# Patient Record
Sex: Male | Born: 1950 | Race: White | Hispanic: No | Smoking: Former smoker
Health system: Southern US, Community
[De-identification: ages and names within clinical notes are randomized; demographics above are authoritative.]

## PROBLEM LIST (undated history)

## (undated) DIAGNOSIS — I2510 Atherosclerotic heart disease of native coronary artery without angina pectoris: Secondary | ICD-10-CM

## (undated) DIAGNOSIS — I739 Peripheral vascular disease, unspecified: Secondary | ICD-10-CM

## (undated) DIAGNOSIS — J449 Chronic obstructive pulmonary disease, unspecified: Secondary | ICD-10-CM

## (undated) DIAGNOSIS — F17210 Nicotine dependence, cigarettes, uncomplicated: Secondary | ICD-10-CM

## (undated) DIAGNOSIS — E11319 Type 2 diabetes mellitus with unspecified diabetic retinopathy without macular edema: Secondary | ICD-10-CM

## (undated) DIAGNOSIS — I10 Essential (primary) hypertension: Secondary | ICD-10-CM

## (undated) DIAGNOSIS — C801 Malignant (primary) neoplasm, unspecified: Secondary | ICD-10-CM

## (undated) DIAGNOSIS — I219 Acute myocardial infarction, unspecified: Secondary | ICD-10-CM

## (undated) DIAGNOSIS — E785 Hyperlipidemia, unspecified: Secondary | ICD-10-CM

## (undated) HISTORY — PX: CARDIAC CATHETERIZATION: SHX172

## (undated) HISTORY — DX: Peripheral vascular disease, unspecified: I73.9

## (undated) HISTORY — DX: Nicotine dependence, cigarettes, uncomplicated: F17.210

## (undated) HISTORY — DX: Hyperlipidemia, unspecified: E78.5

## (undated) HISTORY — DX: Atherosclerotic heart disease of native coronary artery without angina pectoris: I25.10

## (undated) HISTORY — DX: Essential (primary) hypertension: I10

## (undated) HISTORY — PX: FRACTURE SURGERY: SHX138

---

## 1999-07-15 ENCOUNTER — Encounter: Admission: RE | Admit: 1999-07-15 | Discharge: 1999-10-13 | Payer: Self-pay | Admitting: Orthopedic Surgery

## 1999-08-05 ENCOUNTER — Encounter: Payer: Self-pay | Admitting: Orthopedic Surgery

## 1999-09-02 ENCOUNTER — Encounter: Payer: Self-pay | Admitting: Orthopedic Surgery

## 1999-09-30 ENCOUNTER — Encounter: Payer: Self-pay | Admitting: Orthopedic Surgery

## 1999-10-27 ENCOUNTER — Encounter: Admission: RE | Admit: 1999-10-27 | Discharge: 2000-01-25 | Payer: Self-pay | Admitting: Orthopedic Surgery

## 1999-11-25 ENCOUNTER — Encounter: Payer: Self-pay | Admitting: Orthopedic Surgery

## 2000-01-21 ENCOUNTER — Ambulatory Visit (HOSPITAL_COMMUNITY): Admission: RE | Admit: 2000-01-21 | Discharge: 2000-01-21 | Payer: Self-pay | Admitting: Internal Medicine

## 2000-03-01 ENCOUNTER — Encounter: Admission: RE | Admit: 2000-03-01 | Discharge: 2000-05-30 | Payer: Self-pay | Admitting: Orthopedic Surgery

## 2000-06-08 ENCOUNTER — Encounter: Admission: RE | Admit: 2000-06-08 | Discharge: 2000-09-06 | Payer: Self-pay | Admitting: Orthopedic Surgery

## 2000-09-15 ENCOUNTER — Encounter (HOSPITAL_BASED_OUTPATIENT_CLINIC_OR_DEPARTMENT_OTHER): Payer: Self-pay | Admitting: Internal Medicine

## 2000-09-15 ENCOUNTER — Encounter: Admission: RE | Admit: 2000-09-15 | Discharge: 2000-12-14 | Payer: Self-pay | Admitting: Internal Medicine

## 2001-04-25 ENCOUNTER — Emergency Department (HOSPITAL_COMMUNITY): Admission: EM | Admit: 2001-04-25 | Discharge: 2001-04-25 | Payer: Self-pay | Admitting: Emergency Medicine

## 2001-04-27 ENCOUNTER — Encounter: Admission: RE | Admit: 2001-04-27 | Discharge: 2001-07-11 | Payer: Self-pay | Admitting: Internal Medicine

## 2001-05-18 ENCOUNTER — Encounter (HOSPITAL_BASED_OUTPATIENT_CLINIC_OR_DEPARTMENT_OTHER): Payer: Self-pay | Admitting: Internal Medicine

## 2001-06-22 ENCOUNTER — Encounter (HOSPITAL_BASED_OUTPATIENT_CLINIC_OR_DEPARTMENT_OTHER): Payer: Self-pay | Admitting: Internal Medicine

## 2001-07-27 ENCOUNTER — Encounter: Admission: RE | Admit: 2001-07-27 | Discharge: 2001-08-23 | Payer: Self-pay | Admitting: Internal Medicine

## 2001-08-25 ENCOUNTER — Encounter: Admission: RE | Admit: 2001-08-25 | Discharge: 2001-11-23 | Payer: Self-pay | Admitting: Orthopedic Surgery

## 2001-09-14 ENCOUNTER — Encounter (HOSPITAL_BASED_OUTPATIENT_CLINIC_OR_DEPARTMENT_OTHER): Payer: Self-pay | Admitting: Internal Medicine

## 2001-10-26 ENCOUNTER — Encounter (HOSPITAL_BASED_OUTPATIENT_CLINIC_OR_DEPARTMENT_OTHER): Payer: Self-pay | Admitting: Internal Medicine

## 2001-11-30 ENCOUNTER — Encounter (HOSPITAL_BASED_OUTPATIENT_CLINIC_OR_DEPARTMENT_OTHER): Admission: RE | Admit: 2001-11-30 | Discharge: 2002-02-27 | Payer: Self-pay | Admitting: Internal Medicine

## 2001-11-30 ENCOUNTER — Ambulatory Visit (HOSPITAL_COMMUNITY): Admission: RE | Admit: 2001-11-30 | Discharge: 2001-11-30 | Payer: Self-pay | Admitting: Internal Medicine

## 2001-11-30 ENCOUNTER — Encounter (HOSPITAL_BASED_OUTPATIENT_CLINIC_OR_DEPARTMENT_OTHER): Payer: Self-pay | Admitting: Internal Medicine

## 2002-02-27 ENCOUNTER — Encounter (HOSPITAL_BASED_OUTPATIENT_CLINIC_OR_DEPARTMENT_OTHER): Admission: RE | Admit: 2002-02-27 | Discharge: 2002-03-03 | Payer: Self-pay | Admitting: Internal Medicine

## 2002-03-01 ENCOUNTER — Ambulatory Visit (HOSPITAL_COMMUNITY): Admission: RE | Admit: 2002-03-01 | Discharge: 2002-03-01 | Payer: Self-pay | Admitting: Internal Medicine

## 2002-03-01 ENCOUNTER — Encounter (HOSPITAL_BASED_OUTPATIENT_CLINIC_OR_DEPARTMENT_OTHER): Payer: Self-pay | Admitting: Internal Medicine

## 2002-05-24 ENCOUNTER — Encounter (HOSPITAL_BASED_OUTPATIENT_CLINIC_OR_DEPARTMENT_OTHER): Admission: RE | Admit: 2002-05-24 | Discharge: 2002-08-22 | Payer: Self-pay | Admitting: Internal Medicine

## 2002-08-28 ENCOUNTER — Encounter (HOSPITAL_BASED_OUTPATIENT_CLINIC_OR_DEPARTMENT_OTHER): Admission: RE | Admit: 2002-08-28 | Discharge: 2002-11-22 | Payer: Self-pay | Admitting: Internal Medicine

## 2003-02-05 ENCOUNTER — Encounter: Payer: Self-pay | Admitting: Orthopedic Surgery

## 2003-02-05 ENCOUNTER — Ambulatory Visit (HOSPITAL_COMMUNITY): Admission: RE | Admit: 2003-02-05 | Discharge: 2003-02-05 | Payer: Self-pay | Admitting: Orthopedic Surgery

## 2004-05-14 ENCOUNTER — Encounter (HOSPITAL_BASED_OUTPATIENT_CLINIC_OR_DEPARTMENT_OTHER): Admission: RE | Admit: 2004-05-14 | Discharge: 2004-08-12 | Payer: Self-pay | Admitting: Internal Medicine

## 2004-11-17 ENCOUNTER — Inpatient Hospital Stay (HOSPITAL_COMMUNITY): Admission: EM | Admit: 2004-11-17 | Discharge: 2004-11-19 | Payer: Self-pay | Admitting: Emergency Medicine

## 2005-04-13 ENCOUNTER — Emergency Department (HOSPITAL_COMMUNITY): Admission: EM | Admit: 2005-04-13 | Discharge: 2005-04-13 | Payer: Self-pay | Admitting: Internal Medicine

## 2007-08-01 ENCOUNTER — Ambulatory Visit: Payer: Self-pay | Admitting: Surgery

## 2009-03-21 ENCOUNTER — Ambulatory Visit: Payer: Self-pay | Admitting: Surgery

## 2009-06-18 DIAGNOSIS — Z720 Tobacco use: Secondary | ICD-10-CM | POA: Diagnosis present

## 2009-06-27 DIAGNOSIS — I6529 Occlusion and stenosis of unspecified carotid artery: Secondary | ICD-10-CM | POA: Insufficient documentation

## 2009-10-11 DIAGNOSIS — G629 Polyneuropathy, unspecified: Secondary | ICD-10-CM | POA: Insufficient documentation

## 2010-04-21 ENCOUNTER — Ambulatory Visit (HOSPITAL_COMMUNITY)
Admission: RE | Admit: 2010-04-21 | Discharge: 2010-04-21 | Payer: Self-pay | Admitting: Physical Medicine and Rehabilitation

## 2011-01-06 NOTE — Procedures (Signed)
CAROTID DUPLEX EXAM   INDICATION:  Right carotid bruit.   HISTORY:  Diabetes:  Yes.  Cardiac:  No.  Hypertension:  No.  Smoking:  Yes.  Previous Surgery:  No.  CV History:  No.  Amaurosis Fugax No, Paresthesias No, Hemiparesis No                                       RIGHT             LEFT  Brachial systolic pressure:         124               122  Brachial Doppler waveforms:         WNL               WNL  Vertebral direction of flow:        Antegrade         Antegrade  DUPLEX VELOCITIES (cm/sec)  CCA peak systolic                   94                120  ECA peak systolic                   111               110  ICA peak systolic                   72                63  ICA end diastolic                   24                23  PLAQUE MORPHOLOGY:                  Calcified         None  PLAQUE AMOUNT:                      Mild              None  PLAQUE LOCATION:                    Bif/ICA           None    IMPRESSION:  1. 20-39% right internal carotid artery stenosis.  2. Patent left internal carotid artery with no evidence of stenosis.         ___________________________________________  V. Charlena Cross, MD   AC/MEDQ  D:  03/21/2009  T:  03/21/2009  Job:  [id number]

## 2011-01-06 NOTE — Assessment & Plan Note (Signed)
OFFICE VISIT   Burgio, Foye L  DOB:  November 14, 1950                                       08/01/2007  ZOXWR#:[id number]   REASON FOR CONSULTATION:  Claudication.   HISTORY:  This is a 60 year old gentleman who I am seeing at the request  of Dr. Timothy Lasso for bilateral lower extremity claudication.  Patient states  that he experiences burning in his left calf at approximately 200 yards.  He also complains of cramping in his calves at the end of the day and  occasionally in the middle of the night.  He denies having any ulcers.  He does not have slow-healing wounds.  He denies having any rest pain.   The patient has been a type 1 diabetic.  Age of onset was 13 years.  He  has been on an insulin pump.  His blood sugars have been well  controlled.  He has had one heart attack, approximately three years ago.  He has not had any chest pain since that time.   REVIEW OF SYSTEMS:  GENERAL:  No weight gain, no weight loss.  CARDIAC:  Negative.  PULMONARY:  Negative.  GI:  Negative.  GU:  Negative.  VASCULAR:  Pain in legs with walking.  NEURO:  Positive for headaches.  ORTHO:  Positive for muscle pain.  PSYCH:  Negative.  ENT:  Negative.  HEME:  Negative.   PAST MEDICAL HISTORY:  Significant for diabetes and a history of MI.   PAST SURGICAL HISTORY:  Left arm surgery.   FAMILY HISTORY:  Negative.   SOCIAL HISTORY:  He is married.  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  He currently  smokes.  He does not drink alcohol.   MEDICATIONS:  1. Folic acid 400 mcg twice daily.  2. Baby aspirin once daily.  3. Ramipril 10 mg once daily.  4. Metoprolol XL 25 mg once daily.  5. Simvastatin 40 mg once daily.  6. NovoLog insulin pump.   ALLERGIES:  No known drug allergies.   PHYSICAL EXAMINATION:  General:  He is well-appearing in no acute  distress.  HEENT:  He is normocephalic and atraumatic.  Pupils are  equal.  Sclerae are anicteric.  Neck:  There is no JVP, no JVD, no  carotid bruits, no mass.  Neck is supple.  Cardiovascular:  Regular rate  and rhythm without murmurs, rubs or gallops.  Pulmonary:  Lungs are  clear to auscultation bilaterally.  Abdomen is soft and nontender.  No  hepatosplenomegaly.  Neuro:  Cranial nerves II-XII are grossly intact.  Psych:  He is alert and oriented x3.  Extremities:  Warm and well  perfused.  There are palpable femoral pulses bilaterally.  I cannot feel  popliteal pulses.  There is a right dorsalis pedis.  I was unable to  feel a left dorsalis pedis.  No ulceration.  No infection.   DIAGNOSTIC STUDIES:  I reviewed the outside duplex study, which reveals  an ankle brachial index of 1.08 on the right and 1.08 on the left.  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  The report states  that the right popliteal artery demonstrated normal patency with three  vessel runoff and 50% diameter-reducing stenosis in the posterior tibial  artery.  The left popliteal demonstrated normal patency with two vessel  runoff with posterior tibial  occluded.   ASSESSMENT/PLAN:  1. Bilateral leg pain in a type I diabetic.  I spoke extensively with      the patient, saying that his symptoms are not classic for      claudication, he does have some evidence of disease on his duplex;      however, this appears to be tibial disease, and based on his      symptoms and location of his disease, I would not intervene at this      time.  We discussed the classic symptoms for claudication and rest      pain, and he really does not endorse these, certainly not at a      short distance.  He states that his symptoms, which mainly are      burning, occur at approximately 200 yards.  I did elect to place      him on cilostazol to see if this has any improvement in his      symptoms.  He will try this and continue it if it helps.      Otherwise, I plan on seeing him back in three months for a repeat      duplex evaluation and further  discussions.  2. We talked about continued preventative treatment of his disease,      which would include smoking cessation.  We discussed the importance      of smoking cessation on the disease process, which is involved and      which he has.  We also discussed starting an exercise program and      what that would involve.  Again, the patient will see me back in      three months.   Jorge Ny, MD  Electronically Signed   VWB/MEDQ  D:  08/01/2007  T:  08/02/2007  Job:  [id number]   cc:   [name], MD

## 2011-01-09 NOTE — Consult Note (Signed)
[name], [name]               ACCOUNT NO.:  [id number]   MEDICAL RECORD NO.:  [id number]          PATIENT TYPE:  EMS   LOCATION:  [facility]                         FACILITY:  [facility]   PHYSICIAN:  Elmore Guise., M.D.DATE OF BIRTH:  [birth year]   DATE OF CONSULTATION:  04/13/2005  DATE OF DISCHARGE:  04/13/2005                                   CONSULTATION   REFERRING PHYSICIAN:  Dr. Romeo Rabon   Primary Physician  Dr. [name]   HISTORY OF PRESENT ILLNESS:  The patient is a 60 year old white male with  past medical history of diabetes mellitus, hypertension, nonobstructive  coronary disease by cardiac catheterization on November 18, 2004, who presented  to the emergency room with my heart racing.  The patient was doing his  normal activities today when he started having trouble with his loading  machine.  He reports that he became upset.  He then had to switch machines  because it was having difficulty loading the trees and lumber.  After he  switched, he reports that he was hot and flushed and then I felt my heart  racing.  He stated, It was as if it was going to jump out of my chest.  He had no significant chest pain before this episode and was actually doing  okay.  He went to sit down and rest; his heart continued to race; EMS was  called.  On arrival of EMS, he was found to be in SVT with a heart rate of  190 beats per minute.  He was given adenosine 60 mg IV with total resolution  of his symptoms.  He was then brought to the ER for observation and further  evaluation.  He actually has no significant complaints at this time.  He  says, I've been doing okay, I have mild dyspnea on exertion, but nothing  significant.  He has had no significant chest pain or pressure.  He has  been taking his medications as normal.  He does continue to smoke and he has  done so for some time.  He smokes between 1-2 packs per day.  He has not  taken any stimulants, does not have any fever,  no significant pain.  No  significant alcohol intake.  No over-the-counter new medications.   REVIEW OF SYSTEMS:  His review of systems is otherwise negative.   CURRENT MEDICATIONS:  1.  Insulin pump.  2.  Folic acid.  3.  Toprol-XL 12.5 mg daily.  4.  Plavix 75 mg daily.  5.  Lipitor 20 mg daily.  6.  Aspirin 81 mg daily.  7.  Altace 10 mg daily.   ALLERGIES:  None.   FAMILY HISTORY:  Positive for coronary disease.   SOCIAL HISTORY:  He is married, smokes 2 packs per day, is self-employed  with his landscaping business.   PHYSICAL EXAMINATION:  VITAL SIGNS:  He is afebrile.  Temperature is 98.4,  heart rate is 81, blood pressure is 116/63.  He is saturating 95% on room  air.  GENERAL:  He is a very pleasant middle-aged white  male, alert and oriented  x4, in no acute distress.  NECK:  Supple.  No significant lymphadenopathy.  Carotids 2+.  No JVD.  LUNGS:  Clear.  HEART:  Regular with a normal S1 and S2.  No significant murmurs, gallops or  rubs.  ABDOMEN:  Soft, nontender and non-distended.  EXTREMITIES:  Warm with trace peripheral pulses and no significant edema.   LABORATORY WORK:  His lab work shows a BUN and creatinine of 16 and 1.3.  Potassium is 4.0.  Hemoglobin is 13.9.  Troponin I is less than 0.05, MB is  1.4, myoglobin is 85.   We have 3 ECGs for evaluation.  His first ECG shows normal sinus rhythm,  normal axis, normal intervals with no significant ST or T wave changes.  His  rate is approximately 96 beats per minute.  No significant changes from his  prior ECG.  EKG done approximately 30 minutes later shows normal sinus  rhythm, normal axis, normal intervals, no significant ST or T wave changes.  His rate was 93 beats per minute.  EKG done again 30 minutes later from that  shows normal sinus rhythm, normal axis, normal intervals, no significant ST-  T wave changes, rate of 81 beats per minute, no significant changes from his  prior ECGs.   IMPRESSION:  1.   Supraventricular tachycardia.  2.  History of nonobstructive coronary arteries by cardiac catheterization,      November 18, 2004.  At that time, he had a normal left main, left anterior      descending with ostial 20% stenosis with mild mid and distal luminal      irregularities.  The first diagonal was a small vessel with no      significant disease.  The second diagonal was a moderate-sized vessel      with ostial 40% stenosis.  The circumflex is nondominant with mild      luminal irregularities.  Right coronary artery was dominant with mild-to-      moderate diffuse calcification with a proximal 40% stenosis followed by      mid 30% to 40% stenosis and distal 50% to 60% stenosis.  Posterior      descending artery and preserved left ventricle had no significant      disease and ejection fraction was 55% to 60%.  3.  Ongoing tobacco abuse.  4.  Diabetes and dyslipidemia.   PLAN:  From a cardiovascular standpoint, the patient is very stable.  His  blood work is negative and his ECG shows no significant changes.  He is  currently in normal sinus rhythm and feels back to normal.  We will  discharge him back to home with the following recommendations:  He will  increase his Toprol from 12.5 to 25 mg daily.  I did discuss vagal maneuvers  with him at length.  He will come by the office tomorrow for a Holter  monitor and I gave him metoprolol short-acting, 25 mg, to take on a p.r.n.  basis.  I did discuss with him to stay real hydrated, try to decrease his  caffeine and nicotine intake and follow up with me in the office in  approximately 2 weeks.  He is to call should he have any further problems.      Elmore Guise., M.D.  Electronically Signed     TWK/MEDQ  D:  04/13/2005  T:  04/14/2005  Job:  [id number]   cc:   [name], MD  Fax: [phone]

## 2011-01-09 NOTE — H&P (Signed)
NAMEDONEL, OSOWSKI               ACCOUNT NO.:  [id number]   MEDICAL RECORD NO.:  [id number]          PATIENT TYPE:  INP   LOCATION:  2034                         FACILITY:  [facility]   PHYSICIAN:  [name], MD       DATE OF BIRTH:  Oct 27, 1950   DATE OF ADMISSION:  11/17/2004  DATE OF DISCHARGE:                                HISTORY & PHYSICAL   CHIEF COMPLAINT:  Diabetic ketoacidosis.   HISTORY OF PRESENT ILLNESS:  A 60 year old male who has been type 1 diabetic  since the 7th grade and never had a DKA episode.  He recently was changed  from subcutaneous insulin shots to the Animus pump.  On Sunday a.m. he  changed the site and after which he noted increased blood sugars to greater  than 400 and 500 despite aggressive bolusing.  This a.m., he awoke with  nausea and vomiting, no abdominal pain, dry mouth, and did eventually  develop some chest tightness.  No other symptoms and shortness of breath.  He called the office and asked what should be done and we sent him to the  emergency room where he was found to be in diabetic ketoacidosis.  He was  given IV fluids, Zofran, and I was called to admit.  We gave him some IV  insulin and he eventually was placed on the Glucomander.   PAST MEDICAL HISTORY:  Type 1 diabetes since the age of 74, retinopathy and  neuropathy, tobacco abuse, bone graft from the right hip to the left arm,  left shoulder arthroscopy, hypoglycemic unawareness.   ALLERGIES:  No known drug allergies.   MEDICATIONS:  1.  Animus pump using Novolog.  2.  Baclofen 20 q.h.s. p.r.n.  3.  Folic acid.  4.  Aspirin.  5.  Altace 10 daily.   SOCIAL HISTORY:  He is married, self-employed, and smokes one to two packs  per day.   FAMILY HISTORY:  Coronary artery disease and myocardial infarction.   REVIEW OF SYSTEMS:  Dry mouth, no fever, no bowel or bladder issues.  Chest  pain has resolved by the time I saw him in the emergency room.  No shortness  of breath.  No  other symptoms.  All other organs and systems reviewed  negative.   PHYSICAL EXAMINATION:  VITAL SIGNS:  Temperature 97.1, blood pressure  115/62, heart rate 93, respiratory rate 16, saturating 97% on room air.  GENERAL:  Alert and oriented x3.  HEART:  Regular.  LUNGS:  Clear to auscultation bilaterally.  ABDOMEN:  Soft and nontender, and nondistended.  Bowel sounds positive.  Very thin.  HEENT:  PERRL, EOMI. Oropharynx is dry.  EXTREMITIES:  No cyanosis, clubbing, or edema.  Left great toe with a small  blister at the DIP joint.  No cellulitis noted.   LABORATORY DATA:  Magnesium 2.4, CK and troponin and myoglobin were negative  x2.  INR 1.  White count 18.8, hemoglobin 14.2, platelet count 367 with 89%  segs.  The pH 7.223, pCO2 30, pO2 90. Sodium 135, potassium 5.8, chloride  101, bicarb 14, BUN  41, creatinine 1.7, glucose 522.  LFT's are fine except  for albumin 1.9.   EKG; normal sinus rhythm, right atrial enlargement, no acute disease.   Portable chest x-ray; no acute disease.   ASSESSMENT:  Dehydration, diabetic ketoacidosis, and atypical chest pain.   PLAN:  1.  Admit.  2.  Glucomander to gain control over the blood sugars.  IV fluids      replacement for dehydration.  Reason for this might be site failure.  He      have had a kink in the line.  The site may not have been inserted      correctly.  I do not recognize any underlying source of infection, but      will check urinalysis and blood cultures.  3.  Will rule out MI.  Will continue with the CK and troponins and enzymes      so far back are negative.  EKG is negative.  May need cardiology if      things change.  4.  Will watch the potassium.  Will make sure that we close the anion gap.  5.  I have already advised the patient multiple times on quitting tobacco.  6.  On review of the chart, the patient did have a stress test, Bruce      protocol, back in May of 2001 which was negative and consider adequate       submaximal stress test performed by his primary care doctor.  Maximum      heart rate was 155.  Also had a Bruce protocol stress test back in 1998      which was also negative.      JMR/MEDQ  D:  11/17/2004  T:  11/18/2004  Job:  [id number]

## 2011-01-09 NOTE — Consult Note (Signed)
NAMEARTHURO, CANELO               ACCOUNT NO.:  [id number]   MEDICAL RECORD NO.:  [id number]          PATIENT TYPE:  INP   LOCATION:  2034                         FACILITY:  [facility]   PHYSICIAN:  Elmore Guise., M.D.DATE OF BIRTH:  1950/08/31   DATE OF CONSULTATION:  11/18/2004  DATE OF DISCHARGE:                                   CONSULTATION   REASON FOR CONSULTATION:  Chest tightness, abnormal ECG, and elevated  troponin.   PRIMARY CARE PHYSICIAN:  Dr. [name]   HISTORY OF PRESENT ILLNESS:  Patient is a very pleasant 60 year old white  male, past medical history of diabetes mellitus since age 80, hypertension,  ongoing tobacco abuse who was admitted with DKA with blood sugars in the 400-  500 range.  Patient reported he awoke with nausea, vomiting, dry mouth, and  chest tightness.  This continued throughout the day.  His blood sugars were  extremely elevated.  On arrival to the hospital patient reported almost  immediate improvement in his symptoms after his blood sugars started to  improve with IV hydration and intravenous insulin.  Patient denies any  exertional chest pain.  Does have mild exertional dyspnea.  No orthopnea.  No PND.  No significant lower extremity edema.  Does report occasional  cramping in his legs at the end of the day.  Otherwise, he has been on the  insulin pump and doing well.  He believes his elevated blood sugars were  from a kink in the line.  No recent fever.  Does have productive cough  primarily in the morning which he thinks is secondary to his smoking.   CURRENT MEDICATIONS:  1.  Altace.  2.  Insulin pump.  3.  Folic acid.   ALLERGIES:  None.   FAMILY HISTORY:  Positive for coronary disease in his mother.   SOCIAL HISTORY:  He is married.  Smokes one to two packs per day and has  done so for the last 25 years.  Has two grown children.  Is self employed in  [employer].   PHYSICAL EXAMINATION:  VITAL SIGNS:  He is  afebrile.  Temperature is 98.5,  blood pressure 110/70, heart rate 70 and regular, respirations 20,  saturating 98% on room air.  GENERAL:  He is a very pleasant, middle-aged white male, alert and oriented  x4, in no acute distress.  NECK:  Supple.  No lymphadenopathy.  2+ carotids.  No JVD.  No bruits.  LUNGS:  Clear with prolonged expiratory phase.  HEART:  Regular with no significant murmurs, rubs, or gallops.  ABDOMEN:  Soft, nontender, nondistended.  EXTREMITIES:  Warm with trace pedal pulses and 1+ femoral pulse.  NEUROLOGIC:  Intact.  No focal deficits.   LABORATORIES:  BUN and creatinine of 32 and 0.9, potassium 3.7.  Hemoglobin  11.4, white blood cell count 13.9, platelets 286.  Troponin I was 2.07  increasing to 2.26.  CPK of 227 increasing to 247, MB of 15.2 and 13.6.  Chest x-ray shows no acute cardiopulmonary disease.  ECG shows normal sinus  rhythm 71 per minute  with J-point elevation/ST elevation V2-V5 which is  different from his earlier ECG.  Patient currently chest pain-free, however.   IMPRESSION:  1.  Recent myocardial infarction with elevated troponin in 2-2.5 range.      Abnormal electrocardiogram.  2.  Multiple cardiac risk factors including diabetes, hypertension, ongoing      tobacco use.   PLAN:  From cardiovascular standpoint I do agree with current management  including aspirin and Lovenox.  I discussed basic work-up including  catheterization with early intervention if needed.  Patient consents and  elects to proceed.  Following catheterization I would recommend low dose  beta blocker, aspirin, and smoking cessation.  2B3A if __________.      TWK/MEDQ  D:  11/18/2004  T:  11/18/2004  Job:  [id number]   cc:   [name], MD  Fax: [phone]

## 2011-01-09 NOTE — H&P (Signed)
[name], [name]               ACCOUNT NO.:  [id number]   MEDICAL RECORD NO.:  [id number]          PATIENT TYPE:  INP   LOCATION:  2034                         FACILITY:  [facility]   PHYSICIAN:  [name], MD       DATE OF BIRTH:  September 10, 1950   DATE OF ADMISSION:  11/17/2004  DATE OF DISCHARGE:                                HISTORY & PHYSICAL   CHIEF COMPLAINT:  DKA.   HISTORY OF PRESENT ILLNESS:  A 60 year old male who has been diabetic type 1  since the 7th grade, who has never had DKA. He has recently been changed  from insulin shots to the pump. On Sunday a.m., he changed his site and  afterwards he noticed increased blood sugars to greater than 400 and 500  despite aggressive bolusing. This a.m. he woke with nausea, vomiting, no  abdominal pain, but positive dry mouth, and some chest tightness. No  shortness of breath and no other symptoms. He called my office, asked what  to do, and I sent him on to the emergency department. In the ER, he was  given aggressive IV fluids, Zofran, and given orders for IV insulin. I was  called for inpatient admission.   PAST MEDICAL HISTORY:  1.  Type diabetes since the age of 89.  2.  Retinopathy and neuropathy.  3.  Tobacco abuse.  4.  Bone grafts from the right hip to the arm.  5.  Left shoulder arthroscopy.  6.  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   ALLERGIES:  No known drug allergies.   MEDICATIONS:  1.  Pump using Novolog.  2.  Baclofen 20 mg p.o. q.h.s. p.r.n.  3.  Folic acid.  4.  Aspirin q.d.  5.  Altace 10 mg q.d.   SOCIAL HISTORY:  He is married. He is self-employed. Smokes one to two packs  per day if not more.   FAMILY HISTORY:  Coronary disease and myocardial infarction.   REVIEW OF SYMPTOMS:  Dry mouth. No fever. No bowel or bladder issues. See  HPI for further details. The chest pain is resolved in the emergency  department. Initially on arrival, it was felt to be a potential acute  MI.  The first two sets of enzymes were negative. EKG was negative and then  further evaluation revealed a diabetic ketoacidosis. The patient reports and  I discussed all other organ systems and these were negative.   PHYSICAL EXAMINATION:  VITAL SIGNS:  Temperature 97.1, heart rate 93, blood  pressure 115/62, respiratory rate 16. Saturating 97% on room air.  GENERAL:  Alert and oriented x 3.  CARDIOVASCULAR:  Regular.  PULMONARY:  Clear to auscultation bilaterally.  ABDOMEN:  Soft, nontender, and nondistended. Bowel sounds positive.  HEENT:  PERRL. EOMI. Oropharynx is dry.  SKIN:  Abdomen as well as rest of the body is thin.  EXTREMITIES:  No clubbing, cyanosis, or edema. Left great toe with small  blister at the IP joint. No cellulitis noted.   LABORATORY DATA:  Magnesium 2.4. Enzymes negative x 2 including myoglobin  and troponin I. INR is 1.0, white count is 18.8, hemoglobin 14.2, platelet  count 367,000 with 89% segs. A pH 7.223, pO2 30,   Dictation ended at this point.      JMR/MEDQ  D:  11/17/2004  T:  11/17/2004  Job:  [id number]

## 2011-01-09 NOTE — Discharge Summary (Signed)
[name], [name]               ACCOUNT NO.:  [id number]   MEDICAL RECORD NO.:  [id number]          PATIENT TYPE:  INP   LOCATION:  2034                         FACILITY:  [facility]   PHYSICIAN:  [name], MD       DATE OF BIRTH:  1951-04-22   DATE OF ADMISSION:  11/17/2004  DATE OF DISCHARGE:  11/19/2004                                 DISCHARGE SUMMARY   CARDIOLOGIST:  Rosine Abe, M.D.   DISCHARGE DIAGNOSES:  1.  Diabetic ketoacidosis.  2.  Diabetes mellitus type 1.  3.  Acute myocardial infarction secondary to #1 with a peak troponin of 2.  4.  Tobacco abuse.  5.  Retinopathy.  6.  Neuropathy.  7.  History of bone graft from the right hip to left arm.  8.  Left shoulder arthroscopy.  9.  Hypoglycemia unawareness.   DISCHARGE PROCEDURES:  Cardiac catheterization which revealed nonobstructive  coronary artery disease.   DISCHARGE MEDICATIONS:  1.  Novolog by [facility] pump.  2.  Folic acid.  3.  Toprol XL 25 mg one-half p.o. daily.  4.  Plavix 75 mg p.o. daily.  5.  Lipitor 20 mg p.o. daily.  6.  Aspirin 81 daily.  7.  Altace 10 mg p.o. daily.   HISTORY OF PRESENT ILLNESS:  Briefly, Mr. [name] is a 60 year old  male who has had type 1 diabetes since the seventh grade who has never had  DKA.  He was recently changed from insulin shots to his [place] pump.  On  Sunday a.m. he changed his site, after which he noticed increasing CBGs to  the 4 and 500 range despite aggressive bolusing.  This a.m. he awoke with  nausea, vomiting, no abdominal pain, severe dry mouth, and chest tightness.  No other symptoms.  In the ER he was given IV fluids, Zofran, and I was  called to admit.  He as diagnosed with DKA.   HOSPITAL COURSE:  Mr. [name] was admitted through the emergency  department in diabetic ketoacidosis and with chest pain.  He as admitted to  the telemetry bed, was placed on aggressive IV fluids, IV insulin, and  laboratory data was obtained to try to  rule out myocardial infarction.  There was no obvious sources of the underlying diabetic ketoacidosis.  He  corrected his gap in several hours after he had been put on the Glucomander  and he did very well and his hunger came back and he was given food.  He was  seen by diabetic education the following day and we came to realize that his  insertion site had a kink in it and the insulin boluses that he was giving  himself and the insulin basals were just not getting into him, therefore he  was not getting any insulin and that he is why he went into diabetic  ketoacidosis.  Re-education was given.   Unfortunately, he ruled in for a small enzyme leak related myocardial  infarction probably related to stress and diabetic ketoacidosis and because  this was felt to be a positive stress  test or a small MI cardiology was  consulted.  Dr. Reyes Ivan saw him and elected to take him to the cath lab.  Catheterization revealed nonobstructive coronary disease with 20% ostial LAD  stenosis, 40-50% second diagonal stenosis, and 40-50% stenosis of the RCA  proximally and up to 60% distally.  EF was 50-60%.  Pressures were normal.   PLAN:  Discharge the patient on aspirin, Plavix, ACE inhibitor, beta-  blocker, and statin, and aggressive smoking cessation.   The patient's dehydration was corrected.  He had laid on his back  appropriately after his cath.  His diabetes had been corrected and he was  switched over from the Glucomander to the [place] pump.  It was infusing  correctly and we elected to manage him medically.  On the afternoon of November 18, 2004, we elected to discharge him to the outpatient setting and we would  further manage and tighten up his diabetic control as an outpatient.  He was  discharged in stable condition.   AFTERCARE FOLLOWUP:  He will followup with myself in about 2-3 weeks.  He  will followup with Dr. Reyes Ivan in two weeks for an office visit.  He will  followup with Wynne Dust,  our diabetic educator.  He will call with any  questions or concerns and we will attempt to get his diabetes under very  very tight control and give him much more knowledge and ability to manage  the self pump.  The information was taught to him, but again because of how  much information there is at first I do not think he picked up on site  changes and some of the issues that can occur.      JMR/MEDQ  D:  11/21/2004  T:  11/21/2004  Job:  [id number]

## 2011-01-09 NOTE — Cardiovascular Report (Signed)
[name], [name]               ACCOUNT NO.:  [id number]   MEDICAL RECORD NO.:  [id number]          PATIENT TYPE:  INP   LOCATION:  2034                         FACILITY:  [facility]   PHYSICIAN:  Elmore Guise., M.D.DATE OF BIRTH:  1950-12-14   DATE OF PROCEDURE:  11/18/2004  DATE OF DISCHARGE:                              CARDIAC CATHETERIZATION   INDICATIONS FOR PROCEDURE:  Chest pain, abnormal troponin and ECG.   DESCRIPTION OF PROCEDURE:  Patient brought to the cardiac catheterization  lab after appropriate informed consent.  He is prepped and draped in sterile  fashion.  Approximately 15 mL of 1% lidocaine was used for local anesthesia.  A 6 French sheath was placed in the right femoral artery without difficulty.  Coronary angiography, LV angiography and limited right femoral angiography  were then performed.   RESULTS:  1.  Left main:  Mild to moderate calcification.  No significant obstructive      disease.  2.  LAD:  Mild to moderate calcification.  Ostial 20% stenosis with mild mid      and distal luminal irregularities.  3.  First diagonal:  Small vessel.  No significant obstructive disease.  4.  Second diagonal is moderate size vessel with an ostial 40 to 50%      stenosis.  5.  Left circumflex:  Nondominant with mild luminal irregularities.  6.  OM I:  Large vessel with mild luminal irregularities.  7.  RCA:  Dominant with mild to moderate diffuse calcification, proximal 40      to 50% stenosis followed by mid 30 to 40% stenosis and distal 50 to 60%      stenosis.  PDA and PLV have no significant disease.  8.  LV:  EF is 55 to 60%.  No wall motion abnormalities.  LVEDP was 17 mmHg.  9.  Right femoral:  Mild to moderate luminal irregularities.  No significant      obstructive disease.   IMPRESSION:  1.  Nonobstructive coronaries as described.  2.  Preserved left ventricular systolic function with an ejection fraction      of 55 to 60% and no wall motion  abnormalities.   PLAN:  1.  Aggressive medical management including aspirin, Plavix, Altace,      Lopressor, statin.  2.  Smoking cessation.  3.  If patient continues with chest pain, I would recommend stress test to      evaluate for reversible ischemia in the inferior wall consistent with      his nonobstructive disease as described.     TWK/MEDQ  D:  11/18/2004  T:  11/18/2004  Job:  [id number]   cc:   [name], MD  Fax: [phone]

## 2011-03-02 ENCOUNTER — Other Ambulatory Visit: Payer: Self-pay | Admitting: Cardiovascular Disease

## 2011-03-02 MED ORDER — METOPROLOL SUCCINATE ER 25 MG PO TB24: 25.0000 mg | ORAL_TABLET | Freq: Every day | ORAL | Status: DC

## 2011-03-02 NOTE — Telephone Encounter (Signed)
Patient needs refill of toprol xl 25mg .  Please call.  Made appt for patient 04/15/2011.

## 2011-03-02 NOTE — Telephone Encounter (Signed)
Left msg will call in script to get him to his next app.Alfonso Ramus RN

## 2011-04-07 ENCOUNTER — Encounter: Payer: Self-pay | Admitting: Cardiovascular Disease

## 2011-04-12 ENCOUNTER — Other Ambulatory Visit: Payer: Self-pay | Admitting: Cardiovascular Disease

## 2011-04-13 ENCOUNTER — Other Ambulatory Visit: Payer: Self-pay | Admitting: *Deleted

## 2011-04-13 MED ORDER — METOPROLOL SUCCINATE ER 25 MG PO TB24: 25.0000 mg | ORAL_TABLET | Freq: Every day | ORAL | Status: DC

## 2011-04-13 NOTE — Telephone Encounter (Signed)
Fax received from pharmacy. Refill completed. Jodette Emmons Toth RN  

## 2011-04-15 ENCOUNTER — Ambulatory Visit (INDEPENDENT_AMBULATORY_CARE_PROVIDER_SITE_OTHER): Payer: BC Managed Care – PPO | Admitting: Cardiovascular Disease

## 2011-04-15 ENCOUNTER — Encounter: Payer: Self-pay | Admitting: Cardiovascular Disease

## 2011-04-15 DIAGNOSIS — I739 Peripheral vascular disease, unspecified: Secondary | ICD-10-CM | POA: Insufficient documentation

## 2011-04-15 DIAGNOSIS — I2510 Atherosclerotic heart disease of native coronary artery without angina pectoris: Secondary | ICD-10-CM

## 2011-04-15 DIAGNOSIS — E785 Hyperlipidemia, unspecified: Secondary | ICD-10-CM | POA: Insufficient documentation

## 2011-04-15 NOTE — Assessment & Plan Note (Signed)
Had a cardiac catheterization 2006 which revealed mild to moderate coronary artery irregularities. He has not had any angina.

## 2011-04-15 NOTE — Progress Notes (Signed)
[name] Date of Birth  1951/04/17 [facility] [address]     [address] [place], [place]  [address] [phone]  Fax  [phone]  History of Present Illness:  [name] is a 60 year old gentleman with a history of coronary artery disease and peripheral vascular disease he also has a history of dyslipidemia.  He denies any chest pain or shortness breath. 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  Has a history of hypercholesterolemia. He was on simvastatin previous the. It caused severe muscle aches such that he was unable to walk. He discontinued it.  Current Outpatient Prescriptions on File Prior to Visit  Medication Sig Dispense Refill  . aspirin 81 MG tablet Take 81 mg by mouth daily.        . folic acid (FOLVITE) 800 MCG tablet Take 400 mcg by mouth daily.        . Insulin Human (INSULIN PUMP) 100 unit/ml SOLN Inject into the skin daily.        . metoprolol succinate (TOPROL XL) 25 MG 24 hr tablet Take 1 tablet (25 mg total) by mouth daily.  30 tablet  1  . ramipril (ALTACE) 10 MG tablet Take 10 mg by mouth daily.          No Known Allergies  Past Medical History  Diagnosis Date  . Coronary artery disease   . Peripheral vascular disease   . Diabetes mellitus   . Dyslipidemia   . Cigarette smoker     Past Surgical History  Procedure Date  . Cardiac catheterization     EF is 55-60% and no wall motion abnormalities    History  Smoking status  . Current Everyday Smoker -- 1.0 packs/day  Smokeless tobacco  . Not on file    History  Alcohol Use     Family History  Problem Relation Age of Onset  . Coronary artery disease Mother     Reviw of Systems:  Reviewed in the HPI.  All other systems are negative.  Physical Exam: BP 102/54  Pulse 78  Ht 5\' 8"  (1.727 m)  Wt 156 lb (70.761 kg)  BMI 23.72 kg/m2 The patient is alert and oriented x 3.  The mood and affect are normal.   Skin: warm and dry.  Color is normal.     HEENT:   the sclera are nonicteric.  The mucous membranes are moist.  The carotids are 2+ without bruits.  There is no thyromegaly.  There is no JVD.    Lungs: clear.  The chest wall is non tender.    Heart: regular rate with a normal S1 and S2.  There are no murmurs, gallops, or rubs. The PMI is not displaced.     Abdomen: good bowel sounds.  There is no guarding or rebound.  There is no hepatosplenomegaly or tenderness.  There are no masses.   Extremities:  no clubbing, cyanosis, or edema.  The legs are without rashes.  The distal pulses are intact.   Neuro:  Cranial nerves II - XII are intact.  Motor and sensory functions are intact.    The gait is normal.  ECG:  Assessment / Plan:

## 2011-04-15 NOTE — Assessment & Plan Note (Addendum)
We will check his lipid levels again in 6 months. He was on simvastatin but had to stop because of severe muscle aches. He will try to cut down on his red meat we'll try to eat a little bit more fish.  I have recommended that he eat more fish  and try fish oil capsules.

## 2011-04-21 ENCOUNTER — Telehealth: Payer: Self-pay | Admitting: Cardiology

## 2011-04-21 NOTE — Telephone Encounter (Signed)
No note is required for this encounter.

## 2011-06-29 ENCOUNTER — Other Ambulatory Visit: Payer: Self-pay | Admitting: Cardiovascular Disease

## 2011-10-13 DIAGNOSIS — E119 Type 2 diabetes mellitus without complications: Secondary | ICD-10-CM

## 2011-10-13 DIAGNOSIS — E113599 Type 2 diabetes mellitus with proliferative diabetic retinopathy without macular edema, unspecified eye: Secondary | ICD-10-CM | POA: Insufficient documentation

## 2011-12-22 ENCOUNTER — Other Ambulatory Visit: Payer: Self-pay | Admitting: Cardiovascular Disease

## 2012-02-23 ENCOUNTER — Telehealth: Payer: Self-pay | Admitting: Cardiovascular Disease

## 2012-02-23 NOTE — Telephone Encounter (Signed)
New msg Pt's father called and said he wanted a nurse to call pt back. He couldn't tell me why

## 2012-02-23 NOTE — Telephone Encounter (Signed)
Pt called to report that his wife saw Dr Eustaquio Maize and that Dr Eustaquio Maize is requesting that Dr Elease Hashimoto call him.  I told Mr Albarran that Dr Elease Hashimoto is not in the office this week.  Mr Leu is going to check with Dr Eustaquio Maize to find out if he needs to speak with the on-call cardiologist or if Dr Eustaquio Maize wants to wait for Dr Elease Hashimoto to come back in the office.  He will call back and let us know.  Our office phone number was provided if Dr Eustaquio Maize wishes to speak with the dod.

## 2012-07-20 ENCOUNTER — Other Ambulatory Visit: Payer: Self-pay | Admitting: Cardiology

## 2012-07-20 MED ORDER — METOPROLOL SUCCINATE ER 25 MG PO TB24: 25.0000 mg | ORAL_TABLET | Freq: Every day | ORAL | Status: DC

## 2013-01-23 ENCOUNTER — Ambulatory Visit (INDEPENDENT_AMBULATORY_CARE_PROVIDER_SITE_OTHER): Payer: BC Managed Care – PPO | Admitting: Emergency Medicine

## 2013-01-23 VITALS — BP 138/65 | HR 88 | Temp 98.2°F | Resp 16 | Ht 71.0 in | Wt 157.0 lb

## 2013-01-23 DIAGNOSIS — E119 Type 2 diabetes mellitus without complications: Secondary | ICD-10-CM | POA: Insufficient documentation

## 2013-01-23 DIAGNOSIS — S161XXA Strain of muscle, fascia and tendon at neck level, initial encounter: Secondary | ICD-10-CM

## 2013-01-23 DIAGNOSIS — IMO0001 Reserved for inherently not codable concepts without codable children: Secondary | ICD-10-CM

## 2013-01-23 DIAGNOSIS — S139XXA Sprain of joints and ligaments of unspecified parts of neck, initial encounter: Secondary | ICD-10-CM

## 2013-01-23 MED ORDER — NAPROXEN SODIUM 550 MG PO TABS: 550.0000 mg | ORAL_TABLET | Freq: Two times a day (BID) | ORAL | Status: DC

## 2013-01-23 MED ORDER — HYDROCODONE-ACETAMINOPHEN 5-325 MG PO TABS: 1.0000 | ORAL_TABLET | ORAL | Status: DC | PRN

## 2013-01-23 MED ORDER — METAXALONE 800 MG PO TABS: 800.0000 mg | ORAL_TABLET | Freq: Three times a day (TID) | ORAL | Status: DC

## 2013-01-23 NOTE — Patient Instructions (Addendum)

## 2013-01-23 NOTE — Progress Notes (Signed)
[facility] [address], [place] [address] [phone]  Date:  01/23/2013   Name:  [name]   DOB:  26-Mar-1951   MRN:  [id number]  PCP:  [name], MD    Chief Complaint: Back Pain   History of Present Illness:  [name] is a 61 y.o. 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  Has pain that prevents moving his neck to the left at all. Not radiating and no neuro symptoms.  No other complaints.  No improvement with over the counter medications or other home remedies. Denies other complaint or health concern today.   Patient Active Problem List   Diagnosis Date Noted  . CAD (coronary artery disease) 04/15/2011  . Hyperlipidemia 04/15/2011    Past Medical History  Diagnosis Date  . Coronary artery disease   . Peripheral vascular disease   . Diabetes mellitus   . Dyslipidemia   . Cigarette smoker     Past Surgical History  Procedure Laterality Date  . Cardiac catheterization      EF is 55-60% and no wall motion abnormalities    History  Substance Use Topics  . Smoking status: Current Every Day Smoker -- 1.00 packs/day  . Smokeless tobacco: Not on file  . Alcohol Use:     Family History  Problem Relation Age of Onset  . Coronary artery disease Mother     Allergies  Allergen Reactions  . Simvastatin     Leg aches, muscle weakness     Medication list has been reviewed and updated.  Current Outpatient Prescriptions on File Prior to Visit  Medication Sig Dispense Refill  . aspirin 81 MG tablet Take 81 mg by mouth daily.        . folic acid (FOLVITE) 800 MCG tablet Take 400 mcg by mouth daily.        . Insulin Human (INSULIN PUMP) 100 unit/ml SOLN Inject into the skin daily.        . metoprolol succinate (TOPROL-XL) 25 MG 24 hr tablet Take 1 tablet (25 mg total) by mouth daily.  30 tablet  5  .  ramipril (ALTACE) 10 MG tablet Take 10 mg by mouth daily.         No current facility-administered medications on file prior to visit.    Review of Systems:  As per HPI, otherwise negative.    Physical Examination: Filed Vitals:   01/23/13 1941  BP: 138/65  Pulse: 88  Temp: 98.2 F (36.8 C)  Resp: 16   Filed Vitals:   01/23/13 1941  Height: 5\' 11"  (1.803 m)  Weight: 157 lb (71.215 kg)   Body mass index is 21.91 kg/(m^2). Ideal Body Weight: Weight in (lb) to have BMI = 25: 178.9   GEN: WDWN, NAD, Non-toxic, Alert & Oriented x 3 HEENT: Atraumatic, Normocephalic.  Ears and Nose: No external deformity. EXTR: No clubbing/cyanosis/edema NEURO: Normal gait.  PSYCH: Normally interactive. Conversant. Not depressed or anxious appearing.  Calm demeanor.  NECK:  Tender right trapezius. Neuro grossly intact.    Assessment and Plan: Cervical strain Anaprox Skelaxin vicodin Local heat Stop smoking   Signed,  Phillips Odor, MD

## 2013-02-14 ENCOUNTER — Other Ambulatory Visit: Payer: Self-pay | Admitting: *Deleted

## 2013-02-14 MED ORDER — METOPROLOL SUCCINATE ER 25 MG PO TB24: 25.0000 mg | ORAL_TABLET | Freq: Every day | ORAL | Status: AC

## 2013-02-14 NOTE — Telephone Encounter (Signed)
NEED APPOINTMENT Fax Received. Refill Completed. [name] (R.M.A)   

## 2013-05-31 ENCOUNTER — Encounter: Payer: Self-pay | Admitting: Cardiovascular Disease

## 2013-07-03 ENCOUNTER — Ambulatory Visit (INDEPENDENT_AMBULATORY_CARE_PROVIDER_SITE_OTHER): Payer: BC Managed Care – PPO | Admitting: Emergency Medicine

## 2013-07-03 VITALS — BP 120/70 | HR 81 | Temp 98.2°F | Resp 16 | Ht 71.0 in | Wt 163.0 lb

## 2013-07-03 DIAGNOSIS — S0100XA Unspecified open wound of scalp, initial encounter: Secondary | ICD-10-CM

## 2013-07-03 DIAGNOSIS — Z23 Encounter for immunization: Secondary | ICD-10-CM

## 2013-07-03 DIAGNOSIS — R51 Headache: Secondary | ICD-10-CM

## 2013-07-03 DIAGNOSIS — S01311A Laceration without foreign body of right ear, initial encounter: Secondary | ICD-10-CM

## 2013-07-03 DIAGNOSIS — S01309A Unspecified open wound of unspecified ear, initial encounter: Secondary | ICD-10-CM

## 2013-07-03 DIAGNOSIS — S0101XA Laceration without foreign body of scalp, initial encounter: Secondary | ICD-10-CM

## 2013-07-03 NOTE — Addendum Note (Signed)
Addended by: Morrell Riddle on: 07/03/2013 08:54 PM   Modules accepted: Kipp Brood

## 2013-07-03 NOTE — Progress Notes (Signed)
Procedure:  Consent obtained.  Local anesthesia with 2% lido.  Ear laceration cleaned with soapy water and closed with 5-0 Ethilon #8 SI sutures.  Laceration behind the ear cleaned and closed with 5-0 Ethilon #2 SI.  Wound care d/w pt. SR 7 days.

## 2013-07-03 NOTE — Patient Instructions (Signed)
Head Injury, Adult °You have had a head injury that does not appear serious at this time. A concussion is a state of changed mental ability, usually from a blow to the head. You should take clear liquids for the rest of the day and then resume your regular diet. You should not take sedatives or alcoholic beverages for as long as directed by your caregiver after discharge. After injuries such as yours, most problems occur within the first 24 hours. °SYMPTOMS °These minor symptoms may be experienced after discharge: °· Memory difficulties. °· Dizziness. °· Headaches. °· Double vision. °· Hearing difficulties. °· Depression. °· Tiredness. °· Weakness. °· Difficulty with concentration. °If you experience any of these problems, you should not be alarmed. A concussion requires a few days for recovery. Many patients with head injuries frequently experience such symptoms. Usually, these problems disappear without medical care. If symptoms last for more than one day, notify your caregiver. See your caregiver sooner if symptoms are becoming worse rather than better. °HOME CARE INSTRUCTIONS  °· During the next 24 hours you must stay with someone who can watch you for the warning signs listed below. °Although it is unlikely that serious side effects will occur, you should be aware of signs and symptoms which may necessitate your return to this location. Side effects may occur up to 7  10 days following the injury. It is important for you to carefully monitor your condition and contact your caregiver or seek immediate medical attention if there is a change in your condition. °SEEK IMMEDIATE MEDICAL CARE IF:  °· There is confusion or drowsiness. °· You can not awaken the injured person. °· There is nausea (feeling sick to your stomach) or continued, forceful vomiting. °· You notice dizziness or unsteadiness which is getting worse, or inability to walk. °· You have convulsions or unconsciousness. °· You experience severe,  persistent headaches not relieved by over-the-counter or prescription medicines for pain. (Do not take aspirin as this impairs clotting abilities). Take other pain medications only as directed. °· You can not use arms or legs normally. °· There is clear or bloody discharge from the nose or ears. °MAKE SURE YOU:  °· Understand these instructions. °· Will watch your condition. °· Will get help right away if you are not doing well or get worse. °Document Released: 08/10/2005 Document Revised: 11/02/2011 Document Reviewed: 06/28/2009 °ExitCare® Patient Information ©2014 [employer]. ° °Laceration Care, Adult °A laceration is a cut or lesion that goes through all layers of the skin and into the tissue just beneath the skin. °TREATMENT  °Some lacerations may not require closure. Some lacerations may not be able to be closed due to an increased risk of infection. It is important to see your caregiver as soon as possible after an injury to minimize the risk of infection and maximize the opportunity for successful closure. °If closure is appropriate, pain medicines may be given, if needed. The wound will be cleaned to help prevent infection. Your caregiver will use stitches (sutures), staples, wound glue (adhesive), or skin adhesive strips to repair the laceration. These tools bring the skin edges together to allow for faster healing and a better cosmetic outcome. However, all wounds will heal with a scar. Once the wound has healed, scarring can be minimized by covering the wound with sunscreen during the day for 1 full year. °HOME CARE INSTRUCTIONS  °For sutures or staples: °· Keep the wound clean and dry. °· If you were given a bandage (dressing), you should   change it at least once a day. Also, change the dressing if it becomes wet or dirty, or as directed by your caregiver. °· Wash the wound with soap and water 2 times a day. Rinse the wound off with water to remove all soap. Pat the wound dry with a clean  towel. °· After cleaning, apply a thin layer of the antibiotic ointment as recommended by your caregiver. This will help prevent infection and keep the dressing from sticking. °· You may shower as usual after the first 24 hours. Do not soak the wound in water until the sutures are removed. °· Only take over-the-counter or prescription medicines for pain, discomfort, or fever as directed by your caregiver. °· Get your sutures or staples removed as directed by your caregiver. °For skin adhesive strips: °· Keep the wound clean and dry. °· Do not get the skin adhesive strips wet. You may bathe carefully, using caution to keep the wound dry. °· If the wound gets wet, pat it dry with a clean towel. °· Skin adhesive strips will fall off on their own. You may trim the strips as the wound heals. Do not remove skin adhesive strips that are still stuck to the wound. They will fall off in time. °For wound adhesive: °· You may briefly wet your wound in the shower or bath. Do not soak or scrub the wound. Do not swim. Avoid periods of heavy perspiration until the skin adhesive has fallen off on its own. After showering or bathing, gently pat the wound dry with a clean towel. °· Do not apply liquid medicine, cream medicine, or ointment medicine to your wound while the skin adhesive is in place. This may loosen the film before your wound is healed. °· If a dressing is placed over the wound, be careful not to apply tape directly over the skin adhesive. This may cause the adhesive to be pulled off before the wound is healed. °· Avoid prolonged exposure to sunlight or tanning lamps while the skin adhesive is in place. Exposure to ultraviolet light in the first year will darken the scar. °· The skin adhesive will usually remain in place for 5 to 10 days, then naturally fall off the skin. Do not pick at the adhesive film. °You may need a tetanus shot if: °· You cannot remember when you had your last tetanus shot. °· You have never had a  tetanus shot. °If you get a tetanus shot, your arm may swell, get red, and feel warm to the touch. This is common and not a problem. If you need a tetanus shot and you choose not to have one, there is a rare chance of getting tetanus. Sickness from tetanus can be serious. °SEEK MEDICAL CARE IF:  °· You have redness, swelling, or increasing pain in the wound. °· You see a red line that goes away from the wound. °· You have yellowish-white fluid (pus) coming from the wound. °· You have a fever. °· You notice a bad smell coming from the wound or dressing. °· Your wound breaks open before or after sutures have been removed. °· You notice something coming out of the wound such as wood or glass. °· Your wound is on your hand or foot and you cannot move a finger or toe. °SEEK IMMEDIATE MEDICAL CARE IF:  °· Your pain is not controlled with prescribed medicine. °· You have severe swelling around the wound causing pain and numbness or a change in color in your arm,   hand, leg, or foot. °· Your wound splits open and starts bleeding. °· You have worsening numbness, weakness, or loss of function of any joint around or beyond the wound. °· You develop painful lumps near the wound or on the skin anywhere on your body. °MAKE SURE YOU:  °· Understand these instructions. °· Will watch your condition. °· Will get help right away if you are not doing well or get worse. °Document Released: 08/10/2005 Document Revised: 11/02/2011 Document Reviewed: 02/03/2011 °ExitCare® Patient Information ©2014 [employer]. ° °

## 2013-07-03 NOTE — Progress Notes (Signed)
[facility] [address] [place] [address] [phone]  Date:  07/03/2013   Name:  [name]   DOB:  November 27, 1950   MRN:  [id number]  PCP:  [name], MD    Chief Complaint: Head Injury   History of Present Illness:  [name] is a 62 y.o. very pleasant male patient who presents with the following:  Was placing a deer stand up a tree and the stand fell and struck him in the right side of his head.  He has multiple lacerations.  No LOC.  No neuro or visual symptoms. No difficulty with hearing. No neck pain.  Not current on Tetanus.  No improvement with over the counter medications or other home remedies. Denies other complaint or health concern today.   Patient Active Problem List   Diagnosis Date Noted  . IDDM (insulin dependent diabetes mellitus) 01/23/2013  . CAD (coronary artery disease) 04/15/2011  . Hyperlipidemia 04/15/2011    Past Medical History  Diagnosis Date  . Coronary artery disease   . Peripheral vascular disease   . Diabetes mellitus   . Dyslipidemia   . Cigarette smoker     Past Surgical History  Procedure Laterality Date  . Cardiac catheterization      EF is 55-60% and no wall motion abnormalities    History  Substance Use Topics  . Smoking status: Current Every Day Smoker -- 1.00 packs/day  . Smokeless tobacco: Not on file  . Alcohol Use:     Family History  Problem Relation Age of Onset  . Coronary artery disease Mother     Allergies  Allergen Reactions  . Simvastatin     Leg aches, muscle weakness     Medication list has been reviewed and updated.  Current Outpatient Prescriptions on File Prior to Visit  Medication Sig Dispense Refill  . aspirin 81 MG tablet Take 81 mg by mouth daily.        . folic acid (FOLVITE) 800 MCG tablet Take 400 mcg by mouth daily.        . Insulin Human (INSULIN PUMP) 100 unit/ml SOLN Inject into the skin daily.        . metaxalone (SKELAXIN) 800 MG tablet Take 1  tablet (800 mg total) by mouth 3 (three) times daily.  45 tablet  1  . metoprolol succinate (TOPROL-XL) 25 MG 24 hr tablet Take 1 tablet (25 mg total) by mouth daily.  30 tablet  1  . ramipril (ALTACE) 10 MG tablet Take 10 mg by mouth daily.        . naproxen sodium (ANAPROX DS) 550 MG tablet Take 1 tablet (550 mg total) by mouth 2 (two) times daily with a meal.  40 tablet  0   No current facility-administered medications on file prior to visit.    Review of Systems:  As per HPI, otherwise negative.    Physical Examination: Filed Vitals:   07/03/13 1942  BP: 120/70  Pulse: 81  Temp: 98.2 F (36.8 C)  Resp: 16   Filed Vitals:   07/03/13 1942  Height: 5\' 11"  (1.803 m)  Weight: 163 lb (73.936 kg)   Body mass index is 22.74 kg/(m^2). Ideal Body Weight: Weight in (lb) to have BMI = 25: 178.9   GEN: WDWN, NAD, Non-toxic, Alert & Oriented x 3 HEENT: multiple laceraions, Normocephalic.  Ears and Nose: No external deformity. EXTR: No clubbing/cyanosis/edema NEURO: Normal gait.  PSYCH: Normally interactive. Conversant.  Not depressed or anxious appearing.  Calm demeanor.  SKIN:  Laceration 3 cm auricle ; laceration 2 cm mastoid; laceration 2 cm scalp. NECK:  Not tender  Assessment and Plan: Laceration ear, scalp and premastoid area TDAP   Signed,  Phillips Odor, MD

## 2013-07-14 ENCOUNTER — Ambulatory Visit (INDEPENDENT_AMBULATORY_CARE_PROVIDER_SITE_OTHER): Payer: BC Managed Care – PPO | Admitting: Physician Assistant

## 2013-07-14 VITALS — BP 112/52 | HR 72 | Temp 98.4°F | Resp 16 | Ht 70.0 in | Wt 160.0 lb

## 2013-07-14 DIAGNOSIS — S0190XD Unspecified open wound of unspecified part of head, subsequent encounter: Secondary | ICD-10-CM

## 2013-07-14 DIAGNOSIS — Z5189 Encounter for other specified aftercare: Secondary | ICD-10-CM

## 2013-07-14 NOTE — Progress Notes (Signed)
  Subjective:    Patient ID: [name], male    DOB: [birth year], 62 y.o.   MRN: [id number]  Suture / Staple Removal   62 year old male presents for suture removal.  DOI 07/03/13.  Doing well without any issues or complaints. Denies erythema, warmth, or drainage.  Wounds seem to be well healing.  Patient is otherwise doing well with no other concerns today.     Review of Systems  Constitutional: Negative for fever and chills.  Gastrointestinal: Negative for nausea and vomiting.  Skin: Positive for wound.  Neurological: Negative for dizziness and headaches.       Objective:   Physical Exam  Constitutional: He is oriented to person, place, and time. He appears well-developed and well-nourished.  HENT:  Head: Normocephalic and atraumatic.  Right Ear: External ear normal.  Left Ear: External ear normal.  Eyes: Conjunctivae are normal.  Neck: Normal range of motion.  Cardiovascular: Normal rate.   Pulmonary/Chest: Effort normal.  Neurological: He is alert and oriented to person, place, and time.  Skin:  Wound of right ear lobe well healing without erythema warmth or drainage.  There is another smaller wound behind his right ear that is well healing as well.   Psychiatric: He has a normal mood and affect. His behavior is normal. Judgment and thought content normal.     #8 sutures removed from ear and #2 sutures removed from wound behind ear. Scab in place. Patient tolerated well.      Assessment & Plan:  Wound, open, head, subsequent encounter  Wounds well healing Follow up as needed.

## 2013-08-10 DIAGNOSIS — N529 Male erectile dysfunction, unspecified: Secondary | ICD-10-CM | POA: Insufficient documentation

## 2013-08-14 ENCOUNTER — Ambulatory Visit (HOSPITAL_COMMUNITY)
Admission: RE | Admit: 2013-08-14 | Discharge: 2013-08-14 | Disposition: A | Discharge status: Home / Self Care | Payer: BC Managed Care – PPO | Source: Ambulatory Visit | Attending: Vascular Surgery | Admitting: Vascular Surgery

## 2013-08-14 ENCOUNTER — Other Ambulatory Visit (HOSPITAL_COMMUNITY): Payer: Self-pay | Admitting: Internal Medicine

## 2013-08-14 DIAGNOSIS — I70209 Unspecified atherosclerosis of native arteries of extremities, unspecified extremity: Secondary | ICD-10-CM | POA: Insufficient documentation

## 2013-08-14 DIAGNOSIS — I739 Peripheral vascular disease, unspecified: Secondary | ICD-10-CM

## 2013-10-14 ENCOUNTER — Ambulatory Visit: Payer: BC Managed Care – PPO

## 2013-11-23 ENCOUNTER — Encounter: Payer: Self-pay | Admitting: Surgery

## 2013-11-27 ENCOUNTER — Encounter: Payer: BC Managed Care – PPO | Admitting: Surgery

## 2013-12-29 ENCOUNTER — Encounter: Payer: Self-pay | Admitting: Surgery

## 2014-01-01 ENCOUNTER — Encounter: Payer: BC Managed Care – PPO | Admitting: Surgery

## 2014-01-17 ENCOUNTER — Encounter (HOSPITAL_COMMUNITY): Payer: Self-pay | Admitting: Pharmacy Technician

## 2014-01-22 MED ORDER — SODIUM CHLORIDE 0.9 % IV SOLN
INTRAVENOUS | Status: DC
  Administered 2014-01-23: 08:00:00 via INTRAVENOUS

## 2014-01-23 ENCOUNTER — Ambulatory Visit (HOSPITAL_COMMUNITY)
Admission: RE | Admit: 2014-01-23 | Discharge: 2014-01-23 | Disposition: A | Discharge status: Home / Self Care | Payer: BC Managed Care – PPO | Source: Ambulatory Visit | Attending: Cardiology | Admitting: Cardiology

## 2014-01-23 ENCOUNTER — Encounter (HOSPITAL_COMMUNITY)
Admission: RE | Disposition: A | Discharge status: Home / Self Care | Payer: Self-pay | Source: Ambulatory Visit | Attending: Cardiology

## 2014-01-23 DIAGNOSIS — I739 Peripheral vascular disease, unspecified: Secondary | ICD-10-CM | POA: Diagnosis present

## 2014-01-23 DIAGNOSIS — Z538 Procedure and treatment not carried out for other reasons: Secondary | ICD-10-CM | POA: Insufficient documentation

## 2014-01-23 DIAGNOSIS — I700 Atherosclerosis of aorta: Secondary | ICD-10-CM | POA: Diagnosis not present

## 2014-01-23 DIAGNOSIS — I70219 Atherosclerosis of native arteries of extremities with intermittent claudication, unspecified extremity: Secondary | ICD-10-CM | POA: Diagnosis not present

## 2014-01-23 DIAGNOSIS — I7092 Chronic total occlusion of artery of the extremities: Secondary | ICD-10-CM | POA: Insufficient documentation

## 2014-01-23 HISTORY — PX: LOWER EXTREMITY ANGIOGRAM: SHX5508

## 2014-01-23 LAB — BASIC METABOLIC PANEL
BUN: 18 mg/dL (ref 6–23)
CHLORIDE: 103 meq/L (ref 96–112)
CO2: 24 meq/L (ref 19–32)
Calcium: 9 mg/dL (ref 8.4–10.5)
Creatinine, Ser: 0.78 mg/dL (ref 0.50–1.35)
GFR calc Af Amer: >90 mL/min (ref >90)
GFR calc non Af Amer: >90 mL/min (ref >90)
Glucose, Bld: 85 mg/dL (ref 70–99)
POTASSIUM: 4.1 meq/L (ref 3.7–5.3)
Sodium: 138 mEq/L (ref 137–147)

## 2014-01-23 LAB — GLUCOSE, CAPILLARY
GLUCOSE-CAPILLARY: 86 mg/dL (ref 70–99)
GLUCOSE-CAPILLARY: 249 mg/dL — AB (ref 70–99)
Glucose-Capillary: 177 mg/dL — ABNORMAL HIGH (ref 70–99)
Glucose-Capillary: 261 mg/dL — ABNORMAL HIGH (ref 70–99)

## 2014-01-23 LAB — CBC
HEMATOCRIT: 38.9 % — AB (ref 39.0–52.0)
Hemoglobin: 13.7 g/dL (ref 13.0–17.0)
MCH: 32.9 pg (ref 26.0–34.0)
MCHC: 35.2 g/dL (ref 30.0–36.0)
MCV: 93.3 fL (ref 78.0–100.0)
PLATELETS: 313 10*3/uL (ref 150–400)
RBC: 4.17 MIL/uL — AB (ref 4.22–5.81)
RDW: 13.7 % (ref 11.5–15.5)
WBC: 4.8 10*3/uL (ref 4.0–10.5)

## 2014-01-23 LAB — POCT ACTIVATED CLOTTING TIME
ACTIVATED CLOTTING TIME: 227 s
ACTIVATED CLOTTING TIME: 227 s
Activated Clotting Time: 166 seconds

## 2014-01-23 LAB — PROTIME-INR
INR: 0.96 (ref 0.00–1.49)
PROTHROMBIN TIME: 12.6 s (ref 11.6–15.2)

## 2014-01-23 SURGERY — ANGIOGRAM, LOWER EXTREMITY
Anesthesia: LOCAL

## 2014-01-23 MED ORDER — MORPHINE SULFATE 2 MG/ML IJ SOLN: 4.0000 mg | INTRAMUSCULAR | Status: DC | PRN

## 2014-01-23 MED ORDER — SODIUM CHLORIDE 0.9 % IV SOLN: 1.0000 mL/kg/h | INTRAVENOUS | Status: DC

## 2014-01-23 MED ORDER — HYDROMORPHONE HCL PF 1 MG/ML IJ SOLN
INTRAMUSCULAR | Status: AC
  Filled 2014-01-23: qty 1

## 2014-01-23 MED ORDER — LIDOCAINE HCL (PF) 1 % IJ SOLN
INTRAMUSCULAR | Status: AC
  Filled 2014-01-23: qty 30

## 2014-01-23 MED ORDER — MIDAZOLAM HCL 2 MG/2ML IJ SOLN
INTRAMUSCULAR | Status: AC
  Filled 2014-01-23: qty 2

## 2014-01-23 MED ORDER — HEPARIN (PORCINE) IN NACL 2-0.9 UNIT/ML-% IJ SOLN
INTRAMUSCULAR | Status: AC
  Filled 2014-01-23: qty 1000

## 2014-01-23 MED ORDER — HEPARIN SODIUM (PORCINE) 1000 UNIT/ML IJ SOLN
INTRAMUSCULAR | Status: AC
  Filled 2014-01-23: qty 1

## 2014-01-23 NOTE — CV Procedure (Addendum)
Procedures performed:  1. Right Femoral Arterial access 2. Abdominal aortogram. Abdominal aortogram with bifemoral runoff 3. Crossover from right to the left  femoral artery placement of catheter tip in the left superficial  femoral artery.  4. left femoral arteriogram with distal runoff 5. failed PTA of the left distal SFA and popliteal artery. Indication: Claudication,  Abnormal LE arterial duplex.   Peripheral arthrogram: No evidence of abdominal aneurysm. 2 renal arteries on the right, one on left side and they're widely patent.   Aortoiliac bifurcation was widely patent. Mild amount of atherosclerotic changes and calcification evident in the abdominal aorta.  Femoral arteriogram:  Right femoral arteriogram with distal runoff revealed right SFA to be occluded in the mid and distal segment, reconstitutes just outside of the Hunter's canal. Severe calcification is evident. Below the right knee there is two-vessel runoff in the form of anterior and posterior tibial arteries. Peroneal artery is occluded. Moderate to diffuse severe disease evident in the right PT.  Left femoral arteriogram distal runoff revealed left distal SFA and left popliteal artery to be occluded, severely calcified lesion, the popliteal artery reconstitutes just above the knee joint, there is two vessel runoff below the left knee in the form of peroneal and posterior tibial artery. Anterior tibial artery is occluded.  Interventional data: Unsuccessful attempt at crossing the left distal SFA and left popliteal artery occlusion due to severe calcification. Multiple wires, including Viance catheter for crossing CTO with the help of Approach 18 weighted wire. I then changed the approach wire to Navicross catheter and 0.035 inch Glidewire and I attempted to cross the stenosis. Superstiff glide was also utilized. I was able to go through false lumen but I was not able to reentry into the true lumen due to heavy  calcification.  Recommendation: We can reattempt revascularization strategy with Crosser technology for the left distal SFA left popliteal artery stenosis. Same technique could potentially be utilized on the right side if conservative and usual techniques fail. Patient being on the angiography suite table for greater than an hour, I decided not to proceed with the intervention.  TECHNIQUE OF THE  PROCEDURE: Under sterile precautions using a 5-French right femoral arterial access, a 5 French pigtail catheter was advanced  into the desscending aorta and arteriogram was performed.   Bilateral abdominal aortogram with Femoral arterial runoff was then performed.  Left femoral arterial runoff was performed using the same catheter after crossing over from the right  femoral artery into the left femoral artery with the help of a pigtail and Versacore wire.  Technique of intervention: Using heparin for anticoagulation and a 7 French  45 cm Ancel sheath, sheath was placed into the left femoral artery. Then a Viance catheter was advanced over a Approach 18 g weighted wire and I attempted to cross the stenosis. In spite of significant attempt, I was unable to cross the stenosis due to heavy calcification. Then I pulled the Viance catheter out leaving the approach wire at the site of stenosis and exchange this to a Navicross 0.035 inch compatible 190 cm catheter. I then utilized angled Glidewire to cross the stenosis, due to inability to do this, I then utilized superstiff Glidewire also. I was able to pass through the false lumen, but I was not able to get back into the true lumen at the level. At that point I abandoned the procedure and the cath was brought pulled out of the body. The Ancel sheath was then pulled into the right common  femoral artery and over a Versacore wire and exchanged to a short 7 [place] sheath. Sheath was sutured in place. Patient tolerated the procedure. There was no immediate complication.

## 2014-01-23 NOTE — H&P (Signed)
  See the written copy of this report in the patient's paper medical record.  These results did not interface directly into the electronic medical record. 

## 2014-01-23 NOTE — Discharge Instructions (Signed)
Angiography, Care After Refer to this sheet in the next few weeks. These instructions provide you with information on caring for yourself after your procedure. Your health care provider may also give you more specific instructions. Your treatment has been planned according to current medical practices, but problems sometimes occur. Call your health care provider if you have any problems or questions after your procedure.  WHAT TO EXPECT AFTER THE PROCEDURE After your procedure, it is typical to have the following sensations:  Minor discomfort or tenderness and a small bump at the catheter insertion site. The bump should usually decrease in size and tenderness within 1 to 2 weeks.  Any bruising will usually fade within 2 to 4 weeks. HOME CARE INSTRUCTIONS   You may need to keep taking blood thinners if they were prescribed for you. Only take over-the-counter or prescription medicines for pain, fever, or discomfort as directed by your health care provider.  Do not apply powder or lotion to the site.  Do not sit in a bathtub, swimming pool, or whirlpool for 5 to 7 days.  You may shower 24 hours after the procedure. Remove the bandage (dressing) and gently wash the site with plain soap and water. Gently pat the site dry.  Inspect the site at least twice daily.  Limit your activity for the first 48 hours. Do not bend, squat, or lift anything over 20 lb (9 kg) or as directed by your health care provider.  Do not drive home if you are discharged the day of the procedure. Have someone else drive you. Follow instructions about when you can drive or return to work. SEEK MEDICAL CARE IF:  You get lightheaded when standing up.  You have drainage (other than a small amount of blood on the dressing).  You have chills.  You have a fever.  You have redness, warmth, swelling, or pain at the insertion site. SEEK IMMEDIATE MEDICAL CARE IF:   You develop chest pain or shortness of breath, feel faint,  or pass out.  You have bleeding, swelling larger than a walnut, or drainage from the catheter insertion site.  You develop pain, discoloration, coldness, or severe bruising in the leg or arm that held the catheter.  You develop bleeding from any other place, such as the bowels. You may see bright red blood in your urine or stools, or your stools may appear black and tarry.  You have heavy bleeding from the site. If this happens, hold pressure on the site. MAKE SURE YOU:  Understand these instructions.  Will watch your condition.  Will get help right away if you are not doing well or get worse. Document Released: 02/26/2005 Document Revised: 04/12/2013 Document Reviewed: 01/02/2013 [facility] Patient Information 2014 [place], [place].  Peripheral Vascular Disease  Peripheral vascular disease (PVD) is caused by cholesterol buildup in the arteries. The arteries become narrow or clogged. This makes it hard for blood to flow. It happens most in the legs, but it can occur in other areas of your body. HOME CARE   Quit smoking, if you smoke.  Exercise as told by your doctor.  Follow a low-fat, low-cholesterol diet as told by your doctor.  Control your diabetes, if you have diabetes.  Care for your feet to prevent infection.  Only take medicine as told by your doctor. GET HELP RIGHT AWAY IF:   You have pain or lose feeling (numbness) in your arms or legs.  Your arms or legs turn cold or blue.  You have  redness, warmth, and puffiness (swelling) in your arms or legs. MAKE SURE YOU:   Understand these instructions.  Will watch your condition.  Will get help right away if you are not doing well or get worse. Document Released: 11/04/2009 Document Revised: 11/02/2011 Document Reviewed: 11/04/2009 [facility] Patient Information 2014 [place], [place].

## 2014-01-23 NOTE — Interval H&P Note (Signed)
History and Physical Interval Note:  01/23/2014 8:56 AM  [name]  has presented today for surgery, with the diagnosis of Claudication  The various methods of treatment have been discussed with the patient and family. After consideration of risks, benefits and other options for treatment, the patient has consented to  Procedure(s): LOWER EXTREMITY ANGIOGRAM (N/A) and possible PCI as a surgical intervention .  The patient's history has been reviewed, patient examined, no change in status, stable for surgery.  I have reviewed the patient's chart and labs.  Questions were answered to the patient's satisfaction.   Patient consents to having students observing: Trenda Moots, and [address] Bhagat   Pamella Pert

## 2014-02-06 ENCOUNTER — Encounter (HOSPITAL_COMMUNITY): Payer: Self-pay

## 2014-02-06 ENCOUNTER — Ambulatory Visit (HOSPITAL_COMMUNITY): Admit: 2014-02-06 | Payer: Self-pay | Admitting: Cardiology

## 2014-02-06 SURGERY — ANGIOGRAM, LOWER EXTREMITY
Anesthesia: LOCAL

## 2014-02-15 ENCOUNTER — Encounter: Payer: Self-pay | Admitting: Internal Medicine

## 2014-04-18 ENCOUNTER — Encounter: Payer: BC Managed Care – PPO | Admitting: Internal Medicine

## 2014-05-28 ENCOUNTER — Encounter (HOSPITAL_COMMUNITY): Payer: Self-pay | Admitting: Pharmacy Technician

## 2014-05-29 ENCOUNTER — Encounter (HOSPITAL_COMMUNITY)
Admission: RE | Disposition: A | Discharge status: Home / Self Care | Payer: Self-pay | Source: Ambulatory Visit | Attending: Cardiology

## 2014-05-29 ENCOUNTER — Ambulatory Visit (HOSPITAL_COMMUNITY)
Admission: RE | Admit: 2014-05-29 | Discharge: 2014-05-29 | Disposition: A | Discharge status: Home / Self Care | Payer: BC Managed Care – PPO | Source: Ambulatory Visit | Attending: Cardiology | Admitting: Cardiology

## 2014-05-29 DIAGNOSIS — Z7982 Long term (current) use of aspirin: Secondary | ICD-10-CM | POA: Insufficient documentation

## 2014-05-29 DIAGNOSIS — E785 Hyperlipidemia, unspecified: Secondary | ICD-10-CM | POA: Insufficient documentation

## 2014-05-29 DIAGNOSIS — Z720 Tobacco use: Secondary | ICD-10-CM | POA: Insufficient documentation

## 2014-05-29 DIAGNOSIS — I739 Peripheral vascular disease, unspecified: Secondary | ICD-10-CM | POA: Diagnosis present

## 2014-05-29 DIAGNOSIS — I2510 Atherosclerotic heart disease of native coronary artery without angina pectoris: Secondary | ICD-10-CM | POA: Insufficient documentation

## 2014-05-29 DIAGNOSIS — I7092 Chronic total occlusion of artery of the extremities: Secondary | ICD-10-CM | POA: Diagnosis not present

## 2014-05-29 DIAGNOSIS — E118 Type 2 diabetes mellitus with unspecified complications: Secondary | ICD-10-CM | POA: Insufficient documentation

## 2014-05-29 DIAGNOSIS — Z794 Long term (current) use of insulin: Secondary | ICD-10-CM | POA: Insufficient documentation

## 2014-05-29 DIAGNOSIS — I70212 Atherosclerosis of native arteries of extremities with intermittent claudication, left leg: Secondary | ICD-10-CM | POA: Diagnosis not present

## 2014-05-29 HISTORY — PX: ABDOMINAL ANGIOGRAM: SHX5499

## 2014-05-29 LAB — CBC
HEMATOCRIT: 40.4 % (ref 39.0–52.0)
HEMOGLOBIN: 14.3 g/dL (ref 13.0–17.0)
MCH: 32.6 pg (ref 26.0–34.0)
MCHC: 35.4 g/dL (ref 30.0–36.0)
MCV: 92 fL (ref 78.0–100.0)
Platelets: 291 10*3/uL (ref 150–400)
RBC: 4.39 MIL/uL (ref 4.22–5.81)
RDW: 13.9 % (ref 11.5–15.5)
WBC: 5.2 10*3/uL (ref 4.0–10.5)

## 2014-05-29 LAB — BASIC METABOLIC PANEL
Anion gap: 10 (ref 5–15)
BUN: 17 mg/dL (ref 6–23)
CO2: 26 meq/L (ref 19–32)
Calcium: 9.1 mg/dL (ref 8.4–10.5)
Chloride: 104 mEq/L (ref 96–112)
Creatinine, Ser: 0.84 mg/dL (ref 0.50–1.35)
GFR calc Af Amer: >90 mL/min (ref >90)
GLUCOSE: 73 mg/dL (ref 70–99)
Potassium: 4 mEq/L (ref 3.7–5.3)
Sodium: 140 mEq/L (ref 137–147)

## 2014-05-29 LAB — POCT ACTIVATED CLOTTING TIME
ACTIVATED CLOTTING TIME: 236 s
ACTIVATED CLOTTING TIME: 197 s
ACTIVATED CLOTTING TIME: 157 s
Activated Clotting Time: 208 seconds
Activated Clotting Time: 219 seconds
Activated Clotting Time: 242 seconds

## 2014-05-29 LAB — PROTIME-INR
INR: 1.01 (ref 0.00–1.49)
PROTHROMBIN TIME: 13.3 s (ref 11.6–15.2)

## 2014-05-29 LAB — GLUCOSE, CAPILLARY
GLUCOSE-CAPILLARY: 57 mg/dL — AB (ref 70–99)
GLUCOSE-CAPILLARY: 136 mg/dL — AB (ref 70–99)
GLUCOSE-CAPILLARY: 244 mg/dL — AB (ref 70–99)
Glucose-Capillary: 74 mg/dL (ref 70–99)
Glucose-Capillary: 130 mg/dL — ABNORMAL HIGH (ref 70–99)
Glucose-Capillary: 251 mg/dL — ABNORMAL HIGH (ref 70–99)
Glucose-Capillary: 336 mg/dL — ABNORMAL HIGH (ref 70–99)
Glucose-Capillary: 195 mg/dL — ABNORMAL HIGH (ref 70–99)

## 2014-05-29 LAB — APTT: aPTT: 30 seconds (ref 24–37)

## 2014-05-29 SURGERY — ABDOMINAL ANGIOGRAM
Anesthesia: LOCAL

## 2014-05-29 MED ORDER — OXYCODONE-ACETAMINOPHEN 5-325 MG PO TABS: 1.0000 | ORAL_TABLET | ORAL | Status: DC | PRN

## 2014-05-29 MED ORDER — SODIUM CHLORIDE 0.9 % IV SOLN: 1.0000 mL/kg/h | INTRAVENOUS | Status: DC

## 2014-05-29 MED ORDER — HYDROMORPHONE HCL 1 MG/ML IJ SOLN
INTRAMUSCULAR | Status: AC
  Filled 2014-05-29: qty 1

## 2014-05-29 MED ORDER — DEXTROSE 50 % IV SOLN
INTRAVENOUS | Status: AC
  Filled 2014-05-29: qty 50

## 2014-05-29 MED ORDER — MIDAZOLAM HCL 2 MG/2ML IJ SOLN
INTRAMUSCULAR | Status: AC
  Filled 2014-05-29: qty 2

## 2014-05-29 MED ORDER — DEXTROSE 50 % IV SOLN
25.0000 mL | Freq: Once | INTRAVENOUS | Status: AC
  Administered 2014-05-29: 25 mL via INTRAVENOUS

## 2014-05-29 MED ORDER — SODIUM CHLORIDE 0.9 % IV SOLN: INTRAVENOUS | Status: DC

## 2014-05-29 MED ORDER — LIDOCAINE HCL (PF) 1 % IJ SOLN
INTRAMUSCULAR | Status: AC
  Filled 2014-05-29: qty 30

## 2014-05-29 MED ORDER — HEPARIN (PORCINE) IN NACL 2-0.9 UNIT/ML-% IJ SOLN
INTRAMUSCULAR | Status: AC
Start: 2014-05-29 — End: 2014-05-29
  Filled 2014-05-29: qty 1000

## 2014-05-29 MED ORDER — HEPARIN SODIUM (PORCINE) 1000 UNIT/ML IJ SOLN
INTRAMUSCULAR | Status: AC
  Filled 2014-05-29: qty 1

## 2014-05-29 NOTE — Interval H&P Note (Signed)
History and Physical Interval Note:  05/29/2014 7:51 AM  [name]  has presented today for surgery, with the diagnosis of Claudication  The various methods of treatment have been discussed with the patient and family. After consideration of risks, benefits and other options for treatment, the patient has consented to  Procedure(s): ABDOMINAL ANGIOGRAM (N/A) and possible PTA as a surgical intervention .  The patient's history has been reviewed, patient examined, no change in status, stable for surgery.  I have reviewed the patient's chart and labs.  Questions were answered to the patient's satisfaction.     Pamella PertGANJI,JAGADEESH R

## 2014-05-29 NOTE — H&P (Signed)
  Please see office visit notes for complete details of HPI.  

## 2014-05-29 NOTE — Progress Notes (Signed)
Client adjusting insulin pump

## 2014-05-29 NOTE — Progress Notes (Signed)
Site area: Right femoral artery Site Prior to Removal:  Level 0 Pressure Applied For: 17 min  Manual: Yes   Patient Status During Pull: Stable, patient comfortable.    Post Pull Site:  Level Post Pull Instructions Given: Yes Post Pull Pulses Present: DP present with doppler Dressing Applied:  Yes, tegaderm Bedrest begins @ 1:00 pm Comments:

## 2014-05-29 NOTE — CV Procedure (Signed)
Procedure performed: Right femoral arterial access, cross over from the right femoral artery into the left femoral artery and placement of the catheter tip into the left superficial femoral artery. Left femoral arteriogram with distal runoff. PTA and atherectomy of the left distal SFA proximal popliteal artery with a Crosser 14 S atherectomy device.  Equipment utilized: 7 French 45 cm angled Ancel sheath Versacore wire ViperTrack radiopaque marker tape Bard crosser CTO 14S atherectomy catheter CXI Support angled 2.6 French catheter Approach CTO 25 G 300 cm wire  Angiographic data: Left common femoral artery and left SFA diffusely calcified. The distal left SFA is occluded, chronic total occlusion and reconstitutes at the proximal motion of the popliteal artery.  Interventional data: Atherectomy of the left distal SFA and proximal popliteal artery as dictated above. However to recanalization of the CTO could not be performed due to inability to enter and break distal Cap.  Recommendation: I will discuss with the patient regarding medical therapy versus surgical options. Patient does have right SFA stenosis that appears to be amenable for percutaneous angioplasty. If patient wishes to proceed we'll schedule him for right SFA PTA.  Technique: Under sterile precautions using a 7 French right femoral arterial access the Ancel sheath was advanced with the help of a 5 [place]French crossover catheter and a Versacore wire into the left proximal superficial femoral artery. Arteriogram was performed with distal runoff. Having performed this I utilized the atherectomy device and multiple passes were made under careful radiographic and angiographic visualization. After 2 hours of attempted angioplasty, do to distal Not being able to be opened up, I then utilized a CXI Support angled 2.6 French catheter and utilized approach CTO wire to reenter the true lumen distally. With the Crosser catheter, I was in the false lumen  distally posteriorly. I was able to then take the heavy wire right at the inflow of the distal calf and then readvanced across the catheter after pulling the CXI out I readvanced Crosser atherectomy catheter and second attempt at atherectomy was performed. Having performed multiple attempts, the procedure was then abandoned. A total of 1.5 cc of contrast was utilized for diagnostic and interventional procedure. Patient tolerated procedure well. During the procedure patient received intravenous heparin and ACT was maintained at greater than 200. Manual pressure will be held for achieving hemostasis and patient will be discharged home today if remains stable. Patient tolerated the procedure without any complications. No significant blood loss.

## 2014-05-29 NOTE — Progress Notes (Signed)
HYPOGLYCEMIC EVENT  CBG: 57  TREATMENT: D50 IV 25 mL  SYMPTOMS: None  FOLLOW-UP CBG::   Time:  0722 CBG Result:130  POSSIBLE REASONS FOR EVENT: Inadequate meal intake

## 2014-05-29 NOTE — Progress Notes (Signed)
Report to [employer]

## 2014-05-29 NOTE — Discharge Instructions (Signed)
Angiogram, Care After °Refer to this sheet in the next few weeks. These instructions provide you with information on caring for yourself after your procedure. Your health care provider may also give you more specific instructions. Your treatment has been planned according to current medical practices, but problems sometimes occur. Call your health care provider if you have any problems or questions after your procedure.  °WHAT TO EXPECT AFTER THE PROCEDURE °After your procedure, it is typical to have the following sensations: °· Minor discomfort or tenderness and a small bump at the catheter insertion site. The bump should usually decrease in size and tenderness within 1 to 2 weeks. °· Any bruising will usually fade within 2 to 4 weeks. °HOME CARE INSTRUCTIONS  °· You may need to keep taking blood thinners if they were prescribed for you. Take medicines only as directed by your health care provider. °· Do not apply powder or lotion to the site. °· Do not take baths, swim, or use a hot tub until your health care provider approves. °· You may shower 24 hours after the procedure. Remove the bandage (dressing) and gently wash the site with plain soap and water. Gently pat the site dry. °· Inspect the site at least twice daily. °· Limit your activity for the first 48 hours. Do not bend, squat, or lift anything over 20 lb (9 kg) or as directed by your health care provider. °· Plan to have someone take you home after the procedure. Follow instructions about when you can drive or return to work. °SEEK MEDICAL CARE IF: °· You get light-headed when standing up. °· You have drainage (other than a small amount of blood on the dressing). °· You have chills. °· You have a fever. °· You have redness, warmth, swelling, or pain at the insertion site. °SEEK IMMEDIATE MEDICAL CARE IF:  °· You develop chest pain or shortness of breath, feel faint, or pass out. °· You have bleeding, swelling larger than a walnut, or drainage from the  catheter insertion site. °· You develop pain, discoloration, coldness, or severe bruising in the leg or arm that held the catheter. °· You have heavy bleeding from the site. If this happens, hold pressure on the site and call 911. °MAKE SURE YOU: °· Understand these instructions. °· Will watch your condition. °· Will get help right away if you are not doing well or get worse. °Document Released: 02/26/2005 Document Revised: 12/25/2013 Document Reviewed: 01/02/2013 °ExitCare® Patient Information ©2015 Exit[employer], LLC. This information is not intended to replace advice given to you by your health care provider. Make sure you discuss any questions you have with your health care provider. ° °

## 2014-05-29 NOTE — Progress Notes (Signed)
Spoke with Dr Jacinto HalimGanji and bedrest changed from 7hrs to 6hrs

## 2014-07-30 DIAGNOSIS — E109 Type 1 diabetes mellitus without complications: Secondary | ICD-10-CM

## 2014-07-30 DIAGNOSIS — I739 Peripheral vascular disease, unspecified: Secondary | ICD-10-CM

## 2014-07-30 DIAGNOSIS — E1151 Type 2 diabetes mellitus with diabetic peripheral angiopathy without gangrene: Secondary | ICD-10-CM

## 2014-07-31 ENCOUNTER — Ambulatory Visit (HOSPITAL_COMMUNITY)
Admission: RE | Admit: 2014-07-31 | Discharge: 2014-07-31 | Disposition: A | Discharge status: Home / Self Care | Payer: BC Managed Care – PPO | Source: Ambulatory Visit | Attending: Cardiology | Admitting: Cardiology

## 2014-07-31 ENCOUNTER — Encounter (HOSPITAL_COMMUNITY)
Admission: RE | Disposition: A | Discharge status: Home / Self Care | Payer: Self-pay | Source: Ambulatory Visit | Attending: Cardiology

## 2014-07-31 DIAGNOSIS — F17210 Nicotine dependence, cigarettes, uncomplicated: Secondary | ICD-10-CM | POA: Insufficient documentation

## 2014-07-31 DIAGNOSIS — Z716 Tobacco abuse counseling: Secondary | ICD-10-CM | POA: Insufficient documentation

## 2014-07-31 DIAGNOSIS — I739 Peripheral vascular disease, unspecified: Secondary | ICD-10-CM | POA: Diagnosis not present

## 2014-07-31 DIAGNOSIS — E785 Hyperlipidemia, unspecified: Secondary | ICD-10-CM | POA: Diagnosis not present

## 2014-07-31 DIAGNOSIS — E119 Type 2 diabetes mellitus without complications: Secondary | ICD-10-CM | POA: Diagnosis not present

## 2014-07-31 DIAGNOSIS — E109 Type 1 diabetes mellitus without complications: Secondary | ICD-10-CM

## 2014-07-31 DIAGNOSIS — E1151 Type 2 diabetes mellitus with diabetic peripheral angiopathy without gangrene: Secondary | ICD-10-CM

## 2014-07-31 HISTORY — PX: LOWER EXTREMITY ANGIOGRAM: SHX5508

## 2014-07-31 LAB — BASIC METABOLIC PANEL
ANION GAP: 10 (ref 5–15)
BUN: 20 mg/dL (ref 6–23)
CHLORIDE: 105 meq/L (ref 96–112)
CO2: 27 mEq/L (ref 19–32)
Calcium: 9 mg/dL (ref 8.4–10.5)
Creatinine, Ser: 0.8 mg/dL (ref 0.50–1.35)
GFR calc Af Amer: >90 mL/min (ref >90)
Glucose, Bld: 53 mg/dL — ABNORMAL LOW (ref 70–99)
Potassium: 4.6 mEq/L (ref 3.7–5.3)
SODIUM: 142 meq/L (ref 137–147)

## 2014-07-31 LAB — CBC
HCT: 38.8 % — ABNORMAL LOW (ref 39.0–52.0)
Hemoglobin: 13.2 g/dL (ref 13.0–17.0)
MCH: 31.5 pg (ref 26.0–34.0)
MCHC: 34 g/dL (ref 30.0–36.0)
MCV: 92.6 fL (ref 78.0–100.0)
Platelets: 292 10*3/uL (ref 150–400)
RBC: 4.19 MIL/uL — ABNORMAL LOW (ref 4.22–5.81)
RDW: 13.4 % (ref 11.5–15.5)
WBC: 5.7 10*3/uL (ref 4.0–10.5)

## 2014-07-31 LAB — GLUCOSE, CAPILLARY
GLUCOSE-CAPILLARY: 95 mg/dL (ref 70–99)
GLUCOSE-CAPILLARY: 205 mg/dL — AB (ref 70–99)
Glucose-Capillary: 48 mg/dL — ABNORMAL LOW (ref 70–99)
Glucose-Capillary: 91 mg/dL (ref 70–99)
Glucose-Capillary: 214 mg/dL — ABNORMAL HIGH (ref 70–99)

## 2014-07-31 LAB — PROTIME-INR
INR: 0.95 (ref 0.00–1.49)
PROTHROMBIN TIME: 12.8 s (ref 11.6–15.2)

## 2014-07-31 LAB — POCT ACTIVATED CLOTTING TIME
Activated Clotting Time: 196 seconds
Activated Clotting Time: 227 seconds
Activated Clotting Time: 257 seconds
Activated Clotting Time: 257 seconds

## 2014-07-31 SURGERY — ANGIOGRAM, LOWER EXTREMITY
Anesthesia: LOCAL

## 2014-07-31 MED ORDER — CLOPIDOGREL BISULFATE 300 MG PO TABS
ORAL_TABLET | ORAL | Status: AC
  Filled 2014-07-31: qty 2

## 2014-07-31 MED ORDER — CILOSTAZOL 100 MG PO TABS: 100.0000 mg | ORAL_TABLET | Freq: Two times a day (BID) | ORAL | Status: DC

## 2014-07-31 MED ORDER — HEPARIN (PORCINE) IN NACL 2-0.9 UNIT/ML-% IJ SOLN
INTRAMUSCULAR | Status: AC
  Filled 2014-07-31: qty 500

## 2014-07-31 MED ORDER — HYDROMORPHONE HCL 1 MG/ML IJ SOLN
INTRAMUSCULAR | Status: AC
  Filled 2014-07-31: qty 1

## 2014-07-31 MED ORDER — MIDAZOLAM HCL 2 MG/2ML IJ SOLN
INTRAMUSCULAR | Status: AC
  Filled 2014-07-31: qty 2

## 2014-07-31 MED ORDER — SODIUM CHLORIDE 0.9 % IV SOLN: 1.0000 mL/kg/h | INTRAVENOUS | Status: DC

## 2014-07-31 MED ORDER — LIDOCAINE HCL (PF) 1 % IJ SOLN
INTRAMUSCULAR | Status: AC
  Filled 2014-07-31: qty 30

## 2014-07-31 MED ORDER — DEXTROSE 50 % IV SOLN
25.0000 mL | Freq: Once | INTRAVENOUS | Status: AC
  Administered 2014-07-31: 25 mL via INTRAVENOUS

## 2014-07-31 MED ORDER — HEPARIN SODIUM (PORCINE) 1000 UNIT/ML IJ SOLN
INTRAMUSCULAR | Status: AC
  Filled 2014-07-31: qty 1

## 2014-07-31 MED ORDER — HEPARIN (PORCINE) IN NACL 2-0.9 UNIT/ML-% IJ SOLN
INTRAMUSCULAR | Status: AC
  Filled 2014-07-31: qty 1000

## 2014-07-31 MED ORDER — DEXTROSE 50 % IV SOLN
INTRAVENOUS | Status: AC
  Administered 2014-07-31: 25 mL via INTRAVENOUS
  Filled 2014-07-31: qty 50

## 2014-07-31 MED ORDER — VANCOMYCIN HCL IN DEXTROSE 1-5 GM/200ML-% IV SOLN
1000.0000 mg | Freq: Once | INTRAVENOUS | Status: AC
  Administered 2014-07-31: 1000 mg via INTRAVENOUS
  Filled 2014-07-31: qty 200

## 2014-07-31 MED ORDER — SODIUM CHLORIDE 0.9 % IV BOLUS (SEPSIS)
500.0000 mL | Freq: Once | INTRAVENOUS | Status: AC
  Administered 2014-07-31: 500 mL via INTRAVENOUS

## 2014-07-31 MED ORDER — SODIUM CHLORIDE 0.9 % IV SOLN: INTRAVENOUS | Status: DC

## 2014-07-31 NOTE — Interval H&P Note (Signed)
History and Physical Interval Note:  07/31/2014 9:57 AM  [name]  has presented today for surgery, with the diagnosis of CLAUDICATION  The various methods of treatment have been discussed with the patient and family. After consideration of risks, benefits and other options for treatment, the patient has consented to  Procedure(s): LOWER EXTREMITY ANGIOGRAM (N/A) and possible PTA as a surgical intervention .  The patient's history has been reviewed, patient examined, no change in status, stable for surgery.  I have reviewed the patient's chart and labs.  Questions were answered to the patient's satisfaction.     Pamella PertGANJI,JAGADEESH R

## 2014-07-31 NOTE — Discharge Instructions (Signed)

## 2014-07-31 NOTE — CV Procedure (Signed)
Procedure performed: Abdominal aortogram with limited bilateral femoral arteriogram. Left femoral arterial access. Crossover from the left into the right femoral artery and placement of the catheter tip into the right superficial femoral artery. 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 Stenosis reduced from 100% to 0% with brisk flow, three-vessel runoff below the right knee. Left femoral arterial access angiogram followed by closure with Perclose.  Indication: Patient is a 63 year old Caucasian male with history of hyperlipidemia, diabetes mellitus, chronic tobacco use disorder, known peripheral arterial disease. He had undergone peripheral arteriogram on 05/29/2014, attempted angioplasty of the left SFA. Due to heavy calcification was unsuccessful. He had initially undergone angiography in June 2015 and this had revealed occluded right SFA also. He is now scheduled for repeat lower activity arteriogram to reevaluate his anatomy and for possible angioplasty of the right SFA.  Angiographic data: Abdominal aortogram: The aortoiliac bifurcation bilaterally was widely patent. Both the iliac vessels were widely patent with mild calcification. The proximal bilateral common femoral arteries were widely patent.  Right femoral artery with distal runoff: Right SFA is heavily calcified and is occluded in the proximal to mid segment. It reconstitutes just outside of the Hunter's canal. Below the right knee there is three-vessel runoff.  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  Stenosis reduced from 100% to 0% with brisk flow, three-vessel runoff below the right knee. A total of 88 mL of contrast was utilized for diagnostic and interventional procedure.  Recommendation: Patient will be discharged home with outpatient follow-up. Smoking cessation has been discussed extensively with the patient. He will need surveillance lower extremity arterial duplex.  Technique: Under sterile precautions using a 5 French left femoral arterial access, a Omni Flush catheter 5 JamaicaFrench was advanced into the abdominal aorta. Abdominal aortogram with limited bifemoral arteriogram was performed. Then using the same catheter I crossed over from the left femoral artery into the right femoral artery with the help of a Versacore wire. I placed Versacore wire in the proximal part of the right SFA. I then changed the short sheath a 5 JamaicaFrench long 55 cm sheath and placed into the proximal segment of the CTO.  Using heparin for anticoagulation, I then utilized Crosser 14 S atherectomy device with a backup support of a angled side take support catheter. Multiple therapies were delivered and atherectomy was performed in the proximal. However the catheter would not completely advance through the CTO. Hence at this point I utilized [employer]  Advantage Glidewire for support and with moderate to severe difficulty, I was able to advance through the complete length of the CTO and placed the tip of the wire into the popliteal artery. I then advanced the support sidekick catheter through the CTO and placed the tip of the catheter at the distal right SFA. Angiography confirmed true lumen position. I then exchanged the 5 [place]French long 55 cm sheath to a 6 French 55 cm sheath.  This was followed by exchanging the Glidewire to a long 290 cm Versacore wire. Then I decided to use 0.014 inch balloon system, hence I changed  to a 300 cm x 0.014" Mailman guidewire. Then I performed balloon angioplasty using a 5.0 x 200 cm Angiosculpt  balloon at 12 atmospheric pressure for 3 minutes 2 at the CTO site. Excellent result was evident without any major dissection and brisk flow was evident. Hence I decided to treat the lesion with a long 6 x 150 mm Lutonix drug-coated balloon at 4 atmospheric pressure for 2 minutes. Repeat angiography revealed excellent results without any evidence of dissection and brisk three-vessel runoff below the right knee. At this point I withdrew the balloon out and the wire out and pulled the 6 JamaicaFrench Ansel sheath into the left common femoral artery and left femoral arterial limb was performed followed by closure of the left femoral arterial access with Perclose with excellent hemostasis. Patient told the procedure well. There was no immediate complication.

## 2014-07-31 NOTE — H&P (Signed)
  Please see office visit notes for complete details of HPI.  

## 2014-08-02 ENCOUNTER — Encounter (HOSPITAL_COMMUNITY): Payer: Self-pay | Admitting: Cardiology

## 2014-09-01 ENCOUNTER — Encounter (HOSPITAL_COMMUNITY): Payer: Self-pay

## 2014-09-01 ENCOUNTER — Emergency Department (HOSPITAL_COMMUNITY)
Admission: EM | Admit: 2014-09-01 | Discharge: 2014-09-02 | Disposition: A | Discharge status: Home / Self Care | Payer: Self-pay | Attending: Emergency Medicine | Admitting: Emergency Medicine

## 2014-09-01 ENCOUNTER — Emergency Department (HOSPITAL_COMMUNITY): Payer: Self-pay

## 2014-09-01 DIAGNOSIS — S2232XA Fracture of one rib, left side, initial encounter for closed fracture: Secondary | ICD-10-CM | POA: Insufficient documentation

## 2014-09-01 DIAGNOSIS — E785 Hyperlipidemia, unspecified: Secondary | ICD-10-CM | POA: Insufficient documentation

## 2014-09-01 DIAGNOSIS — E119 Type 2 diabetes mellitus without complications: Secondary | ICD-10-CM | POA: Insufficient documentation

## 2014-09-01 DIAGNOSIS — Y9289 Other specified places as the place of occurrence of the external cause: Secondary | ICD-10-CM | POA: Insufficient documentation

## 2014-09-01 DIAGNOSIS — Y998 Other external cause status: Secondary | ICD-10-CM | POA: Insufficient documentation

## 2014-09-01 DIAGNOSIS — Z7952 Long term (current) use of systemic steroids: Secondary | ICD-10-CM | POA: Insufficient documentation

## 2014-09-01 DIAGNOSIS — Z79899 Other long term (current) drug therapy: Secondary | ICD-10-CM | POA: Insufficient documentation

## 2014-09-01 DIAGNOSIS — I2510 Atherosclerotic heart disease of native coronary artery without angina pectoris: Secondary | ICD-10-CM | POA: Insufficient documentation

## 2014-09-01 DIAGNOSIS — Z794 Long term (current) use of insulin: Secondary | ICD-10-CM | POA: Insufficient documentation

## 2014-09-01 DIAGNOSIS — K2970 Gastritis, unspecified, without bleeding: Secondary | ICD-10-CM | POA: Insufficient documentation

## 2014-09-01 DIAGNOSIS — W01198A Fall on same level from slipping, tripping and stumbling with subsequent striking against other object, initial encounter: Secondary | ICD-10-CM | POA: Insufficient documentation

## 2014-09-01 DIAGNOSIS — R079 Chest pain, unspecified: Secondary | ICD-10-CM

## 2014-09-01 DIAGNOSIS — Z720 Tobacco use: Secondary | ICD-10-CM | POA: Insufficient documentation

## 2014-09-01 DIAGNOSIS — Y9389 Activity, other specified: Secondary | ICD-10-CM | POA: Insufficient documentation

## 2014-09-01 LAB — BASIC METABOLIC PANEL
ANION GAP: 11 (ref 5–15)
BUN: 18 mg/dL (ref 6–23)
CHLORIDE: 99 meq/L (ref 96–112)
CO2: 24 mmol/L (ref 19–32)
Calcium: 9.2 mg/dL (ref 8.4–10.5)
Creatinine, Ser: 0.89 mg/dL (ref 0.50–1.35)
GFR calc non Af Amer: 90 mL/min — ABNORMAL LOW (ref >90)
Glucose, Bld: 42 mg/dL — CL (ref 70–99)
POTASSIUM: 3.8 mmol/L (ref 3.5–5.1)
Sodium: 134 mmol/L — ABNORMAL LOW (ref 135–145)

## 2014-09-01 LAB — HEPATIC FUNCTION PANEL
ALBUMIN: 3.9 g/dL (ref 3.5–5.2)
ALT: 16 U/L (ref 0–53)
AST: 21 U/L (ref 0–37)
Alkaline Phosphatase: 79 U/L (ref 39–117)
Total Bilirubin: 0.5 mg/dL (ref 0.3–1.2)
Total Protein: 6.5 g/dL (ref 6.0–8.3)

## 2014-09-01 LAB — CBC
HCT: 37.6 % — ABNORMAL LOW (ref 39.0–52.0)
HEMOGLOBIN: 13.5 g/dL (ref 13.0–17.0)
MCH: 33.6 pg (ref 26.0–34.0)
MCHC: 35.9 g/dL (ref 30.0–36.0)
MCV: 93.5 fL (ref 78.0–100.0)
PLATELETS: 321 10*3/uL (ref 150–400)
RBC: 4.02 MIL/uL — ABNORMAL LOW (ref 4.22–5.81)
RDW: 13.4 % (ref 11.5–15.5)
WBC: 10.2 10*3/uL (ref 4.0–10.5)

## 2014-09-01 LAB — I-STAT TROPONIN, ED: TROPONIN I, POC: 0 ng/mL (ref 0.00–0.08)

## 2014-09-01 LAB — CBG MONITORING, ED: Glucose-Capillary: 222 mg/dL — ABNORMAL HIGH (ref 70–99)

## 2014-09-01 MED ORDER — OMEPRAZOLE 20 MG PO CPDR
20.0000 mg | DELAYED_RELEASE_CAPSULE | Freq: Two times a day (BID) | ORAL | Status: DC
Start: 2014-09-01 — End: 2015-12-12

## 2014-09-01 MED ORDER — HYDROCODONE-ACETAMINOPHEN 5-325 MG PO TABS: 1.0000 | ORAL_TABLET | Freq: Four times a day (QID) | ORAL | Status: DC | PRN

## 2014-09-01 MED ORDER — GI COCKTAIL ~~LOC~~
30.0000 mL | Freq: Once | ORAL | Status: AC
  Administered 2014-09-01: 30 mL via ORAL
  Filled 2014-09-01: qty 30

## 2014-09-01 MED ORDER — DEXTROSE 50 % IV SOLN
50.0000 mL | Freq: Once | INTRAVENOUS | Status: AC
  Administered 2014-09-01: 50 mL via INTRAVENOUS
  Filled 2014-09-01: qty 50

## 2014-09-01 MED ORDER — HYDROCODONE-ACETAMINOPHEN 5-325 MG PO TABS
1.0000 | ORAL_TABLET | Freq: Once | ORAL | Status: AC
  Administered 2014-09-01: 1 via ORAL
  Filled 2014-09-01: qty 1

## 2014-09-01 MED ORDER — SODIUM CHLORIDE 0.9 % IV SOLN
Freq: Once | INTRAVENOUS | Status: AC
  Administered 2014-09-01: 23:00:00 via INTRAVENOUS
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
## 2014-09-01 NOTE — ED Provider Notes (Signed)
CSN: [id number]     Arrival date & time 09/01/14  2123 History   First MD Initiated Contact with Patient 09/01/14 2154     Chief Complaint  Patient presents with  . Chest Pain     (Consider location/radiation/quality/duration/timing/severity/associated sxs/prior Treatment) HPI Comments: Patient with vague upper abdominal pain and nausea that started before dinner about 6 PM and had persisted.  Took ASA with no relief  As an aside patient mentioned that he fell several days ago landing on the hitch of his trailer landing on left side bruising his ribs and he has been taking Advil 2 tabs at night.  Patient does have CAD, IDDM, GERD, Hyperlipidemia   Patient is a 64 y.o. male presenting with chest pain. The history is provided by the patient.  Chest Pain Pain location:  Epigastric Pain quality: aching   Pain radiates to:  Does not radiate Pain radiates to the back: no   Pain severity:  Moderate Onset quality:  Sudden Timing:  Constant Progression:  Unchanged Chronicity:  New Context: at rest   Context: not eating, no movement and not raising an arm   Relieved by:  Nothing Worsened by:  Nothing tried Ineffective treatments:  None tried Associated symptoms: abdominal pain   Associated symptoms: no cough, no diaphoresis, no nausea and no shortness of breath     Past Medical History  Diagnosis Date  . Coronary artery disease   . Peripheral vascular disease   . Diabetes mellitus   . Dyslipidemia   . Cigarette smoker    Past Surgical History  Procedure Laterality Date  . Cardiac catheterization      EF is 55-60% and no wall motion abnormalities  . Lower extremity angiogram N/A 01/23/2014    Procedure: LOWER EXTREMITY ANGIOGRAM;  Surgeon: Pamella PertJagadeesh R Ganji, MD;  [facility];  Service: Cardiovascular;  Laterality: N/A;  . Abdominal angiogram N/A 05/29/2014    Procedure: ABDOMINAL ANGIOGRAM;  Surgeon: Pamella PertJagadeesh R Ganji, MD;  [facility];  Service: Cardiovascular;   Laterality: N/A;  . Lower extremity angiogram N/A 07/31/2014    Procedure: LOWER EXTREMITY ANGIOGRAM;  Surgeon: Pamella PertJagadeesh R Ganji, MD;  [facility];  Service: Cardiovascular;  Laterality: N/A;   Family History  Problem Relation Age of Onset  . Coronary artery disease Mother    History  Substance Use Topics  . Smoking status: Current Every Day Smoker -- 1.00 packs/day  . Smokeless tobacco: Not on file  . Alcohol Use: No    Review of Systems  Constitutional: Negative for diaphoresis.  Respiratory: Negative for cough and shortness of breath.   Cardiovascular: Positive for chest pain.  Gastrointestinal: Positive for abdominal pain. Negative for nausea.  Genitourinary: Negative for dysuria.      Allergies  Simvastatin  Home Medications   Prior to Admission medications   Medication Sig Start Date End Date Taking? Authorizing Provider  aspirin EC 325 MG tablet Take 325 mg by mouth daily.    Historical Provider, MD  cilostazol (PLETAL) 100 MG tablet Take 1 tablet (100 mg total) by mouth 2 (two) times daily. 07/31/14   Pamella PertJagadeesh R Ganji, MD  folic acid (FOLVITE) 800 MCG tablet Take 1,600 mcg by mouth daily.     Historical Provider, MD  insulin aspart (NOVOLOG) 100 UNIT/ML injection Inject into the skin. Patient uses in Insulin Pump.    Historical Provider, MD  Insulin Human (INSULIN PUMP) 100 unit/ml SOLN Inject into the skin daily. Novolog  Historical Provider, MD  metoprolol succinate (TOPROL-XL) 25 MG 24 hr tablet Take 1 tablet (25 mg total) by mouth daily. 02/14/13   Vesta Mixer, MD  pravastatin (PRAVACHOL) 40 MG tablet Take 40 mg by mouth daily.  12/11/13   Historical Provider, MD  ramipril (ALTACE) 10 MG tablet Take 10 mg by mouth daily.      Historical Provider, MD   Pulse 81  Resp 17  Ht  (1.803 m)  Wt 165 lb (74.844 kg)  BMI 23.02 kg/m2  SpO2 100% Physical Exam  Constitutional: He appears well-developed and well-nourished.  HENT:  Head:  Normocephalic.  Eyes: Pupils are equal, round, and reactive to light.  Neck: Normal range of motion.  Cardiovascular: Normal rate and regular rhythm.   Pulmonary/Chest: Effort normal. He has no wheezes.  Abdominal: Soft. He exhibits no distension. There is no tenderness.  Musculoskeletal: Normal range of motion.  Neurological: He is alert.  Skin: Skin is warm.  Vitals reviewed.   ED Course  Procedures (including critical care time) Labs Review Labs Reviewed  CBC  BASIC METABOLIC PANEL  HEPATIC FUNCTION PANEL  I-STAT TROPOININ, ED    Imaging Review No results found.   EKG Interpretation None      MDM  Reexamines  No pain over the spleen area after eating and GI cocktail decreased discomfort  Final diagnoses:  Chest pain         [name], NP 09/01/14 2358  Juliet Rude. Rubin Payor, MD 09/02/14 [phone]

## 2014-09-01 NOTE — ED Notes (Signed)
[facility], c/o central CP, denies n/v, no SOB, given one nitro with minimal relief and 324 ASA PTA.

## 2014-11-23 DIAGNOSIS — R0781 Pleurodynia: Secondary | ICD-10-CM | POA: Insufficient documentation

## 2014-12-27 DIAGNOSIS — E10319 Type 1 diabetes mellitus with unspecified diabetic retinopathy without macular edema: Secondary | ICD-10-CM | POA: Insufficient documentation

## 2015-10-09 ENCOUNTER — Other Ambulatory Visit: Payer: Self-pay | Admitting: *Deleted

## 2015-10-09 DIAGNOSIS — M79605 Pain in left leg: Secondary | ICD-10-CM

## 2015-10-10 ENCOUNTER — Other Ambulatory Visit: Payer: Self-pay

## 2015-10-10 ENCOUNTER — Encounter: Payer: Self-pay | Admitting: Vascular Surgery

## 2015-10-10 ENCOUNTER — Ambulatory Visit (INDEPENDENT_AMBULATORY_CARE_PROVIDER_SITE_OTHER): Payer: BLUE CROSS/BLUE SHIELD | Admitting: Vascular Surgery

## 2015-10-10 ENCOUNTER — Ambulatory Visit (HOSPITAL_COMMUNITY)
Admission: RE | Admit: 2015-10-10 | Discharge: 2015-10-10 | Disposition: A | Discharge status: Home / Self Care | Payer: BLUE CROSS/BLUE SHIELD | Source: Ambulatory Visit | Attending: Vascular Surgery | Admitting: Vascular Surgery

## 2015-10-10 VITALS — BP 135/73 | HR 78 | Temp 97.5°F | Resp 16 | Ht 71.0 in | Wt 165.0 lb

## 2015-10-10 DIAGNOSIS — F17200 Nicotine dependence, unspecified, uncomplicated: Secondary | ICD-10-CM | POA: Insufficient documentation

## 2015-10-10 DIAGNOSIS — I739 Peripheral vascular disease, unspecified: Secondary | ICD-10-CM | POA: Diagnosis not present

## 2015-10-10 DIAGNOSIS — E785 Hyperlipidemia, unspecified: Secondary | ICD-10-CM | POA: Diagnosis not present

## 2015-10-10 DIAGNOSIS — R0989 Other specified symptoms and signs involving the circulatory and respiratory systems: Secondary | ICD-10-CM | POA: Diagnosis present

## 2015-10-10 DIAGNOSIS — M79605 Pain in left leg: Secondary | ICD-10-CM | POA: Diagnosis not present

## 2015-10-10 DIAGNOSIS — E119 Type 2 diabetes mellitus without complications: Secondary | ICD-10-CM | POA: Diagnosis not present

## 2015-10-10 DIAGNOSIS — R938 Abnormal findings on diagnostic imaging of other specified body structures: Secondary | ICD-10-CM | POA: Insufficient documentation

## 2015-10-10 NOTE — Progress Notes (Signed)
Filed Vitals:   10/10/15 1112 10/10/15 1118  BP: 146/67 135/73  Pulse: 79 78  Temp: 97.5 F (36.4 C)   TempSrc: Oral   Resp: 16   Height:  (1.803 m)   Weight: 165 lb (74.844 kg)   SpO2: 98%

## 2015-10-10 NOTE — Progress Notes (Signed)
[facility]  Patient name: [name] MRN: [id number] DOB: 15-May-1951 Sex: male  REASON FOR CONSULT: Nonhealing wound left foot and the face of PAD Referring physician: Aldean Baker MD  HPI: [name] is a 65 y.o. male, who resents with a nonhealing wound of his left foot. This started approximately a week ago. He developed an ulcer on the lateral aspect of his left fifth toe. He then developed redness extending into the left forefoot. He was seen by Dr. Timothy Lasso approximately 5 days ago and started on antibiotics. This was for a 10 day course. He saw Dr. Lajoyce Corners 2 days ago and was referred to our office. The patient previously had seen Dr. Myra Gianotti for mild claudication in 2012. At that point it was recommended that he try to quit smoking and he was started on Pletal.  He is on Pletal aspirin and a statin. He currently smokes 1 pack of cigarettes per day. Greater than 3 minutes today spent regarding smoking cessation counseling. He was seen by Dr Nadara Eaton in June 2015 and noted to have a left superficial femoral artery occlusion. Dr Nadara Eaton was unable to pass a wire across this. The patient subsequently had a right superficial femoral artery atherectomy and angioplasty by Dr Nadara Eaton in December 2015. This was noted to be occluded 6 months later. The patient does not really describe claudication symptoms currently.  Chronic medical problems include coronary artery disease, diabetes, hyperlipidemia of which are currently stable.  Past Medical History  Diagnosis Date  . Coronary artery disease   . Peripheral vascular disease   . Diabetes mellitus   . Dyslipidemia   . Cigarette smoker     Family History  Problem Relation Age of Onset  . Coronary artery disease Mother     SOCIAL HISTORY: Social History   Social History  . Marital Status: Widowed    Spouse Name: N/A  . Number of Children: N/A  . Years of Education: N/A   Occupational History  . Not on file.     Social History Main Topics  . Smoking status: Current Every Day Smoker -- 1.00 packs/day  . Smokeless tobacco: Not on file  . Alcohol Use: No  . Drug Use: Not on file  . Sexual Activity: Not on file   Other Topics Concern  . Not on file   Social History Narrative    Allergies  Allergen Reactions  . Simvastatin Other (See Comments)    Leg aches, muscle weakness     Current Outpatient Prescriptions  Medication Sig Dispense Refill  . aspirin EC 325 MG tablet Take 325 mg by mouth daily.    . cilostazol (PLETAL) 100 MG tablet Take 1 tablet (100 mg total) by mouth 2 (two) times daily. (Patient taking differently: Take 100 mg by mouth at bedtime. ) 60 tablet 6  . folic acid (FOLVITE) 800 MCG tablet Take 1,600 mcg by mouth daily.     Marland Kitchen HYDROcodone-acetaminophen (NORCO/VICODIN) 5-325 MG per tablet Take 1 tablet by mouth every 6 (six) hours as needed for moderate pain. 30 tablet 0  . insulin aspart (NOVOLOG) 100 UNIT/ML injection Inject into the skin. Patient uses in Insulin Pump.    . Insulin Human (INSULIN PUMP) 100 unit/ml SOLN Inject into the skin daily. Novolog    . metoprolol succinate (TOPROL-XL) 25 MG 24 hr tablet Take 1 tablet (25 mg total) by mouth daily. 30 tablet 1  . omeprazole (PRILOSEC) 20 MG capsule Take 1  capsule (20 mg total) by mouth 2 (two) times daily before a meal. BID for 7 days than daily 40 capsule 0  . pravastatin (PRAVACHOL) 40 MG tablet Take 40 mg by mouth daily.     . ramipril (ALTACE) 10 MG tablet Take 10 mg by mouth daily.       No current facility-administered medications for this visit.    REVIEW OF SYSTEMS:  [X]  denotes positive finding, [ ]  denotes negative finding Cardiac  Comments:  Chest pain or chest pressure:    Shortness of breath upon exertion:    Short of breath when lying flat:    Irregular heart rhythm:        Vascular    Pain in calf, thigh, or hip brought on by ambulation:    Pain in feet at night that wakes you up from your  sleep:     Blood clot in your veins:    Leg swelling:         Pulmonary    Oxygen at home:    Productive cough:     Wheezing:         Neurologic    Sudden weakness in arms or legs:     Sudden numbness in arms or legs:     Sudden onset of difficulty speaking or slurred speech:    Temporary loss of vision in one eye:     Problems with dizziness:         Gastrointestinal    Blood in stool:     Vomited blood:         Genitourinary    Burning when urinating:     Blood in urine:        Psychiatric    Major depression:         Hematologic    Bleeding problems:    Problems with blood clotting too easily:        Skin    Rashes or ulcers:        Constitutional    Fever or chills:      PHYSICAL EXAM:  Filed Vitals:   10/10/15 1112 10/10/15 1118  BP: 146/67 135/73  Pulse: 79 78  Temp: 97.5 F (36.4 C)   TempSrc: Oral   Resp: 16   Height: 5\' 11"  (1.803 m)   Weight: 165 lb (74.844 kg)   SpO2: 98%     GENERAL: The patient is a well-nourished male, in no acute distress. The vital signs are documented above. CARDIAC: There is a regular rate and rhythm.  VASCULAR: 2+ femoral pulses bilaterally absent popliteal and pedal pulses bilaterally PULMONARY: There is good air exchange bilaterally without wheezing or rales. ABDOMEN: Soft and non-tender with normal pitched bowel sounds.  MUSCULOSKELETAL: There are no major deformities or cyanosis. Left fifth toe 2 cm ulceration laterally, erythema diffusely extending to the mid forefoot left side, no fluctuance NEUROLOGIC: No focal weakness or paresthesias are detected. SKIN: There are rashes noted. Ulcer and erythema as described above PSYCHIATRIC: The patient has a normal affect.  DATA:  I reviewed the patient's duplex ultrasound dated 02/14/2015 from [place] [facility]. This showed bilateral superficial femoral artery occlusions. The patient had bilateral ABIs and toe pressures in our office today. Toe pressure on the left  was 36 ABI was 1 but most likely artificially elevated due to calcification. Vessels were noncompressible on the right side. Toe pressure on the right was 78.  MEDICAL ISSUES: Limb threatening ischemia with infection left foot. I discussed  the patient today that at this point he most likely needs a bypass since they were unable to successfully angioplasty this lesion in the past. He did mention the possibility of a retrograde angioplasty in [place] which had been described by Dr. Nadara Eaton. However, I counseled the patient that this would have less durability than a bypass procedure. I also discussed with the patient that he needs to quit smoking or durability of any intervention is going to be limited. I offered the patient arteriogram tomorrow to reevaluate his lower external circulation for purposes of planning a bypass procedure. Dr. Myra Gianotti has a slot available for him tomorrow. The patient was going to go home to see if he can make arrangements to make the procedure tomorrow. If not we will try to do his arteriogram early next week. He also needs cardiac risk stratification prior to this. We will make an appointment with Dr. Nadara Eaton regarding this.   Fabienne Bruns [facility] [employer] [facility] [employer]: [phone]

## 2015-10-11 ENCOUNTER — Encounter: Payer: Self-pay | Admitting: Cardiology

## 2015-10-11 ENCOUNTER — Encounter (HOSPITAL_COMMUNITY)
Admission: RE | Disposition: A | Discharge status: Home / Self Care | Payer: Self-pay | Source: Ambulatory Visit | Attending: Surgery

## 2015-10-11 ENCOUNTER — Other Ambulatory Visit: Payer: Self-pay | Admitting: *Deleted

## 2015-10-11 ENCOUNTER — Ambulatory Visit (HOSPITAL_COMMUNITY)
Admission: RE | Admit: 2015-10-11 | Discharge: 2015-10-11 | Disposition: A | Discharge status: Home / Self Care | Payer: BLUE CROSS/BLUE SHIELD | Source: Ambulatory Visit | Attending: Surgery | Admitting: Surgery

## 2015-10-11 DIAGNOSIS — F17210 Nicotine dependence, cigarettes, uncomplicated: Secondary | ICD-10-CM | POA: Diagnosis not present

## 2015-10-11 DIAGNOSIS — Z794 Long term (current) use of insulin: Secondary | ICD-10-CM | POA: Diagnosis not present

## 2015-10-11 DIAGNOSIS — L97529 Non-pressure chronic ulcer of other part of left foot with unspecified severity: Secondary | ICD-10-CM | POA: Diagnosis not present

## 2015-10-11 DIAGNOSIS — I739 Peripheral vascular disease, unspecified: Secondary | ICD-10-CM | POA: Diagnosis present

## 2015-10-11 DIAGNOSIS — I2510 Atherosclerotic heart disease of native coronary artery without angina pectoris: Secondary | ICD-10-CM | POA: Diagnosis not present

## 2015-10-11 DIAGNOSIS — I70245 Atherosclerosis of native arteries of left leg with ulceration of other part of foot: Secondary | ICD-10-CM | POA: Diagnosis not present

## 2015-10-11 DIAGNOSIS — Z7982 Long term (current) use of aspirin: Secondary | ICD-10-CM | POA: Insufficient documentation

## 2015-10-11 DIAGNOSIS — E785 Hyperlipidemia, unspecified: Secondary | ICD-10-CM | POA: Insufficient documentation

## 2015-10-11 DIAGNOSIS — E1151 Type 2 diabetes mellitus with diabetic peripheral angiopathy without gangrene: Secondary | ICD-10-CM | POA: Insufficient documentation

## 2015-10-11 DIAGNOSIS — I7025 Atherosclerosis of native arteries of other extremities with ulceration: Secondary | ICD-10-CM

## 2015-10-11 HISTORY — PX: PERIPHERAL VASCULAR CATHETERIZATION: SHX172C

## 2015-10-11 HISTORY — PX: LOWER EXTREMITY ANGIOGRAM: SHX5508

## 2015-10-11 LAB — GLUCOSE, CAPILLARY
GLUCOSE-CAPILLARY: 115 mg/dL — AB (ref 65–99)
GLUCOSE-CAPILLARY: 84 mg/dL (ref 65–99)

## 2015-10-11 LAB — POCT ACTIVATED CLOTTING TIME
ACTIVATED CLOTTING TIME: 204 s
Activated Clotting Time: 214 seconds
Activated Clotting Time: 188 seconds

## 2015-10-11 LAB — POCT I-STAT, CHEM 8
BUN: 22 mg/dL — AB (ref 6–20)
CALCIUM ION: 1.16 mmol/L (ref 1.13–1.30)
CHLORIDE: 102 mmol/L (ref 101–111)
Creatinine, Ser: 0.9 mg/dL (ref 0.61–1.24)
GLUCOSE: 194 mg/dL — AB (ref 65–99)
HCT: 39 % (ref 39.0–52.0)
Hemoglobin: 13.3 g/dL (ref 13.0–17.0)
POTASSIUM: 4.9 mmol/L (ref 3.5–5.1)
Sodium: 137 mmol/L (ref 135–145)
TCO2: 26 mmol/L (ref 0–100)

## 2015-10-11 SURGERY — PERIPHERAL VASCULAR BALLOON ANGIOPLASTY

## 2015-10-11 MED ORDER — SODIUM CHLORIDE 0.9 % IV SOLN: 1.0000 mL/kg/h | INTRAVENOUS | Status: DC

## 2015-10-11 MED ORDER — MIDAZOLAM HCL 2 MG/2ML IJ SOLN
INTRAMUSCULAR | Status: AC
  Filled 2015-10-11: qty 2

## 2015-10-11 MED ORDER — HEPARIN (PORCINE) IN NACL 2-0.9 UNIT/ML-% IJ SOLN
INTRAMUSCULAR | Status: AC
  Filled 2015-10-11: qty 1000

## 2015-10-11 MED ORDER — MIDAZOLAM HCL 2 MG/2ML IJ SOLN
INTRAMUSCULAR | Status: DC | PRN
  Administered 2015-10-11: 2 mg via INTRAVENOUS
  Administered 2015-10-11 (×2): 1 mg via INTRAVENOUS

## 2015-10-11 MED ORDER — FENTANYL CITRATE (PF) 100 MCG/2ML IJ SOLN
INTRAMUSCULAR | Status: DC | PRN
  Administered 2015-10-11: 50 ug via INTRAVENOUS
  Administered 2015-10-11 (×2): 25 ug via INTRAVENOUS

## 2015-10-11 MED ORDER — HEPARIN (PORCINE) IN NACL 2-0.9 UNIT/ML-% IJ SOLN
INTRAMUSCULAR | Status: DC | PRN
  Administered 2015-10-11: 13:00:00

## 2015-10-11 MED ORDER — LIDOCAINE HCL (PF) 1 % IJ SOLN
INTRAMUSCULAR | Status: AC
  Filled 2015-10-11: qty 30

## 2015-10-11 MED ORDER — SODIUM CHLORIDE 0.9 % IV SOLN
INTRAVENOUS | Status: DC
  Administered 2015-10-11: 11:00:00 via INTRAVENOUS

## 2015-10-11 MED ORDER — FENTANYL CITRATE (PF) 100 MCG/2ML IJ SOLN
INTRAMUSCULAR | Status: AC
  Filled 2015-10-11: qty 2

## 2015-10-11 MED ORDER — HEPARIN SODIUM (PORCINE) 1000 UNIT/ML IJ SOLN
INTRAMUSCULAR | Status: DC | PRN
  Administered 2015-10-11: 8000 [IU] via INTRAVENOUS

## 2015-10-11 MED ORDER — LIDOCAINE HCL (PF) 1 % IJ SOLN
INTRAMUSCULAR | Status: DC | PRN
  Administered 2015-10-11: 12 mL

## 2015-10-11 SURGICAL SUPPLY — 21 items
CATH OMNI FLUSH 5F 65CM (CATHETERS) ×2 IMPLANT
CATH QUICKCROSS .035X135CM (MICROCATHETER) ×2 IMPLANT
COVER PRB 48X5XTLSCP FOLD TPE (BAG) IMPLANT
COVER PROBE 5X48 (BAG) ×4
DEVICE CONTINUOUS FLUSH (MISCELLANEOUS) ×2 IMPLANT
DEVICE TORQUE H2O (MISCELLANEOUS) ×2 IMPLANT
DRAPE ZERO GRAVITY STERILE (DRAPES) ×2 IMPLANT
GUIDEWIRE ANGLED .035X150CM (WIRE) ×2 IMPLANT
GUIDEWIRE ANGLED .035X260CM (WIRE) ×4 IMPLANT
KIT MICROINTRODUCER STIFF 5F (SHEATH) ×2 IMPLANT
KIT PV (KITS) ×4 IMPLANT
SHEATH PINNACLE 5F 10CM (SHEATH) ×2 IMPLANT
SHEATH PINNACLE 7F 10CM (SHEATH) ×2 IMPLANT
SHEATH PINNACLE ST 7F 45CM (SHEATH) ×4 IMPLANT
SHIELD RADPAD SCOOP 12X17 (MISCELLANEOUS) ×2 IMPLANT
SYR MEDRAD MARK V 150ML (SYRINGE) ×4 IMPLANT
TAPE RADIOPAQUE TURBO (MISCELLANEOUS) ×2 IMPLANT
TRANSDUCER W/STOPCOCK (MISCELLANEOUS) ×4 IMPLANT
TRAY PV CATH (CUSTOM PROCEDURE TRAY) ×4 IMPLANT
WIRE BENTSON .035X145CM (WIRE) ×2 IMPLANT
WIRE TREASURE-12 .018X300CM (WIRE) ×2 IMPLANT

## 2015-10-11 NOTE — Op Note (Addendum)
Patient name: [name] MRN: [id number] DOB: 1951/05/27 Sex: male  10/11/2015 Pre-operative Diagnosis: Left leg ulcer Post-operative diagnosis:  Same Surgeon:  Durene Cal Procedure Performed:  1.  Ultrasound-guided access right femoral artery  2.  Abdominal aortogram  3.  Left leg runoff  4.  Third order catheterization  5.  Failed angioplasty, left superficial femoral and popliteal artery  6.  Conscious sedation 12:52- 14:07   Indications:  The patient has a history of peripheral vascular disease.  He has a new onset ulcer to the left foot.  He comes in today for angiographic evaluation.  Procedure:  The patient was identified in the holding area and taken to room 8.  The patient was then placed supine on the table and prepped and draped in the usual sterile fashion.  A time out was called.  Conscious sedation was performed with the use of IV fentanyl and Versed under continuous physician monitoring with the assistance of the nurse.  We were present 100% of the time.  Monitoring oxygen saturation, blood pressure and heart rate.  Ultrasound was used to evaluate the right common femoral artery.  It was patent .  A digital ultrasound image was acquired.  A micropuncture needle was used to access the right common femoral artery under ultrasound guidance.  An 018 wire was advanced without resistance and a micropuncture sheath was placed.  The 018 wire was removed and a benson wire was placed.  The micropuncture sheath was exchanged for a 5 french sheath.  An omniflush catheter was advanced over the wire to the level of L-1.  An abdominal angiogram was obtained.  Next, using the omniflush catheter and a benson wire, the aortic bifurcation was crossed and the catheter was placed into theleft external iliac artery and left runoff was obtained.    Findings:   Aortogram:  No significant renal artery stenosis.  The infrarenal abdominal aorta is widely patent without significant stenosis.   Bilateral common and external iliac arteries are widely patent  Right Lower Extremity:  Not evaluated  Left Lower Extremity:  The left common femoral and profunda femoral artery are patent.  The superficial femoral artery is diseased and calcified proximally.  It occludes in the midportion with reconstitution of the above-knee popliteal artery at the level of the patella.  The below knee popliteal artery is patent throughout it's course.  There is two-vessel runoff via the anterior tibial and peroneal artery.  The posterior tibial is occluded  Intervention:  After the above images were acquired the decision was made to proceed with intervention.  Over a 035 wire, a 7 French sheath was advanced into the left superficial femoral artery.  The patient was fully heparinized.  Some intimal recanalization was attempted with multiple catheters and wires including a 035 Glidewire with a quick cross as well as a treasure 12 wire.  The plaque was too calcified and dense to get across and therefore I elected to terminate the procedure with plans for surgical intervention.  The sheath was withdrawn to the right external iliac artery and the patient will be taken to the holding area for sheath pull.  Impression:  #1  no significant aortoiliac occlusive disease.  #2  occlusion of the left superficial femoral and popliteal artery with two-vessel runoff via the anterior tibial and peroneal artery.  #3  plan for left femoral to below knee popliteal artery bypass    V. Durene Cal, M.D. [facility] [place] Office:  [phone] Pager:  [phone]

## 2015-10-11 NOTE — Interval H&P Note (Signed)
History and Physical Interval Note:  10/11/2015 12:34 PM  [name]  has presented today for surgery, with the diagnosis of pvd  The various methods of treatment have been discussed with the patient and family. After consideration of risks, benefits and other options for treatment, the patient has consented to  Procedure(s): Abdominal Aortogram (N/A) as a surgical intervention .  The patient's history has been reviewed, patient examined, no change in status, stable for surgery.  I have reviewed the patient's chart and labs.  Questions were answered to the patient's satisfaction.     Durene Cal

## 2015-10-11 NOTE — Progress Notes (Signed)
Site area: RFA Site Prior to Removal:  Level 0 Pressure Applied For:20 min Manual: yes   Patient Status During Pull:  stable Post Pull Site:  Level 0 Post Pull Instructions Given:  yes Post Pull Pulses Present: palpable Dressing Applied:  clear Bedrest begins @ 1530 till 1930 Comments:

## 2015-10-11 NOTE — Progress Notes (Signed)
Up and walked and tolerated well; right groin stable no bleeding or hematoma 

## 2015-10-11 NOTE — H&P (View-Only) (Signed)
 [facility] [place]  Patient name: [name] MRN: [id number] DOB: 10/02/1950 Sex: male  REASON FOR CONSULT: Nonhealing wound left foot and the face of PAD Referring physician: Marcus Duda MD  HPI: [name] is a 64 y.o. male, who resents with a nonhealing wound of his left foot. This started approximately a week ago. He developed an ulcer on the lateral aspect of his left fifth toe. He then developed redness extending into the left forefoot. He was seen by Dr. Russo approximately 5 days ago and started on antibiotics. This was for a 10 day course. He saw Dr. Duda 2 days ago and was referred to our office. The patient previously had seen Dr. Brabham for mild claudication in 2012. At that point it was recommended that he try to quit smoking and he was started on Pletal.  He is on Pletal aspirin and a statin. He currently smokes 1 pack of cigarettes per day. Greater than 3 minutes today spent regarding smoking cessation counseling. He was seen by Dr Gangi in June 2015 and noted to have a left superficial femoral artery occlusion. Dr Gangi was unable to pass a wire across this. The patient subsequently had a right superficial femoral artery atherectomy and angioplasty by Dr Gangi in December 2015. This was noted to be occluded 6 months later. The patient does not really describe claudication symptoms currently.  Chronic medical problems include coronary artery disease, diabetes, hyperlipidemia of which are currently stable.  Past Medical History  Diagnosis Date  . Coronary artery disease   . Peripheral vascular disease   . Diabetes mellitus   . Dyslipidemia   . Cigarette smoker     Family History  Problem Relation Age of Onset  . Coronary artery disease Mother     SOCIAL HISTORY: Social History   Social History  . Marital Status: Widowed    Spouse Name: N/A  . Number of Children: N/A  . Years of Education: N/A   Occupational History  . Not on file.     Social History Main Topics  . Smoking status: Current Every Day Smoker -- 1.00 packs/day  . Smokeless tobacco: Not on file  . Alcohol Use: No  . Drug Use: Not on file  . Sexual Activity: Not on file   Other Topics Concern  . Not on file   Social History Narrative    Allergies  Allergen Reactions  . Simvastatin Other (See Comments)    Leg aches, muscle weakness     Current Outpatient Prescriptions  Medication Sig Dispense Refill  . aspirin EC 325 MG tablet Take 325 mg by mouth daily.    . cilostazol (PLETAL) 100 MG tablet Take 1 tablet (100 mg total) by mouth 2 (two) times daily. (Patient taking differently: Take 100 mg by mouth at bedtime. ) 60 tablet 6  . folic acid (FOLVITE) 800 MCG tablet Take 1,600 mcg by mouth daily.     . HYDROcodone-acetaminophen (NORCO/VICODIN) 5-325 MG per tablet Take 1 tablet by mouth every 6 (six) hours as needed for moderate pain. 30 tablet 0  . insulin aspart (NOVOLOG) 100 UNIT/ML injection Inject into the skin. Patient uses in Insulin Pump.    . Insulin Human (INSULIN PUMP) 100 unit/ml SOLN Inject into the skin daily. Novolog    . metoprolol succinate (TOPROL-XL) 25 MG 24 hr tablet Take 1 tablet (25 mg total) by mouth daily. 30 tablet 1  . omeprazole (PRILOSEC) 20 MG capsule Take 1   capsule (20 mg total) by mouth 2 (two) times daily before a meal. BID for 7 days than daily 40 capsule 0  . pravastatin (PRAVACHOL) 40 MG tablet Take 40 mg by mouth daily.     . ramipril (ALTACE) 10 MG tablet Take 10 mg by mouth daily.       No current facility-administered medications for this visit.    REVIEW OF SYSTEMS:  [X] denotes positive finding, [ ] denotes negative finding Cardiac  Comments:  Chest pain or chest pressure:    Shortness of breath upon exertion:    Short of breath when lying flat:    Irregular heart rhythm:        Vascular    Pain in calf, thigh, or hip brought on by ambulation:    Pain in feet at night that wakes you up from your  sleep:     Blood clot in your veins:    Leg swelling:         Pulmonary    Oxygen at home:    Productive cough:     Wheezing:         Neurologic    Sudden weakness in arms or legs:     Sudden numbness in arms or legs:     Sudden onset of difficulty speaking or slurred speech:    Temporary loss of vision in one eye:     Problems with dizziness:         Gastrointestinal    Blood in stool:     Vomited blood:         Genitourinary    Burning when urinating:     Blood in urine:        Psychiatric    Major depression:         Hematologic    Bleeding problems:    Problems with blood clotting too easily:        Skin    Rashes or ulcers:        Constitutional    Fever or chills:      PHYSICAL EXAM:  Filed Vitals:   10/10/15 1112 10/10/15 1118  BP: 146/67 135/73  Pulse: 79 78  Temp: 97.5 F (36.4 C)   TempSrc: Oral   Resp: 16   Height: 5' 11" (1.803 m)   Weight: 165 lb (74.844 kg)   SpO2: 98%     GENERAL: The patient is a well-nourished male, in no acute distress. The vital signs are documented above. CARDIAC: There is a regular rate and rhythm.  VASCULAR: 2+ femoral pulses bilaterally absent popliteal and pedal pulses bilaterally PULMONARY: There is good air exchange bilaterally without wheezing or rales. ABDOMEN: Soft and non-tender with normal pitched bowel sounds.  MUSCULOSKELETAL: There are no major deformities or cyanosis. Left fifth toe 2 cm ulceration laterally, erythema diffusely extending to the mid forefoot left side, no fluctuance NEUROLOGIC: No focal weakness or paresthesias are detected. SKIN: There are rashes noted. Ulcer and erythema as described above PSYCHIATRIC: The patient has a normal affect.  DATA:  I reviewed the patient's duplex ultrasound dated 02/14/2015 from [facility]. This showed bilateral superficial femoral artery occlusions. The patient had bilateral ABIs and toe pressures in our office today. Toe pressure on the left  was 36 ABI was 1 but most likely artificially elevated due to calcification. Vessels were noncompressible on the right side. Toe pressure on the right was 78.  MEDICAL ISSUES: Limb threatening ischemia with infection left foot. I discussed   the patient today that at this point he most likely needs a bypass since they were unable to successfully angioplasty this lesion in the past. He did mention the possibility of a retrograde angioplasty in [place] which had been described by Dr. Gangi. However, I counseled the patient that this would have less durability than a bypass procedure. I also discussed with the patient that he needs to quit smoking or durability of any intervention is going to be limited. I offered the patient arteriogram tomorrow to reevaluate his lower external circulation for purposes of planning a bypass procedure. Dr. Brabham has a slot available for him tomorrow. The patient was going to go home to see if he can make arrangements to make the procedure tomorrow. If not we will try to do his arteriogram early next week. He also needs cardiac risk stratification prior to this. We will make an appointment with Dr. Gangi regarding this.   Fields, Charles [facility] [place] Beeper: [phone]      

## 2015-10-11 NOTE — Discharge Instructions (Signed)

## 2015-10-14 ENCOUNTER — Telehealth: Payer: Self-pay | Admitting: Surgery

## 2015-10-14 ENCOUNTER — Encounter (HOSPITAL_COMMUNITY): Payer: Self-pay | Admitting: Surgery

## 2015-10-14 NOTE — Telephone Encounter (Addendum)
sched vein mapping for 10/17/15, spoke to pt to inform him of appt.  Faxed cardio clearance for to Dr. Verl Dicker office.  ----- Message from Glori Bickers, RN sent at 10/14/2015 10:59 AM EST ----- Regarding: FW: Schedule for upcoming surgery Please see below.   Thanks, Judeth Cornfield ----- Message -----    From: Sharee Pimple, RN    Sent: 10/11/2015   3:33 PM      To: Glori Bickers, RN, [facility] Admin Pool Subject: Schedule for upcoming surgery                    ----- Message -----    From: Nada Libman, MD    Sent: 10/11/2015   2:30 PM      To: [facility] Charge Pool  10/11/2015: Surgeon:  Durene Cal Procedure Performed:  1.  Ultrasound-guided access right femoral artery  2.  Abdominal aortogram  3.  Left leg runoff  4.  Third order catheterization  5.  Failed angioplasty, left superficial femoral and popliteal artery  6.  Conscious sedation 12:52- 14:07  The patient will need cardiac clearance from Dr. Jacinto Halim He will also need lower extremity vein mapping.  Once he has cardiac clearance, he'll need to be scheduled for a left femoral to below knee popliteal artery bypass graft.  He will need to be off of his Pletal for at least 5 days.  He has an ulcer so this needs to be expedited.

## 2015-10-15 ENCOUNTER — Encounter: Payer: Self-pay | Admitting: *Deleted

## 2015-10-15 ENCOUNTER — Other Ambulatory Visit: Payer: Self-pay | Admitting: *Deleted

## 2015-10-17 ENCOUNTER — Ambulatory Visit (HOSPITAL_COMMUNITY)
Admission: RE | Admit: 2015-10-17 | Discharge: 2015-10-17 | Disposition: A | Discharge status: Home / Self Care | Payer: BLUE CROSS/BLUE SHIELD | Source: Ambulatory Visit | Attending: Vascular Surgery | Admitting: Vascular Surgery

## 2015-10-17 DIAGNOSIS — E785 Hyperlipidemia, unspecified: Secondary | ICD-10-CM | POA: Insufficient documentation

## 2015-10-17 DIAGNOSIS — I8393 Asymptomatic varicose veins of bilateral lower extremities: Secondary | ICD-10-CM | POA: Diagnosis not present

## 2015-10-17 DIAGNOSIS — I7025 Atherosclerosis of native arteries of other extremities with ulceration: Secondary | ICD-10-CM | POA: Diagnosis not present

## 2015-10-17 DIAGNOSIS — Z01818 Encounter for other preprocedural examination: Secondary | ICD-10-CM | POA: Diagnosis not present

## 2015-10-17 DIAGNOSIS — F17210 Nicotine dependence, cigarettes, uncomplicated: Secondary | ICD-10-CM | POA: Insufficient documentation

## 2015-10-17 DIAGNOSIS — E119 Type 2 diabetes mellitus without complications: Secondary | ICD-10-CM | POA: Insufficient documentation

## 2015-10-22 ENCOUNTER — Encounter (HOSPITAL_COMMUNITY)
Admission: RE | Admit: 2015-10-22 | Discharge: 2015-10-22 | Disposition: A | Discharge status: Home / Self Care | Payer: BLUE CROSS/BLUE SHIELD | Source: Ambulatory Visit | Attending: Surgery | Admitting: Surgery

## 2015-10-22 ENCOUNTER — Encounter (HOSPITAL_COMMUNITY): Payer: Self-pay

## 2015-10-22 HISTORY — DX: Type 2 diabetes mellitus with unspecified diabetic retinopathy without macular edema: E11.319

## 2015-10-22 LAB — COMPREHENSIVE METABOLIC PANEL
ALBUMIN: 3.5 g/dL (ref 3.5–5.0)
ALT: 14 U/L — AB (ref 17–63)
ANION GAP: 6 (ref 5–15)
AST: 15 U/L (ref 15–41)
Alkaline Phosphatase: 77 U/L (ref 38–126)
BUN: 15 mg/dL (ref 6–20)
CHLORIDE: 104 mmol/L (ref 101–111)
CO2: 26 mmol/L (ref 22–32)
CREATININE: 0.85 mg/dL (ref 0.61–1.24)
Calcium: 9 mg/dL (ref 8.9–10.3)
GFR calc non Af Amer: >60 mL/min (ref >60)
Glucose, Bld: 305 mg/dL — ABNORMAL HIGH (ref 65–99)
Potassium: 4.9 mmol/L (ref 3.5–5.1)
SODIUM: 136 mmol/L (ref 135–145)
Total Bilirubin: 0.5 mg/dL (ref 0.3–1.2)
Total Protein: 6.6 g/dL (ref 6.5–8.1)

## 2015-10-22 LAB — URINALYSIS, ROUTINE W REFLEX MICROSCOPIC
BILIRUBIN URINE: NEGATIVE
HGB URINE DIPSTICK: NEGATIVE
Ketones, ur: NEGATIVE mg/dL
Leukocytes, UA: NEGATIVE
Nitrite: NEGATIVE
PH: 5 (ref 5.0–8.0)
Protein, ur: NEGATIVE mg/dL
SPECIFIC GRAVITY, URINE: 1.011 (ref 1.005–1.030)

## 2015-10-22 LAB — TYPE AND SCREEN
ABO/RH(D): A POS
Antibody Screen: NEGATIVE

## 2015-10-22 LAB — CBC
HCT: 38.5 % — ABNORMAL LOW (ref 39.0–52.0)
Hemoglobin: 13 g/dL (ref 13.0–17.0)
MCH: 31.4 pg (ref 26.0–34.0)
MCHC: 33.8 g/dL (ref 30.0–36.0)
MCV: 93 fL (ref 78.0–100.0)
PLATELETS: 456 10*3/uL — AB (ref 150–400)
RBC: 4.14 MIL/uL — AB (ref 4.22–5.81)
RDW: 13.5 % (ref 11.5–15.5)
WBC: 7.7 10*3/uL (ref 4.0–10.5)

## 2015-10-22 LAB — URINE MICROSCOPIC-ADD ON
Bacteria, UA: NONE SEEN
RBC / HPF: NONE SEEN RBC/hpf (ref 0–5)
WBC, UA: NONE SEEN WBC/hpf (ref 0–5)

## 2015-10-22 LAB — APTT: APTT: 30 s (ref 24–37)

## 2015-10-22 LAB — GLUCOSE, CAPILLARY: Glucose-Capillary: 303 mg/dL — ABNORMAL HIGH (ref 65–99)

## 2015-10-22 LAB — PROTIME-INR
INR: 0.98 (ref 0.00–1.49)
Prothrombin Time: 13.2 seconds (ref 11.6–15.2)

## 2015-10-22 LAB — ABO/RH: ABO/RH(D): A POS

## 2015-10-22 LAB — SURGICAL PCR SCREEN
MRSA, PCR: NEGATIVE
Staphylococcus aureus: NEGATIVE

## 2015-10-23 MED ORDER — CEFUROXIME SODIUM 1.5 G IJ SOLR
1.5000 g | INTRAMUSCULAR | Status: AC
  Administered 2015-10-24 (×2): 1.5 g via INTRAVENOUS
  Filled 2015-10-23: qty 1.5

## 2015-10-23 MED ORDER — SODIUM CHLORIDE 0.9 % IV SOLN: INTRAVENOUS | Status: DC

## 2015-10-23 NOTE — Progress Notes (Addendum)
Anesthesia Chart Review:  Pt is a 65 year old male scheduled for L fem-pop bypass graft on 10/24/2015 with Dr. Myra Gianotti.   PCP is Dr. [name]. Cardiologist is Dr. Jacinto Halim who cleared pt for surgery at low risk.   PMH includes:  CAD (NSTEMI, moderate diffuse scattered on cath 2006), PVD, type 1 DM, hyperlipidemia. Current smoker. BMI 23.5  Medications include: ASA, pletal, novolog on an insulin pump, metoprolol, prilosec, pravastatin, ramipril.   Preoperative labs reviewed.  Glucose 305. HgbA1c not obtained at PAT. Notified Stephanie in Dr. Estanislado Spire office of elevated glucose.   EKG 10/11/15: NSR.  Nuclear stress test 10/23/13 (correspondence 08/01/14 in media tab):  1. Stress EKG non-diagnostic for ischemia 2. Normal isotope uptake both at rest and stress. No evidence of ischemia or scar. Normal wall motion and endocardial thickening. EF 63%  Cardiac cath 11/18/04:  1. Nonobstructive coronaries (mild to moderate in LM, LAD, RCA; luminal irregularities in LCx). 2. Preserved left ventricular systolic function with an ejection fraction of 55 to 60% and no wall motion abnormalities.  If blood glucose acceptable DOS, I anticipate pt can proceed as scheduled.   Rica Mast, FNP-BC [facility]/Anesthesiology Phone: [phone] 10/23/2015 9:02 AM

## 2015-10-24 ENCOUNTER — Inpatient Hospital Stay (HOSPITAL_COMMUNITY): Payer: BLUE CROSS/BLUE SHIELD | Admitting: Emergency Medicine

## 2015-10-24 ENCOUNTER — Other Ambulatory Visit: Payer: Self-pay | Admitting: Orthopedic Surgery

## 2015-10-24 ENCOUNTER — Encounter (HOSPITAL_COMMUNITY)
Admission: RE | Disposition: A | Discharge status: Home Health | Payer: Self-pay | Source: Ambulatory Visit | Attending: Surgery

## 2015-10-24 ENCOUNTER — Inpatient Hospital Stay (HOSPITAL_COMMUNITY)
Admission: RE | Admit: 2015-10-24 | Discharge: 2015-10-29 | DRG: 253 | Disposition: A | Discharge status: Home Health | Payer: BLUE CROSS/BLUE SHIELD | Source: Ambulatory Visit | Attending: Surgery | Admitting: Surgery

## 2015-10-24 ENCOUNTER — Encounter (HOSPITAL_COMMUNITY): Payer: Self-pay | Admitting: *Deleted

## 2015-10-24 ENCOUNTER — Inpatient Hospital Stay (HOSPITAL_COMMUNITY): Payer: BLUE CROSS/BLUE SHIELD | Admitting: Certified Registered Nurse Anesthetist

## 2015-10-24 DIAGNOSIS — I739 Peripheral vascular disease, unspecified: Secondary | ICD-10-CM | POA: Diagnosis not present

## 2015-10-24 DIAGNOSIS — E10319 Type 1 diabetes mellitus with unspecified diabetic retinopathy without macular edema: Secondary | ICD-10-CM | POA: Diagnosis present

## 2015-10-24 DIAGNOSIS — L97529 Non-pressure chronic ulcer of other part of left foot with unspecified severity: Secondary | ICD-10-CM | POA: Diagnosis present

## 2015-10-24 DIAGNOSIS — F17210 Nicotine dependence, cigarettes, uncomplicated: Secondary | ICD-10-CM | POA: Diagnosis present

## 2015-10-24 DIAGNOSIS — E10621 Type 1 diabetes mellitus with foot ulcer: Secondary | ICD-10-CM | POA: Diagnosis present

## 2015-10-24 DIAGNOSIS — E1052 Type 1 diabetes mellitus with diabetic peripheral angiopathy with gangrene: Secondary | ICD-10-CM | POA: Diagnosis present

## 2015-10-24 DIAGNOSIS — E785 Hyperlipidemia, unspecified: Secondary | ICD-10-CM | POA: Diagnosis present

## 2015-10-24 DIAGNOSIS — I70262 Atherosclerosis of native arteries of extremities with gangrene, left leg: Secondary | ICD-10-CM

## 2015-10-24 DIAGNOSIS — I2510 Atherosclerotic heart disease of native coronary artery without angina pectoris: Secondary | ICD-10-CM | POA: Diagnosis present

## 2015-10-24 DIAGNOSIS — Z794 Long term (current) use of insulin: Secondary | ICD-10-CM | POA: Diagnosis not present

## 2015-10-24 DIAGNOSIS — I252 Old myocardial infarction: Secondary | ICD-10-CM

## 2015-10-24 DIAGNOSIS — Z9641 Presence of insulin pump (external) (internal): Secondary | ICD-10-CM | POA: Diagnosis present

## 2015-10-24 DIAGNOSIS — Z7982 Long term (current) use of aspirin: Secondary | ICD-10-CM | POA: Diagnosis not present

## 2015-10-24 DIAGNOSIS — M79672 Pain in left foot: Secondary | ICD-10-CM | POA: Diagnosis present

## 2015-10-24 DIAGNOSIS — Z79899 Other long term (current) drug therapy: Secondary | ICD-10-CM | POA: Diagnosis not present

## 2015-10-24 HISTORY — PX: FEMORAL-POPLITEAL BYPASS GRAFT: SHX937

## 2015-10-24 LAB — CREATININE, SERUM: Creatinine, Ser: 1.1 mg/dL (ref 0.61–1.24)

## 2015-10-24 LAB — GLUCOSE, CAPILLARY
GLUCOSE-CAPILLARY: 75 mg/dL (ref 65–99)
GLUCOSE-CAPILLARY: 203 mg/dL — AB (ref 65–99)
GLUCOSE-CAPILLARY: 277 mg/dL — AB (ref 65–99)
GLUCOSE-CAPILLARY: 243 mg/dL — AB (ref 65–99)
Glucose-Capillary: 86 mg/dL (ref 65–99)
Glucose-Capillary: 272 mg/dL — ABNORMAL HIGH (ref 65–99)

## 2015-10-24 LAB — CBC
HEMATOCRIT: 35 % — AB (ref 39.0–52.0)
HEMOGLOBIN: 11.8 g/dL — AB (ref 13.0–17.0)
MCH: 31.3 pg (ref 26.0–34.0)
MCHC: 33.7 g/dL (ref 30.0–36.0)
MCV: 92.8 fL (ref 78.0–100.0)
Platelets: 377 10*3/uL (ref 150–400)
RBC: 3.77 MIL/uL — ABNORMAL LOW (ref 4.22–5.81)
RDW: 13.6 % (ref 11.5–15.5)
WBC: 10.7 10*3/uL — AB (ref 4.0–10.5)

## 2015-10-24 SURGERY — BYPASS GRAFT FEMORAL-POPLITEAL ARTERY
Anesthesia: General | Site: Leg Upper | Laterality: Left

## 2015-10-24 MED ORDER — BACLOFEN 20 MG PO TABS
20.0000 mg | ORAL_TABLET | Freq: Two times a day (BID) | ORAL | Status: DC
  Administered 2015-10-24 – 2015-10-27 (×2): 20 mg via ORAL
  Filled 2015-10-24 (×8): qty 1

## 2015-10-24 MED ORDER — ROCURONIUM BROMIDE 100 MG/10ML IV SOLN
INTRAVENOUS | Status: DC | PRN
  Administered 2015-10-24: 10 mg via INTRAVENOUS
  Administered 2015-10-24 (×2): 20 mg via INTRAVENOUS
  Administered 2015-10-24: 30 mg via INTRAVENOUS

## 2015-10-24 MED ORDER — SODIUM CHLORIDE 0.9 % IV SOLN
INTRAVENOUS | Status: DC
  Administered 2015-10-24 – 2015-10-25 (×2): via INTRAVENOUS

## 2015-10-24 MED ORDER — DEXTROSE 5 % IV SOLN
1.5000 g | INTRAVENOUS | Status: DC
  Filled 2015-10-24: qty 1.5

## 2015-10-24 MED ORDER — PROTAMINE SULFATE 10 MG/ML IV SOLN
INTRAVENOUS | Status: AC
  Filled 2015-10-24: qty 5

## 2015-10-24 MED ORDER — FENTANYL CITRATE (PF) 250 MCG/5ML IJ SOLN
INTRAMUSCULAR | Status: AC
  Filled 2015-10-24: qty 5

## 2015-10-24 MED ORDER — MIDAZOLAM HCL 5 MG/5ML IJ SOLN
INTRAMUSCULAR | Status: DC | PRN
  Administered 2015-10-24: 2 mg via INTRAVENOUS

## 2015-10-24 MED ORDER — ONDANSETRON HCL 4 MG/2ML IJ SOLN: 4.0000 mg | Freq: Four times a day (QID) | INTRAMUSCULAR | Status: DC | PRN

## 2015-10-24 MED ORDER — MAGNESIUM HYDROXIDE 400 MG/5ML PO SUSP: 30.0000 mL | Freq: Every day | ORAL | Status: DC | PRN

## 2015-10-24 MED ORDER — SODIUM CHLORIDE 0.9 % IV SOLN
INTRAVENOUS | Status: DC | PRN
  Administered 2015-10-24: 11:00:00

## 2015-10-24 MED ORDER — OXYCODONE HCL 5 MG/5ML PO SOLN: 5.0000 mg | Freq: Once | ORAL | Status: DC | PRN

## 2015-10-24 MED ORDER — ENOXAPARIN SODIUM 30 MG/0.3ML ~~LOC~~ SOLN: 30.0000 mg | SUBCUTANEOUS | Status: DC

## 2015-10-24 MED ORDER — PROPOFOL 10 MG/ML IV BOLUS
INTRAVENOUS | Status: AC
  Filled 2015-10-24: qty 20

## 2015-10-24 MED ORDER — HYDROMORPHONE HCL 1 MG/ML IJ SOLN
0.2500 mg | INTRAMUSCULAR | Status: DC | PRN
  Administered 2015-10-24 (×4): 0.5 mg via INTRAVENOUS

## 2015-10-24 MED ORDER — POTASSIUM CHLORIDE CRYS ER 20 MEQ PO TBCR: 20.0000 meq | EXTENDED_RELEASE_TABLET | Freq: Every day | ORAL | Status: DC | PRN

## 2015-10-24 MED ORDER — 0.9 % SODIUM CHLORIDE (POUR BTL) OPTIME
TOPICAL | Status: DC | PRN
  Administered 2015-10-24: 2000 mL

## 2015-10-24 MED ORDER — SUCCINYLCHOLINE CHLORIDE 20 MG/ML IJ SOLN
INTRAMUSCULAR | Status: AC
  Filled 2015-10-24: qty 1

## 2015-10-24 MED ORDER — METOPROLOL SUCCINATE ER 25 MG PO TB24
25.0000 mg | ORAL_TABLET | Freq: Every day | ORAL | Status: DC
  Administered 2015-10-24 – 2015-10-29 (×6): 25 mg via ORAL
  Filled 2015-10-24 (×6): qty 1

## 2015-10-24 MED ORDER — ASPIRIN EC 325 MG PO TBEC
325.0000 mg | DELAYED_RELEASE_TABLET | Freq: Every day | ORAL | Status: DC
  Administered 2015-10-25 – 2015-10-29 (×5): 325 mg via ORAL
  Filled 2015-10-24 (×5): qty 1

## 2015-10-24 MED ORDER — MIDAZOLAM HCL 2 MG/2ML IJ SOLN
INTRAMUSCULAR | Status: AC
  Filled 2015-10-24: qty 2

## 2015-10-24 MED ORDER — SUCCINYLCHOLINE CHLORIDE 20 MG/ML IJ SOLN
INTRAMUSCULAR | Status: DC | PRN
  Administered 2015-10-24: 120 mg via INTRAVENOUS

## 2015-10-24 MED ORDER — SODIUM CHLORIDE 0.9 % IJ SOLN
INTRAMUSCULAR | Status: AC
  Filled 2015-10-24: qty 20

## 2015-10-24 MED ORDER — PROTAMINE SULFATE 10 MG/ML IV SOLN
INTRAVENOUS | Status: DC | PRN
  Administered 2015-10-24: 50 mg via INTRAVENOUS

## 2015-10-24 MED ORDER — NITROGLYCERIN 0.2 MG/HR TD PT24
0.2000 mg | MEDICATED_PATCH | Freq: Every day | TRANSDERMAL | Status: DC
  Administered 2015-10-25 – 2015-10-29 (×5): 0.2 mg via TRANSDERMAL
  Filled 2015-10-24 (×6): qty 1

## 2015-10-24 MED ORDER — LACTATED RINGERS IV SOLN
INTRAVENOUS | Status: DC
  Administered 2015-10-24 (×3): via INTRAVENOUS

## 2015-10-24 MED ORDER — DEXAMETHASONE SODIUM PHOSPHATE 10 MG/ML IJ SOLN
INTRAMUSCULAR | Status: DC | PRN
  Administered 2015-10-24: 10 mg via INTRAVENOUS

## 2015-10-24 MED ORDER — RAMIPRIL 10 MG PO CAPS
10.0000 mg | ORAL_CAPSULE | Freq: Every day | ORAL | Status: DC
  Administered 2015-10-24 – 2015-10-29 (×6): 10 mg via ORAL
  Filled 2015-10-24 (×8): qty 1

## 2015-10-24 MED ORDER — DEXAMETHASONE SODIUM PHOSPHATE 10 MG/ML IJ SOLN
INTRAMUSCULAR | Status: AC
  Filled 2015-10-24: qty 1

## 2015-10-24 MED ORDER — MORPHINE SULFATE (PF) 2 MG/ML IV SOLN
2.0000 mg | INTRAVENOUS | Status: DC | PRN
Start: 2015-10-24 — End: 2015-10-29
  Administered 2015-10-26 (×2): 4 mg via INTRAVENOUS
  Administered 2015-10-26: 2 mg via INTRAVENOUS
  Administered 2015-10-26: 4 mg via INTRAVENOUS
  Administered 2015-10-26: 2 mg via INTRAVENOUS
  Administered 2015-10-27: 4 mg via INTRAVENOUS
  Administered 2015-10-27: 2 mg via INTRAVENOUS
  Filled 2015-10-24 (×2): qty 2
  Filled 2015-10-24: qty 1
  Filled 2015-10-24: qty 2
  Filled 2015-10-24 (×2): qty 1
  Filled 2015-10-24: qty 2

## 2015-10-24 MED ORDER — DEXTROSE 5 % IV SOLN
1.5000 g | Freq: Two times a day (BID) | INTRAVENOUS | Status: AC
  Administered 2015-10-24 – 2015-10-25 (×2): 1.5 g via INTRAVENOUS
  Filled 2015-10-24 (×2): qty 1.5

## 2015-10-24 MED ORDER — ONDANSETRON HCL 4 MG/2ML IJ SOLN
INTRAMUSCULAR | Status: DC | PRN
  Administered 2015-10-24: 4 mg via INTRAVENOUS

## 2015-10-24 MED ORDER — SODIUM CHLORIDE 0.9 % IV SOLN: 500.0000 mL | Freq: Once | INTRAVENOUS | Status: DC | PRN

## 2015-10-24 MED ORDER — LIDOCAINE HCL 4 % EX SOLN
CUTANEOUS | Status: DC | PRN
  Administered 2015-10-24: 3 mL via TOPICAL

## 2015-10-24 MED ORDER — PANTOPRAZOLE SODIUM 20 MG PO TBEC
20.0000 mg | DELAYED_RELEASE_TABLET | Freq: Two times a day (BID) | ORAL | Status: DC
  Administered 2015-10-24 – 2015-10-29 (×9): 20 mg via ORAL
  Filled 2015-10-24 (×14): qty 1

## 2015-10-24 MED ORDER — PHENOL 1.4 % MT LIQD
1.0000 | OROMUCOSAL | Status: DC | PRN
  Filled 2015-10-24: qty 177

## 2015-10-24 MED ORDER — FENTANYL CITRATE (PF) 100 MCG/2ML IJ SOLN
INTRAMUSCULAR | Status: DC | PRN
  Administered 2015-10-24: 25 ug via INTRAVENOUS
  Administered 2015-10-24: 100 ug via INTRAVENOUS
  Administered 2015-10-24: 25 ug via INTRAVENOUS
  Administered 2015-10-24 (×4): 50 ug via INTRAVENOUS
  Administered 2015-10-24: 100 ug via INTRAVENOUS
  Administered 2015-10-24: 50 ug via INTRAVENOUS

## 2015-10-24 MED ORDER — PAPAVERINE HCL 30 MG/ML IJ SOLN
INTRAMUSCULAR | Status: AC
  Filled 2015-10-24: qty 2

## 2015-10-24 MED ORDER — ACETAMINOPHEN 650 MG RE SUPP: 325.0000 mg | RECTAL | Status: DC | PRN

## 2015-10-24 MED ORDER — ALUM & MAG HYDROXIDE-SIMETH 200-200-20 MG/5ML PO SUSP
15.0000 mL | ORAL | Status: DC | PRN
  Administered 2015-10-25: 30 mL via ORAL
  Filled 2015-10-24: qty 30

## 2015-10-24 MED ORDER — GLYCOPYRROLATE 0.2 MG/ML IJ SOLN
INTRAMUSCULAR | Status: AC
  Filled 2015-10-24: qty 2

## 2015-10-24 MED ORDER — PHENYLEPHRINE HCL 10 MG/ML IJ SOLN
10.0000 mg | INTRAVENOUS | Status: DC | PRN
  Administered 2015-10-24: 20 ug/min via INTRAVENOUS

## 2015-10-24 MED ORDER — INSULIN ASPART 100 UNIT/ML ~~LOC~~ SOLN
0.0000 [IU] | Freq: Three times a day (TID) | SUBCUTANEOUS | Status: DC
Start: 2015-10-25 — End: 2015-10-25
  Administered 2015-10-24: 8 [IU] via SUBCUTANEOUS

## 2015-10-24 MED ORDER — DOCUSATE SODIUM 100 MG PO CAPS
100.0000 mg | ORAL_CAPSULE | Freq: Every day | ORAL | Status: DC
  Administered 2015-10-25 – 2015-10-29 (×5): 100 mg via ORAL
  Filled 2015-10-24 (×5): qty 1

## 2015-10-24 MED ORDER — EPHEDRINE SULFATE 50 MG/ML IJ SOLN
INTRAMUSCULAR | Status: DC | PRN
  Administered 2015-10-24: 5 mg via INTRAVENOUS

## 2015-10-24 MED ORDER — INSULIN ASPART 100 UNIT/ML ~~LOC~~ SOLN
SUBCUTANEOUS | Status: AC
  Filled 2015-10-24: qty 8

## 2015-10-24 MED ORDER — ACETAMINOPHEN 325 MG PO TABS: 325.0000 mg | ORAL_TABLET | ORAL | Status: DC | PRN

## 2015-10-24 MED ORDER — LIDOCAINE HCL (CARDIAC) 20 MG/ML IV SOLN
INTRAVENOUS | Status: AC
  Filled 2015-10-24: qty 5

## 2015-10-24 MED ORDER — INSULIN REGULAR HUMAN 100 UNIT/ML IJ SOLN
6.0000 [IU] | Freq: Once | INTRAMUSCULAR | Status: DC
  Filled 2015-10-24: qty 0.06

## 2015-10-24 MED ORDER — NEOSTIGMINE METHYLSULFATE 10 MG/10ML IV SOLN
INTRAVENOUS | Status: DC | PRN
  Administered 2015-10-24: 3 mg via INTRAVENOUS

## 2015-10-24 MED ORDER — PROPOFOL 10 MG/ML IV BOLUS
INTRAVENOUS | Status: DC | PRN
  Administered 2015-10-24: 50 mg via INTRAVENOUS
  Administered 2015-10-24: 180 mg via INTRAVENOUS
  Administered 2015-10-24: 20 mg via INTRAVENOUS

## 2015-10-24 MED ORDER — DEXTROSE 50 % IV SOLN
INTRAVENOUS | Status: AC
  Filled 2015-10-24: qty 50

## 2015-10-24 MED ORDER — HEPARIN SODIUM (PORCINE) 1000 UNIT/ML IJ SOLN
INTRAMUSCULAR | Status: AC
  Filled 2015-10-24: qty 1

## 2015-10-24 MED ORDER — STERILE WATER FOR INJECTION IJ SOLN
INTRAMUSCULAR | Status: AC
  Filled 2015-10-24: qty 10

## 2015-10-24 MED ORDER — ONDANSETRON HCL 4 MG/2ML IJ SOLN
INTRAMUSCULAR | Status: AC
  Filled 2015-10-24: qty 2

## 2015-10-24 MED ORDER — METOPROLOL TARTRATE 1 MG/ML IV SOLN: 2.0000 mg | INTRAVENOUS | Status: DC | PRN

## 2015-10-24 MED ORDER — LIDOCAINE HCL (CARDIAC) 20 MG/ML IV SOLN
INTRAVENOUS | Status: DC | PRN
  Administered 2015-10-24: 50 mg via INTRAVENOUS

## 2015-10-24 MED ORDER — ROCURONIUM BROMIDE 50 MG/5ML IV SOLN
INTRAVENOUS | Status: AC
  Filled 2015-10-24: qty 1

## 2015-10-24 MED ORDER — FOLIC ACID 1 MG PO TABS
2.0000 mg | ORAL_TABLET | Freq: Every day | ORAL | Status: DC
  Administered 2015-10-25 – 2015-10-29 (×5): 2 mg via ORAL
  Filled 2015-10-24 (×5): qty 2

## 2015-10-24 MED ORDER — HYDROCODONE-ACETAMINOPHEN 5-325 MG PO TABS
1.0000 | ORAL_TABLET | Freq: Four times a day (QID) | ORAL | Status: DC | PRN
  Administered 2015-10-25 – 2015-10-26 (×2): 2 via ORAL
  Administered 2015-10-26: 1 via ORAL
  Administered 2015-10-26: 2 via ORAL
  Administered 2015-10-26 (×2): 1 via ORAL
  Administered 2015-10-27 (×3): 2 via ORAL
  Administered 2015-10-28: 1 via ORAL
  Administered 2015-10-28: 2 via ORAL
  Filled 2015-10-24 (×4): qty 2
  Filled 2015-10-24 (×3): qty 1
  Filled 2015-10-24 (×3): qty 2
  Filled 2015-10-24: qty 1
  Filled 2015-10-24 (×2): qty 2

## 2015-10-24 MED ORDER — LABETALOL HCL 5 MG/ML IV SOLN: 10.0000 mg | INTRAVENOUS | Status: DC | PRN

## 2015-10-24 MED ORDER — EPHEDRINE SULFATE 50 MG/ML IJ SOLN
INTRAMUSCULAR | Status: AC
  Filled 2015-10-24: qty 1

## 2015-10-24 MED ORDER — INSULIN ASPART 100 UNIT/ML ~~LOC~~ SOLN
SUBCUTANEOUS | Status: DC | PRN
  Administered 2015-10-24: 6 [IU] via SUBCUTANEOUS

## 2015-10-24 MED ORDER — PRAVASTATIN SODIUM 40 MG PO TABS
40.0000 mg | ORAL_TABLET | Freq: Every day | ORAL | Status: DC
  Administered 2015-10-24 – 2015-10-29 (×6): 40 mg via ORAL
  Filled 2015-10-24 (×6): qty 1

## 2015-10-24 MED ORDER — BISACODYL 10 MG RE SUPP: 10.0000 mg | Freq: Every day | RECTAL | Status: DC | PRN

## 2015-10-24 MED ORDER — HEPARIN SODIUM (PORCINE) 1000 UNIT/ML IJ SOLN
INTRAMUSCULAR | Status: DC | PRN
  Administered 2015-10-24: 1000 [IU] via INTRAVENOUS
  Administered 2015-10-24: 8000 [IU] via INTRAVENOUS

## 2015-10-24 MED ORDER — GUAIFENESIN-DM 100-10 MG/5ML PO SYRP: 15.0000 mL | ORAL_SOLUTION | ORAL | Status: DC | PRN

## 2015-10-24 MED ORDER — HYDROMORPHONE HCL 1 MG/ML IJ SOLN
INTRAMUSCULAR | Status: AC
  Filled 2015-10-24: qty 1

## 2015-10-24 MED ORDER — HYDROCODONE-ACETAMINOPHEN 5-325 MG PO TABS: 1.0000 | ORAL_TABLET | Freq: Four times a day (QID) | ORAL | Status: DC | PRN

## 2015-10-24 MED ORDER — CHLORHEXIDINE GLUCONATE CLOTH 2 % EX PADS: 6.0000 | MEDICATED_PAD | Freq: Once | CUTANEOUS | Status: DC

## 2015-10-24 MED ORDER — OXYCODONE HCL 5 MG PO TABS: 5.0000 mg | ORAL_TABLET | Freq: Once | ORAL | Status: DC | PRN

## 2015-10-24 MED ORDER — HEMOSTATIC AGENTS (NO CHARGE) OPTIME
TOPICAL | Status: DC | PRN
  Administered 2015-10-24: 1 via TOPICAL

## 2015-10-24 MED ORDER — HYDRALAZINE HCL 20 MG/ML IJ SOLN: 5.0000 mg | INTRAMUSCULAR | Status: DC | PRN

## 2015-10-24 MED ORDER — VECURONIUM BROMIDE 10 MG IV SOLR
INTRAVENOUS | Status: AC
  Filled 2015-10-24: qty 10

## 2015-10-24 MED ORDER — ESCITALOPRAM OXALATE 10 MG PO TABS
10.0000 mg | ORAL_TABLET | Freq: Every day | ORAL | Status: DC
  Administered 2015-10-24 – 2015-10-29 (×6): 10 mg via ORAL
  Filled 2015-10-24 (×6): qty 1

## 2015-10-24 MED ORDER — HYDROMORPHONE HCL 1 MG/ML IJ SOLN
INTRAMUSCULAR | Status: AC
Start: 2015-10-24 — End: 2015-10-24
  Administered 2015-10-24: 0.5 mg via INTRAVENOUS
  Filled 2015-10-24: qty 1

## 2015-10-24 MED ORDER — GLYCOPYRROLATE 0.2 MG/ML IJ SOLN
INTRAMUSCULAR | Status: DC | PRN
Start: 2015-10-24 — End: 2015-10-24
  Administered 2015-10-24: 0.4 mg via INTRAVENOUS
  Administered 2015-10-24: 0.1 mg via INTRAVENOUS

## 2015-10-24 SURGICAL SUPPLY — 64 items
BANDAGE ELASTIC 4 VELCRO ST LF (GAUZE/BANDAGES/DRESSINGS) IMPLANT
BANDAGE ESMARK 6X9 LF (GAUZE/BANDAGES/DRESSINGS) IMPLANT
BNDG CMPR 9X6 STRL LF SNTH (GAUZE/BANDAGES/DRESSINGS) ×1
BNDG ESMARK 6X9 LF (GAUZE/BANDAGES/DRESSINGS) ×3
CANISTER SUCTION 2500CC (MISCELLANEOUS) ×3 IMPLANT
CANNULA VESSEL 3MM 2 BLNT TIP (CANNULA) ×2 IMPLANT
CLIP TI MEDIUM 24 (CLIP) ×3 IMPLANT
CLIP TI WIDE RED SMALL 24 (CLIP) ×3 IMPLANT
COVER SURGICAL LIGHT HANDLE (MISCELLANEOUS) ×2 IMPLANT
CUFF TOURNIQUET SINGLE 24IN (TOURNIQUET CUFF) IMPLANT
CUFF TOURNIQUET SINGLE 34IN LL (TOURNIQUET CUFF) ×2 IMPLANT
CUFF TOURNIQUET SINGLE 44IN (TOURNIQUET CUFF) IMPLANT
DRAIN CHANNEL 15F RND FF W/TCR (WOUND CARE) IMPLANT
DRAPE PROXIMA HALF (DRAPES) IMPLANT
DRAPE X-RAY CASS 24X20 (DRAPES) IMPLANT
DRSG COVADERM 4X10 (GAUZE/BANDAGES/DRESSINGS) IMPLANT
DRSG COVADERM 4X8 (GAUZE/BANDAGES/DRESSINGS) IMPLANT
ELECT REM PT RETURN 9FT ADLT (ELECTROSURGICAL) ×3
ELECTRODE REM PT RTRN 9FT ADLT (ELECTROSURGICAL) ×1 IMPLANT
EVACUATOR SILICONE 100CC (DRAIN) IMPLANT
GLOVE BIO SURGEON STRL SZ 6.5 (GLOVE) ×3 IMPLANT
GLOVE BIO SURGEONS STRL SZ 6.5 (GLOVE) ×3
GLOVE BIOGEL PI IND STRL 6 (GLOVE) IMPLANT
GLOVE BIOGEL PI IND STRL 6.5 (GLOVE) IMPLANT
GLOVE BIOGEL PI IND STRL 7.5 (GLOVE) ×1 IMPLANT
GLOVE BIOGEL PI INDICATOR 6 (GLOVE) ×2
GLOVE BIOGEL PI INDICATOR 6.5 (GLOVE) ×4
GLOVE BIOGEL PI INDICATOR 7.5 (GLOVE) ×2
GLOVE SS BIOGEL STRL SZ 6.5 (GLOVE) IMPLANT
GLOVE SUPERSENSE BIOGEL SZ 6.5 (GLOVE) ×2
GLOVE SURG SS PI 6.0 STRL IVOR (GLOVE) ×2 IMPLANT
GLOVE SURG SS PI 7.5 STRL IVOR (GLOVE) ×3 IMPLANT
GOWN STRL REUS W/ TWL LRG LVL3 (GOWN DISPOSABLE) ×2 IMPLANT
GOWN STRL REUS W/ TWL XL LVL3 (GOWN DISPOSABLE) ×1 IMPLANT
GOWN STRL REUS W/TWL LRG LVL3 (GOWN DISPOSABLE) ×18
GOWN STRL REUS W/TWL XL LVL3 (GOWN DISPOSABLE) ×3
HEMOSTAT SNOW SURGICEL 2X4 (HEMOSTASIS) IMPLANT
KIT BASIN OR (CUSTOM PROCEDURE TRAY) ×3 IMPLANT
KIT ROOM TURNOVER OR (KITS) ×3 IMPLANT
LIQUID BAND (GAUZE/BANDAGES/DRESSINGS) ×5 IMPLANT
MARKER GRAFT CORONARY BYPASS (MISCELLANEOUS) ×2 IMPLANT
NS IRRIG 1000ML POUR BTL (IV SOLUTION) ×6 IMPLANT
PACK PERIPHERAL VASCULAR (CUSTOM PROCEDURE TRAY) ×3 IMPLANT
PAD ARMBOARD 7.5X6 YLW CONV (MISCELLANEOUS) ×6 IMPLANT
PADDING CAST COTTON 6X4 STRL (CAST SUPPLIES) IMPLANT
SET COLLECT BLD 21X3/4 12 (NEEDLE) IMPLANT
STOPCOCK 4 WAY LG BORE MALE ST (IV SETS) IMPLANT
SUT ETHILON 3 0 PS 1 (SUTURE) IMPLANT
SUT PROLENE 5 0 C 1 24 (SUTURE) ×7 IMPLANT
SUT PROLENE 6 0 BV (SUTURE) ×5 IMPLANT
SUT PROLENE 7 0 BV 1 (SUTURE) ×2 IMPLANT
SUT SILK 2 0 SH (SUTURE) ×3 IMPLANT
SUT SILK 3 0 (SUTURE) ×9
SUT SILK 3-0 18XBRD TIE 12 (SUTURE) IMPLANT
SUT VIC AB 2-0 CT1 27 (SUTURE) ×6
SUT VIC AB 2-0 CT1 TAPERPNT 27 (SUTURE) ×2 IMPLANT
SUT VIC AB 3-0 SH 27 (SUTURE) ×15
SUT VIC AB 3-0 SH 27X BRD (SUTURE) ×2 IMPLANT
SUT VICRYL 4-0 PS2 18IN ABS (SUTURE) ×6 IMPLANT
TAPE UMBILICAL COTTON 1/8X30 (MISCELLANEOUS) IMPLANT
TRAY FOLEY W/METER SILVER 16FR (SET/KITS/TRAYS/PACK) ×3 IMPLANT
TUBING EXTENTION W/L.L. (IV SETS) IMPLANT
UNDERPAD 30X30 INCONTINENT (UNDERPADS AND DIAPERS) ×3 IMPLANT
WATER STERILE IRR 1000ML POUR (IV SOLUTION) ×3 IMPLANT

## 2015-10-24 NOTE — H&P (Signed)
Patient name: [name] MRN: [id number] DOB: [birth year] Sex: male    No chief complaint on file.   HISTORY OF PRESENT ILLNESS: [name] is a 65 y.o. male, who resents with a nonhealing wound of his left foot. This started approximately a week ago. He developed an ulcer on the lateral aspect of his left fifth toe. He then developed redness extending into the left forefoot. He was seen by Dr. Timothy Lasso approximately 5 days ago and started on antibiotics. This was for a 10 day course. He saw Dr. Lajoyce Corners 2 days ago and was referred to our office. The patient previously had seen Dr. Myra Gianotti for mild claudication in 2012. At that point it was recommended that he try to quit smoking and he was started on Pletal. He is on Pletal aspirin and a statin. He currently smokes 1 pack of cigarettes per day. Greater than 3 minutes today spent regarding smoking cessation counseling. He was seen by Dr Nadara Eaton in June 2015 and noted to have a left superficial femoral artery occlusion. Dr Nadara Eaton was unable to pass a wire across this. The patient subsequently had a right superficial femoral artery atherectomy and angioplasty by Dr Nadara Eaton in December 2015. This was noted to be occluded 6 months later. The patient does not really describe claudication symptoms currently. Chronic medical problems include coronary artery disease, diabetes, hyperlipidemia of which are currently stable.  He recently underwent angiograpgy revealing an occlude left SFA.  This could not be crossed.  He is in today for bypass  Past Medical History  Diagnosis Date  . Coronary artery disease   . Peripheral vascular disease (HCC)   . Dyslipidemia   . Cigarette smoker   . Diabetes mellitus     type 1  x 50 yrs  . Diabetic retinopathy [facility])     Past Surgical History  Procedure Laterality Date  . Lower extremity angiogram N/A 01/23/2014    Procedure: LOWER EXTREMITY ANGIOGRAM;  Surgeon: Pamella Pert, MD;  Location: [facility] CATH LAB;   Service: Cardiovascular;  Laterality: N/A;  . Abdominal angiogram N/A 05/29/2014    Procedure: ABDOMINAL ANGIOGRAM;  Surgeon: Pamella Pert, MD;  Location: [facility] CATH LAB;  Service: Cardiovascular;  Laterality: N/A;  . Lower extremity angiogram N/A 07/31/2014    Procedure: LOWER EXTREMITY ANGIOGRAM;  Surgeon: Pamella Pert, MD;  Location: [facility] CATH LAB;  Service: Cardiovascular;  Laterality: N/A;  . Peripheral vascular catheterization Left 10/11/2015    Procedure: Peripheral Vascular Balloon Angioplasty;  Surgeon: Nada Libman, MD;  Location: [facility] INVASIVE CV LAB;  Service: Cardiovascular;  Laterality: Left;  sfa failed unable to cross occluded sfa  . Peripheral vascular catheterization N/A 10/11/2015    Procedure: Abdominal Aortogram;  Surgeon: Nada Libman, MD;  Location: [facility] INVASIVE CV LAB;  Service: Cardiovascular;  Laterality: N/A;  . Lower extremity angiogram Left 10/11/2015    Procedure: Lower Extremity Angiogram;  Surgeon: Nada Libman, MD;  Location: [facility] INVASIVE CV LAB;  Service: Cardiovascular;  Laterality: Left;  . Cardiac catheterization      EF is 55-60% and no wall motion abnormalities (long long time ago)  . Fracture surgery      left arm "many yrs ago"    Social History   Social History  . Marital Status: Widowed    Spouse Name: N/A  . Number of Children: N/A  . Years of Education: N/A   Occupational History  . Not on file.  Social History Main Topics  . Smoking status: Current Every Day Smoker -- 1.00 packs/day for 30 years    Types: Cigarettes  . Smokeless tobacco: Never Used  . Alcohol Use: No     Comment: on occasion wine  . Drug Use: No  . Sexual Activity: Not on file   Other Topics Concern  . Not on file   Social History Narrative    Family History  Problem Relation Age of Onset  . Coronary artery disease Mother     Allergies as of 10/15/2015 - Review Complete 10/11/2015  Allergen Reaction Noted  . Simvastatin Other (See Comments)  04/15/2011    No current facility-administered medications on file prior to encounter.   Current Outpatient Prescriptions on File Prior to Encounter  Medication Sig Dispense Refill  . aspirin EC 325 MG tablet Take 325 mg by mouth daily.    . baclofen (LIORESAL) 20 MG tablet Take 20 mg by mouth 2 (two) times daily.  0  . cilostazol (PLETAL) 100 MG tablet Take 1 tablet (100 mg total) by mouth 2 (two) times daily. (Patient taking differently: Take 100 mg by mouth at bedtime. ) 60 tablet 6  . doxycycline (VIBRAMYCIN) 100 MG capsule Take 100 mg by mouth 2 (two) times daily. 10 day regimen started 2/13  0  . escitalopram (LEXAPRO) 10 MG tablet Take 10 mg by mouth daily.  1  . folic acid (FOLVITE) 800 MCG tablet Take 1,600 mcg by mouth daily.     . insulin aspart (NOVOLOG) 100 UNIT/ML injection Inject into the skin. Patient uses in Insulin Pump.    . metoprolol succinate (TOPROL-XL) 25 MG 24 hr tablet Take 1 tablet (25 mg total) by mouth daily. 30 tablet 1  . pravastatin (PRAVACHOL) 40 MG tablet Take 40 mg by mouth daily.     . ramipril (ALTACE) 10 MG tablet Take 10 mg by mouth daily.      Marland Kitchen HYDROcodone-acetaminophen (NORCO/VICODIN) 5-325 MG per tablet Take 1 tablet by mouth every 6 (six) hours as needed for moderate pain. 30 tablet 0  . Insulin Human (INSULIN PUMP) 100 unit/ml SOLN Inject into the skin daily. Novolog    . nitroGLYCERIN (NITRODUR - DOSED IN MG/24 HR) 0.2 mg/hr patch Place 0.2 mg onto the skin daily.    Marland Kitchen omeprazole (PRILOSEC) 20 MG capsule Take 1 capsule (20 mg total) by mouth 2 (two) times daily before a meal. BID for 7 days than daily (Patient taking differently: Take 20 mg by mouth daily. ) 40 capsule 0      PHYSICAL EXAMINATION:   Vital signs are  Filed Vitals:   10/24/15 0838 10/24/15 0902  BP:  124/58  Pulse:  65  Temp:  97 F (36.1 C)  TempSrc:  Oral  Resp:  20  Height:  (1.803 m)   Weight: 165 lb (74.844 kg)   SpO2:  99%   Body mass index is 23.02  kg/(m^2). General: The patient appears their stated age. HEENT:  No gross abnormalities Pulmonary:  Non labored breathing Abdomen: Soft and non-tender Musculoskeletal: There are no major deformities. Neurologic: No focal weakness or paresthesias are detected, Skin: left toe ulcer Psychiatric: The patient has normal affect. Cardiovascular: There is a regular rate and rhythm without significant murmur appreciated.  Assessment: Atherosclerosis with ulcer Plan: Femoral popliteal bypass for limb salvage, left leg.  Risks and benefits discussed  V. Charlena Cross, M.D. [facility] [place] Office: [phone] Pager:  [phone]

## 2015-10-24 NOTE — Anesthesia Procedure Notes (Signed)
Procedure Name: Intubation Date/Time: 10/24/2015 10:46 AM Performed by: Little Ishikawa L Pre-anesthesia Checklist: Patient identified, Timeout performed, Emergency Drugs available, Suction available and Patient being monitored Patient Re-evaluated:Patient Re-evaluated prior to inductionOxygen Delivery Method: Circle system utilized Preoxygenation: Pre-oxygenation with 100% oxygen Intubation Type: IV induction Ventilation: Mask ventilation without difficulty Laryngoscope Size: Mac and 4 Grade View: Grade I Tube type: Oral Tube size: 7.5 mm Number of attempts: 1 Airway Equipment and Method: Stylet Placement Confirmation: ETT inserted through vocal cords under direct vision,  positive ETCO2 and breath sounds checked- equal and bilateral Secured at: 23 cm Tube secured with: Tape Dental Injury: Teeth and Oropharynx as per pre-operative assessment

## 2015-10-24 NOTE — Progress Notes (Signed)
Patient is on a insulin pump.  He checked his sugar this am, it was 28, he drank 1/2 glass of orange juice @ 7:00.  He rechecked his sugar, 15 min later, it was up to 32.  Insulin pump basal rate last night was .9 We immediately inserted #18 angio in left arm.  Blood sugar check was 75.  Will let anesthesia be aware of results.

## 2015-10-24 NOTE — Progress Notes (Signed)
  Day of Surgery Note    Subjective:  C/o having to go to the bathroom  Filed Vitals:   10/24/15 0902  BP: 124/58  Pulse: 65  Temp: 97 F (36.1 C)  Resp: 20    Incisions:   All are clean and dry without hematoma Extremities:  + doppler signal left AT/PT/peroneal Cardiac:  regular Lungs:  Non labored   Assessment/Plan:  This is a 65 y.o. male who is s/p Left common femoral to below knee popliteal artery bypass graft with ipsilateral translocated non-reversed saphenous vein  -pt doing well in pacu -he has + doppler signals left AT/PT/peroneal -pt uses insulin pump-he did have a BS of 23 pre-op--I have ordered a diabetic consult to help manage his insulin post operatively.  A sliding scale has been ordered. -to 3 south when bed available   Doreatha Massed, PA-C 10/24/2015 3:42 PM

## 2015-10-24 NOTE — Transfer of Care (Signed)
Immediate Anesthesia Transfer of Care Note  Patient: [name]  Procedure(s) Performed: Procedure(s): BYPASS GRAFT FEMORAL below knee POPLITEAL ARTERY with Left Saphenous Vein (Left)  Patient Location: PACU  Anesthesia Type:General  Level of Consciousness: awake, alert  and oriented  Airway & Oxygen Therapy: Patient Spontanous Breathing and Patient connected to nasal cannula oxygen  Post-op Assessment: Report given to RN, Post -op Vital signs reviewed and stable and Patient moving all extremities X 4  Post vital signs: Reviewed and stable  Last Vitals:  Filed Vitals:   10/24/15 0902  BP: 124/58  Pulse: 65  Temp: 36.1 C  Resp: 20    Complications: No apparent anesthesia complications

## 2015-10-24 NOTE — Anesthesia Preprocedure Evaluation (Signed)
Anesthesia Evaluation  Patient identified by MRN, date of birth, ID band Patient awake    Reviewed: Allergy & Precautions, NPO status , Patient's Chart, lab work & pertinent test results  Airway Mallampati: II   Neck ROM: full    Dental   Pulmonary Current Smoker,    breath sounds clear to auscultation       Cardiovascular + CAD, + Past MI and + Peripheral Vascular Disease   Rhythm:regular Rate:Normal  Cleared by Dr Jacinto Halim   Neuro/Psych    GI/Hepatic   Endo/Other  diabetes, Insulin Dependent  Renal/GU      Musculoskeletal   Abdominal   Peds  Hematology   Anesthesia Other Findings   Reproductive/Obstetrics                             Anesthesia Physical Anesthesia Plan  ASA: III  Anesthesia Plan: General   Post-op Pain Management:    Induction: Intravenous  Airway Management Planned: Oral ETT  Additional Equipment:   Intra-op Plan:   Post-operative Plan: Extubation in OR  Informed Consent: I have reviewed the patients History and Physical, chart, labs and discussed the procedure including the risks, benefits and alternatives for the proposed anesthesia with the patient or authorized representative who has indicated his/her understanding and acceptance.     Plan Discussed with: CRNA, Anesthesiologist and Surgeon  Anesthesia Plan Comments:         Anesthesia Quick Evaluation

## 2015-10-24 NOTE — Anesthesia Postprocedure Evaluation (Signed)
Anesthesia Post Note  Patient: [name]  Procedure(s) Performed: Procedure(s) (LRB): BYPASS GRAFT FEMORAL below knee POPLITEAL ARTERY with Left Saphenous Vein (Left)  Patient location during evaluation: PACU Anesthesia Type: General Level of consciousness: awake and alert Pain management: pain level controlled Vital Signs Assessment: post-procedure vital signs reviewed and stable Respiratory status: spontaneous breathing, nonlabored ventilation, respiratory function stable and patient connected to nasal cannula oxygen Cardiovascular status: blood pressure returned to baseline and stable Postop Assessment: no signs of nausea or vomiting Anesthetic complications: no    Last Vitals:  Filed Vitals:   10/24/15 0902  BP: 124/58  Pulse: 65  Temp: 36.1 C  Resp: 20    Last Pain: There were no vitals filed for this visit.               Makya Phillis L

## 2015-10-24 NOTE — Op Note (Signed)
Patient name: [name] MRN: [id number] DOB: [birth year] Sex: male  10/24/2015 Pre-operative Diagnosis: Left foot fifth toe gangrene Post-operative diagnosis:  Same Surgeon:  Durene Cal Assistants:  Doreatha Massed Procedure:   Left common femoral to below knee popliteal artery bypass graft with ipsilateral translocated non-reversed saphenous vein Anesthesia:  Gen. Blood Loss:  See anesthesia record Specimens:  None  Findings:  Vein dilated to approximately 3 mm distally.  There was calcified below knee popliteal artery distal to the anastomosis.  Excellent peroneal and anterior Doppler signal after the procedure which were graft dependent  Indications:  The patient presents with a left fifth toe ulcer.  Angiography shows an occluded superficial femoral and popliteal artery.  He is here today for revascularization.  Procedure:  The patient was identified in the holding area and taken to [facility] OR ROOM 11  The patient was then placed supine on the table. general anesthesia was administered.  The patient was prepped and draped in the usual sterile fashion.  A time out was called and antibiotics were administered.  Ultrasound was used to mark the course of the saphenous vein throughout the leg.  The vein appeared of adequate diameter.  I initially made a longitudinal incision in the groin.  Bovie cautery was used to divide the subcutaneous tissue.  The patient had multiple reactive lymph nodes.  I then isolated the common femoral artery.  The superficial femoral and profunda femoral artery also individually isolated.  The common femoral artery was soft with only a posterior plaque.  There was a good pulse.  Through the same incision I identified the saphenous vein.  It was dissected back to the saphenofemoral junction.  All branches were ligated between silk ties.  Next, the saphenous vein was continued to be harvested throughout the left leg through skip incisions.  Side branches were ligated  between silk ties.  The vein remained at an adequate diameter with the smallest components being around 3 mm.  Finally, a below-knee medial incision was made.  Through this incision I completed the vein harvest.  I also continue the dissection down low the fascia.  The gastrocnemius muscle was reflected posteriorly.  I exposed the below knee popliteal artery and vein.  The artery had a soft area proximally but had moderate calcification distally.  I felt there was an adequate area to sew in the proximal below knee popliteal artery.  Next, the vein was ligated distally with 2-0 silk.  A Cooley J clamp was placed to the saphenofemoral junction and the vein was removed.  The area was closed with 2 layers of 5-0 Prolene.  The vein was prepared on the back table.  It distended nicely.  It was marked for orientation.  Next, a long tunneler was used to tunnel between the groin and below knee incision.  The patient was then fully heparinized.  After the heparin circulated the common femoral profunda femoral and superficial femoral artery were individually occluded.  A #11 blade was used to make an arteriotomy in the common femoral artery.  This was extended longitudinally with Potts scissors.  The vein was then spatulated and placed in a non-reversed fashion.  A running anastomosis was created with 5-0 Prolene.  Once the anastomosis was completed the clamps were released.  A Mills valvulotome was used to lyse the valves.  Once this was completed there was excellent pulsatile flow through the graft.  The vein graft was then brought through the previously created tunnel making  sure to maintain proper orientation.  Next, a stockinette was placed on the upper thigh as well as a tourniquet.  An Esmarch was used to exsanguinate the leg.  The tourniquet was taken to and 50 mm of pressure.  The Esmarch was removed.  I then used a #11 blade to make an arteriotomy in the proximal below knee popliteal artery, just proximal to a  circumferential plaque.  This was extended longitudinally with Potts scissors.  The vein graft was then cut to the appropriate length with the leg extended.  It was then spatulated.  A running anastomosis was created with 6-0 Prolene.  Prior to completion, the tourniquet was let down.  The artery was flushed antegrade and retrograde fashion.  Also flushed the vein graft and completed the anastomosis.  Anastomosis was hemostatic.  Doppler signal was obtained and anterior tibial and peroneal artery which were graft dependent and multiphasic.  50 mg of protamine was then given.  The vein harvest incisions were closed with 2 layers of 3-0 Vicryl.  The groin incision was closed by reapproximating the femoral sheath with 2-0 Vicryl in the subcutaneous tissue with additional layers of 2 and 3-0 Vicryl followed by 4-0 Vicryl on the skin.  The medial below knee incision was closed with 2-0 Vicryl the fascia followed by a layer of 30 and then 4-0 Vicryl with Dermabond.  The patient tolerated the procedure well there were no immediate complications.   Disposition:  To PACU in stable condition.   Juleen China, M.D. [facility] Office: [phone] Pager:  [phone]

## 2015-10-25 ENCOUNTER — Inpatient Hospital Stay (HOSPITAL_COMMUNITY): Payer: BLUE CROSS/BLUE SHIELD | Admitting: Anesthesiology

## 2015-10-25 ENCOUNTER — Inpatient Hospital Stay (HOSPITAL_COMMUNITY)
Admission: RE | Admit: 2015-10-25 | Payer: BLUE CROSS/BLUE SHIELD | Source: Ambulatory Visit | Admitting: Orthopedic Surgery

## 2015-10-25 ENCOUNTER — Ambulatory Visit (HOSPITAL_COMMUNITY): Payer: BLUE CROSS/BLUE SHIELD

## 2015-10-25 ENCOUNTER — Encounter (HOSPITAL_COMMUNITY)
Admission: RE | Disposition: A | Discharge status: Home Health | Payer: Self-pay | Source: Ambulatory Visit | Attending: Surgery

## 2015-10-25 ENCOUNTER — Encounter (HOSPITAL_COMMUNITY): Payer: Self-pay | Admitting: Surgery

## 2015-10-25 DIAGNOSIS — I739 Peripheral vascular disease, unspecified: Secondary | ICD-10-CM

## 2015-10-25 HISTORY — PX: AMPUTATION: SHX166

## 2015-10-25 LAB — CBC
HCT: 33.2 % — ABNORMAL LOW (ref 39.0–52.0)
HEMOGLOBIN: 11.8 g/dL — AB (ref 13.0–17.0)
MCH: 33.1 pg (ref 26.0–34.0)
MCHC: 35.5 g/dL (ref 30.0–36.0)
MCV: 93 fL (ref 78.0–100.0)
Platelets: 387 10*3/uL (ref 150–400)
RBC: 3.57 MIL/uL — AB (ref 4.22–5.81)
RDW: 13.5 % (ref 11.5–15.5)
WBC: 13.3 10*3/uL — AB (ref 4.0–10.5)

## 2015-10-25 LAB — GLUCOSE, CAPILLARY
GLUCOSE-CAPILLARY: 132 mg/dL — AB (ref 65–99)
GLUCOSE-CAPILLARY: 170 mg/dL — AB (ref 65–99)
GLUCOSE-CAPILLARY: 331 mg/dL — AB (ref 65–99)
Glucose-Capillary: 173 mg/dL — ABNORMAL HIGH (ref 65–99)
Glucose-Capillary: 230 mg/dL — ABNORMAL HIGH (ref 65–99)
Glucose-Capillary: 242 mg/dL — ABNORMAL HIGH (ref 65–99)
Glucose-Capillary: 207 mg/dL — ABNORMAL HIGH (ref 65–99)
Glucose-Capillary: 359 mg/dL — ABNORMAL HIGH (ref 65–99)

## 2015-10-25 LAB — BASIC METABOLIC PANEL
Anion gap: 8 (ref 5–15)
BUN: 23 mg/dL — ABNORMAL HIGH (ref 6–20)
CALCIUM: 8.2 mg/dL — AB (ref 8.9–10.3)
CO2: 24 mmol/L (ref 22–32)
CREATININE: 0.97 mg/dL (ref 0.61–1.24)
Chloride: 103 mmol/L (ref 101–111)
GFR calc Af Amer: >60 mL/min (ref >60)
GFR calc non Af Amer: >60 mL/min (ref >60)
GLUCOSE: 253 mg/dL — AB (ref 65–99)
Potassium: 4.4 mmol/L (ref 3.5–5.1)
Sodium: 135 mmol/L (ref 135–145)

## 2015-10-25 LAB — HEMOGLOBIN A1C
HEMOGLOBIN A1C: 9.1 % — AB (ref 4.8–5.6)
Mean Plasma Glucose: 214 mg/dL

## 2015-10-25 SURGERY — AMPUTATION, FOOT, RAY
Anesthesia: Monitor Anesthesia Care | Site: Foot | Laterality: Left

## 2015-10-25 MED ORDER — ACETAMINOPHEN 325 MG PO TABS: 650.0000 mg | ORAL_TABLET | Freq: Four times a day (QID) | ORAL | Status: DC | PRN

## 2015-10-25 MED ORDER — ACETAMINOPHEN 650 MG RE SUPP: 650.0000 mg | Freq: Four times a day (QID) | RECTAL | Status: DC | PRN

## 2015-10-25 MED ORDER — SODIUM CHLORIDE 0.9 % IV SOLN
INTRAVENOUS | Status: DC
  Administered 2015-10-25: 17:00:00 via INTRAVENOUS

## 2015-10-25 MED ORDER — ENOXAPARIN SODIUM 40 MG/0.4ML ~~LOC~~ SOLN
40.0000 mg | SUBCUTANEOUS | Status: DC
  Administered 2015-10-26 – 2015-10-29 (×4): 40 mg via SUBCUTANEOUS
  Filled 2015-10-25 (×4): qty 0.4

## 2015-10-25 MED ORDER — MIDAZOLAM HCL 2 MG/2ML IJ SOLN
INTRAMUSCULAR | Status: AC
  Filled 2015-10-25: qty 2

## 2015-10-25 MED ORDER — LACTATED RINGERS IV SOLN
INTRAVENOUS | Status: DC | PRN
  Administered 2015-10-25: 16:00:00 via INTRAVENOUS

## 2015-10-25 MED ORDER — FENTANYL CITRATE (PF) 250 MCG/5ML IJ SOLN
INTRAMUSCULAR | Status: AC
  Filled 2015-10-25: qty 5

## 2015-10-25 MED ORDER — PROMETHAZINE HCL 25 MG/ML IJ SOLN: 6.2500 mg | INTRAMUSCULAR | Status: DC | PRN

## 2015-10-25 MED ORDER — PROPOFOL 10 MG/ML IV BOLUS
INTRAVENOUS | Status: AC
  Filled 2015-10-25: qty 20

## 2015-10-25 MED ORDER — ROPIVACAINE HCL 5 MG/ML IJ SOLN
INTRAMUSCULAR | Status: DC | PRN
  Administered 2015-10-25: 30 mL via PERINEURAL

## 2015-10-25 MED ORDER — PROPOFOL 10 MG/ML IV BOLUS
INTRAVENOUS | Status: DC | PRN
  Administered 2015-10-25: 10 mg via INTRAVENOUS

## 2015-10-25 MED ORDER — INSULIN PUMP
SUBCUTANEOUS | Status: DC
  Administered 2015-10-25 – 2015-10-26 (×7): via SUBCUTANEOUS
  Filled 2015-10-25: qty 1

## 2015-10-25 MED ORDER — 0.9 % SODIUM CHLORIDE (POUR BTL) OPTIME
TOPICAL | Status: DC | PRN
  Administered 2015-10-25: 1000 mL

## 2015-10-25 MED ORDER — HYDROMORPHONE HCL 1 MG/ML IJ SOLN
1.0000 mg | INTRAMUSCULAR | Status: DC | PRN
  Administered 2015-10-26 (×3): 1 mg via INTRAVENOUS
  Filled 2015-10-25 (×3): qty 1

## 2015-10-25 MED ORDER — METOCLOPRAMIDE HCL 5 MG/ML IJ SOLN: 5.0000 mg | Freq: Three times a day (TID) | INTRAMUSCULAR | Status: DC | PRN

## 2015-10-25 MED ORDER — CEFAZOLIN SODIUM 1-5 GM-% IV SOLN
1.0000 g | Freq: Four times a day (QID) | INTRAVENOUS | Status: AC
  Administered 2015-10-25 – 2015-10-26 (×3): 1 g via INTRAVENOUS
  Filled 2015-10-25 (×3): qty 50

## 2015-10-25 MED ORDER — ONDANSETRON HCL 4 MG/2ML IJ SOLN
INTRAMUSCULAR | Status: DC | PRN
  Administered 2015-10-25: 4 mg via INTRAVENOUS

## 2015-10-25 MED ORDER — ENOXAPARIN SODIUM 30 MG/0.3ML ~~LOC~~ SOLN
30.0000 mg | SUBCUTANEOUS | Status: DC
  Filled 2015-10-25: qty 0.3

## 2015-10-25 MED ORDER — METOCLOPRAMIDE HCL 5 MG PO TABS: 5.0000 mg | ORAL_TABLET | Freq: Three times a day (TID) | ORAL | Status: DC | PRN

## 2015-10-25 MED ORDER — ONDANSETRON HCL 4 MG PO TABS: 4.0000 mg | ORAL_TABLET | Freq: Four times a day (QID) | ORAL | Status: DC | PRN

## 2015-10-25 MED ORDER — ONDANSETRON HCL 4 MG/2ML IJ SOLN: 4.0000 mg | Freq: Four times a day (QID) | INTRAMUSCULAR | Status: DC | PRN

## 2015-10-25 MED ORDER — FENTANYL CITRATE (PF) 100 MCG/2ML IJ SOLN: 25.0000 ug | INTRAMUSCULAR | Status: DC | PRN

## 2015-10-25 MED ORDER — PHENOL 1.4 % MT LIQD: 1.0000 | OROMUCOSAL | Status: DC | PRN

## 2015-10-25 MED ORDER — PROPOFOL 500 MG/50ML IV EMUL
INTRAVENOUS | Status: DC | PRN
  Administered 2015-10-25: 50 ug/kg/min via INTRAVENOUS

## 2015-10-25 MED ORDER — DEXTROSE 5 % IV SOLN
500.0000 mg | Freq: Four times a day (QID) | INTRAVENOUS | Status: DC | PRN
  Filled 2015-10-25: qty 5

## 2015-10-25 MED ORDER — FENTANYL CITRATE (PF) 250 MCG/5ML IJ SOLN
INTRAMUSCULAR | Status: DC | PRN
  Administered 2015-10-25: 50 ug via INTRAVENOUS

## 2015-10-25 MED ORDER — MIDAZOLAM HCL 2 MG/2ML IJ SOLN
INTRAMUSCULAR | Status: DC | PRN
  Administered 2015-10-25 (×2): 1 mg via INTRAVENOUS

## 2015-10-25 MED ORDER — METHOCARBAMOL 500 MG PO TABS: 500.0000 mg | ORAL_TABLET | Freq: Four times a day (QID) | ORAL | Status: DC | PRN

## 2015-10-25 SURGICAL SUPPLY — 36 items
BLADE SAW SGTL MED 73X18.5 STR (BLADE) IMPLANT
BNDG COHESIVE 4X5 TAN STRL (GAUZE/BANDAGES/DRESSINGS) ×3 IMPLANT
BNDG GAUZE ELAST 4 BULKY (GAUZE/BANDAGES/DRESSINGS) ×3 IMPLANT
COVER SURGICAL LIGHT HANDLE (MISCELLANEOUS) ×6 IMPLANT
DRAPE U-SHAPE 47X51 STRL (DRAPES) ×6 IMPLANT
DRSG ADAPTIC 3X8 NADH LF (GAUZE/BANDAGES/DRESSINGS) ×3 IMPLANT
DRSG PAD ABDOMINAL 8X10 ST (GAUZE/BANDAGES/DRESSINGS) ×4 IMPLANT
DURAPREP 26ML APPLICATOR (WOUND CARE) ×3 IMPLANT
ELECT REM PT RETURN 9FT ADLT (ELECTROSURGICAL) ×3
ELECTRODE REM PT RTRN 9FT ADLT (ELECTROSURGICAL) ×1 IMPLANT
GAUZE SPONGE 4X4 12PLY STRL (GAUZE/BANDAGES/DRESSINGS) ×3 IMPLANT
GLOVE BIOGEL PI IND STRL 6.5 (GLOVE) IMPLANT
GLOVE BIOGEL PI IND STRL 9 (GLOVE) ×1 IMPLANT
GLOVE BIOGEL PI INDICATOR 6.5 (GLOVE) ×4
GLOVE BIOGEL PI INDICATOR 9 (GLOVE) ×2
GLOVE SKINSENSE NS SZ6.5 (GLOVE) ×2
GLOVE SKINSENSE STRL SZ6.5 (GLOVE) IMPLANT
GLOVE SURG ORTHO 9.0 STRL STRW (GLOVE) ×3 IMPLANT
GOWN DECONTAM LG NS DISP (GOWN DISPOSABLE) ×2 IMPLANT
GOWN STRL REUS W/ TWL LRG LVL3 (GOWN DISPOSABLE) ×1 IMPLANT
GOWN STRL REUS W/ TWL XL LVL3 (GOWN DISPOSABLE) ×2 IMPLANT
GOWN STRL REUS W/TWL LRG LVL3 (GOWN DISPOSABLE) ×3
GOWN STRL REUS W/TWL XL LVL3 (GOWN DISPOSABLE) ×3
KIT BASIN OR (CUSTOM PROCEDURE TRAY) ×3 IMPLANT
KIT ROOM TURNOVER OR (KITS) ×3 IMPLANT
NS IRRIG 1000ML POUR BTL (IV SOLUTION) ×3 IMPLANT
PACK ORTHO EXTREMITY (CUSTOM PROCEDURE TRAY) ×3 IMPLANT
PAD ARMBOARD 7.5X6 YLW CONV (MISCELLANEOUS) ×6 IMPLANT
SPONGE GAUZE 4X4 12PLY STER LF (GAUZE/BANDAGES/DRESSINGS) ×2 IMPLANT
SPONGE LAP 18X18 X RAY DECT (DISPOSABLE) ×3 IMPLANT
STOCKINETTE IMPERVIOUS LG (DRAPES) IMPLANT
SUT ETHILON 2 0 PSLX (SUTURE) ×6 IMPLANT
TOWEL OR 17X24 6PK STRL BLUE (TOWEL DISPOSABLE) ×3 IMPLANT
TOWEL OR 17X26 10 PK STRL BLUE (TOWEL DISPOSABLE) ×3 IMPLANT
UNDERPAD 30X30 INCONTINENT (UNDERPADS AND DIAPERS) ×3 IMPLANT
WATER STERILE IRR 1000ML POUR (IV SOLUTION) ×3 IMPLANT

## 2015-10-25 NOTE — Op Note (Signed)
10/24/2015 - 10/25/2015  4:10 PM  PATIENT:  [name]    PRE-OPERATIVE DIAGNOSIS:  Gangrene Left 5th Toe  POST-OPERATIVE DIAGNOSIS:  Same  PROCEDURE:  Left Foot 5th Ray Amputation  SURGEON:  Nadara MustardUDA,MARCUS V, MD  PHYSICIAN ASSISTANT:None ANESTHESIA:   General  PREOPERATIVE INDICATIONS:  [name] is a  65 y.o. male with a diagnosis of Gangrene Left 5th Toe who failed conservative measures and elected for surgical management.    The risks benefits and alternatives were discussed with the patient preoperatively including but not limited to the risks of infection, bleeding, nerve injury, cardiopulmonary complications, the need for revision surgery, among others, and the patient was willing to proceed.  OPERATIVE IMPLANTS: none  OPERATIVE FINDINGS: good petechial bleeding  OPERATIVE PROCEDURE: patient was brought to the operating room and underwent a general anesthetic. After adequate levels of anesthesia were obtained patient's left lower extremity was prepped using DuraPrep draped into a sterile field. A timeout was called. A racquet incision was made around the top of the ulcer extending laterally along the fifth metatarsal. Patient's fifth ray was resected through the midshaft of the fifth metatarsal. Electrocautery was used for hemostasis wound was irrigated with normal saline and the incision was closed using 2-0 nylon. A sterile compressive dressing was applied. Patient was extubated taken to the PACU in stable condition. Plan for nonweightbearing on the left foot.

## 2015-10-25 NOTE — Transfer of Care (Signed)
Immediate Anesthesia Transfer of Care Note  Patient: [name]  Procedure(s) Performed: Procedure(s): Left Foot 5th Ray Amputation (Left)  Patient Location: PACU  Anesthesia Type:MAC  Level of Consciousness: awake, alert , oriented, patient cooperative and responds to stimulation  Airway & Oxygen Therapy: Patient Spontanous Breathing and Patient connected to face mask oxygen  Post-op Assessment: Report given to RN, Post -op Vital signs reviewed and stable and Patient moving all extremities X 4  Post vital signs: Reviewed and stable  Last Vitals:  Filed Vitals:   10/25/15 1211 10/25/15 1300  BP:    Pulse:  64  Temp:    Resp: 18 19    Complications: No apparent anesthesia complications

## 2015-10-25 NOTE — Evaluation (Signed)
Physical Therapy Evaluation Patient Details Name: [name] MRN: [id number] DOB: 1951/02/18 Today's Date: 10/25/2015   History of Present Illness  Pt is a 65 y/o M s/p Lt femoral to below knee popliteal artery bypass.  Scheduled for amputation of his Lt foot fifth ray today due to chronic ulceration osteomyelitis. Pt's PMH includes CAD, cigarette smoker, diabetic retinopathy.  Clinical Impression  Patient is s/p above surgery resulting in functional limitations due to the deficits listed below (see PT Problem List). [name] presents w/ decreased ROM and strength in Lt LE, and impaired balance due to surgery listed above.  He currently requires min guard assist for short distance ambulation and sit<>stand transfers.  He will have 24/7 assist available from his girlfriend whom he plans to stay w/ at d/c.  Patient will benefit from skilled PT to increase their independence and safety with mobility to allow discharge to the venue listed below.      Follow Up Recommendations Home health PT;Supervision for mobility/OOB    Equipment Recommendations  3in1 (PT)    Recommendations for Other Services OT consult     Precautions / Restrictions Precautions Precautions: Fall Restrictions Weight Bearing Restrictions: No      Mobility  Bed Mobility Overal bed mobility: Needs Assistance Bed Mobility: Supine to Sit     Supine to sit: Min guard;HOB elevated     General bed mobility comments: Increased time w/ use of bed rail and cues for sequencing.  Transfers Overall transfer level: Needs assistance Equipment used: Rolling walker (2 wheeled) Transfers: Sit to/from Stand Sit to Stand: Min assist         General transfer comment: Cues for proper hand placement and assist to steady RW.  Pt slow to stand w/ dec weight shift to the Lt.  Pt requires verbal and tactile cues to back up to chair w/ RW when preparing to sit.  Ambulation/Gait Ambulation/Gait assistance: Min  guard Ambulation Distance (Feet): 60 Feet Assistive device: Rolling walker (2 wheeled) Gait Pattern/deviations: Step-to pattern;Decreased stride length;Decreased weight shift to left;Antalgic;Trunk flexed   Gait velocity interpretation: Below normal speed for age/gender General Gait Details: Poor follow through due to pain in Lt calf but pt does place Lt foot flat for WB.  He relies heavily on his UE strength and fatigues quickly.    Stairs            Wheelchair Mobility    Modified Rankin (Stroke Patients Only)       Balance Overall balance assessment: Needs assistance Sitting-balance support: Feet supported;No upper extremity supported Sitting balance-Leahy Scale: Good     Standing balance support: Bilateral upper extremity supported;During functional activity Standing balance-Leahy Scale: Poor Standing balance comment: Relies heavily on RW for support                             Pertinent Vitals/Pain Pain Assessment: Faces Faces Pain Scale: Hurts even more Pain Location: Lt LE and chest due to indigestion Pain Descriptors / Indicators: Tightness;Sore;Moaning;Discomfort Pain Intervention(s): Limited activity within patient's tolerance;Monitored during session;Repositioned;Patient requesting pain meds-RN notified    Home Living Family/patient expects to be discharged to:: Private residence Living Arrangements: Spouse/significant other (will be staying at his girlfriend's house at d/c) Available Help at Discharge: Friend(s);Available 24 hours/day Type of Home: House Home Access: Stairs to enter Entrance Stairs-Rails: Right Entrance Stairs-Number of Steps: 2 Home Layout: One level Home Equipment: Walker - 2 wheels (father has two new  RWs that pt can use)      Prior Function Level of Independence: Independent         Comments: Works as a Engineer, 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     Higher education careers adviser        Extremity/Trunk  Assessment   Upper Extremity Assessment: Defer to OT evaluation           Lower Extremity Assessment: LLE deficits/detail   LLE Deficits / Details: limited ROM and strength due to bypass     Communication   Communication: HOH  Cognition Arousal/Alertness: Awake/alert Behavior During Therapy: Flat affect Overall Cognitive Status: Within Functional Limits for tasks assessed                      General Comments      Exercises General Exercises - Lower Extremity Ankle Circles/Pumps: AROM;Both;Supine;Seated;20 reps Quad Sets: Strengthening;Both;10 reps;Seated Long Arc Quad: AROM;10 reps;Seated;Left      Assessment/Plan    PT Assessment Patient needs continued PT services  PT Diagnosis Difficulty walking;Abnormality of gait;Acute pain   PT Problem List Decreased strength;Decreased range of motion;Decreased activity tolerance;Decreased balance;Decreased mobility;Decreased knowledge of use of DME;Decreased safety awareness;Impaired sensation;Pain  PT Treatment Interventions DME instruction;Gait training;Stair training;Functional mobility training;Therapeutic activities;Therapeutic exercise;Balance training;Patient/family education;Modalities   PT Goals (Current goals can be found in the Care Plan section) Acute Rehab PT Goals 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 PT Goal Formulation: With patient/family Time For Goal Achievement: 11/06/15 Potential to Achieve Goals: Good    Frequency Min 3X/week   Barriers to discharge Inaccessible home environment steps to enter home    Co-evaluation               End of Session Equipment Utilized During Treatment: Gait belt Activity Tolerance: Patient limited by pain;Patient limited by fatigue Patient left: in chair;with call bell/phone within reach;with chair alarm set;with family/visitor present Nurse Communication: Mobility status;Other (comment) (requests pain med for headache; meds for  indigestion)         Time: 8119-1478 PT Time Calculation (min) (ACUTE ONLY): 29 min   Charges:   PT Evaluation $PT Eval Moderate Complexity: 1 Procedure PT Treatments $Gait Training: 8-22 mins   PT G Codes:       Encarnacion Chu PT, DPT  Pager: [phone] Phone: [phone] 10/25/2015, 1:28 PM

## 2015-10-25 NOTE — Progress Notes (Signed)
VASCULAR LAB PRELIMINARY  PRELIMINARY  PRELIMINARY  PRELIMINARY   VASCULAR LAB PRELIMINARY  ARTERIAL  ABI completed:    RIGHT    LEFT    PRESSURE WAVEFORM  PRESSURE WAVEFORM  BRACHIAL 109 Triphasic BRACHIAL 117 Triphasic  DP 254 noncompressible  DP 94 Monophasic  PT 242 noncompressible PT 34 Monophasic    RIGHT LEFT  ABI Unable to obtain 0.80   Unable to obtain right ABI' due to noncompensable calcified arteries. Left ABI is suggestive mild arterial disease at rest -does nor correlate with the waveforms that indicate monophasic waveform. That may indicate calcification in left leg. Previous exam on 10/09/2014.   Jenetta Logesami Lindy Pennisi, RVT 10/25/2015, 3:05 PM

## 2015-10-25 NOTE — H&P (Signed)
  Patient is status post revascularization to the left lower extremity. 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 Risk and benefits were discussed including risk of the wound not healing. Patient states he understands wishs to proceed at this time.

## 2015-10-25 NOTE — Anesthesia Preprocedure Evaluation (Addendum)
Anesthesia Evaluation  Patient identified by MRN, date of birth, ID band Patient awake    Reviewed: Allergy & Precautions, H&P , NPO status , Patient's Chart, lab work & pertinent test results  History of Anesthesia Complications Negative for: history of anesthetic complications  Airway Mallampati: II  TM Distance: >3 FB Neck ROM: full    Dental no notable dental hx.    Pulmonary neg pulmonary ROS, Current Smoker,    Pulmonary exam normal breath sounds clear to auscultation       Cardiovascular + CAD, + Past MI and + Peripheral Vascular Disease  negative cardio ROS Normal cardiovascular exam Rhythm:regular Rate:Normal  Cleared by Dr Jacinto HalimGanji   Neuro/Psych negative neurological ROS     GI/Hepatic negative GI ROS, Neg liver ROS,   Endo/Other  negative endocrine ROSdiabetes, Insulin Dependent  Renal/GU negative Renal ROS     Musculoskeletal   Abdominal   Peds  Hematology negative hematology ROS (+)   Anesthesia Other Findings   Reproductive/Obstetrics negative OB ROS                            Anesthesia Physical  Anesthesia Plan  ASA: III  Anesthesia Plan: MAC and Regional   Post-op Pain Management: MAC Combined w/ Regional for Post-op pain   Induction: Intravenous  Airway Management Planned: Simple Face Mask  Additional Equipment:   Intra-op Plan:   Post-operative Plan: Extubation in OR  Informed Consent: I have reviewed the patients History and Physical, chart, labs and discussed the procedure including the risks, benefits and alternatives for the proposed anesthesia with the patient or authorized representative who has indicated his/her understanding and acceptance.   Dental Advisory Given  Plan Discussed with: Anesthesiologist, CRNA and Surgeon  Anesthesia Plan Comments:        Anesthesia Quick Evaluation

## 2015-10-25 NOTE — Progress Notes (Addendum)
  Progress Note    10/25/2015 7:35 AM 1 Day Post-Op  Subjective:  C/o sore throat  Afebrile HR 70's-80's NSR 120's-140's systolic 97% RA  Filed Vitals:   10/25/15 0045 10/25/15 0431  BP: 126/59 123/49  Pulse: 75 81  Temp: 98.2 F (36.8 C) 97.7 F (36.5 C)  Resp: 13 15    Physical Exam: Cardiac:  regular Lungs:  Non labored Incisions:  All incisions are c/d/i Extremities:  + doppler signal left AT/peroneal   CBC    Component Value Date/Time   WBC 10.7* 10/24/2015 2300   RBC 3.77* 10/24/2015 2300   HGB 11.8* 10/24/2015 2300   HCT 35.0* 10/24/2015 2300   PLT 377 10/24/2015 2300   MCV 92.8 10/24/2015 2300   MCH 31.3 10/24/2015 2300   MCHC 33.7 10/24/2015 2300   RDW 13.6 10/24/2015 2300    BMET    Component Value Date/Time   NA 136 10/22/2015 1308   K 4.9 10/22/2015 1308   CL 104 10/22/2015 1308   CO2 26 10/22/2015 1308   GLUCOSE 305* 10/22/2015 1308   BUN 15 10/22/2015 1308   CREATININE 1.10 10/24/2015 2300   CALCIUM 9.0 10/22/2015 1308   GFRNONAA >60 10/24/2015 2300   GFRAA >60 10/24/2015 2300    INR    Component Value Date/Time   INR 0.98 10/22/2015 1308     Intake/Output Summary (Last 24 hours) at 10/25/15 0735 Last data filed at 10/25/15 0600  Gross per 24 hour  Intake 4038.33 ml  Output   1550 ml  Net 2488.33 ml     Assessment:  65 y.o. male is s/p:  Left common femoral to below knee popliteal artery bypass graft with ipsilateral translocated non-reversed saphenous vein  1 Day Post-Op  Plan: -pt with +doppler signal left AT/peroneal -DVT prophylaxis:  Lovenox was scheduled to start this afternoon, but given he is going to the OR with Dr. Lajoyce Cornersuda, will start tomorrow morning. -increase mobilization   Doreatha MassedSamantha Rhyne, PA-C [facility] [phone] 10/25/2015 7:35 AM    I agree with the above.  Patient is status post left femoral to below knee popliteal artery bypass graft with ipsilateral non-reversed saphenous  vein.  He has an excellent Doppler signal in his anterior tibial artery.  His incisions are healing appropriately.  He is scheduled for toe amputation later today by Dr. Gershon Musseluda  [facility]

## 2015-10-25 NOTE — Progress Notes (Signed)
Orthopedic Tech Progress Note Patient Details:  [name] BostonStephen L [name] Jul 01, 1951 [id number]  Ortho Devices Type of Ortho Device: Postop shoe/boot Ortho Device/Splint Interventions: Application   Saul FordyceJennifer C Livingston Denner 10/25/2015, 6:19 PM

## 2015-10-25 NOTE — Progress Notes (Signed)
preop report given to shortstay RN at this time.

## 2015-10-25 NOTE — Progress Notes (Signed)
Inpatient Diabetes Program Recommendations  AACE/ADA: New Consensus Statement on Inpatient Glycemic Control (2015)  Target Ranges:  Prepandial:   less than 140 mg/dL      Peak postprandial:   less than 180 mg/dL (1-2 hours)      Critically ill patients:  140 - 180 mg/dL   Review of Glycemic Control: Results for [name], Isabel L (MRN [id number]) as of 10/25/2015 09:09  Ref. Range 10/24/2015 19:22 10/24/2015 20:35 10/24/2015 21:38 10/25/2015 00:47 10/25/2015 08:04  Glucose-Capillary Latest Ref Range: 65-99 mg/dL 409277 (H) 811272 (H) 914243 (H) 173 (H) 207 (H)   Diabetes history: Type 1 diabetes for 50 years- See's Dr. Timothy Lassousso Outpatient Diabetes medications: Insulin pump Assessed patient's insulin pump settings: Total 24 hour basal=24.73 units/hr 12a-1.25 units/hr 5a-  1.3 unit/hr 8:30 a-0.9 units/hr 4p-0.725 units/hr 7p- 1.0 units/hr Patients states he covers CHO based on "what he thinks he needs" which is typically 4 units/meal.  He states that he does not correct blood sugars at night due to leg cramps from decrease potassium.  If procedure is scheduled for less than 2 hours, patient may continue insulin pump basal rates.  However will need close monitoring during surgery.  Patient states he has 55 units of insulin left in insulin pump.  He will call his friend to bring more supplies.  At this time patient is very hungry and complaining about being NPO.  Discussed with RN.      Thanks, Beryl MeagerJenny Kendrick Remigio, RN, BC-ADM Inpatient Diabetes Coordinator Pager [phone] (8a-5p)

## 2015-10-25 NOTE — Anesthesia Procedure Notes (Addendum)
Anesthesia Regional Block:  Popliteal block  Pre-Anesthetic Checklist: ,, timeout performed, Correct Patient, Correct Site, Correct Laterality, Correct Procedure, Correct Position, site marked, Risks and benefits discussed,  Surgical consent,  Pre-op evaluation,  At surgeon's request and post-op pain management  Laterality: Left  Prep: chloraprep       Needles:  Injection technique: Single-shot  Needle Type: Echogenic Stimulator Needle     Needle Length: 9cm 9 cm Needle Gauge: 21 and 21 G    Additional Needles:  Procedures: ultrasound guided (picture in chart) Popliteal block Narrative:  Injection made incrementally with aspirations every 5 mL.  Performed by: Personally  Anesthesiologist: JUDD, BENJAMIN  Additional Notes: Risks, benefits and alternative to block explained extensively.  Patient tolerated procedure well, without complications.   Procedure Name: MAC Date/Time: 10/25/2015 3:40 PM Performed by: Virgel GessHOLTZMAN, Georgiana Spillane LEFFEW Pre-anesthesia Checklist: Patient identified, Emergency Drugs available, Suction available, Timeout performed and Patient being monitored Patient Re-evaluated:Patient Re-evaluated prior to inductionOxygen Delivery Method: Simple face mask Placement Confirmation: positive ETCO2

## 2015-10-25 NOTE — Progress Notes (Signed)
Pt would like to use his own insulin pump to manage blood sugars tonight.  Spoke with Dr. Edilia Boickson about pt using insulin pump tonight and pt will meet with Diabetes coordinator tomorrow.  Will continue to monitor pts blood sugar throughout the night.

## 2015-10-26 LAB — GLUCOSE, CAPILLARY
GLUCOSE-CAPILLARY: 121 mg/dL — AB (ref 65–99)
GLUCOSE-CAPILLARY: 121 mg/dL — AB (ref 65–99)
Glucose-Capillary: 242 mg/dL — ABNORMAL HIGH (ref 65–99)
Glucose-Capillary: 247 mg/dL — ABNORMAL HIGH (ref 65–99)

## 2015-10-26 MED ORDER — INSULIN PUMP
Freq: Three times a day (TID) | SUBCUTANEOUS | Status: DC
  Administered 2015-10-26: 1 via SUBCUTANEOUS
  Administered 2015-10-27: 180 via SUBCUTANEOUS
  Filled 2015-10-26: qty 1

## 2015-10-26 NOTE — Progress Notes (Signed)
    Subjective  - POD #2, s/p Left fem-BK pop BPG w/ vein POD#1, s/p left 5th toe amp by Dr. Lajoyce Cornersuda  Some pain, but tolerable Ambulated prior to toe amp yesterday   Physical Exam:  Brisk doppler Left AT Dressing dry Incisions look good       Assessment/Plan:  POD #2/1  GU:  D/c foley PT / OT;  Ambulate, NWB L 5th toe Transfer to floor Diabetes team helping with management of insulin pump  [name], [name] 10/26/2015 8:18 AM --  Filed Vitals:   10/26/15 0451 10/26/15 0800  BP: 116/63 122/60  Pulse: 66   Temp: 98.2 F (36.8 C)   Resp: 13     Intake/Output Summary (Last 24 hours) at 10/26/15 0818 Last data filed at 10/26/15 0700  Gross per 24 hour  Intake 1518.33 ml  Output   1740 ml  Net -221.67 ml     Laboratory CBC    Component Value Date/Time   WBC 13.3* 10/25/2015 0626   HGB 11.8* 10/25/2015 0626   HCT 33.2* 10/25/2015 0626   PLT 387 10/25/2015 0626    BMET    Component Value Date/Time   NA 135 10/25/2015 0626   K 4.4 10/25/2015 0626   CL 103 10/25/2015 0626   CO2 24 10/25/2015 0626   GLUCOSE 253* 10/25/2015 0626   BUN 23* 10/25/2015 0626   CREATININE 0.97 10/25/2015 0626   CALCIUM 8.2* 10/25/2015 0626   GFRNONAA >60 10/25/2015 0626   GFRAA >60 10/25/2015 0626    COAG Lab Results  Component Value Date   INR 0.98 10/22/2015   INR 0.95 07/31/2014   INR 1.01 05/29/2014   No results found for: PTT  Antibiotics Anti-infectives    Start     Dose/Rate Route Frequency Ordered Stop   10/25/15 1800  ceFAZolin (ANCEF) IVPB 1 g/50 mL premix     1 g 100 mL/hr over 30 Minutes Intravenous Every 6 hours 10/25/15 1631 10/26/15 0702   10/24/15 2130  cefUROXime (ZINACEF) 1.5 g in dextrose 5 % 50 mL IVPB     1.5 g 100 mL/hr over 30 Minutes Intravenous Every 12 hours 10/24/15 1956 10/25/15 0845   10/24/15 1500  cefUROXime (ZINACEF) 1.5 g in dextrose 5 % 50 mL IVPB  Status:  Discontinued     1.5 g 100 mL/hr over 30 Minutes Intravenous To ShortStay  Surgical 10/24/15 1451 10/24/15 1541   10/24/15 0600  cefUROXime (ZINACEF) 1.5 g in dextrose 5 % 50 mL IVPB     1.5 g 100 mL/hr over 30 Minutes Intravenous To ShortStay Surgical 10/23/15 1248 10/24/15 1454       V. Charlena CrossWells [name] IV, M.D. [facility] [place] Office: [phone] Pager:  [phone]

## 2015-10-26 NOTE — Progress Notes (Signed)
Inpatient Diabetes Program Recommendations  AACE/ADA: New Consensus Statement on Inpatient Glycemic Control (2015)  Target Ranges:  Prepandial:   less than 140 mg/dL      Peak postprandial:   less than 180 mg/dL (1-2 hours)      Critically ill patients:  140 - 180 mg/dL  Results for [name], [name] (MRN [id number]) as of 10/26/2015 10:30  Ref. Range 10/25/2015 11:11 10/25/2015 16:14 10/25/2015 20:37 10/25/2015 21:37  Glucose-Capillary Latest Ref Range: 65-99 mg/dL 147132 (H) 829170 (H) 562331 (H) 359 (H)   Review of Glycemic Control  Diabetes history: DM Outpatient Diabetes medications: Insulin pump Current orders for Inpatient glycemic control: Insulin pump, CBGs ACHS&2am  Inpatient Diabetes Program Recommendations: Insulin Pump: NURSING: please check glucose with hospital glucometer ACHS&2am so that glucose is uploaded into chart and also per insulin pump contract the hospital glucose result is the value patient will use for doing a correction on his insulin pump. Also, please be sure to complete the Insulin Pump Flowsheet every shift.  NOTE: No glucose values have been obtained with the hospital glucometer since 21:37 on 10/25/15 and glucose was up to 359 mg/dl at that time. Patient was just transferred from 3S to 2W. Spoke with Domingo CockingEster, RN (RN on 2W receiving patient) about insulin pump order set and policy. Asked that patient's CBGs be checked ACHS&2am with the hospital glucometer and that the insulin pump flowsheet be completed at least once a shift. Asked that she check with patient to ensure his friend brought him his home supply of insulin for his insulin pump since he only had 55 units of insulin in his insulin pump yesterday.   Thanks, Orlando PennerMarie Temisha Murley, RN, MSN, CDE Diabetes Coordinator Inpatient Diabetes Program [phone] (Team Pager from 8am to 5pm) [phone] ([facility] office) [phone] [facility] office) [phone] [facility] office)

## 2015-10-26 NOTE — Progress Notes (Signed)
Pt TX to 2W-10, VSS, called report. Offered to notify family of TX; Pt said he would.

## 2015-10-27 LAB — GLUCOSE, CAPILLARY
GLUCOSE-CAPILLARY: 107 mg/dL — AB (ref 65–99)
GLUCOSE-CAPILLARY: 177 mg/dL — AB (ref 65–99)
GLUCOSE-CAPILLARY: 184 mg/dL — AB (ref 65–99)
Glucose-Capillary: 216 mg/dL — ABNORMAL HIGH (ref 65–99)
Glucose-Capillary: 123 mg/dL — ABNORMAL HIGH (ref 65–99)

## 2015-10-27 NOTE — Progress Notes (Signed)
Patient lying in bed, pain meds given. Call light within reach 

## 2015-10-27 NOTE — Progress Notes (Signed)
    Subjective  - POD #3/2  Wants to get OOB Pain controlled   Physical Exam:  Incisions clean and dry Dressing to foot intact       Assessment/Plan:  POD #3/2  Activity per Dr./ DUda Lovenox for DVT Insulin pump for DM  Xylah Early, Wells 10/27/2015 10:46 AM --  Filed Vitals:   10/26/15 2107 10/27/15 0526  BP: 125/58 118/43  Pulse: 74 68  Temp: 98.3 F (36.8 C) 98.6 F (37 C)  Resp: 16 16    Intake/Output Summary (Last 24 hours) at 10/27/15 1046 Last data filed at 10/27/15 0539  Gross per 24 hour  Intake    480 ml  Output    250 ml  Net    230 ml     Laboratory CBC    Component Value Date/Time   WBC 13.3* 10/25/2015 0626   HGB 11.8* 10/25/2015 0626   HCT 33.2* 10/25/2015 0626   PLT 387 10/25/2015 0626    BMET    Component Value Date/Time   NA 135 10/25/2015 0626   K 4.4 10/25/2015 0626   CL 103 10/25/2015 0626   CO2 24 10/25/2015 0626   GLUCOSE 253* 10/25/2015 0626   BUN 23* 10/25/2015 0626   CREATININE 0.97 10/25/2015 0626   CALCIUM 8.2* 10/25/2015 0626   GFRNONAA >60 10/25/2015 0626   GFRAA >60 10/25/2015 0626    COAG Lab Results  Component Value Date   INR 0.98 10/22/2015   INR 0.95 07/31/2014   INR 1.01 05/29/2014   No results found for: PTT  Antibiotics Anti-infectives    Start     Dose/Rate Route Frequency Ordered Stop   10/25/15 1800  ceFAZolin (ANCEF) IVPB 1 g/50 mL premix     1 g 100 mL/hr over 30 Minutes Intravenous Every 6 hours 10/25/15 1631 10/26/15 0702   10/24/15 2130  cefUROXime (ZINACEF) 1.5 g in dextrose 5 % 50 mL IVPB     1.5 g 100 mL/hr over 30 Minutes Intravenous Every 12 hours 10/24/15 1956 10/25/15 0845   10/24/15 1500  cefUROXime (ZINACEF) 1.5 g in dextrose 5 % 50 mL IVPB  Status:  Discontinued     1.5 g 100 mL/hr over 30 Minutes Intravenous To ShortStay Surgical 10/24/15 1451 10/24/15 1541   10/24/15 0600  cefUROXime (ZINACEF) 1.5 g in dextrose 5 % 50 mL IVPB     1.5 g 100 mL/hr over 30 Minutes  Intravenous To ShortStay Surgical 10/23/15 1248 10/24/15 1454       V. Charlena CrossWells Shyra Emile IV, M.D. [facility] [place] Office: [phone] Pager:  [phone]

## 2015-10-27 NOTE — Evaluation (Addendum)
Occupational Therapy Evaluation Patient Details Name: [name] MRN: [id number] DOB: 04/07/1951 Today's Date: 10/27/2015    History of Present Illness Pt is a 65 y.o. M s/p Lt femoral to below knee popliteal artery bypass.  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 Pt's PMH includes CAD, cigarette smoker, diabetic retinopathy, peripheral vascular disesase, DM, and dyslipidemia.   Clinical Impression   Pt s/p above. Pt independent with ADLs, PTA. Feel pt will benefit from acute OT to increase independence prior to d/c.    Follow Up Recommendations  No OT follow up;Supervision/Assistance - 24 hour    Equipment Recommendations  3 in 1 bedside comode (if pt does not have one)    Recommendations for Other Services       Precautions / Restrictions Precautions Precautions: Fall Restrictions Weight Bearing Restrictions: Yes LLE Weight Bearing: Non weight bearing Other Position/Activity Restrictions: order for post op shoe in chart      Mobility Bed Mobility Overal bed mobility: Needs Assistance Bed Mobility: Supine to Sit;Sit to Supine     Supine to sit: Supervision Sit to supine: Supervision      Transfers Overall transfer level: Needs assistance Equipment used: Rolling walker (2 wheeled) Transfers: Sit to/from Stand Sit to Stand: Min guard              Balance      Used RW for ambulation-Min guard. Balance not formally assessed.                                      ADL Overall ADL's : Needs assistance/impaired Eating/Feeding: Independent;Bed level                   Lower Body Dressing: Minimal assistance;Sit to/from stand   Toilet Transfer: Min guard;Ambulation;RW (sit to stand from bed)           Functional mobility during ADLs: Min guard;Rolling walker General ADL Comments: Educated on safety such as sitting for LB ADLs.  cues for NWB of LLE in session. Discussed 3 in 1.      Vision     Perception     Praxis      Pertinent Vitals/Pain Pain Assessment: 0-10 Pain Score:  (4-5) Pain Location: Lt foot Pain Descriptors / Indicators: Burning Pain Intervention(s): Monitored during session     Hand Dominance     Extremity/Trunk Assessment Upper Extremity Assessment Upper Extremity Assessment: Overall WFL for tasks assessed   Lower Extremity Assessment Lower Extremity Assessment: Defer to PT evaluation       Communication     Cognition Arousal/Alertness: Awake/alert Behavior During Therapy: Flat affect Overall Cognitive Status: No family/caregiver present to determine baseline-pt reports he had been up to chair alone                      General Comments       Exercises       Shoulder Instructions      Home Living Family/patient expects to be discharged to:: Private residence Living Arrangements:  (unsure if it is friend or significant other he is going to stay with upon d/c) Available Help at Discharge: Friend(s) (most of the time) Type of Home: House Home Access: Stairs to enter [employer] of Steps: 2 Entrance Stairs-Rails: Right Home Layout: One level     Bathroom Shower/Tub: Tub/shower unit;Walk-in  shower   Bathroom Toilet:  (thinks it's standard)     Home Equipment: Walker - 2 wheels          Prior Functioning/Environment Level of Independence: Independent        Comments: 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    OT Diagnosis: Acute pain   OT Problem List: Pain;Decreased knowledge of use of DME or AE;Decreased knowledge of precautions;Impaired balance (sitting and/or standing);Decreased range of motion;Decreased safety awareness   OT Treatment/Interventions: Self-care/ADL training;DME and/or AE instruction;Therapeutic activities;Patient/family education;Balance training;Cognitive remediation/compensation    OT Goals(Current goals can be found in the care plan  section) Acute Rehab OT Goals Patient Stated Goal: go home OT Goal Formulation: With patient Time For Goal Achievement: 11/03/15 Potential to Achieve Goals: Good ADL Goals Pt Will Perform Lower Body Dressing: with set-up;with supervision;sit to/from stand Pt Will Transfer to Toilet: with supervision;ambulating;with set-up (3 in 1 over toilet or regular commode) Pt Will Perform Toileting - Clothing Manipulation and hygiene: with supervision;sit to/from stand Pt Will Perform Tub/Shower Transfer: Shower transfer;with supervision;with set-up;shower seat;rolling walker  OT Frequency: Min 2X/week   Barriers to D/C:            Co-evaluation              End of Session Equipment Utilized During Treatment: Gait belt;Rolling walker Nurse Communication: Weight bearing status;Other (comment) (asked about post op shoe)  Activity Tolerance: Patient tolerated treatment well Patient left: in bed;with call bell/phone within reach;with bed alarm set   Time: [id number] (tech came in and took blood sugar and OT also stepped out of room briefly to talk to nurse) OT Time Calculation (min): 23 min Charges:  OT General Charges $OT Visit: 1 Procedure OT Evaluation $OT Eval Moderate Complexity: 1 Procedure G-CodesEarlie Raveling:     Joslyne Marshburn L OTR/L [phone] 10/27/2015, 1:24 PM

## 2015-10-28 ENCOUNTER — Encounter (HOSPITAL_COMMUNITY): Payer: Self-pay | Admitting: Orthopedic Surgery

## 2015-10-28 LAB — GLUCOSE, CAPILLARY
GLUCOSE-CAPILLARY: 96 mg/dL (ref 65–99)
Glucose-Capillary: 92 mg/dL (ref 65–99)
Glucose-Capillary: 209 mg/dL — ABNORMAL HIGH (ref 65–99)

## 2015-10-28 MED ORDER — BACLOFEN 20 MG PO TABS
20.0000 mg | ORAL_TABLET | Freq: Two times a day (BID) | ORAL | Status: DC | PRN
  Filled 2015-10-28: qty 1

## 2015-10-28 NOTE — Progress Notes (Signed)
Occupational Therapy Treatment Patient Details Name: [name] MRN: [id number] DOB: 1951/03/16 Today's Date: 10/28/2015    History of present illness Pt is a 65 y.o. M s/p Lt femoral to below knee popliteal artery bypass.  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 Pt's PMH includes CAD, cigarette smoker, diabetic retinopathy, peripheral vascular disesase, DM, and dyslipidemia.   OT comments  Pt.  completed grooming tasks in sit/stand at sink.  requires cues for maintaining NWB on LLE.  Educated on sitting for task completion when able.    Follow Up Recommendations  No OT follow up;Supervision/Assistance - 24 hour    Equipment Recommendations  3 in 1 bedside comode    Recommendations for Other Services      Precautions / Restrictions Precautions Precautions: Fall Restrictions LLE Weight Bearing: Non weight bearing Other Position/Activity Restrictions: order for post op shoe in chart       Mobility Bed Mobility Overal bed mobility: Modified Independent Bed Mobility: Supine to Sit              Transfers Overall transfer level: Needs assistance Equipment used: Rolling walker (2 wheeled) Transfers: Sit to/from [employer] Sit to Stand: Min guard Stand pivot transfers: Min guard       General transfer comment: cues for maintaining wbs    Balance                                   ADL Overall ADL's : Needs assistance/impaired     Grooming: Oral care;Wash/dry hands;Min guard;Standing Grooming Details (indicate cue type and reason): max verbal cues for maintaining wbs of LLE.           Upper Body Dressing : Set up;Sitting       Toilet Transfer: Min Administrator Details (indicate cue type and reason): simulated during in room transfers Toileting- Architect and Hygiene: Min guard;Sit to/from stand Toileting - Clothing Manipulation Details (indicate cue  type and reason): simulated during in room transfers     Functional mobility during ADLs: Min guard;Rolling walker General ADL Comments: Educated on safety such as sitting for LB ADLs, also reviewed importance of maintaining wbs. pt. with physical demonstration of agitation with cues and reminders "im not putting weight on it, it hurts, im just resting it there"      Vision                     Perception     Praxis      Cognition   Behavior During Therapy: Agitated Overall Cognitive Status: Within Functional Limits for tasks assessed                       Extremity/Trunk Assessment               Exercises     Shoulder Instructions       General Comments      Pertinent Vitals/ Pain       Pain Assessment: No/denies pain  Home Living                                          Prior Functioning/Environment              Frequency  Min 2X/week     Progress Toward Goals  OT Goals(current goals can now be found in the care plan section)  Progress towards OT goals: Progressing toward goals     Plan Discharge plan remains appropriate    Co-evaluation                 End of Session Equipment Utilized During Treatment: Gait belt;Rolling walker   Activity Tolerance Patient tolerated treatment well   Patient Left in chair;with call bell/phone within reach;with nursing/sitter in room   Nurse Communication Other (comment) (pt. requests to see the surgeon and review the procedure and  has questions )        Time: [id number] OT Time Calculation (min): 17 min  Charges: OT General Charges $OT Visit: 1 Procedure OT Treatments $Self Care/Home Management : 8-22 mins  Robet LeuMorris, Jillayne Witte Lorraine, COTA/L 10/28/2015, 8:21 AM

## 2015-10-28 NOTE — Anesthesia Postprocedure Evaluation (Signed)
Anesthesia Post Note  Patient: [name]  Procedure(s) Performed: Procedure(s) (LRB): Left Foot 5th Ray Amputation (Left)  Patient location during evaluation: PACU Anesthesia Type: MAC and Regional Level of consciousness: awake and alert Pain management: pain level controlled Vital Signs Assessment: post-procedure vital signs reviewed and stable Respiratory status: spontaneous breathing, nonlabored ventilation, respiratory function stable and patient connected to nasal cannula oxygen Cardiovascular status: stable and blood pressure returned to baseline Anesthetic complications: no                  Reino KentJudd, Connie Lasater J

## 2015-10-28 NOTE — Progress Notes (Signed)
Physical Therapy Treatment Patient Details Name: [name] MRN: [id number] DOB: 15-May-1951 Today's Date: 10/28/2015    History of Present Illness Pt is a 65 y.o. M s/p Lt femoral to below knee popliteal artery bypass.  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 Pt's PMH includes CAD, cigarette smoker, diabetic retinopathy, peripheral vascular disesase, DM, and dyslipidemia.    PT Comments    Patient progressing with tolerance to ambulation NWB on L LE.  Feel current technique sufficient, but is not maintaining strict NWB.  Patient has requested to speak with physician on reason why he has to be NWB.  Feel would be safe with girlfriend assist to enter home with one step up.  Follow Up Recommendations  Home health PT;Supervision for mobility/OOB     Equipment Recommendations  3in1 (PT)    Recommendations for Other Services       Precautions / Restrictions Precautions Precautions: Fall Required Braces or Orthoses: Other Brace/Splint Other Brace/Splint: post op shoe Restrictions LLE Weight Bearing: Non weight bearing Other Position/Activity Restrictions: order for post op shoe in chart    Mobility  Bed Mobility Overal bed mobility: Modified Independent Bed Mobility: Supine to Sit           General bed mobility comments: up in recliner  Transfers Overall transfer level: Needs assistance Equipment used: Rolling walker (2 wheeled) Transfers: Sit to/from Stand Sit to Stand: Modified independent (Device/Increase time) Stand pivot transfers: Min guard       General transfer comment: able to push up from recliner, maintains TDWB on L for balance (donned his post op shoe prior to stand--first time out of the box)  Ambulation/Gait Ambulation/Gait assistance: Supervision Ambulation Distance (Feet): 100 Feet Assistive device: Rolling walker (2 wheeled) Gait Pattern/deviations: Step-to pattern     General Gait Details:  tends to rest L foot on floor in between hops with walker but mainly resting heel on floor, encouraged NWB as much as possible   Stairs Stairs: Yes Stairs assistance: Min guard Stair Management: Backwards Number of Stairs: 1 General stair comments: demonstrated reverse technique with walker and pt simulated over flat flooring at colored tile; able to demonstrate technique, but not enough hop to have cleared a step  Wheelchair Mobility    Modified Rankin (Stroke Patients Only)       Balance     Sitting balance-Leahy Scale: Good     Standing balance support: Bilateral upper extremity supported Standing balance-Leahy Scale: Poor Standing balance comment: UE support due to NWB L                    Cognition Arousal/Alertness: Awake/alert Behavior During Therapy: WFL for tasks assessed/performed Overall Cognitive Status: Within Functional Limits for tasks assessed                      Exercises Other Exercises Other Exercises: performed toe AROM and R foot ankle pumps, encouraged to perform throughout the day    General Comments General comments (skin integrity, edema, etc.): Patient continues to relate frustration at not seeing MD.  States he wants to know why he has to hop as his goal is to walk without walker ASAP.  Encouraged patience and to direct questions to MD      Pertinent Vitals/Pain Pain Assessment: No/denies pain    Home Living  Prior Function            PT Goals (current goals can now be found in the care plan section) Progress towards PT goals: Progressing toward goals    Frequency  Min 3X/week    PT Plan Current plan remains appropriate    Co-evaluation             End of Session Equipment Utilized During Treatment: Gait belt Activity Tolerance: Patient limited by fatigue Patient left: with call bell/phone within reach;in chair     Time: [id number] PT Time Calculation (min) (ACUTE ONLY):  20 min  Charges:  $Gait Training: 8-22 mins                    G Codes:      Elray McgregorCynthia Elliett Guarisco 10/28/2015, 11:30 AM  Sheran Lawlessyndi Cella Cappello, PT [phone] 10/28/2015

## 2015-10-28 NOTE — Progress Notes (Signed)
  Progress Note    10/28/2015 7:17 AM 3 Days Post-Op  Subjective:  Wants to go home  Afebrile HR  60's NSR 120's-130's systolic 92% RA  Filed Vitals:   10/27/15 2108 10/28/15 0424  BP: 137/40 128/54  Pulse: 69 68  Temp: 98.1 F (36.7 C) 98.6 F (37 C)  Resp: 16 16    Physical Exam: Lungs:  Non labored Incisions:  Left groin and left below knee incision; left foot is wrapped Extremities:  Toes on left foot are warm.  Unable to assess doppler signals or pulses   CBC    Component Value Date/Time   WBC 13.3* 10/25/2015 0626   RBC 3.57* 10/25/2015 0626   HGB 11.8* 10/25/2015 0626   HCT 33.2* 10/25/2015 0626   PLT 387 10/25/2015 0626   MCV 93.0 10/25/2015 0626   MCH 33.1 10/25/2015 0626   MCHC 35.5 10/25/2015 0626   RDW 13.5 10/25/2015 0626    BMET    Component Value Date/Time   NA 135 10/25/2015 0626   K 4.4 10/25/2015 0626   CL 103 10/25/2015 0626   CO2 24 10/25/2015 0626   GLUCOSE 253* 10/25/2015 0626   BUN 23* 10/25/2015 0626   CREATININE 0.97 10/25/2015 0626   CALCIUM 8.2* 10/25/2015 0626   GFRNONAA >60 10/25/2015 0626   GFRAA >60 10/25/2015 0626    INR    Component Value Date/Time   INR 0.98 10/22/2015 1308     Intake/Output Summary (Last 24 hours) at 10/28/15 0717 Last data filed at 10/27/15 2110  Gross per 24 hour  Intake    480 ml  Output    650 ml  Net   -170 ml   ABI's 10/25/15:  RIGHT    LEFT    PRESSURE WAVEFORM  PRESSURE WAVEFORM  BRACHIAL 109 Triphasic BRACHIAL 117 Triphasic  DP 254 noncompressible  DP 94 Monophasic  PT 242 noncompressible PT 34 Monophasic    RIGHT LEFT  ABI Unable to obtain 0.80         Assessment:  65 y.o. male is s/p:  Left common femoral to below knee popliteal artery bypass graft with ipsilateral translocated non-reversed saphenous vein  3 Days Post-Op  Plan: -pt doing well this morning-getting around with a walker. -toes are warm on the left. Unable to assess  dopplers or pulses due to bandage.   -glucose values ok with insulin pulmp -pt would like to see Dr. Lajoyce Cornersuda today -non weightbearing  -DVT prophylaxis:  Lovenox -case management ordered for 3n1 commode & HHPT    Doreatha MassedSamantha Nadira Single, PA-C [facility] [phone] 10/28/2015 7:17 AM

## 2015-10-29 ENCOUNTER — Telehealth: Payer: Self-pay | Admitting: Surgery

## 2015-10-29 LAB — GLUCOSE, CAPILLARY
Glucose-Capillary: 210 mg/dL — ABNORMAL HIGH (ref 65–99)
Glucose-Capillary: 31 mg/dL — CL (ref 65–99)
Glucose-Capillary: 68 mg/dL (ref 65–99)
Glucose-Capillary: 218 mg/dL — ABNORMAL HIGH (ref 65–99)

## 2015-10-29 NOTE — Care Management Note (Signed)
Case Management Note  Patient Details  Name: [name] MRN: [id number] Date of Birth: September 26, 1950  Subjective/Objective:     femoral to below knee popliteal artery bypass graft, toe amputation               Action/Plan: NCM spoke to pt at bedside. Pt states he has RW at home. States his friend, [name] will assist him at home. States he does not want 3n1 at this time. [facility]. [facility] Contacted [facility] with new referral. He is going to address [address], [place] [address] [place]. States he has too many stairs at home.   Expected Discharge Date:  10/29/2015               Expected Discharge Plan:  Home w Home Health Services  In-House Referral:  NA  Discharge planning Services  CM Consult  Post Acute Care Choice:  Home Health Choice offered to:  Patient  DME Arranged:  N/A DME Agency:  NA  HH Arranged:  PT, OT HH Agency:  [facility]  Status of Service:  Completed, signed off  Medicare Important Message Given:    Date Medicare IM Given:    Medicare IM give by:    Date Additional Medicare IM Given:    Additional Medicare Important Message give by:     If discussed at Long Length of Stay Meetings, dates discussed:    Additional Comments:  Elliot CousinShavis, Cherrill Scrima Ellen, RN 10/29/2015, 12:46 PM

## 2015-10-29 NOTE — Progress Notes (Signed)
Physical Therapy Treatment Patient Details Name: [name] MRN: [id number] DOB: January 27, 1951 Today's Date: 10/29/2015    History of Present Illness Pt is a 65 y.o. M s/p Lt femoral to below knee popliteal artery bypass.  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 Pt's PMH includes CAD, cigarette smoker, diabetic retinopathy, peripheral vascular disesase, DM, and dyslipidemia.    PT Comments    Pt performed increased gait distance with increased fatigue.  Pt would benefit from HEP next visit to encourage strengthening in B UEs and B LEs.    Follow Up Recommendations  Home health PT;Supervision for mobility/OOB     Equipment Recommendations  3in1 (PT)    Recommendations for Other Services       Precautions / Restrictions Precautions Precautions: Fall Required Braces or Orthoses: Other Brace/Splint Other Brace/Splint: post op shoe Restrictions Weight Bearing Restrictions: Yes LLE Weight Bearing: Non weight bearing    Mobility  Bed Mobility Overal bed mobility: Modified Independent Bed Mobility: Supine to Sit     Supine to sit: Modified independent (Device/Increase time);HOB elevated     General bed mobility comments: used rails and good technique   Transfers Overall transfer level: Needs assistance Equipment used: Rolling walker (2 wheeled) Transfers: Sit to/from Stand Sit to Stand: Modified independent (Device/Increase time)         General transfer comment: able to push up from recliner, maintains TDWB on L for balance (donned his post op shoe prior to stand) Pt edcuated on continues use of post op shoe on LLE.  Pt also educated on importance of NWB during transfer techniques.    Ambulation/Gait Ambulation/Gait assistance: Supervision Ambulation Distance (Feet): 325 Feet (x 6 standing rest periods secondary to fatigue in B shoulders.  ) Assistive device: Rolling walker (2 wheeled) Gait Pattern/deviations:  Step-to pattern;Antalgic;Trunk flexed     General Gait Details: tends to rest L foot on floor in between hops with walker but mainly resting heel on floor, encouraged NWB as much as possible.  Pt able to maintain NWB 80% of tx time and requires min cues to correct.  Pt performed hop to pattern.     Stairs Stairs: Yes   Stair Management: No rails;Backwards Number of Stairs: 1 General stair comments: pt performed reverse technique to ascend x 1 stair with CGA.  Pt able to clear step during training.  Pt educated on sequencing and safety with RW placement.    Wheelchair Mobility    Modified Rankin (Stroke Patients Only)       Balance     Sitting balance-Leahy Scale: Good     Standing balance support: Bilateral upper extremity supported Standing balance-Leahy Scale: Fair (with RW)                      Cognition Arousal/Alertness: Awake/alert Behavior During Therapy: WFL for tasks assessed/performed Overall Cognitive Status: Within Functional Limits for tasks assessed                      Exercises      General Comments        Pertinent Vitals/Pain Pain Assessment: No/denies pain Pain Location: denies pain in LLE reports soreness in shoulders during gait training.   Pain Descriptors / Indicators: Grimacing;Guarding    Home Living                      Prior Function  PT Goals (current goals can now be found in the care plan section) Acute Rehab PT Goals Patient Stated Goal: go home Potential to Achieve Goals: Good Progress towards PT goals: Progressing toward goals    Frequency  Min 3X/week    PT Plan Current plan remains appropriate    Co-evaluation             End of Session Equipment Utilized During Treatment: Gait belt Activity Tolerance: Patient limited by fatigue Patient left: with call bell/phone within reach;in chair     Time: [id number] PT Time Calculation (min) (ACUTE ONLY): 19 min  Charges:   $Gait Training: 8-22 mins                    G Codes:      Florestine Aversimee J Emlyn Maves 10/29/2015, 10:14 AM  Joycelyn RuaAimee Keller Bounds, PTA pager [phone]

## 2015-10-29 NOTE — Progress Notes (Signed)
  Progress Note    10/29/2015 7:50 AM 4 Days Post-Op  Subjective:  Ready to go home/wants to speak to Dr. Lajoyce Cornersuda  Afebrile HR 60's NSR 130's systolic 100% RA  Filed Vitals:   10/28/15 2221 10/29/15 0550  BP: 133/59 134/53  Pulse: 69 59  Temp: 98.8 F (37.1 C) 98.1 F (36.7 C)  Resp: 18 15    Physical Exam: Lungs:  Non labored Incisions:  Left groin incision is c/d/i Extremities:  Left foot is wrapped-toes warm and motor is in tact.   CBC    Component Value Date/Time   WBC 13.3* 10/25/2015 0626   RBC 3.57* 10/25/2015 0626   HGB 11.8* 10/25/2015 0626   HCT 33.2* 10/25/2015 0626   PLT 387 10/25/2015 0626   MCV 93.0 10/25/2015 0626   MCH 33.1 10/25/2015 0626   MCHC 35.5 10/25/2015 0626   RDW 13.5 10/25/2015 0626    BMET    Component Value Date/Time   NA 135 10/25/2015 0626   K 4.4 10/25/2015 0626   CL 103 10/25/2015 0626   CO2 24 10/25/2015 0626   GLUCOSE 253* 10/25/2015 0626   BUN 23* 10/25/2015 0626   CREATININE 0.97 10/25/2015 0626   CALCIUM 8.2* 10/25/2015 0626   GFRNONAA >60 10/25/2015 0626   GFRAA >60 10/25/2015 0626    INR    Component Value Date/Time   INR 0.98 10/22/2015 1308     Intake/Output Summary (Last 24 hours) at 10/29/15 0750 Last data filed at 10/28/15 1300  Gross per 24 hour  Intake    480 ml  Output    600 ml  Net   -120 ml     Assessment:  65 y.o. male is s/p:  Left common femoral to below knee popliteal artery bypass graft with ipsilateral translocated non-reversed saphenous vein  5 Days Post-Op And  Left 5th ray amputation (Dr. Lajoyce Cornersuda) 4 Days Post-Op   Plan: -pt doing well-toes are warm and motor in tact.  -dressing in place from toe amp -pt anxious to speak with Dr. Vernelle Emeralduda-will try to reach him today. -pt may dc today -I've ordered face to face for HHPT/OT -DVT prophylaxis:  Lovenox -glucose low this am-given OJ; pt was not symptomatic.  Continue insulin pump and appreciate DM coordinator's assistance.   Doreatha MassedSamantha  Johann Santone, PA-C [facility] [phone] 10/29/2015 7:50 AM

## 2015-10-29 NOTE — Discharge Summary (Signed)
Discharge Summary     [name] 24-Dec-1950 65 y.o. male  [id number]  Admission Date: 10/24/2015  Discharge Date: 10/29/15  Physician: Nada Libman, MD  Admission Diagnosis: non-healing wound of left foot Gangrene Left 5th Toe   HPI:   This is a 65 y.o. male who resents with a nonhealing wound of his left foot. This started approximately a week ago. He developed an ulcer on the lateral aspect of his left fifth toe. He then developed redness extending into the left forefoot. He was seen by Dr. Timothy Lasso approximately 5 days ago and started on antibiotics. This was for a 10 day course. He saw Dr. Lajoyce Corners 2 days ago and was referred to our office. The patient previously had seen Dr. Myra Gianotti for mild claudication in 2012. At that point it was recommended that he try to quit smoking and he was started on Pletal. He is on Pletal aspirin and a statin. He currently smokes 1 pack of cigarettes per day. Greater than 3 minutes today spent regarding smoking cessation counseling. He was seen by Dr Nadara Eaton in June 2015 and noted to have a left superficial femoral artery occlusion. Dr Nadara Eaton was unable to pass a wire across this. The patient subsequently had a right superficial femoral artery atherectomy and angioplasty by Dr Nadara Eaton in December 2015. This was noted to be occluded 6 months later. The patient does not really describe claudication symptoms currently. Chronic medical problems include coronary artery disease, diabetes, hyperlipidemia of which are currently stable.  He recently underwent angiograpgy revealing an occlude left SFA. This could not be crossed. He is in today for bypass  Hospital Course:  The patient was admitted to the hospital and taken to the operating room on 10/24/2015 and underwent: Left common femoral to below knee popliteal artery bypass graft with ipsilateral translocated non-reversed saphenous vein    The pt tolerated the procedure well and was transported to the PACU  in good condition.   Given his type I diabetes, a diabetic coordinator was obtained.    By POD 1, he was doing well.  He did have a +doppler signal in the left AT/peroneal.  Later that afternoon, he returned to the operating room and underwent left foot 5th ray amputation.  He was fitted for a post op shoe.    By POD 2, he was transferred to the telemetry floor.  His foley was discontinued.  The diabetic coordinator made recommendations for his insulin pump and orders placed.  PT & OT recommended HHPT/OT.  He is to continue NWB on the left foot and wound care per Dr. Lajoyce Corners.    ABI's 10/25/15:  RIGHT    LEFT    PRESSURE WAVEFORM  PRESSURE WAVEFORM  BRACHIAL 109 Triphasic BRACHIAL 117 Triphasic  DP 254 noncompressible  DP 94 Monophasic  PT 242 noncompressible PT 34 Monophasic    RIGHT LEFT  ABI Unable to obtain 0.80        The remainder of the hospital course consisted of increasing mobilization and increasing intake of solids without difficulty.  CBC    Component Value Date/Time   WBC 13.3* 10/25/2015 0626   RBC 3.57* 10/25/2015 0626   HGB 11.8* 10/25/2015 0626   HCT 33.2* 10/25/2015 0626   PLT 387 10/25/2015 0626   MCV 93.0 10/25/2015 0626   MCH 33.1 10/25/2015 0626   MCHC 35.5 10/25/2015 0626   RDW 13.5 10/25/2015 0626    BMET    Component Value Date/Time   NA  135 10/25/2015 0626   K 4.4 10/25/2015 0626   CL 103 10/25/2015 0626   CO2 24 10/25/2015 0626   GLUCOSE 253* 10/25/2015 0626   BUN 23* 10/25/2015 0626   CREATININE 0.97 10/25/2015 0626   CALCIUM 8.2* 10/25/2015 0626   GFRNONAA >60 10/25/2015 0626   GFRAA >60 10/25/2015 0626     Discharge Instructions    Call MD for:  redness, tenderness, or signs of infection (pain, swelling, bleeding, redness, odor or Vasbinder/yellow discharge around incision site)    Complete by:  As directed      Call MD for:  severe or increased pain, loss or decreased feeling  in affected limb(s)     Complete by:  As directed      Call MD for:  temperature >100.5    Complete by:  As directed      Discharge instructions    Complete by:  As directed   Minimal weight bearing on left foot.  Return to see Dr. Lajoyce Corners in 2 weeks.     Discharge wound care:    Complete by:  As directed   Wash the groin wound with soap and water daily and pat dry. (No tub bath-only shower)  Then put a dry gauze or washcloth there to keep this area dry daily and as needed.  Do not use Vaseline or neosporin on your incisions.  Only use soap and water on your incisions and then protect and keep dry.     Driving Restrictions    Complete by:  As directed   No driving for 2 weeks     Lifting restrictions    Complete by:  As directed   No lifting for 4 weeks     Resume previous diet    Complete by:  As directed            Discharge Diagnosis:  non-healing wound of left foot Gangrene Left 5th Toe  Secondary Diagnosis: Patient Active Problem List   Diagnosis Date Noted  . PAD (peripheral artery disease) (HCC) 10/24/2015  . Diabetes mellitus with peripheral artery disease (HCC) 07/30/2014  . Claudication in peripheral vascular disease (HCC) 07/30/2014  . IDDM (insulin dependent diabetes mellitus) (HCC) 01/23/2013  . CAD (coronary artery disease) 04/15/2011  . Hyperlipidemia 04/15/2011   Past Medical History  Diagnosis Date  . Coronary artery disease   . Peripheral vascular disease (HCC)   . Dyslipidemia   . Cigarette smoker   . Diabetes mellitus     type 1  x 50 yrs  . Diabetic retinopathy (HCC)        Medication List    TAKE these medications        aspirin EC 325 MG tablet  Take 325 mg by mouth daily.     baclofen 20 MG tablet  Commonly known as:  LIORESAL  Take 20 mg by mouth 2 (two) times daily.     cilostazol 100 MG tablet  Commonly known as:  PLETAL  Take 1 tablet (100 mg total) by mouth 2 (two) times daily.     doxycycline 100 MG capsule  Commonly known as:  VIBRAMYCIN  Take 100  mg by mouth 2 (two) times daily. 10 day regimen started 2/13     escitalopram 10 MG tablet  Commonly known as:  LEXAPRO  Take 10 mg by mouth daily.     folic acid 800 MCG tablet  Commonly known as:  FOLVITE  Take 1,600 mcg by mouth daily.  HYDROcodone-acetaminophen 5-325 MG tablet  Commonly known as:  NORCO/VICODIN  Take 1 tablet by mouth every 6 (six) hours as needed for moderate pain.     insulin aspart 100 UNIT/ML injection  Commonly known as:  novoLOG  Inject into the skin. Patient uses in Insulin Pump.     insulin pump Soln  Inject into the skin daily. Novolog     metoprolol succinate 25 MG 24 hr tablet  Commonly known as:  TOPROL-XL  Take 1 tablet (25 mg total) by mouth daily.     nitroGLYCERIN 0.2 mg/hr patch  Commonly known as:  NITRODUR - Dosed in mg/24 hr  Place 0.2 mg onto the skin daily.     omeprazole 20 MG capsule  Commonly known as:  PRILOSEC  Take 1 capsule (20 mg total) by mouth 2 (two) times daily before a meal. BID for 7 days than daily     pravastatin 40 MG tablet  Commonly known as:  PRAVACHOL  Take 40 mg by mouth daily.     ramipril 10 MG tablet  Commonly known as:  ALTACE  Take 10 mg by mouth daily.        Prescriptions given: 1.  Vicodin  #30 No Refill  Instructions: 1.  Wash the groin wound with soap and water daily and pat dry. (No tub bath-only shower)  Then put a dry gauze or washcloth there to keep this area dry daily and as needed.  Do not use Vaseline or neosporin on your incisions.  Only use soap and water on your incisions and then protect and keep dry.  Disposition: home  Patient's condition: is Good  Follow up: 1. Dr. Myra GianottiBrabham in 2 weeks 2. Dr. Erasmo Scoreuda   Samantha Rhyne, PA-C [facility] [phone] 10/29/2015  11:59 AM  - For VQI Registry use --- Instructions: Press F2 to tab through selections.  Delete question if not applicable.   Post-op:  Wound infection: No  Graft infection: No  Transfusion:  No  If yes, n/a units given New Arrhythmia: No Ipsilateral amputation: No, [ ]  Minor, [ ]  BKA, [ ]  AKA Discharge patency: [x ] Primary, [ ]  Primary assisted, [ ]  Secondary, [ ]  Occluded Patency judged by: [x ] Dopper only, [ ]  Palpable graft pulse, []  Palpable distal pulse, [ ]  ABI inc. > 0.15, [ ]  Duplex Discharge ABI: R unable to obtain, L 0.80 D/C Ambulatory Status: Ambulatory with Assistance  Complications: MI: No, [ ]  Troponin only, [ ]  EKG or Clinical CHF: No Resp failure:No, [ ]  Pneumonia, [ ]  Ventilator Chg in renal function: No, [ ]  Inc. Cr > 0.5, [ ]  Temp. Dialysis, [ ]  Permanent dialysis Stroke: No, [ ]  Minor, [ ]  Major Return to OR: No  Reason for return to OR: [ ]  Bleeding, [ ]  Infection, [ ]  Thrombosis, [ ]  Revision  Discharge medications: Statin use:  yes ASA use:  yes Plavix use:  no Beta blocker use: yes ACEI use:   yes ARB use:  no Coumadin use: no

## 2015-10-29 NOTE — Telephone Encounter (Signed)
Spoke with pt to schedule, dpm °

## 2015-10-29 NOTE — Progress Notes (Signed)
[id number]- Pt BS 31. Pt asymptomatic. Given OJ, Graham crackers and peanut butter.   0720- Pt blood sugar rechecked. 68. Day nurse notified. Pt states will wait to eat breakfast.   Sandrea HammondJunris Madissen Wyse RN

## 2015-10-29 NOTE — Telephone Encounter (Signed)
-----   Message from Sharee PimpleMarilyn K McChesney, RN sent at 10/24/2015  5:35 PM EST ----- Regarding: schedule   ----- Message -----    From: Dara LordsSamantha J Rhyne, PA-C    Sent: 10/24/2015   3:29 PM      To: [facility] Charge Pool  S/p left fem pop bypass 10/24/15.  F/u with Dr. Myra GianottiBrabham in 2 weeks.  Thanks, Lelon MastSamantha

## 2015-10-29 NOTE — Discharge Instructions (Signed)
Wound care of left foot and shower per Dr. Lajoyce Cornersuda.  Non weight bearing left foot.

## 2015-11-06 ENCOUNTER — Telehealth: Payer: Self-pay | Admitting: *Deleted

## 2015-11-06 NOTE — Telephone Encounter (Signed)
[name], Physical [facility]rap[name] called sating that patient had refused PT saying that he was doing everything that he should be doing and would be be seen at his follow up appointment next week.

## 2015-11-11 ENCOUNTER — Encounter: Payer: BLUE CROSS/BLUE SHIELD | Admitting: Surgery

## 2015-11-11 ENCOUNTER — Encounter: Payer: Self-pay | Admitting: Surgery

## 2015-11-15 ENCOUNTER — Encounter: Payer: Self-pay | Admitting: Surgery

## 2015-11-15 ENCOUNTER — Ambulatory Visit (INDEPENDENT_AMBULATORY_CARE_PROVIDER_SITE_OTHER): Payer: BLUE CROSS/BLUE SHIELD | Admitting: Surgery

## 2015-11-15 ENCOUNTER — Encounter: Payer: BLUE CROSS/BLUE SHIELD | Admitting: Surgery

## 2015-11-15 VITALS — BP 118/53 | HR 87 | Temp 97.5°F | Resp 16 | Ht 71.0 in | Wt 165.2 lb

## 2015-11-15 DIAGNOSIS — T814XXD Infection following a procedure, subsequent encounter: Secondary | ICD-10-CM

## 2015-11-15 DIAGNOSIS — IMO0001 Reserved for inherently not codable concepts without codable children: Secondary | ICD-10-CM

## 2015-11-15 MED ORDER — CEPHALEXIN 500 MG PO CAPS: 500.0000 mg | ORAL_CAPSULE | Freq: Three times a day (TID) | ORAL | Status: DC

## 2015-11-15 NOTE — Progress Notes (Signed)
Patient name: [name] MRN: [id number] DOB: Jun 26, 1951 Sex: male     Chief Complaint  Patient presents with  . Routine Post Op    follow up    HISTORY OF PRESENT ILLNESS: The patient is back for his first postoperative visit.  He is status post left common femoral to below-knee popliteal artery bypass graft with ipsilateral translocated non-reverse saphenous vein on 10/24/2015.  This was secondary to left fifth toe gangrene.  He is status post amputation by Dr. Lajoyce Cornersuda.  He has no complaints today.  He is one a compression stocking on his left leg  Past Medical History  Diagnosis Date  . Coronary artery disease   . Peripheral vascular disease (HCC)   . Dyslipidemia   . Cigarette smoker   . Diabetes mellitus     type 1  x 50 yrs  . Diabetic retinopathy [facility](HCC)     Past Surgical History  Procedure Laterality Date  . Lower extremity angiogram N/A 01/23/2014    Procedure: LOWER EXTREMITY ANGIOGRAM;  Surgeon: Pamella PertJagadeesh R Ganji, MD;  [facility];  Service: Cardiovascular;  Laterality: N/A;  . Abdominal angiogram N/A 05/29/2014    Procedure: ABDOMINAL ANGIOGRAM;  Surgeon: Pamella PertJagadeesh R Ganji, MD;  [facility];  Service: Cardiovascular;  Laterality: N/A;  . Lower extremity angiogram N/A 07/31/2014    Procedure: LOWER EXTREMITY ANGIOGRAM;  Surgeon: Pamella PertJagadeesh R Ganji, MD;  [facility];  Service: Cardiovascular;  Laterality: N/A;  . Peripheral vascular catheterization Left 10/11/2015    Procedure: Peripheral Vascular Balloon Angioplasty;  Surgeon: Nada LibmanVance W Kamaiya Antilla, MD;  Location: [facility] INVASIVE CV LAB;  Service: Cardiovascular;  Laterality: Left;  sfa failed unable to cross occluded sfa  . Peripheral vascular catheterization N/A 10/11/2015    Procedure: Abdominal Aortogram;  Surgeon: Nada LibmanVance W Luwanda Starr, MD;  Location: [facility] INVASIVE CV LAB;  Service: Cardiovascular;  Laterality: N/A;  . Lower extremity angiogram Left 10/11/2015    Procedure: Lower Extremity Angiogram;   Surgeon: Nada LibmanVance W Abbigale Mcelhaney, MD;  [facility];  Service: Cardiovascular;  Laterality: Left;  . Cardiac catheterization      EF is 55-60% and no wall motion abnormalities (long long time ago)  . Fracture surgery      left arm "many yrs ago"  . Femoral-popliteal bypass graft Left 10/24/2015    Procedure: BYPASS GRAFT FEMORAL below knee POPLITEAL ARTERY with Left Saphenous Vein;  Surgeon: Nada LibmanVance W Arvis Zwahlen, MD;  [facility];  Service: Vascular;  Laterality: Left;  . Amputation Left 10/25/2015    Procedure: Left Foot 5th Ray Amputation;  Surgeon: Nadara MustardMarcus Duda V, MD;  [facility];  Service: Orthopedics;  Laterality: Left;    Social History   Social History  . Marital Status: Widowed    Spouse Name: N/A  . Number of Children: N/A  . Years of Education: N/A   Occupational History  . Not on file.   Social History Main Topics  . Smoking status: Current Every Day Smoker -- 1.00 packs/day for 30 years    Types: Cigarettes  . Smokeless tobacco: Never Used  . Alcohol Use: No     Comment: on occasion wine  . Drug Use: No  . Sexual Activity: Not on file   Other Topics Concern  . Not on file   Social History Narrative    Family History  Problem Relation Age of Onset  . Coronary artery disease Mother     Allergies as  of 11/15/2015 - Review Complete 11/15/2015  Allergen Reaction Noted  . Simvastatin Other (See Comments) 04/15/2011    Current Outpatient Prescriptions on File Prior to Visit  Medication Sig Dispense Refill  . aspirin EC 325 MG tablet Take 325 mg by mouth daily.    . baclofen (LIORESAL) 20 MG tablet Take 20 mg by mouth 2 (two) times daily.  0  . cilostazol (PLETAL) 100 MG tablet Take 1 tablet (100 mg total) by mouth 2 (two) times daily. (Patient taking differently: Take 100 mg by mouth at bedtime. ) 60 tablet 6  . doxycycline (VIBRAMYCIN) 100 MG capsule Take 100 mg by mouth 2 (two) times daily. 10 day regimen started 2/13  0  . escitalopram (LEXAPRO) 10  MG tablet Take 10 mg by mouth daily.  1  . folic acid (FOLVITE) 800 MCG tablet Take 1,600 mcg by mouth daily.     Marland Kitchen HYDROcodone-acetaminophen (NORCO/VICODIN) 5-325 MG tablet Take 1 tablet by mouth every 6 (six) hours as needed for moderate pain. 30 tablet 0  . insulin aspart (NOVOLOG) 100 UNIT/ML injection Inject into the skin. Patient uses in Insulin Pump.    . Insulin Human (INSULIN PUMP) 100 unit/ml SOLN Inject into the skin daily. Novolog    . metoprolol succinate (TOPROL-XL) 25 MG 24 hr tablet Take 1 tablet (25 mg total) by mouth daily. 30 tablet 1  . nitroGLYCERIN (NITRODUR - DOSED IN MG/24 HR) 0.2 mg/hr patch Place 0.2 mg onto the skin daily.    . pravastatin (PRAVACHOL) 40 MG tablet Take 40 mg by mouth daily.     . ramipril (ALTACE) 10 MG tablet Take 10 mg by mouth daily.      Marland Kitchen omeprazole (PRILOSEC) 20 MG capsule Take 1 capsule (20 mg total) by mouth 2 (two) times daily before a meal. BID for 7 days than daily (Patient taking differently: Take 20 mg by mouth daily. ) 40 capsule 0   No current facility-administered medications on file prior to visit.       PHYSICAL EXAMINATION:   Vital signs are  Filed Vitals:   11/15/15 1552  BP: 118/53  Pulse: 87  Temp: 97.5 F (36.4 C)  TempSrc: Oral  Resp: 16  Height:  (1.803 m)  Weight: 165 lb 3.2 oz (74.934 kg)  SpO2: 97%   Body mass index is 23.05 kg/(m^2). General: The patient appears their stated age. The patient's incisions have healed nicely.  There is some redness around the femoral incision and a small seroma.   Diagnostic Studies None  Assessment: Status post femoral popliteal bypass graft for lower extremity ulcer Plan: The patient has done very well.  He does have a fair amount of swelling in his left leg which is not surprising.  I have reassured him that this should get better.  I have encouraged him to continue to wear his compression stocking.  I am giving him 1 week's worth of Keflex for the redness in his  left groin.  He'll follow up with me in one month for repeat evaluation  V. Charlena Cross, M.D. [facility] [place] Office: [phone] Pager:  [phone]

## 2015-12-04 ENCOUNTER — Encounter: Payer: Self-pay | Admitting: Surgery

## 2015-12-05 ENCOUNTER — Other Ambulatory Visit (HOSPITAL_COMMUNITY): Payer: Self-pay | Admitting: Family

## 2015-12-05 ENCOUNTER — Encounter (HOSPITAL_COMMUNITY): Payer: Self-pay | Admitting: *Deleted

## 2015-12-05 NOTE — Progress Notes (Signed)
[name], Diabetic Nurse Coordinator returned page. Notified of pt having surgery tomorrow with an Insulin pump.

## 2015-12-05 NOTE — Progress Notes (Signed)
Pt denies cardiac history, chest pain or sob. Pt is diabetic, last A1C was 9.1 on 10/24/15. Pt states his fasting blood sugar can be around 150. Pt is type 1 and is on an insulin pump. Pt instructed to reduce basal rate by 20% at midnight tonight. Instructed pt to check his blood sugar every 2 hours after waking up in AM. Instructed pt if blood sugar is 70 or less, to treat with 1/2 cup (4 oz) of clear juice (apple or cranberry) and to recheck his blood sugar 15 minutes after drinking juice. If still 70 or below, instructed pt to call Short Stay and speak with a nurse. Phone # given to pt. He voiced understanding. States he has a hard adjusting his pump at times, but he states he will try to get it done tonight.  Paged Diabetic Coordinator to notify her of pt's insulin pump. Awaiting return call.

## 2015-12-06 ENCOUNTER — Inpatient Hospital Stay (HOSPITAL_COMMUNITY): Payer: BLUE CROSS/BLUE SHIELD | Admitting: Anesthesiology

## 2015-12-06 ENCOUNTER — Inpatient Hospital Stay (HOSPITAL_COMMUNITY): Payer: BLUE CROSS/BLUE SHIELD

## 2015-12-06 ENCOUNTER — Encounter (HOSPITAL_COMMUNITY): Payer: Self-pay | Admitting: *Deleted

## 2015-12-06 ENCOUNTER — Inpatient Hospital Stay (HOSPITAL_COMMUNITY)
Admission: RE | Admit: 2015-12-06 | Discharge: 2015-12-11 | DRG: 240 | Disposition: A | Discharge status: Skilled Nursing Facility | Payer: BLUE CROSS/BLUE SHIELD | Source: Ambulatory Visit | Attending: Orthopedic Surgery | Admitting: Orthopedic Surgery

## 2015-12-06 ENCOUNTER — Encounter (HOSPITAL_COMMUNITY)
Admission: RE | Disposition: A | Discharge status: Skilled Nursing Facility | Payer: Self-pay | Source: Ambulatory Visit | Attending: Orthopedic Surgery

## 2015-12-06 DIAGNOSIS — I10 Essential (primary) hypertension: Secondary | ICD-10-CM | POA: Diagnosis present

## 2015-12-06 DIAGNOSIS — E10319 Type 1 diabetes mellitus with unspecified diabetic retinopathy without macular edema: Secondary | ICD-10-CM | POA: Diagnosis present

## 2015-12-06 DIAGNOSIS — E1052 Type 1 diabetes mellitus with diabetic peripheral angiopathy with gangrene: Secondary | ICD-10-CM | POA: Diagnosis present

## 2015-12-06 DIAGNOSIS — I96 Gangrene, not elsewhere classified: Secondary | ICD-10-CM

## 2015-12-06 DIAGNOSIS — I2510 Atherosclerotic heart disease of native coronary artery without angina pectoris: Secondary | ICD-10-CM | POA: Diagnosis present

## 2015-12-06 DIAGNOSIS — E785 Hyperlipidemia, unspecified: Secondary | ICD-10-CM | POA: Diagnosis present

## 2015-12-06 DIAGNOSIS — Z9641 Presence of insulin pump (external) (internal): Secondary | ICD-10-CM | POA: Diagnosis present

## 2015-12-06 DIAGNOSIS — E1152 Type 2 diabetes mellitus with diabetic peripheral angiopathy with gangrene: Secondary | ICD-10-CM | POA: Diagnosis present

## 2015-12-06 DIAGNOSIS — K219 Gastro-esophageal reflux disease without esophagitis: Secondary | ICD-10-CM | POA: Diagnosis present

## 2015-12-06 DIAGNOSIS — Z89422 Acquired absence of other left toe(s): Secondary | ICD-10-CM

## 2015-12-06 DIAGNOSIS — F17210 Nicotine dependence, cigarettes, uncomplicated: Secondary | ICD-10-CM | POA: Diagnosis present

## 2015-12-06 DIAGNOSIS — J449 Chronic obstructive pulmonary disease, unspecified: Secondary | ICD-10-CM | POA: Diagnosis present

## 2015-12-06 DIAGNOSIS — S88112A Complete traumatic amputation at level between knee and ankle, left lower leg, initial encounter: Secondary | ICD-10-CM

## 2015-12-06 DIAGNOSIS — Z888 Allergy status to other drugs, medicaments and biological substances status: Secondary | ICD-10-CM

## 2015-12-06 DIAGNOSIS — IMO0002 Reserved for concepts with insufficient information to code with codable children: Secondary | ICD-10-CM

## 2015-12-06 HISTORY — PX: AMPUTATION: SHX166

## 2015-12-06 LAB — CBC
HCT: 36.4 % — ABNORMAL LOW (ref 39.0–52.0)
Hemoglobin: 11.9 g/dL — ABNORMAL LOW (ref 13.0–17.0)
MCH: 30.4 pg (ref 26.0–34.0)
MCHC: 32.7 g/dL (ref 30.0–36.0)
MCV: 93.1 fL (ref 78.0–100.0)
PLATELETS: 486 10*3/uL — AB (ref 150–400)
RBC: 3.91 MIL/uL — AB (ref 4.22–5.81)
RDW: 14 % (ref 11.5–15.5)
WBC: 10.5 10*3/uL (ref 4.0–10.5)

## 2015-12-06 LAB — POCT I-STAT 4, (NA,K, GLUC, HGB,HCT)
Glucose, Bld: 144 mg/dL — ABNORMAL HIGH (ref 65–99)
HEMATOCRIT: 33 % — AB (ref 39.0–52.0)
Hemoglobin: 11.2 g/dL — ABNORMAL LOW (ref 13.0–17.0)
Potassium: 4.4 mmol/L (ref 3.5–5.1)
Sodium: 138 mmol/L (ref 135–145)

## 2015-12-06 LAB — BASIC METABOLIC PANEL
Anion gap: 11 (ref 5–15)
BUN: 17 mg/dL (ref 4–21)
BUN: 17 mg/dL (ref 6–20)
CALCIUM: 9.2 mg/dL (ref 8.9–10.3)
CO2: 22 mmol/L (ref 22–32)
CREATININE: 0.87 mg/dL (ref 0.61–1.24)
CREATININE: 0.9 mg/dL (ref 0.6–1.3)
Chloride: 106 mmol/L (ref 101–111)
GFR calc non Af Amer: >60 mL/min (ref >60)
GLUCOSE: 139 mg/dL
Glucose, Bld: 139 mg/dL — ABNORMAL HIGH (ref 65–99)
Potassium: 5.5 mmol/L — ABNORMAL HIGH (ref 3.5–5.1)
SODIUM: 139 mmol/L (ref 135–145)
SODIUM: 139 mmol/L (ref 137–147)

## 2015-12-06 LAB — GLUCOSE, CAPILLARY
GLUCOSE-CAPILLARY: 115 mg/dL — AB (ref 65–99)
GLUCOSE-CAPILLARY: 118 mg/dL — AB (ref 65–99)
GLUCOSE-CAPILLARY: 99 mg/dL (ref 65–99)
Glucose-Capillary: 116 mg/dL — ABNORMAL HIGH (ref 65–99)
Glucose-Capillary: 82 mg/dL (ref 65–99)

## 2015-12-06 LAB — CBC AND DIFFERENTIAL: WBC: 10.5 10^3/mL

## 2015-12-06 SURGERY — AMPUTATION BELOW KNEE
Anesthesia: General | Laterality: Left

## 2015-12-06 MED ORDER — FENTANYL CITRATE (PF) 250 MCG/5ML IJ SOLN
INTRAMUSCULAR | Status: AC
  Filled 2015-12-06: qty 5

## 2015-12-06 MED ORDER — HYDROMORPHONE HCL 1 MG/ML IJ SOLN
INTRAMUSCULAR | Status: AC
  Filled 2015-12-06: qty 1

## 2015-12-06 MED ORDER — MIDAZOLAM HCL 2 MG/2ML IJ SOLN
INTRAMUSCULAR | Status: AC
  Filled 2015-12-06: qty 2

## 2015-12-06 MED ORDER — CEFAZOLIN SODIUM-DEXTROSE 2-4 GM/100ML-% IV SOLN
INTRAVENOUS | Status: AC
  Filled 2015-12-06: qty 100

## 2015-12-06 MED ORDER — DEXTROSE 5 % IV SOLN: 500.0000 mg | Freq: Four times a day (QID) | INTRAVENOUS | Status: DC | PRN

## 2015-12-06 MED ORDER — CEFAZOLIN SODIUM-DEXTROSE 2-4 GM/100ML-% IV SOLN
2.0000 g | INTRAVENOUS | Status: AC
  Administered 2015-12-06: 2 g via INTRAVENOUS
  Filled 2015-12-06: qty 100

## 2015-12-06 MED ORDER — METHOCARBAMOL 500 MG PO TABS
ORAL_TABLET | ORAL | Status: AC
  Filled 2015-12-06: qty 1

## 2015-12-06 MED ORDER — ONDANSETRON HCL 4 MG/2ML IJ SOLN
4.0000 mg | Freq: Four times a day (QID) | INTRAMUSCULAR | Status: DC | PRN
  Administered 2015-12-06: 4 mg via INTRAVENOUS
  Filled 2015-12-06: qty 2

## 2015-12-06 MED ORDER — ASPIRIN EC 325 MG PO TBEC
325.0000 mg | DELAYED_RELEASE_TABLET | Freq: Every day | ORAL | Status: DC
  Administered 2015-12-07 – 2015-12-11 (×5): 325 mg via ORAL
  Filled 2015-12-06 (×6): qty 1

## 2015-12-06 MED ORDER — PANTOPRAZOLE SODIUM 40 MG PO TBEC
40.0000 mg | DELAYED_RELEASE_TABLET | Freq: Every day | ORAL | Status: DC
  Administered 2015-12-07 – 2015-12-11 (×5): 40 mg via ORAL
  Filled 2015-12-06 (×6): qty 1

## 2015-12-06 MED ORDER — MIDAZOLAM HCL 2 MG/2ML IJ SOLN
INTRAMUSCULAR | Status: DC | PRN
  Administered 2015-12-06: 2 mg via INTRAVENOUS

## 2015-12-06 MED ORDER — OXYCODONE HCL 5 MG PO TABS
ORAL_TABLET | ORAL | Status: AC
  Filled 2015-12-06: qty 2

## 2015-12-06 MED ORDER — ACETAMINOPHEN 325 MG PO TABS
650.0000 mg | ORAL_TABLET | Freq: Four times a day (QID) | ORAL | Status: DC | PRN
  Administered 2015-12-08 (×2): 650 mg via ORAL
  Filled 2015-12-06 (×2): qty 2

## 2015-12-06 MED ORDER — HYDROMORPHONE HCL 1 MG/ML IJ SOLN
0.2500 mg | INTRAMUSCULAR | Status: DC | PRN
  Administered 2015-12-06 (×5): 0.5 mg via INTRAVENOUS

## 2015-12-06 MED ORDER — FOLIC ACID 1 MG PO TABS
1500.0000 ug | ORAL_TABLET | Freq: Every day | ORAL | Status: DC
  Administered 2015-12-07 – 2015-12-10 (×4): 1.5 mg via ORAL
  Administered 2015-12-11: 1 mg via ORAL
  Filled 2015-12-06 (×6): qty 2

## 2015-12-06 MED ORDER — CEFAZOLIN SODIUM 1-5 GM-% IV SOLN
1.0000 g | Freq: Four times a day (QID) | INTRAVENOUS | Status: AC
  Administered 2015-12-06 – 2015-12-07 (×3): 1 g via INTRAVENOUS
  Filled 2015-12-06 (×4): qty 50

## 2015-12-06 MED ORDER — METHOCARBAMOL 500 MG PO TABS
500.0000 mg | ORAL_TABLET | Freq: Four times a day (QID) | ORAL | Status: DC | PRN
  Administered 2015-12-06 – 2015-12-08 (×3): 500 mg via ORAL
  Filled 2015-12-06 (×2): qty 1

## 2015-12-06 MED ORDER — LIDOCAINE HCL (CARDIAC) 20 MG/ML IV SOLN
INTRAVENOUS | Status: DC | PRN
  Administered 2015-12-06: 20 mg via INTRATRACHEAL

## 2015-12-06 MED ORDER — BISACODYL 10 MG RE SUPP: 10.0000 mg | Freq: Every day | RECTAL | Status: DC | PRN

## 2015-12-06 MED ORDER — MIDAZOLAM HCL 2 MG/2ML IJ SOLN
0.5000 mg | Freq: Once | INTRAMUSCULAR | Status: AC | PRN
  Administered 2015-12-06: 1 mg via INTRAVENOUS

## 2015-12-06 MED ORDER — DOCUSATE SODIUM 100 MG PO CAPS
100.0000 mg | ORAL_CAPSULE | Freq: Two times a day (BID) | ORAL | Status: DC
  Administered 2015-12-06 – 2015-12-11 (×10): 100 mg via ORAL
  Filled 2015-12-06 (×10): qty 1

## 2015-12-06 MED ORDER — RAMIPRIL 10 MG PO CAPS
10.0000 mg | ORAL_CAPSULE | Freq: Every day | ORAL | Status: DC
  Administered 2015-12-07 – 2015-12-11 (×5): 10 mg via ORAL
  Filled 2015-12-06 (×6): qty 1

## 2015-12-06 MED ORDER — SODIUM CHLORIDE 0.9 % IV SOLN
INTRAVENOUS | Status: DC | PRN
  Administered 2015-12-06 (×2): via INTRAVENOUS

## 2015-12-06 MED ORDER — PRAVASTATIN SODIUM 40 MG PO TABS
40.0000 mg | ORAL_TABLET | Freq: Every day | ORAL | Status: DC
  Administered 2015-12-06 – 2015-12-10 (×5): 40 mg via ORAL
  Filled 2015-12-06 (×5): qty 1

## 2015-12-06 MED ORDER — FENTANYL CITRATE (PF) 250 MCG/5ML IJ SOLN
INTRAMUSCULAR | Status: DC | PRN
  Administered 2015-12-06: 100 ug via INTRAVENOUS
  Administered 2015-12-06: 50 ug via INTRAVENOUS
  Administered 2015-12-06: 150 ug via INTRAVENOUS
  Administered 2015-12-06 (×2): 100 ug via INTRAVENOUS

## 2015-12-06 MED ORDER — CILOSTAZOL 50 MG PO TABS
100.0000 mg | ORAL_TABLET | Freq: Every day | ORAL | Status: DC
  Administered 2015-12-06 – 2015-12-10 (×5): 100 mg via ORAL
  Filled 2015-12-06 (×5): qty 2

## 2015-12-06 MED ORDER — LACTATED RINGERS IV SOLN
INTRAVENOUS | Status: DC
  Administered 2015-12-06: 12:00:00 via INTRAVENOUS

## 2015-12-06 MED ORDER — ONDANSETRON HCL 4 MG/2ML IJ SOLN
INTRAMUSCULAR | Status: AC
  Filled 2015-12-06: qty 2

## 2015-12-06 MED ORDER — ONDANSETRON HCL 4 MG/2ML IJ SOLN
INTRAMUSCULAR | Status: DC | PRN
  Administered 2015-12-06: 4 mg via INTRAVENOUS

## 2015-12-06 MED ORDER — OXYCODONE HCL 5 MG PO TABS
5.0000 mg | ORAL_TABLET | ORAL | Status: DC | PRN
  Administered 2015-12-06 – 2015-12-09 (×8): 10 mg via ORAL
  Filled 2015-12-06 (×9): qty 2

## 2015-12-06 MED ORDER — METOCLOPRAMIDE HCL 5 MG/ML IJ SOLN
5.0000 mg | Freq: Three times a day (TID) | INTRAMUSCULAR | Status: DC | PRN
  Administered 2015-12-06: 5 mg via INTRAVENOUS
  Filled 2015-12-06: qty 2

## 2015-12-06 MED ORDER — METOCLOPRAMIDE HCL 5 MG PO TABS: 5.0000 mg | ORAL_TABLET | Freq: Three times a day (TID) | ORAL | Status: DC | PRN

## 2015-12-06 MED ORDER — FENTANYL CITRATE (PF) 100 MCG/2ML IJ SOLN
INTRAMUSCULAR | Status: AC
  Filled 2015-12-06: qty 2

## 2015-12-06 MED ORDER — SODIUM CHLORIDE 0.9 % IV SOLN
INTRAVENOUS | Status: DC
  Administered 2015-12-06: 16:00:00 via INTRAVENOUS

## 2015-12-06 MED ORDER — MEPERIDINE HCL 25 MG/ML IJ SOLN: 6.2500 mg | INTRAMUSCULAR | Status: DC | PRN

## 2015-12-06 MED ORDER — BACLOFEN 10 MG PO TABS: 20.0000 mg | ORAL_TABLET | Freq: Every day | ORAL | Status: DC | PRN

## 2015-12-06 MED ORDER — ACETAMINOPHEN 650 MG RE SUPP: 650.0000 mg | Freq: Four times a day (QID) | RECTAL | Status: DC | PRN

## 2015-12-06 MED ORDER — PROPOFOL 10 MG/ML IV BOLUS
INTRAVENOUS | Status: DC | PRN
  Administered 2015-12-06: 200 mg via INTRAVENOUS

## 2015-12-06 MED ORDER — CHLORHEXIDINE GLUCONATE 4 % EX LIQD: 60.0000 mL | Freq: Once | CUTANEOUS | Status: DC

## 2015-12-06 MED ORDER — HYDROMORPHONE HCL 1 MG/ML IJ SOLN
1.0000 mg | INTRAMUSCULAR | Status: DC | PRN
  Administered 2015-12-06 – 2015-12-07 (×3): 1 mg via INTRAVENOUS
  Filled 2015-12-06 (×3): qty 1

## 2015-12-06 MED ORDER — METOPROLOL SUCCINATE ER 25 MG PO TB24
25.0000 mg | ORAL_TABLET | Freq: Every day | ORAL | Status: DC
  Administered 2015-12-07 – 2015-12-11 (×5): 25 mg via ORAL
  Filled 2015-12-06 (×6): qty 1

## 2015-12-06 MED ORDER — ONDANSETRON HCL 4 MG PO TABS: 4.0000 mg | ORAL_TABLET | Freq: Four times a day (QID) | ORAL | Status: DC | PRN

## 2015-12-06 MED ORDER — INSULIN ASPART 100 UNIT/ML ~~LOC~~ SOLN
0.0000 [IU] | Freq: Three times a day (TID) | SUBCUTANEOUS | Status: DC
  Administered 2015-12-08: 2.95 [IU] via SUBCUTANEOUS
  Administered 2015-12-10 (×2): 4 [IU] via SUBCUTANEOUS
  Administered 2015-12-11 (×2): 5 [IU] via SUBCUTANEOUS

## 2015-12-06 MED ORDER — 0.9 % SODIUM CHLORIDE (POUR BTL) OPTIME
TOPICAL | Status: DC | PRN
  Administered 2015-12-06: 1000 mL

## 2015-12-06 MED ORDER — POLYETHYLENE GLYCOL 3350 17 G PO PACK
17.0000 g | PACK | Freq: Every day | ORAL | Status: DC | PRN
Start: 2015-12-06 — End: 2015-12-11

## 2015-12-06 MED ORDER — INSULIN PUMP
Freq: Every day | SUBCUTANEOUS | Status: DC
  Administered 2015-12-07 – 2015-12-09 (×3): via SUBCUTANEOUS
  Filled 2015-12-06: qty 1

## 2015-12-06 SURGICAL SUPPLY — 37 items
BLADE SAW RECIP 87.9 MT (BLADE) ×3 IMPLANT
BLADE SURG 21 STRL SS (BLADE) ×3 IMPLANT
BNDG COHESIVE 6X5 TAN STRL LF (GAUZE/BANDAGES/DRESSINGS) ×6 IMPLANT
BNDG GAUZE ELAST 4 BULKY (GAUZE/BANDAGES/DRESSINGS) ×6 IMPLANT
COVER SURGICAL LIGHT HANDLE (MISCELLANEOUS) ×6 IMPLANT
CUFF TOURNIQUET SINGLE 34IN LL (TOURNIQUET CUFF) IMPLANT
CUFF TOURNIQUET SINGLE 44IN (TOURNIQUET CUFF) IMPLANT
DRAPE EXTREMITY T 121X128X90 (DRAPE) ×3 IMPLANT
DRAPE INCISE IOBAN 66X45 STRL (DRAPES) IMPLANT
DRAPE PROXIMA HALF (DRAPES) ×3 IMPLANT
DRAPE U-SHAPE 47X51 STRL (DRAPES) ×3 IMPLANT
DRSG ADAPTIC 3X8 NADH LF (GAUZE/BANDAGES/DRESSINGS) ×3 IMPLANT
DRSG PAD ABDOMINAL 8X10 ST (GAUZE/BANDAGES/DRESSINGS) ×3 IMPLANT
DURAPREP 26ML APPLICATOR (WOUND CARE) ×3 IMPLANT
ELECT REM PT RETURN 9FT ADLT (ELECTROSURGICAL) ×3
ELECTRODE REM PT RTRN 9FT ADLT (ELECTROSURGICAL) ×1 IMPLANT
GAUZE SPONGE 4X4 12PLY STRL (GAUZE/BANDAGES/DRESSINGS) ×3 IMPLANT
GLOVE BIOGEL PI IND STRL 9 (GLOVE) ×1 IMPLANT
GLOVE BIOGEL PI INDICATOR 9 (GLOVE) ×2
GLOVE SURG ORTHO 9.0 STRL STRW (GLOVE) ×3 IMPLANT
GOWN STRL REUS W/ TWL XL LVL3 (GOWN DISPOSABLE) ×2 IMPLANT
GOWN STRL REUS W/TWL XL LVL3 (GOWN DISPOSABLE) ×6
KIT BASIN OR (CUSTOM PROCEDURE TRAY) ×3 IMPLANT
KIT ROOM TURNOVER OR (KITS) ×3 IMPLANT
MANIFOLD NEPTUNE II (INSTRUMENTS) ×3 IMPLANT
NS IRRIG 1000ML POUR BTL (IV SOLUTION) ×3 IMPLANT
PACK GENERAL/GYN (CUSTOM PROCEDURE TRAY) ×3 IMPLANT
PAD ARMBOARD 7.5X6 YLW CONV (MISCELLANEOUS) ×3 IMPLANT
PAD NEG PRESSURE SENSATRAC (MISCELLANEOUS) ×2 IMPLANT
PREVENA INCISION MGT 90 150 (MISCELLANEOUS) ×2 IMPLANT
SPONGE LAP 18X18 X RAY DECT (DISPOSABLE) IMPLANT
STAPLER VISISTAT 35W (STAPLE) IMPLANT
STOCKINETTE IMPERVIOUS LG (DRAPES) ×3 IMPLANT
SUT SILK 2 0 (SUTURE) ×3
SUT SILK 2-0 18XBRD TIE 12 (SUTURE) ×1 IMPLANT
SUT VIC AB 1 CTX 27 (SUTURE) IMPLANT
TOWEL OR 17X26 10 PK STRL BLUE (TOWEL DISPOSABLE) ×3 IMPLANT

## 2015-12-06 NOTE — Transfer of Care (Signed)
Immediate Anesthesia Transfer of Care Note  Patient: [name]  Procedure(s) Performed: Procedure(s): AMPUTATION BELOW KNEE (Left)  Patient Location: PACU  Anesthesia Type:General  Level of Consciousness: awake  Airway & Oxygen Therapy: Patient Spontanous Breathing and Patient connected to nasal cannula oxygen  Post-op Assessment: Report given to RN and Post -op Vital signs reviewed and stable  Post vital signs: Reviewed and stable  Last Vitals:  Filed Vitals:   12/06/15 1110  BP: 165/65  Pulse: 81  Temp: 36.4 C  Resp: 16    Complications: No apparent anesthesia complications

## 2015-12-06 NOTE — Anesthesia Preprocedure Evaluation (Addendum)
Anesthesia Evaluation  Patient identified by MRN, date of birth, ID band  Reviewed: Allergy & Precautions, NPO status , Patient's Chart, lab work & pertinent test results, reviewed documented beta blocker date and time   Airway Mallampati: II  TM Distance: >3 FB Neck ROM: Full    Dental  (+) Teeth Intact, Dental Advisory Given   Pulmonary COPD, Current Smoker,    breath sounds clear to auscultation + decreased breath sounds      Cardiovascular hypertension, Pt. on medications and Pt. on home beta blockers + CAD ('06 cath: mild-mod coronary irreg, EF 55-60%) and + Peripheral Vascular Disease   Rhythm:Regular Rate:Normal     Neuro/Psych Peripheral neuropathy    GI/Hepatic Neg liver ROS, GERD  Medicated and Controlled,  Endo/Other  diabetes (glu 115), Insulin Dependent  Renal/GU negative Renal ROS     Musculoskeletal   Abdominal   Peds  Hematology negative hematology ROS (+)   Anesthesia Other Findings   Reproductive/Obstetrics                        Anesthesia Physical Anesthesia Plan  ASA: III  Anesthesia Plan: General   Post-op Pain Management:    Induction: Intravenous  Airway Management Planned: LMA  Additional Equipment:   Intra-op Plan:   Post-operative Plan:   Informed Consent: I have reviewed the patients History and Physical, chart, labs and discussed the procedure including the risks, benefits and alternatives for the proposed anesthesia with the patient or authorized representative who has indicated his/her understanding and acceptance.   Dental advisory given  Plan Discussed with: CRNA and Surgeon  Anesthesia Plan Comments: (Plan routine monitors, GA- LMA OK)        Anesthesia Quick Evaluation

## 2015-12-06 NOTE — Progress Notes (Signed)
Status post left transtibial amputation. Anticipate discharge to skilled nursing facility.

## 2015-12-06 NOTE — Op Note (Signed)
   Date of Surgery: 12/06/2015  INDICATIONS: Mr. [name] is a 65 y.o.-year-old male who is status post revascularization to the left lower extremity. Patient presents at this time after failure of limb salvage for transtibial amputation.Marland Kitchen.  PREOPERATIVE DIAGNOSIS: Gangrenous changes left foot  POSTOPERATIVE DIAGNOSIS: Same.  PROCEDURE: Transtibial amputation left mid level Application of Prevena wound VAC  SURGEON: Lajoyce Cornersuda, M.D.  ANESTHESIA:  general  IV FLUIDS AND URINE: See anesthesia.  ESTIMATED BLOOD LOSS: Minimal mL.  COMPLICATIONS: None.  DESCRIPTION OF PROCEDURE: The patient was brought to the operating room and underwent a general anesthetic. After adequate levels of anesthesia were obtained patient's lower extremity was prepped using DuraPrep draped into a sterile field. A timeout was called. The foot was draped out of the sterile field with impervious stockinette. A transverse incision was made 11 cm distal to the tibial tubercle. This curved proximally and a large posterior flap was created. The tibia was transected 1 cm proximal to the skin incision. The fibula was transected just proximal to the tibial incision. The tibia was beveled anteriorly. A large posterior flap was created. The sciatic nerve was pulled cut and allowed to retract. The vascular bundles were suture ligated with 2-0 silk. The deep and superficial fascial layers were closed using #1 Vicryl. The skin was closed using staples and 2-0 nylon. The wound was covered with a Prevena wound VAC. There was a good suction fit. Patient was extubated taken to the PACU in stable condition.  [name] BakerMarcus Delma Drone, MD [facility] 1:28 PM

## 2015-12-06 NOTE — Progress Notes (Signed)
Diabetes Coordinator paged and informed of pt's admission and surgery.

## 2015-12-06 NOTE — Anesthesia Postprocedure Evaluation (Signed)
Anesthesia Post Note  Patient: [name]  Procedure(s) Performed: Procedure(s) (LRB): AMPUTATION BELOW KNEE (Left)  Patient location during evaluation: PACU Anesthesia Type: General Level of consciousness: awake and alert, patient cooperative and oriented Pain management: pain level controlled Vital Signs Assessment: post-procedure vital signs reviewed and stable Respiratory status: spontaneous breathing, nonlabored ventilation, respiratory function stable and patient connected to nasal cannula oxygen Cardiovascular status: blood pressure returned to baseline and stable Postop Assessment: no signs of nausea or vomiting Anesthetic complications: no    Last Vitals:  Filed Vitals:   12/06/15 1520 12/06/15 1522  BP: 188/74 159/70  Pulse: 78 79  Temp:  36.7 C  Resp: 10 14    Last Pain:  Filed Vitals:   12/06/15 1529  PainSc: Asleep                 Aletheia Tangredi,E. Lamees Gable

## 2015-12-06 NOTE — Anesthesia Procedure Notes (Signed)
Procedure Name: LMA Insertion Date/Time: 12/06/2015 12:52 PM Performed by: Alanda AmassFRIEDMAN, Jeron Grahn A Pre-anesthesia Checklist: Patient identified, Emergency Drugs available, Suction available, Patient being monitored and Timeout performed Patient Re-evaluated:Patient Re-evaluated prior to inductionOxygen Delivery Method: Circle System Utilized and Circle system utilized Preoxygenation: Pre-oxygenation with 100% oxygen Intubation Type: IV induction Ventilation: Mask ventilation without difficulty LMA: LMA inserted LMA Size: 4.0 Number of attempts: 1 Placement Confirmation: positive ETCO2 and breath sounds checked- equal and bilateral Tube secured with: Tape Dental Injury: Teeth and Oropharynx as per pre-operative assessment

## 2015-12-06 NOTE — H&P (Signed)
[name] is an 65 y.o. male.   Chief Complaint: Gangrenous changes left foot HPI: Patient is a 65 year old woman with diabetic insensate neuropathy peripheral vascular disease status post revascularization to left lower extremity who presents with progressive gangrenous changes to the left lower extremity.  Past Medical History  Diagnosis Date  . Coronary artery disease   . Peripheral vascular disease (HCC)   . Dyslipidemia   . Cigarette smoker   . Diabetes mellitus     type 1  x 50 yrs  . Diabetic retinopathy [facility](HCC)     Past Surgical History  Procedure Laterality Date  . Lower extremity angiogram N/A 01/23/2014    Procedure: LOWER EXTREMITY ANGIOGRAM;  Surgeon: Pamella PertJagadeesh R Ganji, MD;  [facility];  Service: Cardiovascular;  Laterality: N/A;  . Abdominal angiogram N/A 05/29/2014    Procedure: ABDOMINAL ANGIOGRAM;  Surgeon: Pamella PertJagadeesh R Ganji, MD;  [facility];  Service: Cardiovascular;  Laterality: N/A;  . Lower extremity angiogram N/A 07/31/2014    Procedure: LOWER EXTREMITY ANGIOGRAM;  Surgeon: Pamella PertJagadeesh R Ganji, MD;  [facility];  Service: Cardiovascular;  Laterality: N/A;  . Peripheral vascular catheterization Left 10/11/2015    Procedure: Peripheral Vascular Balloon Angioplasty;  Surgeon: Nada LibmanVance W Brabham, MD;  Location: [facility] INVASIVE CV LAB;  Service: Cardiovascular;  Laterality: Left;  sfa failed unable to cross occluded sfa  . Peripheral vascular catheterization N/A 10/11/2015    Procedure: Abdominal Aortogram;  Surgeon: Nada LibmanVance W Brabham, MD;  Location: [facility] INVASIVE CV LAB;  Service: Cardiovascular;  Laterality: N/A;  . Lower extremity angiogram Left 10/11/2015    Procedure: Lower Extremity Angiogram;  Surgeon: Nada LibmanVance W Brabham, MD;  [facility];  Service: Cardiovascular;  Laterality: Left;  . Cardiac catheterization      EF is 55-60% and no wall motion abnormalities (long long time ago)  . Fracture surgery      left arm "many yrs ago"   . Femoral-popliteal bypass graft Left 10/24/2015    Procedure: BYPASS GRAFT FEMORAL below knee POPLITEAL ARTERY with Left Saphenous Vein;  Surgeon: Nada LibmanVance W Brabham, MD;  [facility];  Service: Vascular;  Laterality: Left;  . Amputation Left 10/25/2015    Procedure: Left Foot 5th Ray Amputation;  Surgeon: Nadara MustardMarcus Brucha Ahlquist V, MD;  [facility];  Service: Orthopedics;  Laterality: Left;    Family History  Problem Relation Age of Onset  . Coronary artery disease Mother    Social History:  reports that he has been smoking Cigarettes.  He has a 15 pack-year smoking history. He has never used smokeless tobacco. He reports that he drinks alcohol. He reports that he does not use illicit drugs.  Allergies:  Allergies  Allergen Reactions  . Simvastatin Other (See Comments)    Leg aches, muscle weakness     No prescriptions prior to admission    No results found for this or any previous visit (from the past 48 hour(s)). No results found.  Review of Systems  All other systems reviewed and are negative.   There were no vitals taken for this visit. Physical Exam  On examination patient has Ringer's changes to the left foot. Assessment/Plan Assessment: Gangrene left foot status post foot salvage intervention with foot salvage surgery and revascularization to left lower extremity.  Plan: We'll plan for left transtibial amputation. Risk and benefits were discussed including risk of the wound not healing need for additional surgery. Patient states she understands wish to proceed at  this time.  Nadara Mustard, MD 12/06/2015, 6:52 AM

## 2015-12-07 LAB — GLUCOSE, CAPILLARY
GLUCOSE-CAPILLARY: 89 mg/dL (ref 65–99)
GLUCOSE-CAPILLARY: 77 mg/dL (ref 65–99)
GLUCOSE-CAPILLARY: 172 mg/dL — AB (ref 65–99)
Glucose-Capillary: 42 mg/dL — CL (ref 65–99)
Glucose-Capillary: 88 mg/dL (ref 65–99)
Glucose-Capillary: 162 mg/dL — ABNORMAL HIGH (ref 65–99)

## 2015-12-07 NOTE — Progress Notes (Signed)
Subjective: 1 Day Post-Op Procedure(s) (LRB): AMPUTATION BELOW KNEE (Left) Patient reports pain as moderate.    Objective: Vital signs in last 24 hours: Temp:  [97.1 F (36.2 C)-98.3 F (36.8 C)] 97.1 F (36.2 C) (04/15 0359) Pulse Rate:  [77-88] 88 (04/15 0359) Resp:  [7-23] 20 (04/15 0359) BP: (135-188)/(45-90) 137/45 mmHg (04/15 0359) SpO2:  [91 %-100 %] 95 % (04/15 0359)  Intake/Output from previous day: 04/14 0701 - 04/15 0700 In: 1300 [I.V.:1300] Out: 1450 [Urine:1350; Blood:100] Intake/Output this shift:     Recent Labs  12/06/15 1146 12/06/15 1308  HGB 11.9* 11.2*    Recent Labs  12/06/15 1146 12/06/15 1308  WBC 10.5  --   RBC 3.91*  --   HCT 36.4* 33.0*  PLT 486*  --     Recent Labs  12/06/15 1146 12/06/15 1308  NA 139 138  K 5.5* 4.4  CL 106  --   CO2 22  --   BUN 17  --   CREATININE 0.87  --   GLUCOSE 139* 144*  CALCIUM 9.2  --    No results for input(s): LABPT, INR in the last 72 hours.  dressing dry.   Assessment/Plan: 1 Day Post-Op Procedure(s) (LRB): AMPUTATION BELOW KNEE (Left) Plan:  Elevate stump above heart to decrease pain. PT.   Annell GreeningYATES,MARK C 12/07/2015, 1:58 PM

## 2015-12-07 NOTE — Progress Notes (Signed)
CBG 42. On assessment pt is alert and oriented. Pt in no apparent physical distress. Pt reports his blood sugars normally dip low when sleeping. Pt offered 2, 118cc cups of orange juice. CBG will be rechecked in 15 minutes.

## 2015-12-07 NOTE — Evaluation (Signed)
Physical Therapy Evaluation Patient Details Name: Loma BostonStephen L Heckendorn MRN: [id number] DOB: 02/11/1951 Today's Date: 12/07/2015   History of Present Illness  Patient is a 65 year old woman with diabetic insensate neuropathy peripheral vascular disease status post revascularization to left lower extremity who presents with progressive gangrenous changes to the left lower extremity. 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 PMH: CAD, DM, PVD, tobacco abuse  Clinical Impression  Patient is s/p left BKA resulting in functional limitations due to the deficits listed below (see PT Problem List). Pt transferred bed to chair and performed short distance ambulation with RW with min A. Pt very anxious and depressed about current situation.  Patient will benefit from skilled PT to increase their independence and safety with mobility to allow discharge to the venue listed below.       Follow Up Recommendations SNF    Equipment Recommendations  Wheelchair (measurements PT);3in1 (PT)    Recommendations for Other Services OT consult     Precautions / Restrictions Precautions Precautions: Fall Precaution Comments: left wound vac Restrictions Weight Bearing Restrictions: Yes LLE Weight Bearing: Non weight bearing      Mobility  Bed Mobility Overal bed mobility: Modified Independent             General bed mobility comments: pt able to get to EOB with increased time but no physical assist  Transfers Overall transfer level: Needs assistance Equipment used: None;Rolling walker (2 wheeled) Transfers: Sit to/from [employer] CorporationStand;Squat Pivot Transfers Sit to Stand: Min assist   Squat pivot transfers: Min assist     General transfer comment: pt very anxious with mobility. Min A to squat pivot to recliner without AD to simulate w/c transfer. Min A for sit to stand to RW, vc's for hand placement  Ambulation/Gait Ambulation/Gait assistance: Min assist Ambulation Distance (Feet): 7 Feet Assistive device: Rolling  walker (2 wheeled) Gait Pattern/deviations:  (hopping)     General Gait Details: 7' fwd, 3' bkwd. Pt able to hop with RW but impuslive and tends to slide right leg too far fwd under RW throwing balance bkwd, educated him about this  Information systems managertairs            Wheelchair Mobility    Modified Rankin (Stroke Patients Only)       Balance Overall balance assessment: Needs assistance Sitting-balance support: No upper extremity supported Sitting balance-Leahy Scale: Good     Standing balance support: Bilateral upper extremity supported Standing balance-Leahy Scale: Poor Standing balance comment: needs UE support with unilateral LE                             Pertinent Vitals/Pain Pain Assessment: 0-10 Pain Score: 8  Pain Location: LLE Pain Descriptors / Indicators: Burning Pain Intervention(s): Limited activity within patient's tolerance;Monitored during session;Patient requesting pain meds-RN notified;Repositioned    Home Living Family/patient expects to be discharged to:: Private residence Living Arrangements: Spouse/significant other Available Help at Discharge: Friend(s);Available PRN/intermittently Type of Home: House Home Access: Stairs to enter Entrance Stairs-Rails: Right Entrance Stairs-Number of Steps: 2 Home Layout: One level Home Equipment: Walker - 2 wheels Additional Comments: pt can stay with girlfriend upon d/c but she has a trade show coming up and will be gone for a week. 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 knee flexion tight due to  swelling, hip appears WFL  Cervical / Trunk Assessment: Kyphotic  Communication   Communication: HOH  Cognition Arousal/Alertness: Awake/alert Behavior During Therapy: Anxious;Impulsive Overall Cognitive Status: Within Functional Limits for tasks assessed                      General Comments General comments (skin integrity, edema, etc.): pt very depressed about current situation, mentioned several times he just doesn't know if he can do it. Educated on proper positioning, desensitization, and phantom pain mgmt    Exercises Amputee Exercises Quad Sets: AROM;Left;10 reps;Seated Gluteal Sets: AROM;Left;10 reps;Seated Hip Extension: AROM;Left;5 reps;Standing Hip ABduction/ADduction: AROM;Left;10 reps;Seated Knee Flexion: AROM;Left;5 reps;Seated Straight Leg Raises: AROM;Left;10 reps;Supine      Assessment/Plan    PT Assessment Patient needs continued PT services  PT Diagnosis Difficulty walking;Abnormality of gait;Acute pain   PT Problem List Decreased strength;Decreased range of motion;Decreased activity tolerance;Decreased balance;Decreased mobility;Decreased knowledge of use of DME;Decreased knowledge of precautions;Pain  PT Treatment Interventions DME instruction;Gait training;Functional mobility training;Therapeutic activities;Therapeutic exercise;Balance training;Patient/family education   PT Goals (Current goals can be found in the Care Plan section) Acute Rehab PT Goals 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 PT Goal Formulation: With patient Time For Goal Achievement: 12/21/15 Potential to Achieve Goals: Good    Frequency Min 3X/week   Barriers to discharge Decreased caregiver support girlfriend will be gone, pt will be alone    Co-evaluation               End of Session Equipment Utilized During Treatment: Gait belt Activity Tolerance: Patient tolerated treatment well Patient left: in chair;with call bell/phone within  reach;with family/visitor present Nurse Communication: Mobility status         Time: 1002-1031 PT Time Calculation (min) (ACUTE ONLY): 29 min   Charges:   PT Evaluation $PT Eval Moderate Complexity: 1 Procedure PT Treatments $Gait Training: 8-22 mins   PT G Codes:       Lyanne Co, PT  Acute Rehab Services  [phone]  Lyanne Co 12/07/2015, 11:57 AM

## 2015-12-07 NOTE — Clinical Social Work Note (Addendum)
Clinical Social Work Assessment  Patient Details  Name: [name] MRN: [id number] Date of Birth: [birth year]  Date of referral:  12/07/15               Reason for consult:  Facility Placement, Discharge Planning                Permission sought to share information with:  Family Supports, Customer service manager, Case Optician, dispensing granted to share information::  Yes, Verbal Permission Granted  Name::      [name] )  Agency::   (SNF's )  Relationship::   (Friend )  Contact Information:   [phone])  Housing/Transportation Living arrangements for the past 2 months:  [place] of Information:  Patient Patient Interpreter Needed:  None Criminal Activity/Legal Involvement Pertinent to Current Situation/Hospitalization:  No - Comment as needed Significant Relationships:  Friend, Parents Lives with:  Significant Other Do you feel safe going back to the place where you live?  No Need for family participation in patient care:  No (Coment)  Care giving concerns: Patient with BKA and requiring short-term rehab at skilled level.   Social Worker assessment / plan:  Holiday representative met with patient at bedside in reference to post-acute placement for SNF. CSW introduced CSW role and SNF process. Pt stated that he is agreeable to SNF placement however not familiar with SNF's in the area. Patient stated he is from home with friend, [name] however she travels mostly during the week and will be out of town this upcoming Monday-Friday. Pt stated although he is agreeable to SNF he is hopeful for a speedy recovery. CSW also explained BCBS authorization process. FL-2 completed and faxed via [place]. No further concerns reported by patient at this time. CSW will continue to follow pt for continued support and to facilitate d/c needs once medically stable.   Employment status:  Therapist, music:  Managed Care PT Recommendations:  [place] / Referral to community resources:  [place]  Patient/Family's Response to care:  Patient alert and oriented x4. Pt agreeable to SNF placement and does not have a preference at this time. Pt pleasant and appreciated social work intervention.   Patient/Family's Understanding of and Emotional Response to Diagnosis, Current Treatment, and Prognosis:  Pt knowledgeable of medical/surgical interventions.   Emotional Assessment Appearance:  Appears stated age Attitude/Demeanor/Rapport:   (Pleasant ) Affect (typically observed):  Accepting, Appropriate, Pleasant Orientation:  Oriented to Situation, Oriented to  Time, Oriented to Place, Oriented to Self Alcohol / Substance use:  Not Applicable Psych involvement (Current and /or in the community):  No (Comment)  Discharge Needs  Concerns to be addressed:  Care Coordination Readmission within the last 30 days:  No Current discharge risk:  Dependent with Mobility Barriers to Discharge:  Ship broker, Continued Medical Work up   [employer], MSW, LCSWA [phone] 12/07/2015 3:06 PM

## 2015-12-07 NOTE — Clinical Social Work Placement (Addendum)
   CLINICAL SOCIAL WORK PLACEMENT  NOTE  Date:  12/07/2015  Patient Details  Name: [name] MRN: [id number] Date of Birth: [birth year]  Clinical Social Work is seeking post-discharge placement for this patient at the Skilled  Nursing Facility level of care (*CSW will initial, date and re-position this form in  chart as items are completed):  Yes   Patient/family provided with [facility] Clinical Social Work Department's list of facilities offering this level of care within the geographic area requested by the patient (or if unable, by the patient's family).  Yes   Patient/family informed of their freedom to choose among providers that offer the needed level of care, that participate in Medicare, Medicaid or managed care program needed by the patient, have an available bed and are willing to accept the patient.  Yes   Patient/family informed of [facility]'s ownership interest in [facility] and [facility], as well as of the fact that they are under no obligation to receive care at these facilities.  PASRR submitted to EDS on 12/07/15     PASRR number received on 12/07/15     Existing PASRR number confirmed on       FL2 transmitted to all facilities in geographic area requested by pt/family on 12/07/15     FL2 transmitted to all facilities within larger geographic area on       Patient informed that his/her managed care company has contracts with or will negotiate with certain facilities, including the following:         12-09-15  Patient/family informed of bed offers received. (updated 12-11-15 Windell MouldingEric Cletus Mehlhoff MSW, LCSWA)   [facility] at  [facility] (updated 12-11-15 Windell MouldingEric Lissette Schenk MSW, [facility])      Physician recommends and patient chooses bed at      [facility]  [facility]15 (updated 12-11-15 Windell MouldingEric Thessaly Mccullers MSW, LCSWA) .  Patient to be transferred to facility by  Patient's family's personal car (updated 12-11-15 Windell MouldingEric Tramaine Snell  MSW, LCSWA)      Patient family notified on  12-11-15 of transfer (updated 12-11-15 Windell MouldingEric Darbi Chandran MSW, LCSWA) .  Name of family member notified:   Patient notified his family (updated 12-11-15 Windell MouldingEric Jamea Robicheaux MSW, LCSWA)      PHYSICIAN Please sign FL2     Additional Comment:    _______________________________________________ Vaughan BrownerNixon, Bashira A, LCSW 12/07/2015, 3:06 PM   Ervin KnackEric R. Hassan Rowannterhaus, MSW, Theresia MajorsLCSWA [phone] 12/11/2015 1:57 PM (updated 12-11-15 Windell MouldingEric Albirtha Grinage MSW, LCSWA)

## 2015-12-07 NOTE — [place] FL2 (Signed)
[place] MEDICAID FL2 LEVEL OF CARE SCREENING TOOL     IDENTIFICATION  Patient Name: [name] Birthdate: 05-Dec-1950 Sex: male Admission Date (Current Location): 12/06/2015  [facility] and [place] Number:  Producer, television/film/video and Address:  The . [facility], [address] [place], [place] [address]      Provider Number: [id number]  Attending Physician Name and Address:  Nadara Mustard, MD  Relative Name and Phone Number:       Current Level of Care: Hospital Recommended Level of Care: Skilled Nursing Facility Prior Approval Number:    Date Approved/Denied:   PASRR Number:  [id number]  Discharge Plan: SNF    Current Diagnoses: Patient Active Problem List   Diagnosis Date Noted  . Below knee amputation status (HCC) 12/06/2015  . PAD (peripheral artery disease) (HCC) 10/24/2015  . Diabetes mellitus with peripheral artery disease (HCC) 07/30/2014  . Claudication in peripheral vascular disease (HCC) 07/30/2014  . IDDM (insulin dependent diabetes mellitus) (HCC) 01/23/2013  . CAD (coronary artery disease) 04/15/2011  . Hyperlipidemia 04/15/2011    Orientation RESPIRATION BLADDER Height & Weight     Self, Time, Situation, Place  Normal Continent Weight: 162 lb (73.483 kg) Height:     BEHAVIORAL SYMPTOMS/MOOD NEUROLOGICAL BOWEL NUTRITION STATUS      Continent Diet  AMBULATORY STATUS COMMUNICATION OF NEEDS Skin   Extensive Assist Verbally Normal                       Personal Care Assistance Level of Assistance  Bathing, Dressing, Feeding Bathing Assistance: Maximum assistance Feeding assistance: Independent Dressing Assistance: Maximum assistance     Functional Limitations Info  Sight, Hearing, Speech Sight Info: Adequate Hearing Info: Adequate Speech Info: Adequate    SPECIAL CARE FACTORS FREQUENCY  PT (By licensed PT), OT (By licensed OT)                    Contractures      Additional Factors Info  Code Status,  Allergies Code Status Info: Prior Allergies Info: Simvastatin           Current Medications (12/07/2015):  This is the current hospital active medication list Current Facility-Administered Medications  Medication Dose Route Frequency Provider Last Rate Last Dose  . 0.9 %  sodium chloride infusion   Intravenous Continuous Nadara Mustard, MD 10 mL/hr at 12/06/15 1626    . acetaminophen (TYLENOL) tablet 650 mg  650 mg Oral Q6H PRN Nadara Mustard, MD       Or  . acetaminophen (TYLENOL) suppository 650 mg  650 mg Rectal Q6H PRN Nadara Mustard, MD      . aspirin EC tablet 325 mg  325 mg Oral Daily Nadara Mustard, MD   325 mg at 12/07/15 0951  . baclofen (LIORESAL) tablet 20 mg  20 mg Oral Daily PRN Nadara Mustard, MD      . bisacodyl (DULCOLAX) suppository 10 mg  10 mg Rectal Daily PRN Aldean Baker V, MD      . cilostazol (PLETAL) tablet 100 mg  100 mg Oral QHS Nadara Mustard, MD   100 mg at 12/06/15 2147  . docusate sodium (COLACE) capsule 100 mg  100 mg Oral BID Nadara Mustard, MD   100 mg at 12/07/15 0951  . folic acid (FOLVITE) tablet 1.5 mg  1,500 mcg Oral Daily Nadara Mustard, MD   1.5 mg at 12/07/15 0951  .  HYDROmorphone (DILAUDID) injection 1 mg  1 mg Intravenous Q2H PRN Nadara MustardMarcus Duda V, MD   1 mg at 12/07/15 1027  . insulin aspart (novoLOG) injection 0-15 Units  0-15 Units Subcutaneous TID WC Nadara MustardMarcus Duda V, MD   0 Units at 12/06/15 1700  . insulin pump   Subcutaneous Daily Aldean BakerMarcus Duda V, MD      . methocarbamol (ROBAXIN) tablet 500 mg  500 mg Oral Q6H PRN Nadara MustardMarcus Duda V, MD   500 mg at 12/06/15 1414   Or  . methocarbamol (ROBAXIN) 500 mg in dextrose 5 % 50 mL IVPB  500 mg Intravenous Q6H PRN Nadara MustardMarcus Duda V, MD      . metoCLOPramide (REGLAN) tablet 5-10 mg  5-10 mg Oral Q8H PRN Nadara MustardMarcus Duda V, MD       Or  . metoCLOPramide (REGLAN) injection 5-10 mg  5-10 mg Intravenous Q8H PRN Nadara MustardMarcus Duda V, MD   5 mg at 12/06/15 2044  . metoprolol succinate (TOPROL-XL) 24 hr tablet 25 mg  25 mg Oral Daily Nadara MustardMarcus  Duda V, MD   25 mg at 12/07/15 0951  . ondansetron (ZOFRAN) tablet 4 mg  4 mg Oral Q6H PRN Nadara MustardMarcus Duda V, MD       Or  . ondansetron [facility](ZOFRAN) injection 4 mg  4 mg Intravenous Q6H PRN Nadara MustardMarcus Duda V, MD   4 mg at 12/06/15 1710  . oxyCODONE (Oxy IR/ROXICODONE) immediate release tablet 5-10 mg  5-10 mg Oral Q3H PRN Nadara MustardMarcus Duda V, MD   10 mg at 12/07/15 0731  . pantoprazole (PROTONIX) EC tablet 40 mg  40 mg Oral Daily Nadara MustardMarcus Duda V, MD   40 mg at 12/07/15 0951  . polyethylene glycol (MIRALAX / GLYCOLAX) packet 17 g  17 g Oral Daily PRN Nadara MustardMarcus Duda V, MD      . pravastatin (PRAVACHOL) tablet 40 mg  40 mg Oral QHS Nadara MustardMarcus Duda V, MD   40 mg at 12/06/15 2148  . ramipril (ALTACE) capsule 10 mg  10 mg Oral Daily Nadara MustardMarcus Duda V, MD   10 mg at 12/07/15 [id number]     Discharge Medications: Please see discharge summary for a list of discharge medications.  Relevant Imaging Results:  Relevant Lab Results:   Additional Information SS#: [id number]  Grace BushyJoyce S Smyre, LCSW   Derenda FennelBashira Fernando Stoiber, MSW, LCSWA [phone] 12/07/2015 4:00 PM

## 2015-12-08 LAB — GLUCOSE, CAPILLARY
GLUCOSE-CAPILLARY: 159 mg/dL — AB (ref 65–99)
GLUCOSE-CAPILLARY: 226 mg/dL — AB (ref 65–99)
GLUCOSE-CAPILLARY: 354 mg/dL — AB (ref 65–99)
Glucose-Capillary: 267 mg/dL — ABNORMAL HIGH (ref 65–99)

## 2015-12-08 NOTE — Clinical Social Work Note (Signed)
Clinical Social Worker continuing to follow patient and family for support and discharge planning needs.  Patient provided with facility bed availability at bedside - patient plans to communicate with family and update weekday CSW of facility option.  Patient unsure at this time if he will transport by personal car or ambulance.  CSW remains available for support and to facilitate patient discharge needs once medically stable.  Macario GoldsJesse Tymeer Vaquera, LCSW (Weekend Only) [phone]

## 2015-12-08 NOTE — Progress Notes (Signed)
Subjective: 2 Days Post-Op Procedure(s) (LRB): AMPUTATION BELOW KNEE (Left) Patient reports pain as moderate.    Objective: Vital signs in last 24 hours: Temp:  [98.1 F (36.7 C)-99 F (37.2 C)] 98.2 F (36.8 C) (04/16 0445) Pulse Rate:  [73-82] 73 (04/16 0445) Resp:  [16-20] 16 (04/16 0445) BP: (134-144)/(53-67) 134/54 mmHg (04/16 0445) SpO2:  [95 %-99 %] 95 % (04/16 0445)  Intake/Output from previous day: 04/15 0701 - 04/16 0700 In: -  Out: 725 [Urine:725] Intake/Output this shift:     Recent Labs  12/06/15 1146 12/06/15 1308  HGB 11.9* 11.2*    Recent Labs  12/06/15 1146 12/06/15 1308  WBC 10.5  --   RBC 3.91*  --   HCT 36.4* 33.0*  PLT 486*  --     Recent Labs  12/06/15 1146 12/06/15 1308  NA 139 138  K 5.5* 4.4  CL 106  --   CO2 22  --   BUN 17  --   CREATININE 0.87  --   GLUCOSE 139* 144*  CALCIUM 9.2  --    No results for input(s): LABPT, INR in the last 72 hours.  VAC working no leak  Assessment/Plan: 2 Days Post-Op Procedure(s) (LRB): AMPUTATION BELOW KNEE (Left) Up with therapy  Decoda Van C 12/08/2015, 10:55 AM

## 2015-12-09 LAB — GLUCOSE, CAPILLARY
GLUCOSE-CAPILLARY: 273 mg/dL — AB (ref 65–99)
Glucose-Capillary: 77 mg/dL (ref 65–99)
Glucose-Capillary: 243 mg/dL — ABNORMAL HIGH (ref 65–99)
Glucose-Capillary: 309 mg/dL — ABNORMAL HIGH (ref 65–99)

## 2015-12-09 MED ORDER — INSULIN PUMP
Freq: Three times a day (TID) | SUBCUTANEOUS | Status: DC
  Administered 2015-12-09 – 2015-12-10 (×4): via SUBCUTANEOUS
  Administered 2015-12-10: 1 via SUBCUTANEOUS
  Administered 2015-12-11: 08:00:00 via SUBCUTANEOUS
  Filled 2015-12-09: qty 1

## 2015-12-09 NOTE — Progress Notes (Addendum)
Inpatient Diabetes Program Recommendations  AACE/ADA: New Consensus Statement on Inpatient Glycemic Control (2015)  Target Ranges:  Prepandial:   less than 140 mg/dL      Peak postprandial:   less than 180 mg/dL (1-2 hours)      Critically ill patients:  140 - 180 mg/dL   Results for [name], [name] (MRN [id number]) as of 12/09/2015 14:51  Ref. Range 12/08/2015 06:36 12/08/2015 11:23 12/08/2015 16:37 12/08/2015 22:06  Glucose-Capillary Latest Ref Range: 65-99 mg/dL 409267 (H) 811159 (H) 914226 (H) 354 (H)   Results for [name], [name] (MRN [id number]) as of 12/09/2015 14:51  Ref. Range 12/09/2015 06:08 12/09/2015 11:18  Glucose-Capillary Latest Ref Range: 65-99 mg/dL 086273 (H) 77    Admit with: Transtibial Amputation  History: DM  Home DM Meds: Insulin Pump  Current Insulin Orders: Insulin Pump     -Spoke with patient today about his insulin pump.  Patient has [facility].  Currently wearing and using insulin pump.  Changed set/site yesterday.  -CBGs labile but patient covering his CBGs with insulin from his pump.  -Patient Alert and Oriented and appropriate to use insulin pump in hospital.    --Insulin Pump Settings--  Basal Rates:  12am: 1.25 units/hr 5am- 1.3 units/hr 8:30am- 0.9 units/hr 4pm- 0.725 units/hr 7pm- 1.0 units/hr  Total Basal Insulin per 24 hours period= 24.73 units  Carbohydrate Ratio: 1 unit for every 15 Grams of Carbohydrates  Correction/Sensitivity Factor: 1 unit for every 50 mg/dl above Target CBG  Target CBG: 120 mg/dl   Patient's PCP: Dr. Timothy Lassousso [facility])    --Will follow patient during hospitalization--  Ambrose FinlandJeannine Johnston Jamarius Saha RN, MSN, CDE Diabetes Coordinator Inpatient Glycemic Control Team Team Pager: [phone] (8a-5p)

## 2015-12-09 NOTE — Clinical Social Work Note (Signed)
CSW spoke with patient and his significant other, and they agreed to [facility].  [facility] on Tuesday if he is medically ready for discharge, insurance authorization, and orders have been received.  [facility] will start insurance approval.  CSW to continue to follow patient's progress.  Ervin KnackEric R. Haya Hemler, MSW, Theresia MajorsLCSWA [phone] 12/09/2015 11:17 AM

## 2015-12-09 NOTE — Progress Notes (Signed)
Pt continues to use his insulin pump parameters to treat his blood sugars, however his CBG's remain elevated and the amount of insulin given by the pump is much less than the parameters for our SSI. Tubing is intact and there is a new site for the needle in the left abdomen.

## 2015-12-09 NOTE — Progress Notes (Signed)
Physical Therapy Treatment Patient Details Name: [name] MRN: [id number] DOB: [birth year] Today's Date: 12/09/2015    History of Present Illness Patient is a 65 year old woman with diabetic insensate neuropathy peripheral vascular disease status post revascularization to left lower extremity who presents with progressive gangrenous changes to the left lower extremity. 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 PMH: CAD, DM, PVD, tobacco abuse    PT Comments    Patient is progressing toward mobility goals. Current plan remains appropriate.   Follow Up Recommendations  SNF     Equipment Recommendations  Wheelchair (measurements PT);3in1 (PT)    Recommendations for Other Services OT consult     Precautions / Restrictions Precautions Precautions: Fall Precaution Comments: left wound vac Restrictions Weight Bearing Restrictions: Yes LLE Weight Bearing: Non weight bearing    Mobility  Bed Mobility               General bed mobility comments: pt OOB in chair upon arrival  Transfers Overall transfer level: Needs assistance Equipment used: Rolling walker (2 wheeled) Transfers: Sit to/from Stand Sit to Stand: Min guard         General transfer comment: min guard for safety; cues for hand placement; no unsteadiness and good safety awareness  Ambulation/Gait Ambulation/Gait assistance: Min guard Ambulation Distance (Feet): 18 Feet Assistive device: Rolling walker (2 wheeled) Gait Pattern/deviations: Step-to pattern     General Gait Details: ones standing rest break; pt c/o phantom pain upon stand that subsided quickly; cues for step length and energy conservation techniques   Stairs            Wheelchair Mobility    Modified Rankin (Stroke Patients Only)       Balance     Sitting balance-Leahy Scale: Good       Standing balance-Leahy Scale: Poor                      Cognition Arousal/Alertness: Awake/alert Behavior During Therapy: WFL  for tasks assessed/performed Overall Cognitive Status: Within Functional Limits for tasks assessed                      Exercises Amputee Exercises Quad Sets: AROM;Left;Seated;20 reps Hip Extension: AROM;Left;Standing;20 reps Hip ABduction/ADduction: AROM;Left;Seated;20 reps Hip Flexion/Marching: AROM;Left;20 reps;Standing Knee Flexion: AROM;Left;Seated;20 reps Straight Leg Raises: AROM;Left;20 reps;Seated    General Comments General comments (skin integrity, edema, etc.): educated pt on positioning       Pertinent Vitals/Pain Pain Assessment: Faces Faces Pain Scale: Hurts a little bit Pain Location: L LE Pain Descriptors / Indicators: Sore;Discomfort Pain Intervention(s): Limited activity within patient's tolerance;Monitored during session;Premedicated before session;Repositioned    Home Living                      Prior Function            PT Goals (current goals can now be found in the care plan section) Acute Rehab PT Goals 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 PT Goal Formulation: With patient Time For Goal Achievement: 12/21/15 Potential to Achieve Goals: Good Progress towards PT goals: Progressing toward goals    Frequency  Min 3X/week    PT Plan Current plan remains appropriate    Co-evaluation             End of Session Equipment Utilized During Treatment: Gait belt Activity Tolerance: Patient tolerated treatment well Patient left: in chair;with call bell/phone within reach  Time: [id number] PT Time Calculation (min) (ACUTE ONLY): 21 min  Charges:  $Gait Training: 8-22 mins                    G Codes:      Derek MoundKellyn R Elana Jian Marbin Olshefski, PTA Pager: [phone]   12/09/2015, 4:36 PM

## 2015-12-09 NOTE — Progress Notes (Signed)
Patient ID: [name], male   DOB: 08-24-1951, 65 y.o.   MRN: [id number] Bed available [facility] on Tuesday. Dr. Lajoyce Cornersuda to see in AM. AM CBG 77. Has been 200 plus.

## 2015-12-10 ENCOUNTER — Encounter (HOSPITAL_COMMUNITY): Payer: Self-pay | Admitting: Orthopedic Surgery

## 2015-12-10 LAB — GLUCOSE, CAPILLARY
GLUCOSE-CAPILLARY: 113 mg/dL — AB (ref 65–99)
GLUCOSE-CAPILLARY: 316 mg/dL — AB (ref 65–99)
Glucose-Capillary: 187 mg/dL — ABNORMAL HIGH (ref 65–99)
Glucose-Capillary: 291 mg/dL — ABNORMAL HIGH (ref 65–99)

## 2015-12-10 MED ORDER — OXYCODONE-ACETAMINOPHEN 5-325 MG PO TABS: 1.0000 | ORAL_TABLET | ORAL | Status: DC | PRN

## 2015-12-10 NOTE — Clinical Social Work Note (Signed)
Patient will discharge to SNF for short-term rehab and [facility]. CSW contacted by Jasmine DecemberSharon, admissions director with [facility] regarding [facility]'s insulin pump as they do not accept insulin pumps. Patient's bedside nurse Cierra informed and she also talked with admissions director and nursing director at SNF regarding this issue. CSW informed [facility] that authorization rec'd, however she will contact insurance rep. to advise her that patient will not discharge to in order to work out insulin administration.    CSW visited with nurse Chyrel Massonierra and provided update. CSW informed that insulin sliding scale dosage worked out, however they will need to begin sliding scale doses before patient leaves hospital. Nurse also informed that Dr. Lajoyce Cornersuda will need to be contacted to update his discharge summary with sliding scale insulin dosages.  On Wednesday patient will need to be seen by physical therapist (updated PT note will have to be submitted to insurance company) and revised insulin dosage information must be incorporated in patient's discharge summary. CSW will follow-up with nurse on Wednesday regarding d/c summary update and [facility].  Genelle BalVanessa Tytionna Cloyd, MSW, LCSW Licensed Clinical Social Worker Clinical Social Work Department [facility] [phone]

## 2015-12-10 NOTE — Progress Notes (Signed)
Mr [name] is a type 1 DM and been on Insulin pump for years.  He manages his own DM c A1C 7.7 - 8.7.  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  He is s/P transtibial Amputation and needs rehab.  [facility] They have a rule that he needs to not wear his pump while there and go to shots.  This presents a difficulty as he can be better and more safely managed per his pump.  He is Alert and oriented and more than capable of managing his own DM1.  He is even more capable of maintaining a better A1C  His Settings are 12am 1.25;  5 am 1.20;  8:30 0.9;  1600 0.725;  1900 1.00.   He has a B setting c slightly higher Basals but we could use the first one safely His Boluses are calculated at CHO:I 1:15 Correction Factor of 50 to target 120 +/- 10 Check CBGs 5 times per day and bolus accordingly  I prefer when he is at SNF to be allowed to manage own DMs or have CBGs checked per protocol and he put the CBG #s into his pump and allow it to work.  Any lows can be treated c food/Drink/Suspend/Glucagon etc  If no room to do this the only option would be Lantus 30 units and follow above rules for Bolusing  I will try and call [facility] to talk directly to the doctor who will be of record and see if we can get an exception as it seems silly not to let him do his pump.  [facility] # [phone]

## 2015-12-10 NOTE — Progress Notes (Signed)
PLEASE SEE DIABETES COORDINATOR RECOMMENDATION ON INSULIN REGIMEN OFF INSULIN PUMP. VERY IMPORTANT TO ORDER AT DISCHARGE. THANKS

## 2015-12-10 NOTE — Discharge Summary (Signed)
Physician Discharge Summary  Patient ID: [name] BostonStephen L Prien MRN: [id number] DOB/AGE: 09/04/1950 65 y.o.  Admit date: 12/06/2015 Discharge date: 12/10/2015  Admission Diagnoses:Gangrene left foot  Discharge Diagnoses:  Active Problems:   Below knee amputation status [facility](HCC)   Discharged Condition: stable  Hospital Course: Patient's hospital course was essentially unremarkable. He underwent a transtibial amputation of the left. Postoperatively he progressed slowly with therapy and was discharged to skilled nursing facility.  Consults: None  Significant Diagnostic Studies: labs: Routine labs  Treatments: surgery: See operative note  Discharge Exam: Blood pressure 122/54, pulse 73, temperature 98.3 F (36.8 C), temperature source Axillary, resp. rate 14, height 5\' 11"  (1.803 m), weight 73 kg (160 lb 15 oz), SpO2 97 %. Incision/Wound: Wound VAC functioning well  Disposition: 06-Home-Health Care Svc     Medication List    ASK your doctor about these medications        acetaminophen 500 MG tablet  Commonly known as:  TYLENOL  Take 1,000 mg by mouth 2 (two) times daily.     aspirin EC 81 MG tablet  Take 81 mg by mouth daily.     baclofen 20 MG tablet  Commonly known as:  LIORESAL  Take 20 mg by mouth daily as needed (for leg cramps). Reported on 12/05/2015     cilostazol 100 MG tablet  Commonly known as:  PLETAL  Take 1 tablet (100 mg total) by mouth 2 (two) times daily.     doxycycline 100 MG tablet  Commonly known as:  VIBRA-TABS  Take 100 mg by mouth 2 (two) times daily. For 28 days     folic acid 800 MCG tablet  Commonly known as:  FOLVITE  Take 1,600 mcg by mouth daily.     insulin aspart 100 UNIT/ML injection  Commonly known as:  novoLOG  Inject into the skin. Patient uses in Insulin Pump.     insulin pump Soln  Inject into the skin daily. Novolog     metoprolol succinate 25 MG 24 hr tablet  Commonly known as:  TOPROL-XL  Take 1 tablet (25 mg total) by  mouth daily.     MULTIVITAMIN GUMMIES ADULT PO  Take 1 tablet by mouth daily.     mupirocin ointment 2 %  Commonly known as:  BACTROBAN  Apply 1 application topically 2 (two) times daily.     nitroGLYCERIN 0.2 mg/hr patch  Commonly known as:  NITRODUR - Dosed in mg/24 hr  Place 0.2 mg onto the skin daily.     omeprazole 20 MG capsule  Commonly known as:  PRILOSEC  Take 1 capsule (20 mg total) by mouth 2 (two) times daily before a meal. BID for 7 days than daily     pravastatin 40 MG tablet  Commonly known as:  PRAVACHOL  Take 40 mg by mouth at bedtime.     ramipril 10 MG tablet  Commonly known as:  ALTACE  Take 10 mg by mouth daily.           Follow-up Information    Follow up with DUDA,MARCUS V, MD In 1 week.   Specialty:  Orthopedic Surgery   Contact information:   [address] [place] [address] [phone]       Signed: Nadara MustardDUDA,MARCUS V 12/10/2015, 11:54 AM

## 2015-12-10 NOTE — Progress Notes (Signed)
Inpatient Diabetes Program Recommendations  AACE/ADA: New Consensus Statement on Inpatient Glycemic Control (2015)  Target Ranges:  Prepandial:   less than 140 mg/dL      Peak postprandial:   less than 180 mg/dL (1-2 hours)      Critically ill patients:  140 - 180 mg/dL   When patient is d/c'd to SNF, he will need to be transitioned to SQ insulin due to SNF not accepting insulin pump. Based on insulin pump settings patient will need to be transitioned from Pump 3 hours before he leaves for SNF.  Patient is a Type 1 DM and will need basal, correction, and meal coverage.  Based from pump settings, MD please order: Levemir 25 units Q24hrs Novolog Sensitive Correction TID + HS scale Novolog 4 units TID meal coverage if patient eats at least 50% of meals.  Thanks,  Christena DeemShannon Glen Kesinger RN, MSN, [facility] Inpatient Diabetes Coordinator Team Pager [phone] (8a-5p)

## 2015-12-11 LAB — GLUCOSE, CAPILLARY
GLUCOSE-CAPILLARY: 366 mg/dL — AB (ref 65–99)
GLUCOSE-CAPILLARY: 245 mg/dL — AB (ref 65–99)

## 2015-12-11 NOTE — Progress Notes (Signed)
Physical Therapy Treatment Patient Details Name: [name] MRN: [id number] DOB: February 02, 1951 Today's Date: 12/11/2015    History of Present Illness Patient is a 65 year old man with diabetic insensate neuropathy peripheral vascular disease status post revascularization to left lower extremity who presents with progressive gangrenous changes to the left lower extremity. 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 PMH: CAD, DM, PVD, tobacco abuse    PT Comments    Pt presents with flat affect.  Pt remains to require cues to improve ease of transfer and maintain safety.  Pt reports fatigue and weakness in B shoulders that limits gait distance progression.  Pt will remain to require Short term rehab to improve strength, endurance and promote functional independence to return home safely with decreased fall risk.  Will continue POC acutely during hospitalization.     Follow Up Recommendations  SNF     Equipment Recommendations  Wheelchair (measurements PT);3in1 (PT)    Recommendations for Other Services       Precautions / Restrictions Precautions Precautions: Fall Precaution Comments: left wound vac Restrictions Weight Bearing Restrictions: Yes LLE Weight Bearing: Non weight bearing    Mobility  Bed Mobility               General bed mobility comments: pt OOB in chair upon arrival  Transfers Overall transfer level: Needs assistance Equipment used: Rolling walker (2 wheeled) Transfers: Sit to/from Stand Sit to Stand: Min guard         General transfer comment: min guard for safety and stabilization for balance; cues for hand placement; mild unsteadiness with good righting response.  Ambulation/Gait Ambulation/Gait assistance: Min guard Ambulation Distance (Feet): 20 Feet (x2 trials.  pt required standing break inbetween trials.  Heavy reliance on B UEs.  ) Assistive device: Rolling walker (2 wheeled) Gait Pattern/deviations: Step-to pattern;Trunk flexed   Gait velocity  interpretation: <1.8 ft/sec, indicative of risk for recurrent falls General Gait Details: Pt remains to require standing break.  Pt required cues for postural awareness to avoid heavy reliance on RW and decrease discomfort in B shoulders as patient fatigues.  Pt required cues for safety with turns and backing to reduce risk of falls.     Stairs            Wheelchair Mobility    Modified Rankin (Stroke Patients Only)       Balance   Sitting-balance support: Bilateral upper extremity supported Sitting balance-Leahy Scale: Good       Standing balance-Leahy Scale: Poor Standing balance comment: heavy reliance on RW with unilateral LE support.                      Cognition Arousal/Alertness: Awake/alert Behavior During Therapy: WFL for tasks assessed/performed Overall Cognitive Status: Within Functional Limits for tasks assessed                      Exercises General Exercises - Lower Extremity Ankle Circles/Pumps: AROM;Right;20 reps Long Arc Quad: AROM;Both;10 reps (with blue theraband on RLE, pt also performed 1x10 seated HS curls on R with blue theraband.  Marland Kitchen  ) Hip ABduction/ADduction: AROM;Both;10 reps (with blue theraband) Hip Flexion/Marching: AROM;Both;10 reps (blue theraband to improve resistance and challenge activity.  ) Amputee Exercises Hip Extension: AROM;Left;Standing;10 reps Chair Push Up: AROM;Left;10 reps    General Comments General comments (skin integrity, edema, etc.): Pt educated on prone positioning to avoid hip flexion contracture and cues for knee extension in resting position to avoid knee  flexion contracture.        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 Potential to Achieve Goals:  Good Progress towards PT goals: Progressing toward goals    Frequency  Min 3X/week    PT Plan Current plan remains appropriate    Co-evaluation             End of Session Equipment Utilized During Treatment: Gait belt Activity Tolerance: Patient tolerated treatment well Patient left: in chair;with call bell/phone within reach     Time: [id number] PT Time Calculation (min) (ACUTE ONLY): 29 min  Charges:  $Gait Training: 8-22 mins $Therapeutic Exercise: 8-22 mins                    G Codes:      Florestine Aversimee J Devere Brem 12/11/2015, 11:37 AM  Joycelyn RuaAimee Heidee Audi, PTA pager [phone]'

## 2015-12-11 NOTE — Progress Notes (Signed)
Attempted to call report to [facility] left message with supervisor Janan Ridge/answering machine

## 2015-12-11 NOTE — Progress Notes (Signed)
Patient ID: [name] BostonStephen L [name], male   DOB: 1950-12-14, 65 y.o.   MRN: [id number] Wound VAC changed over to the [facility] portable wound VAC this morning. Plan for discharge to skilled nursing today. No change in patients status from discharge summary dictated yesterday.

## 2015-12-11 NOTE — Clinical Social Work Note (Signed)
[facility].  Patient and family agreeable to plans will transport via personal car RN to call report [phone].  Windell MouldingEric Nitin Mckowen, MSW, Theresia MajorsLCSWA [phone]

## 2015-12-12 ENCOUNTER — Encounter: Payer: Self-pay | Admitting: Adult Health

## 2015-12-12 ENCOUNTER — Non-Acute Institutional Stay (SKILLED_NURSING_FACILITY): Payer: BLUE CROSS/BLUE SHIELD | Admitting: Adult Health

## 2015-12-12 DIAGNOSIS — I96 Gangrene, not elsewhere classified: Secondary | ICD-10-CM

## 2015-12-12 DIAGNOSIS — R2681 Unsteadiness on feet: Secondary | ICD-10-CM | POA: Diagnosis not present

## 2015-12-12 DIAGNOSIS — K219 Gastro-esophageal reflux disease without esophagitis: Secondary | ICD-10-CM

## 2015-12-12 DIAGNOSIS — I739 Peripheral vascular disease, unspecified: Secondary | ICD-10-CM | POA: Diagnosis not present

## 2015-12-12 DIAGNOSIS — Z794 Long term (current) use of insulin: Secondary | ICD-10-CM

## 2015-12-12 DIAGNOSIS — IMO0001 Reserved for inherently not codable concepts without codable children: Secondary | ICD-10-CM

## 2015-12-12 DIAGNOSIS — E538 Deficiency of other specified B group vitamins: Secondary | ICD-10-CM

## 2015-12-12 DIAGNOSIS — E785 Hyperlipidemia, unspecified: Secondary | ICD-10-CM | POA: Diagnosis not present

## 2015-12-12 DIAGNOSIS — I2510 Atherosclerotic heart disease of native coronary artery without angina pectoris: Secondary | ICD-10-CM | POA: Diagnosis not present

## 2015-12-12 DIAGNOSIS — E119 Type 2 diabetes mellitus without complications: Secondary | ICD-10-CM | POA: Diagnosis not present

## 2015-12-12 NOTE — Progress Notes (Addendum)
Patient ID: [name], male   DOB: 1951/02/10, 65 y.o.   MRN: [id number] .    DATE:    12/12/15   MRN:  [id number]  BIRTHDAY: 19-Apr-1951  Facility:  Nursing Home Location:  [facility]  Nursing Home Room Number: 1203-P  LEVEL OF CARE:  SNF (31)  Contact Information    Name Relation Home Work Mobile   Moody Friend [phone]  [phone]   [place], [place] Father [phone]     Lisle, [place] Daughter   [phone]       Code Status History    Date Active Date Inactive Code Status Order ID Comments User Context   07/31/2014  1:29 PM 07/31/2014  6:30 PM Full Code [id number]  Yates Decamp, MD Inpatient   05/29/2014  1:47 PM 05/29/2014 10:49 PM Full Code [id number]  Yates Decamp, MD Inpatient   01/23/2014  1:43 PM 01/23/2014  8:27 PM Full Code [id number]  Yates Decamp, MD Inpatient       Chief Complaint  Patient presents with  . Hospitalization Follow-up    HISTORY OF PRESENT ILLNESS:  This is a 65 year old male who has been admitted to [facility] on 12/11/15 from [facility]. He has PMH of CAD, PVD, Dyslipidemia, Diabetes Mellitus type I and diabetic retinopathy. He had a non-healing wound of his left foot. He had a left common femoral to below knee popliteal artery bypass graft with ipsilateral translocated non-reversed saphenous vein on 10/24/15. 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 However, he continued to have progressive gangrenous changes to the LLE. 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  He has been admitted for a short-term rehabilitation.  PAST MEDICAL HISTORY:  Past Medical History  Diagnosis Date  . Coronary artery disease   . Peripheral vascular disease (HCC)   . Dyslipidemia   . Cigarette smoker   . Diabetes mellitus     type 1  x 50 yrs  . Diabetic retinopathy (HCC)      CURRENT MEDICATIONS: Reviewed    Medication List       This list is accurate as of: 12/12/15 11:59 PM.  Always use your most recent med list.                acetaminophen 500 MG tablet  Commonly known as:  TYLENOL  Take 1,000 mg by mouth 2 (two) times daily.     aspirin EC 81 MG tablet  Take 81 mg by mouth daily.     baclofen 20 MG tablet  Commonly known as:  LIORESAL  Take 20 mg by mouth daily as needed (for leg cramps). Reported on 12/05/2015     cilostazol 100 MG tablet  Commonly known as:  PLETAL  Take 1 tablet (100 mg total) by mouth 2 (two) times daily.     doxycycline 100 MG tablet  Commonly known as:  VIBRA-TABS  Take 100 mg by mouth 2 (two) times daily. Apply to surgical wound LLE x28 days     folic acid 800 MCG tablet  Commonly known as:  FOLVITE  Take 1,600 mcg by mouth daily.     insulin aspart 100 UNIT/ML injection  Commonly known as:  novoLOG  Inject 0-9 Units into the skin. Sliding care coverage AC & HS as follows:  CBG: 121-150 = 1 unit, 151-200 = 2 units, 201-250 = 3 units, 251-300 = 5 units, 301-350 = 7 units, 351-400 = 9 units.  If greater than 400 notify on call  MD/NP     insulin aspart 100 UNIT/ML injection  Commonly known as:  novoLOG  Inject 4 Units into the skin 3 (three) times daily with meals. Administer only if patient eats 50% of meal     LEVEMIR 100 UNIT/ML injection  Generic drug:  insulin detemir  Inject 25 Units into the skin daily.     metoprolol succinate 25 MG 24 hr tablet  Commonly known as:  TOPROL-XL  Take 1 tablet (25 mg total) by mouth daily.     MULTIVITAMIN GUMMIES ADULT PO  Take 1 tablet by mouth daily.     pravastatin 40 MG tablet  Commonly known as:  PRAVACHOL  Take 40 mg by mouth at bedtime.     ramipril 10 MG tablet  Commonly known as:  ALTACE  Take 10 mg by mouth daily.         Allergies  Allergen Reactions  . Simvastatin Other (See Comments)    MYALGIAS, MUSCLE WEAKNESS      REVIEW OF SYSTEMS:  GENERAL: no change in appetite, no fatigue, no weight changes, no fever, chills or weakness EYES: Denies change in vision, dry eyes, eye pain, itching or  discharge EARS: Denies change in hearing, ringing in ears, or earache NOSE: Denies nasal congestion or epistaxis MOUTH and THROAT: Denies oral discomfort, gingival pain or bleeding, pain from teeth or hoarseness   RESPIRATORY: no cough, SOB, DOE, wheezing, hemoptysis CARDIAC: no chest pain, edema or palpitations GI: no abdominal pain, diarrhea, constipation, heart burn, nausea or vomiting GU: Denies dysuria, frequency, hematuria, incontinence, or discharge PSYCHIATRIC: Denies feeling of depression or anxiety. No report of hallucinations, insomnia, paranoia, or agitation   PHYSICAL EXAMINATION  GENERAL APPEARANCE: Well nourished. In no acute distress. Normal body habitus SKIN:  Left BKA covered with dressing and ACE wrap, dry HEAD: Normal in size and contour. No evidence of trauma EYES: Lids open and close normally. No blepharitis, entropion or ectropion. PERRL. Conjunctivae are clear and sclerae are white. Lenses are without opacity EARS: Pinnae are normal. Patient hears normal voice tunes of the examiner MOUTH and THROAT: Lips are without lesions. Oral mucosa is moist and without lesions. Tongue is normal in shape, size, and color and without lesions NECK: supple, trachea midline, no neck masses, no thyroid tenderness, no thyromegaly LYMPHATICS: no LAN in the neck, no supraclavicular LAN RESPIRATORY: breathing is even & unlabored, BS CTAB CARDIAC: RRR, no murmur,no extra heart sounds, no edema GI: abdomen soft, normal BS, no masses, no tenderness, no hepatomegaly, no splenomegaly EXTREMITIES:  Able to move X 4 extremities PSYCHIATRIC: Alert and oriented X 3. Affect and behavior are appropriate  LABS/RADIOLOGY: Labs reviewed: Basic Metabolic Panel:  Recent Labs  16/05/9601/28/17 1308 10/24/15 2300 10/25/15 0626 12/06/15 1146 12/06/15 1308  NA 136  --  135 139 138  K 4.9  --  4.4 5.5* 4.4  CL 104  --  103 106  --   CO2 26  --  24 22  --   GLUCOSE 305*  --  253* 139* 144*  BUN 15  --   23* 17  --   CREATININE 0.85 1.10 0.97 0.87  --   CALCIUM 9.0  --  8.2* 9.2  --    Liver Function Tests:  Recent Labs  10/22/15 1308  AST 15  ALT 14*  ALKPHOS 77  BILITOT 0.5  PROT 6.6  ALBUMIN 3.5   CBC:  Recent Labs  10/24/15 2300 10/25/15 0626 12/06/15 1146 12/06/15 1308  WBC 10.7* 13.3* 10.5  --   HGB 11.8* 11.8* 11.9* 11.2*  HCT 35.0* 33.2* 36.4* 33.0*  MCV 92.8 93.0 93.1  --   PLT 377 387 486*  --    CBG:  Recent Labs  12/10/15 2138 12/11/15 0626 12/11/15 1140  GLUCAP 316* 366* 245*     Dg Chest 2 View  12/06/2015  CLINICAL DATA:  Gangrene of the left foot. EXAM: CHEST  2 VIEW COMPARISON:  09/01/2014 FINDINGS: Normal heart size and mediastinal contours. No acute infiltrate or edema. No effusion or pneumothorax. No acute osseous findings. IMPRESSION: No active cardiopulmonary disease. Electronically Signed   By: Marnee Spring M.D.   On: 12/06/2015 11:37    ASSESSMENT/PLAN:  Unsteady Gait - for rehabilitation  Gangrenous left foot S/P BKA - for rehabilitation; continue Baclofen 20 mg 1 tab by mouth daily when necessary for muscle spasm; continue Pletal 100 mg 1 tab by mouth twice a day; continue acetaminophen 500 mg 2 tabs  = 1000 mg by mouth twice a day for pain; doxycycline 100 mg 1 tab by mouth twice a day till 01/07/16 follow-up with Dr. Lajoyce Corners, orthopedics; Check BMP and CBC  Diabetes mellitus, type I - hemoglobin A1c 9.1; insulin pump was recently discontinued and started on Levemir 25 units subcutaneous daily, NovoLog sliding scale 3 times a day and at bedtime and NovoLog 4 units subcutaneous 3 times a day if patient eats at least 50% of meals  Folic acid deficiency - continue folic acid 800 g take 2 tabs = 600 mg by mouth daily  Claudication in PVD - continue Pletal 100 mg 1 tab by mouth twice a day  CAD - continue aspirin 81 mg daily  Hypertension - continue Altace 10 mg 1 tab by mouth daily and Toprol-XL 25 mg 1 tab by mouth  daily  Hyperlipidemia - continue Pravachol 40 mg 1 tab by mouth daily at bedtime; check lipid panel  GERD - discontinue Prilosec per patient request and start Tums 500 mg 2 tabs by mouth every 6 hours when necessary      Goals of care:  Short-term rehabilitation    [facility], NP [facility] [phone]

## 2015-12-13 ENCOUNTER — Non-Acute Institutional Stay (SKILLED_NURSING_FACILITY): Payer: BLUE CROSS/BLUE SHIELD | Admitting: Internal Medicine

## 2015-12-13 ENCOUNTER — Encounter: Payer: Self-pay | Admitting: Internal Medicine

## 2015-12-13 DIAGNOSIS — D62 Acute posthemorrhagic anemia: Secondary | ICD-10-CM

## 2015-12-13 DIAGNOSIS — R2681 Unsteadiness on feet: Secondary | ICD-10-CM

## 2015-12-13 DIAGNOSIS — I2510 Atherosclerotic heart disease of native coronary artery without angina pectoris: Secondary | ICD-10-CM | POA: Diagnosis not present

## 2015-12-13 DIAGNOSIS — I739 Peripheral vascular disease, unspecified: Secondary | ICD-10-CM | POA: Diagnosis not present

## 2015-12-13 DIAGNOSIS — F17200 Nicotine dependence, unspecified, uncomplicated: Secondary | ICD-10-CM

## 2015-12-13 DIAGNOSIS — E119 Type 2 diabetes mellitus without complications: Secondary | ICD-10-CM

## 2015-12-13 DIAGNOSIS — Z794 Long term (current) use of insulin: Secondary | ICD-10-CM | POA: Diagnosis not present

## 2015-12-13 DIAGNOSIS — Z89512 Acquired absence of left leg below knee: Secondary | ICD-10-CM | POA: Diagnosis not present

## 2015-12-13 DIAGNOSIS — K219 Gastro-esophageal reflux disease without esophagitis: Secondary | ICD-10-CM

## 2015-12-13 DIAGNOSIS — IMO0001 Reserved for inherently not codable concepts without codable children: Secondary | ICD-10-CM

## 2015-12-13 LAB — CBC AND DIFFERENTIAL
HCT: 30 % — AB (ref 41–53)
Hemoglobin: 9.8 g/dL — AB (ref 13.5–17.5)
PLATELETS: 453 10*3/uL — AB (ref 150–399)
WBC: 7.6 10*3/mL

## 2015-12-13 LAB — BASIC METABOLIC PANEL
BUN: 22 mg/dL — AB (ref 4–21)
CREATININE: 0.8 mg/dL (ref 0.6–1.3)
Glucose: 343 mg/dL
Potassium: 5.2 mmol/L (ref 3.4–5.3)
Sodium: 136 mmol/L — AB (ref 137–147)

## 2015-12-13 NOTE — Progress Notes (Signed)
LOCATION: [facility]  PCP: [name], MD   Code Status: Full Code  Goals of care: Advanced Directive information Advanced Directives 12/06/2015  Does patient have an advance directive? No  Type of Advance Directive -  Does patient want to make changes to advanced directive? -  Copy of advanced directive(s) in chart? -  Would patient like information on creating an advanced directive? No - patient declined information       Extended Emergency Contact Information Primary Emergency Contact: Kizzie Bane Address: [address]          [place], [place] [address] Darden Amber of [place] Home Phone: [phone] Mobile Phone: [phone] Relation: Friend Secondary Emergency Contact: Laverle Hobby Address: [address]          [place], [place] [address] Darden Amber of [place] Home Phone: [phone] Relation: Father   Allergies  Allergen Reactions  . Simvastatin Other (See Comments)    MYALGIAS, MUSCLE WEAKNESS     Chief Complaint  Patient presents with  . New Admit To SNF    New Admission     HPI:  Patient is a 65 y.o. male seen today for short term rehabilitation post hospital admission from 12/06/15-12/10/15 with gangrene of left foot. 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 He has PMH of poorly controlled diabetes mellitus on insulin pump, PAD, CAD, HLD. He is seen in his room today. He has been smoking cigarettes in the facility. He has been travelling home with his family members for several hours of the day and then returning to the facility. He has been taking his pain medication and this has been helpful.   Review of Systems:  Constitutional: Negative for fever, chills, diaphoresis.  HENT: Negative for headache, congestion, nasal discharge, hearing loss, sore throat, difficulty swallowing.   Eyes: Negative for blurred vision, double vision and discharge.  Respiratory: Negative for cough, shortness of breath and wheezing.     Cardiovascular: Negative for chest pain, palpitations, leg swelling.  Gastrointestinal: Negative for heartburn, vomiting, abdominal pain, loss of appetite. Positive for nausea. Last bowel movement was 2 days ago.  Genitourinary: Negative for dysuria and flank pain.  Musculoskeletal: Negative for back pain, fall in the facility.  Skin: Negative for itching, rash.  Neurological: Negative for dizziness. Psychiatric/Behavioral: Negative for depression   Past Medical History  Diagnosis Date  . Coronary artery disease   . Peripheral vascular disease (HCC)   . Dyslipidemia   . Cigarette smoker   . Diabetes mellitus     type 1  x 50 yrs  . Diabetic retinopathy [facility])    Past Surgical History  Procedure Laterality Date  . Lower extremity angiogram N/A 01/23/2014    Procedure: LOWER EXTREMITY ANGIOGRAM;  Surgeon: Pamella Pert, MD;  Location: [facility] CATH LAB;  Service: Cardiovascular;  Laterality: N/A;  . Abdominal angiogram N/A 05/29/2014    Procedure: ABDOMINAL ANGIOGRAM;  Surgeon: Pamella Pert, MD;  Location: [facility] CATH LAB;  Service: Cardiovascular;  Laterality: N/A;  . Lower extremity angiogram N/A 07/31/2014    Procedure: LOWER EXTREMITY ANGIOGRAM;  Surgeon: Pamella Pert, MD;  Location: [facility] CATH LAB;  Service: Cardiovascular;  Laterality: N/A;  . Peripheral vascular catheterization Left 10/11/2015    Procedure: Peripheral Vascular Balloon Angioplasty;  Surgeon: Nada Libman, MD;  Location: [facility] INVASIVE CV LAB;  Service: Cardiovascular;  Laterality: Left;  sfa failed unable to cross occluded sfa  . Peripheral vascular catheterization N/A 10/11/2015    Procedure: Abdominal Aortogram;  Surgeon:  Nada Libman, MD;  Location: [facility] INVASIVE CV LAB;  Service: Cardiovascular;  Laterality: N/A;  . Lower extremity angiogram Left 10/11/2015    Procedure: Lower Extremity Angiogram;  Surgeon: Nada Libman, MD;  Location: [facility] INVASIVE CV LAB;  Service: Cardiovascular;  Laterality: Left;  . Cardiac  catheterization      EF is 55-60% and no wall motion abnormalities (long long time ago)  . Fracture surgery      left arm "many yrs ago"  . Femoral-popliteal bypass graft Left 10/24/2015    Procedure: BYPASS GRAFT FEMORAL below knee POPLITEAL ARTERY with Left Saphenous Vein;  Surgeon: Nada Libman, MD;  Location: [facility] OR;  Service: Vascular;  Laterality: Left;  . Amputation Left 10/25/2015    Procedure: Left Foot 5th Ray Amputation;  Surgeon: Nadara Mustard, MD;  Location: [facility] OR;  Service: Orthopedics;  Laterality: Left;  . Amputation Left 12/06/2015    Procedure: AMPUTATION BELOW KNEE;  Surgeon: Nadara Mustard, MD;  Location: [facility] OR;  Service: Orthopedics;  Laterality: Left;   Social History:   reports that he has been smoking Cigarettes.  He has a 15 pack-year smoking history. He has never used smokeless tobacco. He reports that he drinks alcohol. He reports that he does not use illicit drugs.  Family History  Problem Relation Age of Onset  . Coronary artery disease Mother     Medications:   Medication List       This list is accurate as of: 12/13/15  2:40 PM.  Always use your most recent med list.               acetaminophen 500 MG tablet  Commonly known as:  TYLENOL  Take 1,000 mg by mouth 2 (two) times daily.     aspirin EC 81 MG tablet  Take 81 mg by mouth daily.     baclofen 20 MG tablet  Commonly known as:  LIORESAL  Take 20 mg by mouth daily as needed (for leg cramps). Reported on 12/05/2015     calcium carbonate 500 MG chewable tablet  Commonly known as:  TUMS - dosed in mg elemental calcium  Chew 2 tablets by mouth every 6 (six) hours as needed for indigestion or heartburn.     cilostazol 100 MG tablet  Commonly known as:  PLETAL  Take 1 tablet (100 mg total) by mouth 2 (two) times daily.     doxycycline 100 MG tablet  Commonly known as:  VIBRA-TABS  Take 100 mg by mouth 2 (two) times daily. Apply to surgical wound LLE x28 days     folic acid 800 MCG tablet    Commonly known as:  FOLVITE  Take 1,600 mcg by mouth daily.     insulin aspart 100 UNIT/ML injection  Commonly known as:  novoLOG  Inject 0-9 Units into the skin. Sliding care coverage AC & HS as follows:  CBG: 121-150 = 1 unit, 151-200 = 2 units, 201-250 = 3 units, 251-300 = 5 units, 301-350 = 7 units, 351-400 = 9 units.  If greater than 400 notify on call MD/NP     insulin aspart 100 UNIT/ML injection  Commonly known as:  novoLOG  Inject 4 Units into the skin 3 (three) times daily with meals. Administer only if patient eats 50% of meal     LEVEMIR 100 UNIT/ML injection  Generic drug:  insulin detemir  Inject 30 Units into the skin daily.     metoprolol succinate 25 MG  24 hr tablet  Commonly known as:  TOPROL-XL  Take 1 tablet (25 mg total) by mouth daily.     MULTIVITAMIN GUMMIES ADULT PO  Take 1 tablet by mouth daily.     oxyCODONE-acetaminophen 5-325 MG tablet  Commonly known as:  PERCOCET/ROXICET  Take 1 tablet by mouth every 4 (four) hours as needed for severe pain.     pravastatin 40 MG tablet  Commonly known as:  PRAVACHOL  Take 40 mg by mouth at bedtime.     ramipril 10 MG tablet  Commonly known as:  ALTACE  Take 10 mg by mouth daily.        Immunizations: Immunization History  Administered Date(s) Administered  . Tdap 07/03/2013     Physical Exam: Filed Vitals:   12/13/15 1432  BP: 132/59  Pulse: 74  Temp: 98.5 F (36.9 C)  TempSrc: Oral  Resp: 16  Height: 5\' 11"  (1.803 m)  Weight: 162 lb (73.483 kg)  SpO2: 97%   Body mass index is 22.6 kg/(m^2).  General- adult male, thin built, in no acute distress Head- normocephalic, atraumatic Nose- no maxillary or frontal sinus tenderness, no nasal discharge Throat- moist mucus membrane  Eyes- PERRLA, EOMI, no pallor, no icterus, no discharge, normal conjunctiva, normal sclera Neck- no cervical lymphadenopathy Cardiovascular- normal s1,s2, no murmur Respiratory- bilateral clear to auscultation, no  wheeze, no rhonchi, no crackles, no use of accessory muscles Abdomen- bowel sounds present, soft, non tender Musculoskeletal- left BKA with dressing in place and dressing clean and dry, able to move all other 3 extremities, no spinal and paraspinal tenderness Neurological- alert and oriented to person, place and time Skin- warm and dry Psychiatry- normal mood and affect    Labs reviewed: Basic Metabolic Panel:  Recent Labs  16/05/9601/28/17 1308  10/25/15 0626 12/06/15 12/06/15 1146 12/06/15 1308  NA 136  --  135 139 139 138  K 4.9  --  4.4  --  5.5* 4.4  CL 104  --  103  --  106  --   CO2 26  --  24  --  22  --   GLUCOSE 305*  --  253*  --  139* 144*  BUN 15  --  23* 17 17  --   CREATININE 0.85  < > 0.97 0.9 0.87  --   CALCIUM 9.0  --  8.2*  --  9.2  --   < > = values in this interval not displayed. Liver Function Tests:  Recent Labs  10/22/15 1308  AST 15  ALT 14*  ALKPHOS 77  BILITOT 0.5  PROT 6.6  ALBUMIN 3.5   No results for input(s): LIPASE, AMYLASE in the last 8760 hours. No results for input(s): AMMONIA in the last 8760 hours. CBC:  Recent Labs  10/24/15 2300 10/25/15 0626 12/06/15 12/06/15 1146 12/06/15 1308  WBC 10.7* 13.3* 10.5 10.5  --   HGB 11.8* 11.8*  --  11.9* 11.2*  HCT 35.0* 33.2*  --  36.4* 33.0*  MCV 92.8 93.0  --  93.1  --   PLT 377 387  --  486*  --    Cardiac Enzymes: No results for input(s): CKTOTAL, CKMB, CKMBINDEX, TROPONINI in the last 8760 hours. BNP: Invalid input(s): POCBNP CBG:  Recent Labs  12/10/15 2138 12/11/15 0626 12/11/15 1140  GLUCAP 316* 366* 245*    Radiological Exams: Dg Chest 2 View  12/06/2015  CLINICAL DATA:  Gangrene of the left foot. EXAM: CHEST  2 VIEW  COMPARISON:  09/01/2014 FINDINGS: Normal heart size and mediastinal contours. No acute infiltrate or edema. No effusion or pneumothorax. No acute osseous findings. IMPRESSION: No active cardiopulmonary disease. Electronically Signed   By: Marnee Spring M.D.    On: 12/06/2015 11:37    Assessment/Plan  Unsteady gait Post left BKA. Will have him work with physical therapy and occupational therapy team to help with gait training and muscle strengthening exercises.fall precautions. Skin care. Encourage to be out of bed.   Left foot gangrene S/p left BKA. Will have patient work with PT/OT as tolerated to regain strength and restore function.  Fall precautions are in place. Continue oxycodone-APAP 5-325 mg q4h prn pain. Continue baclofen 20 mg daily as needed for leg cramps. To follow up with orthopedics  Blood loss anemia Post op, monitor h&h  PAD Continue pletal 100 mg bid and aspirin. Counselled on smoking cessation  HTN Stable, monitor bp, continue ramipril and metorpolol  Diabetes mellitus Insulin dependent. a1c 9.1, currently inuslin pump is turned off. Continue levemir 25 units daily with SSI novlog. Monitor cbg  CAD  Remains chest pain free. Continue metorpolol, ramipril and aspirin with statin  GERD Continue tums for now and monitor  Tobacco use counselled on smoking cessation, patient not willing to quit at present, he understands risks associated with smoking.    Goals of care: short term rehabilitation   Labs/tests ordered: a1c, cbc, cmp  Family/ staff Communication: reviewed care plan with patient and nursing supervisor    Oneal Grout, MD Internal Medicine [facility] [address] [place], [place] [address] Cell Phone (Monday-Friday 8 am - 5 pm): [phone] On Call: [phone] and follow prompts after 5 pm and on weekends Office Phone: [phone] Office Fax: [phone]

## 2015-12-16 ENCOUNTER — Telehealth: Payer: Self-pay | Admitting: Surgery

## 2015-12-16 ENCOUNTER — Ambulatory Visit: Payer: BLUE CROSS/BLUE SHIELD | Admitting: Surgery

## 2015-12-16 NOTE — Telephone Encounter (Signed)
cxled appt for 4/24.

## 2015-12-16 NOTE — Telephone Encounter (Signed)
-----   Message from Phillips Odorarol S Pullins, RN sent at 12/13/2015  3:55 PM EDT ----- Regarding: [name], [name] Please cancel appt. at 8:30 AM on 12/16/15 with [facility]; this pt. called and is in a rehab facility [facility]) following a Transtibial Amputation (L) mid level 4/14 per Dr. Lajoyce Cornersuda.

## 2015-12-17 LAB — CBC AND DIFFERENTIAL
HEMATOCRIT: 30 % — AB (ref 41–53)
Hemoglobin: 10 g/dL — AB (ref 13.5–17.5)
Neutrophils Absolute: 2 /uL
Platelets: 508 10*3/uL — AB (ref 150–399)
WBC: 5.5 10*3/mL

## 2015-12-17 LAB — BASIC METABOLIC PANEL
BUN: 17 mg/dL (ref 4–21)
Creatinine: 0.8 mg/dL (ref 0.6–1.3)
GLUCOSE: 62 mg/dL
POTASSIUM: 4.4 mmol/L (ref 3.4–5.3)
Sodium: 141 mmol/L (ref 137–147)

## 2015-12-17 LAB — HEPATIC FUNCTION PANEL
ALT: 10 U/L (ref 10–40)
AST: 10 U/L — AB (ref 14–40)
Alkaline Phosphatase: 77 U/L (ref 25–125)
BILIRUBIN, TOTAL: 0.3 mg/dL

## 2015-12-18 ENCOUNTER — Non-Acute Institutional Stay: Payer: BLUE CROSS/BLUE SHIELD | Admitting: Adult Health

## 2015-12-18 ENCOUNTER — Encounter: Payer: Self-pay | Admitting: Adult Health

## 2015-12-18 DIAGNOSIS — E119 Type 2 diabetes mellitus without complications: Secondary | ICD-10-CM

## 2015-12-18 DIAGNOSIS — I96 Gangrene, not elsewhere classified: Secondary | ICD-10-CM

## 2015-12-18 DIAGNOSIS — K219 Gastro-esophageal reflux disease without esophagitis: Secondary | ICD-10-CM

## 2015-12-18 DIAGNOSIS — I739 Peripheral vascular disease, unspecified: Secondary | ICD-10-CM

## 2015-12-18 DIAGNOSIS — E785 Hyperlipidemia, unspecified: Secondary | ICD-10-CM

## 2015-12-18 DIAGNOSIS — I2510 Atherosclerotic heart disease of native coronary artery without angina pectoris: Secondary | ICD-10-CM | POA: Diagnosis not present

## 2015-12-18 DIAGNOSIS — IMO0001 Reserved for inherently not codable concepts without codable children: Secondary | ICD-10-CM

## 2015-12-18 DIAGNOSIS — R2681 Unsteadiness on feet: Secondary | ICD-10-CM | POA: Diagnosis not present

## 2015-12-18 DIAGNOSIS — E538 Deficiency of other specified B group vitamins: Secondary | ICD-10-CM | POA: Diagnosis not present

## 2015-12-18 DIAGNOSIS — Z794 Long term (current) use of insulin: Secondary | ICD-10-CM | POA: Diagnosis not present

## 2015-12-18 NOTE — Progress Notes (Signed)
Patient ID: [name], male   DOB: Feb 14, 1951, 65 y.o.   MRN: [id number] .    DATE:    12/18/15   MRN:  [id number]  BIRTHDAY: Feb 14, 1951  Facility:  Nursing Home Location:  [facility]  Nursing Home Room Number: 1203-P  LEVEL OF CARE:  SNF (31)      Contact Information    Name Relation Home Work Mobile   [name] Friend [phone]  [phone]   [name] [phone]     [name] Daughter   [phone]       Code Status History    Date Active Date Inactive Code Status Order ID Comments User Context   07/31/2014  1:29 PM 07/31/2014  6:30 PM Full Code [id number]  Yates DecampJay Ganji, MD Inpatient   05/29/2014  1:47 PM 05/29/2014 10:49 PM Full Code [id number]  Yates DecampJay Ganji, MD Inpatient   01/23/2014  1:43 PM 01/23/2014  8:27 PM Full Code [id number]  Yates DecampJay Ganji, MD Inpatient       Chief Complaint  Patient presents with  . Discharge Note    HISTORY OF PRESENT ILLNESS:  This is a 65 year old male who is for discharge home with Home health PT for endurance, OT for ADLs and skilled Nurse for dressing/wound care 3X/week. DME:  18" X 16" wheelchair with left amputee leg rest, and swing out arm rest and 3-in-1 commode. Patient used his own insulin pump while at the facility  [facility]. He has PMH of CAD, PVD, Dyslipidemia, Diabetes Mellitus type I and diabetic retinopathy. He had a non-healing wound of his left foot. He had a left common femoral to below knee popliteal artery bypass graft with ipsilateral translocated non-reversed saphenous vein on 10/24/15. 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 However, he continued to have progressive gangrenous changes to the LLE. 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  Patient was admitted to this facility for short-term rehabilitation after the patient's recent hospitalization.  Patient has completed SNF rehabilitation and therapy has cleared the  patient for discharge.   PAST MEDICAL HISTORY:  Past Medical History  Diagnosis Date  . Coronary artery disease   . Peripheral vascular disease (HCC)   . Dyslipidemia   . Cigarette smoker   . Diabetes mellitus     type 1  x 50 yrs  . Diabetic retinopathy (HCC)      CURRENT MEDICATIONS: Reviewed    Medication List       This list is accurate as of: 12/18/15  3:53 PM.  Always use your most recent med list.               acetaminophen 500 MG tablet  Commonly known as:  TYLENOL  Take 1,000 mg by mouth 2 (two) times daily.     aspirin EC 81 MG tablet  Take 81 mg by mouth daily.     baclofen 20 MG tablet  Commonly known as:  LIORESAL  Take 20 mg by mouth daily as needed (for leg cramps). Reported on 12/05/2015     calcium carbonate 500 MG chewable tablet  Commonly known as:  TUMS - dosed in mg elemental calcium  Chew 2 tablets by mouth every 6 (six) hours as needed for indigestion or heartburn.     cilostazol 100 MG tablet  Commonly known as:  PLETAL  Take 1 tablet (100 mg total) by mouth 2 (two) times daily.  doxycycline 100 MG tablet  Commonly known as:  VIBRA-TABS  Take 100 mg by mouth 2 (two) times daily. Apply to surgical wound LLE x28 days     folic acid 800 MCG tablet  Commonly known as:  FOLVITE  Take 1,600 mcg by mouth daily.     metoprolol succinate 25 MG 24 hr tablet  Commonly known as:  TOPROL-XL  Take 1 tablet (25 mg total) by mouth daily.     MULTIVITAMIN GUMMIES ADULT PO  Take 1 tablet by mouth daily.     oxyCODONE-acetaminophen 5-325 MG tablet  Commonly known as:  PERCOCET/ROXICET  Take 1 tablet by mouth every 4 (four) hours as needed for severe pain.     pravastatin 40 MG tablet  Commonly known as:  PRAVACHOL  Take 40 mg by mouth at bedtime.     ramipril 10 MG tablet  Commonly known as:  ALTACE  Take 10 mg by mouth daily.         Allergies  Allergen Reactions  . Simvastatin Other (See Comments)    MYALGIAS, MUSCLE WEAKNESS       REVIEW OF SYSTEMS:  GENERAL: no change in appetite, no fatigue, no weight changes, no fever, chills or weakness EYES: Denies change in vision, dry eyes, eye pain, itching or discharge EARS: Denies change in hearing, ringing in ears, or earache NOSE: Denies nasal congestion or epistaxis MOUTH and THROAT: Denies oral discomfort, gingival pain or bleeding, pain from teeth or hoarseness   RESPIRATORY: no cough, SOB, DOE, wheezing, hemoptysis CARDIAC: no chest pain, edema or palpitations GI: no abdominal pain, diarrhea, constipation, heart burn, nausea or vomiting GU: Denies dysuria, frequency, hematuria, incontinence, or discharge PSYCHIATRIC: Denies feeling of depression or anxiety. No report of hallucinations, insomnia, paranoia, or agitation   PHYSICAL EXAMINATION  GENERAL APPEARANCE: Well nourished. In no acute distress. Normal body habitus SKIN:  Left BKA covered with dressing and ACE wrap, dry HEAD: Normal in size and contour. No evidence of trauma EYES: Lids open and close normally. No blepharitis, entropion or ectropion. PERRL. Conjunctivae are clear and sclerae are white. Lenses are without opacity EARS: Pinnae are normal. Patient hears normal voice tunes of the examiner MOUTH and THROAT: Lips are without lesions. Oral mucosa is moist and without lesions. Tongue is normal in shape, size, and color and without lesions NECK: supple, trachea midline, no neck masses, no thyroid tenderness, no thyromegaly LYMPHATICS: no LAN in the neck, no supraclavicular LAN RESPIRATORY: breathing is even & unlabored, BS CTAB CARDIAC: RRR, no murmur,no extra heart sounds, no edema GI: abdomen soft, normal BS, no masses, no tenderness, no hepatomegaly, no splenomegaly EXTREMITIES:  Able to move X 4 extremities, left BKA PSYCHIATRIC: Alert and oriented X 3. Affect and behavior are appropriate  LABS/RADIOLOGY: Labs reviewed: 12/13/15   Cholesterol 125  HDL 33.8  LDL 78  Triglycerides  68 Basic  Metabolic Panel:  Recent Labs  16/10/96 1308  10/25/15 0626  12/06/15 1146 12/06/15 1308 12/13/15 12/17/15  NA 136  --  135  < > 139 138 136* 141  K 4.9  --  4.4  --  5.5* 4.4 5.2 4.4  CL 104  --  103  --  106  --   --   --   CO2 26  --  24  --  22  --   --   --   GLUCOSE 305*  --  253*  --  139* 144*  --   --  BUN 15  --  23*  < > 17  --  22* 17  CREATININE 0.85  < > 0.97  < > 0.87  --  0.8 0.8  CALCIUM 9.0  --  8.2*  --  9.2  --   --   --   < > = values in this interval not displayed. Liver Function Tests:  Recent Labs  10/22/15 1308 12/17/15  AST 15 10*  ALT 14* 10  ALKPHOS 77 77  BILITOT 0.5  --   PROT 6.6  --   ALBUMIN 3.5  --    CBC:  Recent Labs  10/24/15 2300 10/25/15 0626  12/06/15 1146 12/06/15 1308 12/13/15 12/17/15  WBC 10.7* 13.3*  < > 10.5  --  7.6 5.5  NEUTROABS  --   --   --   --   --   --  2  HGB 11.8* 11.8*  --  11.9* 11.2* 9.8* 10.0*  HCT 35.0* 33.2*  --  36.4* 33.0* 30* 30*  MCV 92.8 93.0  --  93.1  --   --   --   PLT 377 387  --  486*  --  453* 508*  < > = values in this interval not displayed. CBG:  Recent Labs  12/10/15 2138 12/11/15 0626 12/11/15 1140  GLUCAP 316* 366* 245*     Dg Chest 2 View  12/06/2015  CLINICAL DATA:  Gangrene of the left foot. EXAM: CHEST  2 VIEW COMPARISON:  09/01/2014 FINDINGS: Normal heart size and mediastinal contours. No acute infiltrate or edema. No effusion or pneumothorax. No acute osseous findings. IMPRESSION: No active cardiopulmonary disease. Electronically Signed   By: Marnee Spring M.D.   On: 12/06/2015 11:37    ASSESSMENT/PLAN:  Unsteady Gait - for Home health PT, OT and Skilled Nurse  Gangrenous left foot S/P BKA - for Home health PT, OT and Skilled Nurse; continue Baclofen 20 mg 1 tab by mouth daily when necessary for muscle spasm; continue Pletal 100 mg 1 tab by mouth twice a day; continue acetaminophen 500 mg 2 tabs  = 1000 mg by mouth twice a day for pain; doxycycline 100 mg 1 tab by  mouth twice a day till 01/07/16 follow-up with Dr. Lajoyce Corners, orthopedics  Diabetes mellitus, type I - hemoglobin A1c 9.1; uses his insulin pump  Folic acid deficiency - continue folic acid 800 g take 2 tabs = 600 mg by mouth daily  Claudication in PVD - continue Pletal 100 mg 1 tab by mouth twice a day  CAD - continue aspirin 81 mg daily  Hypertension - continue Altace 10 mg 1 tab by mouth daily and Toprol-XL 25 mg 1 tab by mouth daily  Hyperlipidemia - continue Pravachol 40 mg 1 tab by mouth daily at bedtime Cholesterol 125  HDL 33.8  LDL 78  Triglycerides  68  GERD - continue Tums 500 mg 2 tabs by mouth every 6 hours when necessary      I have filled out patient's discharge paperwork and written prescriptions.  Patient will receive home health PT, OT and skilled Nurse.  DME provided:  18" X 16" wheelchair with left amputee leg rest, and swing out arm rest and 3-in-1 commode  Total discharge time: Greater than 30 minutes  Discharge time involved coordination of the discharge process with social worker, nursing staff and therapy department. Medical justification for home health services/DME verified.    [facility], [place], NP [employer] [phone]

## 2015-12-20 ENCOUNTER — Telehealth: Payer: Self-pay

## 2015-12-20 NOTE — Telephone Encounter (Signed)
Attempted to return call to pt.; rec'd voice message from pt. 4/27 stating he has "2 areas near left knee that are not healing, and are red."  Left voice message for pt. To return call to the office to discuss symptoms.

## 2015-12-25 NOTE — Telephone Encounter (Signed)
Able to reach pt. Per phone re: status of left leg.  Reported the areas that he called about last week near left knee "are looking a whole lot better."  Reported that Dr. Lajoyce Cornersuda checked them, and felt that they are doing okay.  Pt. Reported he will be seeing Dr. Lajoyce Cornersuda tomorrow for another follow-up appt.  Stated he will call our office if he needs to an appt. with Dr. Myra GianottiBrabham.

## 2016-02-13 ENCOUNTER — Ambulatory Visit: Payer: BLUE CROSS/BLUE SHIELD | Attending: Family | Admitting: Physical Therapy

## 2016-02-13 DIAGNOSIS — G546 Phantom limb syndrome with pain: Secondary | ICD-10-CM | POA: Diagnosis present

## 2016-02-13 DIAGNOSIS — R2689 Other abnormalities of gait and mobility: Secondary | ICD-10-CM | POA: Diagnosis not present

## 2016-02-13 DIAGNOSIS — R2681 Unsteadiness on feet: Secondary | ICD-10-CM | POA: Insufficient documentation

## 2016-02-13 DIAGNOSIS — R296 Repeated falls: Secondary | ICD-10-CM

## 2016-02-13 DIAGNOSIS — R29898 Other symptoms and signs involving the musculoskeletal system: Secondary | ICD-10-CM | POA: Insufficient documentation

## 2016-02-13 NOTE — Therapy (Signed)
[facility] [address] [place], [place], [address] Phone: [phone]   Fax:  [phone]  Physical Therapy Evaluation  Patient Details  Name: [name] MRN: [id number] Date of Birth: 09-06-1950 Referring Provider: Aldean Baker, MD  Encounter Date: 02/13/2016      PT End of Session - 02/13/16 1606    Visit Number 1   Number of Visits 21   Date for PT Re-Evaluation 04/24/16   Authorization Type [id number]   Authorization - Number of Visits 30   PT Start Time 1523   PT Stop Time 1624   PT Time Calculation (min) 61 min   Activity Tolerance Patient tolerated treatment well   Behavior During Therapy [facility] for tasks assessed/performed      Past Medical History  Diagnosis Date  . Coronary artery disease   . Peripheral vascular disease (HCC)   . Dyslipidemia   . Cigarette smoker   . Diabetes mellitus     type 1  x 50 yrs  . Diabetic retinopathy [facility])     Past Surgical History  Procedure Laterality Date  . Lower extremity angiogram N/A 01/23/2014    Procedure: LOWER EXTREMITY ANGIOGRAM;  Surgeon: Pamella Pert, MD;  Location: [facility] CATH LAB;  Service: Cardiovascular;  Laterality: N/A;  . Abdominal angiogram N/A 05/29/2014    Procedure: ABDOMINAL ANGIOGRAM;  Surgeon: Pamella Pert, MD;  Location: [facility] CATH LAB;  Service: Cardiovascular;  Laterality: N/A;  . Lower extremity angiogram N/A 07/31/2014    Procedure: LOWER EXTREMITY ANGIOGRAM;  Surgeon: Pamella Pert, MD;  Location: [facility] CATH LAB;  Service: Cardiovascular;  Laterality: N/A;  . Peripheral vascular catheterization Left 10/11/2015    Procedure: Peripheral Vascular Balloon Angioplasty;  Surgeon: Nada Libman, MD;  Location: [facility] INVASIVE CV LAB;  Service: Cardiovascular;  Laterality: Left;  sfa failed unable to cross occluded sfa  . Peripheral vascular catheterization N/A 10/11/2015    Procedure: Abdominal Aortogram;  Surgeon: Nada Libman, MD;  Location:  [facility] INVASIVE CV LAB;  Service: Cardiovascular;  Laterality: N/A;  . Lower extremity angiogram Left 10/11/2015    Procedure: Lower Extremity Angiogram;  Surgeon: Nada Libman, MD;  Location: [facility] INVASIVE CV LAB;  Service: Cardiovascular;  Laterality: Left;  . Cardiac catheterization      EF is 55-60% and no wall motion abnormalities (long long time ago)  . Fracture surgery      left arm "many yrs ago"  . Femoral-popliteal bypass graft Left 10/24/2015    Procedure: BYPASS GRAFT FEMORAL below knee POPLITEAL ARTERY with Left Saphenous Vein;  Surgeon: Nada Libman, MD;  Location: [facility] OR;  Service: Vascular;  Laterality: Left;  . Amputation Left 10/25/2015    Procedure: Left Foot 5th Ray Amputation;  Surgeon: Nadara Mustard, MD;  Location: [facility] OR;  Service: Orthopedics;  Laterality: Left;  . Amputation Left 12/06/2015    Procedure: AMPUTATION BELOW KNEE;  Surgeon: Nadara Mustard, MD;  Location: [facility] OR;  Service: Orthopedics;  Laterality: Left;    There were no vitals filed for this visit.       Subjective Assessment - 02/13/16 1531    Subjective The patient is a 65 yr old male here following a L transtibial amputation on 12/06/15. He recieved his first  prosthesis on 02/05/16 and is dependent in proper use & care. He is currently living with his girlfriend due to ampuation and would like to move back into his condo that is two stories.  He works as a Warehouse manager. He owns 3 tractors and 2 bob cats and can not get in either of these devices. He reports phantom limb pain every time he is wearing prosthesis but is currently in no pain. Phantom limb pain at its worse is a 9/10 and this occurs when walking with prosthesis, the patient relieves this pain by taking the leg off.   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 Yes   Pain Score  9   initially in session, 0/10 pain. During session 9/10   Pain Location Foot  phantom foot   Pain Orientation Left   Pain Descriptors / Indicators Stabbing   Pain Type Phantom pain;Acute pain   Pain Onset 1 to 4 weeks ago   Pain Frequency Intermittent   Aggravating Factors  weight bearing with prosthesis, gait / standing   Pain Relieving Factors removing prosthesis   Effect of Pain on Daily Activities limits standing   Multiple Pain Sites No            [facility] PT Assessment - 02/13/16 1530    Assessment   Medical Diagnosis L transtibial amputation    Referring Provider Aldean Baker, MD   Onset Date/Surgical Date 02/05/16  Prsothesis Delivery   Hand Dominance Right   Precautions   Precautions None   Restrictions   Weight Bearing Restrictions No   Balance Screen   Has the patient fallen in the past 6 months Yes   How many times? 2  [place] farm with walker   Has the patient had a decrease in activity level because of a fear of falling?  No   Is the patient reluctant to leave their home because of a fear of falling?  No   Home Environment   Living Environment Private residence   Living Arrangements Spouse/significant other   Available Help at Discharge Family   Type of Home House  Would like to move back to his Condo   Home Access Ramped entrance  Condo- curb + 1 step on sidewalk, ramp girlfriend's    Home Layout One level  Condo 2 levels   Home Equipment Wheelchair - [employer] - 2 wheels;Tub bench   Additional Comments Dogs   Prior Function   Level of Independence Independent   Vocation Full time employment   Vocation Requirements farming with lifting, carrying, pushing, pulling, climbing on tractors, pushing plates with feet to operate Bobcat. Also travels.    Leisure farming, being outside   Observation/Other Assessments   Focus on Therapeutic Outcomes (FOTO)  42.92 Functional Status   Other Surveys  Other Surveys   Fear Avoidance Belief Questionnaire (FABQ)  63  (16)   Posture/Postural Control   Posture/Postural Control Postural limitations   Postural Limitations Rounded Shoulders;Forward head;Flexed trunk;Weight shift right   ROM / Strength   AROM / PROM / Strength Strength;AROM   AROM   Overall AROM  Within functional limits for tasks performed   Strength   Overall Strength Comments Grossly assessed in sitting- hip flexion, abduction and adduction WNL.   Strength Assessment Site Knee;Ankle   Right/Left Knee Left   Left Knee Extension 4+/5   Right/Left Ankle Right   Right Ankle Dorsiflexion 4+/5   Transfers   Transfers Sit to Stand;Stand to Sit   Sit to Stand 5: Supervision;Without upper  extremity assist   Sit to Stand Details (indicate cue type and reason) Pt able to stand without use of hands, decreased weightshift towards the LLE   Stand to Sit 5: Supervision;Without upper extremity assist   Stand to Sit Details Pt able to sit with no use of hands   Ambulation/Gait   Ambulation/Gait Yes   Ambulation/Gait Assistance 5: Supervision   Ambulation/Gait Assistance Details Gait over level surfaces increased pts pain to 9/10 without device. PT instructed to use cane or crutches to limit weight bearing to decrease pain with weight bearing and enable to progress weight without high amounts of pain.    Ambulation Distance (Feet) 160 Feet  x1   Assistive device Prosthesis;None   Gait Pattern Step-through pattern;Decreased stance time - left;Decreased step length - right;Left foot flat;Lateral trunk lean to right;Decreased arm swing - left;Decreased stride length;Decreased hip/knee flexion - left;Decreased weight shift to left;Left circumduction;Left flexed knee in stance;Antalgic;Abducted - left;Poor foot clearance - left   Ambulation Surface Indoor;Level   Gait velocity 3.07 ft/s  indicates community ambulator   Stairs Yes   Stairs Assistance 5: Supervision   Stairs Assistance Details (indicate cue type and reason) Pt used 2 rails, prosthesis lead  leg with ascend and descend   Stair Management Technique Two rails;Step to pattern;Forwards   Number of Stairs 4   Standardized Balance Assessment   Standardized Balance Assessment Berg Balance Test   Berg Balance Test   Sit to Stand Able to stand without using hands and stabilize independently   Standing Unsupported Able to stand safely 2 minutes   Sitting with Back Unsupported but Feet Supported on Floor or Stool Able to sit safely and securely 2 minutes   Stand to Sit Sits safely with minimal use of hands   Transfers Able to transfer safely, minor use of hands   Standing Unsupported with Eyes Closed Able to stand 10 seconds safely   Standing Ubsupported with Feet Together Able to place feet together independently and stand 1 minute safely   From Standing, Reach Forward with Outstretched Arm Can reach forward >12 cm safely (5")   From Standing Position, Pick up Object from Floor Able to pick up shoe, needs supervision   From Standing Position, Turn to Look Behind Over each Shoulder Turn sideways only but maintains balance   Turn 360 Degrees Needs close supervision or verbal cueing   Standing Unsupported, Alternately Place Feet on Step/Stool Needs assistance to keep from falling or unable to try   Standing Unsupported, One Foot in Front Able to take small step independently and hold 30 seconds   Standing on One Leg Unable to try or needs assist to prevent fall   Total Score 39   Functional Gait  Assessment   Gait assessed  Yes   Gait Level Surface Walks 20 ft in less than 7 sec but greater than 5.5 sec, uses assistive device, slower speed, mild gait deviations, or deviates 6-10 in outside of the 12 in walkway width.   Change in Gait Speed Makes only minor adjustments to walking speed, or accomplishes a change in speed with significant gait deviations, deviates 10-15 in outside the 12 in walkway width, or changes speed but loses balance but is able to recover and continue walking.   Gait  with Horizontal Head Turns Performs head turns with moderate changes in gait velocity, slows down, deviates 10-15 in outside 12 in walkway width but recovers, can continue to walk.   Gait with Vertical Head Turns  Performs task with moderate change in gait velocity, slows down, deviates 10-15 in outside 12 in walkway width but recovers, can continue to walk.   Gait and Pivot Turn Turns slowly, requires verbal cueing, or requires several small steps to catch balance following turn and stop   Step Over Obstacle Is able to step over one shoe box (4.5 in total height) but must slow down and adjust steps to clear box safely. May require verbal cueing.   Gait with Narrow Base of Support Ambulates less than 4 steps heel to toe or cannot perform without assistance.   Gait with Eyes Closed Cannot walk 20 ft without assistance, severe gait deviations or imbalance, deviates greater than 15 in outside 12 in walkway width or will not attempt task.   Ambulating Backwards Walks 20 ft, slow speed, abnormal gait pattern, evidence for imbalance, deviates 10-15 in outside 12 in walkway width.   Steps Two feet to a stair, must use rail.   Total Score 9         Prosthetics Assessment - 02/13/16 1530    Prosthetics   Prosthetic Care Dependent with Residual limb care;Skin check;Care of non-amputated limb;Prosthetic cleaning;Ply sock cleaning;Correct ply sock adjustment;Proper wear schedule/adjustment;Proper weight-bearing schedule/adjustment   Prosthetic Care Comments  New wear time 2hrs on 2 hours off rotating during awake hours, everyday remove all tegaderm at night or sooner if drainage noted. Use gauze to pack wound to surface level and absorb mild drainage. Wear shrinker sock at all times prosthesis is off.   Donning prosthesis  Supervision   Doffing prosthesis  Supervision   Current prosthetic wear tolerance (days/week)  has worn daily since recieving prosthesis 8 days ago   Current prosthetic wear tolerance  (#hours/day)  wearing 4-5 hours/day. Today wore 3 hrs already  see prosthetic care for PT directions to new wear   Current prosthetic weight-bearing tolerance (hours/day)  Patient reported pain increased from 0/10 to 9/10 with gait at 160' / 5 minutes of standing.    Edema non pitting   Residual limb condition  16mm x 4mm with depth 4-55mm on medial distal limb- wet,  with odor and drainage. Small secondary wound with suture in it. PT removed. 31mmx1mm lateral distal tibia-superfisical. 84mmx3mm wound, reddness on distal end and pateallar tendon. Shiny with minimal hair growth    K code/activity level with prosthetic use  K4 full community variable cadence, high level activities                  [facility] Adult PT Treatment/Exercise - 02/13/16 1530    Prosthetics   Education Provided Skin check;Residual limb care;Prosthetic cleaning;Correct ply sock adjustment;Proper Donning;Proper wear schedule/adjustment   Person(s) Educated Patient   Education Method Explanation;Demonstration;Tactile cues;Verbal cues   Education Method Verbalized understanding;Returned demonstration;Tactile cues required;Verbal cues required;Needs further instruction                PT Education - 02/13/16 1525    Education provided Yes   Education Details PT POC, wound care and management   Person(s) Educated Patient   Methods Explanation;Demonstration;Verbal cues   Comprehension Verbalized understanding;Verbal cues required;Need further instruction          PT Short Term Goals - 02/13/16 1645    PT SHORT TERM GOAL #1   Title Pt verbalizes proper wear schedule for wound management & proper cleaning and demonstrates proper donning of prosthesis. (Target Date: 03/19/16)   Time 5   Period Weeks   Status New   PT  SHORT TERM GOAL #2   Title Pt will able to ambualte 300' including grass and pavement with LRAD & prosthesis with supervision (Target Date: 03/19/16)   Time 5   Period Weeks   Status New   PT  SHORT TERM GOAL #3   Title Pt will be able to negoitate ramps, curbs and stairs with supervision with LRAD & prosthesis (Target Date: 03/19/16)   Time 5   Period Weeks   Status New   PT SHORT TERM GOAL #4   Title Pt will be able to reach 10" laterally, across midline, anteriorly and to the floor with no UE support utilizing prosthesis with supervision (Target Date: 03/19/16)   PT SHORT TERM GOAL #5   Title Patient reports pain increases <6 increments with standing & gait activities. (Target Date: 03/19/16)   Time 5   Period Weeks   Status New           PT Long Term Goals - 02/13/16 1607    PT LONG TERM GOAL #1   Title Pt will wear prosthesis >90 % of all awake hours with no new wounds to indicate increased fucntional use with prosthesis (Target Date: 04/24/16)   Time 10   Period Weeks   Status New   PT LONG TERM GOAL #2   Title Pt will be indepedent in prosthetic and residual limb care to enable increased use of prosthesis to improve activity level  (Target Date: 04/24/16)   Time 10   Period Weeks   Status New   PT LONG TERM GOAL #3   Title Pt will be able to ambulate >1000' modified independent with prosthesis only including grass, pavement and level surfaces to indicate 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 (Target Date: 04/24/16)   Time 10   Period Weeks   Status New   PT LONG TERM GOAL #4   Title Pt will be able to negoitate ramps, curbs and stairs modfied indepedent with prosthesis only to enable increased community access (Target Date: 04/24/16)   Time 10   Period Weeks   Status New   PT LONG TERM GOAL #5   Title Pt will be improve Fuctional Gait Assessment will improve from 12/30 to >/= 24/30 to indicate decreased risk for falls (Target Date: 04/24/16)   Time 10   Period Weeks   Status New   Additional Long Term Goals   Additional Long Term Goals Yes   PT LONG TERM GOAL #6   Title Pt will increase Berg Balance Score from 40/56 to >/= 51/56 to  indicate a decreased risk for falls (Target Date: 04/24/16)   Time 10   Period Weeks   Status New   PT LONG TERM GOAL #7   Title Patient reports pain increases < 3 increments with standing & gait activities. (Target Date: 04/24/16)   Time 10   Period Weeks   Status New               Plan - 02/13/16 1654    Clinical Impression Statement The pt is a 65 yr old male referred after L transtibial amputation. He is currently dependent with all prosthetic and residual limb care. He has 4 wounds on distal end of residual limb that will require close guidance of skilled instruction to safely progress wear & use of prosthesis without negative effects on wound. This patient demostrates impaired static and dynamic balance, Berg Balance Score 39/56 and Funcitonal Gait Assessment 9/30 both indicating an increased risk  for falls. His current gait speed of 3.07 ft/s and gait deviations limit gait with prosthesis. Patient has significant increase in pain with weight bearing during prosthetic gait. 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 Skilled PT is needed for addressing above impairments to improve fucntional mobilty in the home and community and resume work tasks. Patient's condition is unstable with Type 1 diabetic wounds present and requires high level of complexity for plan of care.     Rehab Potential Good   Clinical Impairments Affecting Rehab Potential 4 wounds on residual limb with Type 1 Diabetes > 50 years   PT Frequency 2x / week   PT Duration Other (comment)  2x/week for 10 weeks   PT Treatment/Interventions ADLs/Self Care Home Management;Therapeutic exercise;Manual techniques;Therapeutic activities;Functional mobility training;Stair training;Gait training;DME Instruction;Balance training;Neuromuscular re-education;Patient/family education;Prosthetic Training;Scar mobilization   PT Next Visit Plan Review prosthetic care and residual lmb  management, assess wounds, prosthetic gait   Consulted and Agree with Plan of Care Patient      Patient will benefit from skilled therapeutic intervention in order to improve the following deficits and impairments:  Abnormal gait, Decreased activity tolerance, Decreased balance, Decreased mobility, Postural dysfunction, Decreased knowledge of use of DME, Decreased knowledge of precautions, Decreased endurance, Decreased safety awareness, Prosthetic Dependency, Pain, Improper body mechanics  Visit Diagnosis: Other abnormalities of gait and mobility  Unsteadiness on feet  Phantom limb syndrome with pain (HCC)  Other symptoms and signs involving the musculoskeletal system  Repeated falls     Problem List Patient Active Problem List   Diagnosis Date Noted  . Below knee amputation status (HCC) 12/06/2015  . PAD (peripheral artery disease) (HCC) 10/24/2015  . Diabetes mellitus with peripheral artery disease (HCC) 07/30/2014  . Claudication in peripheral vascular disease (HCC) 07/30/2014  . IDDM (insulin dependent diabetes mellitus) (HCC) 01/23/2013  . CAD (coronary artery disease) 04/15/2011  . Hyperlipidemia 04/15/2011   Rollene Fare, SPT 02/13/2016, 5:36 PM  Vladimir Faster, PT, DPT PT Specializing in Prosthetics & Orthotics 02/14/2016 2:40 PM Phone:  [phone]  Fax:  [phone] [facility] [address] [place], [place] [address]   [facility] [address] [place], [place], [address] Phone: [phone]   Fax:  [phone]  Name: [name] MRN: [id number] Date of Birth: [birth year]

## 2016-02-18 ENCOUNTER — Ambulatory Visit: Payer: BLUE CROSS/BLUE SHIELD | Admitting: Physical Therapy

## 2016-02-18 ENCOUNTER — Encounter: Payer: Self-pay | Admitting: Physical Therapy

## 2016-02-18 DIAGNOSIS — G546 Phantom limb syndrome with pain: Secondary | ICD-10-CM

## 2016-02-18 DIAGNOSIS — R2681 Unsteadiness on feet: Secondary | ICD-10-CM

## 2016-02-18 DIAGNOSIS — R29898 Other symptoms and signs involving the musculoskeletal system: Secondary | ICD-10-CM

## 2016-02-18 DIAGNOSIS — R2689 Other abnormalities of gait and mobility: Secondary | ICD-10-CM | POA: Diagnosis not present

## 2016-02-18 DIAGNOSIS — R296 Repeated falls: Secondary | ICD-10-CM

## 2016-02-19 NOTE — Therapy (Signed)
[facility] [address] [place], [place], [address] Phone: [phone]   Fax:  [phone]  Physical Therapy Treatment  Patient Details  Name: [name] MRN: [id number] Date of Birth: 1951/03/07 Referring Provider: Aldean Baker, MD  Encounter Date: 02/18/2016      PT End of Session - 02/19/16 2257    Visit Number 2   Number of Visits 21   Date for PT Re-Evaluation 04/24/16   Authorization Type [id number]   Authorization - Number of Visits 30   PT Start Time 0930   PT Stop Time 1016   PT Time Calculation (min) 46 min   Activity Tolerance Patient tolerated treatment well   Behavior During Therapy [facility] for tasks assessed/performed      Past Medical History  Diagnosis Date  . Coronary artery disease   . Peripheral vascular disease (HCC)   . Dyslipidemia   . Cigarette smoker   . Diabetes mellitus     type 1  x 50 yrs  . Diabetic retinopathy [facility])     Past Surgical History  Procedure Laterality Date  . Lower extremity angiogram N/A 01/23/2014    Procedure: LOWER EXTREMITY ANGIOGRAM;  Surgeon: Pamella Pert, MD;  Location: [facility] CATH LAB;  Service: Cardiovascular;  Laterality: N/A;  . Abdominal angiogram N/A 05/29/2014    Procedure: ABDOMINAL ANGIOGRAM;  Surgeon: Pamella Pert, MD;  Location: [facility] CATH LAB;  Service: Cardiovascular;  Laterality: N/A;  . Lower extremity angiogram N/A 07/31/2014    Procedure: LOWER EXTREMITY ANGIOGRAM;  Surgeon: Pamella Pert, MD;  Location: [facility] CATH LAB;  Service: Cardiovascular;  Laterality: N/A;  . Peripheral vascular catheterization Left 10/11/2015    Procedure: Peripheral Vascular Balloon Angioplasty;  Surgeon: Nada Libman, MD;  Location: [facility] INVASIVE CV LAB;  Service: Cardiovascular;  Laterality: Left;  sfa failed unable to cross occluded sfa  . Peripheral vascular catheterization N/A 10/11/2015    Procedure: Abdominal Aortogram;  Surgeon: Nada Libman, MD;  Location:  [facility] INVASIVE CV LAB;  Service: Cardiovascular;  Laterality: N/A;  . Lower extremity angiogram Left 10/11/2015    Procedure: Lower Extremity Angiogram;  Surgeon: Nada Libman, MD;  Location: [facility] INVASIVE CV LAB;  Service: Cardiovascular;  Laterality: Left;  . Cardiac catheterization      EF is 55-60% and no wall motion abnormalities (long long time ago)  . Fracture surgery      left arm "many yrs ago"  . Femoral-popliteal bypass graft Left 10/24/2015    Procedure: BYPASS GRAFT FEMORAL below knee POPLITEAL ARTERY with Left Saphenous Vein;  Surgeon: Nada Libman, MD;  Location: [facility] OR;  Service: Vascular;  Laterality: Left;  . Amputation Left 10/25/2015    Procedure: Left Foot 5th Ray Amputation;  Surgeon: Nadara Mustard, MD;  Location: [facility] OR;  Service: Orthopedics;  Laterality: Left;  . Amputation Left 12/06/2015    Procedure: AMPUTATION BELOW KNEE;  Surgeon: Nadara Mustard, MD;  Location: [facility] OR;  Service: Orthopedics;  Laterality: Left;    There were no vitals filed for this visit.      02/18/16 2325  Symptoms/Limitations  Subjective Pt to session today with use of no AD and reports he has been wearing prosthesis up to 4-5 hours at one time before taking a break. He also reports scatching his residual limb due to itch and making it bleed a day ago.   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 Yes  Pain Score 7  Pain Location Foot  Pain Orientation Left  Pain Descriptors / Indicators Stabbing;Aching  Pain Onset 1 to 4 weeks ago  Aggravating Factors  weight bearing with prosthesis  Pain Relieving Factors removing prosthesis    02/18/16 0001  Ambulation/Gait  Ambulation/Gait Yes  Ambulation/Gait Assistance 4: Min assist;4: Min guard;5: Supervision  Ambulation/Gait Assistance Details no increase in pain reported  with gait today. Pt did report feeling more comfortable using forearm crutches vs straight cane.  Ambulation Distance (Feet) (multiple laps of 120-350 feet )  Assistive device Lofstrands;Straight cane;Prosthesis  Gait Pattern Step-through pattern;Decreased stance time - left;Decreased step length - right;Left foot flat;Lateral trunk lean to right;Decreased arm swing - left;Decreased stride length;Decreased hip/knee flexion - left;Decreased weight shift to left;Left circumduction;Left flexed knee in stance;Antalgic;Abducted - left;Poor foot clearance - left  Ambulation Surface Level;Indoor  Gait Comments blocked practice on gait with fist single cane, then bil forearm crutches. With single point cane: pt with difficulty sequencing cane with weaker leg and needed max cues and min guard assist for balance. with forearm crutches: pt able to progress from min guard/min assist to supervision. needed cues on sequencing using 2 or 3 point gait pattern.                                                          Prosthetics  Prosthetic Care Comments  Reinforcec with pt new wearing schedule of 2 hrs on, 2 hrs off rotation to promote wound healing. also reinforced use of AD to off load prosthesis with gait to allow for optimal wound healing. Pt verbalized understanding.                                                       Current prosthetic wear tolerance (days/week)  daily  Current prosthetic wear tolerance (#hours/day)  wearing up to 5 hours at at time, sometimes more  Edema non pitting  Residual limb condition  pt now with two additional open areas along incision of residual limb: very lateral end measuring 0.7 cm in lenght (no depth and width unable to obtain good measurment as it's less than 0.1cm), another pin point opening at incision line at center point of incision line, dry bloody drainage visable here; established wounds: medial distal end- 0.9 cm depth, 0.4 cm length, and 0.2 cm in width, lateral wound-  0.6 cm depth, 1.3 cm length and 1.2 cm in width. odor present, along with moderate amount of serous sangunous drainange. Macerated edges noted. (pt did report having put on tegaderm/packing at night after bath and still with same dressing at PT today, this could be contributing to amount of drainage noted).                               Education Provided Skin check;Residual limb care;Correct ply sock adjustment;Proper Donning;Proper Doffing;Proper wear schedule/adjustment;Proper weight-bearing schedule/adjustment  Person(s) Educated Patient  Education Method Explanation;Demonstration;Verbal cues  Education Method  Verbalized understanding;Needs further instruction;Verbal cues required          PT Short Term Goals - 02/13/16 1645    PT SHORT TERM GOAL #1   Title Pt verbalizes proper wear schedule for wound management & proper cleaning and demonstrates proper donning of prosthesis. (Target Date: 03/19/16)   Time 5   Period Weeks   Status New   PT SHORT TERM GOAL #2   Title Pt will able to ambualte 300' including grass and pavement with LRAD & prosthesis with supervision (Target Date: 03/19/16)   Time 5   Period Weeks   Status New   PT SHORT TERM GOAL #3   Title Pt will be able to negoitate ramps, curbs and stairs with supervision with LRAD & prosthesis (Target Date: 03/19/16)   Time 5   Period Weeks   Status New   PT SHORT TERM GOAL #4   Title Pt will be able to reach 10" laterally, across midline, anteriorly and to the floor with no UE support utilizing prosthesis with supervision (Target Date: 03/19/16)   PT SHORT TERM GOAL #5   Title Patient reports pain increases <6 increments with standing & gait activities. (Target Date: 03/19/16)   Time 5   Period Weeks   Status New           PT Long Term Goals - 02/13/16 1607    PT LONG TERM GOAL #1   Title Pt will wear prosthesis >90 % of all awake hours with no new wounds to indicate increased fucntional use with prosthesis (Target  Date: 04/24/16)   Time 10   Period Weeks   Status New   PT LONG TERM GOAL #2   Title Pt will be indepedent in prosthetic and residual limb care to enable increased use of prosthesis to improve activity level  (Target Date: 04/24/16)   Time 10   Period Weeks   Status New   PT LONG TERM GOAL #3   Title Pt will be able to ambulate >1000' modified independent with prosthesis only including grass, pavement and level surfaces to indicate 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 (Target Date: 04/24/16)   Time 10   Period Weeks   Status New   PT LONG TERM GOAL #4   Title Pt will be able to negoitate ramps, curbs and stairs modfied indepedent with prosthesis only to enable increased community access (Target Date: 04/24/16)   Time 10   Period Weeks   Status New   PT LONG TERM GOAL #5   Title Pt will be improve Fuctional Gait Assessment will improve from 12/30 to >/= 24/30 to indicate decreased risk for falls (Target Date: 04/24/16)   Time 10   Period Weeks   Status New   Additional Long Term Goals   Additional Long Term Goals Yes   PT LONG TERM GOAL #6   Title Pt will increase Berg Balance Score from 40/56 to >/= 51/56 to indicate a decreased risk for falls (Target Date: 04/24/16)   Time 10   Period Weeks   Status New   PT LONG TERM GOAL #7   Title Patient reports pain increases < 3 increments with standing & gait activities. (Target Date: 04/24/16)   Time 10   Period Weeks   Status New               Plan - 02/19/16 2257    Clinical Impression Statement Pt has continued to wear his prosthesis over the instructed  time and ambulate without a cane since eval, now presenting with increased wounds on residual limb. Primary PT notified and over to assess wounds, did state they looked worse than at evaluation. Measurments take again today, refer to treatment notes for full details. Reinforced importance of decreased wear with breaks to allow wounds to be open to air to  dry out and heal. Pt appears to have a better understanding of why he needs to decrease wear and weight bearing at this time to allow wounds to heal. Pt educated on use of bil forearm crutches today and progressed to safe level to use them outside of therapy. Pt planning to purchase a pair today to use so to off load prosthesis with gait. Pt is making slow, steady progress towars goals.                                                 Rehab Potential Good   Clinical Impairments Affecting Rehab Potential 4 wounds on residual limb with Type 1 Diabetes > 50 years   PT Frequency 2x / week   PT Duration Other (comment)  2x/week for 10 weeks   PT Treatment/Interventions ADLs/Self Care Home Management;Therapeutic exercise;Manual techniques;Therapeutic activities;Functional mobility training;Stair training;Gait training;DME Instruction;Balance training;Neuromuscular re-education;Patient/family education;Prosthetic Training;Scar mobilization   PT Next Visit Plan Review prosthetic care and residual lmb management, assess wounds, prosthetic gait, sink HEP   Consulted and Agree with Plan of Care Patient      Patient will benefit from skilled therapeutic intervention in order to improve the following deficits and impairments:  Abnormal gait, Decreased activity tolerance, Decreased balance, Decreased mobility, Postural dysfunction, Decreased knowledge of use of DME, Decreased knowledge of precautions, Decreased endurance, Decreased safety awareness, Prosthetic Dependency, Pain, Improper body mechanics  Visit Diagnosis: Other abnormalities of gait and mobility  Unsteadiness on feet  Phantom limb syndrome with pain (HCC)  Other symptoms and signs involving the musculoskeletal system  Repeated falls     Problem List Patient Active Problem List   Diagnosis Date Noted  . Below knee amputation status (HCC) 12/06/2015  . PAD (peripheral artery disease) (HCC) 10/24/2015  . Diabetes mellitus with peripheral  artery disease (HCC) 07/30/2014  . Claudication in peripheral vascular disease (HCC) 07/30/2014  . IDDM (insulin dependent diabetes mellitus) (HCC) 01/23/2013  . CAD (coronary artery disease) 04/15/2011  . Hyperlipidemia 04/15/2011   Sallyanne KusterKathy Orley Lawry, PTA, [facility] [address], [address] [place], [place] [address] [phone] 02/19/2016, 11:04 PM   Name: [name] MRN: [id number] Date of Birth: 05/27/1951

## 2016-02-20 ENCOUNTER — Encounter: Payer: Self-pay | Admitting: Physical Therapy

## 2016-02-20 ENCOUNTER — Ambulatory Visit: Payer: BLUE CROSS/BLUE SHIELD | Admitting: Physical Therapy

## 2016-02-20 DIAGNOSIS — R2689 Other abnormalities of gait and mobility: Secondary | ICD-10-CM | POA: Diagnosis not present

## 2016-02-20 DIAGNOSIS — R2681 Unsteadiness on feet: Secondary | ICD-10-CM

## 2016-02-20 DIAGNOSIS — R296 Repeated falls: Secondary | ICD-10-CM

## 2016-02-20 DIAGNOSIS — R29898 Other symptoms and signs involving the musculoskeletal system: Secondary | ICD-10-CM

## 2016-02-20 DIAGNOSIS — G546 Phantom limb syndrome with pain: Secondary | ICD-10-CM

## 2016-02-20 NOTE — Patient Instructions (Signed)

## 2016-02-20 NOTE — Therapy (Signed)
[facility] [address] [place], [place], [address] Phone: [phone]   Fax:  [phone]  Physical Therapy Treatment  Patient Details  Name: [name] BostonStephen L Clinard MRN: [id number] Date of Birth: 1951-05-07 Referring Provider: Aldean BakerMarcus Duda, MD  Encounter Date: 02/20/2016      PT End of Session - 02/20/16 0846    Visit Number 3   Number of Visits 21   Date for PT Re-Evaluation 04/24/16   Authorization Type [id number]   Authorization - Number of Visits 30   PT Start Time 0802   PT Stop Time 0847   PT Time Calculation (min) 45 min   Activity Tolerance Patient tolerated treatment well   Behavior During Therapy [facility] for tasks assessed/performed      Past Medical History  Diagnosis Date  . Coronary artery disease   . Peripheral vascular disease (HCC)   . Dyslipidemia   . Cigarette smoker   . Diabetes mellitus     type 1  x 50 yrs  . Diabetic retinopathy [facility](HCC)     Past Surgical History  Procedure Laterality Date  . Lower extremity angiogram N/A 01/23/2014    Procedure: LOWER EXTREMITY ANGIOGRAM;  Surgeon: Pamella PertJagadeesh R Ganji, MD;  [facility];  Service: Cardiovascular;  Laterality: N/A;  . Abdominal angiogram N/A 05/29/2014    Procedure: ABDOMINAL ANGIOGRAM;  Surgeon: Pamella PertJagadeesh R Ganji, MD;  [facility];  Service: Cardiovascular;  Laterality: N/A;  . Lower extremity angiogram N/A 07/31/2014    Procedure: LOWER EXTREMITY ANGIOGRAM;  Surgeon: Pamella PertJagadeesh R Ganji, MD;  [facility];  Service: Cardiovascular;  Laterality: N/A;  . Peripheral vascular catheterization Left 10/11/2015    Procedure: Peripheral Vascular Balloon Angioplasty;  Surgeon: Nada LibmanVance W Brabham, MD;  Location: [facility] INVASIVE CV LAB;  Service: Cardiovascular;  Laterality: Left;  sfa failed unable to cross occluded sfa  . Peripheral vascular catheterization N/A 10/11/2015    Procedure: Abdominal Aortogram;  Surgeon: Nada LibmanVance W Brabham, MD;  Location:  [facility] INVASIVE CV LAB;  Service: Cardiovascular;  Laterality: N/A;  . Lower extremity angiogram Left 10/11/2015    Procedure: Lower Extremity Angiogram;  Surgeon: Nada LibmanVance W Brabham, MD;  [facility];  Service: Cardiovascular;  Laterality: Left;  . Cardiac catheterization      EF is 55-60% and no wall motion abnormalities (long long time ago)  . Fracture surgery      left arm "many yrs ago"  . Femoral-popliteal bypass graft Left 10/24/2015    Procedure: BYPASS GRAFT FEMORAL below knee POPLITEAL ARTERY with Left Saphenous Vein;  Surgeon: Nada LibmanVance W Brabham, MD;  [facility];  Service: Vascular;  Laterality: Left;  . Amputation Left 10/25/2015    Procedure: Left Foot 5th Ray Amputation;  Surgeon: Nadara MustardMarcus Duda V, MD;  [facility];  Service: Orthopedics;  Laterality: Left;  . Amputation Left 12/06/2015    Procedure: AMPUTATION BELOW KNEE;  Surgeon: Nadara MustardMarcus Duda V, MD;  Location: [facility] OR;  Service: Orthopedics;  Laterality: Left;    There were no vitals filed for this visit.      Subjective Assessment - 02/20/16 0816    Subjective Pt to clinic today with single forearm crutch. Reports he has been following the proper wear schedule and even stayed off it most of yesterday.   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 No/denies   Pain Score 0-No pain              [facility] Adult PT Treatment/Exercise - 02/20/16 0905    Transfers   Transfers Sit to Stand;Stand to Sit   Sit to Stand 5: Supervision;With upper extremity assist;With armrests;From chair/3-in-1   Sit to Stand Details Verbal cues for safe use of DME/AE;Verbal cues for technique;Verbal cues for sequencing   Sit to Stand Details (indicate cue type and reason) cues for standing with forearm crutch/es   Stand to Sit 5: Supervision;With upper extremity assist;With  armrests;To chair/3-in-1   Stand to Sit Details (indicate cue type and reason) Verbal cues for precautions/safety;Verbal cues for safe use of DME/AE   Stand to Sit Details cues on sitting with forearm crutch/es   Ambulation/Gait   Ambulation/Gait Yes   Ambulation/Gait Assistance 4: Min guard;5: Supervision   Ambulation/Gait Assistance Details pt initiatlly using crutch on prosthetic side on arrival. with cues, had pt switch to other side. with practice/cues pt progressed from min guard assist to supervision                  Ambulation Distance (Feet) 350 Feet   Assistive device Lofstrands;Prosthesis  single forearm crutch   Gait Pattern Step-through pattern;Decreased stance time - left;Decreased step length - right;Left foot flat;Lateral trunk lean to right;Decreased arm swing - left;Decreased stride length;Decreased hip/knee flexion - left;Decreased weight shift to left;Left circumduction;Left flexed knee in stance;Antalgic;Abducted - left;Poor foot clearance - left   Ambulation Surface Level;Indoor   Prosthetics   Current prosthetic wear tolerance (days/week)  daily   Current prosthetic wear tolerance (#hours/day)  2 hours on, 2 hours off   Edema non pitting   Residual limb condition  no new wounds noted, current wounds appear less irritated today. no drainage noted.  wound on medial end with clean dressing includeing packing and tegaderm, did not remove. pt unable to pack wound on lateral end due to small size and no one at home to assist him, removed the tegaderm and packed wound with gauze, applied clean tegaderm.                                          Education Provided Skin check;Residual limb care;Correct ply sock adjustment;Proper Donning;Proper Doffing;Proper wear schedule/adjustment;Proper weight-bearing schedule/adjustment   Person(s) Educated Patient   Education Method Explanation;Demonstration;Verbal cues   Education Method Verbalized understanding;Verbal cues required;Needs  further instruction            PT Education - 02/20/16 [phone]    Education provided Yes   Education Details wound management, sink HEP for proprioception/balance   Person(s) Educated Patient   Methods Explanation;Demonstration;Verbal cues;Handout   Comprehension Verbalized understanding;Returned demonstration;Need further instruction          PT Short Term Goals - 02/13/16 1645    PT SHORT TERM GOAL #1   Title Pt verbalizes proper wear schedule for wound management & proper cleaning and demonstrates proper donning of prosthesis. (Target Date: 03/19/16)   Time 5   Period Weeks   Status New   PT SHORT TERM GOAL #2   Title Pt will able to ambualte 300' including grass and pavement with LRAD & prosthesis with supervision (Target Date: 03/19/16)   Time 5   Period Weeks   Status New   PT SHORT TERM GOAL #3  Title Pt will be able to negoitate ramps, curbs and stairs with supervision with LRAD & prosthesis (Target Date: 03/19/16)   Time 5   Period Weeks   Status New   PT SHORT TERM GOAL #4   Title Pt will be able to reach 10" laterally, across midline, anteriorly and to the floor with no UE support utilizing prosthesis with supervision (Target Date: 03/19/16)   PT SHORT TERM GOAL #5   Title Patient reports pain increases <6 increments with standing & gait activities. (Target Date: 03/19/16)   Time 5   Period Weeks   Status New           PT Long Term Goals - 02/13/16 1607    PT LONG TERM GOAL #1   Title Pt will wear prosthesis >90 % of all awake hours with no new wounds to indicate increased fucntional use with prosthesis (Target Date: 04/24/16)   Time 10   Period Weeks   Status New   PT LONG TERM GOAL #2   Title Pt will be indepedent in prosthetic and residual limb care to enable increased use of prosthesis to improve activity level  (Target Date: 04/24/16)   Time 10   Period Weeks   Status New   PT LONG TERM GOAL #3   Title Pt will be able to ambulate >1000'  modified independent with prosthesis only including grass, pavement and level surfaces to indicate 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 (Target Date: 04/24/16)   Time 10   Period Weeks   Status New   PT LONG TERM GOAL #4   Title Pt will be able to negoitate ramps, curbs and stairs modfied indepedent with prosthesis only to enable increased community access (Target Date: 04/24/16)   Time 10   Period Weeks   Status New   PT LONG TERM GOAL #5   Title Pt will be improve Fuctional Gait Assessment will improve from 12/30 to >/= 24/30 to indicate decreased risk for falls (Target Date: 04/24/16)   Time 10   Period Weeks   Status New   Additional Long Term Goals   Additional Long Term Goals Yes   PT LONG TERM GOAL #6   Title Pt will increase Berg Balance Score from 40/56 to >/= 51/56 to indicate a decreased risk for falls (Target Date: 04/24/16)   Time 10   Period Weeks   Status New   PT LONG TERM GOAL #7   Title Patient reports pain increases < 3 increments with standing & gait activities. (Target Date: 04/24/16)   Time 10   Period Weeks   Status New            Plan - 02/20/16 0846    Clinical Impression Statement Pt's wounds appear to be healing as compared with previous session this week. Pt also reports increased compliance with wear times and use of AD to off load limb to allow for healing of wounds. Pt educated on sink HEP today for proproception and balance without any issues reported.   Rehab Potential Good   Clinical Impairments Affecting Rehab Potential 4 wounds on residual limb with Type 1 Diabetes > 50 years   PT Frequency 2x / week   PT Duration Other (comment)  2x/week for 10 weeks   PT Treatment/Interventions ADLs/Self Care Home Management;Therapeutic exercise;Manual techniques;Therapeutic activities;Functional mobility training;Stair training;Gait training;DME Instruction;Balance training;Neuromuscular re-education;Patient/family education;Prosthetic  Training;Scar mobilization   PT Next Visit Plan check wounds, continue with prosthetic gait including barriers, educated  pt on water seal bag for water with prosthetics and on ladder steps/rungs for simulation of climbing onto tractors/farm equipment   Consulted and Agree with Plan of Care Patient      Patient will benefit from skilled therapeutic intervention in order to improve the following deficits and impairments:  Abnormal gait, Decreased activity tolerance, Decreased balance, Decreased mobility, Postural dysfunction, Decreased knowledge of use of DME, Decreased knowledge of precautions, Decreased endurance, Decreased safety awareness, Prosthetic Dependency, Pain, Improper body mechanics  Visit Diagnosis: Other abnormalities of gait and mobility  Unsteadiness on feet  Phantom limb syndrome with pain (HCC)  Other symptoms and signs involving the musculoskeletal system  Repeated falls     Problem List Patient Active Problem List   Diagnosis Date Noted  . Below knee amputation status (HCC) 12/06/2015  . PAD (peripheral artery disease) (HCC) 10/24/2015  . Diabetes mellitus with peripheral artery disease (HCC) 07/30/2014  . Claudication in peripheral vascular disease (HCC) 07/30/2014  . IDDM (insulin dependent diabetes mellitus) (HCC) 01/23/2013  . CAD (coronary artery disease) 04/15/2011  . Hyperlipidemia 04/15/2011    Sallyanne KusterKathy Alfredia Desanctis, PTA, [facility] [address], [address] [place], [place] [address] [phone] 02/20/2016, 9:28 AM   Name: [name] BostonStephen L [name] MRN: [id number] Date of Birth: 09-05-1950

## 2016-02-25 ENCOUNTER — Encounter (HOSPITAL_COMMUNITY): Payer: Self-pay | Admitting: Vascular Surgery

## 2016-02-25 ENCOUNTER — Emergency Department (HOSPITAL_COMMUNITY)
Admission: EM | Admit: 2016-02-25 | Discharge: 2016-02-25 | Disposition: A | Discharge status: Home / Self Care | Payer: BLUE CROSS/BLUE SHIELD | Attending: Emergency Medicine | Admitting: Emergency Medicine

## 2016-02-25 ENCOUNTER — Emergency Department (HOSPITAL_COMMUNITY): Payer: BLUE CROSS/BLUE SHIELD

## 2016-02-25 DIAGNOSIS — R0789 Other chest pain: Secondary | ICD-10-CM | POA: Insufficient documentation

## 2016-02-25 DIAGNOSIS — Z7982 Long term (current) use of aspirin: Secondary | ICD-10-CM | POA: Diagnosis not present

## 2016-02-25 DIAGNOSIS — E10319 Type 1 diabetes mellitus with unspecified diabetic retinopathy without macular edema: Secondary | ICD-10-CM | POA: Diagnosis not present

## 2016-02-25 DIAGNOSIS — F17210 Nicotine dependence, cigarettes, uncomplicated: Secondary | ICD-10-CM | POA: Diagnosis not present

## 2016-02-25 DIAGNOSIS — Y939 Activity, unspecified: Secondary | ICD-10-CM | POA: Insufficient documentation

## 2016-02-25 DIAGNOSIS — Z79899 Other long term (current) drug therapy: Secondary | ICD-10-CM | POA: Insufficient documentation

## 2016-02-25 DIAGNOSIS — M79609 Pain in unspecified limb: Secondary | ICD-10-CM

## 2016-02-25 DIAGNOSIS — Y999 Unspecified external cause status: Secondary | ICD-10-CM | POA: Insufficient documentation

## 2016-02-25 DIAGNOSIS — T8789 Other complications of amputation stump: Secondary | ICD-10-CM

## 2016-02-25 DIAGNOSIS — W19XXXA Unspecified fall, initial encounter: Secondary | ICD-10-CM | POA: Diagnosis not present

## 2016-02-25 DIAGNOSIS — I2510 Atherosclerotic heart disease of native coronary artery without angina pectoris: Secondary | ICD-10-CM | POA: Diagnosis not present

## 2016-02-25 DIAGNOSIS — G8918 Other acute postprocedural pain: Secondary | ICD-10-CM | POA: Diagnosis not present

## 2016-02-25 DIAGNOSIS — Y929 Unspecified place or not applicable: Secondary | ICD-10-CM | POA: Diagnosis not present

## 2016-02-25 MED ORDER — NAPROXEN 250 MG PO TABS
500.0000 mg | ORAL_TABLET | Freq: Once | ORAL | Status: AC
  Administered 2016-02-25: 500 mg via ORAL
  Filled 2016-02-25: qty 2

## 2016-02-25 MED ORDER — ACETAMINOPHEN 500 MG PO TABS
1000.0000 mg | ORAL_TABLET | Freq: Once | ORAL | Status: AC
Start: 2016-02-25 — End: 2016-02-25
  Administered 2016-02-25: 1000 mg via ORAL
  Filled 2016-02-25: qty 2

## 2016-02-25 MED ORDER — MORPHINE SULFATE 15 MG PO TABS: 15.0000 mg | ORAL_TABLET | ORAL | Status: DC | PRN

## 2016-02-25 MED ORDER — OXYCODONE HCL 5 MG PO TABS
5.0000 mg | ORAL_TABLET | Freq: Once | ORAL | Status: AC
  Administered 2016-02-25: 5 mg via ORAL
  Filled 2016-02-25: qty 1

## 2016-02-25 NOTE — Discharge Instructions (Signed)
Take 4 over the counter ibuprofen tablets 3 times a day or 2 over-the-counter naproxen tablets twice a day for pain. Also take tylenol 1000mg (2 extra strength) four times a day.   Then take the pain medicine if you feel like you need it. Narcotics do not help with the pain, they only make you care about it less.  You can become addicted to this, people may break into your house to steal it.  It will constipate you.  If you drive under the influence of this medicine you can get a DUI.     Chest Wall Pain Chest wall pain is pain in or around the bones and muscles of your chest. Sometimes, an injury causes this pain. Sometimes, the cause may not be known. This pain may take several weeks or longer to get better. HOME CARE INSTRUCTIONS  Pay attention to any changes in your symptoms. Take these actions to help with your pain:   Rest as told by your health care provider.   Avoid activities that cause pain. These include any activities that use your chest muscles or your abdominal and side muscles to lift heavy items.   If directed, apply ice to the painful area:  Put ice in a plastic bag.  Place a towel between your skin and the bag.  Leave the ice on for 20 minutes, 2-3 times per day.  Take over-the-counter and prescription medicines only as told by your health care provider.  Do not use tobacco products, including cigarettes, chewing tobacco, and e-cigarettes. If you need help quitting, ask your health care provider.  Keep all follow-up visits as told by your health care provider. This is important. SEEK MEDICAL CARE IF:  You have a fever.  Your chest pain becomes worse.  You have new symptoms. SEEK IMMEDIATE MEDICAL CARE IF:  You have nausea or vomiting.  You feel sweaty or light-headed.  You have a cough with phlegm (sputum) or you cough up blood.  You develop shortness of breath.   This information is not intended to replace advice given to you by your health care  provider. Make sure you discuss any questions you have with your health care provider.   Document Released: 08/10/2005 Document Revised: 05/01/2015 Document Reviewed: 11/05/2014 Elsevier Interactive Patient Education [employer]2016 [employer]

## 2016-02-25 NOTE — ED Provider Notes (Signed)
CSN: [id number]     Arrival date & time 02/25/16  [id number] History   First MD Initiated Contact with Patient 02/25/16 [phone]     Chief Complaint  Patient presents with  . Fall     (Consider location/radiation/quality/duration/timing/severity/associated sxs/prior Treatment) Patient is a 65 y.o. male presenting with chest pain. The history is provided by the patient.  Chest Pain Pain location:  R lateral chest Pain quality: sharp and shooting   Pain radiates to:  Does not radiate Pain radiates to the back: no   Pain severity:  Severe Onset quality:  Sudden Duration:  1 day Timing:  Constant Progression:  Worsening Chronicity:  New Context: breathing, lifting and raising an arm   Relieved by:  Nothing Worsened by:  Nothing tried Ineffective treatments:  None tried Associated symptoms: no abdominal pain, no fever, no headache, no palpitations, no shortness of breath and not vomiting    65 yo M With right-sided chest wall pain. This started yesterday after he fell in the morning. Patient states he has a history of hypoglycemia early in the morning. He thinks is the event that occurred he landed on his chest. Complaining of right-sided chest wall pain as well as left stump pain. Patient thinks of the stump is hurting him more than normal. Describes it as a burning. He does not think this feels like his chronic phantom pain. He denies any new skin lesions to the area. Denies any worsening of his chronic skin lesions. Has had some subjective chills denies fevers. Denies cough congestion.  Past Medical History  Diagnosis Date  . Coronary artery disease   . Peripheral vascular disease (HCC)   . Dyslipidemia   . Cigarette smoker   . Diabetes mellitus     type 1  x 50 yrs  . Diabetic retinopathy [facility](HCC)    Past Surgical History  Procedure Laterality Date  . Lower extremity angiogram N/A 01/23/2014    Procedure: LOWER EXTREMITY ANGIOGRAM;  Surgeon: Pamella PertJagadeesh R Ganji, MD;  [facility];   Service: Cardiovascular;  Laterality: N/A;  . Abdominal angiogram N/A 05/29/2014    Procedure: ABDOMINAL ANGIOGRAM;  Surgeon: Pamella PertJagadeesh R Ganji, MD;  [facility];  Service: Cardiovascular;  Laterality: N/A;  . Lower extremity angiogram N/A 07/31/2014    Procedure: LOWER EXTREMITY ANGIOGRAM;  Surgeon: Pamella PertJagadeesh R Ganji, MD;  [facility];  Service: Cardiovascular;  Laterality: N/A;  . Peripheral vascular catheterization Left 10/11/2015    Procedure: Peripheral Vascular Balloon Angioplasty;  Surgeon: Nada LibmanVance W Brabham, MD;  [facility]: [facility] INVASIVE CV LAB;  Service: Cardiovascular;  Laterality: Left;  sfa failed unable to cross occluded sfa  . Peripheral vascular catheterization N/A 10/11/2015    Procedure: Abdominal Aortogram;  Surgeon: Nada LibmanVance W Brabham, MD;  [facility]: [facility] INVAS[facility] CV LAB;  Service: Cardiovascular;  Laterality: N/A;  . Lower extremity angiogram Left 10/11/2015    Procedure: Lower Extremity Angiogram;  Surgeon: Nada LibmanVance W Brabham, MD;  [facility];  Service: Cardiovascular;  Laterality: Left;  . Cardiac catheterization      EF is 55-60% and no wall motion abnormalities (long long time ago)  . Fracture surgery      left arm "many yrs ago"  . Femoral-popliteal bypass graft Left 10/24/2015    Procedure: BYPASS GRAFT FEMORAL below knee POPLITEAL ARTERY with Left Saphenous Vein;  Surgeon: Nada LibmanVance W Brabham, MD;  [facility];  Service: Vascular;  Laterality: Left;  . Amputation Left 10/25/2015  Procedure: Left Foot 5th Ray Amputation;  Surgeon: Nadara MustardMarcus Duda V, MD;  [facility];  Service: Orthopedics;  Laterality: Left;  . Amputation Left 12/06/2015    Procedure: AMPUTATION BELOW KNEE;  Surgeon: Nadara MustardMarcus Duda V, MD;  Location: [facility] OR;  Service: Orthopedics;  Laterality: Left;   Family History  Problem Relation Age of Onset  . Coronary artery disease Mother    Social History  Substance Use Topics  . Smoking status: Current Every Day Smoker -- 0.50 packs/day for  30 years    Types: Cigarettes  . Smokeless tobacco: Never Used  . Alcohol Use: Yes     Comment: on occasion wine    Review of Systems  Constitutional: Negative for fever and chills.  HENT: Negative for congestion and facial swelling.   Eyes: Negative for discharge and visual disturbance.  Respiratory: Negative for shortness of breath.   Cardiovascular: Positive for chest pain. Negative for palpitations.  Gastrointestinal: Negative for vomiting, abdominal pain and diarrhea.  Musculoskeletal: Positive for myalgias and arthralgias.  Skin: Negative for color change and rash.  Neurological: Negative for tremors, syncope and headaches.  Psychiatric/Behavioral: Negative for confusion and dysphoric mood.      Allergies  Simvastatin  Home Medications   Prior to Admission medications   Medication Sig Start Date End Date Taking? Authorizing Provider  acetaminophen (TYLENOL) 500 MG tablet Take 1,000 mg by mouth 2 (two) times daily as needed (pain).    Yes Historical Provider, MD  aspirin EC 81 MG tablet Take 243 mg by mouth daily.    Yes Historical Provider, MD  baclofen (LIORESAL) 20 MG tablet Take 20 mg by mouth daily as needed (for leg cramps). Reported on 12/05/2015 09/09/15  Yes Historical Provider, MD  calcium carbonate (TUMS - DOSED IN MG ELEMENTAL CALCIUM) 500 MG chewable tablet Chew 2 tablets by mouth every 6 (six) hours as needed for indigestion or heartburn.   Yes Historical Provider, MD  cilostazol (PLETAL) 100 MG tablet Take 1 tablet (100 mg total) by mouth 2 (two) times daily. Patient taking differently: Take 100 mg by mouth daily.  07/31/14  Yes Yates DecampJay Ganji, MD  folic acid (FOLVITE) 800 MCG tablet Take 1,600 mcg by mouth daily.    Yes Historical Provider, MD  metoprolol succinate (TOPROL-XL) 25 MG 24 hr tablet Take 1 tablet (25 mg total) by mouth daily. 02/14/13  Yes Vesta MixerPhilip J Nahser, MD  Multiple Vitamins-Minerals (MULTIVITAMIN GUMMIES ADULT PO) Take 2 tablets by mouth daily.    Yes  Historical Provider, MD  NOVOLOG 100 UNIT/ML injection Inject into the skin continuous. Via insulin pump 02/11/16  Yes Historical Provider, MD  oxyCODONE-acetaminophen (PERCOCET/ROXICET) 5-325 MG tablet Take 1 tablet by mouth every 4 (four) hours as needed for severe pain. Reported on 02/13/2016   Yes Historical Provider, MD  pravastatin (PRAVACHOL) 40 MG tablet Take 40 mg by mouth at bedtime.  12/11/13  Yes Historical Provider, MD  ramipril (ALTACE) 10 MG tablet Take 10 mg by mouth daily.     Yes Historical Provider, MD  morphine (MSIR) 15 MG tablet Take 1 tablet (15 mg total) by mouth every 4 (four) hours as needed for severe pain. 02/25/16   Melene Planan Acea Yagi, DO   BP 150/61 mmHg  Pulse 83  Temp(Src) 98.2 F (36.8 C) (Oral)  Resp 16  SpO2 97% Physical Exam  Constitutional: He is oriented to person, place, and time. He appears well-developed and well-nourished.  HENT:  Head: Normocephalic and atraumatic.  Eyes: EOM are  normal. Pupils are equal, round, and reactive to light.  Neck: Normal range of motion. Neck supple. No JVD present.  Cardiovascular: Normal rate and regular rhythm.  Exam reveals no gallop and no friction rub.   No murmur heard. Pulmonary/Chest: No respiratory distress. He has no wheezes.  Abdominal: He exhibits no distension. There is no rebound and no guarding.  Musculoskeletal: Normal range of motion. He exhibits tenderness.  Tender palpation about the right chest wall midclavicular and lateral clavicular line. No signs of trauma. Patient also having tenderness all about his left BKA.  Neurological: He is alert and oriented to person, place, and time.  Skin: No rash noted. No pallor.  Psychiatric: He has a normal mood and affect. His behavior is normal.  Nursing note and vitals reviewed.   ED Course  Procedures (including critical care time) Labs Review Labs Reviewed - No data to display  Imaging Review Dg Ribs Unilateral W/chest Right  02/25/2016  CLINICAL DATA:  Fall 1  day ago. Right lateral upper to mid rib pain. EXAM: RIGHT RIBS AND CHEST - 3+ VIEW COMPARISON:  12/06/2015 FINDINGS: Heart and mediastinal contours are within normal limits. No focal opacities or effusions. No acute bony abnormality. No visible rib fracture. No pneumothorax. IMPRESSION: No active cardiopulmonary disease. Electronically Signed   By: Charlett Nose M.D.   On: 02/25/2016 09:52   Dg Tibia/fibula Left  02/25/2016  CLINICAL DATA:  Fall 1 day ago, left leg pain EXAM: LEFT TIBIA AND FIBULA - 2 VIEW COMPARISON:  None. FINDINGS: Two views of the left tibia and fibula submitted. The patient is status post amputation of mid and distal tibia and fibula. The remaining proximal tibia and fibula is unremarkable. No acute fracture or subluxation. Atherosclerotic calcifications of femoral and popliteal artery. Mild narrowing of medial joint compartment left knee joint. IMPRESSION: The patient is status post amputation of mid and distal tibia and fibula. The remaining proximal tibia and fibula is unremarkable. No acute fracture or subluxation. Electronically Signed   By: Natasha Mead M.D.   On: 02/25/2016 09:53   I have personally reviewed and evaluated these images and lab results as part of my medical decision-making.   EKG Interpretation None      MDM   Final diagnoses:  Chest wall pain  Stump pain [facility])    65 yo M With a chief complaint of right-sided chest wall pain. Patient is in no acute distress on my exam. His not tachypnea is afebrile. We'll obtain a rib series as well as a left tib-fib. Treat pain. Patient may have a broken rib versus a chest contusion. He has no history of narcotic use and the drug database. Give him a small amount of narcotics be follow-up.  Images negative.   10:32 AM:  I have discussed the diagnosis/risks/treatment options with the patient and family and believe the pt to be eligible for discharge home to follow-up with PCP. We also discussed returning to the ED  immediately if new or worsening sx occur. We discussed the sx which are most concerning (e.g., sudden worsening pain, fever, inability to tolerate by mouth) that necessitate immediate return. Medications administered to the patient during their visit and any new prescriptions provided to the patient are listed below.  Medications given during this visit Medications  acetaminophen (TYLENOL) tablet 1,000 mg (1,000 mg Oral Given 02/25/16 0924)  naproxen (NAPROSYN) tablet 500 mg (500 mg Oral Given 02/25/16 0924)  oxyCODONE (Oxy IR/ROXICODONE) immediate release tablet 5 mg (5  mg Oral Given 02/25/16 0925)    New Prescriptions   MORPHINE (MSIR) 15 MG TABLET    Take 1 tablet (15 mg total) by mouth every 4 (four) hours as needed for severe pain.    The patient appears reasonably screen and/or stabilized for discharge and I doubt any other medical condition or other [facility] requiring further screening, evaluation, or treatment in the ED at this time prior to discharge.       Melene Plan, DO 02/25/16 1032

## 2016-02-25 NOTE — ED Notes (Signed)
Pt reports to the ED following a fall that occurred yesterday. Reports he feels like he was extremely hypoglycemic and he sat up on the side of the bed and he had a syncopal episode. Pt reports pain to his right chest where he hit his chest on a bedside stand and his left stump site is painful from where he hit it on the floor. Is status post left BKA 2 weeks ago. He also has some severe phantom pain. Pt has tried Extra Strength Tylenol and Gabapentin with minimal relief. Pt denies any head injury. Pt denies any SOB. Pt A&Ox4, resp e/u, and skin warm and dry.

## 2016-02-25 NOTE — ED Notes (Signed)
Patient transported to X-ray 

## 2016-02-26 ENCOUNTER — Encounter: Payer: Self-pay | Admitting: Physical Therapy

## 2016-02-26 ENCOUNTER — Ambulatory Visit: Payer: BLUE CROSS/BLUE SHIELD | Attending: Family | Admitting: Physical Therapy

## 2016-02-26 DIAGNOSIS — R296 Repeated falls: Secondary | ICD-10-CM | POA: Diagnosis present

## 2016-02-26 DIAGNOSIS — R2689 Other abnormalities of gait and mobility: Secondary | ICD-10-CM | POA: Insufficient documentation

## 2016-02-26 DIAGNOSIS — G546 Phantom limb syndrome with pain: Secondary | ICD-10-CM | POA: Diagnosis present

## 2016-02-27 NOTE — Therapy (Signed)
[facility] [address] [place], [place], [address] Phone: [phone]   Fax:  [phone]  Physical Therapy Treatment  Patient Details  Name: [name] MRN: [id number] Date of Birth: 09-16-1950 Referring Provider: Aldean BakerMarcus Duda, MD  Encounter Date: 02/26/2016      PT End of Session - 02/26/16 1445    Visit Number 4   Number of Visits 21   Date for PT Re-Evaluation 04/24/16   Authorization Type [employer]   Authorization - Number of Visits 30   PT Start Time 1400   PT Stop Time 1444   PT Time Calculation (min) 44 min   Activity Tolerance Patient tolerated treatment well   Behavior [facility] for tasks assessed/performed      Past Medical History  Diagnosis Date  . Coronary artery disease   . Peripheral vascular disease (HCC)   . Dyslipidemia   . Cigarette smoker   . Diabetes mellitus     type 1  x 50 yrs  . Diabetic retinopathy [facility](HCC)     Past Surgical History  Procedure Laterality Date  . Lower extremity angiogram N/A 01/23/2014    Procedure: LOWER EXTREMITY ANGIOGRAM;  Surgeon: Pamella PertJagadeesh R Ganji, MD;  [facility];  Service: Cardiovascular;  Laterality: N/A;  . Abdominal angiogram N/A 05/29/2014    Procedure: ABDOMINAL ANGIOGRAM;  Surgeon: Pamella PertJagadeesh R Ganji, MD;  [facility];  Service: Cardiovascular;  Laterality: N/A;  . Lower extremity angiogram N/A 07/31/2014    Procedure: LOWER EXTREMITY ANGIOGRAM;  Surgeon: Pamella PertJagadeesh R Ganji, MD;  [facility];  Service: Cardiovascular;  Laterality: N/A;  . Peripheral vascular catheterization Left 10/11/2015    Procedure: Peripheral Vascular Balloon Angioplasty;  Surgeon: Nada LibmanVance W Brabham, MD;  [facility]: [facility] INVASIVE CV LAB;  Service: Cardiovascular;  Laterality: Left;  sfa failed unable to cross occluded sfa  . Peripheral vascular catheterization N/A 10/11/2015    Procedure: Abdominal Aortogram;  Surgeon: Nada LibmanVance W Brabham, MD;  [facility]: [facility]  INVASIVE CV LAB;  Service: Cardiovascular;  Laterality: N/A;  . Lower extremity angiogram Left 10/11/2015    Procedure: Lower Extremity Angiogram;  Surgeon: Nada LibmanVance W Brabham, MD;  [facility];  Service: Cardiovascular;  Laterality: Left;  . Cardiac catheterization      EF is 55-60% and no wall motion abnormalities (long long time ago)  . Fracture surgery      left arm "many yrs ago"  . Femoral-popliteal bypass graft Left 10/24/2015    Procedure: BYPASS GRAFT FEMORAL below knee POPLITEAL ARTERY with Left Saphenous Vein;  Surgeon: Nada LibmanVance W Brabham, MD;  [facility];  Service: Vascular;  Laterality: Left;  . Amputation Left 10/25/2015    Procedure: Left Foot 5th Ray Amputation;  Surgeon: Nadara MustardMarcus Duda V, MD;  [facility];  Service: Orthopedics;  Laterality: Left;  . Amputation Left 12/06/2015    Procedure: AMPUTATION BELOW KNEE;  Surgeon: Nadara MustardMarcus Duda V, MD;  Location: [facility] OR;  Service: Orthopedics;  Laterality: Left;    There were no vitals filed for this visit.      Subjective Assessment - 02/26/16 1400    Subjective He had low blood sugar and fell out of bed ~6am on Monday 02/23/2016. He went to ED on 7/4 with right rib & left limb pain with possible rib fx and contusion to residual limb.   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 Yes   Pain Score 5    Pain Location Chest   Pain Orientation Right   Pain Descriptors / Indicators Sharp;Aching   Pain Type Acute pain   Pain Onset In the past 7 days   Pain Frequency Intermittent   Aggravating Factors  fall on 7/3, movements & deep breathes   Pain Relieving Factors sitting or supine with back support   Multiple Pain Sites Yes   Pain Score 7   Pain Location Leg  residual limb   Pain Orientation Left;Distal   Pain Descriptors / Indicators Sharp;Sore;Aching    Pain Type Acute pain   Pain Onset In the past 7 days   Pain Frequency Intermittent   Aggravating Factors  fall on 7/3, pressure including wearing liner   Pain Relieving Factors no pressure on limb   Effect of Pain on Daily Activities unable to wear prosthesis      Self-Care:  Patient's residual limb has severe edema pitting with tight shiny skin and redness over distal limb. Only 2 wounds (was 4) present with granulation present over both wounds with no drainage. Severe tenderness to touch & any pressure like donning prosthetic liner. Patient tolerated wear of liner for 15 minutes until pain too high to tolerate wear.  PT attempted to donne prosthesis but patella tendon bar was >3" below proper location. PT performed light (tolerance) retrograde massage for 5 minutes with elevation (PT instructed in ideal position for elevation with recommendation to elevate with LE higher than heart as often as possible).  See pt care instructions.                            PT Education - 02/26/16 1400    Education provided Yes   Education Details wearing liner or shrinker at all times, attempt to wear liner up to 2 hrs or to tolerance with pain every 4 hrs during awake hours, use ace wrap over liner or shrinker as able to increase compression; continue use of Tegaderm when wearing liner;    Person(s) Educated Patient   Methods Explanation;Demonstration;Tactile cues   Comprehension Verbalized understanding;Returned demonstration;Verbal cues required;Tactile cues required          PT Short Term Goals - 02/26/16 1445    PT SHORT TERM GOAL #1   Title Pt verbalizes proper wear schedule for wound management & proper cleaning and demonstrates proper donning of prosthesis. (Target Date: 03/19/16)   Time 5   Period Weeks   Status On-going   PT SHORT TERM GOAL #2   Title Pt will able to ambualte 300' including grass and pavement with LRAD & prosthesis with supervision (Target Date:  03/19/16)   Time 5   Period Weeks   Status On-going   PT SHORT TERM GOAL #3   Title Pt will be able to negoitate ramps, curbs and stairs with supervision with LRAD & prosthesis (Target Date: 03/19/16)   Time 5   Period Weeks   Status On-going   PT SHORT TERM GOAL #4   Title Pt will be able to reach 10" laterally, across midline, anteriorly and to the floor with no UE support utilizing prosthesis with supervision (Target Date: 03/19/16)   Status On-going   PT SHORT TERM GOAL #5   Title Patient reports pain increases <6 increments with standing & gait activities. (Target Date: 03/19/16)  Time 5   Period Weeks   Status On-going           PT Long Term Goals - 02/26/16 1445    PT LONG TERM GOAL #1   Title Pt will wear prosthesis >90 % of all awake hours with no new wounds to indicate increased fucntional use with prosthesis (Target Date: 04/24/16)   Time 10   Period Weeks   Status On-going   PT LONG TERM GOAL #2   Title Pt will be indepedent in prosthetic and residual limb care to enable increased use of prosthesis to improve activity level  (Target Date: 04/24/16)   Time 10   Period Weeks   Status On-going   PT LONG TERM GOAL #3   Title Pt will be able to ambulate >1000' modified independent with prosthesis only including grass, pavement and level surfaces to indicate 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 (Target Date: 04/24/16)   Time 10   Period Weeks   Status On-going   PT LONG TERM GOAL #4   Title Pt will be able to negoitate ramps, curbs and stairs modfied indepedent with prosthesis only to enable increased community access (Target Date: 04/24/16)   Time 10   Period Weeks   Status On-going   PT LONG TERM GOAL #5   Title Pt will be improve Fuctional Gait Assessment will improve from 12/30 to >/= 24/30 to indicate decreased risk for falls (Target Date: 04/24/16)   Time 10   Period Weeks   Status On-going   PT LONG TERM GOAL #6   Title Pt will  increase Berg Balance Score from 40/56 to >/= 51/56 to indicate a decreased risk for falls (Target Date: 04/24/16)   Time 10   Period Weeks   Status On-going   PT LONG TERM GOAL #7   Title Patient reports pain increases < 3 increments with standing & gait activities. (Target Date: 04/24/16)   Time 10   Period Weeks   Status On-going               Plan - 02/26/16 1445    Clinical Impression Statement patient's residual limb is too edematous to donne his prosthesis. Patient's wounds appear more healed than last time PT assessed wounds. PT is concerned that wound could have sealed off cavity with speed of healing with drainage present last session.    Rehab Potential Good   Clinical Impairments Affecting Rehab Potential 4 wounds on residual limb with Type 1 Diabetes > 50 years   PT Frequency 2x / week   PT Duration Other (comment)  10 weeks   PT Treatment/Interventions ADLs/Self Care Home Management;Therapeutic exercise;Manual techniques;Therapeutic activities;Functional mobility training;Stair training;Gait training;DME Instruction;Balance training;Neuromuscular re-education;Patient/family education;Prosthetic Training;Scar mobilization   PT Next Visit Plan assess wounds & edema to resume prostheis wear & use.    Consulted and Agree with Plan of Care Patient;Family member/caregiver   Family Member Consulted girlfriend      Patient will benefit from skilled therapeutic intervention in order to improve the following deficits and impairments:  Abnormal gait, Decreased activity tolerance, Decreased balance, Decreased mobility, Postural dysfunction, Decreased knowledge of use of DME, Decreased knowledge of precautions, Decreased endurance, Decreased safety awareness, Prosthetic Dependency, Pain, Improper body mechanics  Visit Diagnosis: Other abnormalities of gait and mobility  Phantom limb syndrome with pain (HCC)  Repeated falls     Problem List Patient Active Problem List    Diagnosis Date Noted  . Below knee amputation status (HCC)  12/06/2015  . PAD (peripheral artery disease) (HCC) 10/24/2015  . Diabetes mellitus with peripheral artery disease (HCC) 07/30/2014  . Claudication in peripheral vascular disease (HCC) 07/30/2014  . IDDM (insulin dependent diabetes mellitus) (HCC) 01/23/2013  . CAD (coronary artery disease) 04/15/2011  . Hyperlipidemia 04/15/2011    [name] PT, DPT 02/27/2016, 11:13 AM  [facility] [address] [place], [place], [address] Phone: [phone]   Fax:  [phone]  Name: [name] MRN: [id number] Date of Birth: 30-Nov-1950

## 2016-03-02 ENCOUNTER — Ambulatory Visit (HOSPITAL_COMMUNITY): Payer: BLUE CROSS/BLUE SHIELD | Admitting: Certified Registered"

## 2016-03-02 ENCOUNTER — Other Ambulatory Visit (HOSPITAL_COMMUNITY): Payer: Self-pay | Admitting: Family

## 2016-03-02 ENCOUNTER — Encounter (HOSPITAL_COMMUNITY)
Admission: AD | Disposition: A | Discharge status: Home Health | Payer: Self-pay | Source: Ambulatory Visit | Attending: Orthopedic Surgery

## 2016-03-02 ENCOUNTER — Encounter (HOSPITAL_COMMUNITY): Payer: Self-pay | Admitting: *Deleted

## 2016-03-02 ENCOUNTER — Inpatient Hospital Stay (HOSPITAL_COMMUNITY)
Admission: AD | Admit: 2016-03-02 | Discharge: 2016-03-04 | DRG: 475 | Disposition: A | Discharge status: Home Health | Payer: BLUE CROSS/BLUE SHIELD | Source: Ambulatory Visit | Attending: Orthopedic Surgery | Admitting: Orthopedic Surgery

## 2016-03-02 DIAGNOSIS — E785 Hyperlipidemia, unspecified: Secondary | ICD-10-CM | POA: Diagnosis present

## 2016-03-02 DIAGNOSIS — Z794 Long term (current) use of insulin: Secondary | ICD-10-CM

## 2016-03-02 DIAGNOSIS — W1830XA Fall on same level, unspecified, initial encounter: Secondary | ICD-10-CM | POA: Diagnosis present

## 2016-03-02 DIAGNOSIS — E1151 Type 2 diabetes mellitus with diabetic peripheral angiopathy without gangrene: Secondary | ICD-10-CM | POA: Diagnosis present

## 2016-03-02 DIAGNOSIS — IMO0002 Reserved for concepts with insufficient information to code with codable children: Secondary | ICD-10-CM

## 2016-03-02 DIAGNOSIS — I2510 Atherosclerotic heart disease of native coronary artery without angina pectoris: Secondary | ICD-10-CM | POA: Diagnosis present

## 2016-03-02 DIAGNOSIS — E11319 Type 2 diabetes mellitus with unspecified diabetic retinopathy without macular edema: Secondary | ICD-10-CM | POA: Diagnosis present

## 2016-03-02 DIAGNOSIS — F17210 Nicotine dependence, cigarettes, uncomplicated: Secondary | ICD-10-CM | POA: Diagnosis present

## 2016-03-02 DIAGNOSIS — M869 Osteomyelitis, unspecified: Secondary | ICD-10-CM | POA: Diagnosis present

## 2016-03-02 DIAGNOSIS — Z8249 Family history of ischemic heart disease and other diseases of the circulatory system: Secondary | ICD-10-CM

## 2016-03-02 DIAGNOSIS — Z7982 Long term (current) use of aspirin: Secondary | ICD-10-CM

## 2016-03-02 DIAGNOSIS — Z79899 Other long term (current) drug therapy: Secondary | ICD-10-CM

## 2016-03-02 DIAGNOSIS — Z888 Allergy status to other drugs, medicaments and biological substances status: Secondary | ICD-10-CM

## 2016-03-02 DIAGNOSIS — E1169 Type 2 diabetes mellitus with other specified complication: Secondary | ICD-10-CM | POA: Diagnosis present

## 2016-03-02 DIAGNOSIS — S88112A Complete traumatic amputation at level between knee and ankle, left lower leg, initial encounter: Secondary | ICD-10-CM

## 2016-03-02 DIAGNOSIS — Z89512 Acquired absence of left leg below knee: Secondary | ICD-10-CM

## 2016-03-02 DIAGNOSIS — T8781 Dehiscence of amputation stump: Principal | ICD-10-CM | POA: Diagnosis present

## 2016-03-02 HISTORY — PX: STUMP REVISION: SHX6102

## 2016-03-02 LAB — CBC WITH DIFFERENTIAL/PLATELET
BASOS ABS: 0 10*3/uL (ref 0.0–0.1)
BASOS PCT: 0 %
Eosinophils Absolute: 0 10*3/uL (ref 0.0–0.7)
Eosinophils Relative: 0 %
HCT: 37.5 % — ABNORMAL LOW (ref 39.0–52.0)
Hemoglobin: 13 g/dL (ref 13.0–17.0)
LYMPHS ABS: 1.6 10*3/uL (ref 0.7–4.0)
Lymphocytes Relative: 12 %
MCH: 32 pg (ref 26.0–34.0)
MCHC: 34.7 g/dL (ref 30.0–36.0)
MCV: 92.4 fL (ref 78.0–100.0)
MONOS PCT: 12 %
Monocytes Absolute: 1.6 10*3/uL — ABNORMAL HIGH (ref 0.1–1.0)
NEUTROS ABS: 10.4 10*3/uL — AB (ref 1.7–7.7)
Neutrophils Relative %: 76 %
PLATELETS: 498 10*3/uL — AB (ref 150–400)
RBC: 4.06 MIL/uL — ABNORMAL LOW (ref 4.22–5.81)
RDW: 14.3 % (ref 11.5–15.5)
WBC: 13.6 10*3/uL — ABNORMAL HIGH (ref 4.0–10.5)

## 2016-03-02 LAB — BASIC METABOLIC PANEL
Anion gap: 14 (ref 5–15)
BUN: 28 mg/dL — AB (ref 6–20)
CO2: 22 mmol/L (ref 22–32)
Calcium: 9.3 mg/dL (ref 8.9–10.3)
Chloride: 97 mmol/L — ABNORMAL LOW (ref 101–111)
Creatinine, Ser: 1.15 mg/dL (ref 0.61–1.24)
GFR calc Af Amer: >60 mL/min (ref >60)
GLUCOSE: 345 mg/dL — AB (ref 65–99)
POTASSIUM: 5.7 mmol/L — AB (ref 3.5–5.1)
Sodium: 133 mmol/L — ABNORMAL LOW (ref 135–145)

## 2016-03-02 LAB — GLUCOSE, CAPILLARY
GLUCOSE-CAPILLARY: 308 mg/dL — AB (ref 65–99)
GLUCOSE-CAPILLARY: 321 mg/dL — AB (ref 65–99)
Glucose-Capillary: 357 mg/dL — ABNORMAL HIGH (ref 65–99)
Glucose-Capillary: 288 mg/dL — ABNORMAL HIGH (ref 65–99)
Glucose-Capillary: 334 mg/dL — ABNORMAL HIGH (ref 65–99)

## 2016-03-02 LAB — SURGICAL PCR SCREEN
MRSA, PCR: NEGATIVE
STAPHYLOCOCCUS AUREUS: NEGATIVE

## 2016-03-02 SURGERY — REVISION, AMPUTATION SITE
Anesthesia: General | Site: Leg Lower | Laterality: Left

## 2016-03-02 MED ORDER — KETOROLAC TROMETHAMINE 15 MG/ML IJ SOLN
INTRAMUSCULAR | Status: AC
  Administered 2016-03-02: 7.5 mg via INTRAVENOUS
  Filled 2016-03-02: qty 1

## 2016-03-02 MED ORDER — ACETAMINOPHEN 160 MG/5ML PO SOLN
325.0000 mg | ORAL | Status: DC | PRN
  Filled 2016-03-02: qty 20.3

## 2016-03-02 MED ORDER — RAMIPRIL 10 MG PO CAPS
10.0000 mg | ORAL_CAPSULE | Freq: Every day | ORAL | Status: DC
  Administered 2016-03-03: 10 mg via ORAL
  Filled 2016-03-02 (×2): qty 1

## 2016-03-02 MED ORDER — ACETAMINOPHEN 650 MG RE SUPP: 650.0000 mg | Freq: Four times a day (QID) | RECTAL | Status: DC | PRN

## 2016-03-02 MED ORDER — HYDROMORPHONE HCL 1 MG/ML IJ SOLN
INTRAMUSCULAR | Status: AC
  Administered 2016-03-02: 0.5 mg via INTRAVENOUS
  Filled 2016-03-02: qty 1

## 2016-03-02 MED ORDER — ONDANSETRON HCL 4 MG/2ML IJ SOLN
INTRAMUSCULAR | Status: AC
  Filled 2016-03-02: qty 4

## 2016-03-02 MED ORDER — INSULIN PUMP
Freq: Three times a day (TID) | SUBCUTANEOUS | Status: DC
  Administered 2016-03-02: 2.5 via SUBCUTANEOUS
  Administered 2016-03-03: 5.25 via SUBCUTANEOUS
  Administered 2016-03-03: 5.5 via SUBCUTANEOUS
  Filled 2016-03-02: qty 1

## 2016-03-02 MED ORDER — KETOROLAC TROMETHAMINE 15 MG/ML IJ SOLN
7.5000 mg | Freq: Four times a day (QID) | INTRAMUSCULAR | Status: AC
  Administered 2016-03-02 – 2016-03-03 (×4): 7.5 mg via INTRAVENOUS
  Filled 2016-03-02 (×4): qty 1

## 2016-03-02 MED ORDER — ASPIRIN EC 81 MG PO TBEC
243.0000 mg | DELAYED_RELEASE_TABLET | Freq: Every day | ORAL | Status: DC
  Administered 2016-03-03: 243 mg via ORAL
  Filled 2016-03-02 (×3): qty 3

## 2016-03-02 MED ORDER — INSULIN ASPART 100 UNIT/ML ~~LOC~~ SOLN: 0.0000 [IU] | Freq: Three times a day (TID) | SUBCUTANEOUS | Status: DC

## 2016-03-02 MED ORDER — FENTANYL CITRATE (PF) 250 MCG/5ML IJ SOLN
INTRAMUSCULAR | Status: AC
  Filled 2016-03-02: qty 5

## 2016-03-02 MED ORDER — ONDANSETRON HCL 4 MG/2ML IJ SOLN
INTRAMUSCULAR | Status: DC | PRN
  Administered 2016-03-02: 4 mg via INTRAVENOUS

## 2016-03-02 MED ORDER — LACTATED RINGERS IV SOLN
INTRAVENOUS | Status: DC
  Administered 2016-03-02: 17:00:00 via INTRAVENOUS

## 2016-03-02 MED ORDER — EPHEDRINE SULFATE 50 MG/ML IJ SOLN
INTRAMUSCULAR | Status: DC | PRN
  Administered 2016-03-02: 10 mg via INTRAVENOUS
  Administered 2016-03-02: 15 mg via INTRAVENOUS

## 2016-03-02 MED ORDER — METHOCARBAMOL 500 MG PO TABS
500.0000 mg | ORAL_TABLET | Freq: Four times a day (QID) | ORAL | Status: DC | PRN
  Administered 2016-03-02 – 2016-03-03 (×2): 500 mg via ORAL
  Filled 2016-03-02: qty 1

## 2016-03-02 MED ORDER — METOCLOPRAMIDE HCL 5 MG/ML IJ SOLN: 5.0000 mg | Freq: Three times a day (TID) | INTRAMUSCULAR | Status: DC | PRN

## 2016-03-02 MED ORDER — BACLOFEN 20 MG PO TABS
20.0000 mg | ORAL_TABLET | Freq: Every day | ORAL | Status: DC | PRN
  Administered 2016-03-03: 20 mg via ORAL
  Filled 2016-03-02: qty 1

## 2016-03-02 MED ORDER — PROPOFOL 10 MG/ML IV BOLUS
INTRAVENOUS | Status: AC
  Filled 2016-03-02: qty 20

## 2016-03-02 MED ORDER — LIDOCAINE 2% (20 MG/ML) 5 ML SYRINGE
INTRAMUSCULAR | Status: DC | PRN
  Administered 2016-03-02: 100 mg via INTRAVENOUS

## 2016-03-02 MED ORDER — METOPROLOL SUCCINATE ER 25 MG PO TB24
25.0000 mg | ORAL_TABLET | Freq: Every day | ORAL | Status: DC
  Administered 2016-03-03: 25 mg via ORAL
  Filled 2016-03-02 (×2): qty 1

## 2016-03-02 MED ORDER — EPHEDRINE 5 MG/ML INJ
INTRAVENOUS | Status: AC
  Filled 2016-03-02: qty 10

## 2016-03-02 MED ORDER — MIDAZOLAM HCL 2 MG/2ML IJ SOLN
INTRAMUSCULAR | Status: AC
  Filled 2016-03-02: qty 2

## 2016-03-02 MED ORDER — ONDANSETRON HCL 4 MG PO TABS: 4.0000 mg | ORAL_TABLET | Freq: Four times a day (QID) | ORAL | Status: DC | PRN

## 2016-03-02 MED ORDER — CILOSTAZOL 100 MG PO TABS
100.0000 mg | ORAL_TABLET | Freq: Two times a day (BID) | ORAL | Status: DC
  Administered 2016-03-02 – 2016-03-03 (×3): 100 mg via ORAL
  Filled 2016-03-02 (×4): qty 1

## 2016-03-02 MED ORDER — MORPHINE SULFATE 15 MG PO TABS
15.0000 mg | ORAL_TABLET | ORAL | Status: DC | PRN
  Administered 2016-03-02 – 2016-03-03 (×2): 15 mg via ORAL
  Filled 2016-03-02 (×2): qty 1

## 2016-03-02 MED ORDER — SUCCINYLCHOLINE CHLORIDE 20 MG/ML IJ SOLN
INTRAMUSCULAR | Status: DC | PRN
  Administered 2016-03-02: 70 mg via INTRAVENOUS

## 2016-03-02 MED ORDER — ACETAMINOPHEN 325 MG PO TABS: 325.0000 mg | ORAL_TABLET | ORAL | Status: DC | PRN

## 2016-03-02 MED ORDER — LIDOCAINE 2% (20 MG/ML) 5 ML SYRINGE
INTRAMUSCULAR | Status: AC
  Filled 2016-03-02: qty 5

## 2016-03-02 MED ORDER — OXYCODONE HCL 5 MG PO TABS
5.0000 mg | ORAL_TABLET | Freq: Once | ORAL | Status: AC | PRN
  Administered 2016-03-02: 5 mg via ORAL

## 2016-03-02 MED ORDER — CHLORHEXIDINE GLUCONATE 4 % EX LIQD: 60.0000 mL | Freq: Once | CUTANEOUS | Status: DC

## 2016-03-02 MED ORDER — CEFAZOLIN IN D5W 1 GM/50ML IV SOLN
1.0000 g | Freq: Four times a day (QID) | INTRAVENOUS | Status: AC
  Administered 2016-03-02 – 2016-03-03 (×3): 1 g via INTRAVENOUS
  Filled 2016-03-02 (×3): qty 50

## 2016-03-02 MED ORDER — INSULIN ASPART 100 UNIT/ML ~~LOC~~ SOLN: 4.0000 [IU] | Freq: Three times a day (TID) | SUBCUTANEOUS | Status: DC

## 2016-03-02 MED ORDER — METOCLOPRAMIDE HCL 5 MG PO TABS: 5.0000 mg | ORAL_TABLET | Freq: Three times a day (TID) | ORAL | Status: DC | PRN

## 2016-03-02 MED ORDER — PRAVASTATIN SODIUM 40 MG PO TABS
40.0000 mg | ORAL_TABLET | Freq: Every day | ORAL | Status: DC
  Administered 2016-03-02 – 2016-03-03 (×2): 40 mg via ORAL
  Filled 2016-03-02 (×2): qty 1

## 2016-03-02 MED ORDER — 0.9 % SODIUM CHLORIDE (POUR BTL) OPTIME
TOPICAL | Status: DC | PRN
Start: 2016-03-02 — End: 2016-03-02
  Administered 2016-03-02: 1000 mL

## 2016-03-02 MED ORDER — CEFAZOLIN SODIUM-DEXTROSE 2-4 GM/100ML-% IV SOLN
2.0000 g | INTRAVENOUS | Status: AC
  Administered 2016-03-02: 2 g via INTRAVENOUS
  Filled 2016-03-02: qty 100

## 2016-03-02 MED ORDER — OXYCODONE-ACETAMINOPHEN 5-325 MG PO TABS: 1.0000 | ORAL_TABLET | ORAL | Status: DC | PRN

## 2016-03-02 MED ORDER — DEXTROSE 5 % IV SOLN
500.0000 mg | Freq: Four times a day (QID) | INTRAVENOUS | Status: DC | PRN
  Filled 2016-03-02: qty 5

## 2016-03-02 MED ORDER — SODIUM CHLORIDE 0.9 % IV SOLN: INTRAVENOUS | Status: DC

## 2016-03-02 MED ORDER — ONDANSETRON HCL 4 MG/2ML IJ SOLN: 4.0000 mg | Freq: Four times a day (QID) | INTRAMUSCULAR | Status: DC | PRN

## 2016-03-02 MED ORDER — OXYCODONE HCL 5 MG PO TABS
ORAL_TABLET | ORAL | Status: AC
  Filled 2016-03-02: qty 2

## 2016-03-02 MED ORDER — HYDROMORPHONE HCL 1 MG/ML IJ SOLN
0.2500 mg | INTRAMUSCULAR | Status: DC | PRN
  Administered 2016-03-02 (×3): 0.5 mg via INTRAVENOUS

## 2016-03-02 MED ORDER — ACETAMINOPHEN 325 MG PO TABS: 650.0000 mg | ORAL_TABLET | Freq: Four times a day (QID) | ORAL | Status: DC | PRN

## 2016-03-02 MED ORDER — PROPOFOL 10 MG/ML IV BOLUS
INTRAVENOUS | Status: DC | PRN
  Administered 2016-03-02: 50 mg via INTRAVENOUS
  Administered 2016-03-02: 200 mg via INTRAVENOUS

## 2016-03-02 MED ORDER — OXYCODONE HCL 5 MG/5ML PO SOLN: 5.0000 mg | Freq: Once | ORAL | Status: AC | PRN

## 2016-03-02 MED ORDER — METHOCARBAMOL 500 MG PO TABS
ORAL_TABLET | ORAL | Status: AC
  Administered 2016-03-02: 500 mg via ORAL
  Filled 2016-03-02: qty 1

## 2016-03-02 MED ORDER — HYDROMORPHONE HCL 1 MG/ML IJ SOLN: 1.0000 mg | INTRAMUSCULAR | Status: DC | PRN

## 2016-03-02 MED ORDER — MIDAZOLAM HCL 5 MG/5ML IJ SOLN
INTRAMUSCULAR | Status: DC | PRN
  Administered 2016-03-02: 2 mg via INTRAVENOUS

## 2016-03-02 MED ORDER — POVIDONE-IODINE 10 % EX SWAB: 2.0000 "application " | Freq: Once | CUTANEOUS | Status: DC

## 2016-03-02 MED ORDER — FENTANYL CITRATE (PF) 250 MCG/5ML IJ SOLN
INTRAMUSCULAR | Status: DC | PRN
  Administered 2016-03-02 (×2): 100 ug via INTRAVENOUS
  Administered 2016-03-02: 50 ug via INTRAVENOUS
  Administered 2016-03-02 (×2): 100 ug via INTRAVENOUS
  Administered 2016-03-02: 50 ug via INTRAVENOUS

## 2016-03-02 SURGICAL SUPPLY — 40 items
BLADE SAW RECIP 87.9 MT (BLADE) ×2 IMPLANT
BLADE SURG 21 STRL SS (BLADE) ×3 IMPLANT
BNDG COHESIVE 6X5 TAN STRL LF (GAUZE/BANDAGES/DRESSINGS) ×3 IMPLANT
BNDG GAUZE ELAST 4 BULKY (GAUZE/BANDAGES/DRESSINGS) IMPLANT
CANISTER WOUND CARE 500ML ATS (WOUND CARE) ×2 IMPLANT
COVER SURGICAL LIGHT HANDLE (MISCELLANEOUS) ×3 IMPLANT
DRAPE EXTREMITY T 121X128X90 (DRAPE) ×3 IMPLANT
DRAPE INCISE IOBAN 66X45 STRL (DRAPES) IMPLANT
DRAPE PROXIMA HALF (DRAPES) ×3 IMPLANT
DRAPE U-SHAPE 47X51 STRL (DRAPES) ×6 IMPLANT
DRSG ADAPTIC 3X8 NADH LF (GAUZE/BANDAGES/DRESSINGS) IMPLANT
DURAPREP 26ML APPLICATOR (WOUND CARE) ×3 IMPLANT
ELECT REM PT RETURN 9FT ADLT (ELECTROSURGICAL) ×3
ELECTRODE REM PT RTRN 9FT ADLT (ELECTROSURGICAL) ×1 IMPLANT
GAUZE SPONGE 4X4 12PLY STRL (GAUZE/BANDAGES/DRESSINGS) ×3 IMPLANT
GLOVE BIOGEL PI IND STRL 9 (GLOVE) ×1 IMPLANT
GLOVE BIOGEL PI INDICATOR 9 (GLOVE) ×2
GLOVE SURG ORTHO 9.0 STRL STRW (GLOVE) ×5 IMPLANT
GOWN STRL REUS W/ TWL XL LVL3 (GOWN DISPOSABLE) ×2 IMPLANT
GOWN STRL REUS W/TWL XL LVL3 (GOWN DISPOSABLE) ×6
KIT BASIN OR (CUSTOM PROCEDURE TRAY) ×3 IMPLANT
KIT ROOM TURNOVER OR (KITS) ×3 IMPLANT
MANIFOLD NEPTUNE II (INSTRUMENTS) ×3 IMPLANT
NS IRRIG 1000ML POUR BTL (IV SOLUTION) ×3 IMPLANT
PACK GENERAL/GYN (CUSTOM PROCEDURE TRAY) ×3 IMPLANT
PAD ARMBOARD 7.5X6 YLW CONV (MISCELLANEOUS) ×3 IMPLANT
PAD NEG PRESSURE SENSATRAC (MISCELLANEOUS) IMPLANT
PREVENA INCISION MGT 90 150 (MISCELLANEOUS) ×2 IMPLANT
SPONGE LAP 18X18 X RAY DECT (DISPOSABLE) ×2 IMPLANT
SPONGE LAP 4X18 X RAY DECT (DISPOSABLE) ×2 IMPLANT
STAPLER VISISTAT 35W (STAPLE) IMPLANT
STOCKINETTE IMPERVIOUS LG (DRAPES) IMPLANT
SUT ETHILON 2 0 PSLX (SUTURE) ×8 IMPLANT
SUT PDS AB 1 CT  36 (SUTURE)
SUT PDS AB 1 CT 36 (SUTURE) IMPLANT
SUT SILK 2 0 (SUTURE) ×3
SUT SILK 2-0 18XBRD TIE 12 (SUTURE) IMPLANT
SUT VIC AB 1 CTX 36 (SUTURE) ×6
SUT VIC AB 1 CTX36XBRD ANBCTR (SUTURE) IMPLANT
TOWEL OR 17X26 10 PK STRL BLUE (TOWEL DISPOSABLE) ×5 IMPLANT

## 2016-03-02 NOTE — Anesthesia Procedure Notes (Signed)
Procedure Name: Intubation Date/Time: 03/02/2016 5:36 PM Performed by: Charm BargesBUTLER, Glynis Hunsucker R Pre-anesthesia Checklist: Patient identified, Emergency Drugs available, Suction available and Patient being monitored Patient Re-evaluated:Patient Re-evaluated prior to inductionOxygen Delivery Method: Circle System Utilized Preoxygenation: Pre-oxygenation with 100% oxygen Intubation Type: IV induction Ventilation: Mask ventilation without difficulty Laryngoscope Size: Mac and 4 Grade View: Grade I Tube type: Oral Tube size: 8.0 mm Number of attempts: 1 Airway Equipment and Method: Stylet Placement Confirmation: ETT inserted through vocal cords under direct vision,  positive ETCO2 and breath sounds checked- equal and bilateral Secured at: 22 cm Tube secured with: Tape Dental Injury: Teeth and Oropharynx as per pre-operative assessment

## 2016-03-02 NOTE — Progress Notes (Signed)
Diabetes coor calledEilene Ghazi( Jeannine) and informed pt here with insulin pump and cbg 357 today.Pt did bolus himself 4.75 units. Dr Maple HudsonMoser called and informed. Informed pt to set insulin on basel rate per Dr Maple HudsonMoser.

## 2016-03-02 NOTE — Op Note (Signed)
03/02/2016  6:26 PM  PATIENT:  [name]    PRE-OPERATIVE DIAGNOSIS:  Osteomyelitis, Abscess Left Below Knee Amputation  POST-OPERATIVE DIAGNOSIS:  Same  PROCEDURE:  Revision Left Below Knee Amputation Application Prevena wound VAC  SURGEON:  Nadara MustardUDA,MARCUS V, MD  PHYSICIAN ASSISTANT:None ANESTHESIA:   General  PREOPERATIVE INDICATIONS:  [name] BostonStephen L Martel is a  65 y.o. male with a diagnosis of Osteomyelitis, Abscess Left Below Knee Amputation who failed conservative measures and elected for surgical management.    The risks benefits and alternatives were discussed with the patient preoperatively including but not limited to the risks of infection, bleeding, nerve injury, cardiopulmonary complications, the need for revision surgery, among others, and the patient was willing to proceed.  OPERATIVE IMPLANTS: Prevena wound VAC  OPERATIVE FINDINGS: Abscess bone and tissue exposed removed in 1 block of tissue  OPERATIVE PROCEDURE: Patient brought the operating room and underwent a general anesthetic. After adequate levels anesthesia obtained patient's left lower extremity was prepped using DuraPrep draped into a sterile field a timeout was called. A fishmouth incision was made around the ulcerative tissue this was carried sharply down to bone. The distal 2 cm of tibia and fibula were resected in 1 block of tissue. Electrocautery and 2-0 silk was used for hemostasis. The wound was irrigated with normal saline. The deep and superficial fascial layers and skin was closed using 2-0 nylon. A Prevena wound VAC was applied this had a good suction fit patient was extubated taken to the PACU in stable condition.

## 2016-03-02 NOTE — Anesthesia Preprocedure Evaluation (Addendum)
Anesthesia Evaluation  Patient identified by MRN, date of birth, ID band Patient awake    Reviewed: Allergy & Precautions, NPO status , Patient's Chart, lab work & pertinent test results  History of Anesthesia Complications Negative for: history of anesthetic complications  Airway Mallampati: II  TM Distance: >3 FB Neck ROM: Full    Dental  (+) Teeth Intact, Dental Advisory Given   Pulmonary Current Smoker,    breath sounds clear to auscultation       Cardiovascular + CAD and + Peripheral Vascular Disease   Rhythm:Regular     Neuro/Psych negative neurological ROS  negative psych ROS   GI/Hepatic negative GI ROS, Neg liver ROS,   Endo/Other  diabetes, Poorly Controlled, Type 2, Insulin Dependent  Renal/GU negative Renal ROS     Musculoskeletal   Abdominal   Peds  Hematology  (+) anemia ,   Anesthesia Other Findings   Reproductive/Obstetrics                            Anesthesia Physical Anesthesia Plan  ASA: III  Anesthesia Plan: General   Post-op Pain Management:    Induction: Intravenous  Airway Management Planned: Oral ETT  Additional Equipment: None  Intra-op Plan:   Post-operative Plan: Extubation in OR  Informed Consent: I have reviewed the patients History and Physical, chart, labs and discussed the procedure including the risks, benefits and alternatives for the proposed anesthesia with the patient or authorized representative who has indicated his/her understanding and acceptance.   Dental advisory given  Plan Discussed with: CRNA, Anesthesiologist and Surgeon  Anesthesia Plan Comments:        Anesthesia Quick Evaluation

## 2016-03-02 NOTE — H&P (Signed)
[name] is an 65 y.o. male.   Chief Complaint: Dehiscence abscess left transtibial amputation HPI: Patient is a 65 year old gentleman diabetic insensate neuropathy tobacco abuse peripheral vascular disease who is status post limb salvage intervention with total revision of a below-knee amputation on the left. Patient fell on his leg had progressive dehiscence and now presents with purulent drainage and cellulitis of the residual limb despite being on oral antibiotics.  Past Medical History  Diagnosis Date  . Coronary artery disease   . Peripheral vascular disease (HCC)   . Dyslipidemia   . Cigarette smoker   . Diabetes mellitus     type 1  x 50 yrs  . Diabetic retinopathy [facility](HCC)     Past Surgical History  Procedure Laterality Date  . Lower extremity angiogram N/A 01/23/2014    Procedure: LOWER EXTREMITY ANGIOGRAM;  Surgeon: Pamella PertJagadeesh R Ganji, MD;  [facility];  Service: Cardiovascular;  Laterality: N/A;  . Abdominal angiogram N/A 05/29/2014    Procedure: ABDOMINAL ANGIOGRAM;  Surgeon: Pamella PertJagadeesh R Ganji, MD;  [facility];  Service: Cardiovascular;  Laterality: N/A;  . Lower extremity angiogram N/A 07/31/2014    Procedure: LOWER EXTREMITY ANGIOGRAM;  Surgeon: Pamella PertJagadeesh R Ganji, MD;  [facility];  Service: Cardiovascular;  Laterality: N/A;  . Peripheral vascular catheterization Left 10/11/2015    Procedure: Peripheral Vascular Balloon Angioplasty;  Surgeon: Nada LibmanVance W Brabham, MD;  Location: [facility] INVASIVE CV LAB;  Service: Cardiovascular;  Laterality: Left;  sfa failed unable to cross occluded sfa  . Peripheral vascular catheterization N/A 10/11/2015    Procedure: Abdominal Aortogram;  Surgeon: Nada LibmanVance W Brabham, MD;  Location: [facility] INVASIVE CV LAB;  Service: Cardiovascular;  Laterality: N/A;  . Lower extremity angiogram Left 10/11/2015    Procedure: Lower Extremity Angiogram;  Surgeon: Nada LibmanVance W Brabham, MD;  [facility];  Service: Cardiovascular;   Laterality: Left;  . Cardiac catheterization      EF is 55-60% and no wall motion abnormalities (long long time ago)  . Fracture surgery      left arm "many yrs ago"  . Femoral-popliteal bypass graft Left 10/24/2015    Procedure: BYPASS GRAFT FEMORAL below knee POPLITEAL ARTERY with Left Saphenous Vein;  Surgeon: Nada LibmanVance W Brabham, MD;  [facility];  Service: Vascular;  Laterality: Left;  . Amputation Left 10/25/2015    Procedure: Left Foot 5th Ray Amputation;  Surgeon: Nadara MustardMarcus Shantice Menger V, MD;  [facility];  Service: Orthopedics;  Laterality: Left;  . Amputation Left 12/06/2015    Procedure: AMPUTATION BELOW KNEE;  Surgeon: Nadara MustardMarcus Masiyah Jorstad V, MD;  Location: [facility] OR;  Service: Orthopedics;  Laterality: Left;    Family History  Problem Relation Age of Onset  . Coronary artery disease Mother    Social History:  reports that he has been smoking Cigarettes.  He has a 15 pack-year smoking history. He has never used smokeless tobacco. He reports that he drinks alcohol. He reports that he does not use illicit drugs.  Allergies:  Allergies  Allergen Reactions  . Simvastatin Other (See Comments)    MYALGIAS, MUSCLE WEAKNESS     Medications Prior to Admission  Medication Sig Dispense Refill  . acetaminophen (TYLENOL) 500 MG tablet Take 1,000 mg by mouth 2 (two) times daily as needed (pain).     Marland Kitchen. aspirin EC 81 MG tablet Take 243 mg by mouth daily.     . baclofen (LIORESAL) 20 MG tablet Take 20 mg by mouth  daily as needed (for leg cramps). Reported on 12/05/2015  0  . cilostazol (PLETAL) 100 MG tablet Take 1 tablet (100 mg total) by mouth 2 (two) times daily. (Patient taking differently: Take 100 mg by mouth daily. ) 60 tablet 6  . doxycycline (VIBRAMYCIN) 100 MG capsule Take 100 mg by mouth 2 (two) times daily.    . folic acid (FOLVITE) 800 MCG tablet Take 1,600 mcg by mouth daily.     . metoprolol succinate (TOPROL-XL) 25 MG 24 hr tablet Take 1 tablet (25 mg total) by mouth daily. 30 tablet 1  .  Multiple Vitamins-Minerals (MULTIVITAMIN GUMMIES ADULT PO) Take 2 tablets by mouth daily.     . pravastatin (PRAVACHOL) 40 MG tablet Take 40 mg by mouth at bedtime.     . ramipril (ALTACE) 10 MG tablet Take 10 mg by mouth daily.      . calcium carbonate (TUMS - DOSED IN MG ELEMENTAL CALCIUM) 500 MG chewable tablet Chew 2 tablets by mouth every 6 (six) hours as needed for indigestion or heartburn.    . morphine (MSIR) 15 MG tablet Take 1 tablet (15 mg total) by mouth every 4 (four) hours as needed for severe pain. 5 tablet 0  . NOVOLOG 100 UNIT/ML injection Inject into the skin continuous. Via insulin pump  0  . oxyCODONE-acetaminophen (PERCOCET/ROXICET) 5-325 MG tablet Take 1 tablet by mouth every 4 (four) hours as needed for severe pain. Reported on 02/13/2016      Results for orders placed or performed during the hospital encounter of 03/02/16 (from the past 48 hour(s))  Glucose, capillary     Status: Abnormal   Collection Time: 03/02/16  3:25 PM  Result Value Ref Range   Glucose-Capillary 357 (H) 65 - 99 mg/dL  CBC WITH DIFFERENTIAL     Status: Abnormal   Collection Time: 03/02/16  3:30 PM  Result Value Ref Range   WBC 13.6 (H) 4.0 - 10.5 K/uL   RBC 4.06 (L) 4.22 - 5.81 MIL/uL   Hemoglobin 13.0 13.0 - 17.0 g/dL   HCT 44.0 (L) 10.2 - 72.5 %   MCV 92.4 78.0 - 100.0 fL   MCH 32.0 26.0 - 34.0 pg   MCHC 34.7 30.0 - 36.0 g/dL   RDW 36.6 44.0 - 34.7 %   Platelets 498 (H) 150 - 400 K/uL   Neutrophils Relative % 76 %   Lymphocytes Relative 12 %   Monocytes Relative 12 %   Eosinophils Relative 0 %   Basophils Relative 0 %   Neutro Abs 10.4 (H) 1.7 - 7.7 K/uL   Lymphs Abs 1.6 0.7 - 4.0 K/uL   Monocytes Absolute 1.6 (H) 0.1 - 1.0 K/uL   Eosinophils Absolute 0.0 0.0 - 0.7 K/uL   Basophils Absolute 0.0 0.0 - 0.1 K/uL  Glucose, capillary     Status: Abnormal   Collection Time: 03/02/16  4:23 PM  Result Value Ref Range   Glucose-Capillary 308 (H) 65 - 99 mg/dL   No results found.  Review  of Systems  All other systems reviewed and are negative.   Blood pressure 161/55, pulse 83, temperature 98.7 F (37.1 C), temperature source Oral, resp. rate 18, height  (1.803 m), weight 68.04 kg (150 lb), SpO2 100 %. Physical Exam  Purulent drainage dehiscence cellulitis left transtibial amputation Assessment/Plan Assessment: Purulence abscess cellulitis dehiscence left transtibial dictation with diabetic insensate neuropathy and persistent tobacco use.  Plan: We will plan for revision of the transtibial amputation patient  will not consent to an above-knee amputation at this time. Discussed with the patient at this does not heal his only option would be a above-the-knee amputation. We'll plan for overnight observation.  Nadara Mustard, MD 03/02/2016, 5:21 PM

## 2016-03-02 NOTE — Transfer of Care (Signed)
Immediate Anesthesia Transfer of Care Note  Patient: [name]  Procedure(s) Performed: Procedure(s): Revision Left Below Knee Amputation (Left)  Patient Location: PACU  Anesthesia Type:General  Level of Consciousness: awake, oriented and patient cooperative  Airway & Oxygen Therapy: Patient Spontanous Breathing and Patient connected to nasal cannula oxygen  Post-op Assessment: Report given to RN, Post -op Vital signs reviewed and stable and Patient moving all extremities  Post vital signs: Reviewed and stable  Last Vitals:  Filed Vitals:   03/02/16 1544 03/02/16 1824  BP: 161/55 124/69  Pulse:  123  Temp:  36.4 C  Resp:  16    Last Pain:  Filed Vitals:   03/02/16 1826  PainSc: 2       Patients Stated Pain Goal: 2 (03/02/16 1525)  Complications: No apparent anesthesia complications

## 2016-03-02 NOTE — Progress Notes (Signed)
Dr Maple HudsonMoser informed of last cbg 308.

## 2016-03-03 ENCOUNTER — Encounter (HOSPITAL_COMMUNITY): Payer: Self-pay | Admitting: Orthopedic Surgery

## 2016-03-03 DIAGNOSIS — Z794 Long term (current) use of insulin: Secondary | ICD-10-CM | POA: Diagnosis not present

## 2016-03-03 DIAGNOSIS — I2510 Atherosclerotic heart disease of native coronary artery without angina pectoris: Secondary | ICD-10-CM | POA: Diagnosis present

## 2016-03-03 DIAGNOSIS — Z888 Allergy status to other drugs, medicaments and biological substances status: Secondary | ICD-10-CM | POA: Diagnosis not present

## 2016-03-03 DIAGNOSIS — Z7982 Long term (current) use of aspirin: Secondary | ICD-10-CM | POA: Diagnosis not present

## 2016-03-03 DIAGNOSIS — T8781 Dehiscence of amputation stump: Secondary | ICD-10-CM | POA: Diagnosis present

## 2016-03-03 DIAGNOSIS — M869 Osteomyelitis, unspecified: Secondary | ICD-10-CM | POA: Diagnosis present

## 2016-03-03 DIAGNOSIS — E11319 Type 2 diabetes mellitus with unspecified diabetic retinopathy without macular edema: Secondary | ICD-10-CM | POA: Diagnosis present

## 2016-03-03 DIAGNOSIS — Z8249 Family history of ischemic heart disease and other diseases of the circulatory system: Secondary | ICD-10-CM | POA: Diagnosis not present

## 2016-03-03 DIAGNOSIS — Z79899 Other long term (current) drug therapy: Secondary | ICD-10-CM | POA: Diagnosis not present

## 2016-03-03 DIAGNOSIS — E1169 Type 2 diabetes mellitus with other specified complication: Secondary | ICD-10-CM | POA: Diagnosis present

## 2016-03-03 DIAGNOSIS — F17210 Nicotine dependence, cigarettes, uncomplicated: Secondary | ICD-10-CM | POA: Diagnosis present

## 2016-03-03 DIAGNOSIS — E785 Hyperlipidemia, unspecified: Secondary | ICD-10-CM | POA: Diagnosis present

## 2016-03-03 DIAGNOSIS — W1830XA Fall on same level, unspecified, initial encounter: Secondary | ICD-10-CM | POA: Diagnosis present

## 2016-03-03 DIAGNOSIS — E1151 Type 2 diabetes mellitus with diabetic peripheral angiopathy without gangrene: Secondary | ICD-10-CM | POA: Diagnosis present

## 2016-03-03 LAB — GLUCOSE, CAPILLARY
GLUCOSE-CAPILLARY: 278 mg/dL — AB (ref 65–99)
Glucose-Capillary: 232 mg/dL — ABNORMAL HIGH (ref 65–99)
Glucose-Capillary: 255 mg/dL — ABNORMAL HIGH (ref 65–99)
Glucose-Capillary: 192 mg/dL — ABNORMAL HIGH (ref 65–99)
Glucose-Capillary: 158 mg/dL — ABNORMAL HIGH (ref 65–99)

## 2016-03-03 MED ORDER — PRO-STAT SUGAR FREE PO LIQD
30.0000 mL | Freq: Three times a day (TID) | ORAL | Status: DC
  Administered 2016-03-03 – 2016-03-04 (×2): 30 mL via ORAL
  Filled 2016-03-03 (×2): qty 30

## 2016-03-03 MED ORDER — INSULIN PUMP
SUBCUTANEOUS | Status: DC
  Administered 2016-03-03 (×2): 6 via SUBCUTANEOUS
  Administered 2016-03-04 (×2): 1.5 via SUBCUTANEOUS
  Filled 2016-03-03: qty 1

## 2016-03-03 MED ORDER — ADULT MULTIVITAMIN W/MINERALS CH
1.0000 | ORAL_TABLET | Freq: Every day | ORAL | Status: DC
  Administered 2016-03-03: 1 via ORAL
  Filled 2016-03-03 (×2): qty 1

## 2016-03-03 NOTE — Progress Notes (Signed)
Initial Nutrition Assessment  DOCUMENTATION CODES:   Not applicable  INTERVENTION:   -MVI daily -30 ml Prostat BID, each supplement provides 100 kcals and 15 grams protein  NUTRITION DIAGNOSIS:   Increased nutrient needs related to wound healing as evidenced by estimated needs.  GOAL:   Patient will meet greater than or equal to 90% of their needs  MONITOR:   PO intake, Supplement acceptance, Labs, Weight trends, Skin, I & O's  REASON FOR ASSESSMENT:   Malnutrition Screening Tool    ASSESSMENT:      Pt admitted for lt BKA revision.   S/p PROCEDURE 03/02/16: Revision Left Below Knee Amputation Application Prevena wound VAC  Attempted to examine pt, however, pt was either receiving nursing care or working with therapy at times of visits.  Pt with good appetite. Meal completion 80%.   Reviewed wt hx. Pt underwent lt BKA on 12/06/15. Wt was stable up until that point. Suspect wt loss may be related to amputations. Pt has experienced 7.4% wt loss over the past 2.5 months.   Pt with wound vac to lt stump. Pt with increased calorie and protein needs for wound healing. Pt will benefit from supplementation and optimal glycemic control to support wound healing.   Case discussed with RN. Pt may discharge today. Per orthopedics notes, likely d/c on 03/04/16.   Labs reviewed: CBGS: 192-334. DM coordinator following. Pt is managing insulin via pump.   Diet Order:  Diet Carb Modified Fluid consistency:: Thin; Room service appropriate?: Yes  Skin:  Wound (see comment) (lt stump wound vac)  Last BM:  03/02/16  Height:   Ht Readings from Last 1 Encounters:  03/02/16 5\' 11"  (1.803 m)    Weight:   Wt Readings from Last 1 Encounters:  03/02/16 150 lb (68.04 kg)    Ideal Body Weight:  73.3 kg  BMI:  Body mass index is 20.93 kg/(m^2).  Estimated Nutritional Needs:   Kcal:  2000-2200  Protein:  100-115 grams  Fluid:  >2.0 L  EDUCATION NEEDS:   No education needs  identified at this time  Sharina Petre A. Mayford KnifeWilliams, RD, LDN, CDE Pager: [phone] After hours Pager: [phone]

## 2016-03-03 NOTE — Progress Notes (Signed)
Subjective: 1 Day Post-Op Procedure(s) (LRB): Revision Left Below Knee Amputation (Left)  No acute events over night. Reports pain well controlled.  Objective:   Body mass index is 20.93 kg/(m^2).  VITALS:  Temp:  [97.3 F (36.3 C)-99 F (37.2 C)] 99 F (37.2 C) (07/11 1334) Pulse Rate:  [87-123] 104 (07/11 1334) Resp:  [13-29] 19 (07/11 1334) BP: (104-132)/(50-74) 132/50 mmHg (07/11 1334) SpO2:  [95 %-100 %] 99 % (07/11 1334)  Neurologically intact Incision: wound vac in place, draining well   LABS  Recent Labs  03/02/16 1530  HGB 13.0  WBC 13.6*  PLT 498*    Recent Labs  03/02/16 1530  NA 133*  K 5.7*  CL 97*  CO2 22  BUN 28*  CREATININE 1.15  GLUCOSE 345*   No results for input(s): LABPT, INR in the last 72 hours.   Assessment/Plan:  -Will change wound vac over to Prevena in am for discharge. -Novolog 0-15 units TID with meals and Novolog 4 units TID with meal for meal coverage discontinued. -As glucose is trending over 200 mg/dl today will changing frequency of CBGs and insulin pump to Q4H.  Plan for discharge tomorrow  Adonis HugueninErin R Zamora, NP 03/03/2016, 3:48 PM

## 2016-03-03 NOTE — Progress Notes (Signed)
Inpatient Diabetes Program Recommendations  AACE/ADA: New Consensus Statement on Inpatient Glycemic Control (2015)  Target Ranges:  Prepandial:   less than 140 mg/dL      Peak postprandial:   less than 180 mg/dL (1-2 hours)      Critically ill patients:  140 - 180 mg/dL   Results for [name], [name] (MRN [id number]) as of 03/03/2016 13:50  Ref. Range 03/02/2016 15:25 03/02/2016 16:23 03/02/2016 18:28 03/02/2016 20:39 03/02/2016 21:56 03/03/2016 02:47 03/03/2016 07:39 03/03/2016 11:46  Glucose-Capillary Latest Ref Range: 65-99 mg/dL 409 (H) 811 (H) 914 (H) 321 (H) 334 (H) 278 (H) 232 (H) 255 (H)   Review of Glycemic Control  Outpatient Diabetes medications: Animus insulin pump with Novolog Current orders for Inpatient glycemic control: Novolog 0-15 units TID with meals, Novolog 4 units TID with meals for meal coverage, Insulin pump ACHS&2am  Inpatient Diabetes Program Recommendations: Correction (SSI):  Patient is using his insulin pump for inpatient glycemic control. Therefore, please discontinue Novolog 0-15 units TID with meals and Novolog 4 units TID with meal for meal coverage. Since glucose is trending over 200 mg/dl today, recommend changing frequency of CBGs and insulin pump to Q4H.  NOTE: Spoke with patient today about his insulin pump and glycemic control. Patient has [facility] with Novolog insulin. Currently wearing and using insulin pump for inpatient glycemic control. Patient reports that his glucose has been trending high over the past week. Inquired about whether patient is doing a correction and meal coverage with his insulin pump and patient states that he has been doing a correction for glucose but he has not been putting in carbohydrates. When inquiring further about why he is not putting in carbohydrates, patient reports that he has not been eating and also he is not sure how many carbohydrates are in the food. Patient still has untouched lunch tray at bedside. Reviewed  meal tray ticket and showed patient that carbohydrates for each food on the tray is listed in parenthesis beside the food listed. Patient reports that he is followed by Dr. Timothy Lasso for diabetes management and he "last seen Dr. Timothy Lasso about 1-2 weeks ago before he got sick and no changes were made to insulin pump settings". Patient states that his last A1C was 8.1% and that prior to last week his glucose is usually less than 180 mg/dl. CBGs have been elevated since admitted and continue trending over 200 mg/dl. Inquired if patient would be agreeable to more frequent glucose monitoring to improve glycemic control and patient states that he would be agreeable to having glucose checked Q4H if MD orders to change frequency as requested.  Encouraged patient to follow up with Dr. Timothy Lasso as an outpatient soon if glucose continues to be elevated after discharge in case insulin pump settings need to be adjusted. Stressed importance of improving glycemic control especially with wound healing. Assisted patient to review insulin pump settings which are as follows:  Basal Rates:  12am: 1.25 units/hr 5am- 1.3 units/hr 8:30am- 0.9 units/hr 4pm- 0.725 units/hr 7pm- 1.0 units/hr  Total Basal Insulin per 24 hours period= 24.73 units  Carbohydrate Ratio: 1 unit for every 15 Grams of Carbohydrates  Correction/Sensitivity Factor: 1 unit for every 50 mg/dl above Target CBG  Target CBG: 120 mg/dl  Discussed recommendations with Mardella Layman, RN and asked to discuss with MD when he rounds. Will continue to follow as an inpatient.  Thanks, Orlando Penner, RN, MSN, CDE Diabetes Coordinator Inpatient Diabetes Program [phone] (Team Pager from 8am to 5pm)  [phone] ([facility] office) [phone] [facility] office) [phone] [facility] office)

## 2016-03-03 NOTE — Evaluation (Signed)
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  Clinical Impression  Patient presents with pain and limited mobility s/p above surgery. Agreeable to transfer to chair this session requiring Min guard assist for safety but declines any walking or use of RW due to hx of falls. Pt lives with g/f and will be staying with her at d/c. Uses w/c for mobility and was in the process of going to OPPT working on gait training with his prosthesis. Instructed pt in exercises. Will follow acutely to maximize independence and mobility prior to return home.    Follow Up Recommendations Supervision for mobility/OOB;No PT follow up    Equipment Recommendations  None recommended by PT    Recommendations for Other Services OT consult     Precautions / Restrictions Precautions Precautions: Fall Precaution Comments: wound vac Restrictions Weight Bearing Restrictions: Yes LLE Weight Bearing: Non weight bearing      Mobility  Bed Mobility Overal bed mobility: Needs Assistance Bed Mobility: Supine to Sit     Supine to sit: Modified independent (Device/Increase time);HOB elevated     General bed mobility comments: Able to get to EOB without assist, use of rail.  Transfers Overall transfer level: Needs assistance   Transfers: Stand Pivot Transfers   Stand pivot transfers: Min guard       General transfer comment: Half SPT bed to chair with Min guard assist for safety. Good technique. Refuses to use RW due to falling when using it x2.   Ambulation/Gait Ambulation/Gait assistance:  (Declined due to falling when using RW in the past. )              Stairs            Wheelchair Mobility    Modified Rankin (Stroke Patients Only)        Balance Overall balance assessment: Needs assistance Sitting-balance support: Feet supported;No upper extremity supported Sitting balance-Leahy Scale: Fair                                       Pertinent Vitals/Pain Pain Assessment: 0-10 Pain Score: 5  Pain Location: left residual limb Pain Descriptors / Indicators: Sore Pain Intervention(s): Monitored during session;Repositioned;Limited activity within patient's tolerance    Home Living Family/patient expects to be discharged to:: Private residence Living Arrangements: Spouse/significant other Available Help at Discharge: Friend(s);Available PRN/intermittently Type of Home: House Home Access: Ramped entrance     Home Layout: One level Home Equipment: Walker - 2 wheels;Bedside commode;Tub bench;Crutches;Wheelchair - manual      Prior Function Level of Independence: Needs assistance   Gait / Transfers Assistance Needed: Uses w/c mostly for mobility. Just got his prosthesis last month and has been wearing it 4 hours/day, going to OPPT  ADL's / Homemaking Assistance Needed: G/f assists with dressing, does his own bathing.        Hand Dominance        Extremity/Trunk Assessment   Upper Extremity Assessment: Defer to OT evaluation           Lower Extremity Assessment: LLE deficits/detail   LLE Deficits / Details: Able to perform QS and SLR without difficulty, some pain.  Communication   Communication: HOH  Cognition Arousal/Alertness: Awake/alert Behavior During Therapy: WFL for tasks assessed/performed Overall Cognitive Status: Within Functional Limits for tasks assessed                      General Comments      Exercises Amputee Exercises Quad Sets: Left;5 reps;Supine Hip ABduction/ADduction: Left;5 reps;Seated Straight Leg Raises: Left;5 reps;Seated      Assessment/Plan    PT Assessment Patient needs continued PT services  PT Diagnosis Acute pain;Generalized  weakness   PT Problem List Decreased strength;Pain;Decreased range of motion;Impaired sensation;Decreased balance;Decreased mobility;Decreased activity tolerance  PT Treatment Interventions Balance training;Functional mobility training;Therapeutic activities;Therapeutic exercise;Wheelchair mobility training;Patient/family education;Gait training   PT Goals (Current goals can be found in the Care Plan section) Acute Rehab PT Goals Patient Stated Goal: to get back on track PT Goal Formulation: With patient Time For Goal Achievement: 03/17/16 Potential to Achieve Goals: Fair    Frequency Min 3X/week   Barriers to discharge        Co-evaluation               End of Session Equipment Utilized During Treatment: Gait belt Activity Tolerance: Patient tolerated treatment well Patient left: in chair;with call bell/phone within reach;with SCD's reapplied Nurse Communication: Mobility status    Functional Assessment Tool Used: clinical judgment Functional Limitation: Mobility: Walking and moving around Mobility: Walking and Moving Around Current Status [phone]): At least 1 percent but less than 20 percent impaired, limited or restricted Mobility: Walking and Moving Around Goal Status [phone]): At least 1 percent but less than 20 percent impaired, limited or restricted    Time: 0981-1914 PT Time Calculation (min) (ACUTE ONLY): 17 min   Charges:   PT Evaluation $PT Eval Moderate Complexity: 1 Procedure     PT G Codes:   PT G-Codes **NOT FOR INPATIENT CLASS** Functional Assessment Tool Used: clinical judgment Functional Limitation: Mobility: Walking and moving around Mobility: Walking and Moving Around Current Status (N8295): At least 1 percent but less than 20 percent impaired, limited or restricted Mobility: Walking and Moving Around Goal Status [phone]): At least 1 percent but less than 20 percent impaired, limited or restricted    Byran Bilotti A Claudia Greenley 03/03/2016, 3:13 PM Mylo Red, PT, DPT [phone]

## 2016-03-03 NOTE — Anesthesia Postprocedure Evaluation (Signed)
Anesthesia Post Note  Patient: [name]  Procedure(s) Performed: Procedure(s) (LRB): Revision Left Below Knee Amputation (Left)  Patient location during evaluation: PACU Anesthesia Type: General Level of consciousness: awake Pain management: pain level controlled Vital Signs Assessment: post-procedure vital signs reviewed and stable Respiratory status: spontaneous breathing Cardiovascular status: stable Postop Assessment: no signs of nausea or vomiting Anesthetic complications: no    Last Vitals:  Filed Vitals:   03/02/16 2009 03/03/16 0421  BP: 113/58 115/53  Pulse: 106 87  Temp: 36.9 C 37.1 C  Resp: 19 29    Last Pain:  Filed Vitals:   03/03/16 0422  PainSc: 5                  Tagan Bartram

## 2016-03-04 ENCOUNTER — Encounter: Payer: BLUE CROSS/BLUE SHIELD | Admitting: Physical Therapy

## 2016-03-04 LAB — GLUCOSE, CAPILLARY
GLUCOSE-CAPILLARY: 170 mg/dL — AB (ref 65–99)
GLUCOSE-CAPILLARY: 195 mg/dL — AB (ref 65–99)
Glucose-Capillary: 153 mg/dL — ABNORMAL HIGH (ref 65–99)
Glucose-Capillary: 169 mg/dL — ABNORMAL HIGH (ref 65–99)

## 2016-03-04 MED ORDER — OXYCODONE-ACETAMINOPHEN 5-325 MG PO TABS: 1.0000 | ORAL_TABLET | ORAL | Status: DC | PRN

## 2016-03-04 NOTE — Discharge Summary (Signed)
Discharge Diagnoses:  Active Problems:   Below knee amputation status (HCC)   Surgeries: Procedure(s): Revision Left Below Knee Amputation on 03/02/2016    Consultants:    Discharged Condition: Improved  Hospital Course: [name] is an 65 y.o. male who was admitted 03/02/2016 with a chief complaint of No chief complaint on file. [name] was was found to have a diagnosis of Osteomyelitis, Abscess Left Below Knee Amputation.  They were brought to the operating room on 03/02/2016 and underwent Procedure(s): Revision Left Below Knee Amputation.    Hospital course was unremarkable. Wound vac changed over to Prevena portable vac for discharge. Will be discharged with home health. Will follow up closely for diabetes management.  They were given perioperative antibiotics: Anti-infectives    Start     Dose/Rate Route Frequency Ordered Stop   03/03/16 0600  ceFAZolin (ANCEF) IVPB 2g/100 mL premix     2 g 200 mL/hr over 30 Minutes Intravenous On call to O.R. 03/02/16 1506 03/02/16 1723   03/03/16 0000  ceFAZolin (ANCEF) IVPB 1 g/50 mL premix     1 g 100 mL/hr over 30 Minutes Intravenous Every 6 hours 03/02/16 2011 03/03/16 1724    .  They were given sequential compression devices and early ambulation for DVT prophylaxis.  Recent vital signs: Patient Vitals for the past 24 hrs:  BP Temp Temp src Pulse Resp SpO2  03/04/16 0519 (!) 134/51 mmHg 99.7 F (37.6 C) Oral 92 (!) 21 95 %  03/03/16 2100 (!) 137/48 mmHg 99.4 F (37.4 C) Oral 90 19 99 %  03/03/16 1334 (!) 132/50 mmHg 99 F (37.2 C) Oral (!) 104 19 99 %  03/03/16 1001 (!) 131/50 mmHg 98.9 F (37.2 C) Oral 93 19 98 %  .  Recent laboratory studies: No results found.  Discharge Medications:     Medication List    TAKE these medications        acetaminophen 500 MG tablet  Commonly known as:  TYLENOL  Take 1,000 mg by mouth 2 (two) times daily as needed (pain).     aspirin EC 81 MG tablet  Take 243 mg by mouth daily.      baclofen 20 MG tablet  Commonly known as:  LIORESAL  Take 20 mg by mouth daily as needed (for leg cramps). Reported on 12/05/2015     calcium carbonate 500 MG chewable tablet  Commonly known as:  TUMS - dosed in mg elemental calcium  Chew 2 tablets by mouth every 6 (six) hours as needed for indigestion or heartburn.     cilostazol 100 MG tablet  Commonly known as:  PLETAL  Take 1 tablet (100 mg total) by mouth 2 (two) times daily.     doxycycline 100 MG capsule  Commonly known as:  VIBRAMYCIN  Take 100 mg by mouth 2 (two) times daily.     metoprolol succinate 25 MG 24 hr tablet  Commonly known as:  TOPROL-XL  Take 1 tablet (25 mg total) by mouth daily.     morphine 15 MG tablet  Commonly known as:  MSIR  Take 1 tablet (15 mg total) by mouth every 4 (four) hours as needed for severe pain.     MULTIVITAMIN GUMMIES ADULT PO  Take 2 tablets by mouth daily.     NOVOLOG 100 UNIT/ML injection  Generic drug:  insulin aspart  Inject into the skin continuous. Via insulin pump     oxyCODONE-acetaminophen 5-325 MG tablet  Commonly known as:  PERCOCET/ROXICET  Take 1 tablet by mouth every 4 (four) hours as needed for severe pain. Reported on 02/13/2016     oxyCODONE-acetaminophen 5-325 MG tablet  Commonly known as:  PERCOCET/ROXICET  Take 1 tablet by mouth every 4 (four) hours as needed for severe pain.     pravastatin 40 MG tablet  Commonly known as:  PRAVACHOL  Take 40 mg by mouth at bedtime.     ramipril 10 MG tablet  Commonly known as:  ALTACE  Take 10 mg by mouth daily.      ASK your doctor about these medications        folic acid 800 MCG tablet  Commonly known as:  FOLVITE  Take 1,600 mcg by mouth daily.        Diagnostic Studies: Dg Ribs Unilateral W/chest Right  02/25/2016  CLINICAL DATA:  Fall 1 day ago. Right lateral upper to mid rib pain. EXAM: RIGHT RIBS AND CHEST - 3+ VIEW COMPARISON:  12/06/2015 FINDINGS: Heart and mediastinal contours are within normal  limits. No focal opacities or effusions. No acute bony abnormality. No visible rib fracture. No pneumothorax. IMPRESSION: No active cardiopulmonary disease. Electronically Signed   By: Charlett Nose M.D.   On: 02/25/2016 09:52   Dg Tibia/fibula Left  02/25/2016  CLINICAL DATA:  Fall 1 day ago, left leg pain EXAM: LEFT TIBIA AND FIBULA - 2 VIEW COMPARISON:  None. FINDINGS: Two views of the left tibia and fibula submitted. The patient is status post amputation of mid and distal tibia and fibula. The remaining proximal tibia and fibula is unremarkable. No acute fracture or subluxation. Atherosclerotic calcifications of femoral and popliteal artery. Mild narrowing of medial joint compartment left knee joint. IMPRESSION: The patient is status post amputation of mid and distal tibia and fibula. The remaining proximal tibia and fibula is unremarkable. No acute fracture or subluxation. Electronically Signed   By: Natasha Mead M.D.   On: 02/25/2016 09:53    They benefited maximally from their hospital stay and there were no complications.     Disposition: 01-Home or Self Care      Follow-up Information    Follow up with Kristle Wesch V, MD In 2 weeks.   Specialty:  Orthopedic Surgery   Contact information:   [address] [place] [address] [phone]       Follow up with Joani Cosma V, MD. Schedule an appointment as soon as possible for a visit in 1 week.   Specialty:  Orthopedic Surgery   Contact information:   [address] [place] [address] [phone]        Signed: Adonis Huguenin 03/04/2016, 7:45 AM

## 2016-03-04 NOTE — Progress Notes (Signed)
Discharge home. Home discharge instruction given. [facility] representative called by CM due to prevena vac not working prior to discharge. [facility] rep checked and got the dressing in . Instructed patient that if machine is not working for him to change dressing with dry dressing, kerlix,abdominal pad and ace wrap until his next appointment with Dr. Lajoyce Cornersuda.

## 2016-03-04 NOTE — Consult Note (Addendum)
WOC assistance requested; called by bedside nurse to troubleshoot Prevena negative pressure device.  Pt was previously on a hospital Vac and is preparing to discharge soon.  Negative pressure dressing intact to left postop-stump wound and is not ordered to be changed.  Bedside nurse attached Prevena machine but it was "reving and not maintaining a seal." Assessed dressing and located a leak when palpating.  Applied additional drape and good suction was obtained with the Prevena portable suction device.  Discussed with bedside nurse; if it is unable to provide adequate pressure to maintain a seal, then please contact Dr Lajoyce Cornersuda for further orders. Please re-consult if further assistance is needed.  Thank-you,  Cammie Mcgeeawn Majestic Molony MSN, RN, CWOCN, [place], CNS [phone]

## 2016-03-06 ENCOUNTER — Encounter: Payer: BLUE CROSS/BLUE SHIELD | Admitting: Physical Therapy

## 2016-03-09 ENCOUNTER — Encounter: Payer: BLUE CROSS/BLUE SHIELD | Admitting: Physical Therapy

## 2016-03-12 ENCOUNTER — Encounter: Payer: BLUE CROSS/BLUE SHIELD | Admitting: Physical Therapy

## 2016-03-16 ENCOUNTER — Encounter: Payer: BLUE CROSS/BLUE SHIELD | Admitting: Physical Therapy

## 2016-03-19 ENCOUNTER — Encounter: Payer: BLUE CROSS/BLUE SHIELD | Admitting: Physical Therapy

## 2016-03-23 ENCOUNTER — Encounter: Payer: BLUE CROSS/BLUE SHIELD | Admitting: Physical Therapy

## 2016-03-30 ENCOUNTER — Encounter: Payer: BLUE CROSS/BLUE SHIELD | Admitting: Physical Therapy

## 2016-04-06 ENCOUNTER — Encounter: Payer: BLUE CROSS/BLUE SHIELD | Admitting: Physical Therapy

## 2016-04-09 ENCOUNTER — Ambulatory Visit: Payer: BLUE CROSS/BLUE SHIELD | Admitting: Physical Therapy

## 2016-04-13 ENCOUNTER — Encounter: Payer: BLUE CROSS/BLUE SHIELD | Admitting: Physical Therapy

## 2016-04-20 ENCOUNTER — Encounter: Payer: BLUE CROSS/BLUE SHIELD | Admitting: Physical Therapy

## 2016-04-28 ENCOUNTER — Encounter: Payer: BLUE CROSS/BLUE SHIELD | Admitting: Physical Therapy

## 2016-05-04 ENCOUNTER — Encounter: Payer: Self-pay | Admitting: Physical Therapy

## 2016-05-04 ENCOUNTER — Ambulatory Visit: Payer: BLUE CROSS/BLUE SHIELD | Attending: Family | Admitting: Physical Therapy

## 2016-05-04 ENCOUNTER — Encounter: Payer: BLUE CROSS/BLUE SHIELD | Admitting: Physical Therapy

## 2016-05-04 DIAGNOSIS — R29898 Other symptoms and signs involving the musculoskeletal system: Secondary | ICD-10-CM | POA: Insufficient documentation

## 2016-05-04 DIAGNOSIS — G546 Phantom limb syndrome with pain: Secondary | ICD-10-CM | POA: Diagnosis present

## 2016-05-04 DIAGNOSIS — R2681 Unsteadiness on feet: Secondary | ICD-10-CM

## 2016-05-04 DIAGNOSIS — R296 Repeated falls: Secondary | ICD-10-CM | POA: Diagnosis present

## 2016-05-04 DIAGNOSIS — R2689 Other abnormalities of gait and mobility: Secondary | ICD-10-CM | POA: Insufficient documentation

## 2016-05-04 NOTE — Therapy (Signed)
[facility] [address] [place], [place], [address] Phone: [phone]   Fax:  [phone]  Physical Therapy Evaluation  Patient Details  Name: [name] MRN: [id number] Date of Birth: [birth year] Referring Provider: Aldean Baker, MD  Encounter Date: 05/04/2016      PT End of Session - 05/04/16 1010    Visit Number 1   Number of Visits 9   Date for PT Re-Evaluation 07/10/16   Authorization Type [employer]   Authorization - Visit Number 5   Authorization - Number of Visits 30   PT Start Time 0930   PT Stop Time 1003   PT Time Calculation (min) 33 min   Equipment Utilized During Treatment Gait belt   Activity Tolerance Patient tolerated treatment well   Behavior During Therapy WFL for tasks assessed/performed      Past Medical History:  Diagnosis Date  . Cigarette smoker   . Coronary artery disease   . Diabetes mellitus    type 1  x 50 yrs  . Diabetic retinopathy (HCC)   . Dyslipidemia   . Peripheral vascular disease [facility])     Past Surgical History:  Procedure Laterality Date  . ABDOMINAL ANGIOGRAM N/A 05/29/2014   Procedure: ABDOMINAL ANGIOGRAM;  Surgeon: Pamella Pert, MD;  Location: [facility] CATH LAB;  Service: Cardiovascular;  Laterality: N/A;  . AMPUTATION Left 10/25/2015   Procedure: Left Foot 5th Ray Amputation;  Surgeon: Nadara Mustard, MD;  Location: [facility] OR;  Service: Orthopedics;  Laterality: Left;  . AMPUTATION Left 12/06/2015   Procedure: AMPUTATION BELOW KNEE;  Surgeon: Nadara Mustard, MD;  Location: [facility] OR;  Service: Orthopedics;  Laterality: Left;  . CARDIAC CATHETERIZATION     EF is 55-60% and no wall motion abnormalities (long long time ago)  . FEMORAL-POPLITEAL BYPASS GRAFT Left 10/24/2015   Procedure: BYPASS GRAFT FEMORAL below knee POPLITEAL ARTERY with Left Saphenous Vein;  Surgeon: Nada Libman, MD;  Location: [facility] OR;  Service: Vascular;  Laterality: Left;  . FRACTURE SURGERY     left arm  "many yrs ago"  . LOWER EXTREMITY ANGIOGRAM N/A 01/23/2014   Procedure: LOWER EXTREMITY ANGIOGRAM;  Surgeon: Pamella Pert, MD;  Location: [facility] CATH LAB;  Service: Cardiovascular;  Laterality: N/A;  . LOWER EXTREMITY ANGIOGRAM N/A 07/31/2014   Procedure: LOWER EXTREMITY ANGIOGRAM;  Surgeon: Pamella Pert, MD;  Location: [facility] CATH LAB;  Service: Cardiovascular;  Laterality: N/A;  . LOWER EXTREMITY ANGIOGRAM Left 10/11/2015   Procedure: Lower Extremity Angiogram;  Surgeon: Nada Libman, MD;  Location: [facility] INVASIVE CV LAB;  Service: Cardiovascular;  Laterality: Left;  . PERIPHERAL VASCULAR CATHETERIZATION Left 10/11/2015   Procedure: Peripheral Vascular Balloon Angioplasty;  Surgeon: Nada Libman, MD;  Location: [facility] INVASIVE CV LAB;  Service: Cardiovascular;  Laterality: Left;  sfa failed unable to cross occluded sfa  . PERIPHERAL VASCULAR CATHETERIZATION N/A 10/11/2015   Procedure: Abdominal Aortogram;  Surgeon: Nada Libman, MD;  Location: [facility] INVASIVE CV LAB;  Service: Cardiovascular;  Laterality: N/A;  . STUMP REVISION Left 03/02/2016   Procedure: Revision Left Below Knee Amputation;  Surgeon: Nadara Mustard, MD;  Location: [facility] OR;  Service: Orthopedics;  Laterality: Left;    There were no vitals filed for this visit.       Subjective Assessment - 05/04/16 0932    Subjective The patient is a 65 yr old male here following a L transtibial amputation on 12/06/15. He recieved his first  prosthesis  on 02/05/16 and is dependent in proper use & care. He is currently living with his girlfriend due to ampuation and would like to move back into his condo that is two stories. He works as a Warehouse manager. He owns 3 tractors and 2 bob cats and can not get in either of these devices. He reports phantom limb pain every time he is wearing prosthesis but is currently in no pain. Phantom limb pain at its worse is a 9/10 and this occurs when walking with prosthesis, the patient relieves this pain by  taking the leg off. He had revision of Transtibial Amputation on 03/02/2016. He got new socket ~3 weeks ago.    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 Yes   Pain Score 0-No pain  in last week, worst 3/10    Pain Location Leg   Pain Orientation Left   Pain Descriptors / Indicators Burning   Pain Type Acute pain   Pain Onset 1 to 4 weeks ago   Pain Frequency Intermittent   Aggravating Factors  standing on prosthesis   Pain Relieving Factors sitting or removing prosthesis            [facility] PT Assessment - 05/04/16 0930      Assessment   Medical Diagnosis L transtibial amputation    Referring Provider Aldean Baker, MD   Onset Date/Surgical Date 05/18/16  socket revision delivery with limb revision   Hand Dominance Right     Precautions   Precautions None     Balance Screen   Has the patient fallen in the past 6 months Yes   How many times? 1  low blood sugar   Has the patient had a decrease in activity level because of a fear of falling?  No   Is the patient reluctant to leave their home because of a fear of falling?  No     Home Environment   Living Environment Private residence   Living Arrangements Spouse/significant other   Type of Home House   Home Access Ramped entrance   Home Layout One level   Home Equipment Wheelchair - manual;Walker - 2 wheels;Tub bench     Prior Function   Level of Independence Independent     Posture/Postural Control   Posture/Postural Control Postural limitations   Postural Limitations Rounded Shoulders;Forward head;Flexed trunk;Weight shift right     AROM   Overall AROM  Within functional limits for tasks performed     Strength   Overall Strength Within functional limits for tasks performed     Transfers   Transfers Sit to Stand;Stand to Sit   Sit to Stand 6:  Modified independent (Device/Increase time);Without upper extremity assist;From chair/3-in-1   Stand to Sit 6: Modified independent (Device/Increase time);Without upper extremity assist;To chair/3-in-1     Ambulation/Gait   Ambulation/Gait Yes   Ambulation/Gait Assistance 5: Supervision;4: Min guard  Min Guard for occassional balance loss w/FGA   Ambulation Distance (Feet) 350 Feet   Assistive device Prosthesis;None   Gait Pattern Step-through pattern;Decreased arm swing - left;Decreased step length - right;Decreased stance time - left;Decreased stride length;Decreased hip/knee flexion - left;Decreased weight shift to left;Decreased dorsiflexion - right;Left hip hike;Abducted - left   Ambulation Surface Indoor;Level   Gait velocity 3.16 ft/sec   Stairs Yes   Stairs Assistance 5:  Supervision   Stair Management Technique Two rails;One rail Right;Step to pattern;Forwards   Number of Stairs 4   Ramp 5: Supervision  prosthesis only   Curb 5: Supervision  prosthesis only     Berg Balance Test   Sit to Stand Able to stand without using hands and stabilize independently   Standing Unsupported Able to stand safely 2 minutes   Sitting with Back Unsupported but Feet Supported on Floor or Stool Able to sit safely and securely 2 minutes   Stand to Sit Sits safely with minimal use of hands   Transfers Able to transfer safely, minor use of hands   Standing Unsupported with Eyes Closed Able to stand 10 seconds safely   Standing Ubsupported with Feet Together Able to place feet together independently and stand 1 minute safely   From Standing, Reach Forward with Outstretched Arm Can reach confidently >25 cm (10")   From Standing Position, Pick up Object from Floor Able to pick up shoe safely and easily   From Standing Position, Turn to Look Behind Over each Shoulder Looks behind from both sides and weight shifts well   Turn 360 Degrees Able to turn 360 degrees safely but slowly   Standing Unsupported,  Alternately Place Feet on Step/Stool Able to complete >2 steps/needs minimal assist   Standing Unsupported, One Foot in Front Able to plae foot ahead of the other independently and hold 30 seconds   Standing on One Leg Tries to lift leg/unable to hold 3 seconds but remains standing independently   Total Score 47     Functional Gait  Assessment   Gait assessed  Yes   Gait Level Surface Walks 20 ft in less than 7 sec but greater than 5.5 sec, uses assistive device, slower speed, mild gait deviations, or deviates 6-10 in outside of the 12 in walkway width.   Change in Gait Speed Makes only minor adjustments to walking speed, or accomplishes a change in speed with significant gait deviations, deviates 10-15 in outside the 12 in walkway width, or changes speed but loses balance but is able to recover and continue walking.   Gait with Horizontal Head Turns Performs head turns with moderate changes in gait velocity, slows down, deviates 10-15 in outside 12 in walkway width but recovers, can continue to walk.   Gait with Vertical Head Turns Performs task with slight change in gait velocity (eg, minor disruption to smooth gait path), deviates 6 - 10 in outside 12 in walkway width or uses assistive device   Gait and Pivot Turn Pivot turns safely in greater than 3 sec and stops with no loss of balance, or pivot turns safely within 3 sec and stops with mild imbalance, requires small steps to catch balance.   Step Over Obstacle Is able to step over one shoe box (4.5 in total height) without changing gait speed. No evidence of imbalance.   Gait with Narrow Base of Support Ambulates less than 4 steps heel to toe or cannot perform without assistance.   Gait with Eyes Closed Walks 20 ft, slow speed, abnormal gait pattern, evidence for imbalance, deviates 10-15 in outside 12 in walkway width. Requires more than 9 sec to ambulate 20 ft.   Ambulating Backwards Walks 20 ft, slow speed, abnormal gait pattern, evidence for  imbalance, deviates 10-15 in outside 12 in walkway width.   Steps Two feet to a stair, must use rail.   Total Score 13  Prosthetics Assessment - 05/04/16 0930      Prosthetics   Prosthetic Care Independent with Prosthetic cleaning   Prosthetic Care Dependent with Skin check;Residual limb care;Care of non-amputated limb;Correct ply sock adjustment;Proper wear schedule/adjustment;Proper weight-bearing schedule/adjustment   Donning prosthesis  Supervision  cues on technique   Current prosthetic wear tolerance (days/week)  daily   Current prosthetic wear tolerance (#hours/day)  wearing all awake hours   Current prosthetic weight-bearing tolerance (hours/day)  Tolerated standing & gait for 10 min with distal limb pain increasing from 0/10 to 2/10.   Edema none   Residual limb condition  cylinderical, no open areas, 3 areas of redness along incision, normal hair growth, temperature, mositure level   K code/activity level with prosthetic use  K4 full community variable cadence, high level activities                  [facility] Adult PT Treatment/Exercise - 05/04/16 0930      Prosthetics   Education Provided Proper Donning   Person(s) Educated Patient   Education Method Explanation;Demonstration;Verbal cues   Education Method Verbalized understanding;Needs further instruction                  PT Short Term Goals - 05/04/16 1455      PT SHORT TERM GOAL #1   Title Patient verbalizes & demonstrates proper donning & adjusting wear /weight bearing to manage pain in residual limb. (Target Date: 06/04/2016)   Time 4   Period Weeks   Status New     PT SHORT TERM GOAL #2   Title Pt will able to ambualte 500' on paved surfaces with prosthesis with supervision (Target Date: 06/04/2016   Time 4   Period Weeks   Status New     PT SHORT TERM GOAL #3   Title Pt will be able to negoitate ramps, curbs, grass, slopes & stairs (1 rail reciprocally) with supervision with  prosthesis. (Target Date: 06/04/2016   Time 4   Period Weeks   Status New     PT SHORT TERM GOAL #4   Title Patient able to alternate step without UE support >10 steps without balance loss. (Target Date: 06/04/2016   Time 4   Period Weeks   Status New     PT SHORT TERM GOAL #5   Title Patient reports pain increases <2 increments with standing & gait activities. (Target Date: 06/04/2016   Time 4   Period Weeks   Status New           PT Long Term Goals - 05/04/16 1459      PT LONG TERM GOAL #1   Title Pt will wear prosthesis >90 % of all awake hours with no new wounds to indicate increased fucntional use with prosthesis (Target Date: 07/03/2016   Time 8   Period Weeks   Status New     PT LONG TERM GOAL #2   Title Pt will be indepedent in prosthetic and residual limb care to enable increased use of prosthesis to improve activity level (Target Date: 07/03/2016   Time 8   Period Weeks   Status New     PT LONG TERM GOAL #3   Title Pt will be able to ambulate >1500' independent with prosthesis only including ramps, curbs, stairs (no rails step-to & 1 rail reciprocal), grass, pavement and level surfaces to indicate 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 (Target Date: 07/03/2016   Time 8   Period Weeks  Status New     PT LONG TERM GOAL #4   Title Patient able to perform floor transfer, lift 30#, carry 15#, push, pull and climb A-frame ladder with prosthesis only independently. (Target Date: 07/03/2016   Time 8   Period Weeks   Status New     PT LONG TERM GOAL #5   Title Pt will be improve Fuctional Gait Assessment will improve from 13/30 to >/= 24/30 to indicate decreased risk for falls (Target Date: 07/03/2016   Time 8   Period Weeks   Status New     PT LONG TERM GOAL #6   Title Pt will increase Berg Balance Score from 49/56 to >/= 52/56 to indicate a decreased risk for falls (Target Date: 07/03/2016   Time 8   Period Weeks   Status On-going      PT LONG TERM GOAL #7   Title Patient reports pain increases </= 2 increments with standing & gait activities >30 minutes. (Target Date: 07/03/2016   Time 8   Period Weeks   Status New               Plan - 05/04/16 1355    Clinical Impression Statement This patient was learning to use his first prosthesis and fell on his residual limb. The wound became infected and he required a revision surgery to his residual limb. His limb healed after surgery and he recieved a new socket to fit new residual limb size & shape ~2 weeks ago. He presents to PT to learn to use his prosthesis and return to prior (before initial amputation) level of function. He was farming including Counselling psychologistcattle management. He lived alone in house with lots of stairs and is currently living with his girlfriend due to limited stairs. He has pain in distal tibia with standing and gait activities limiting tolerance. His balance has high fall risk as noted by Sharlene MottsBerg Balance score of 49/56. Functional Gait Assessment 13/30 indicates high fall risk with gait activities especially on uneven terrain. Patient is unknowledgeable in lifting, carrying, pushing, pulling, climbing (like a tractor) to enable to return to work. Patient's condition is evolving and plan of care is moderate.     Rehab Potential Good   Clinical Impairments Affecting Rehab Potential 4 wounds on residual limb with Type 1 Diabetes > 50 years   PT Frequency 1x / week   PT Duration 8 weeks   PT Treatment/Interventions ADLs/Self Care Home Management;Therapeutic exercise;Manual techniques;Therapeutic activities;Functional mobility training;Stair training;Gait training;DME Instruction;Balance training;Neuromuscular re-education;Patient/family education;Prosthetic Training;Scar mobilization   PT Next Visit Plan review prosthetic care, prosthetic gait, lifting & carrying     Consulted and Agree with Plan of Care Patient      Patient will benefit from skilled therapeutic  intervention in order to improve the following deficits and impairments:  Abnormal gait, Decreased activity tolerance, Decreased balance, Decreased mobility, Postural dysfunction, Decreased knowledge of use of DME, Decreased knowledge of precautions, Decreased endurance, Decreased safety awareness, Prosthetic Dependency, Pain, Improper body mechanics  Visit Diagnosis: Other abnormalities of gait and mobility - Plan: PT plan of care cert/re-cert  Unsteadiness on feet - Plan: PT plan of care cert/re-cert  Other symptoms and signs involving the musculoskeletal system - Plan: PT plan of care cert/re-cert  Phantom limb syndrome with pain (HCC) - Plan: PT plan of care cert/re-cert  Repeated falls - Plan: PT plan of care cert/re-cert     Problem List Patient Active Problem List   Diagnosis Date Noted  . Below  knee amputation status (HCC) 12/06/2015  . PAD (peripheral artery disease) (HCC) 10/24/2015  . Diabetes mellitus with peripheral artery disease (HCC) 07/30/2014  . Claudication in peripheral vascular disease (HCC) 07/30/2014  . IDDM (insulin dependent diabetes mellitus) (HCC) 01/23/2013  . CAD (coronary artery disease) 04/15/2011  . Hyperlipidemia 04/15/2011    Kory Rains PT, DPT 05/04/2016, 3:13 PM  [facility] [address] [place], [place], [address] Phone: [phone]   Fax:  [phone]  Name: [name] MRN: [id number] Date of Birth: 23-Jul-1951

## 2016-05-11 ENCOUNTER — Ambulatory Visit: Payer: BLUE CROSS/BLUE SHIELD | Admitting: Physical Therapy

## 2016-05-11 ENCOUNTER — Encounter: Payer: Self-pay | Admitting: Physical Therapy

## 2016-05-11 DIAGNOSIS — R2689 Other abnormalities of gait and mobility: Secondary | ICD-10-CM | POA: Diagnosis not present

## 2016-05-11 DIAGNOSIS — G546 Phantom limb syndrome with pain: Secondary | ICD-10-CM

## 2016-05-11 DIAGNOSIS — R2681 Unsteadiness on feet: Secondary | ICD-10-CM

## 2016-05-11 DIAGNOSIS — R29898 Other symptoms and signs involving the musculoskeletal system: Secondary | ICD-10-CM

## 2016-05-11 NOTE — Therapy (Signed)
[facility] [address] [place], [place], [address] Phone: [phone]   Fax:  [phone]  Physical Therapy Treatment  Patient Details  Name: [name] MRN: [id number] Date of Birth: 28-Nov-1950 Referring Provider: Aldean Baker, MD  Encounter Date: 05/11/2016      PT End of Session - 05/11/16 1305    Visit Number 2   Number of Visits 9   Date for PT Re-Evaluation 07/10/16   Authorization Type BCBS   Authorization - Visit Number 6   Authorization - Number of Visits 30   PT Start Time [phone]   PT Stop Time 1015   PT Time Calculation (min) 44 min   Equipment Utilized During Treatment Gait belt   Activity Tolerance Patient tolerated treatment well   Behavior During Therapy [facility] for tasks assessed/performed      Past Medical History:  Diagnosis Date  . Cigarette smoker   . Coronary artery disease   . Diabetes mellitus    type 1  x 50 yrs  . Diabetic retinopathy (HCC)   . Dyslipidemia   . Peripheral vascular disease [facility])     Past Surgical History:  Procedure Laterality Date  . ABDOMINAL ANGIOGRAM N/A 05/29/2014   Procedure: ABDOMINAL ANGIOGRAM;  Surgeon: Pamella Pert, MD;  Location: [facility] CATH LAB;  Service: Cardiovascular;  Laterality: N/A;  . AMPUTATION Left 10/25/2015   Procedure: Left Foot 5th Ray Amputation;  Surgeon: Nadara Mustard, MD;  Location: [facility] OR;  Service: Orthopedics;  Laterality: Left;  . AMPUTATION Left 12/06/2015   Procedure: AMPUTATION BELOW KNEE;  Surgeon: Nadara Mustard, MD;  Location: [facility] OR;  Service: Orthopedics;  Laterality: Left;  . CARDIAC CATHETERIZATION     EF is 55-60% and no wall motion abnormalities (long long time ago)  . FEMORAL-POPLITEAL BYPASS GRAFT Left 10/24/2015   Procedure: BYPASS GRAFT FEMORAL below knee POPLITEAL ARTERY with Left Saphenous Vein;  Surgeon: Nada Libman, MD;  Location: [facility] OR;  Service: Vascular;  Laterality: Left;  . FRACTURE SURGERY     left arm  "many yrs ago"  . LOWER EXTREMITY ANGIOGRAM N/A 01/23/2014   Procedure: LOWER EXTREMITY ANGIOGRAM;  Surgeon: Pamella Pert, MD;  Location: [facility] CATH LAB;  Service: Cardiovascular;  Laterality: N/A;  . LOWER EXTREMITY ANGIOGRAM N/A 07/31/2014   Procedure: LOWER EXTREMITY ANGIOGRAM;  Surgeon: Pamella Pert, MD;  Location: [facility] CATH LAB;  Service: Cardiovascular;  Laterality: N/A;  . LOWER EXTREMITY ANGIOGRAM Left 10/11/2015   Procedure: Lower Extremity Angiogram;  Surgeon: Nada Libman, MD;  [facility]: [facility] INVASIVE CV LAB;  Service: Cardiovascular;  Laterality: Left;  . PERIPHERAL VASCULAR CATHETERIZATION Left 10/11/2015   Procedure: Peripheral Vascular Balloon Angioplasty;  Surgeon: Nada Libman, MD;  [facility]: [facility] INVASIVE CV LAB;  Service: Cardiovascular;  Laterality: Left;  sfa failed unable to cross occluded sfa  . PERIPHERAL VASCULAR CATHETERIZATION N/A 10/11/2015   Procedure: Abdominal Aortogram;  Surgeon: Nada Libman, MD;  [facility] INVASIVE CV LAB;  Service: Cardiovascular;  Laterality: N/A;  . STUMP REVISION Left 03/02/2016   Procedure: Revision Left Below Knee Amputation;  Surgeon: Nadara Mustard, MD;  Location: [facility] OR;  Service: Orthopedics;  Laterality: Left;    There were no vitals filed for this visit.      Subjective Assessment - 05/11/16 0928    Subjective He has been wearing prosthesis all awake hours with pain in distal tibia starting about lunch time.    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 Yes   Pain Score 0-No pain  Pain in evenings up to 10/10 for second then 6-7/10   Pain Location Leg  distal tibia   Pain Orientation Left   Pain Descriptors / Indicators Burning   Pain Type Acute pain   Pain Onset 1 to 4 weeks ago   Pain Frequency Intermittent   Aggravating Factors  standing on prosthesis    Pain Relieving Factors sitting or removing prosthesis   Effect of Pain on Daily Activities limits standing                         [facility] Adult PT Treatment/Exercise - 05/11/16 0930      Ambulation/Gait   Ambulation/Gait Yes   Ambulation/Gait Assistance 5: Supervision   Ambulation/Gait Assistance Details visual, verbal to proprioceptive cues on proper step width to decrease abduction of prosthesis   Ambulation Distance (Feet) 400 Feet   Assistive device Prosthesis;None   Gait Pattern Step-through pattern;Decreased arm swing - left;Decreased step length - right;Decreased stance time - left;Decreased stride length;Decreased hip/knee flexion - left;Decreased weight shift to left;Decreased dorsiflexion - right;Left hip hike;Abducted - left   Ambulation Surface Indoor;Level   Stairs Yes   Stairs Assistance 5: Supervision   Stairs Assistance Details (indicate cue type and reason) demo & verbal cues on step width, wt shift & reciprocal technique with TTA prosthesis   Stair Management Technique Two rails;One rail Right;Alternating pattern;Forwards   Number of Stairs 4  10 reps   Ramp 5: Supervision  prosthesis only   Ramp Details (indicate cue type and reason) demo & verbal cues on posture & wt shift   Curb 5: Supervision  prosthesis only   Curb Details (indicate cue type and reason) demo & verbal cues on step length / position & wt shift, leading with either LE      High Level Balance   High Level Balance Activities Head turns   High Level Balance Comments tactile & verbal cues to maintain path & pace     Prosthetics   Prosthetic Care Comments  cues on signs of sweating with need to dry limb/liner, use of Tegaderm on open blister; adjusted ply socks with proprioception to correct ply   Current prosthetic wear tolerance (days/week)  daily   Current prosthetic wear tolerance (#hours/day)  wearing all awake hours   Residual limb condition  blister open with protective skin  off at distal limb 16mm X 29 mm. Used mirror for pt to see. He was unaware of area until PT pointed it out.    Education Provided Skin check;Residual limb care;Correct ply sock adjustment;Proper Donning;Proper wear schedule/adjustment  see prosthetic care   Person(s) Educated Patient   Education Method Explanation;Demonstration;Tactile cues;Verbal cues   Education Method Verbalized understanding;Returned demonstration;Tactile cues required;Verbal cues required;Needs further instruction                PT Education - 05/11/16 0945    Education provided Yes   Education Details circulation pain in RLE and rest with moderate or 6-7/10 pain in limb.    Person(s) Educated Patient   Methods Verbal cues;Explanation   Comprehension Verbalized understanding;Verbal cues required          PT Short Term Goals - 05/11/16 1306      PT  SHORT TERM GOAL #1   Title Patient verbalizes & demonstrates proper donning & adjusting wear /weight bearing to manage pain in residual limb. (Target Date: 06/04/2016)   Time 4   Period Weeks   Status On-going     PT SHORT TERM GOAL #2   Title Pt will able to ambualte 500' on paved surfaces with prosthesis with supervision (Target Date: 06/04/2016   Time 4   Period Weeks   Status On-going     PT SHORT TERM GOAL #3   Title Pt will be able to negoitate ramps, curbs, grass, slopes & stairs (1 rail reciprocally) with supervision with prosthesis. (Target Date: 06/04/2016   Time 4   Period Weeks   Status On-going     PT SHORT TERM GOAL #4   Title Patient able to alternate step without UE support >10 steps without balance loss. (Target Date: 06/04/2016   Time 4   Period Weeks   Status On-going     PT SHORT TERM GOAL #5   Title Patient reports pain increases <2 increments with standing & gait activities. (Target Date: 06/04/2016   Time 4   Period Weeks   Status On-going           PT Long Term Goals - 05/11/16 1306      PT LONG TERM GOAL #1    Title Pt will wear prosthesis >90 % of all awake hours with no new wounds to indicate increased fucntional use with prosthesis (Target Date: 07/03/2016   Time 8   Period Weeks   Status On-going     PT LONG TERM GOAL #2   Title Pt will be indepedent in prosthetic and residual limb care to enable increased use of prosthesis to improve activity level (Target Date: 07/03/2016   Time 8   Period Weeks   Status On-going     PT LONG TERM GOAL #3   Title Pt will be able to ambulate >1500' independent with prosthesis only including ramps, curbs, stairs (no rails step-to & 1 rail reciprocal), grass, pavement and level surfaces to indicate 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 (Target Date: 07/03/2016   Time 8   Period Weeks   Status On-going     PT LONG TERM GOAL #4   Title Patient able to perform floor transfer, lift 30#, carry 15#, push, pull and climb A-frame ladder with prosthesis only independently. (Target Date: 07/03/2016   Time 8   Period Weeks   Status On-going     PT LONG TERM GOAL #5   Title Pt will be improve Fuctional Gait Assessment will improve from 13/30 to >/= 24/30 to indicate decreased risk for falls (Target Date: 07/03/2016   Time 8   Period Weeks   Status On-going     PT LONG TERM GOAL #6   Title Pt will increase Berg Balance Score from 49/56 to >/= 52/56 to indicate a decreased risk for falls (Target Date: 07/03/2016   Time 8   Period Weeks   Status On-going     PT LONG TERM GOAL #7   Title Patient reports pain increases </= 2 increments with standing & gait activities >30 minutes. (Target Date: 07/03/2016   Time 8   Period Weeks   Status On-going               Plan - 05/11/16 1306    Clinical Impression Statement Pt appears to have a better understanding of skin checking, sweat management and adjusting ply  socks after skilled instruction. Patient improved gait with skilled instruction in proper step width and technique on barriers.     Rehab Potential Good   Clinical Impairments Affecting Rehab Potential 4 wounds on residual limb with Type 1 Diabetes > 50 years   PT Frequency 1x / week   PT Duration 8 weeks   PT Treatment/Interventions ADLs/Self Care Home Management;Therapeutic exercise;Manual techniques;Therapeutic activities;Functional mobility training;Stair training;Gait training;DME Instruction;Balance training;Neuromuscular re-education;Patient/family education;Prosthetic Training;Scar mobilization   PT Next Visit Plan review prosthetic care, prosthetic gait, lifting & carrying     Consulted and Agree with Plan of Care Patient      Patient will benefit from skilled therapeutic intervention in order to improve the following deficits and impairments:  Abnormal gait, Decreased activity tolerance, Decreased balance, Decreased mobility, Postural dysfunction, Decreased knowledge of use of DME, Decreased knowledge of precautions, Decreased endurance, Decreased safety awareness, Prosthetic Dependency, Pain, Improper body mechanics  Visit Diagnosis: Other abnormalities of gait and mobility  Unsteadiness on feet  Other symptoms and signs involving the musculoskeletal system  Phantom limb syndrome with pain [facility])     Problem List Patient Active Problem List   Diagnosis Date Noted  . Below knee amputation status (HCC) 12/06/2015  . PAD (peripheral artery disease) (HCC) 10/24/2015  . Diabetes mellitus with peripheral artery disease (HCC) 07/30/2014  . Claudication in peripheral vascular disease (HCC) 07/30/2014  . IDDM (insulin dependent diabetes mellitus) (HCC) 01/23/2013  . CAD (coronary artery disease) 04/15/2011  . Hyperlipidemia 04/15/2011    Silveria Botz  PT, DPT 05/11/2016, 1:11 PM  [facility] [address] [place], [place], [address] Phone: [phone]   Fax:  [phone]  Name: [name] MRN: [id number] Date of Birth:  [birth year]

## 2016-05-12 ENCOUNTER — Other Ambulatory Visit: Payer: Self-pay | Admitting: Cardiology

## 2016-05-19 ENCOUNTER — Encounter: Payer: Self-pay | Admitting: Physical Therapy

## 2016-05-19 ENCOUNTER — Ambulatory Visit: Payer: BLUE CROSS/BLUE SHIELD | Admitting: Physical Therapy

## 2016-05-19 DIAGNOSIS — R2689 Other abnormalities of gait and mobility: Secondary | ICD-10-CM | POA: Diagnosis not present

## 2016-05-19 DIAGNOSIS — R296 Repeated falls: Secondary | ICD-10-CM

## 2016-05-19 DIAGNOSIS — R2681 Unsteadiness on feet: Secondary | ICD-10-CM

## 2016-05-19 DIAGNOSIS — R29898 Other symptoms and signs involving the musculoskeletal system: Secondary | ICD-10-CM

## 2016-05-19 DIAGNOSIS — G546 Phantom limb syndrome with pain: Secondary | ICD-10-CM

## 2016-05-20 NOTE — Therapy (Signed)
[facility] [address] [place], [place], [address] Phone: [phone]   Fax:  [phone]  Physical Therapy Treatment  Patient Details  Name: [name] MRN: [id number] Date of Birth: 09/13/1950 Referring Provider: Aldean BakerMarcus Duda, MD  Encounter Date: 05/19/2016      PT End of Session - 05/19/16 0809    Visit Number 3   Number of Visits 9   Date for PT Re-Evaluation 07/10/16   Authorization Type BCBS   Authorization - Visit Number 7   Authorization - Number of Visits 30   PT Start Time 0803   PT Stop Time 0845   PT Time Calculation (min) 42 min   Equipment Utilized During Treatment Gait belt   Activity Tolerance Patient tolerated treatment well   Behavior [facility] for tasks assessed/performed      Past Medical History:  Diagnosis Date  . Cigarette smoker   . Coronary artery disease   . Diabetes mellitus    type 1  x 50 yrs  . Diabetic retinopathy (HCC)   . Dyslipidemia   . Peripheral vascular disease [facility](HCC)     Past Surgical History:  Procedure Laterality Date  . ABDOMINAL ANGIOGRAM N/A 05/29/2014   Procedure: ABDOMINAL ANGIOGRAM;  Surgeon: Pamella PertJagadeesh R Ganji, MD;  [facility];  Service: Cardiovascular;  Laterality: N/A;  . AMPUTATION Left 10/25/2015   Procedure: Left Foot 5th Ray Amputation;  Surgeon: Nadara MustardMarcus Duda V, MD;  [facility];  Service: Orthopedics;  Laterality: Left;  . AMPUTATION Left 12/06/2015   Procedure: AMPUTATION BELOW KNEE;  Surgeon: Nadara MustardMarcus Duda V, MD;  Location: [facility] OR;  Service: Orthopedics;  Laterality: Left;  . CARDIAC CATHETERIZATION     EF is 55-60% and no wall motion abnormalities (long long time ago)  . FEMORAL-POPLITEAL BYPASS GRAFT Left 10/24/2015   Procedure: BYPASS GRAFT FEMORAL below knee POPLITEAL ARTERY with Left Saphenous Vein;  Surgeon: Nada LibmanVance W Brabham, MD;  [facility];  Service: Vascular;  Laterality: Left;  . FRACTURE SURGERY     left arm  "many yrs ago"  . LOWER EXTREMITY ANGIOGRAM N/A 01/23/2014   Procedure: LOWER EXTREMITY ANGIOGRAM;  Surgeon: Pamella PertJagadeesh R Ganji, MD;  [facility];  Service: Cardiovascular;  Laterality: N/A;  . LOWER EXTREMITY ANGIOGRAM N/A 07/31/2014   Procedure: LOWER EXTREMITY ANGIOGRAM;  Surgeon: Pamella PertJagadeesh R Ganji, MD;  [facility];  Service: Cardiovascular;  Laterality: N/A;  . LOWER EXTREMITY ANGIOGRAM Left 10/11/2015   Procedure: Lower Extremity Angiogram;  Surgeon: Nada LibmanVance W Brabham, MD;  [facility]: [facility] INVASIVE CV LAB;  Service: Cardiovascular;  Laterality: Left;  . PERIPHERAL VASCULAR CATHETERIZATION Left 10/11/2015   Procedure: Peripheral Vascular Balloon Angioplasty;  Surgeon: Nada LibmanVance W Brabham, MD;  [facility]: [facility] INVASIVE CV LAB;  Service: Cardiovascular;  Laterality: Left;  sfa failed unable to cross occluded sfa  . PERIPHERAL VASCULAR CATHETERIZATION N/A 10/11/2015   Procedure: Abdominal Aortogram;  Surgeon: Nada LibmanVance W Brabham, MD;  [facility]: [facility] INVASIVE CV LAB;  Service: Cardiovascular;  Laterality: N/A;  . STUMP REVISION Left 03/02/2016   Procedure: Revision Left Below Knee Amputation;  Surgeon: Nadara MustardMarcus V Duda, MD;  Location: [facility] OR;  Service: Orthopedics;  Laterality: Left;    There were no vitals filed for this visit.      Subjective Assessment - 05/19/16 0807    Subjective Reports increased pain yesterday so he has added a pad of his own on top of the liner.    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 Yes            [facility] Adult PT Treatment/Exercise - 05/19/16 0811      Transfers   Transfers Sit to Stand;Stand to Sit   Sit to Stand 6: Modified independent (Device/Increase time);Without upper extremity assist;From chair/3-in-1   Stand to Sit 6: Modified independent (Device/Increase time);Without upper extremity  assist;To chair/3-in-1     Ambulation/Gait   Ambulation/Gait Yes   Ambulation/Gait Assistance 5: Supervision   Ambulation Distance (Feet) 350 Feet  x1, plus short distances while adjusting socks   Assistive device Prosthesis;None   Gait Pattern Step-through pattern;Decreased stride length;Antalgic;Decreased arm swing - left;Decreased stance time - left;Decreased weight shift to left   Ambulation Surface Level;Indoor   Ramp 5: Supervision  prosthesis only   Ramp Details (indicate cue type and reason) cues needed on posture and step length   Curb 5: Supervision  prosthesis only     Prosthetics   Prosthetic Care Comments  reports drying every 2 hours as advised. window cut into 3 ply sock in session to off load area of pain with pt reporting less pain/pressure afterwards. less antalgic gait pattern afterwards. Pt also educated on use of antipersperant, including Drysol (how to get it and use it). Education provided on reasoning behind off loading areas of pain and that adding pads to these areas will actually increase pressure and pain. Pt verbalized understanding of all education provided today.                                  Current prosthetic wear tolerance (days/week)  daily   Current prosthetic wear tolerance (#hours/day)  wearing all awake hours   Edema none   Residual limb condition  blister at distal end is closed, appears to be healing. dry skin present at distal end of limb as well.               PT Short Term Goals - 05/11/16 1306      PT SHORT TERM GOAL #1   Title Patient verbalizes & demonstrates proper donning & adjusting wear /weight bearing to manage pain in residual limb. (Target Date: 06/04/2016)   Time 4   Period Weeks   Status On-going     PT SHORT TERM GOAL #2   Title Pt will able to ambualte 500' on paved surfaces with prosthesis with supervision (Target Date: 06/04/2016   Time 4   Period Weeks   Status On-going     PT SHORT TERM GOAL #3   Title Pt  will be able to negoitate ramps, curbs, grass, slopes & stairs (1 rail reciprocally) with supervision with prosthesis. (Target Date: 06/04/2016   Time 4   Period Weeks   Status On-going     PT SHORT TERM GOAL #4   Title Patient able to alternate step without UE support >10 steps without balance loss. (Target Date: 06/04/2016   Time 4   Period Weeks   Status On-going     PT SHORT TERM GOAL #5   Title Patient reports pain increases <2 increments with standing & gait activities. (Target Date: 06/04/2016   Time 4   Period Weeks   Status On-going  PT Long Term Goals - 05/11/16 1306      PT LONG TERM GOAL #1   Title Pt will wear prosthesis >90 % of all awake hours with no new wounds to indicate increased fucntional use with prosthesis (Target Date: 07/03/2016   Time 8   Period Weeks   Status On-going     PT LONG TERM GOAL #2   Title Pt will be indepedent in prosthetic and residual limb care to enable increased use of prosthesis to improve activity level (Target Date: 07/03/2016   Time 8   Period Weeks   Status On-going     PT LONG TERM GOAL #3   Title Pt will be able to ambulate >1500' independent with prosthesis only including ramps, curbs, stairs (no rails step-to & 1 rail reciprocal), grass, pavement and level surfaces to indicate 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 (Target Date: 07/03/2016   Time 8   Period Weeks   Status On-going     PT LONG TERM GOAL #4   Title Patient able to perform floor transfer, lift 30#, carry 15#, push, pull and climb A-frame ladder with prosthesis only independently. (Target Date: 07/03/2016   Time 8   Period Weeks   Status On-going     PT LONG TERM GOAL #5   Title Pt will be improve Fuctional Gait Assessment will improve from 13/30 to >/= 24/30 to indicate decreased risk for falls (Target Date: 07/03/2016   Time 8   Period Weeks   Status On-going     PT LONG TERM GOAL #6   Title Pt will increase Berg  Balance Score from 49/56 to >/= 52/56 to indicate a decreased risk for falls (Target Date: 07/03/2016   Time 8   Period Weeks   Status On-going     PT LONG TERM GOAL #7   Title Patient reports pain increases </= 2 increments with standing & gait activities >30 minutes. (Target Date: 07/03/2016   Time 8   Period Weeks   Status On-going            Plan - 05/19/16 0810    Clinical Impression Statement today's session continued to focus on prosthetic management and use.  Pt with decreased antalgic gait pattern after sock ply adjustment. Pt is making steady progress toward goals.    Rehab Potential Good   Clinical Impairments Affecting Rehab Potential 4 wounds on residual limb with Type 1 Diabetes > 50 years   PT Frequency 1x / week   PT Duration 8 weeks   PT Treatment/Interventions ADLs/Self Care Home Management;Therapeutic exercise;Manual techniques;Therapeutic activities;Functional mobility training;Stair training;Gait training;DME Instruction;Balance training;Neuromuscular re-education;Patient/family education;Prosthetic Training;Scar mobilization   PT Next Visit Plan review prosthetic care, prosthetic gait, lifting & carrying     Consulted and Agree with Plan of Care Patient      Patient will benefit from skilled therapeutic intervention in order to improve the following deficits and impairments:  Abnormal gait, Decreased activity tolerance, Decreased balance, Decreased mobility, Postural dysfunction, Decreased knowledge of use of DME, Decreased knowledge of precautions, Decreased endurance, Decreased safety awareness, Prosthetic Dependency, Pain, Improper body mechanics  Visit Diagnosis: Other abnormalities of gait and mobility  Unsteadiness on feet  Other symptoms and signs involving the musculoskeletal system  Phantom limb syndrome with pain (HCC)  Repeated falls     Problem List Patient Active Problem List   Diagnosis Date Noted  . Below knee amputation status  (HCC) 12/06/2015  . PAD (peripheral artery disease) (HCC) 10/24/2015  .  Diabetes mellitus with peripheral artery disease (HCC) 07/30/2014  . Claudication in peripheral vascular disease (HCC) 07/30/2014  . IDDM (insulin dependent diabetes mellitus) (HCC) 01/23/2013  . CAD (coronary artery disease) 04/15/2011  . Hyperlipidemia 04/15/2011    Sallyanne Kuster, PTA, [facility] [address], [address] [place], [place] [address] [phone] 05/20/16, 1:57 PM   Name: [name] MRN: [id number] Date of Birth: 10/12/1950

## 2016-05-25 ENCOUNTER — Ambulatory Visit: Payer: BLUE CROSS/BLUE SHIELD | Attending: Family | Admitting: Physical Therapy

## 2016-05-25 ENCOUNTER — Encounter: Payer: Self-pay | Admitting: Physical Therapy

## 2016-05-25 DIAGNOSIS — R2689 Other abnormalities of gait and mobility: Secondary | ICD-10-CM | POA: Diagnosis not present

## 2016-05-25 DIAGNOSIS — R296 Repeated falls: Secondary | ICD-10-CM | POA: Diagnosis present

## 2016-05-25 DIAGNOSIS — G546 Phantom limb syndrome with pain: Secondary | ICD-10-CM | POA: Diagnosis present

## 2016-05-25 DIAGNOSIS — R29898 Other symptoms and signs involving the musculoskeletal system: Secondary | ICD-10-CM

## 2016-05-25 DIAGNOSIS — R2681 Unsteadiness on feet: Secondary | ICD-10-CM | POA: Insufficient documentation

## 2016-05-25 NOTE — Therapy (Signed)
[facility] [address] [place], [place], [address] Phone: [phone]   Fax:  [phone]  Physical Therapy Treatment  Patient Details  Name: [name] MRN: [id number] Date of Birth: February 02, 1951 Referring Provider: Aldean Baker, MD  Encounter Date: 05/25/2016      PT End of Session - 05/25/16 1025    Visit Number 4   Number of Visits 9   Date for PT Re-Evaluation 07/10/16   Authorization Type BCBS   Authorization - Visit Number 8   Authorization - Number of Visits 30   PT Start Time 1018   PT Stop Time 1100   PT Time Calculation (min) 42 min   Equipment Utilized During Treatment Gait belt   Activity Tolerance Patient tolerated treatment well   Behavior During Therapy WFL for tasks assessed/performed      Past Medical History:  Diagnosis Date  . Cigarette smoker   . Coronary artery disease   . Diabetes mellitus    type 1  x 50 yrs  . Diabetic retinopathy (HCC)   . Dyslipidemia   . Peripheral vascular disease [facility])     Past Surgical History:  Procedure Laterality Date  . ABDOMINAL ANGIOGRAM N/A 05/29/2014   Procedure: ABDOMINAL ANGIOGRAM;  Surgeon: Pamella Pert, MD;  Location: [facility] CATH LAB;  Service: Cardiovascular;  Laterality: N/A;  . AMPUTATION Left 10/25/2015   Procedure: Left Foot 5th Ray Amputation;  Surgeon: Nadara Mustard, MD;  Location: [facility] OR;  Service: Orthopedics;  Laterality: Left;  . AMPUTATION Left 12/06/2015   Procedure: AMPUTATION BELOW KNEE;  Surgeon: Nadara Mustard, MD;  [facility]: [facility] OR;  Service: Orthopedics;  Laterality: Left;  . CARDIAC CATHETERIZATION     EF is 55-60% and no wall motion abnormalities (long long time ago)  . FEMORAL-POPLITEAL BYPASS GRAFT Left 10/24/2015   Procedure: BYPASS GRAFT FEMORAL below knee POPLITEAL ARTERY with Left Saphenous Vein;  Surgeon: Nada Libman, MD;  Location: [facility] OR;  Service: Vascular;  Laterality: Left;  . FRACTURE SURGERY     left arm  "many yrs ago"  . LOWER EXTREMITY ANGIOGRAM N/A 01/23/2014   Procedure: LOWER EXTREMITY ANGIOGRAM;  Surgeon: Pamella Pert, MD;  Location: [facility] CATH LAB;  Service: Cardiovascular;  Laterality: N/A;  . LOWER EXTREMITY ANGIOGRAM N/A 07/31/2014   Procedure: LOWER EXTREMITY ANGIOGRAM;  Surgeon: Pamella Pert, MD;  Location: [facility] CATH LAB;  Service: Cardiovascular;  Laterality: N/A;  . LOWER EXTREMITY ANGIOGRAM Left 10/11/2015   Procedure: Lower Extremity Angiogram;  Surgeon: Nada Libman, MD;  Location: [facility] INVASIVE CV LAB;  Service: Cardiovascular;  Laterality: Left;  . PERIPHERAL VASCULAR CATHETERIZATION Left 10/11/2015   Procedure: Peripheral Vascular Balloon Angioplasty;  Surgeon: Nada Libman, MD;  Location: [facility] INVASIVE CV LAB;  Service: Cardiovascular;  Laterality: Left;  sfa failed unable to cross occluded sfa  . PERIPHERAL VASCULAR CATHETERIZATION N/A 10/11/2015   Procedure: Abdominal Aortogram;  Surgeon: Nada Libman, MD;  [facility]: [facility] INVASIVE CV LAB;  Service: Cardiovascular;  Laterality: N/A;  . STUMP REVISION Left 03/02/2016   Procedure: Revision Left Below Knee Amputation;  Surgeon: Nadara Mustard, MD;  Location: [facility] OR;  Service: Orthopedics;  Laterality: Left;    There were no vitals filed for this visit.      Subjective Assessment - 05/25/16 1022    Subjective Was not able to see the prosthetist last week due to they were doing invetory and not available. See's Chris on Thursday of  this week. Still having increased pain as day progresses, no pain in mornings.    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 No/denies   Pain Score 0-No pain             [facility] Adult PT Treatment/Exercise - 05/25/16 1026      Transfers   Transfers Sit to Stand;Stand to Sit   Sit to Stand 6: Modified independent (Device/Increase  time);Without upper extremity assist;From chair/3-in-1   Stand to Sit 6: Modified independent (Device/Increase time);Without upper extremity assist;To chair/3-in-1     Ambulation/Gait   Ambulation/Gait Yes   Ambulation/Gait Assistance 5: Supervision   Ambulation/Gait Assistance Details cues for equal step length and stance time. cues needed to decrease abdcution of prosthesis during gait. utilized blue/white line of tiles to assit with this for visual feedback, followed by use of mirror for visual feedback with insession carryover noted.   Ambulation Distance (Feet) 345 Feet  x1   Assistive device Prosthesis;None   Gait Pattern Step-through pattern;Decreased stride length;Antalgic;Decreased arm swing - left;Decreased stance time - left;Decreased weight shift to left;Abducted - left   Ambulation Surface Level;Indoor   Ramp 5: Supervision  x 3 reps   Ramp Details (indicate cue type and reason) cues needed for stride length and for weight shifting   Curb 5: Supervision   Curb Details (indicate cue type and reason) x 1 rep with cues on sequencing   Gait Comments gait along ~50 foot hallway: fwd gait with head movements up<>down, left<>right x 3-4 laps each with min guard to supervision for balance, cues on posture and base of support (to pull prosthsis in vs abducted out).                        High Level Balance   High Level Balance Activities Marching forwards;Marching backwards;Tandem walking  tandem gait fwd/bwd    High Level Balance Comments in parallel bars for light UE support as needed. cues on posture and weight shifting with each activity. Performed each one for 3 laps each way.     Neuro Re-ed    Neuro Re-ed Details  gait along hall ways scanning for playing cards, pt able to find 5 of 10 without balance issues noted, cues to maintain cadence.     Prosthetics   Current prosthetic wear tolerance (days/week)  daily   Current prosthetic wear tolerance (#hours/day)  wearing all awake  hours   Residual limb condition  skin is intact with no issues notes, some dry skin present at end of limb   Donning Prosthesis Supervision   Doffing Prosthesis Supervision             PT Short Term Goals - 05/11/16 1306      PT SHORT TERM GOAL #1   Title Patient verbalizes & demonstrates proper donning & adjusting wear /weight bearing to manage pain in residual limb. (Target Date: 06/04/2016)   Time 4   Period Weeks   Status On-going     PT SHORT TERM GOAL #2   Title Pt will able to ambualte 500' on paved surfaces with prosthesis with supervision (Target Date: 06/04/2016   Time 4   Period Weeks   Status On-going     PT SHORT TERM GOAL #3   Title Pt will be able to  negoitate ramps, curbs, grass, slopes & stairs (1 rail reciprocally) with supervision with prosthesis. (Target Date: 06/04/2016   Time 4   Period Weeks   Status On-going     PT SHORT TERM GOAL #4   Title Patient able to alternate step without UE support >10 steps without balance loss. (Target Date: 06/04/2016   Time 4   Period Weeks   Status On-going     PT SHORT TERM GOAL #5   Title Patient reports pain increases <2 increments with standing & gait activities. (Target Date: 06/04/2016   Time 4   Period Weeks   Status On-going           PT Long Term Goals - 05/11/16 1306      PT LONG TERM GOAL #1   Title Pt will wear prosthesis >90 % of all awake hours with no new wounds to indicate increased fucntional use with prosthesis (Target Date: 07/03/2016   Time 8   Period Weeks   Status On-going     PT LONG TERM GOAL #2   Title Pt will be indepedent in prosthetic and residual limb care to enable increased use of prosthesis to improve activity level (Target Date: 07/03/2016   Time 8   Period Weeks   Status On-going     PT LONG TERM GOAL #3   Title Pt will be able to ambulate >1500' independent with prosthesis only including ramps, curbs, stairs (no rails step-to & 1 rail reciprocal), grass, pavement  and level surfaces to indicate 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 (Target Date: 07/03/2016   Time 8   Period Weeks   Status On-going     PT LONG TERM GOAL #4   Title Patient able to perform floor transfer, lift 30#, carry 15#, push, pull and climb A-frame ladder with prosthesis only independently. (Target Date: 07/03/2016   Time 8   Period Weeks   Status On-going     PT LONG TERM GOAL #5   Title Pt will be improve Fuctional Gait Assessment will improve from 13/30 to >/= 24/30 to indicate decreased risk for falls (Target Date: 07/03/2016   Time 8   Period Weeks   Status On-going     PT LONG TERM GOAL #6   Title Pt will increase Berg Balance Score from 49/56 to >/= 52/56 to indicate a decreased risk for falls (Target Date: 07/03/2016   Time 8   Period Weeks   Status On-going     PT LONG TERM GOAL #7   Title Patient reports pain increases </= 2 increments with standing & gait activities >30 minutes. (Target Date: 07/03/2016   Time 8   Period Weeks   Status On-going           Plan - 05/25/16 1025    Clinical Impression Statement Today's skilled session continued to work on mobility, gait and balance with use of prosthesis only. Pt continues to be challenged with balance activites and demo's decreased weight bearing/shifitng onto prosthesis, mostly due to pain. Pt to see prosthetist this Thursday to have adjustments made for decreased pain with wear and weight bearing. Pt is making steady progress and should benefit from continued PT to progress toward unmet goals.                                    Rehab Potential Good   Clinical Impairments Affecting Rehab Potential 4  wounds on residual limb with Type 1 Diabetes > 50 years   PT Frequency 1x / week   PT Duration 8 weeks   PT Treatment/Interventions ADLs/Self Care Home Management;Therapeutic exercise;Manual techniques;Therapeutic activities;Functional mobility training;Stair training;Gait training;DME  Instruction;Balance training;Neuromuscular re-education;Patient/family education;Prosthetic Training;Scar mobilization   PT Next Visit Plan review prosthetic care, prosthetic gait, lifting & carrying     Consulted and Agree with Plan of Care Patient      Patient will benefit from skilled therapeutic intervention in order to improve the following deficits and impairments:  Abnormal gait, Decreased activity tolerance, Decreased balance, Decreased mobility, Postural dysfunction, Decreased knowledge of use of DME, Decreased knowledge of precautions, Decreased endurance, Decreased safety awareness, Prosthetic Dependency, Pain, Improper body mechanics  Visit Diagnosis: Other abnormalities of gait and mobility  Unsteadiness on feet  Other symptoms and signs involving the musculoskeletal system  Phantom limb syndrome with pain (HCC)  Repeated falls     Problem List Patient Active Problem List   Diagnosis Date Noted  . Below knee amputation status (HCC) 12/06/2015  . PAD (peripheral artery disease) (HCC) 10/24/2015  . Diabetes mellitus with peripheral artery disease (HCC) 07/30/2014  . Claudication in peripheral vascular disease (HCC) 07/30/2014  . IDDM (insulin dependent diabetes mellitus) (HCC) 01/23/2013  . CAD (coronary artery disease) 04/15/2011  . Hyperlipidemia 04/15/2011    Sallyanne Kuster, PTA, [facility] [address], [address] [place], [place] [address] [phone] 05/25/16, 1:52 PM   Name: SHARIF RENDELL MRN: [id number] Date of Birth: 05/10/1951

## 2016-06-01 ENCOUNTER — Ambulatory Visit: Payer: BLUE CROSS/BLUE SHIELD | Admitting: Physical Therapy

## 2016-06-01 DIAGNOSIS — R2689 Other abnormalities of gait and mobility: Secondary | ICD-10-CM | POA: Diagnosis not present

## 2016-06-01 DIAGNOSIS — R2681 Unsteadiness on feet: Secondary | ICD-10-CM

## 2016-06-01 DIAGNOSIS — R29898 Other symptoms and signs involving the musculoskeletal system: Secondary | ICD-10-CM

## 2016-06-01 DIAGNOSIS — G546 Phantom limb syndrome with pain: Secondary | ICD-10-CM

## 2016-06-02 NOTE — Therapy (Signed)
[facility] [address] [place], [place], [address] Phone: [phone]   Fax:  [phone]  Physical Therapy Treatment  Patient Details  Name: [name] MRN: [id number] Date of Birth: 08-26-1950 Referring Provider: Meridee Score, MD  Encounter Date: 06/01/2016      PT End of Session - 06/01/16 1015    Visit Number 5   Number of Visits 9   Date for PT Re-Evaluation 07/10/16   Authorization Type BCBS   Authorization - Visit Number 9   Authorization - Number of Visits 30   PT Start Time [phone]   PT Stop Time 1014   PT Time Calculation (min) 43 min   Equipment Utilized During Treatment Gait belt   Activity Tolerance Patient tolerated treatment well   Behavior During Therapy [facility] for tasks assessed/performed      Past Medical History:  Diagnosis Date  . Cigarette smoker   . Coronary artery disease   . Diabetes mellitus    type 1  x 50 yrs  . Diabetic retinopathy ([place])   . Dyslipidemia   . Peripheral vascular disease [facility])     Past Surgical History:  Procedure Laterality Date  . ABDOMINAL ANGIOGRAM N/A 05/29/2014   Procedure: ABDOMINAL ANGIOGRAM;  Surgeon: Laverda Page, MD;  Location: [facility] CATH LAB;  Service: Cardiovascular;  Laterality: N/A;  . AMPUTATION Left 10/25/2015   Procedure: Left Foot 5th Ray Amputation;  Surgeon: Newt Minion, MD;  Location: [place];  Service: Orthopedics;  Laterality: Left;  . AMPUTATION Left 12/06/2015   Procedure: AMPUTATION BELOW KNEE;  Surgeon: Newt Minion, MD;  Location: [place];  Service: Orthopedics;  Laterality: Left;  . CARDIAC CATHETERIZATION     EF is 55-60% and no wall motion abnormalities (long long time ago)  . FEMORAL-POPLITEAL BYPASS GRAFT Left 10/24/2015   Procedure: BYPASS GRAFT FEMORAL below knee POPLITEAL ARTERY with Left Saphenous Vein;  Surgeon: Serafina Mitchell, MD;  Location: [place];  Service: Vascular;  Laterality: Left;  . FRACTURE SURGERY     left arm  "many yrs ago"  . LOWER EXTREMITY ANGIOGRAM N/A 01/23/2014   Procedure: LOWER EXTREMITY ANGIOGRAM;  Surgeon: Laverda Page, MD;  Location: [facility] CATH LAB;  Service: Cardiovascular;  Laterality: N/A;  . LOWER EXTREMITY ANGIOGRAM N/A 07/31/2014   Procedure: LOWER EXTREMITY ANGIOGRAM;  Surgeon: Laverda Page, MD;  Location: [facility] CATH LAB;  Service: Cardiovascular;  Laterality: N/A;  . LOWER EXTREMITY ANGIOGRAM Left 10/11/2015   Procedure: Lower Extremity Angiogram;  Surgeon: Serafina Mitchell, MD;  Location: [place] CV LAB;  Service: Cardiovascular;  Laterality: Left;  . PERIPHERAL VASCULAR CATHETERIZATION Left 10/11/2015   Procedure: Peripheral Vascular Balloon Angioplasty;  Surgeon: Serafina Mitchell, MD;  Location: [place] CV LAB;  Service: Cardiovascular;  Laterality: Left;  sfa failed unable to cross occluded sfa  . PERIPHERAL VASCULAR CATHETERIZATION N/A 10/11/2015   Procedure: Abdominal Aortogram;  Surgeon: Serafina Mitchell, MD;  Location: [place] CV LAB;  Service: Cardiovascular;  Laterality: N/A;  . STUMP REVISION Left 03/02/2016   Procedure: Revision Left Below Knee Amputation;  Surgeon: Newt Minion, MD;  Location: [place];  Service: Orthopedics;  Laterality: Left;    There were no vitals filed for this visit.      Subjective Assessment - 06/01/16 0930    Subjective He reports that changes made by prosthetist has corrected pain issues. Questions on vacuum seal prosthesis.    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 No/denies                         [facility] Adult PT Treatment/Exercise - 06/01/16 0930      Transfers   Transfers Sit to Stand;Stand to Sit   Sit to Stand 6: Modified independent (Device/Increase time);Without upper extremity assist;From chair/3-in-1   Stand to Sit 6: Modified independent  (Device/Increase time);Without upper extremity assist;To chair/3-in-1     Ambulation/Gait   Ambulation/Gait Yes   Ambulation/Gait Assistance 5: Supervision   Ambulation/Gait Assistance Details Demo, verbal & visual with mirrors cues on proper step width & wt shift over prosthesis in stance.    Ambulation Distance (Feet) 500 Feet  500' X 2   Assistive device Prosthesis;None   Gait Pattern Step-through pattern;Decreased stride length;Antalgic;Decreased arm swing - left;Decreased weight shift to left;Abducted - left   Ambulation Surface Indoor;Level   Stairs Yes   Stairs Assistance 5: Supervision   Stairs Assistance Details (indicate cue type and reason) cues on proper sep width and wt shift   Stair Management Technique One rail Left;Alternating pattern;Forwards   Number of Stairs 4  5 reps   Ramp 5: Supervision  prosthesis only,    Ramp Details (indicate cue type and reason) cues on posture & wt shift   Curb 5: Supervision  prosthesis only   Curb Details (indicate cue type and reason) cues on proper step width, step length /foot postion & maintaining momentrum    Gait Comments worked on increasing gait velocity with quicker, longer steps in 50' hall 2 reps.     High Level Balance   High Level Balance Activities Figure 8 turns;Backward walking;Negotitating around obstacles;Negotiating over obstacles  tandem gait fwd/bwd    High Level Balance Comments demo, verbal cues on technique with prosthesis.      Self-Care   Self-Care Lifting;ADL's   ADL's Demo & verbal cues on foot position with wt shift between feet for vacuuming, weed eating, etc. with prosthesis   Lifting PT demo, instructed in technque with prosthesis. Pt return demo with verbal cues with supervision.      Neuro Re-ed    Neuro Re-ed Details  --     Prosthetics   Current prosthetic wear tolerance (days/week)  daily   Current prosthetic wear tolerance (#hours/day)  wearing all awake hours   Residual limb condition  no  wounds or skin issues noted.    Education Provided Skin check  see pt education   Person(s) Educated Patient   Education Method Explanation;Verbal cues   Education Method Verbalized understanding;Verbal cues required                PT Education - 06/01/16 0930    Education provided Yes   Education Details Vacuum seal prostheses pros & cons and use of old socket with Stomper Jr foot for "dirty or wet leg"   Person(s) Educated Patient   Methods Explanation;Verbal cues   Comprehension Verbalized understanding          PT Short Term Goals - 06/01/16 1015      PT SHORT TERM GOAL #1   Title Patient verbalizes & demonstrates proper donning & adjusting wear /weight bearing to manage pain in residual limb. (Target Date: 06/04/2016)   Baseline MET 06/01/2016   Time 4  Period Weeks   Status Achieved     PT SHORT TERM GOAL #2   Title Pt will able to ambualte 500' on paved surfaces with prosthesis with supervision (Target Date: 06/04/2016   Baseline MET 06/01/2016   Time 4   Period Weeks   Status Achieved     PT SHORT TERM GOAL #3   Title Pt will be able to negoitate ramps, curbs, grass, slopes & stairs (1 rail reciprocally) with supervision with prosthesis. (Target Date: 06/04/2016   Baseline MET 06/01/2016   Time 4   Period Weeks   Status Achieved     PT SHORT TERM GOAL #4   Title Patient able to alternate step without UE support >10 steps without balance loss. (Target Date: 06/04/2016   Baseline MET 06/01/2016   Time 4   Period Weeks   Status Achieved     PT SHORT TERM GOAL #5   Title Patient reports pain increases <2 increments with standing & gait activities. (Target Date: 06/04/2016   Baseline MET 06/01/2016   Time 4   Period Weeks   Status Achieved           PT Long Term Goals - 05/11/16 1306      PT LONG TERM GOAL #1   Title Pt will wear prosthesis >90 % of all awake hours with no new wounds to indicate increased fucntional use with prosthesis (Target  Date: 07/03/2016   Time 8   Period Weeks   Status On-going     PT LONG TERM GOAL #2   Title Pt will be indepedent in prosthetic and residual limb care to enable increased use of prosthesis to improve activity level (Target Date: 07/03/2016   Time 8   Period Weeks   Status On-going     PT LONG TERM GOAL #3   Title Pt will be able to ambulate >1500' independent with prosthesis only including ramps, curbs, stairs (no rails step-to & 1 rail reciprocal), grass, pavement and level surfaces to indicate 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 (Target Date: 07/03/2016   Time 8   Period Weeks   Status On-going     PT LONG TERM GOAL #4   Title Patient able to perform floor transfer, lift 30#, carry 15#, push, pull and climb A-frame ladder with prosthesis only independently. (Target Date: 07/03/2016   Time 8   Period Weeks   Status On-going     PT LONG TERM GOAL #5   Title Pt will be improve Fuctional Gait Assessment will improve from 13/30 to >/= 24/30 to indicate decreased risk for falls (Target Date: 07/03/2016   Time 8   Period Weeks   Status On-going     PT LONG TERM GOAL #6   Title Pt will increase Berg Balance Score from 49/56 to >/= 52/56 to indicate a decreased risk for falls (Target Date: 07/03/2016   Time 8   Period Weeks   Status On-going     PT LONG TERM GOAL #7   Title Patient reports pain increases </= 2 increments with standing & gait activities >30 minutes. (Target Date: 07/03/2016   Time 8   Period Weeks   Status On-going               Plan - 06/01/16 1500    Clinical Impression Statement Patient met all STGs. He had no pain with gait activities today. Patient reports better understanding of advanced activities.    Rehab Potential Good   Clinical  Impairments Affecting Rehab Potential 4 wounds on residual limb with Type 1 Diabetes > 50 years   PT Frequency 1x / week   PT Duration 8 weeks   PT Treatment/Interventions ADLs/Self Care Home  Management;Therapeutic exercise;Manual techniques;Therapeutic activities;Functional mobility training;Stair training;Gait training;DME Instruction;Balance training;Neuromuscular re-education;Patient/family education;Prosthetic Training;Scar mobilization   PT Next Visit Plan review prosthetic care, prosthetic gait, lifting & carrying     Consulted and Agree with Plan of Care Patient      Patient will benefit from skilled therapeutic intervention in order to improve the following deficits and impairments:  Abnormal gait, Decreased activity tolerance, Decreased balance, Decreased mobility, Postural dysfunction, Decreased knowledge of use of DME, Decreased knowledge of precautions, Decreased endurance, Decreased safety awareness, Prosthetic Dependency, Pain, Improper body mechanics  Visit Diagnosis: Other abnormalities of gait and mobility  Unsteadiness on feet  Other symptoms and signs involving the musculoskeletal system  Phantom limb syndrome with pain [facility])     Problem List Patient Active Problem List   Diagnosis Date Noted  . Below knee amputation status ([place]) 12/06/2015  . PAD (peripheral artery disease) ([place]) 10/24/2015  . Diabetes mellitus with peripheral artery disease ([place]) 07/30/2014  . Claudication in peripheral vascular disease ([place]) 07/30/2014  . IDDM (insulin dependent diabetes mellitus) ([place]) 01/23/2013  . CAD (coronary artery disease) 04/15/2011  . Hyperlipidemia 04/15/2011    Jamey Reas PT, DPT 06/02/2016, 7:21 AM  [facility] [address] [place], [place], [address] Phone: [phone]   Fax:  [phone]  Name: [name] MRN: [id number] Date of Birth: 1951/06/03

## 2016-06-05 ENCOUNTER — Ambulatory Visit (HOSPITAL_COMMUNITY)
Admission: RE | Admit: 2016-06-05 | Discharge: 2016-06-05 | Disposition: A | Discharge status: Home / Self Care | Payer: BLUE CROSS/BLUE SHIELD | Source: Ambulatory Visit | Attending: Vascular Surgery | Admitting: Vascular Surgery

## 2016-06-05 ENCOUNTER — Other Ambulatory Visit: Payer: Self-pay | Admitting: Cardiology

## 2016-06-05 ENCOUNTER — Ambulatory Visit (INDEPENDENT_AMBULATORY_CARE_PROVIDER_SITE_OTHER)
Admission: RE | Admit: 2016-06-05 | Discharge: 2016-06-05 | Disposition: A | Discharge status: Home / Self Care | Payer: BLUE CROSS/BLUE SHIELD | Source: Ambulatory Visit | Attending: Vascular Surgery | Admitting: Vascular Surgery

## 2016-06-05 DIAGNOSIS — Z89512 Acquired absence of left leg below knee: Secondary | ICD-10-CM | POA: Diagnosis not present

## 2016-06-05 DIAGNOSIS — E1151 Type 2 diabetes mellitus with diabetic peripheral angiopathy without gangrene: Secondary | ICD-10-CM | POA: Insufficient documentation

## 2016-06-05 DIAGNOSIS — I739 Peripheral vascular disease, unspecified: Secondary | ICD-10-CM | POA: Diagnosis present

## 2016-06-05 LAB — VAS US LOWER EXTREMITY ARTERIAL DUPLEX
RATIBMIDSYS: -52 cm/s
RATIBPSV: 43 cm/s
RPEROMIDSYS: 29 cm/s
RTIBMIDSYS: 43 cm/s
Right popliteal dist sys PSV: -53 cm/s
Right popliteal prox sys PSV: 45 cm/s
Right post tibial sys PSV: 44 cm/s
Right super femoral prox sys PSV: -13 cm/s

## 2016-06-08 ENCOUNTER — Ambulatory Visit: Payer: BLUE CROSS/BLUE SHIELD | Admitting: Physical Therapy

## 2016-06-08 ENCOUNTER — Encounter: Payer: Self-pay | Admitting: Physical Therapy

## 2016-06-08 DIAGNOSIS — G546 Phantom limb syndrome with pain: Secondary | ICD-10-CM

## 2016-06-08 DIAGNOSIS — R29898 Other symptoms and signs involving the musculoskeletal system: Secondary | ICD-10-CM

## 2016-06-08 DIAGNOSIS — R2689 Other abnormalities of gait and mobility: Secondary | ICD-10-CM | POA: Diagnosis not present

## 2016-06-08 DIAGNOSIS — R2681 Unsteadiness on feet: Secondary | ICD-10-CM

## 2016-06-08 DIAGNOSIS — R296 Repeated falls: Secondary | ICD-10-CM

## 2016-06-08 NOTE — Therapy (Signed)
[facility] [address] [place], [place], [address] Phone: [phone]   Fax:  [phone]  Physical Therapy Treatment  Patient Details  Name: [name] MRN: [id number] Date of Birth: 09/17/1950 Referring Provider: Meridee Score, MD  Encounter Date: 06/08/2016      PT End of Session - 06/08/16 1414    Visit Number 6   Number of Visits 9   Date for PT Re-Evaluation 07/10/16   Authorization Type [employer]   Authorization - Visit Number 9   Authorization - Number of Visits 30   PT Start Time 0932   PT Stop Time 1018   PT Time Calculation (min) 46 min   Equipment Utilized During Treatment Gait belt   Activity Tolerance Patient tolerated treatment well   Behavior During Therapy WFL for tasks assessed/performed      Past Medical History:  Diagnosis Date  . Cigarette smoker   . Coronary artery disease   . Diabetes mellitus    type 1  x 50 yrs  . Diabetic retinopathy ([place])   . Dyslipidemia   . Peripheral vascular disease [facility])     Past Surgical History:  Procedure Laterality Date  . ABDOMINAL ANGIOGRAM N/A 05/29/2014   Procedure: ABDOMINAL ANGIOGRAM;  Surgeon: Laverda Page, MD;  Location: [facility] CATH LAB;  Service: Cardiovascular;  Laterality: N/A;  . AMPUTATION Left 10/25/2015   Procedure: Left Foot 5th Ray Amputation;  Surgeon: Newt Minion, MD;  Location: [place];  Service: Orthopedics;  Laterality: Left;  . AMPUTATION Left 12/06/2015   Procedure: AMPUTATION BELOW KNEE;  Surgeon: Newt Minion, MD;  Location: [place];  Service: Orthopedics;  Laterality: Left;  . CARDIAC CATHETERIZATION     EF is 55-60% and no wall motion abnormalities (long long time ago)  . FEMORAL-POPLITEAL BYPASS GRAFT Left 10/24/2015   Procedure: BYPASS GRAFT FEMORAL below knee POPLITEAL ARTERY with Left Saphenous Vein;  Surgeon: Serafina Mitchell, MD;  Location: [place];  Service: Vascular;  Laterality: Left;  . FRACTURE SURGERY     left arm  "many yrs ago"  . LOWER EXTREMITY ANGIOGRAM N/A 01/23/2014   Procedure: LOWER EXTREMITY ANGIOGRAM;  Surgeon: Laverda Page, MD;  Location: [facility] CATH LAB;  Service: Cardiovascular;  Laterality: N/A;  . LOWER EXTREMITY ANGIOGRAM N/A 07/31/2014   Procedure: LOWER EXTREMITY ANGIOGRAM;  Surgeon: Laverda Page, MD;  Location: [facility] CATH LAB;  Service: Cardiovascular;  Laterality: N/A;  . LOWER EXTREMITY ANGIOGRAM Left 10/11/2015   Procedure: Lower Extremity Angiogram;  Surgeon: Serafina Mitchell, MD;  Location: [place] CV LAB;  Service: Cardiovascular;  Laterality: Left;  . PERIPHERAL VASCULAR CATHETERIZATION Left 10/11/2015   Procedure: Peripheral Vascular Balloon Angioplasty;  Surgeon: Serafina Mitchell, MD;  Location: [place] CV LAB;  Service: Cardiovascular;  Laterality: Left;  sfa failed unable to cross occluded sfa  . PERIPHERAL VASCULAR CATHETERIZATION N/A 10/11/2015   Procedure: Abdominal Aortogram;  Surgeon: Serafina Mitchell, MD;  Location: [place] CV LAB;  Service: Cardiovascular;  Laterality: N/A;  . STUMP REVISION Left 03/02/2016   Procedure: Revision Left Below Knee Amputation;  Surgeon: Newt Minion, MD;  Location: [place];  Service: Orthopedics;  Laterality: Left;    There were no vitals filed for this visit.      Subjective Assessment - 06/08/16 0937    Subjective Goes to [facility] for new socket next Monday 10/23.   Currently in Pain? No/denies  [facility] Adult PT Treatment/Exercise - 06/08/16 0001      Prosthetics   Prosthetic Care Comments  Pt reports soreness in residual limb after working all day.   Current prosthetic wear tolerance (days/week)  daily   Current prosthetic wear tolerance (#hours/day)  wearing all awake hours   Residual limb condition  no wounds or skin issues noted.    Education Provided Skin check   Person(s) Educated Patient   Education Method Explanation   Education Method Verbalized understanding              Balance Exercises - 06/08/16 1420      Balance Exercises: Standing   Standing Eyes Opened Narrow base of support (BOS);Head turns;Foam/compliant surface  + multi level reaching   Standing Eyes Closed Narrow base of support (BOS);Foam/compliant surface;10 secs;3 reps   Tandem Stance Eyes open;Intermittent upper extremity support;3 reps   Tandem Gait Upper extremity support;Forward;4 reps   Partial Tandem Stance Eyes open  Alt Stepping and holding for 3 sec up/down hill on ramp, min   Other Standing Exercises braiding, level surface;           PT Education - 06/08/16 1413    Education provided Yes   Education Details discussed scheduling.   Person(s) Educated Patient   Methods Explanation   Comprehension Verbalized understanding          PT Short Term Goals - 06/01/16 1015      PT SHORT TERM GOAL #1   Title Patient verbalizes & demonstrates proper donning & adjusting wear /weight bearing to manage pain in residual limb. (Target Date: 06/04/2016)   Baseline MET 06/01/2016   Time 4   Period Weeks   Status Achieved     PT SHORT TERM GOAL #2   Title Pt will able to ambualte 500' on paved surfaces with prosthesis with supervision (Target Date: 06/04/2016   Baseline MET 06/01/2016   Time 4   Period Weeks   Status Achieved     PT SHORT TERM GOAL #3   Title Pt will be able to negoitate ramps, curbs, grass, slopes & stairs (1 rail reciprocally) with supervision with prosthesis. (Target Date: 06/04/2016   Baseline MET 06/01/2016   Time 4   Period Weeks   Status Achieved     PT SHORT TERM GOAL #4   Title Patient able to alternate step without UE support >10 steps without balance loss. (Target Date: 06/04/2016   Baseline MET 06/01/2016   Time 4   Period Weeks   Status Achieved     PT SHORT TERM GOAL #5   Title Patient reports pain increases <2 increments with standing & gait activities. (Target Date: 06/04/2016   Baseline MET 06/01/2016   Time 4   Period Weeks    Status Achieved           PT Long Term Goals - 05/11/16 1306      PT LONG TERM GOAL #1   Title Pt will wear prosthesis >90 % of all awake hours with no new wounds to indicate increased fucntional use with prosthesis (Target Date: 07/03/2016   Time 8   Period Weeks   Status On-going     PT LONG TERM GOAL #2   Title Pt will be indepedent in prosthetic and residual limb care to enable increased use of prosthesis to improve activity level (Target Date: 07/03/2016   Time 8   Period Weeks   Status On-going     PT LONG TERM GOAL #  3   Title Pt will be able to ambulate >1500' independent with prosthesis only including ramps, curbs, stairs (no rails step-to & 1 rail reciprocal), grass, pavement and level surfaces to indicate 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 (Target Date: 07/03/2016   Time 8   Period Weeks   Status On-going     PT LONG TERM GOAL #4   Title Patient able to perform floor transfer, lift 30#, carry 15#, push, pull and climb A-frame ladder with prosthesis only independently. (Target Date: 07/03/2016   Time 8   Period Weeks   Status On-going     PT LONG TERM GOAL #5   Title Pt will be improve Fuctional Gait Assessment will improve from 13/30 to >/= 24/30 to indicate decreased risk for falls (Target Date: 07/03/2016   Time 8   Period Weeks   Status On-going     PT LONG TERM GOAL #6   Title Pt will increase Berg Balance Score from 49/56 to >/= 52/56 to indicate a decreased risk for falls (Target Date: 07/03/2016   Time 8   Period Weeks   Status On-going     PT LONG TERM GOAL #7   Title Patient reports pain increases </= 2 increments with standing & gait activities >30 minutes. (Target Date: 07/03/2016   Time 8   Period Weeks   Status On-going               Plan - 06/08/16 1416    Clinical Impression Statement Pt reports soreness at left, anterior tib and phantom pain up to 5/10 with higher level standing balance actvitiies (tandem  stance, braiding); Pt requires intermittent UE support for balance activities with narrow BOS on level surface.     Rehab Potential Good   Clinical Impairments Affecting Rehab Potential 4 wounds on residual limb with Type 1 Diabetes > 50 years   PT Frequency 1x / week   PT Duration 8 weeks   PT Treatment/Interventions ADLs/Self Care Home Management;Therapeutic exercise;Manual techniques;Therapeutic activities;Functional mobility training;Stair training;Gait training;DME Instruction;Balance training;Neuromuscular re-education;Patient/family education;Prosthetic Training;Scar mobilization   PT Next Visit Plan check LTGs and possibly d/c next visit.      Patient will benefit from skilled therapeutic intervention in order to improve the following deficits and impairments:  Abnormal gait, Decreased activity tolerance, Decreased balance, Decreased mobility, Postural dysfunction, Decreased knowledge of use of DME, Decreased knowledge of precautions, Decreased endurance, Decreased safety awareness, Prosthetic Dependency, Pain, Improper body mechanics  Visit Diagnosis: Other abnormalities of gait and mobility  Unsteadiness on feet  Other symptoms and signs involving the musculoskeletal system  Phantom limb syndrome with pain ([place])  Repeated falls     Problem List Patient Active Problem List   Diagnosis Date Noted  . Below knee amputation status ([place]) 12/06/2015  . PAD (peripheral artery disease) ([place]) 10/24/2015  . Diabetes mellitus with peripheral artery disease ([place]) 07/30/2014  . Claudication in peripheral vascular disease ([place]) 07/30/2014  . IDDM (insulin dependent diabetes mellitus) ([place]) 01/23/2013  . CAD (coronary artery disease) 04/15/2011  . Hyperlipidemia 04/15/2011    Bjorn Loser, PTA  06/08/16, 2:29 PM [facility] [address] [place], [place], [address] Phone: [phone]   Fax:  [phone]  Name:  RENAN DANESE MRN: [id number] Date of Birth: Jul 31, 1951

## 2016-06-15 ENCOUNTER — Encounter: Payer: BLUE CROSS/BLUE SHIELD | Admitting: Physical Therapy

## 2016-06-19 ENCOUNTER — Encounter: Payer: Self-pay | Admitting: Surgery

## 2016-06-22 ENCOUNTER — Encounter: Payer: BLUE CROSS/BLUE SHIELD | Admitting: Rehabilitation

## 2016-06-22 ENCOUNTER — Ambulatory Visit (INDEPENDENT_AMBULATORY_CARE_PROVIDER_SITE_OTHER): Payer: BLUE CROSS/BLUE SHIELD | Admitting: Surgery

## 2016-06-22 ENCOUNTER — Encounter: Payer: Self-pay | Admitting: Surgery

## 2016-06-22 VITALS — BP 140/60 | HR 75 | Temp 97.1°F | Resp 18 | Ht 71.0 in | Wt 160.0 lb

## 2016-06-22 DIAGNOSIS — I70211 Atherosclerosis of native arteries of extremities with intermittent claudication, right leg: Secondary | ICD-10-CM

## 2016-06-22 NOTE — Progress Notes (Signed)
[facility]  Patient name: [name] MRN: [id number] DOB: [birth year] Sex: male  REASON FOR VISIT: follow up claudication  HPI: [name] is a 65 y.o. male who comes in today to discuss symptoms in his right leg.  He is well known to me having undergone a left femoral to below-knee popliteal artery bypass graft with saphenous vein on 10/24/2015.  This was done for gangrene.  Unfortunately, the patient was unable to heal the wounds on his leg and underwent transtibial amputation by Dr. Lajoyce Cornersuda  The patient is ambulating with a prosthesis.  He is complaining of some soreness in his right leg.  He does not have any ulcers or rest pain.  He has a history of angioplasty of an occluded right superficial femoral artery in the past by Dr. Jacinto HalimGanji.  Past Medical History:  Diagnosis Date  . Cigarette smoker   . Coronary artery disease   . Diabetes mellitus    type 1  x 50 yrs  . Diabetic retinopathy ([facility])   . Dyslipidemia   . Peripheral vascular disease ([facility])     Family History  Problem Relation Age of Onset  . Coronary artery disease Mother     SOCIAL HISTORY: Social History  Substance Use Topics  . Smoking status: Current Every Day Smoker    Packs/day: 0.75    Years: 30.00    Types: Cigarettes  . Smokeless tobacco: Never Used  . Alcohol use Yes     Comment: on occasion wine    Allergies  Allergen Reactions  . Simvastatin Other (See Comments)    MYALGIAS, MUSCLE WEAKNESS     Current Outpatient Prescriptions  Medication Sig Dispense Refill  . acetaminophen (TYLENOL) 500 MG tablet Take 1,000 mg by mouth 2 (two) times daily as needed (pain).     Marland Kitchen. aspirin EC 81 MG tablet Take 243 mg by mouth daily.     . baclofen (LIORESAL) 20 MG tablet Take 20 mg by mouth daily as needed (for leg cramps). Reported on 12/05/2015  0  . calcium carbonate (TUMS - DOSED IN MG ELEMENTAL CALCIUM) 500 MG chewable tablet Chew 2  tablets by mouth every 6 (six) hours as needed for indigestion or heartburn.    . cilostazol (PLETAL) 100 MG tablet Take 1 tablet (100 mg total) by mouth 2 (two) times daily. (Patient taking differently: Take 100 mg by mouth daily. ) 60 tablet 6  . doxycycline (VIBRAMYCIN) 100 MG capsule Take 100 mg by mouth 2 (two) times daily.    . folic acid (FOLVITE) 800 MCG tablet Take 1,600 mcg by mouth daily.     . metoprolol succinate (TOPROL-XL) 25 MG 24 hr tablet Take 1 tablet (25 mg total) by mouth daily. 30 tablet 1  . Multiple Vitamins-Minerals (MULTIVITAMIN GUMMIES ADULT PO) Take 2 tablets by mouth daily.     Marland Kitchen. NOVOLOG 100 UNIT/ML injection Inject into the skin continuous. Via insulin pump  0  . pravastatin (PRAVACHOL) 40 MG tablet Take 40 mg by mouth at bedtime.     . ramipril (ALTACE) 10 MG tablet Take 10 mg by mouth daily.       No current facility-administered medications for this visit.     REVIEW OF SYSTEMS:  [X]  denotes positive finding, [ ]  denotes negative finding Cardiac  Comments:  Chest pain or chest pressure:    Shortness of breath upon exertion:    Short of breath when lying flat:  Irregular heart rhythm:        Vascular    Pain in calf, thigh, or hip brought on by ambulation:    Pain in feet at night that wakes you up from your sleep:     Blood clot in your veins:    Leg swelling:         Pulmonary    Oxygen at home:    Productive cough:     Wheezing:         Neurologic    Sudden weakness in arms or legs:     Sudden numbness in arms or legs:     Sudden onset of difficulty speaking or slurred speech:    Temporary loss of vision in one eye:     Problems with dizziness:         Gastrointestinal    Blood in stool:     Vomited blood:         Genitourinary    Burning when urinating:     Blood in urine:        Psychiatric    Major depression:         Hematologic    Bleeding problems:    Problems with blood clotting too easily:        Skin    Rashes or  ulcers:        Constitutional    Fever or chills:      PHYSICAL EXAM: Vitals:   06/22/16 1011  BP: 140/60  Pulse: 75  Resp: 18  Temp: 97.1 F (36.2 C)  TempSrc: Oral  SpO2: 100%  Weight: 160 lb (72.6 kg)  Height: 5\' 11"  (1.803 m)    GENERAL: The patient is a well-nourished male, in no acute distress. The vital signs are documented above. CARDIAC: There is a regular rate and rhythm.  PULMONARY: There is good air exchange bilaterally without wheezing or rales. MUSCULOSKELETAL: There are no major deformities or cyanosis. NEUROLOGIC: No focal weakness or paresthesias are detected. SKIN: There are no ulcers or rashes noted. PSYCHIATRIC: The patient has a normal affect.  DATA:  I have reviewed the patient's ultrasound which shows occluded superficial femoral artery.  ABIs are calculated secondary to noncompressible vessels.  He has monophasic waveforms  MEDICAL ISSUES: Right leg claudication: I discussed with the patient that if his symptoms are tolerable, I would not recommend intervention at this time.  He continues to smoke.  His previously recanalization of an occluded superficial femoral artery has lasted less than 2 years.  He states that his symptoms are tolerable at this time.  He will contact me if he develops ulcers or worsening symptoms.  The next step would be angiography.  I do not believe that he had stents placed at the time of his recanalization and therefore he may be a candidate for repeat intervention versus surgical bypass.    Durene CalWells Mikaeel Petrow, MD [facility] Tel [phone] Pager [phone]

## 2016-06-29 ENCOUNTER — Ambulatory Visit: Payer: BLUE CROSS/BLUE SHIELD | Attending: Family | Admitting: Physical Therapy

## 2016-06-29 ENCOUNTER — Encounter: Payer: Self-pay | Admitting: Physical Therapy

## 2016-06-29 DIAGNOSIS — R2681 Unsteadiness on feet: Secondary | ICD-10-CM | POA: Diagnosis present

## 2016-06-29 DIAGNOSIS — R29898 Other symptoms and signs involving the musculoskeletal system: Secondary | ICD-10-CM | POA: Diagnosis present

## 2016-06-29 DIAGNOSIS — R2689 Other abnormalities of gait and mobility: Secondary | ICD-10-CM | POA: Insufficient documentation

## 2016-06-30 NOTE — Therapy (Signed)
[facility] [address] [place], [place], [address] Phone: [phone]   Fax:  [phone]  Physical Therapy Treatment  Patient Details  Name: [name] MRN: [id number] Date of Birth: 06-25-1951 Referring Provider: Meridee Score, MD  Encounter Date: 06/29/2016      PT End of Session - 06/29/16 1153    Visit Number 7   Number of Visits 9   Date for PT Re-Evaluation 07/10/16   Authorization Type BCBS   Authorization - Visit Number 10   Authorization - Number of Visits 30   PT Start Time 0933   PT Stop Time 1018   PT Time Calculation (min) 45 min   Equipment Utilized During Treatment Gait belt   Activity Tolerance Patient tolerated treatment well   Behavior During Therapy [facility] for tasks assessed/performed      Past Medical History:  Diagnosis Date  . Cigarette smoker   . Coronary artery disease   . Diabetes mellitus    type 1  x 50 yrs  . Diabetic retinopathy ([place])   . Dyslipidemia   . Peripheral vascular disease [facility])     Past Surgical History:  Procedure Laterality Date  . ABDOMINAL ANGIOGRAM N/A 05/29/2014   Procedure: ABDOMINAL ANGIOGRAM;  Surgeon: Laverda Page, MD;  Location: [facility] CATH LAB;  Service: Cardiovascular;  Laterality: N/A;  . AMPUTATION Left 10/25/2015   Procedure: Left Foot 5th Ray Amputation;  Surgeon: Newt Minion, MD;  Location: [place];  Service: Orthopedics;  Laterality: Left;  . AMPUTATION Left 12/06/2015   Procedure: AMPUTATION BELOW KNEE;  Surgeon: Newt Minion, MD;  Location: [place];  Service: Orthopedics;  Laterality: Left;  . CARDIAC CATHETERIZATION     EF is 55-60% and no wall motion abnormalities (long long time ago)  . FEMORAL-POPLITEAL BYPASS GRAFT Left 10/24/2015   Procedure: BYPASS GRAFT FEMORAL below knee POPLITEAL ARTERY with Left Saphenous Vein;  Surgeon: Serafina Mitchell, MD;  Location: [place];  Service: Vascular;  Laterality: Left;  . FRACTURE SURGERY     left arm  "many yrs ago"  . LOWER EXTREMITY ANGIOGRAM N/A 01/23/2014   Procedure: LOWER EXTREMITY ANGIOGRAM;  Surgeon: Laverda Page, MD;  Location: [facility] CATH LAB;  Service: Cardiovascular;  Laterality: N/A;  . LOWER EXTREMITY ANGIOGRAM N/A 07/31/2014   Procedure: LOWER EXTREMITY ANGIOGRAM;  Surgeon: Laverda Page, MD;  Location: [facility] CATH LAB;  Service: Cardiovascular;  Laterality: N/A;  . LOWER EXTREMITY ANGIOGRAM Left 10/11/2015   Procedure: Lower Extremity Angiogram;  Surgeon: Serafina Mitchell, MD;  Location: [place] CV LAB;  Service: Cardiovascular;  Laterality: Left;  . PERIPHERAL VASCULAR CATHETERIZATION Left 10/11/2015   Procedure: Peripheral Vascular Balloon Angioplasty;  Surgeon: Serafina Mitchell, MD;  Location:  CV LAB;  Service: Cardiovascular;  Laterality: Left;  sfa failed unable to cross occluded sfa  . PERIPHERAL VASCULAR CATHETERIZATION N/A 10/11/2015   Procedure: Abdominal Aortogram;  Surgeon: Serafina Mitchell, MD;  Location: [place] CV LAB;  Service: Cardiovascular;  Laterality: N/A;  . STUMP REVISION Left 03/02/2016   Procedure: Revision Left Below Knee Amputation;  Surgeon: Newt Minion, MD;  Location: [place];  Service: Orthopedics;  Laterality: Left;    There were no vitals filed for this visit.      Subjective Assessment - 06/29/16 0935    Subjective He is getting new prosthesis this week he thinks. No falls. He struggling with uneven terrain.     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 No/denies      Prosthetic Training with Transtibial Amputation prosthesis: No skin breakdown but tenderness & edema at distal tibia. PT instructed pt in sock ply management & vacuum seal on new socket that will aid fit.  PT & pt discussed water leg vs. Stomper Jr. On current socket for wet or water activities.   Corner balance activities on foam with eyes open and on floor with eyes closed head turns 4 directions. Pt return demo for HEP. Gait in hall for visual reference with head turns maintaining path & pace. Stairs with 2 rails to 1 rail reciprocal with verbal cues on weight shift & knee control. Instruction how improving this activity improves motor control & strength to raise & lower weight.  Pt ambulated 1000' outdoors with demo, verbal cues on knee control with 2" height difference in/out gravel pit and grass slope. Standing rest with increased weight shift to prosthesis to unload "sound leg" and ambulating only to circulation pain 7/10 before seated rest.                              PT Short Term Goals - 06/01/16 1015      PT SHORT TERM GOAL #1   Title Patient verbalizes & demonstrates proper donning & adjusting wear /weight bearing to manage pain in residual limb. (Target Date: 06/04/2016)   Baseline MET 06/01/2016   Time 4   Period Weeks   Status Achieved     PT SHORT TERM GOAL #2   Title Pt will able to ambualte 500' on paved surfaces with prosthesis with supervision (Target Date: 06/04/2016   Baseline MET 06/01/2016   Time 4   Period Weeks   Status Achieved     PT SHORT TERM GOAL #3   Title Pt will be able to negoitate ramps, curbs, grass, slopes & stairs (1 rail reciprocally) with supervision with prosthesis. (Target Date: 06/04/2016   Baseline MET 06/01/2016   Time 4   Period Weeks   Status Achieved     PT SHORT TERM GOAL #4   Title Patient able to alternate step without UE support >10 steps without balance loss. (Target Date: 06/04/2016   Baseline MET 06/01/2016   Time 4   Period Weeks   Status Achieved     PT SHORT TERM GOAL #5   Title Patient reports pain increases <2 increments with standing & gait activities. (Target Date: 06/04/2016   Baseline MET 06/01/2016   Time 4   Period Weeks   Status Achieved           PT Long Term Goals -  05/11/16 1306      PT LONG TERM GOAL #1   Title Pt will wear prosthesis >90 % of all awake hours with no new wounds to indicate increased fucntional use with prosthesis (Target Date: 07/03/2016   Time 8   Period Weeks   Status On-going     PT LONG TERM GOAL #2   Title Pt will be indepedent in prosthetic and residual limb care to enable increased use of prosthesis to improve activity level (Target Date: 07/03/2016   Time 8   Period Weeks   Status On-going     PT LONG TERM GOAL #3   Title  Pt will be able to ambulate >1500' independent with prosthesis only including ramps, curbs, stairs (no rails step-to & 1 rail reciprocal), grass, pavement and level surfaces to indicate 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 (Target Date: 07/03/2016   Time 8   Period Weeks   Status On-going     PT LONG TERM GOAL #4   Title Patient able to perform floor transfer, lift 30#, carry 15#, push, pull and climb A-frame ladder with prosthesis only independently. (Target Date: 07/03/2016   Time 8   Period Weeks   Status On-going     PT LONG TERM GOAL #5   Title Pt will be improve Fuctional Gait Assessment will improve from 13/30 to >/= 24/30 to indicate decreased risk for falls (Target Date: 07/03/2016   Time 8   Period Weeks   Status On-going     PT LONG TERM GOAL #6   Title Pt will increase Berg Balance Score from 49/56 to >/= 52/56 to indicate a decreased risk for falls (Target Date: 07/03/2016   Time 8   Period Weeks   Status On-going     PT LONG TERM GOAL #7   Title Patient reports pain increases </= 2 increments with standing & gait activities >30 minutes. (Target Date: 07/03/2016   Time 8   Period Weeks   Status On-going               Plan - 06/29/16 1154    Clinical Impression Statement Patient improved knee control on slopes including grass with skilled instruction in technique. Patient needs skilled care with new suspension system so last 2 appointments on plan  of care are being held until he recieves new socket in 2 weeks.    Rehab Potential Good   Clinical Impairments Affecting Rehab Potential 4 wounds on residual limb with Type 1 Diabetes > 50 years   PT Frequency 1x / week   PT Duration 8 weeks   PT Treatment/Interventions ADLs/Self Care Home Management;Therapeutic exercise;Manual techniques;Therapeutic activities;Functional mobility training;Stair training;Gait training;DME Instruction;Balance training;Neuromuscular re-education;Patient/family education;Prosthetic Training;Scar mobilization   PT Next Visit Plan assess new socket fit and update prosthetic care      Patient will benefit from skilled therapeutic intervention in order to improve the following deficits and impairments:  Abnormal gait, Decreased activity tolerance, Decreased balance, Decreased mobility, Postural dysfunction, Decreased knowledge of use of DME, Decreased knowledge of precautions, Decreased endurance, Decreased safety awareness, Prosthetic Dependency, Pain, Improper body mechanics  Visit Diagnosis: Other abnormalities of gait and mobility  Unsteadiness on feet  Other symptoms and signs involving the musculoskeletal system     Problem List Patient Active Problem List   Diagnosis Date Noted  . Below knee amputation status ([place]) 12/06/2015  . PAD (peripheral artery disease) ([place]) 10/24/2015  . Diabetes mellitus with peripheral artery disease ([place]) 07/30/2014  . Claudication in peripheral vascular disease ([place]) 07/30/2014  . IDDM (insulin dependent diabetes mellitus) ([place]) 01/23/2013  . CAD (coronary artery disease) 04/15/2011  . Hyperlipidemia 04/15/2011    Kazuo Durnil PT, DPT 06/30/2016, 11:59 AM  [facility] [address] [place], [place], [address] Phone: [phone]   Fax:  [phone]  Name: [name] MRN: [id number] Date of Birth: [birth year]

## 2016-07-02 DIAGNOSIS — E103593 Type 1 diabetes mellitus with proliferative diabetic retinopathy without macular edema, bilateral: Secondary | ICD-10-CM | POA: Insufficient documentation

## 2016-07-02 DIAGNOSIS — Z961 Presence of intraocular lens: Secondary | ICD-10-CM | POA: Insufficient documentation

## 2016-07-06 ENCOUNTER — Encounter: Payer: BLUE CROSS/BLUE SHIELD | Admitting: Physical Therapy

## 2016-07-13 ENCOUNTER — Encounter: Payer: BLUE CROSS/BLUE SHIELD | Admitting: Physical Therapy

## 2016-07-14 NOTE — Addendum Note (Signed)
Addended by: Burton ApleyPETTY, Braxtyn Bojarski A on: 07/14/2016 03:25 PM   Modules accepted: Orders

## 2016-07-15 ENCOUNTER — Ambulatory Visit: Payer: BLUE CROSS/BLUE SHIELD | Admitting: Physical Therapy

## 2016-07-15 ENCOUNTER — Encounter: Payer: Self-pay | Admitting: Physical Therapy

## 2016-07-15 DIAGNOSIS — R2689 Other abnormalities of gait and mobility: Secondary | ICD-10-CM

## 2016-07-15 DIAGNOSIS — R29898 Other symptoms and signs involving the musculoskeletal system: Secondary | ICD-10-CM

## 2016-07-15 DIAGNOSIS — R2681 Unsteadiness on feet: Secondary | ICD-10-CM

## 2016-07-20 NOTE — Therapy (Signed)
[facility] [address] [place], [place], [address] Phone: [phone]   Fax:  [phone]  Physical Therapy Treatment  Patient Details  Name: LADARRIOUS KIRKSEY MRN: [id number] Date of Birth: [birth year] Referring Provider: Meridee Score, MD  Encounter Date: 07/15/2016     07/15/16 1500  PT Visits / Re-Eval  Visit Number 8  Number of Visits 9  Date for PT Re-Evaluation 07/10/16  Authorization  Authorization Type BCBS  Authorization - Visit Number 10  Authorization - Number of Visits 30  PT Time Calculation  PT Start Time 1310  PT Stop Time 1400  PT Time Calculation (min) 50 min  PT - End of Session  Equipment Utilized During Treatment Gait belt  Activity Tolerance Patient tolerated treatment well  Behavior During Therapy [facility] for tasks assessed/performed    Past Medical History:  Diagnosis Date  . Cigarette smoker   . Coronary artery disease   . Diabetes mellitus    type 1  x 50 yrs  . Diabetic retinopathy ([place])   . Dyslipidemia   . Peripheral vascular disease [facility])     Past Surgical History:  Procedure Laterality Date  . ABDOMINAL ANGIOGRAM N/A 05/29/2014   Procedure: ABDOMINAL ANGIOGRAM;  Surgeon: Laverda Page, MD;  Location: [facility] CATH LAB;  Service: Cardiovascular;  Laterality: N/A;  . AMPUTATION Left 10/25/2015   Procedure: Left Foot 5th Ray Amputation;  Surgeon: Newt Minion, MD;  Location: [place];  Service: Orthopedics;  Laterality: Left;  . AMPUTATION Left 12/06/2015   Procedure: AMPUTATION BELOW KNEE;  Surgeon: Newt Minion, MD;  Location: [place];  Service: Orthopedics;  Laterality: Left;  . CARDIAC CATHETERIZATION     EF is 55-60% and no wall motion abnormalities (long long time ago)  . FEMORAL-POPLITEAL BYPASS GRAFT Left 10/24/2015   Procedure: BYPASS GRAFT FEMORAL below knee POPLITEAL ARTERY with Left Saphenous Vein;  Surgeon: Serafina Mitchell, MD;  Location: [place];  Service: Vascular;  Laterality:  Left;  . FRACTURE SURGERY     left arm "many yrs ago"  . LOWER EXTREMITY ANGIOGRAM N/A 01/23/2014   Procedure: LOWER EXTREMITY ANGIOGRAM;  Surgeon: Laverda Page, MD;  Location: [facility] CATH LAB;  Service: Cardiovascular;  Laterality: N/A;  . LOWER EXTREMITY ANGIOGRAM N/A 07/31/2014   Procedure: LOWER EXTREMITY ANGIOGRAM;  Surgeon: Laverda Page, MD;  Location: [facility] CATH LAB;  Service: Cardiovascular;  Laterality: N/A;  . LOWER EXTREMITY ANGIOGRAM Left 10/11/2015   Procedure: Lower Extremity Angiogram;  Surgeon: Serafina Mitchell, MD;  Location: [place] CV LAB;  Service: Cardiovascular;  Laterality: Left;  . PERIPHERAL VASCULAR CATHETERIZATION Left 10/11/2015   Procedure: Peripheral Vascular Balloon Angioplasty;  Surgeon: Serafina Mitchell, MD;  Location: [place] CV LAB;  Service: Cardiovascular;  Laterality: Left;  sfa failed unable to cross occluded sfa  . PERIPHERAL VASCULAR CATHETERIZATION N/A 10/11/2015   Procedure: Abdominal Aortogram;  Surgeon: Serafina Mitchell, MD;  Location: [place] CV LAB;  Service: Cardiovascular;  Laterality: N/A;  . STUMP REVISION Left 03/02/2016   Procedure: Revision Left Below Knee Amputation;  Surgeon: Newt Minion, MD;  Location: [place];  Service: Orthopedics;  Laterality: Left;    There were no vitals filed for this visit.     07/15/16 1317  Symptoms/Limitations  Subjective He got new prosthesis ~10 days ago. Wearing all awake hours without issues. No slippage.   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 No/denies      07/15/16 1315  Transfers  Transfers Sit to Stand;Stand to Sit;Floor to Transfer  Sit to Stand 6: Modified independent (Device/Increase time);Without upper extremity assist;From chair/3-in-1  Stand to Sit 6: Modified independent (Device/Increase time);Without  upper extremity assist;To chair/3-in-1  Floor to Transfer 6: Modified independent (Device/Increase time);With upper extremity assist  Ambulation/Gait  Ambulation/Gait Yes  Ambulation/Gait Assistance 6: Modified independent (Device/Increase time)  Ambulation Distance (Feet) 1500 Feet  Assistive device Prosthesis;None  Gait Pattern WFL  Ambulation Surface Indoor;Level;Outdoor;Paved;Gravel;Grass  Gait velocity 4.03 ft/sec comfortable & 4.55 ft/sec fast  Stairs Yes  Stairs Assistance 6: Modified independent (Device/Increase time)  Stair Management Technique One rail Right;One rail Left;Alternating pattern;Forwards  Number of Stairs 4  Ramp 6: Modified independent (Device) (prosthesis only)  Curb 6: Modified independent (Device/increase time) (prosthesis only)  Berg Balance Test  Sit to Stand 4  Standing Unsupported 4  Sitting with Back Unsupported but Feet Supported on Floor or Stool 4  Stand to Sit 4  Transfers 4  Standing Unsupported with Eyes Closed 4  Standing Ubsupported with Feet Together 4  From Standing, Reach Forward with Outstretched Arm 4  From Standing Position, Pick up Object from Floor 4  From Standing Position, Turn to Look Behind Over each Shoulder 4  Turn 360 Degrees 4  Standing Unsupported, Alternately Place Feet on Step/Stool 4  Standing Unsupported, One Foot in Front 3  Standing on One Leg 1  Total Score 52  Functional Gait  Assessment  Gait assessed  Yes  Gait Level Surface 3  Change in Gait Speed 3  Gait with Horizontal Head Turns 3  Gait with Vertical Head Turns 3  Gait and Pivot Turn 3  Step Over Obstacle 3  Gait with Narrow Base of Support 0  Gait with Eyes Closed 2  Ambulating Backwards 3  Steps 2  Total Score 25     07/15/16 1315  Therapeutic Activites   Therapeutic Activities Lifting;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 Pt verbalized understanding of both.   Donning prosthesis  6 (after instruction with new system)  Doffing prosthesis  6  Current prosthetic wear tolerance (days/week)  daily  Current prosthetic wear tolerance (#hours/day)  wearing all awake hours  Current prosthetic weight-bearing tolerance (hours/day)  tolerated 30 minutes with no c/o limb discomfort  Edema none  Residual limb condition  no wounds or skin issues noted.   K code/activity level with prosthetic use  K4 full community variable cadence, high level activities                              PT Short Term Goals - 06/01/16 1015      PT SHORT TERM GOAL #1   Title Patient verbalizes & demonstrates proper donning & adjusting wear /weight bearing to manage pain in residual limb. (Target Date: 06/04/2016)   Baseline MET 06/01/2016  Time 4   Period Weeks   Status Achieved     PT SHORT TERM GOAL #2   Title Pt will able to ambualte 500' on paved surfaces with prosthesis with supervision (Target Date: 06/04/2016   Baseline MET 06/01/2016   Time 4   Period Weeks   Status Achieved     PT SHORT TERM GOAL #3   Title Pt will be able to negoitate ramps, curbs, grass, slopes & stairs (1 rail reciprocally) with supervision with prosthesis. (Target Date: 06/04/2016   Baseline MET 06/01/2016   Time 4   Period Weeks   Status Achieved     PT SHORT TERM GOAL #4   Title Patient able to alternate step without UE support >10 steps without balance loss. (Target Date: 06/04/2016   Baseline MET 06/01/2016   Time 4   Period  Weeks   Status Achieved     PT SHORT TERM GOAL #5   Title Patient reports pain increases <2 increments with standing & gait activities. (Target Date: 06/04/2016   Baseline MET 06/01/2016   Time 4   Period Weeks   Status Achieved           PT Long Term Goals - 07/15/16 1500      PT LONG TERM GOAL #1   Title Pt will wear prosthesis >90 % of all awake hours with no new wounds to indicate increased fucntional use with prosthesis (Target Date: 07/03/2016   Baseline MET 07/15/2016   Time 8   Period Weeks   Status Achieved     PT LONG TERM GOAL #2   Title Pt will be indepedent in prosthetic and residual limb care to enable increased use of prosthesis to improve activity level (Target Date: 07/03/2016   Baseline MET 07/15/2016   Time 8   Period Weeks   Status Achieved     PT LONG TERM GOAL #3   Title Pt will be able to ambulate >1500' independent with prosthesis only including ramps, curbs, stairs (no rails step-to & 1 rail reciprocal), grass, pavement and level surfaces to indicate 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 (Target Date: 07/03/2016   Baseline MET 07/15/2016   Time 8   Period Weeks   Status Achieved     PT LONG TERM GOAL #4   Title Patient able to perform floor transfer, lift 30#, carry 15#, push, pull and climb A-frame ladder with prosthesis only independently. (Target Date: 07/03/2016   Baseline MET 07/15/2016   Time 8   Period Weeks   Status Achieved     PT LONG TERM GOAL #5   Title Pt will be improve Fuctional Gait Assessment will improve from 13/30 to >/= 24/30 to indicate decreased risk for falls (Target Date: 07/03/2016   Baseline MET 07/15/2016   Time 8   Period Weeks   Status Achieved     PT LONG TERM GOAL #6   Title Pt will increase Berg Balance Score from 49/56 to >/= 52/56 to indicate a decreased risk for falls (Target Date: 07/03/2016   Baseline MET 07/15/2016   Time 8   Period Weeks   Status Achieved     PT LONG TERM GOAL  #7   Title Patient reports pain increases </= 2 increments with standing & gait activities >30 minutes. 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 He is wearing prosthesis all awake hours without issues.   Pt will benefit from skilled therapeutic intervention in order to improve on the following deficits Abnormal gait;Decreased activity tolerance;Decreased balance;Decreased mobility;Postural dysfunction;Decreased knowledge of use of DME;Decreased knowledge of precautions;Decreased endurance;Decreased safety awareness;Prosthetic Dependency;Pain;Improper body mechanics  Rehab Potential Good  Clinical Impairments Affecting Rehab Potential 4 wounds on residual limb with Type 1 Diabetes > 50 years  PT Frequency 1x / week  PT Duration 8 weeks  PT Treatment/Interventions ADLs/Self Care Home Management;Therapeutic exercise;Manual techniques;Therapeutic activities;Functional mobility training;Stair training;Gait training;DME Instruction;Balance training;Neuromuscular re-education;Patient/family education;Prosthetic Training;Scar mobilization  PT Next Visit Plan discharge         Patient will benefit from skilled therapeutic intervention in order to improve the following deficits and impairments:  Abnormal gait, Decreased activity tolerance, Decreased balance, Decreased mobility, Postural dysfunction, Decreased knowledge of use of DME, Decreased knowledge of precautions, Decreased endurance, Decreased safety awareness, Prosthetic Dependency, Pain, Improper body mechanics  Visit Diagnosis: Other abnormalities of gait and mobility  Unsteadiness on feet  Other symptoms and signs involving the musculoskeletal system     Problem List Patient Active Problem List   Diagnosis  Date Noted  . Below knee amputation status ([place]) 12/06/2015  . PAD (peripheral artery disease) ([name]) 10/24/2015  . Diabetes mellitus with peripheral artery disease ([place]) 07/30/2014  . Claudication in peripheral vascular disease ([place]) 07/30/2014  . IDDM (insulin dependent diabetes mellitus) ([place]) 01/23/2013  . CAD (coronary artery disease) 04/15/2011  . Hyperlipidemia 04/15/2011   PHYSICAL THERAPY DISCHARGE SUMMARY  Visits from Start of Care: 8  Current functional level related to goals / functional outcomes: See above   Remaining deficits: See above   Education / Equipment: Prosthetic care & use including work tasks  Plan: Patient agrees to discharge.  Patient goals were met. Patient is being discharged due to meeting the stated rehab goals.  ?????          Ashton Sabine PT, DPT 07/20/2016, 6:49 AM  [facility] [address] [place], [place], [address] Phone: [phone]   Fax:  [phone]  Name: OSIRIS ODRISCOLL MRN: [id number] Date of Birth: Jan 20, 1951

## 2016-07-22 ENCOUNTER — Encounter: Payer: Self-pay | Admitting: Physical Therapy

## 2016-07-27 ENCOUNTER — Ambulatory Visit (INDEPENDENT_AMBULATORY_CARE_PROVIDER_SITE_OTHER): Payer: BLUE CROSS/BLUE SHIELD | Admitting: Family

## 2016-07-27 ENCOUNTER — Encounter (INDEPENDENT_AMBULATORY_CARE_PROVIDER_SITE_OTHER): Payer: Self-pay | Admitting: Orthopedic Surgery

## 2016-07-27 VITALS — Ht 71.0 in | Wt 160.0 lb

## 2016-07-27 DIAGNOSIS — Z89512 Acquired absence of left leg below knee: Secondary | ICD-10-CM | POA: Diagnosis not present

## 2016-07-27 NOTE — Progress Notes (Signed)
Office Visit Note   Patient: [name]           Date of Birth: 1951/07/01           MRN: [id number] Visit Date: 07/27/2016              Requested by: [name] CornJohn Russo, MD [address] [place], [place] [address] PCP: [name] PoundsUSSO,[name] M, MD   Assessment & Plan: Visit Diagnoses:  1. 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   Follow-Up Instructions: Return if symptoms worsen or fail to improve.   Orders:  No orders of the defined types were placed in this encounter.  No orders of the defined types were placed in this encounter.     Procedures: No procedures performed   Clinical Data: No additional findings.   Subjective: Chief Complaint  Patient presents with  . Left Knee - Follow-up    Revision left below knee amputation 03/02/16    Patient presents for 3 month return office visit of left below amputation 03/02/16. Patient is 5 months post op. He states he has good days and bad days. He ambulates with prosthetic today. He states last week the pain was so bad he could not walk. He used some biofreeze, took advil and gabapentin and was fine the following day. He feels this is the best fitting prosthetic he's had comparatively.     Review of Systems  Constitutional: Negative for chills and fever.     Objective: Vital Signs: Ht 5\' 11"  (1.803 m)   Wt 160 lb (72.6 kg)   BMI 22.32 kg/m   Physical Exam  Constitutional: He is oriented to person, place, and time. He appears well-developed and well-nourished.  Pulmonary/Chest: Effort normal.  Musculoskeletal:  Left residual limb well healed. Well consolidated. No open areas, no drainage, no erythema, no sign of infection.   Neurological: He is alert and oriented to person, place, and time.  Psychiatric: He has a normal mood and affect.  Nursing note reviewed.   Ortho Exam  Specialty Comments:  No specialty comments  available.  Imaging: No results found.   PMFS History: Patient Active Problem List   Diagnosis Date Noted  . Below knee amputation status (HCC) 12/06/2015  . PAD (peripheral artery disease) (HCC) 10/24/2015  . Diabetes mellitus with peripheral artery disease (HCC) 07/30/2014  . Claudication in peripheral vascular disease (HCC) 07/30/2014  . IDDM (insulin dependent diabetes mellitus) (HCC) 01/23/2013  . CAD (coronary artery disease) 04/15/2011  . Hyperlipidemia 04/15/2011   Past Medical History:  Diagnosis Date  . Cigarette smoker   . Coronary artery disease   . Diabetes mellitus    type 1  x 50 yrs  . Diabetic retinopathy (HCC)   . Dyslipidemia   . Peripheral vascular disease (HCC)     Family History  Problem Relation Age of Onset  . Coronary artery disease Mother     Past Surgical History:  Procedure Laterality Date  . ABDOMINAL ANGIOGRAM N/A 05/29/2014   Procedure: ABDOMINAL ANGIOGRAM;  Surgeon: Pamella PertJagadeesh R Ganji, MD;  [facility];  Service: Cardiovascular;  Laterality: N/A;  . AMPUTATION Left 10/25/2015   Procedure: Left Foot 5th Ray Amputation;  Surgeon: Nadara MustardMarcus Duda V, MD;  [facility];  Service: Orthopedics;  Laterality: Left;  . AMPUTATION Left 12/06/2015   Procedure: AMPUTATION BELOW KNEE;  Surgeon: Nadara MustardMarcus Duda V, MD;  [facility]: [facility] OR;  Service: Orthopedics;  Laterality: Left;  . CARDIAC CATHETERIZATION     EF is 55-60% and no wall motion abnormalities (long long time ago)  . FEMORAL-POPLITEAL BYPASS GRAFT Left 10/24/2015   Procedure: BYPASS GRAFT FEMORAL below knee POPLITEAL ARTERY with Left Saphenous Vein;  Surgeon: Nada LibmanVance W Brabham, MD;  [facility];  Service: Vascular;  Laterality: Left;  . FRACTURE SURGERY     left arm "many yrs ago"  . LOWER EXTREMITY ANGIOGRAM N/A 01/23/2014   Procedure: LOWER EXTREMITY ANGIOGRAM;  Surgeon: Pamella PertJagadeesh R Ganji, MD;  [facility];  Service: Cardiovascular;  Laterality: N/A;  . LOWER EXTREMITY ANGIOGRAM N/A  07/31/2014   Procedure: LOWER EXTREMITY ANGIOGRAM;  Surgeon: Pamella PertJagadeesh R Ganji, MD;  [facility];  Service: Cardiovascular;  Laterality: N/A;  . LOWER EXTREMITY ANGIOGRAM Left 10/11/2015   Procedure: Lower Extremity Angiogram;  Surgeon: Nada LibmanVance W Brabham, MD;  [facility] INVASIVE CV LAB;  Service: Cardiovascular;  Laterality: Left;  . PERIPHERAL VASCULAR CATHETERIZATION Left 10/11/2015   Procedure: Peripheral Vascular Balloon Angioplasty;  Surgeon: Nada LibmanVance W Brabham, MD;  [facility] INV[facility] CV LAB;  Service: Cardiovascular;  Laterality: Left;  sfa failed unable to cross occluded sfa  . PERIPHERAL VASCULAR CATHETERIZATION N/A 10/11/2015   Procedure: Abdominal Aortogram;  Surgeon: Nada LibmanVance W Brabham, MD;  [facility] INV[facility] CV LAB;  Service: Cardiovascular;  Laterality: N/A;  . STUMP REVISION Left 03/02/2016   Procedure: Revision Left Below Knee Amputation;  Surgeon: Nadara MustardMarcus V Duda, MD;  Location: [facility] OR;  Service: Orthopedics;  Laterality: Left;   Social History   Occupational History  . Not on file.   Social History Main Topics  . Smoking status: Current Every Day Smoker    Packs/day: 0.75    Years: 30.00    Types: Cigarettes  . Smokeless tobacco: Never Used  . Alcohol use Yes     Comment: on occasion wine  . Drug use: No  . Sexual activity: Not on file

## 2016-12-11 ENCOUNTER — Telehealth (INDEPENDENT_AMBULATORY_CARE_PROVIDER_SITE_OTHER): Payer: Self-pay | Admitting: Orthopedic Surgery

## 2016-12-14 NOTE — Telephone Encounter (Signed)
ERROR

## 2017-01-14 ENCOUNTER — Ambulatory Visit: Payer: BLUE CROSS/BLUE SHIELD | Admitting: Podiatry

## 2017-01-29 ENCOUNTER — Encounter: Payer: Self-pay | Admitting: Podiatry

## 2017-01-29 ENCOUNTER — Ambulatory Visit (INDEPENDENT_AMBULATORY_CARE_PROVIDER_SITE_OTHER): Payer: BLUE CROSS/BLUE SHIELD | Admitting: Podiatry

## 2017-01-29 DIAGNOSIS — M129 Arthropathy, unspecified: Secondary | ICD-10-CM

## 2017-01-29 DIAGNOSIS — B351 Tinea unguium: Secondary | ICD-10-CM | POA: Diagnosis not present

## 2017-01-29 DIAGNOSIS — E1142 Type 2 diabetes mellitus with diabetic polyneuropathy: Secondary | ICD-10-CM

## 2017-01-29 DIAGNOSIS — M79676 Pain in unspecified toe(s): Secondary | ICD-10-CM

## 2017-01-29 NOTE — Progress Notes (Signed)
   Subjective:    Patient ID: [name], male    DOB: 1950-09-12, 66 y.o.   MRN: [id number]  HPI this patient presents the office with chief complaint of a long thick painful toenails, especially his second and third toenails, right foot. He says they're painful walking and wearing his shoes. 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 Patient has a history of a below-knee knee leg amputation left extremity. Patient says he ran into [name] at [employer] and [name]  told him to make an appointment for an evaluation .  He presents the office today for debridement of his long nails, as well as an evaluation by [name] Nobleick.    Review of Systems  All other systems reviewed and are negative.      Objective:   Physical Exam GENERAL APPEARANCE: Alert, conversant. Appropriately groomed. No acute distress.  VASCULAR: Pedal pulses are not   [facility]   Capillary refill time is immediate to all digits,  Normal temperature gradient.   NEUROLOGIC: sensation is absent  to 5.07 monofilament at 5/5 sites   Light touch is absent  bilateral, Muscle strength normal right foot. MUSCULOSKELETAL: acceptable muscle strength, tone and stability bilateral.  Intrinsic muscluature intact bilateral.  Rectus appearance of foot and digits noted bilateral Arthritic changes Liz-[name] right foot.Marland Kitchen.  NAILS  Thick disfigured discolored nails right foot. DERMATOLOGIC: skin color, texture, and turgor are within normal limits.  No preulcerative lesions or ulcers  are seen, no interdigital maceration noted.  No open lesions present.  . No drainage noted.         Assessment & Plan:  Onychomycosis  Right foot.  Charcot foot right  Diabetes with angiopathy and neurology.  IE  Debridement of nails.  Patient was told that he needs to make a return appointment in 3 months and to make it on Tuesday, Wednesday or Thursday when [name]  and I are both in the office at the same time.   [name] DPM

## 2017-02-10 ENCOUNTER — Telehealth (INDEPENDENT_AMBULATORY_CARE_PROVIDER_SITE_OTHER): Payer: Self-pay | Admitting: *Deleted

## 2017-02-10 NOTE — Telephone Encounter (Signed)
Pt needs a RX for new liner for prosthetic.

## 2017-02-10 NOTE — Telephone Encounter (Signed)
I called and spoke with patient, he is still using [facility]. RX for liner faxed to [facility] at [phone]

## 2017-04-27 ENCOUNTER — Telehealth (INDEPENDENT_AMBULATORY_CARE_PROVIDER_SITE_OTHER): Payer: Self-pay | Admitting: Radiology

## 2017-04-27 NOTE — Telephone Encounter (Signed)
I tried calling, fax does not have visible juror date will try to call again tomorrow to find the date out so we can make later.

## 2017-04-28 ENCOUNTER — Telehealth (INDEPENDENT_AMBULATORY_CARE_PROVIDER_SITE_OTHER): Payer: Self-pay | Admitting: Orthopedic Surgery

## 2017-04-28 ENCOUNTER — Other Ambulatory Visit (INDEPENDENT_AMBULATORY_CARE_PROVIDER_SITE_OTHER): Payer: Self-pay | Admitting: Family

## 2017-04-28 NOTE — Telephone Encounter (Signed)
Will fax this afternoon around 1230pm.

## 2017-04-28 NOTE — Telephone Encounter (Signed)
Advised patient over the phone letter would be ready, asked him if he would like this faxed or left up front. He is going to have his father pick up. Advised him to call back if he would like this faxed somewhere since he is on a timeline for this. He has called the court office and has not heard anything back yet.

## 2017-04-28 NOTE — Telephone Encounter (Signed)
Patient called asking for his excuse note to be faxed to the district court [phone]

## 2017-04-28 NOTE — Telephone Encounter (Signed)
I have faxed twice this afternoon to number provided.

## 2017-04-30 ENCOUNTER — Ambulatory Visit: Payer: BLUE CROSS/BLUE SHIELD | Admitting: Podiatry

## 2017-05-07 ENCOUNTER — Ambulatory Visit (INDEPENDENT_AMBULATORY_CARE_PROVIDER_SITE_OTHER): Payer: BLUE CROSS/BLUE SHIELD | Admitting: Podiatry

## 2017-05-07 ENCOUNTER — Encounter: Payer: Self-pay | Admitting: Podiatry

## 2017-05-07 DIAGNOSIS — M79676 Pain in unspecified toe(s): Secondary | ICD-10-CM | POA: Diagnosis not present

## 2017-05-07 DIAGNOSIS — B351 Tinea unguium: Secondary | ICD-10-CM | POA: Diagnosis not present

## 2017-05-07 DIAGNOSIS — Z89512 Acquired absence of left leg below knee: Secondary | ICD-10-CM

## 2017-05-07 DIAGNOSIS — E1142 Type 2 diabetes mellitus with diabetic polyneuropathy: Secondary | ICD-10-CM

## 2017-05-07 NOTE — Progress Notes (Signed)
This patient presents the office for continued treatment of his long toenails. He says his nails are painful walking and wearing his shoes.  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  Patient has had an amputation below knee left.  He presents the office today for preventative foot care services. Patient is diabetic with diagnosed PVD.  GENERAL APPEARANCE: Alert, conversant. Appropriately groomed. No acute distress.  VASCULAR: Pedal pulses are not   palpable at  [facility] and PT right foot.  Capillary refill time is immediate to all digits,  Normal temperature gradient.   NEUROLOGIC: sensation is absent  to 5.07 monofilament at 5/5 sites bilateral.  Light touch is intact bilateral, Muscle strength normal.  MUSCULOSKELETAL: acceptable muscle strength, tone and stability bilateral.  Intrinsic muscluature intact bilateral.  Rectus appearance of foot and digits noted bilateral. DJD due to Charcot foot. NAILS  thick disfigured discolored nails, right foot 5  no evidence of bacterial infection or drainage. DERMATOLOGIC: skin color, texture, and turgor are within normal limits.  No preulcerative lesions or ulcers  are seen, no interdigital maceration noted.  No open lesions present.  . No drainage noted.   Onychomycosis right foot.  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  RTC 3 months.   Helane Gunther DPM

## 2017-05-28 ENCOUNTER — Ambulatory Visit (INDEPENDENT_AMBULATORY_CARE_PROVIDER_SITE_OTHER): Payer: BLUE CROSS/BLUE SHIELD | Admitting: Family

## 2017-05-28 ENCOUNTER — Encounter (INDEPENDENT_AMBULATORY_CARE_PROVIDER_SITE_OTHER): Payer: Self-pay | Admitting: Family

## 2017-05-28 DIAGNOSIS — Z89512 Acquired absence of left leg below knee: Secondary | ICD-10-CM | POA: Diagnosis not present

## 2017-05-28 DIAGNOSIS — E1151 Type 2 diabetes mellitus with diabetic peripheral angiopathy without gangrene: Secondary | ICD-10-CM

## 2017-05-28 NOTE — Progress Notes (Signed)
Office Visit Note   Patient: [name]           Date of Birth: 11-04-1950           MRN: [id number] Visit Date: 05/28/2017              Requested by: [name], MD [address] [place], [place] [address] PCP: [name], MD  Chief Complaint  Patient presents with  . Left Leg - Wound Check      HPI: The patient is a 66 year old gentleman see for concern of ulceration to distal aspect of left below knee amputation. Noticed earlier this week. Has had 3 different sockets made over the last year with [facility]. Also had modifications to socket.   Assessment & Plan: Visit Diagnoses:  1. 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 Diabetes mellitus with peripheral artery disease (HCC)     Plan: will cleanse daily and apply mupirocin dressing until healed. If callus persists follow with [facility] for pressure relief modifications to socket. Will follow up in office as needed. Discussed may need to minimize wear of prosthesis until healed.   Follow-Up Instructions: Return if symptoms worsen or fail to improve.   Ortho Exam  Patient is alert, oriented, no adenopathy, well-dressed, normal affect, normal respiratory effort. Has callused ulceration to distal residual limb from end bearing. Is 7 mm in diameter with central fissure. Fissure is 1 mm deep, scant bloody drainage. No surrounding erythema, purulence or sign of infection.  Imaging: No results found. No images are attached to the encounter.  Labs: Lab Results  Component Value Date   HGBA1C 9.1 (H) 10/24/2015    Orders:  No orders of the defined types were placed in this encounter.  No orders of the defined types were placed in this encounter.    Procedures: No procedures performed  Clinical Data: No additional findings.  ROS:  All other systems negative, except as noted in the HPI. Review of Systems  Constitutional: Negative for chills and fever.  Cardiovascular: Negative for  leg swelling.  Skin: Positive for wound. Negative for color change.    Objective: Vital Signs: There were no vitals taken for this visit.  Specialty Comments:  No specialty comments available.  PMFS History: Patient Active Problem List   Diagnosis Date Noted  . Below knee amputation status (HCC) 12/06/2015  . PAD (peripheral artery disease) (HCC) 10/24/2015  . Diabetes mellitus with peripheral artery disease (HCC) 07/30/2014  . Claudication in peripheral vascular disease (HCC) 07/30/2014  . IDDM (insulin dependent diabetes mellitus) (HCC) 01/23/2013  . CAD (coronary artery disease) 04/15/2011  . Hyperlipidemia 04/15/2011   Past Medical History:  Diagnosis Date  . Cigarette smoker   . Coronary artery disease   . Diabetes mellitus    type 1  x 50 yrs  . Diabetic retinopathy (HCC)   . Dyslipidemia   . Peripheral vascular disease (HCC)     Family History  Problem Relation Age of Onset  . Coronary artery disease Mother     Past Surgical History:  Procedure Laterality Date  . ABDOMINAL ANGIOGRAM N/A 05/29/2014   Procedure: ABDOMINAL ANGIOGRAM;  Surgeon: Pamella Pert, MD;  [facility] LAB;  Service: Cardiovascular;  Laterality: N/A;  . AMPUTATION Left 10/25/2015   Procedure: Left Foot 5th Ray Amputation;  Surgeon: Nadara Mustard, MD;  Location: [facility] OR;  Service: Orthopedics;  Laterality: Left;  . AMPUTATION Left 12/06/2015  Procedure: AMPUTATION BELOW KNEE;  Surgeon: Nadara Mustard, MD;  [facility] OR;  Service: Orthopedics;  Laterality: Left;  . CARDIAC CATHETERIZATION     EF is 55-60% and no wall motion abnormalities (long long time ago)  . FEMORAL-POPLITEAL BYPASS GRAFT Left 10/24/2015   Procedure: BYPASS GRAFT FEMORAL below knee POPLITEAL ARTERY with Left Saphenous Vein;  Surgeon: Nada Libman, MD;  Location: [facility] OR;  Service: Vascular;  Laterality: Left;  . FRACTURE SURGERY     left arm "many yrs ago"  . LOWER EXTREMITY ANGIOGRAM N/A 01/23/2014   Procedure: LOWER  EXTREMITY ANGIOGRAM;  Surgeon: Pamella Pert, MD;  Location: [facility] CATH LAB;  Service: Cardiovascular;  Laterality: N/A;  . LOWER EXTREMITY ANGIOGRAM N/A 07/31/2014   Procedure: LOWER EXTREMITY ANGIOGRAM;  Surgeon: Pamella Pert, MD;  Location: [facility] CATH LAB;  Service: Cardiovascular;  Laterality: N/A;  . LOWER EXTREMITY ANGIOGRAM Left 10/11/2015   Procedure: Lower Extremity Angiogram;  Surgeon: Nada Libman, MD;  [facility] LAB;  Service: Cardiovascular;  Laterality: Left;  . PERIPHERAL VASCULAR CATHETERIZATION Left 10/11/2015   Procedure: Peripheral Vascular Balloon Angioplasty;  Surgeon: Nada Libman, MD;  [facility] LAB;  Service: Cardiovascular;  Laterality: Left;  sfa failed unable to cross occluded sfa  . PERIPHERAL VASCULAR CATHETERIZATION N/A 10/11/2015   Procedure: Abdominal Aortogram;  Surgeon: Nada Libman, MD;  [facility] INV[facility] LAB;  Service: Cardiovascular;  Laterality: N/A;  . STUMP REVISION Left 03/02/2016   Procedure: Revision Left Below Knee Amputation;  Surgeon: Nadara Mustard, MD;  Location: [facility] OR;  Service: Orthopedics;  Laterality: Left;   Social History   Occupational History  . Not on file.   Social History Main Topics  . Smoking status: Current Every Day Smoker    Packs/day: 0.75    Years: 30.00    Types: Cigarettes  . Smokeless tobacco: Never Used  . Alcohol use Yes     Comment: on occasion wine  . Drug use: No  . Sexual activity: Not on file

## 2017-06-21 ENCOUNTER — Encounter (HOSPITAL_COMMUNITY): Payer: BLUE CROSS/BLUE SHIELD

## 2017-06-21 ENCOUNTER — Ambulatory Visit: Payer: BLUE CROSS/BLUE SHIELD | Admitting: Family

## 2017-08-06 ENCOUNTER — Ambulatory Visit: Payer: BLUE CROSS/BLUE SHIELD | Admitting: Podiatry

## 2017-08-11 ENCOUNTER — Telehealth (INDEPENDENT_AMBULATORY_CARE_PROVIDER_SITE_OTHER): Payer: Self-pay | Admitting: Orthopedic Surgery

## 2017-08-11 NOTE — Telephone Encounter (Signed)
[facility]

## 2017-08-11 NOTE — Telephone Encounter (Signed)
Patient called needing a new liner for his prosthetic because it has a hole in it. He requested that it be sent to [facility] so they can order it? Patients CB # [phone]

## 2017-08-18 IMAGING — CR DG CHEST 2V
2 series · 2 of 2 positions shown · non-contrast
Comparison: 09/01/2014

CLINICAL DATA: Gangrene of the left foot.

EXAM:
CHEST  2 VIEW

[w chest lat]
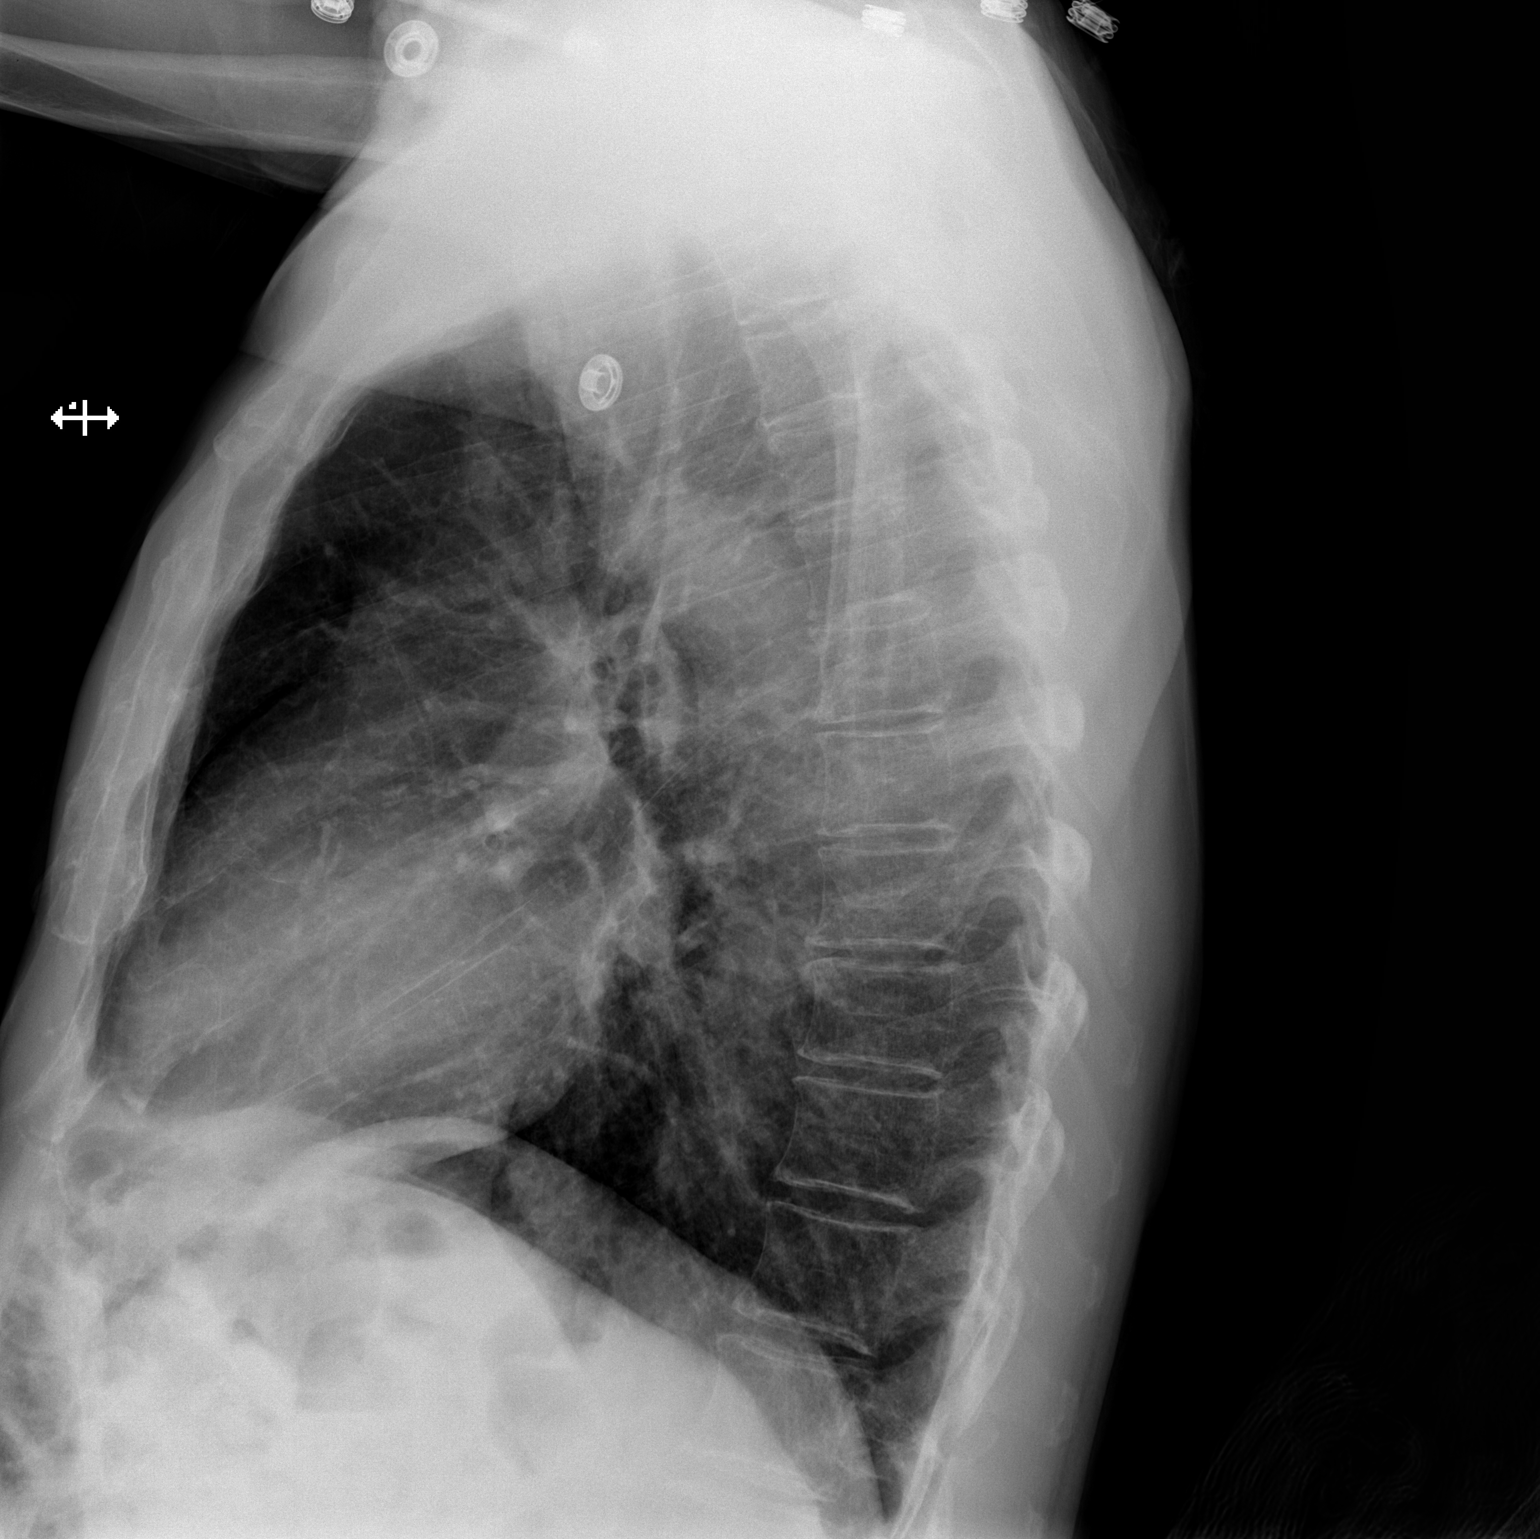

[x chest ap]
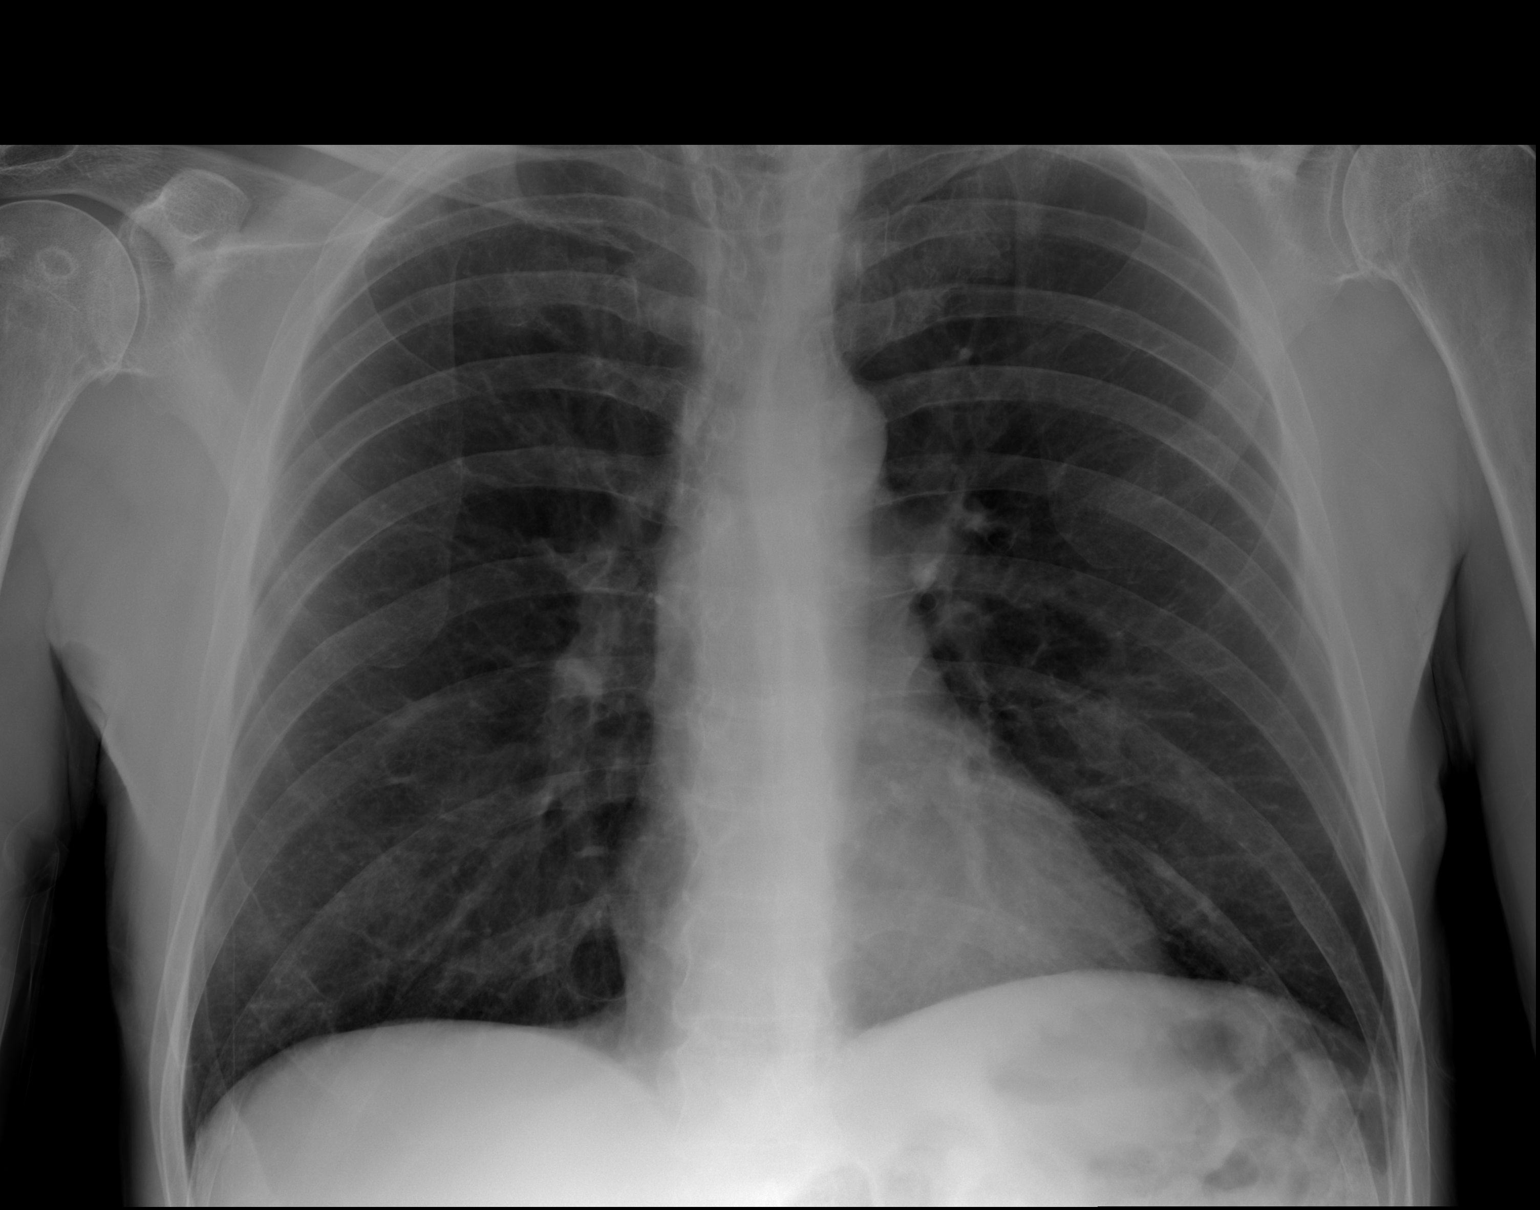

[2 of 2 positions shown; findings below may reference images not displayed]

FINDINGS: Normal heart size and mediastinal contours. No acute infiltrate or
edema. No effusion or pneumothorax. No acute osseous findings.
IMPRESSION: No active cardiopulmonary disease.

## 2017-09-02 ENCOUNTER — Telehealth (INDEPENDENT_AMBULATORY_CARE_PROVIDER_SITE_OTHER): Payer: Self-pay | Admitting: Family

## 2017-09-02 NOTE — Telephone Encounter (Signed)
05/28/2017 OV NOTE FAXED TO [facility] [phone]

## 2017-09-03 ENCOUNTER — Ambulatory Visit: Payer: Medicare Other | Admitting: Podiatry

## 2017-09-03 ENCOUNTER — Encounter: Payer: Self-pay | Admitting: Podiatry

## 2017-09-03 DIAGNOSIS — E1142 Type 2 diabetes mellitus with diabetic polyneuropathy: Secondary | ICD-10-CM | POA: Diagnosis not present

## 2017-09-03 DIAGNOSIS — M79676 Pain in unspecified toe(s): Secondary | ICD-10-CM | POA: Diagnosis not present

## 2017-09-03 DIAGNOSIS — B351 Tinea unguium: Secondary | ICD-10-CM | POA: Diagnosis not present

## 2017-09-03 DIAGNOSIS — Z89512 Acquired absence of left leg below knee: Secondary | ICD-10-CM

## 2017-09-03 NOTE — Progress Notes (Signed)
This patient presents the office for continued treatment of his long toenails. He says his nails are painful walking and wearing his shoes.  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  Patient has had an amputation below knee left.  He presents the office today for preventative foot care services. Patient is diabetic with diagnosed PVD.  GENERAL APPEARANCE: Alert, conversant. Appropriately groomed. No acute distress.  VASCULAR: Pedal pulses are not   [facility]pable [facility] right foot.  Capillary refill time is immediate to all digits,  Normal temperature gradient.   NEUROLOGIC: sensation is absent  to 5.07 monofilament at 5/5 sites bilateral.  Light touch is intact bilateral, Muscle strength normal.  MUSCULOSKELETAL: acceptable muscle strength, tone and stability bilateral.  Intrinsic muscluature intact bilateral.  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 NAILS  thick disfigured discolored nails, right foot 5  no evidence of bacterial infection or drainage. DERMATOLOGIC: skin color, texture, and turgor are within normal limits.  No preulcerative lesions or ulcers  are seen, no interdigital maceration noted.  No open lesions present.  . No drainage noted.   Onychomycosis right foot.  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  RTC 3 months.   Helane GuntherGregory Verina Galeno DPM

## 2017-09-21 DIAGNOSIS — R062 Wheezing: Secondary | ICD-10-CM | POA: Insufficient documentation

## 2017-10-21 DIAGNOSIS — R059 Cough, unspecified: Secondary | ICD-10-CM | POA: Insufficient documentation

## 2017-11-08 ENCOUNTER — Ambulatory Visit (INDEPENDENT_AMBULATORY_CARE_PROVIDER_SITE_OTHER): Payer: Medicare Other | Admitting: Orthopedic Surgery

## 2017-11-08 ENCOUNTER — Encounter (INDEPENDENT_AMBULATORY_CARE_PROVIDER_SITE_OTHER): Payer: Self-pay | Admitting: Orthopedic Surgery

## 2017-11-08 VITALS — Ht 71.0 in | Wt 160.0 lb

## 2017-11-08 DIAGNOSIS — Z89512 Acquired absence of left leg below knee: Secondary | ICD-10-CM | POA: Diagnosis not present

## 2017-11-08 NOTE — Progress Notes (Signed)
Office Visit Note   Patient: [name]           Date of Birth: 1951-05-30           MRN: [id number] Visit Date: 11/08/2017              Requested by: [name], [name], MD [address] [place], [place] [address] PCP: [name], [name], MD  Chief Complaint  Patient presents with  . Left Leg - Follow-up    BKA 12/06/15, revision 03/02/16      HPI: Patient is a 67 year old gentleman who is status post a left transtibial amputation.  Patient has instability with his socket with difficulty with ambulation he states he now has to use a walker he has no rotational stability and is unsteady due to the loose fitting prosthesis.  Assessment & Plan: Visit Diagnoses:  1. 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  Follow-Up Instructions: Return if symptoms worsen or fail to improve.   Ortho Exam  Patient is alert, oriented, no adenopathy, well-dressed, normal affect, normal respiratory effort. Examination patient has an antalgic gait he is unsteady he is using a walker due to the rotational instability with his prosthesis.  He has instability with varus and valgus stress as well as rotation.  There are no ulcers no cellulitis no signs of infection.  Imaging: No results found. No images are attached to the encounter.  Labs: Lab Results  Component Value Date   HGBA1C 9.1 (H) 10/24/2015    @LABSALLVALUES (HGBA1)@  Body mass index is 22.32 kg/m.  Orders:  No orders of the defined types were placed in this encounter.  No orders of the defined types were placed in this encounter.    Procedures: No procedures performed  Clinical Data: No additional findings.  ROS:  All other systems negative, except as noted in the HPI. Review of Systems  Objective: Vital Signs: Ht 5\' 11"  (1.803 m)   Wt 160 lb (72.6 kg)   BMI 22.32 kg/m   Specialty  Comments:  No specialty comments available.  PMFS History: Patient Active Problem List   Diagnosis Date Noted  . Below knee amputation status (HCC) 12/06/2015  . PAD (peripheral artery disease) (HCC) 10/24/2015  . Diabetes mellitus with peripheral artery disease (HCC) 07/30/2014  . Claudication in peripheral vascular disease (HCC) 07/30/2014  . IDDM (insulin dependent diabetes mellitus) (HCC) 01/23/2013  . CAD (coronary artery disease) 04/15/2011  . Hyperlipidemia 04/15/2011   Past Medical History:  Diagnosis Date  . Cigarette smoker   . Coronary artery disease   . Diabetes mellitus    type 1  x 50 yrs  . Diabetic retinopathy (HCC)   . Dyslipidemia   . Peripheral vascular disease (HCC)     Family History  Problem Relation Age of Onset  . Coronary artery disease Mother     Past Surgical History:  Procedure Laterality Date  . ABDOMINAL ANGIOGRAM N/A 05/29/2014   Procedure: ABDOMINAL ANGIOGRAM;  Surgeon: Pamella PertJagadeesh R Ganji, MD;  [facility];  Service: Cardiovascular;  Laterality: N/A;  . AMPUTATION Left 10/25/2015   Procedure: Left Foot 5th Ray Amputation;  Surgeon: Nadara MustardMarcus Marenda Accardi V, MD;  [facility];  Service: Orthopedics;  Laterality: Left;  . AMPUTATION Left 12/06/2015   Procedure: AMPUTATION BELOW KNEE;  Surgeon: Nadara MustardMarcus Pius Byrom V, MD;  [facility] OR;  Service: Orthopedics;  Laterality: Left;  . CARDIAC CATHETERIZATION     EF is 55-60% and no wall motion abnormalities (long long time ago)  . FEMORAL-POPLITEAL BYPASS GRAFT Left 10/24/2015   Procedure: BYPASS GRAFT FEMORAL below knee POPLITEAL ARTERY with Left Saphenous Vein;  Surgeon: Nada Libman, MD;  Location: [facility] OR;  Service: Vascular;  Laterality: Left;  . FRACTURE SURGERY     left arm "many yrs ago"  . LOWER EXTREMITY ANGIOGRAM N/A 01/23/2014   Procedure: LOWER EXTREMITY ANGIOGRAM;  Surgeon: Pamella Pert, MD;  Location: [facility] CATH LAB;  Service: Cardiovascular;  Laterality: N/A;  . LOWER EXTREMITY ANGIOGRAM N/A  07/31/2014   Procedure: LOWER EXTREMITY ANGIOGRAM;  Surgeon: Pamella Pert, MD;  Location: [facility] CATH LAB;  Service: Cardiovascular;  Laterality: N/A;  . LOWER EXTREMITY ANGIOGRAM Left 10/11/2015   Procedure: Lower Extremity Angiogram;  Surgeon: Nada Libman, MD;  [facility];  Service: Cardiovascular;  Laterality: Left;  . PERIPHERAL VASCULAR CATHETERIZATION Left 10/11/2015   Procedure: Peripheral Vascular Balloon Angioplasty;  Surgeon: Nada Libman, MD;  [facility] LAB;  Service: Cardiovascular;  Laterality: Left;  sfa failed unable to cross occluded sfa  . PERIPHERAL VASCULAR CATHETERIZATION N/A 10/11/2015   Procedure: Abdominal Aortogram;  Surgeon: Nada Libman, MD;  [facility] LAB;  Service: Cardiovascular;  Laterality: N/A;  . STUMP REVISION Left 03/02/2016   Procedure: Revision Left Below Knee Amputation;  Surgeon: Nadara Mustard, MD;  Location: [facility] OR;  Service: Orthopedics;  Laterality: Left;   Social History   Occupational History  . Not on file  Tobacco Use  . Smoking status: Current Every Day Smoker    Packs/day: 0.75    Years: 30.00    Pack years: 22.50    Types: Cigarettes  . Smokeless tobacco: Never Used  Substance and Sexual Activity  . Alcohol use: Yes    Comment: on occasion wine  . Drug use: No  . Sexual activity: Not on file

## 2017-12-02 ENCOUNTER — Telehealth (INDEPENDENT_AMBULATORY_CARE_PROVIDER_SITE_OTHER): Payer: Self-pay | Admitting: Orthopedic Surgery

## 2017-12-02 NOTE — Telephone Encounter (Signed)
Last 2 ov notes sent to [facility] along with rx/CMN

## 2017-12-03 ENCOUNTER — Ambulatory Visit: Payer: Medicare Other | Admitting: Podiatry

## 2017-12-03 ENCOUNTER — Encounter: Payer: Self-pay | Admitting: Podiatry

## 2017-12-03 DIAGNOSIS — M79676 Pain in unspecified toe(s): Secondary | ICD-10-CM | POA: Diagnosis not present

## 2017-12-03 DIAGNOSIS — B351 Tinea unguium: Secondary | ICD-10-CM | POA: Diagnosis not present

## 2017-12-03 DIAGNOSIS — E1142 Type 2 diabetes mellitus with diabetic polyneuropathy: Secondary | ICD-10-CM

## 2017-12-03 DIAGNOSIS — Z89512 Acquired absence of left leg below knee: Secondary | ICD-10-CM

## 2017-12-03 NOTE — Progress Notes (Signed)
This patient presents the office for continued treatment of his long toenails. He says his nails are painful walking and wearing his shoes.  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  Patient has had an amputation below knee left.  He presents the office today for preventative foot care services. Patient is diabetic with diagnosed PVD.  GENERAL APPEARANCE: Alert, conversant. Appropriately groomed. No acute distress.  VASCULAR: Pedal pulses are not   [facility]pable [facility]  [facility].  Capillary refill time is immediate to all digits,  Normal temperature gradient.   NEUROLOGIC: sensation is absent  to 5.07 monofilament at 5/5 sites bilateral.  Light touch is intact bilateral, Muscle strength normal.  MUSCULOSKELETAL: acceptable muscle strength, tone and stability bilateral.  Intrinsic muscluature intact bilateral.  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 NAILS  thick disfigured discolored nails, right foot 5  no evidence of bacterial infection or drainage. DERMATOLOGIC: skin color, texture, and turgor are within normal limits.  No preulcerative lesions or ulcers  are seen, no interdigital maceration noted.  No open lesions present.  . No drainage noted.   Onychomycosis right foot.  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  RTC 3 months.   Helane GuntherGregory Taydem Cavagnaro DPM

## 2018-02-07 ENCOUNTER — Encounter: Payer: Self-pay | Admitting: Physical Therapy

## 2018-02-07 ENCOUNTER — Ambulatory Visit: Payer: Medicare Other | Attending: Internal Medicine | Admitting: Physical Therapy

## 2018-02-07 ENCOUNTER — Other Ambulatory Visit: Payer: Self-pay

## 2018-02-07 DIAGNOSIS — M79662 Pain in left lower leg: Secondary | ICD-10-CM | POA: Insufficient documentation

## 2018-02-07 DIAGNOSIS — R29898 Other symptoms and signs involving the musculoskeletal system: Secondary | ICD-10-CM | POA: Diagnosis present

## 2018-02-07 DIAGNOSIS — R2681 Unsteadiness on feet: Secondary | ICD-10-CM

## 2018-02-07 DIAGNOSIS — G546 Phantom limb syndrome with pain: Secondary | ICD-10-CM | POA: Diagnosis present

## 2018-02-07 DIAGNOSIS — R2689 Other abnormalities of gait and mobility: Secondary | ICD-10-CM | POA: Diagnosis present

## 2018-02-07 NOTE — Therapy (Signed)
[facility] [address] [place], [place], [address] Phone: [phone]   Fax:  [phone]  Physical Therapy Evaluation  Patient Details  Name: [name] MRN: [id number] Date of Birth: 1950/10/29 Referring Provider: [name], MD   Encounter Date: 02/07/2018  PT End of Session - 02/07/18 1401    Visit Number  1    Number of Visits  7    Date for PT Re-Evaluation  05/06/18    Authorization Type  [employer] Medicare    PT Start Time  0845    PT Stop Time  0930    PT Time Calculation (min)  45 min    Activity Tolerance  Patient tolerated treatment well;Patient limited by pain    Behavior During Therapy  [facility] for tasks assessed/performed       Past Medical History:  Diagnosis Date  . Cigarette smoker   . Coronary artery disease   . Diabetes mellitus    type 1  x 50 yrs  . Diabetic retinopathy (HCC)   . Dyslipidemia   . Peripheral vascular disease [facility])     Past Surgical History:  Procedure Laterality Date  . ABDOMINAL ANGIOGRAM N/A 05/29/2014   Procedure: ABDOMINAL ANGIOGRAM;  Surgeon: Pamella Pert, MD;  Location: [facility] C[facility] LAB;  Service: Cardiovascular;  Laterality: N/A;  . AMPUTATION Left 10/25/2015   Procedure: Left Foot 5th Ray Amputation;  Surgeon: Nadara Mustard, MD;  Location: [facility] OR;  Service: Orthopedics;  Laterality: Left;  . AMPUTATION Left 12/06/2015   Procedure: AMPUTATION BELOW KNEE;  Surgeon: Nadara Mustard, MD;  Location: [facility] OR;  Service: Orthopedics;  Laterality: Left;  . CARDIAC CATHETERIZATION     EF is 55-60% and no wall motion abnormalities (long long time ago)  . FEMORAL-POPLITEAL BYPASS GRAFT Left 10/24/2015   Procedure: BYPASS GRAFT FEMORAL below knee POPLITEAL ARTERY with Left Saphenous Vein;  Surgeon: Nada Libman, MD;  Location: [facility] OR;  Service: Vascular;  Laterality: Left;  . FRACTURE SURGERY     left arm "many yrs ago"  . LOWER EXTREMITY ANGIOGRAM N/A 01/23/2014   Procedure: LOWER  EXTREMITY ANGIOGRAM;  Surgeon: Pamella Pert, MD;  Location: [facility] CATH LAB;  Service: Cardiovascular;  Laterality: N/A;  . LOWER EXTREMITY ANGIOGRAM N/A 07/31/2014   Procedure: LOWER EXTREMITY ANGIOGRAM;  Surgeon: Pamella Pert, MD;  Location: [facility] CATH LAB;  Service: Cardiovascular;  Laterality: N/A;  . LOWER EXTREMITY ANGIOGRAM Left 10/11/2015   Procedure: Lower Extremity Angiogram;  Surgeon: Nada Libman, MD;  Location: [facility] INVASIVE CV LAB;  Service: Cardiovascular;  Laterality: Left;  . PERIPHERAL VASCULAR CATHETERIZATION Left 10/11/2015   Procedure: Peripheral Vascular Balloon Angioplasty;  Surgeon: Nada Libman, MD;  Location: [facility] INVASIVE CV LAB;  Service: Cardiovascular;  Laterality: Left;  sfa failed unable to cross occluded sfa  . PERIPHERAL VASCULAR CATHETERIZATION N/A 10/11/2015   Procedure: Abdominal Aortogram;  Surgeon: Nada Libman, MD;  Location: [facility] INVASIVE CV LAB;  Service: Cardiovascular;  Laterality: N/A;  . STUMP REVISION Left 03/02/2016   Procedure: Revision Left Below Knee Amputation;  Surgeon: Nadara Mustard, MD;  Location: [facility] OR;  Service: Orthopedics;  Laterality: Left;    There were no vitals filed for this visit.   Subjective Assessment - 02/07/18 0851    Subjective  This 67yo male was referred to PT for evaluation on 02/04/2018 by [name], MD. His prosthesis is less than 2 months old. When the liner went bad, he  hurt his residual limb. Pain is better with new socket in early May. He is limited in gait by pain in posterior knee & residual limb.     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         [facility] Assessment - 02/07/18 0845      Assessment   Medical Diagnosis  Left Transtibial Amputation pain    Referring Provider  Creola CornJohn Russo, MD    Onset Date/Surgical Date  02/04/18 MD referral to PT    Hand Dominance  Right      Precautions   Precautions  Fall      Balance Screen   Has the patient fallen in the past 6 months  No    Has the patient had a decrease in activity level because of a fear of falling?   No    Is the patient reluctant to leave their home because of a fear of falling?   No      Home Environment   Living Environment  Private residence    Living Arrangements  Spouse/significant other    Type of Home  House    Home Access  Ramped entrance    Home Layout  One level    Home Equipment  [place] - single point;Walker - 2 wheels;Shower seat;Wheelchair - manual      Prior Function   Level of Independence  Independent;Independent with household mobility without device;Independent with community mobility without device;Independent with gait    Vocation  Retired    Leisure  farming, traveling      Posture/Postural Control   Posture/Postural Control  Postural limitations    Postural Limitations  Rounded Shoulders;Forward head;Weight shift right abduction of prosthesis      ROM / Strength   AROM / PROM / Strength  AROM;Strength      AROM   Overall AROM   Within functional limits for tasks performed      Strength   Overall Strength  Deficits;Within functional limits for  tasks performed    Overall Strength Comments  BUEs & RLE grossly [facility]    Strength Assessment Site  Hip;Knee    Left Hip Extension  4-/5    Left Hip ABduction  4-/5    Left Knee Flexion  4-/5    Left Knee Extension  4/5      Flexibility   Soft Tissue Assessment /  Muscle Length  yes    Hamstrings  tightness       Palpation   Palpation comment  gastroc atrophy of residual limb, gastroc & hamstring tendon tenderness, atrophy of muscles of residual limb with tibia/fibula prominence, distal fibula flaring, tenderness at distal fibula,  -Tinel's sign       Special Tests    Special Tests  --      Ambulation/Gait   Ambulation/Gait  Yes    Ambulation/Gait Assistance  5: Supervision    Ambulation Distance (Feet)  100 Feet    Assistive device  Prosthesis;None    Gait Pattern  Step-through pattern;Decreased arm swing - left;Decreased step length - right;Decreased stance time - left;Decreased stride length;Decreased weight shift to left;Left hip hike;Antalgic;Abducted - left    Ambulation Surface  Indoor;Level      Prosthetics Assessment - 02/07/18 0845      Prosthetics   Prosthetic Care Independent with  Prosthetic cleaning    Prosthetic Care Dependent with  Skin check;Residual limb care;Correct ply sock adjustment;Proper wear schedule/adjustment    Prosthetic Care Comments   Patient reports pain with weight bearing >15 minutes or gait >5 minutes. Later in day limited to less time.     Donning prosthesis   Supervision verbal cues on proper donning suction liners to limit piston    Current prosthetic wear tolerance (days/week)   daily    Current prosthetic wear tolerance (#hours/day)   wearing all awake hours    Edema  none    Residual limb condition   no wounds or skin issues noted.     K code/activity level with prosthetic use   K4 full community variable cadence, high level activities               Objective measurements completed on examination: See above findings.               PT Education - 02/07/18 0920    Education Details  isometric gastroc contraction 5 sec hold, 10 reps, multiple times /day sitting & standing;  ice massage to residual limb    Person(s) Educated  Patient;Caregiver(s)    Methods  Explanation;Demonstration;Tactile cues;Verbal cues    Comprehension  Verbalized understanding;Returned demonstration;Verbal cues required;Tactile cues required;Need further instruction       PT Short Term Goals - 02/07/18 2041      PT SHORT TERM GOAL #1   Title  Patient verbalizes proper donning of new suspension system to minimize pistoning. (All STGs Target Date 4th visit)    Time  6    Period  Weeks    Status  New    Target Date  03/21/18      PT SHORT TERM GOAL #2   Title  Patient verbalizes general undertanding of pain management techniques.     Time  6    Period  Weeks    Status  New    Target Date  03/21/18      PT SHORT TERM GOAL #3   Title  Patient verbalizes understanding of initial HEP.     Time  6    Period  Weeks    Status  New    Target Date  03/21/18      PT SHORT TERM GOAL #4   Title  Residual limb pain with standing & gait </=6/10.     Time  6    Period  Weeks    Status  New    Target Date  03/21/18  PT Long Term Goals - 02/07/18 2045      PT LONG TERM GOAL #1   Title  Patient demonstrates & verbalizes understanding of ongoing HEP. (All LTGs Target Date 05/06/2018)    Time  3    Period  Months    Status  New    Target Date  05/06/18      PT LONG TERM GOAL #2   Title  Patient verbalizes understanding of pain management techniques.     Time  3    Period  Months    Status  New    Target Date  05/06/18      PT LONG TERM GOAL #3   Title  Patient reports residual limb pain </= 5/10 with standing & gait.     Time  3    Period  Months    Status  New    Target Date  05/06/18      PT LONG TERM GOAL #4   Title  Patient reports 50% improvement in standing & gait tolerance.     Time  3     Period  Months    Status  New    Target Date  05/06/18             Plan - 02/07/18 2026    Clinical Impression Statement  This 66yo male underwent a Transtibial Amputation in 2017. 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 He developed residual limb pain over last 6 months. He has socket revision with change in design to silicon vacuum system. He required skilled instruction in proper donning to minimize pistoning in socket. His residual limb has severe atrophy but was able to produce limited contraction of gastroc muscle belly. He has tenderness to palpation of posterior residual limb more on lateral aspect. His pain ranges from 3/10 to 8/10 worse later in day. He requires skilled intervention to manage pain & improve function.     History and Personal Factors relevant to plan of care:  L TTA 2017, CAD, PVD, DM 1 x 50 years, retinopathy    Clinical Presentation  Stable    Clinical Decision Making  Low    Rehab Potential  Good    PT Frequency  1x / week every other week for 7 visits    PT Duration  12 weeks    PT Treatment/Interventions  ADLs/Self Care Home Management;Biofeedback;Cryotherapy;Electrical Stimulation;Moist Heat;Iontophoresis 4mg /ml Dexamethasone;Ultrasound;Contrast Bath;Gait training;Functional mobility training;Therapeutic activities;Therapeutic exercise;Balance training;Neuromuscular re-education;Patient/family education;Prosthetic Training;Manual techniques;Passive range of motion;Taping    PT Next Visit Plan  review pain changes to determine how ice massage is helping, prosthetic gait to decrease abduction,     Consulted and Agree with Plan of Care  Patient;Family member/caregiver    Family Member Consulted  girlfriend, Kizzie Bane       Patient will benefit from skilled therapeutic intervention in order to improve the following deficits and impairments:  Abnormal gait, Decreased activity tolerance, Decreased balance, Decreased mobility,  Decreased strength, Postural dysfunction, Pain, Prosthetic Dependency  Visit Diagnosis: Other abnormalities of gait and mobility  Unsteadiness on feet  Other symptoms and signs involving the musculoskeletal system  Phantom limb syndrome with pain (HCC)  Pain in left lower leg     Problem List Patient Active Problem List   Diagnosis Date Noted  . Below knee amputation status (HCC) 12/06/2015  . PAD (peripheral artery disease) (HCC) 10/24/2015  . Diabetes mellitus with peripheral artery disease (HCC) 07/30/2014  . Claudication in peripheral vascular disease (  HCC) 07/30/2014  . IDDM (insulin dependent diabetes mellitus) (HCC) 01/23/2013  . CAD (coronary artery disease) 04/15/2011  . Hyperlipidemia 04/15/2011    Audree Schrecengost PT, DPT 02/07/2018, 8:49 PM  [facility] [address] [place], [place], [address] Phone: [phone]   Fax:  [phone]  Name: MAYO FAULK MRN: [id number] Date of Birth: February 02, 1951

## 2018-02-22 ENCOUNTER — Ambulatory Visit: Payer: Medicare Other | Attending: Internal Medicine | Admitting: Physical Therapy

## 2018-02-22 ENCOUNTER — Encounter: Payer: Self-pay | Admitting: Physical Therapy

## 2018-02-22 DIAGNOSIS — R2689 Other abnormalities of gait and mobility: Secondary | ICD-10-CM | POA: Diagnosis not present

## 2018-02-22 DIAGNOSIS — G546 Phantom limb syndrome with pain: Secondary | ICD-10-CM | POA: Insufficient documentation

## 2018-02-22 DIAGNOSIS — R2681 Unsteadiness on feet: Secondary | ICD-10-CM | POA: Insufficient documentation

## 2018-02-22 DIAGNOSIS — M79662 Pain in left lower leg: Secondary | ICD-10-CM | POA: Insufficient documentation

## 2018-02-22 DIAGNOSIS — R29898 Other symptoms and signs involving the musculoskeletal system: Secondary | ICD-10-CM | POA: Diagnosis present

## 2018-02-23 NOTE — Therapy (Signed)
[facility] [address] [place], [place], [address] Phone: [phone]   Fax:  [phone]  Physical Therapy Treatment  Patient Details  Name: [name] BostonStephen L [name] MRN: [id number] Date of Birth: May 08, 1951 Referring Provider: Creola CornJohn Russo, MD   Encounter Date: 02/22/2018  PT End of Session - 02/22/18 1558    Visit Number  2    Number of Visits  7    Date for PT Re-Evaluation  05/06/18    Authorization Type  [employer]HC Medicare    PT Start Time  1315    PT Stop Time  1400    PT Time Calculation (min)  45 min    Activity Tolerance  Patient tolerated treatment well;Patient limited by pain    Behavior During Therapy  [facility] for tasks assessed/performed       Past Medical History:  Diagnosis Date  . Cigarette smoker   . Coronary artery disease   . Diabetes mellitus    type 1  x 50 yrs  . Diabetic retinopathy (HCC)   . Dyslipidemia   . Peripheral vascular disease [facility](HCC)     Past Surgical History:  Procedure Laterality Date  . ABDOMINAL ANGIOGRAM N/A 05/29/2014   Procedure: ABDOMINAL ANGIOGRAM;  Surgeon: Pamella PertJagadeesh R Ganji, MD;  [facility];  Service: Cardiovascular;  Laterality: N/A;  . AMPUTATION Left 10/25/2015   Procedure: Left Foot 5th Ray Amputation;  Surgeon: Nadara MustardMarcus Duda V, MD;  [facility];  Service: Orthopedics;  Laterality: Left;  . AMPUTATION Left 12/06/2015   Procedure: AMPUTATION BELOW KNEE;  Surgeon: Nadara MustardMarcus Duda V, MD;  Location: [facility] OR;  Service: Orthopedics;  Laterality: Left;  . CARDIAC CATHETERIZATION     EF is 55-60% and no wall motion abnormalities (long long time ago)  . FEMORAL-POPLITEAL BYPASS GRAFT Left 10/24/2015   Procedure: BYPASS GRAFT FEMORAL below knee POPLITEAL ARTERY with Left Saphenous Vein;  Surgeon: Nada LibmanVance W Brabham, MD;  [facility];  Service: Vascular;  Laterality: Left;  . FRACTURE SURGERY     left arm "many yrs ago"  . LOWER EXTREMITY ANGIOGRAM N/A 01/23/2014   Procedure: LOWER  EXTREMITY ANGIOGRAM;  Surgeon: Pamella PertJagadeesh R Ganji, MD;  [facility];  Service: Cardiovascular;  Laterality: N/A;  . LOWER EXTREMITY ANGIOGRAM N/A 07/31/2014   Procedure: LOWER EXTREMITY ANGIOGRAM;  Surgeon: Pamella PertJagadeesh R Ganji, MD;  [facility];  Service: Cardiovascular;  Laterality: N/A;  . LOWER EXTREMITY ANGIOGRAM Left 10/11/2015   Procedure: Lower Extremity Angiogram;  Surgeon: Nada LibmanVance W Brabham, MD;  [facility] INVASIVE CV LAB;  Service: Cardiovascular;  Laterality: Left;  . PERIPHERAL VASCULAR CATHETERIZATION Left 10/11/2015   Procedure: Peripheral Vascular Balloon Angioplasty;  Surgeon: Nada LibmanVance W Brabham, MD;  [facility]: [facility] INVASIVE CV LAB;  Service: Cardiovascular;  Laterality: Left;  sfa failed unable to cross occluded sfa  . PERIPHERAL VASCULAR CATHETERIZATION N/A 10/11/2015   Procedure: Abdominal Aortogram;  Surgeon: Nada LibmanVance W Brabham, MD;  [facility] INVASIVE CV LAB;  Service: Cardiovascular;  Laterality: N/A;  . STUMP REVISION Left 03/02/2016   Procedure: Revision Left Below Knee Amputation;  Surgeon: Nadara MustardMarcus V Duda, MD;  [facility]: [facility] OR;  Service: Orthopedics;  Laterality: Left;    There were no vitals filed for this visit.  Subjective Assessment - 02/22/18 1316    Subjective  He did ice massage and did not seem to make a difference. He also is making sure he resets suspension liners once fully seated in socket. He has been doing  isometric gastroc.     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  Yes    Pain Score  4  with walking 10 min 10/10 and sitting 1/10    Pain Location  Leg    Pain Orientation  Posterior;Left    Pain Descriptors / Indicators  Burning;Cramping;Throbbing;Spasm     Pain Type  Chronic pain    Pain Onset  More than a month ago    Pain Frequency  Constant    Aggravating Factors   walking too far, sits too long then walks,     Pain Relieving Factors  sitting with knee extended, foot of floor                        Prosthetic Gait with prosthesis only: PT demo weight shift over prosthesis in stance with pelvis not upper body. PT instructed to practice with left side near wall to increase confidence in lateral weight shift with pelvis.        PT Education - 02/22/18 1400    Education Details  HEP access code: [email or web address]   Posterior prosthetic socket design to increase hamstring tendon relief.     Person(s) Educated  Patient;Caregiver(s)    Methods  Explanation;Demonstration;Tactile cues;Verbal cues;Handout    Comprehension  Verbalized understanding;Returned demonstration;Verbal cues required;Tactile cues required;Need further instruction       PT Short Term Goals - 02/22/18 1728      PT SHORT TERM GOAL #1   Title  Patient verbalizes proper donning of new suspension system to minimize pistoning. (All STGs Target Date 4th visit)    Time  6    Period  Weeks    Status  On-going    Target Date  03/21/18      PT SHORT TERM GOAL #2   Title  Patient verbalizes general undertanding of pain management techniques.     Time  6    Period  Weeks    Status  On-going    Target Date  03/21/18      PT SHORT TERM GOAL #3   Title  Patient verbalizes understanding of initial HEP.     Time  6    Period  Weeks    Status  On-going    Target Date  03/21/18      PT SHORT TERM GOAL #4   Title  Residual limb pain with standing & gait </=6/10.     Time  6    Period  Weeks    Status  On-going    Target Date  03/21/18        PT Long Term Goals - 02/22/18 1729      PT LONG TERM GOAL #1   Title  Patient demonstrates & verbalizes understanding of ongoing HEP. (All LTGs Target Date 05/06/2018)    Time  3    Period  Months    Status   On-going    Target Date  05/06/18      PT LONG TERM GOAL #2   Title  Patient verbalizes understanding of pain management techniques.     Time  3    Period  Months    Status  On-going    Target Date  05/06/18      PT LONG TERM GOAL #3   Title  Patient reports residual limb pain </= 5/10 with standing & gait.     Time  3    Period  Months    Status  On-going    Target Date  05/06/18      PT LONG TERM GOAL #4   Title  Patient reports 50% improvement in standing & gait tolerance.     Time  3    Period  Months    Status  On-going    Target Date  05/06/18            Plan - 02/22/18 1733    Clinical Impression Statement  Patient seems to understand possible changes to posterior socket that may improve comfort to discuss with prosthetist.  He seems to understand gait cues to weight shift from pelvis not shoulders / upper body.  He also has general understanding of back exercises.     Rehab Potential  Good    PT Frequency  1x / week every other week for 7 visits    PT Duration  12 weeks    PT Treatment/Interventions  ADLs/Self Care Home Management;Biofeedback;Cryotherapy;Electrical Stimulation;Moist Heat;Iontophoresis 4mg /ml Dexamethasone;Ultrasound;Contrast Bath;Gait training;Functional mobility training;Therapeutic activities;Therapeutic exercise;Balance training;Neuromuscular re-education;Patient/family education;Prosthetic Training;Manual techniques;Passive range of motion;Taping    PT Next Visit Plan  check on back exercises & gait with pelvic weight shift, work toward STGs.     Consulted and Agree with Plan of Care  Patient;Family member/caregiver    Family Member Consulted  girlfriend, Kizzie Bane       Patient will benefit from skilled therapeutic intervention in order to improve the following deficits and impairments:  Abnormal gait, Decreased activity tolerance, Decreased balance, Decreased mobility, Decreased strength, Postural dysfunction, Pain, Prosthetic  Dependency  Visit Diagnosis: Other abnormalities of gait and mobility  Unsteadiness on feet  Other symptoms and signs involving the musculoskeletal system  Phantom limb syndrome with pain (HCC)  Pain in left lower leg     Problem List Patient Active Problem List   Diagnosis Date Noted  . Below knee amputation status (HCC) 12/06/2015  . PAD (peripheral artery disease) (HCC) 10/24/2015  . Diabetes mellitus with peripheral artery disease (HCC) 07/30/2014  . Claudication in peripheral vascular disease (HCC) 07/30/2014  . IDDM (insulin dependent diabetes mellitus) (HCC) 01/23/2013  . CAD (coronary artery disease) 04/15/2011  . Hyperlipidemia 04/15/2011    Vladimir Faster PT, DPT 02/23/2018, 6:02 PM  [facility] [address] [place], [place], [address] Phone: [phone]   Fax:  [phone]  Name: STANDLEY BARGO MRN: [id number] Date of Birth: November 22, 1950

## 2018-02-23 NOTE — Patient Instructions (Signed)
ACCESS CODE:   [email or web address]   . Standing Lumbar Extension with Counter - 3 reps - 1 sets - 15 hold - 3x daily - 7x weekly   . Trunk Rotation at Guardian Life InsuranceWall with Ball - 3 reps - 1 sets - 15 hold - 3x daily - 7x weekly   . Supine Posterior Pelvic Tilt - 10 reps - 1 sets - 5 hold - 1x daily - 7x weekly   . Hooklying Single Knee to Chest - 3 reps - 1 sets - 15 hold - 1x daily - 7x weekly   . Hip Flexion Stretch - 10 reps - 1 sets - 5 hold - 1x daily - 7x weekly   . Supine Lower Trunk Rotation - 3 reps - 1 sets - 15 hold - 1x daily - 7x weekly   . Seated Pelvic Tilt - 10 reps - 1 sets - 5 hold - 3x daily - 7x weekly

## 2018-03-07 ENCOUNTER — Ambulatory Visit: Payer: Medicare Other | Admitting: Physical Therapy

## 2018-03-07 ENCOUNTER — Encounter: Payer: Self-pay | Admitting: Physical Therapy

## 2018-03-07 DIAGNOSIS — R2689 Other abnormalities of gait and mobility: Secondary | ICD-10-CM

## 2018-03-07 DIAGNOSIS — M79662 Pain in left lower leg: Secondary | ICD-10-CM

## 2018-03-07 DIAGNOSIS — R2681 Unsteadiness on feet: Secondary | ICD-10-CM

## 2018-03-07 DIAGNOSIS — G546 Phantom limb syndrome with pain: Secondary | ICD-10-CM

## 2018-03-07 DIAGNOSIS — R29898 Other symptoms and signs involving the musculoskeletal system: Secondary | ICD-10-CM

## 2018-03-08 NOTE — Therapy (Signed)
[facility] [address] [place], [place], [address] Phone: [phone]   Fax:  [phone]  Physical Therapy Treatment  Patient Details  Name: [name] MRN: [id number] Date of Birth: 1951-02-24 Referring Provider: [name], MD   Encounter Date: 03/07/2018  PT End of Session - 03/07/18 1443    Visit Number  3    Number of Visits  7    Date for PT Re-Evaluation  05/06/18    Authorization Type  UHC Medicare    PT Start Time  1400    PT Stop Time  1444    PT Time Calculation (min)  44 min    Activity Tolerance  Patient tolerated treatment well;Patient limited by pain    Behavior During Therapy  [facility] for tasks assessed/performed       Past Medical History:  Diagnosis Date  . Cigarette smoker   . Coronary artery disease   . Diabetes mellitus    type 1  x 50 yrs  . Diabetic retinopathy (HCC)   . Dyslipidemia   . Peripheral vascular disease [facility])     Past Surgical History:  Procedure Laterality Date  . ABDOMINAL ANGIOGRAM N/A 05/29/2014   Procedure: ABDOMINAL ANGIOGRAM;  Surgeon: Pamella Pert, MD;  Location: [facility] CATH LAB;  Service: Cardiovascular;  Laterality: N/A;  . AMPUTATION Left 10/25/2015   Procedure: Left Foot 5th Ray Amputation;  Surgeon: Nadara Mustard, MD;  Location: [facility] OR;  Service: Orthopedics;  Laterality: Left;  . AMPUTATION Left 12/06/2015   Procedure: AMPUTATION BELOW KNEE;  Surgeon: Nadara Mustard, MD;  [facility] OR;  Service: Orthopedics;  Laterality: Left;  . CARDIAC CATHETERIZATION     EF is 55-60% and no wall motion abnormalities (long long time ago)  . FEMORAL-POPLITEAL BYPASS GRAFT Left 10/24/2015   Procedure: BYPASS GRAFT FEMORAL below knee POPLITEAL ARTERY with Left Saphenous Vein;  Surgeon: Nada Libman, MD;  Location[facility] OR;  Service: Vascular;  Laterality: Left;  . FRACTURE SURGERY     left arm "many yrs ago"  . LOWER EXTREMITY ANGIOGRAM N/A 01/23/2014   Procedure: LOWER  EXTREMITY ANGIOGRAM;  Surgeon: Pamella Pert, MD;  Location: [facility] CATH LAB;  Service: Cardiovascular;  Laterality: N/A;  . LOWER EXTREMITY ANGIOGRAM N/A 07/31/2014   Procedure: LOWER EXTREMITY ANGIOGRAM;  Surgeon: Pamella Pert, MD;  Location: [facility] CATH LAB;  Service: Cardiovascular;  Laterality: N/A;  . LOWER EXTREMITY ANGIOGRAM Left 10/11/2015   Procedure: Lower Extremity Angiogram;  Surgeon: Nada Libman, MD;  [facility] INVASIVE CV LAB;  Service: Cardiovascular;  Laterality: Left;  . PERIPHERAL VASCULAR CATHETERIZATION Left 10/11/2015   Procedure: Peripheral Vascular Balloon Angioplasty;  Surgeon: Nada Libman, MD;  [facility] INVASIVE CV LAB;  Service: Cardiovascular;  Laterality: Left;  sfa failed unable to cross occluded sfa  . PERIPHERAL VASCULAR CATHETERIZATION N/A 10/11/2015   Procedure: Abdominal Aortogram;  Surgeon: Nada Libman, MD;  [facility] INVASIVE CV LAB;  Service: Cardiovascular;  Laterality: N/A;  . STUMP REVISION Left 03/02/2016   Procedure: Revision Left Below Knee Amputation;  Surgeon: Nadara Mustard, MD;  [facility] OR;  Service: Orthopedics;  Laterality: Left;    There were no vitals filed for this visit.  Subjective Assessment - 03/07/18 1405    Subjective  He had socket slightly modified. It helped for a couple of days. Prosthetist was limited in amount of hamstring relief because it would cut into liners.  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  Yes    Pain Score  8  in last week, ranging 4 to 8/10    Pain Location  Leg    Pain Orientation  Left    Pain Descriptors / Indicators  Burning;Cramping;Throbbing    Pain Type  Chronic pain    Pain Onset  More  than a month ago    Pain Frequency  Constant    Aggravating Factors   walking too far, sits too  long then walks    Pain Relieving Factors  sitting with knee extended, foot off floor    Effect of Pain on Daily Activities  standing & walking, working on farm          PT assessed response to heat, ice, massage for pain management but does not seem to be helping.  Prosthetist did add small hamstring relieves to socket. Patient reported it helped for short period only.  PT reviewed proper donning of suction sleeve to seat limb fully into socket in standing and reseal the 2 sleeves. Also needs to limit pulling socks up so much that they minimize contact between 2 liners as this is how suction seal is created. Pt & girlfriend verbalize understanding.   PT demo, instructed in 2 seated hamstring stretches, 1 seated & 1 standing gastroc stretches. PT explained how stretching remainder of gastroc can help with posterior limb pain. PT also recommended continuing with isometric gastroc contractions to hypertrophy muscle. Pt verbalized understanding.    PT place TENS on posterior thigh & popliteal and 2nd lead medial to lateral knee proximal to socket. PT explained that electrodes could not be inside socket area but could be under liners. PT administered TENS for 15 minutes with standing, gait and seated stretches. Patient reported no change in pain with TENS.                     PT Short Term Goals - 02/22/18 1728      PT SHORT TERM GOAL #1   Title  Patient verbalizes proper donning of new suspension system to minimize pistoning. (All STGs Target Date 4th visit)    Time  6    Period  Weeks    Status  On-going    Target Date  03/21/18      PT SHORT TERM GOAL #2   Title  Patient verbalizes general undertanding of pain management techniques.     Time  6    Period  Weeks    Status  On-going    Target Date  03/21/18      PT SHORT TERM GOAL #3   Title  Patient verbalizes  understanding of initial HEP.     Time  6    Period  Weeks    Status  On-going    Target Date  03/21/18      PT SHORT TERM GOAL #4   Title  Residual limb pain with standing & gait </=6/10.     Time  6    Period  Weeks    Status  On-going    Target Date  03/21/18        PT Long Term Goals - 02/22/18 1729      PT LONG TERM GOAL #1   Title  Patient demonstrates & verbalizes understanding of ongoing HEP. (All LTGs Target Date 05/06/2018)    Time  3    Period  Months    Status  On-going    Target Date  05/06/18      PT LONG TERM GOAL #2   Title  Patient verbalizes understanding of pain management techniques.     Time  3    Period  Months    Status  On-going    Target Date  05/06/18      PT LONG TERM GOAL #3   Title  Patient reports residual limb pain </= 5/10 with standing & gait.     Time  3    Period  Months    Status  On-going    Target Date  05/06/18      PT LONG TERM GOAL #4   Title  Patient reports 50% improvement in standing & gait tolerance.     Time  3    Period  Months    Status  On-going    Target Date  05/06/18            Plan - 03/07/18 1858    Clinical Impression Statement  Patient does feel TENS helped with any pain relief. Patient appears to understand hamstring & gastroc stretches on BLEs. Patient wants to see Barnie Del, NP to see if she can help problem solve pain issues.     Rehab Potential  Good    PT Frequency  1x / week every other week for 7 visits    PT Duration  12 weeks    PT Treatment/Interventions  ADLs/Self Care Home Management;Biofeedback;Cryotherapy;Electrical Stimulation;Moist Heat;Iontophoresis 4mg /ml Dexamethasone;Ultrasound;Contrast Bath;Gait training;Functional mobility training;Therapeutic activities;Therapeutic exercise;Balance training;Neuromuscular re-education;Patient/family education;Prosthetic Training;Manual techniques;Passive range of motion;Taping    PT Next Visit Plan  check STGs. If PT does not appear to be  helping, then may discharge    Consulted and Agree with Plan of Care  Patient;Family member/caregiver    Family Member Consulted  girlfriend, Kizzie Bane       Patient will benefit from skilled therapeutic intervention in order to improve the following deficits and impairments:  Abnormal gait, Decreased activity tolerance, Decreased balance, Decreased mobility, Decreased strength, Postural dysfunction, Pain, Prosthetic Dependency  Visit Diagnosis: Other abnormalities of gait and mobility  Unsteadiness on feet  Other symptoms and signs involving the musculoskeletal system  Phantom limb syndrome with pain (HCC)  Pain in left lower leg     Problem List Patient Active Problem List   Diagnosis Date Noted  . Below knee amputation status (HCC) 12/06/2015  . PAD (peripheral artery disease) (HCC) 10/24/2015  . Diabetes mellitus with peripheral artery disease (HCC) 07/30/2014  . Claudication in peripheral vascular disease (HCC) 07/30/2014  . IDDM (insulin dependent diabetes mellitus) (HCC) 01/23/2013  . CAD (coronary artery disease) 04/15/2011  . Hyperlipidemia 04/15/2011    Breeona Waid PT, DPT 03/08/2018, 12:02 PM  [facility] [address] [place], [place], [address] Phone: [phone]   Fax:  [phone]  Name: [name] MRN: [id number] Date of Birth: August 23, 1951

## 2018-03-21 ENCOUNTER — Encounter: Payer: Medicare Other | Admitting: Physical Therapy

## 2018-03-21 ENCOUNTER — Other Ambulatory Visit (HOSPITAL_COMMUNITY): Payer: Self-pay | Admitting: Adult Health

## 2018-03-21 ENCOUNTER — Ambulatory Visit (HOSPITAL_COMMUNITY)
Admission: RE | Admit: 2018-03-21 | Discharge: 2018-03-21 | Disposition: A | Discharge status: Home / Self Care | Payer: Medicare Other | Source: Ambulatory Visit | Attending: Family | Admitting: Family

## 2018-03-21 DIAGNOSIS — I6529 Occlusion and stenosis of unspecified carotid artery: Secondary | ICD-10-CM

## 2018-03-21 DIAGNOSIS — R42 Dizziness and giddiness: Secondary | ICD-10-CM | POA: Diagnosis present

## 2018-04-04 ENCOUNTER — Encounter: Payer: Self-pay | Admitting: Physical Therapy

## 2018-04-04 ENCOUNTER — Ambulatory Visit: Payer: Medicare Other | Attending: Internal Medicine | Admitting: Physical Therapy

## 2018-04-04 DIAGNOSIS — R2689 Other abnormalities of gait and mobility: Secondary | ICD-10-CM | POA: Insufficient documentation

## 2018-04-04 DIAGNOSIS — R29898 Other symptoms and signs involving the musculoskeletal system: Secondary | ICD-10-CM | POA: Insufficient documentation

## 2018-04-04 DIAGNOSIS — R2681 Unsteadiness on feet: Secondary | ICD-10-CM | POA: Diagnosis present

## 2018-04-04 DIAGNOSIS — G546 Phantom limb syndrome with pain: Secondary | ICD-10-CM

## 2018-04-04 NOTE — Therapy (Signed)
[facility] [address] [place], [place], [address] Phone: [phone]   Fax:  [phone]  Physical Therapy Treatment  Patient Details  Name: [name] MRN: [id number] Date of Birth: 1950-11-06 Referring Provider: [name], MD   Encounter Date: 04/04/2018  PT End of Session - 04/04/18 1534    Visit Number  4    Number of Visits  7    Date for PT Re-Evaluation  05/06/18    Authorization Type  UHC Medicare    PT Start Time  1100    PT Stop Time  1125    PT Time Calculation (min)  25 min    Activity Tolerance  Patient tolerated treatment well;Patient limited by pain    Behavior During Therapy  [facility] for tasks assessed/performed       Past Medical History:  Diagnosis Date  . Cigarette smoker   . Coronary artery disease   . Diabetes mellitus    type 1  x 50 yrs  . Diabetic retinopathy ([place])   . Dyslipidemia   . Peripheral vascular disease [facility])     Past Surgical History:  Procedure Laterality Date  . ABDOMINAL ANGIOGRAM N/A 05/29/2014   Procedure: ABDOMINAL ANGIOGRAM;  Surgeon: Laverda Page, MD;  Location: [facility] CATH LAB;  Service: Cardiovascular;  Laterality: N/A;  . AMPUTATION Left 10/25/2015   Procedure: Left Foot 5th Ray Amputation;  Surgeon: Newt Minion, MD;  Location: [place];  Service: Orthopedics;  Laterality: Left;  . AMPUTATION Left 12/06/2015   Procedure: AMPUTATION BELOW KNEE;  Surgeon: Newt Minion, MD;  Location: [place];  Service: Orthopedics;  Laterality: Left;  . CARDIAC CATHETERIZATION     EF is 55-60% and no wall motion abnormalities (long long time ago)  . FEMORAL-POPLITEAL BYPASS GRAFT Left 10/24/2015   Procedure: BYPASS GRAFT FEMORAL below knee POPLITEAL ARTERY with Left Saphenous Vein;  Surgeon: Serafina Mitchell, MD;  Location: [place];  Service: Vascular;  Laterality: Left;  . FRACTURE SURGERY     left arm "many yrs ago"  . LOWER EXTREMITY ANGIOGRAM N/A 01/23/2014   Procedure: LOWER  EXTREMITY ANGIOGRAM;  Surgeon: Laverda Page, MD;  Location: [facility] C[facility] LAB;  Service: Cardiovascular;  Laterality: N/A;  . LOWER EXTREMITY ANGIOGRAM N/A 07/31/2014   Procedure: LOWER EXTREMITY ANGIOGRAM;  Surgeon: Laverda Page, MD;  Location: [facility] CATH LAB;  Service: Cardiovascular;  Laterality: N/A;  . LOWER EXTREMITY ANGIOGRAM Left 10/11/2015   Procedure: Lower Extremity Angiogram;  Surgeon: Serafina Mitchell, MD;  Location: [place] CV LAB;  Service: Cardiovascular;  Laterality: Left;  . PERIPHERAL VASCULAR CATHETERIZATION Left 10/11/2015   Procedure: Peripheral Vascular Balloon Angioplasty;  Surgeon: Serafina Mitchell, MD;  Location: [place] CV LAB;  Service: Cardiovascular;  Laterality: Left;  sfa failed unable to cross occluded sfa  . PERIPHERAL VASCULAR CATHETERIZATION N/A 10/11/2015   Procedure: Abdominal Aortogram;  Surgeon: Serafina Mitchell, MD;  Location: [employer] CV LAB;  Service: Cardiovascular;  Laterality: N/A;  . STUMP REVISION Left 03/02/2016   Procedure: Revision Left Below Knee Amputation;  Surgeon: Newt Minion, MD;  Location: [place];  Service: Orthopedics;  Laterality: Left;    There were no vitals filed for this visit.  Subjective Assessment - 04/04/18 1100    Subjective  He feels calf stretch is helping. Also Dr. Virgina Jock prescribed a cream & using at night which enables him to go to sleep within 5 minutes. He may try in  morning but directions say no clothes for 1 hour.     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 See STGs & LTGs.                         PT Education - 04/04/18 1120    Education Details  standing hamstring stretch    Person(s) Educated  Patient    Methods  Explanation;Demonstration;Verbal cues    Comprehension  Verbalized understanding;Returned demonstration       PT Short Term Goals - 04/04/18 1528      PT SHORT TERM GOAL #1   Title  Patient verbalizes proper donning of new suspension system to minimize pistoning. (All STGs Target Date 4th visit)    Baseline  met 04/04/2018    Time  6    Period  Weeks    Status  Achieved      PT SHORT TERM GOAL #2   Title  Patient verbalizes general undertanding of pain management techniques.     Baseline  met 04/04/2018    Time  6    Period  Weeks    Status  Achieved      PT SHORT TERM GOAL #3   Title  Patient verbalizes understanding of initial HEP.     Baseline  met 04/04/2018    Time  6    Period  Weeks    Status  Achieved      PT SHORT TERM GOAL #4   Title  Residual limb pain with standing & gait </=6/10.     Baseline  met 04/04/2018    Time  6    Period  Weeks    Status  Achieved        PT Long Term Goals - 04/04/18 1529      PT LONG TERM GOAL #1   Title  Patient demonstrates & verbalizes understanding of ongoing HEP. (All LTGs Target Date 05/06/2018)    Baseline  met 04/04/2018    Time  3    Period  Months    Status  Achieved      PT LONG TERM GOAL #2   Title  Patient verbalizes understanding of pain management techniques.     Baseline  met 04/04/2018    Time  3    Period  Months    Status  Achieved      PT LONG TERM  GOAL #3   Title   Patient reports residual limb pain </= 5/10 with standing & gait.     Baseline  met 04/04/2018    Time  3    Period  Months    Status  Achieved      PT LONG TERM GOAL #4   Title  Patient reports 50% improvement in standing & gait tolerance.     Baseline  met 04/04/2018    Time  3    Period  Months    Status  Achieved            Plan - 04/04/18 1530    Clinical Impression Statement  Patient met all LTGs & STGs. He reports walking & standing with less pain now. He still has pain up to 8/10 if pushes too much, but mostly ~5/10 with standing & gait. The medication cream prescribed by PCP appears to be helping along with stretches provided by PT.     Rehab Potential  Good    PT Frequency  1x / week   every other week for 7 visits   PT Duration  12 weeks    PT Treatment/Interventions  ADLs/Self Care Home Management;Biofeedback;Cryotherapy;Electrical Stimulation;Moist Heat;Iontophoresis 61m/ml Dexamethasone;Ultrasound;Contrast Bath;Gait training;Functional mobility training;Therapeutic activities;Therapeutic exercise;Balance training;Neuromuscular re-education;Patient/family education;Prosthetic Training;Manual techniques;Passive range of motion;Taping    PT Next Visit Plan  discharge PT    Consulted and Agree with Plan of Care  Patient    Family Member Consulted  --       Patient will benefit from skilled therapeutic intervention in order to improve the following deficits and impairments:  Abnormal gait, Decreased activity tolerance, Decreased balance, Decreased mobility, Decreased strength, Postural dysfunction, Pain, Prosthetic Dependency  Visit Diagnosis: Other abnormalities of gait and mobility  Unsteadiness on feet  Other symptoms and signs involving the musculoskeletal system  Phantom limb syndrome with pain ([facility]     Problem List Patient Active Problem List   Diagnosis Date Noted  . Below knee amputation status ([facility] 12/06/2015  . PAD (peripheral artery disease)  ([facility] 10/24/2015  . Diabetes mellitus with peripheral artery disease ([facility] 07/30/2014  . Claudication in peripheral vascular disease ([facility] 07/30/2014  . IDDM (insulin dependent diabetes mellitus) ([facility] [place] [place] 01/23/2013  . CAD (coronary artery disease) 04/15/2011  . Hyperlipidemia 04/15/2011    PHYSICAL THERAPY DISCHARGE SUMMARY  Visits from Start of Care: 4  Current functional level related to goals / functional outcomes: See above   Remaining deficits: Patient reports pain ~5/10 now with most standing & gait activities unless walks too far. If walks too far pain increases to 8/10. Once he sits, his pain recovers to 2/10 and decreases to 0/10 quickly.    Education / Equipment: LE stretches, prosthetic gait & pain management with ice massage, heat & stress reduction.   Plan: Patient agrees to discharge.  Patient goals were met. Patient is being discharged due to meeting the stated rehab goals.  ?????         Donnamaria Shands PT, DPT 04/04/2018, 3:34 PM  [facility]SS[address] [place] [address]Phone: [phone]  Fax:  [phone] Name: [name] RESNICKMRN: [id number] of Birth: 1Oct 21, 1952

## 2018-04-18 ENCOUNTER — Encounter: Payer: Medicare Other | Admitting: Physical Therapy

## 2018-05-02 ENCOUNTER — Encounter: Payer: Medicare Other | Admitting: Physical Therapy

## 2019-01-08 ENCOUNTER — Other Ambulatory Visit: Payer: Self-pay | Admitting: Cardiology

## 2019-02-22 ENCOUNTER — Telehealth (INDEPENDENT_AMBULATORY_CARE_PROVIDER_SITE_OTHER): Payer: Medicare Other | Admitting: Family

## 2019-02-22 DIAGNOSIS — Z89512 Acquired absence of left leg below knee: Secondary | ICD-10-CM | POA: Diagnosis not present

## 2019-02-24 ENCOUNTER — Other Ambulatory Visit: Payer: Self-pay | Admitting: Cardiology

## 2019-03-08 NOTE — Progress Notes (Signed)
Office Visit Note   Patient: [name]           Date of Birth: 1951-07-13           MRN: [id number] Visit Date: 02/22/2019              Requested by: [name], [place],  [place] [address] PCP: [name], MD  No chief complaint on file.     HPI: Patient is a 68 year old gentleman who is seen today over the phone complaining of some breakdown to his liners.  Has not had any recent injury.  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  His liner has broken down he is not getting a good fit he fears developing new ulcers having some pressure areas however he has no concerns no wounds.  Denies any breakdown of his skin.  Assessment & Plan: Visit Diagnoses:  1. Acquired absence of left leg below knee ([place])     Plan: We will provide an order for prosthesis supplies to [facility] we will get him set up with a new outer liners for his prosthetic.  He will call or return to the office should he have any concerns.  Follow-Up Instructions: No follow-ups on file.   Ortho Exam  Exam deferred as this was a telephone encounter.  Imaging: No results found. No images are attached to the encounter.  Labs: Lab Results  Component Value Date   HGBA1C 9.1 (H) 10/24/2015     Lab Results  Component Value Date   ALBUMIN 3.5 10/22/2015   ALBUMIN 3.9 09/01/2014    No results found for: MG No results found for: VD25OH  No results found for: PREALBUMIN CBC EXTENDED Latest Ref Rng & Units 03/02/2016 12/17/2015 12/13/2015  WBC 4.0 - 10.5 K/uL 13.6(H) 5.5 7.6  RBC 4.22 - 5.81 MIL/uL 4.06(L) - -  HGB 13.0 - 17.0 g/dL 13.0 10.0(A) 9.8(A)  HCT 39.0 - 52.0 % 37.5(L) 30(A) 30(A)  PLT 150 - 400 K/uL 498(H) 508(A) 453(A)  NEUTROABS 1.7 - 7.7 K/uL 10.4(H) 2 -  LYMPHSABS 0.7 - 4.0 K/uL 1.6 - -     There is no height or weight on file to calculate BMI.   Orders:  No orders of the defined types were placed in this encounter.  No orders of the defined types were placed in this encounter.    Procedures: No procedures performed  Clinical Data: No additional findings.  ROS:  All other systems negative, except as noted in the HPI. Review of Systems  Objective: Vital Signs: There were no vitals taken for this visit.  Specialty Comments:  No specialty comments available.  PMFS History: Patient Active Problem List   Diagnosis Date Noted  . Below knee amputation status 12/06/2015  . PAD (peripheral artery disease) ([place]) 10/24/2015  . Diabetes mellitus with peripheral artery disease ([place]) 07/30/2014  . Claudication in peripheral vascular disease ([place]) 07/30/2014  . IDDM (insulin dependent diabetes mellitus) ([place]) 01/23/2013  . CAD (coronary artery disease) 04/15/2011  . Hyperlipidemia 04/15/2011   Past Medical History:  Diagnosis Date  . Cigarette smoker   . Coronary artery disease   . Diabetes mellitus    type 1  x 50 yrs  . Diabetic retinopathy ([place])   . Dyslipidemia   . Peripheral vascular disease ([place])  Family History  Problem Relation Age of Onset  . Coronary artery disease Mother     Past Surgical History:  Procedure Laterality Date  . ABDOMINAL ANGIOGRAM N/A 05/29/2014   Procedure: ABDOMINAL ANGIOGRAM;  Surgeon: Pamella PertJagadeesh R Ganji, MD;  [facility];  Service: Cardiovascular;  Laterality: N/A;  . AMPUTATION Left 10/25/2015   Procedure: Left Foot 5th Ray Amputation;  Surgeon: Nadara MustardMarcus Duda V, MD;  [facility];  Service: Orthopedics;  Laterality: Left;  . AMPUTATION Left 12/06/2015   Procedure: AMPUTATION BELOW KNEE;  Surgeon: Nadara MustardMarcus Duda V, MD;  [facility];  Service: Orthopedics;  Laterality: Left;  . CARDIAC CATHETERIZATION     EF is 55-60% and no wall motion abnormalities (long long time ago)  . FEMORAL-POPLITEAL BYPASS GRAFT Left 10/24/2015   Procedure: BYPASS GRAFT FEMORAL below knee POPLITEAL  ARTERY with Left Saphenous Vein;  Surgeon: Nada LibmanVance W Brabham, MD;  [facility];  Service: Vascular;  Laterality: Left;  . FRACTURE SURGERY     left arm "many yrs ago"  . LOWER EXTREMITY ANGIOGRAM N/A 01/23/2014   Procedure: LOWER EXTREMITY ANGIOGRAM;  Surgeon: Pamella PertJagadeesh R Ganji, MD;  [facility];  Service: Cardiovascular;  Laterality: N/A;  . LOWER EXTREMITY ANGIOGRAM N/A 07/31/2014   Procedure: LOWER EXTREMITY ANGIOGRAM;  Surgeon: Pamella PertJagadeesh R Ganji, MD;  [facility];  Service: Cardiovascular;  Laterality: N/A;  . LOWER EXTREMITY ANGIOGRAM Left 10/11/2015   Procedure: Lower Extremity Angiogram;  Surgeon: Nada LibmanVance W Brabham, MD;  [facility];  Service: Cardiovascular;  Laterality: Left;  . PERIPHERAL VASCULAR CATHETERIZATION Left 10/11/2015   Procedure: Peripheral Vascular Balloon Angioplasty;  Surgeon: Nada LibmanVance W Brabham, MD;  [facility];  Service: Cardiovascular;  Laterality: Left;  sfa failed unable to cross occluded sfa  . PERIPHERAL VASCULAR CATHETERIZATION N/A 10/11/2015   Procedure: Abdominal Aortogram;  Surgeon: Nada LibmanVance W Brabham, MD;  [facility];  Service: Cardiovascular;  Laterality: N/A;  . STUMP REVISION Left 03/02/2016   Procedure: Revision Left Below Knee Amputation;  Surgeon: Nadara MustardMarcus V Duda, MD;  [facility];  Service: Orthopedics;  Laterality: Left;   Social History   Occupational History  . Not on file  Tobacco Use  . Smoking status: Current Every Day Smoker    Packs/day: 0.75    Years: 30.00    Pack years: 22.50    Types: Cigarettes  . Smokeless tobacco: Never Used  Substance and Sexual Activity  . Alcohol use: Yes    Comment: on occasion wine  . Drug use: No  . Sexual activity: Not on file

## 2019-05-19 ENCOUNTER — Other Ambulatory Visit: Payer: Self-pay

## 2019-05-19 MED ORDER — ROSUVASTATIN CALCIUM 10 MG PO TABS: 10.0000 mg | ORAL_TABLET | Freq: Every day | ORAL | 0 refills | Status: DC

## 2019-05-22 ENCOUNTER — Encounter: Payer: Self-pay | Admitting: Cardiology

## 2019-05-22 ENCOUNTER — Ambulatory Visit (INDEPENDENT_AMBULATORY_CARE_PROVIDER_SITE_OTHER): Payer: Medicare Other | Admitting: Cardiology

## 2019-05-22 ENCOUNTER — Other Ambulatory Visit: Payer: Self-pay

## 2019-05-22 VITALS — BP 141/66 | HR 61 | Temp 98.0°F | Ht 72.0 in | Wt 151.2 lb

## 2019-05-22 DIAGNOSIS — I739 Peripheral vascular disease, unspecified: Secondary | ICD-10-CM

## 2019-05-22 DIAGNOSIS — Z89512 Acquired absence of left leg below knee: Secondary | ICD-10-CM

## 2019-05-22 DIAGNOSIS — I2510 Atherosclerotic heart disease of native coronary artery without angina pectoris: Secondary | ICD-10-CM

## 2019-05-22 DIAGNOSIS — Z720 Tobacco use: Secondary | ICD-10-CM

## 2019-05-22 NOTE — Progress Notes (Signed)
Primary Physician/Referring:  [name], [name], MD  Patient ID: [name] BostonStephen L Gathright, male    DOB: 07/24/1951, 68 y.o.   MRN: [id number]  Chief Complaint  Patient presents with  . Hypertension  . PAD  . Follow-up    9745yr   HPI:    [name]  is a 68 y.o.  coronary artery disease and had non-ST elevation myocardial infarction,  moderate diffuse scattered coronary artery disease. Last stress test in 2015 revealed no ischemia and normal LV systolic function.  On 07/31/2014, underwent successful revascularization of the right SFA. 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 He is now able to walk with prosthesis. Unfortunately he continues to smoke 1/2 PPD.  No chest pain, has chronic dyspnea, state that he is unable to quit smoking. He has also had a history of SVT, was admitted and evaluated in the emergency room on 12/28/2010. No recurrence of symptoms of SVT.   He has type I DM controlled with insulin pump with last HgbA1c around 7% and hyperlipidemia, but unable to tolerate Atorva, Simva or Prava in past, but tolerating Crestor.   He reports that amlodipine was decreased by Dr, Timothy Lassousso after he c/o dizziness from 5 mg to 2.5 mg.   Past Medical History:  Diagnosis Date  . Cigarette smoker   . Coronary artery disease   . Diabetes mellitus    type 1  x 50 yrs  . Diabetic retinopathy (HCC)   . Dyslipidemia   . Peripheral vascular disease [facility](HCC)    Past Surgical History:  Procedure Laterality Date  . ABDOMINAL ANGIOGRAM N/A 05/29/2014   Procedure: ABDOMINAL ANGIOGRAM;  Surgeon: Pamella PertJagadeesh R Yvone Slape, MD;  [facility];  Service: Cardiovascular;  Laterality: N/A;  . AMPUTATION Left 10/25/2015   Procedure: Left Foot 5th Ray Amputation;  Surgeon: Nadara MustardMarcus Duda V, MD;  [facility];  Service: Orthopedics;  Laterality: Left;  . AMPUTATION Left 12/06/2015   Procedure: AMPUTATION BELOW KNEE;  Surgeon: Nadara MustardMarcus Duda V, MD;   [facility]: [facility] OR;  Service: Orthopedics;  Laterality: Left;  . CARDIAC CATHETERIZATION     EF is 55-60% and no wall motion abnormalities (long long time ago)  . FEMORAL-POPLITEAL BYPASS GRAFT Left 10/24/2015   Procedure: BYPASS GRAFT FEMORAL below knee POPLITEAL ARTERY with Left Saphenous Vein;  Surgeon: Nada LibmanVance W Brabham, MD;  [facility];  Service: Vascular;  Laterality: Left;  . FRACTURE SURGERY     left arm "many yrs ago"  . LOWER EXTREMITY ANGIOGRAM N/A 01/23/2014   Procedure: LOWER EXTREMITY ANGIOGRAM;  Surgeon: Pamella PertJagadeesh R Uma Jerde, MD;  [facility];  Service: Cardiovascular;  Laterality: N/A;  . LOWER EXTREMITY ANGIOGRAM N/A 07/31/2014   Procedure: LOWER EXTREMITY ANGIOGRAM;  Surgeon: Pamella PertJagadeesh R Alize Acy, MD;  [facility];  Service: Cardiovascular;  Laterality: N/A;  . LOWER EXTREMITY ANGIOGRAM Left 10/11/2015   Procedure: Lower Extremity Angiogram;  Surgeon: Nada LibmanVance W Brabham, MD;  Location: [facility] INVASIVE CV LAB;  Service: Cardiovascular;  Laterality: Left;  . PERIPHERAL VASCULAR CATHETERIZATION Left 10/11/2015   Procedure: Peripheral Vascular Balloon Angioplasty;  Surgeon: Nada LibmanVance W Brabham, MD;  [facility]: [facility] INVASIVE CV LAB;  Service: Cardiovascular;  Laterality: Left;  sfa failed unable to cross occluded sfa  . PERIPHERAL VASCULAR CATHETERIZATION N/A 10/11/2015   Procedure: Abdominal Aortogram;  Surgeon: Nada LibmanVance W Brabham, MD;  Location: [facility] INVASIVE CV LAB;  Service: Cardiovascular;  Laterality: N/A;  .  STUMP REVISION Left 03/02/2016   Procedure: Revision Left Below Knee Amputation;  Surgeon: Nadara Mustard, MD;  Location: [facility] OR;  Service: Orthopedics;  Laterality: Left;   Social History   Socioeconomic History  . Marital status: Widowed    Spouse name: Not on file  . Number of children: 2  . Years of education: Not on file  . Highest education level: Not on file  Occupational History  . Not on file  Social Needs  . Financial resource strain: Not on file  . Food insecurity     Worry: Not on file    Inability: Not on file  . Transportation needs    Medical: Not on file    Non-medical: Not on file  Tobacco Use  . Smoking status: Current Every Day Smoker    Packs/day: 1.00    Years: 30.00    Pack years: 30.00    Types: Cigarettes  . Smokeless tobacco: Never Used  Substance and Sexual Activity  . Alcohol use: Yes    Comment: on occasion wine  . Drug use: No  . Sexual activity: Not on file  Lifestyle  . Physical activity    Days per week: Not on file    Minutes per session: Not on file  . Stress: Not on file  Relationships  . Social Musician on phone: Not on file    Gets together: Not on file    Attends religious service: Not on file    Active member of club or organization: Not on file    Attends meetings of clubs or organizations: Not on file    Relationship status: Not on file  . Intimate partner violence    Fear of current or ex partner: Not on file    Emotionally abused: Not on file    Physically abused: Not on file    Forced sexual activity: Not on file  Other Topics Concern  . Not on file  Social History Narrative  . Not on file   ROS  Review of Systems  Constitution: Negative for chills, decreased appetite, malaise/fatigue and weight gain.  Cardiovascular: Negative for dyspnea on exertion, leg swelling and syncope.  Respiratory: Positive for cough (chronic) and shortness of breath (stable).   Endocrine: Negative for cold intolerance.  Hematologic/Lymphatic: Does not bruise/bleed easily.  Musculoskeletal: Positive for muscle cramps. Negative for joint swelling. Neck pain: right leg at night.  Gastrointestinal: Negative for abdominal pain, anorexia, change in bowel habit, hematochezia and melena.  Neurological: Negative for headaches and light-headedness.  Psychiatric/Behavioral: Negative for depression and substance abuse.  All other systems reviewed and are negative.  Objective   Vitals with BMI 05/22/2019 11/08/2017  07/27/2016  Height     Weight 151 lbs 3 oz 160 lbs 160 lbs  BMI 20.5 22.33 22.4  Systolic 141 - -  Diastolic 66 - -  Pulse 61 - -    Blood pressure (!) 141/66, pulse 61, temperature 98 F (36.7 C), height 6' (1.829 m), weight 151 lb 3.2 oz (68.6 kg), SpO2 97 %. Body mass index is 20.51 kg/m.   Physical Exam  Constitutional: He appears well-developed and well-nourished. No distress.  Appears older than stated age  HENT:  Head: Atraumatic.  Eyes: Conjunctivae are normal.  Neck: Neck supple. No JVD present. No thyromegaly present.  Cardiovascular: Normal rate, regular rhythm and normal heart sounds. Exam reveals no gallop.  No murmur heard. Pulses:  Carotid pulses are 2+ on the right side and 2+ on the left side.      Femoral pulses are 2+ on the right side and 2+ on the left side.      Popliteal pulses are 0 on the right side. Left popliteal pulse not accessible.       Dorsalis pedis pulses are 0 on the right side. Left dorsalis pedis pulse not accessible.       Posterior tibial pulses are 0 on the right side. Left posterior tibial pulse not accessible.  Capillary refill normal right leg. Left BKA. No edema. No JVD  Pulmonary/Chest: Effort normal and breath sounds normal.  Abdominal: Soft. Bowel sounds are normal.  Musculoskeletal: Normal range of motion.  Neurological: He is alert.  Skin: Skin is warm and dry.  Psychiatric: He has a normal mood and affect.   Radiology: No results found.  Laboratory examination:   No results for input(s): NA, K, CL, CO2, GLUCOSE, BUN, CREATININE, CALCIUM, GFRNONAA, GFRAA in the last 8760 hours. CMP Latest Ref Rng & Units 03/02/2016 12/17/2015 12/13/2015  Glucose 65 - 99 mg/dL 345(H) - -  BUN 6 - 20 mg/dL 28(H) 17 22(A)  Creatinine 0.61 - 1.24 mg/dL 1.15 0.8 0.8  Sodium 135 - 145 mmol/L 133(L) 141 136(A)  Potassium 3.5 - 5.1 mmol/L 5.7(H) 4.4 5.2  Chloride 101 - 111 mmol/L 97(L) - -  CO2 22 - 32 mmol/L 22 - -  Calcium  8.9 - 10.3 mg/dL 9.3 - -  Total Protein 6.5 - 8.1 g/dL - - -  Total Bilirubin 0.3 - 1.2 mg/dL - - -  Alkaline Phos 25 - 125 U/L - 77 -  AST 14 - 40 U/L - 10(A) -  ALT 10 - 40 U/L - 10 -   CBC Latest Ref Rng & Units 03/02/2016 12/17/2015 12/13/2015  WBC 4.0 - 10.5 K/uL 13.6(H) 5.5 7.6  Hemoglobin 13.0 - 17.0 g/dL 13.0 10.0(A) 9.8(A)  Hematocrit 39.0 - 52.0 % 37.5(L) 30(A) 30(A)  Platelets 150 - 400 K/uL 498(H) 508(A) 453(A)   Lipid Panel  No results found for: CHOL, TRIG, HDL, CHOLHDL, VLDL, LDLCALC, LDLDIRECT HEMOGLOBIN A1C Lab Results  Component Value Date   HGBA1C 9.1 (H) 10/24/2015   MPG 214 10/24/2015   TSH No results for input(s): TSH in the last 8760 hours. Medications   Allergies  Allergen Reactions  . Simvastatin Other (See Comments)    MYALGIAS, MUSCLE WEAKNESS      Prior to Admission medications   Medication Sig Start Date End Date Taking? Authorizing Provider  acetaminophen (TYLENOL) 500 MG tablet Take 1,000 mg by mouth 2 (two) times daily as needed (pain).    Yes [provider]  amLODipine (NORVASC) 5 MG tablet TAKE 1 TABLET BY MOUTH  DAILY 01/09/19  Yes Adrian Prows, MD  aspirin EC 81 MG tablet Take 243 mg by mouth daily.    Yes [provider]  baclofen (LIORESAL) 20 MG tablet Take 20 mg by mouth daily as needed (for leg cramps). Reported on 12/05/2015 09/09/15  Yes [provider]  calcium carbonate (TUMS - DOSED IN MG ELEMENTAL CALCIUM) 500 MG chewable tablet Chew 2 tablets by mouth every 6 (six) hours as needed for indigestion or heartburn.   Yes [provider]  cilostazol (PLETAL) 100 MG tablet Take 1 tablet (100 mg total) by mouth 2 (two) times daily. Patient taking differently: Take 100 mg by mouth daily.  07/31/14  Yes Adrian Prows, MD  folic acid (FOLVITE) 800 MCG tablet Take 1,600 mcg by mouth daily.    Yes [provider]  HUMALOG 100 UNIT/ML injection PUMP 01/09/19  Yes [provider]  metoprolol  succinate (TOPROL-XL) 25 MG 24 hr tablet Take 1 tablet (25 mg total) by mouth daily. 02/14/13  Yes Nahser, Deloris Ping, MD  Multiple Vitamins-Minerals (MULTIVITAMIN GUMMIES ADULT PO) Take 2 tablets by mouth daily.    Yes [provider]  NOVOLOG 100 UNIT/ML injection Inject into the skin continuous. Via insulin pump 02/11/16  Yes [provider]  ramipril (ALTACE) 10 MG tablet Take 10 mg by mouth daily.     Yes [provider]  rosuvastatin (CRESTOR) 10 MG tablet Take 1 tablet (10 mg total) by mouth daily. 05/19/19  Yes Yates Decamp, MD     Current Outpatient Medications  Medication Instructions  . acetaminophen (TYLENOL) 1,000 mg, Oral, 2 times daily PRN  . amLODipine (NORVASC) 5 MG tablet TAKE 1 TABLET BY MOUTH  DAILY  . aspirin EC 243 mg, Oral, Daily  . baclofen (LIORESAL) 20 mg, Oral, Daily PRN, Reported on 12/05/2015  . calcium carbonate (TUMS - DOSED IN MG ELEMENTAL CALCIUM) 500 MG chewable tablet 2 tablets, Oral, Every 6 hours PRN  . cilostazol (PLETAL) 100 mg, Oral, 2 times daily  . folic acid (FOLVITE) 1,600 mcg, Daily  . HUMALOG 100 UNIT/ML injection PUMP  . metoprolol succinate (TOPROL-XL) 25 mg, Oral, Daily  . Multiple Vitamins-Minerals (MULTIVITAMIN GUMMIES ADULT PO) 2 tablets, Oral, Daily  . NOVOLOG 100 UNIT/ML injection Subcutaneous, Continuous, Via insulin pump  . ramipril (ALTACE) 10 mg, Daily  . rosuvastatin (CRESTOR) 10 mg, Oral, Daily    Cardiac Studies:   Coronary Angiogram 11/18/2004: Mild diffuse luminal irregularity, 40% stenosis in the proximal RCA, 30-40% in the mid and 50-60% in the distal. EF 55-60%.  Lower extremity arterial duplex 08/14/2013 ([facility]): Severe bilateral diffuse fibrocalcific plaque, right mid SFA occluded and is collateralized distally. High-grade stenosis in the left proximal popliteal artery. Cannot exclude left distal SFA/popliteal artery occlusion. ABI on the left 1.23, right was noncompressible.  Lexiscan sestamibi stress  test 10/23/2013: 1. Resting EKG NSR, early repolarization, stress EKG was non-diagnostic for ischemia. No ST-T changes of ischemia noted with pharmacologic stress testing. Stress symptoms included lightheadedness. Stress terminated due to completion of protocol. 2. The perfusion study demonstrated normal isotope uptake both at rest and stress. There was no evidence of ischemia or scar. Dynamic gated images reveal normal wall motion and endocardial thickening. Left ventricular ejection fraction was estimated to be 63%.  PV Angiogram [07/31/2014]: Peripheral arteriogram is 07/31/2014: right SFA Atherectomy with Crosser catheter, PTA with angiosculpt balloon followed by drug-coated balloon angioplasty with a 6.0 x 1 50 mm Lutonix Balloon. 100% to 0%. Three-vessel runoff to the right knee.  Lower Extremity Dopplers [06/05/2016]: occluded right SFA, no flow in the tibioperoneal trunk on the right, unable to pain ABI due to calcification, monophasic waveforms in the PT and DP.  Carotid duplex 03/01/2018: Right Carotid: Velocities in the right ICA are consistent with a 1-39% stenosis. Left Carotid: Velocities in the left ICA are consistent with a 1-39% stenosis. Vertebrals:  Bilateral vertebral arteries demonstrate antegrade flow. Subclavians: Normal flow hemodynamics were seen in bilateral subclavian arteries.  Assessment     ICD-10-CM   1. PAD (peripheral artery disease) (HCC)  I73.9 EKG 12-Lead  2. Coronary artery disease involving native coronary artery of native heart without angina pectoris  I25.10   3. Left  below-knee amputee (HCC)  Z89.512   4. Tobacco use  Z72.0     EKG 05/22/2019: Normal sinus rhythm at rate of 61 bpm, normal axis.  Early repolarization.  No evidence of ischemia.  Recommendations:   Patient is here on annual visit and follow-up of peripheral arterial disease and coronary artery disease, is presently doing well and essentially remains mildly symptomatic with symptoms of  claudication, no limb threatening ischemia or acute arterial insufficiency involving the right leg.  Left below-knee amputation stump noted. He plans on having right leg bypass surgery after Covid 19 settles down.   From cardiac standpoint he has not had any angina pectoris.  EKG reveals normal sinus rhythm.  Blood pressure slightly elevated today, he has been taking amlodipine 2.5 mg in the evening instead of 5 mg, advised him to switch to taking it in the morning at 5 mg once a day and see if he would tolerate and watch out for dizziness. He has been taking altace and amlodipine at night. Prefer to control Blood pressure controlled at less than 130 mmHg systolic in view of his diabetes mellitus.  Patient will try this.  States that his lipids are well controlled, diabetes is also well controlled.  Unfortunately he is having heart and trying to quit smoking, still smoking about one pack of cigarettes a day.  No changes in her medications were done today.  I'll see him back in a year.  Yates Decamp, MD, [facility] 05/22/2019, 10:08 AM [facility] [place] Pager: [phone] Office: [phone] If no answer Cell [phone]

## 2019-06-21 ENCOUNTER — Other Ambulatory Visit: Payer: Self-pay | Admitting: Cardiology

## 2019-08-15 ENCOUNTER — Other Ambulatory Visit: Payer: Self-pay | Admitting: Cardiology

## 2019-09-15 ENCOUNTER — Other Ambulatory Visit: Payer: Self-pay

## 2019-09-15 ENCOUNTER — Ambulatory Visit: Payer: Medicare Other | Admitting: Family

## 2019-09-15 DIAGNOSIS — L84 Corns and callosities: Secondary | ICD-10-CM

## 2019-09-15 DIAGNOSIS — L97401 Non-pressure chronic ulcer of unspecified heel and midfoot limited to breakdown of skin: Secondary | ICD-10-CM

## 2019-09-15 NOTE — Progress Notes (Signed)
Office Visit Note   Patient: [name]           Date of Birth: 10/14/1950           MRN: [id number] Visit Date: 09/15/2019              Requested by: [name], MD [address] [place],  [place] [address] PCP: [name], MD  Chief Complaint  Patient presents with  . Right Foot - Pain      HPI: Patient is a 69 year old gentleman with a history of a left below-knee amputation who has new fissure that is tender to his right heel.  He has been dealing with dry skin of his heel for quite some time he is states he does not often apply lotion has recently had a crack just in the last week that is quite tender there is no drainage he denies fevers or chills has been placing some new skin padding in his shoe to relieve pressure  Assessment & Plan: Visit Diagnoses:  1. Callus of heel   2. Heel ulcer, unspecified laterality, limited to breakdown of skin (HCC)     Plan: Discussed using Eucerin twice daily.  He will keep a close eye on the fissure.  Feel that this will heal uneventfully he will offload pressure in his shoe wear follow-up in the office as needed  Follow-Up Instructions: Return if symptoms worsen or fail to improve.   Ortho Exam  Patient is alert, oriented, no adenopathy, well-dressed, normal affect, normal respiratory effort. On examination of the right foot he has no edema or erythema to his lower leg.  There is a 5 mm crack to his heel laterally there is surrounding callus and dry peeling skin there is some tenderness directly over the open ulcer there is 1 mm of depth there is no drainage no surrounding erythema no odor no sign of infection  Imaging: No results found. No images are attached to the encounter.  Labs: Lab Results  Component Value Date   HGBA1C 9.1 (H) 10/24/2015     Lab Results  Component Value Date   ALBUMIN 3.5 10/22/2015   ALBUMIN 3.9 09/01/2014    No results found for: MG No results found for: VD25OH  No results found for:  PREALBUMIN CBC EXTENDED Latest Ref Rng & Units 03/02/2016 12/17/2015 12/13/2015  WBC 4.0 - 10.5 K/uL 13.6(H) 5.5 7.6  RBC 4.22 - 5.81 MIL/uL 4.06(L) - -  HGB 13.0 - 17.0 g/dL 53.2 10.0(A) 9.8(A)  HCT 39.0 - 52.0 % 37.5(L) 30(A) 30(A)  PLT 150 - 400 K/uL 498(H) 508(A) 453(A)  NEUTROABS 1.7 - 7.7 K/uL 10.4(H) 2 -  LYMPHSABS 0.7 - 4.0 K/uL 1.6 - -     There is no height or weight on file to calculate BMI.  Orders:  No orders of the defined types were placed in this encounter.  No orders of the defined types were placed in this encounter.    Procedures: No procedures performed  Clinical Data: No additional findings.  ROS:  All other systems negative, except as noted in the HPI. Review of Systems  Constitutional: Negative for chills and fever.  Cardiovascular: Negative for leg swelling.  Skin: Positive for wound. Negative for color change.    Objective: Vital Signs: There were no vitals taken for this visit.  Specialty Comments:  No specialty comments available.  PMFS History: Patient Active Problem List   Diagnosis Date Noted  . Below knee amputation status 12/06/2015  .  PAD (peripheral artery disease) ([place]) 10/24/2015  . Diabetes mellitus with peripheral artery disease ([place]) 07/30/2014  . Claudication in peripheral vascular disease ([place]) 07/30/2014  . IDDM (insulin dependent diabetes mellitus) 01/23/2013  . CAD (coronary artery disease) 04/15/2011  . Hyperlipidemia 04/15/2011   Past Medical History:  Diagnosis Date  . Cigarette smoker   . Coronary artery disease   . Diabetes mellitus    type 1  x 50 yrs  . Diabetic retinopathy ([place])   . Dyslipidemia   . Peripheral vascular disease ([place])     Family History  Problem Relation Age of Onset  . Coronary artery disease Mother     Past Surgical History:  Procedure Laterality Date  . ABDOMINAL ANGIOGRAM N/A 05/29/2014   Procedure: ABDOMINAL ANGIOGRAM;  Surgeon: Laverda Page, MD;  Location: [facility] CATH LAB;   Service: Cardiovascular;  Laterality: N/A;  . AMPUTATION Left 10/25/2015   Procedure: Left Foot 5th Ray Amputation;  Surgeon: Newt Minion, MD;  Location: [place];  Service: Orthopedics;  Laterality: Left;  . AMPUTATION Left 12/06/2015   Procedure: AMPUTATION BELOW KNEE;  Surgeon: Newt Minion, MD;  Location: [place];  Service: Orthopedics;  Laterality: Left;  . CARDIAC CATHETERIZATION     EF is 55-60% and no wall motion abnormalities (long long time ago)  . FEMORAL-POPLITEAL BYPASS GRAFT Left 10/24/2015   Procedure: BYPASS GRAFT FEMORAL below knee POPLITEAL ARTERY with Left Saphenous Vein;  Surgeon: Serafina Mitchell, MD;  Location: [place];  Service: Vascular;  Laterality: Left;  . FRACTURE SURGERY     left arm "many yrs ago"  . LOWER EXTREMITY ANGIOGRAM N/A 01/23/2014   Procedure: LOWER EXTREMITY ANGIOGRAM;  Surgeon: Laverda Page, MD;  Location: [facility] CATH LAB;  Service: Cardiovascular;  Laterality: N/A;  . LOWER EXTREMITY ANGIOGRAM N/A 07/31/2014   Procedure: LOWER EXTREMITY ANGIOGRAM;  Surgeon: Laverda Page, MD;  Location: [facility] CATH LAB;  Service: Cardiovascular;  Laterality: N/A;  . LOWER EXTREMITY ANGIOGRAM Left 10/11/2015   Procedure: Lower Extremity Angiogram;  Surgeon: Serafina Mitchell, MD;  Location: [place] CV LAB;  Service: Cardiovascular;  Laterality: Left;  . PERIPHERAL VASCULAR CATHETERIZATION Left 10/11/2015   Procedure: Peripheral Vascular Balloon Angioplasty;  Surgeon: Serafina Mitchell, MD;  Location: [place] CV LAB;  Service: Cardiovascular;  Laterality: Left;  sfa failed unable to cross occluded sfa  . PERIPHERAL VASCULAR CATHETERIZATION N/A 10/11/2015   Procedure: Abdominal Aortogram;  Surgeon: Serafina Mitchell, MD;  Location: [place] CV LAB;  Service: Cardiovascular;  Laterality: N/A;  . STUMP REVISION Left 03/02/2016   Procedure: Revision Left Below Knee Amputation;  Surgeon: Newt Minion, MD;  Location: [place];  Service: Orthopedics;  Laterality: Left;   Social History    Occupational History  . Not on file  Tobacco Use  . Smoking status: Current Every Day Smoker    Packs/day: 1.00    Years: 30.00    Pack years: 30.00    Types: Cigarettes  . Smokeless tobacco: Never Used  Substance and Sexual Activity  . Alcohol use: Yes    Comment: on occasion wine  . Drug use: No  . Sexual activity: Not on file

## 2019-10-02 ENCOUNTER — Ambulatory Visit: Payer: Medicare Other | Admitting: Orthopedic Surgery

## 2019-10-02 ENCOUNTER — Ambulatory Visit (INDEPENDENT_AMBULATORY_CARE_PROVIDER_SITE_OTHER): Payer: Medicare Other

## 2019-10-02 ENCOUNTER — Other Ambulatory Visit: Payer: Self-pay

## 2019-10-02 ENCOUNTER — Ambulatory Visit: Payer: Self-pay

## 2019-10-02 ENCOUNTER — Encounter: Payer: Self-pay | Admitting: Orthopedic Surgery

## 2019-10-02 VITALS — Ht 72.0 in | Wt 151.0 lb

## 2019-10-02 DIAGNOSIS — E1151 Type 2 diabetes mellitus with diabetic peripheral angiopathy without gangrene: Secondary | ICD-10-CM | POA: Diagnosis not present

## 2019-10-02 MED ORDER — DOXYCYCLINE HYCLATE 100 MG PO TABS: 100.0000 mg | ORAL_TABLET | Freq: Two times a day (BID) | ORAL | 0 refills | Status: DC

## 2019-10-02 NOTE — Progress Notes (Signed)
Office Visit Note   Patient: [name]           Date of Birth: [birth year]           MRN: [id number] Visit Date: 10/02/2019              Requested by: [name], [place],  [place] [address] PCP: [name], MD  Chief Complaint  Patient presents with  . Right Foot - Pain      HPI: This is a pleasant 69 year old gentleman with a history of a left below-knee amputation.  He comes in today because he noticed a small dark scab on the bottom at the tip of his great toe.  He also noticed his toe was slightly red and swollen  Assessment & Plan: Visit Diagnoses:  1. Diabetes mellitus with peripheral artery disease ([facility])     Plan: After obtaining verbal consent I debrided the very small area there was no foul odor no further drainage a Band-Aid with some Iodosorb was placed over the area.  He will begin doxycycline for a week follow-up at that time  Follow-Up Instructions: No follow-ups on file.   Ortho Exam  Patient is alert, oriented, no adenopathy, well-dressed, normal affect, normal respiratory effort. Focused exam of his great toe mild soft tissue swelling mild erythema at the plantar surface of the distal phalanx he has a small blood blister which was debrided initially just a spot of purulent drainage came out there was no probing deep no foul odor no surrounding fluctuance or erythema adjacent  Imaging: XR Toe Great Right  Result Date: 10/02/2019 2 views of his great toe were taken today.  There is no evidence of bony osseous changes or breakdown  No images are attached to the encounter.  Labs: Lab Results  Component Value Date   HGBA1C 9.1 (H) 10/24/2015     Lab Results  Component Value Date   ALBUMIN 3.5 10/22/2015   ALBUMIN 3.9 09/01/2014    No results found for: MG No results found for: VD25OH  No results found for: PREALBUMIN CBC EXTENDED Latest Ref Rng & Units 03/02/2016 12/17/2015 12/13/2015  WBC 4.0 - 10.5 K/uL 13.6(H) 5.5 7.6    RBC 4.22 - 5.81 MIL/uL 4.06(L) - -  HGB 13.0 - 17.0 g/dL 13.0 10.0(A) 9.8(A)  HCT 39.0 - 52.0 % 37.5(L) 30(A) 30(A)  PLT 150 - 400 K/uL 498(H) 508(A) 453(A)  NEUTROABS 1.7 - 7.7 K/uL 10.4(H) 2 -  LYMPHSABS 0.7 - 4.0 K/uL 1.6 - -     Body mass index is 20.48 kg/m.  Orders:  Orders Placed This Encounter  Procedures  . XR Toe Great Right   No orders of the defined types were placed in this encounter.    Procedures: No procedures performed  Clinical Data: No additional findings.  ROS:  All other systems negative, except as noted in the HPI. Review of Systems  Objective: Vital Signs: Ht 6' (1.829 m)   Wt 151 lb (68.5 kg)   BMI 20.48 kg/m   Specialty Comments:  No specialty comments available.  PMFS History: Patient Active Problem List   Diagnosis Date Noted  . Below knee amputation status 12/06/2015  . PAD (peripheral artery disease) ([facility]) 10/24/2015  . Diabetes mellitus with peripheral artery disease ([facility]) 07/30/2014  . Claudication in peripheral vascular disease ([facility]) 07/30/2014  . IDDM (insulin dependent diabetes mellitus) 01/23/2013  . CAD (coronary artery disease) 04/15/2011  . Hyperlipidemia 04/15/2011   Past  Medical History:  Diagnosis Date  . Cigarette smoker   . Coronary artery disease   . Diabetes mellitus    type 1  x 50 yrs  . Diabetic retinopathy ([facility])   . Dyslipidemia   . Peripheral vascular disease ([facility])     Family History  Problem Relation Age of Onset  . Coronary artery disease Mother     Past Surgical History:  Procedure Laterality Date  . ABDOMINAL ANGIOGRAM N/A 05/29/2014   Procedure: ABDOMINAL ANGIOGRAM;  Surgeon: Pamella Pert, MD;  Location: [facility] CATH LAB;  Service: Cardiovascular;  Laterality: N/A;  . AMPUTATION Left 10/25/2015   Procedure: Left Foot 5th Ray Amputation;  Surgeon: Nadara Mustard, MD;  Location: [facility] OR;  Service: Orthopedics;  Laterality: Left;  . AMPUTATION Left 12/06/2015   Procedure: AMPUTATION BELOW KNEE;   Surgeon: Nadara Mustard, MD;  Location: [facility] OR;  Service: Orthopedics;  Laterality: Left;  . CARDIAC CATHETERIZATION     EF is 55-60% and no wall motion abnormalities (long long time ago)  . FEMORAL-POPLITEAL BYPASS GRAFT Left 10/24/2015   Procedure: BYPASS GRAFT FEMORAL below knee POPLITEAL ARTERY with Left Saphenous Vein;  Surgeon: Nada Libman, MD;  Location: [facility] OR;  Service: Vascular;  Laterality: Left;  . FRACTURE SURGERY     left arm "many yrs ago"  . LOWER EXTREMITY ANGIOGRAM N/A 01/23/2014   Procedure: LOWER EXTREMITY ANGIOGRAM;  Surgeon: Pamella Pert, MD;  Location: [facility] CATH LAB;  Service: Cardiovascular;  Laterality: N/A;  . LOWER EXTREMITY ANGIOGRAM N/A 07/31/2014   Procedure: LOWER EXTREMITY ANGIOGRAM;  Surgeon: Pamella Pert, MD;  Location: [facility] CATH LAB;  Service: Cardiovascular;  Laterality: N/A;  . LOWER EXTREMITY ANGIOGRAM Left 10/11/2015   Procedure: Lower Extremity Angiogram;  Surgeon: Nada Libman, MD;  Location: [facility] INVASIVE CV LAB;  Service: Cardiovascular;  Laterality: Left;  . PERIPHERAL VASCULAR CATHETERIZATION Left 10/11/2015   Procedure: Peripheral Vascular Balloon Angioplasty;  Surgeon: Nada Libman, MD;  Location: [facility] INVASIVE CV LAB;  Service: Cardiovascular;  Laterality: Left;  sfa failed unable to cross occluded sfa  . PERIPHERAL VASCULAR CATHETERIZATION N/A 10/11/2015   Procedure: Abdominal Aortogram;  Surgeon: Nada Libman, MD;  Location: [facility] INVASIVE CV LAB;  Service: Cardiovascular;  Laterality: N/A;  . STUMP REVISION Left 03/02/2016   Procedure: Revision Left Below Knee Amputation;  Surgeon: Nadara Mustard, MD;  Location: [facility] OR;  Service: Orthopedics;  Laterality: Left;   Social History   Occupational History  . Not on file  Tobacco Use  . Smoking status: Current Every Day Smoker    Packs/day: 1.00    Years: 30.00    Pack years: 30.00    Types: Cigarettes  . Smokeless tobacco: Never Used  Substance and Sexual Activity  . Alcohol use: Yes     Comment: on occasion wine  . Drug use: No  . Sexual activity: Not on file

## 2019-10-13 ENCOUNTER — Other Ambulatory Visit: Payer: Self-pay

## 2019-10-13 ENCOUNTER — Ambulatory Visit: Payer: Medicare Other | Admitting: Physician Assistant

## 2019-10-13 ENCOUNTER — Encounter: Payer: Self-pay | Admitting: Physician Assistant

## 2019-10-13 VITALS — Ht 72.0 in | Wt 151.0 lb

## 2019-10-13 DIAGNOSIS — I739 Peripheral vascular disease, unspecified: Secondary | ICD-10-CM

## 2019-10-13 NOTE — Progress Notes (Signed)
Office Visit Note   Patient: [name]           Date of Birth: 1951-01-02           MRN: [id number] Visit Date: 10/13/2019              Requested by: [name], MD [address] [place],  [place] [address] PCP: [name], MD  Chief Complaint  Patient presents with  . Right Foot - Follow-up      HPI: This is a pleasant gentleman who follows up for the small ulcer on the plantar surface of his right great toe.  He has been applying a Band-Aid and antibiotic daily.  He has no other complaints  Assessment & Plan: Visit Diagnoses: No diagnosis found.  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  Of asked that he try to refrain from placing weight.  I do not see any infective process at this time.  Follow-up in 3 weeks sooner if he has any issues or concerns  Follow-Up Instructions: No follow-ups on file.   Ortho Exam  Patient is alert, oriented, no adenopathy, well-dressed, normal affect, normal respiratory effort. Focused examination of his right great toe demonstrates small thin blister.  I did partially remove this and got healthy brisk bleeding.  No purulent drainage no foul odor no surrounding erythema toe was not swollen  Imaging: No results found. No images are attached to the encounter.  Labs: Lab Results  Component Value Date   HGBA1C 9.1 (H) 10/24/2015     Lab Results  Component Value Date   ALBUMIN 3.5 10/22/2015   ALBUMIN 3.9 09/01/2014    No results found for: MG No results found for: VD25OH  No results found for: PREALBUMIN CBC EXTENDED Latest Ref Rng & Units 03/02/2016 12/17/2015 12/13/2015  WBC 4.0 - 10.5 K/uL 13.6(H) 5.5 7.6  RBC 4.22 - 5.81 MIL/uL 4.06(L) - -  HGB 13.0 - 17.0 g/dL 62.7 10.0(A) 9.8(A)  HCT 39.0 - 52.0 % 37.5(L) 30(A) 30(A)  PLT 150 - 400 K/uL 498(H) 508(A) 453(A)  NEUTROABS 1.7 - 7.7 K/uL 10.4(H) 2 -  LYMPHSABS 0.7 - 4.0 K/uL 1.6 - -      Body mass index is 20.48 kg/m.  Orders:  No orders of the defined types were placed in this encounter.  No orders of the defined types were placed in this encounter.    Procedures: No procedures performed  Clinical Data: No additional findings.  ROS:  All other systems negative, except as noted in the HPI. Review of Systems  Objective: Vital Signs: Ht 6' (1.829 m)   Wt 151 lb (68.5 kg)   BMI 20.48 kg/m   Specialty Comments:  No specialty comments available.  PMFS History: Patient Active Problem List   Diagnosis Date Noted  . Below knee amputation status 12/06/2015  . PAD (peripheral artery disease) ([facility]) 10/24/2015  . Diabetes mellitus with peripheral artery disease ([facility]) 07/30/2014  . Claudication in peripheral vascular disease ([facility]) 07/30/2014  . IDDM (insulin dependent diabetes mellitus) 01/23/2013  . CAD (coronary artery disease) 04/15/2011  . Hyperlipidemia 04/15/2011   Past Medical History:  Diagnosis Date  . Cigarette smoker   . Coronary artery disease   . Diabetes mellitus    type 1  x 50 yrs  . Diabetic retinopathy ([facility])   . Dyslipidemia   . Peripheral vascular disease ([facility])  Family History  Problem Relation Age of Onset  . Coronary artery disease Mother     Past Surgical History:  Procedure Laterality Date  . ABDOMINAL ANGIOGRAM N/A 05/29/2014   Procedure: ABDOMINAL ANGIOGRAM;  Surgeon: Laverda Page, MD;  Location: [facility] CATH LAB;  Service: Cardiovascular;  Laterality: N/A;  . AMPUTATION Left 10/25/2015   Procedure: Left Foot 5th Ray Amputation;  Surgeon: Newt Minion, MD;  Location: [place];  Service: Orthopedics;  Laterality: Left;  . AMPUTATION Left 12/06/2015   Procedure: AMPUTATION BELOW KNEE;  Surgeon: Newt Minion, MD;  Location: [place];  Service: Orthopedics;  Laterality: Left;  . CARDIAC CATHETERIZATION     EF is 55-60% and no wall motion abnormalities (long long time ago)  . FEMORAL-POPLITEAL BYPASS GRAFT Left 10/24/2015    Procedure: BYPASS GRAFT FEMORAL below knee POPLITEAL ARTERY with Left Saphenous Vein;  Surgeon: Serafina Mitchell, MD;  Location: [place];  Service: Vascular;  Laterality: Left;  . FRACTURE SURGERY     left arm "many yrs ago"  . LOWER EXTREMITY ANGIOGRAM N/A 01/23/2014   Procedure: LOWER EXTREMITY ANGIOGRAM;  Surgeon: Laverda Page, MD;  Location: [facility] CATH LAB;  Service: Cardiovascular;  Laterality: N/A;  . LOWER EXTREMITY ANGIOGRAM N/A 07/31/2014   Procedure: LOWER EXTREMITY ANGIOGRAM;  Surgeon: Laverda Page, MD;  Location: [facility] CATH LAB;  Service: Cardiovascular;  Laterality: N/A;  . LOWER EXTREMITY ANGIOGRAM Left 10/11/2015   Procedure: Lower Extremity Angiogram;  Surgeon: Serafina Mitchell, MD;  Location: [place] CV LAB;  Service: Cardiovascular;  Laterality: Left;  . PERIPHERAL VASCULAR CATHETERIZATION Left 10/11/2015   Procedure: Peripheral Vascular Balloon Angioplasty;  Surgeon: Serafina Mitchell, MD;  Location: [place] CV LAB;  Service: Cardiovascular;  Laterality: Left;  sfa failed unable to cross occluded sfa  . PERIPHERAL VASCULAR CATHETERIZATION N/A 10/11/2015   Procedure: Abdominal Aortogram;  Surgeon: Serafina Mitchell, MD;  Location: [place] CV LAB;  Service: Cardiovascular;  Laterality: N/A;  . STUMP REVISION Left 03/02/2016   Procedure: Revision Left Below Knee Amputation;  Surgeon: Newt Minion, MD;  Location: [place];  Service: Orthopedics;  Laterality: Left;   Social History   Occupational History  . Not on file  Tobacco Use  . Smoking status: Current Every Day Smoker    Packs/day: 1.00    Years: 30.00    Pack years: 30.00    Types: Cigarettes  . Smokeless tobacco: Never Used  Substance and Sexual Activity  . Alcohol use: Yes    Comment: on occasion wine  . Drug use: No  . Sexual activity: Not on file

## 2019-10-26 ENCOUNTER — Other Ambulatory Visit: Payer: Self-pay

## 2019-10-26 ENCOUNTER — Telehealth: Payer: Self-pay | Admitting: Orthopedic Surgery

## 2019-10-26 MED ORDER — SULFAMETHOXAZOLE-TRIMETHOPRIM 800-160 MG PO TABS: 1.0000 | ORAL_TABLET | Freq: Two times a day (BID) | ORAL | 0 refills | Status: DC

## 2019-10-26 NOTE — Telephone Encounter (Signed)
Per Denny Peon was to put in order for bactrim pt is in [facility] [employer] and this was called in per request. Message has also been sent to Erin to call pt's father.

## 2019-10-26 NOTE — Telephone Encounter (Signed)
Pts father called in requesting to speak with Chales Abrahams or Denny Peon. I told him they were unavailable and asked if I could take a note; herman declined and asked for a call back stating it has to do with the pt.    [phone]

## 2019-10-30 ENCOUNTER — Other Ambulatory Visit: Payer: Self-pay

## 2019-10-30 ENCOUNTER — Encounter: Payer: Self-pay | Admitting: Orthopedic Surgery

## 2019-10-30 ENCOUNTER — Ambulatory Visit: Payer: Medicare Other | Admitting: Orthopedic Surgery

## 2019-10-30 VITALS — Ht 72.0 in | Wt 152.0 lb

## 2019-10-30 DIAGNOSIS — I739 Peripheral vascular disease, unspecified: Secondary | ICD-10-CM | POA: Diagnosis not present

## 2019-10-30 NOTE — Progress Notes (Signed)
Office Visit Note   Patient: [name]           Date of Birth: 05/31/1951           MRN: [id number] Visit Date: 10/30/2019              Requested by: [name], MD [address] [place],  [place] [address] PCP: [name], MD  Chief Complaint  Patient presents with  . Right Foot - Follow-up      HPI: Patient presents in follow-up today for his plantar right great toe ulcer.  He continues to have difficulty healing this.  He is now on a second course of antibiotics.  He is followed by vascular surgery however because of Covid he has not been to see them in quite a while  Assessment & Plan: Visit Diagnoses: No diagnosis found.  Plan: Patient was seen today directly by Dr. Lajoyce Corners.  He has recommended the patient follow-up with his vascular surgeon to see if more effective blood flow could be achieved.  Follow-up in 4 weeks he will continue to apply a Band-Aid with Neosporin should try to off weight as much as possible  Follow-Up Instructions: No follow-ups on file.   Ortho Exam  Patient is alert, oriented, no adenopathy, well-dressed, normal affect, normal respiratory effort. Right great toe has some erythema with a plantar ulcer ischemic on the plantar surface of the distal phalanx.  Pulses with Doppler more decreased.  After verbal consent the ulcer was debrided to a healthy stable surface with some bleeding.  He will follow-up in 4 weeks  Imaging: No results found. No images are attached to the encounter.  Labs: Lab Results  Component Value Date   HGBA1C 9.1 (H) 10/24/2015     Lab Results  Component Value Date   ALBUMIN 3.5 10/22/2015   ALBUMIN 3.9 09/01/2014    No results found for: MG No results found for: VD25OH  No results found for: PREALBUMIN CBC EXTENDED Latest Ref Rng & Units 03/02/2016 12/17/2015 12/13/2015  WBC 4.0 - 10.5 K/uL 13.6(H) 5.5 7.6  RBC 4.22 - 5.81 MIL/uL 4.06(L) - -  HGB 13.0 - 17.0 g/dL 02.7 10.0(A) 9.8(A)  HCT 39.0 - 52.0 %  37.5(L) 30(A) 30(A)  PLT 150 - 400 K/uL 498(H) 508(A) 453(A)  NEUTROABS 1.7 - 7.7 K/uL 10.4(H) 2 -  LYMPHSABS 0.7 - 4.0 K/uL 1.6 - -     Body mass index is 20.61 kg/m.  Orders:  No orders of the defined types were placed in this encounter.  No orders of the defined types were placed in this encounter.    Procedures: No procedures performed  Clinical Data: No additional findings.  ROS:  All other systems negative, except as noted in the HPI. Review of Systems  Objective: Vital Signs: Ht 6' (1.829 m)   Wt 152 lb (68.9 kg)   BMI 20.61 kg/m   Specialty Comments:  No specialty comments available.  PMFS History: Patient Active Problem List   Diagnosis Date Noted  . Below knee amputation status 12/06/2015  . PAD (peripheral artery disease) ([facility]) 10/24/2015  . Diabetes mellitus with peripheral artery disease ([facility]) 07/30/2014  . Claudication in peripheral vascular disease ([facility]) 07/30/2014  . IDDM (insulin dependent diabetes mellitus) 01/23/2013  . CAD (coronary artery disease) 04/15/2011  . Hyperlipidemia 04/15/2011   Past Medical History:  Diagnosis Date  . Cigarette smoker   . Coronary artery disease   . Diabetes mellitus    type 1  x 50 yrs  . Diabetic retinopathy ([place])   . Dyslipidemia   . Peripheral vascular disease ([place])     Family History  Problem Relation Age of Onset  . Coronary artery disease Mother     Past Surgical History:  Procedure Laterality Date  . ABDOMINAL ANGIOGRAM N/A 05/29/2014   Procedure: ABDOMINAL ANGIOGRAM;  Surgeon: Laverda Page, MD;  Location: [facility] CATH LAB;  Service: Cardiovascular;  Laterality: N/A;  . AMPUTATION Left 10/25/2015   Procedure: Left Foot 5th Ray Amputation;  Surgeon: Newt Minion, MD;  Location: [place];  Service: Orthopedics;  Laterality: Left;  . AMPUTATION Left 12/06/2015   Procedure: AMPUTATION BELOW KNEE;  Surgeon: Newt Minion, MD;  Location: [place];  Service: Orthopedics;  Laterality: Left;  . CARDIAC  CATHETERIZATION     EF is 55-60% and no wall motion abnormalities (long long time ago)  . FEMORAL-POPLITEAL BYPASS GRAFT Left 10/24/2015   Procedure: BYPASS GRAFT FEMORAL below knee POPLITEAL ARTERY with Left Saphenous Vein;  Surgeon: Serafina Mitchell, MD;  Location: [place];  Service: Vascular;  Laterality: Left;  . FRACTURE SURGERY     left arm "many yrs ago"  . LOWER EXTREMITY ANGIOGRAM N/A 01/23/2014   Procedure: LOWER EXTREMITY ANGIOGRAM;  Surgeon: Laverda Page, MD;  Location: [facility] CATH LAB;  Service: Cardiovascular;  Laterality: N/A;  . LOWER EXTREMITY ANGIOGRAM N/A 07/31/2014   Procedure: LOWER EXTREMITY ANGIOGRAM;  Surgeon: Laverda Page, MD;  Location: [facility] CATH LAB;  Service: Cardiovascular;  Laterality: N/A;  . LOWER EXTREMITY ANGIOGRAM Left 10/11/2015   Procedure: Lower Extremity Angiogram;  Surgeon: Serafina Mitchell, MD;  Location: [place] CV LAB;  Service: Cardiovascular;  Laterality: Left;  . PERIPHERAL VASCULAR CATHETERIZATION Left 10/11/2015   Procedure: Peripheral Vascular Balloon Angioplasty;  Surgeon: Serafina Mitchell, MD;  Location: [facility] CV LAB;  Service: Cardiovascular;  Laterality: Left;  sfa failed unable to cross occluded sfa  . PERIPHERAL VASCULAR CATHETERIZATION N/A 10/11/2015   Procedure: Abdominal Aortogram;  Surgeon: Serafina Mitchell, MD;  Location: [place] CV LAB;  Service: Cardiovascular;  Laterality: N/A;  . STUMP REVISION Left 03/02/2016   Procedure: Revision Left Below Knee Amputation;  Surgeon: Newt Minion, MD;  Location: [place];  Service: Orthopedics;  Laterality: Left;   Social History   Occupational History  . Not on file  Tobacco Use  . Smoking status: Current Every Day Smoker    Packs/day: 1.00    Years: 30.00    Pack years: 30.00    Types: Cigarettes  . Smokeless tobacco: Never Used  Substance and Sexual Activity  . Alcohol use: Yes    Comment: on occasion wine  . Drug use: No  . Sexual activity: Not on file

## 2019-11-03 ENCOUNTER — Ambulatory Visit: Payer: Medicare Other | Admitting: Family

## 2019-11-06 ENCOUNTER — Ambulatory Visit: Payer: Medicare Other | Admitting: Orthopedic Surgery

## 2019-11-10 ENCOUNTER — Other Ambulatory Visit: Payer: Self-pay | Admitting: *Deleted

## 2019-11-10 ENCOUNTER — Telehealth (HOSPITAL_COMMUNITY): Payer: Self-pay

## 2019-11-10 DIAGNOSIS — I739 Peripheral vascular disease, unspecified: Secondary | ICD-10-CM

## 2019-11-10 NOTE — Telephone Encounter (Signed)

## 2019-11-13 ENCOUNTER — Ambulatory Visit (INDEPENDENT_AMBULATORY_CARE_PROVIDER_SITE_OTHER)
Admission: RE | Admit: 2019-11-13 | Discharge: 2019-11-13 | Disposition: A | Discharge status: Home / Self Care | Payer: Medicare Other | Source: Ambulatory Visit | Attending: Surgery | Admitting: Surgery

## 2019-11-13 ENCOUNTER — Other Ambulatory Visit: Payer: Self-pay

## 2019-11-13 ENCOUNTER — Other Ambulatory Visit (HOSPITAL_COMMUNITY)
Admission: RE | Admit: 2019-11-13 | Discharge: 2019-11-13 | Disposition: A | Discharge status: Home / Self Care | Payer: Medicare Other | Source: Ambulatory Visit | Attending: Surgery | Admitting: Surgery

## 2019-11-13 ENCOUNTER — Encounter: Payer: Self-pay | Admitting: Surgery

## 2019-11-13 ENCOUNTER — Ambulatory Visit: Payer: Medicare Other | Admitting: Surgery

## 2019-11-13 VITALS — BP 137/61 | HR 59 | Temp 98.4°F | Resp 20 | Ht 72.0 in | Wt 157.0 lb

## 2019-11-13 DIAGNOSIS — I7025 Atherosclerosis of native arteries of other extremities with ulceration: Secondary | ICD-10-CM

## 2019-11-13 DIAGNOSIS — I739 Peripheral vascular disease, unspecified: Secondary | ICD-10-CM | POA: Diagnosis not present

## 2019-11-13 LAB — SARS CORONAVIRUS 2 (TAT 6-24 HRS): SARS Coronavirus 2: NEGATIVE

## 2019-11-13 NOTE — H&P (View-Only) (Signed)
[facility]  Patient name: [name] MRN: [id number] DOB: August 23, 1951 Sex: male   REQUESTING PROVIDER:    Dr. Virgina Jock   REASON FOR CONSULT:    Right toe ulcer  HISTORY OF PRESENT ILLNESS:   [name] is a 69 y.o. male, who is referred today for evaluation of a right great toe ulcer which has been present for about 6 weeks.  He is been performing topical wound care without significant improvement.  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  The patient continues to smoke.  He takes a statin for hypercholesterolemia.  He is medically managed for hypertension.  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  He also suffers from coronary artery disease.  He has seen Dr. Einar Gip PAST MEDICAL HISTORY    Past Medical History:  Diagnosis Date  . Cigarette smoker   . Coronary artery disease   . Diabetes mellitus    type 1  x 50 yrs  . Diabetic retinopathy ([place])   . Dyslipidemia   . Hypertension   . Peripheral vascular disease ([place])      FAMILY HISTORY   Family History  Problem Relation Age of Onset  . Coronary artery disease Mother     SOCIAL HISTORY:   Social History   Socioeconomic History  . Marital status: Widowed    Spouse name: Not on file  . Number of children: 2  . Years of education: Not on file  . Highest education level: Not on file  Occupational History  . Not on file  Tobacco Use  . Smoking status: Current Every Day Smoker    Packs/day: 1.00    Years: 30.00    Pack years: 30.00    Types: Cigarettes  . Smokeless tobacco: Never Used  Substance and Sexual Activity  . Alcohol use: Yes    Comment: on occasion wine  . Drug use: No  . Sexual activity: Not on file  Other Topics Concern  . Not on file  Social History Narrative  . Not on file   Social Determinants of Health   Financial Resource  Strain:   . Difficulty of Paying Living Expenses:   Food Insecurity:   . Worried About Charity fundraiser in the Last Year:   . Arboriculturist in the Last Year:   Transportation Needs:   . Film/video editor (Medical):   Marland Kitchen Lack of Transportation (Non-Medical):   Physical Activity:   . Days of Exercise per Week:   . Minutes of Exercise per Session:   Stress:   . Feeling of Stress :   Social Connections:   . Frequency of Communication with Friends and Family:   . Frequency of Social Gatherings with Friends and Family:   . Attends Religious Services:   . Active Member of Clubs or Organizations:   . Attends Archivist Meetings:   Marland Kitchen Marital Status:   Intimate Partner Violence:   . Fear of Current or Ex-Partner:   . Emotionally Abused:   Marland Kitchen Physically Abused:   . Sexually Abused:     ALLERGIES:    Allergies  Allergen Reactions  . Simvastatin Other (See Comments)    MYALGIAS, MUSCLE WEAKNESS     CURRENT MEDICATIONS:    Current Outpatient Medications  Medication Sig Dispense Refill  . acetaminophen (TYLENOL) 500 MG tablet Take 1,000 mg by mouth 2 (two)  times daily as needed (pain).     Marland Kitchen amLODipine (NORVASC) 5 MG tablet TAKE 1 TABLET BY MOUTH  DAILY 90 tablet 3  . aspirin EC 81 MG tablet Take 243 mg by mouth daily.     . baclofen (LIORESAL) 20 MG tablet Take 20 mg by mouth daily as needed (for leg cramps). Reported on 12/05/2015  0  . calcium carbonate (TUMS - DOSED IN MG ELEMENTAL CALCIUM) 500 MG chewable tablet Chew 2 tablets by mouth every 6 (six) hours as needed for indigestion or heartburn.    . cilostazol (PLETAL) 100 MG tablet Take 1 tablet (100 mg total) by mouth 2 (two) times daily. (Patient taking differently: Take 100 mg by mouth daily. ) 60 tablet 6  . folic acid (FOLVITE) 800 MCG tablet Take 1,600 mcg by mouth daily.     Marland Kitchen HUMALOG 100 UNIT/ML injection PUMP    . metoprolol succinate (TOPROL-XL) 25 MG 24 hr tablet Take 1 tablet (25 mg total) by  mouth daily. 30 tablet 1  . Multiple Vitamins-Minerals (MULTIVITAMIN GUMMIES ADULT PO) Take 2 tablets by mouth daily.     Marland Kitchen NOVOLOG 100 UNIT/ML injection Inject into the skin continuous. Via insulin pump  0  . ramipril (ALTACE) 10 MG tablet Take 10 mg by mouth daily.      . rosuvastatin (CRESTOR) 10 MG tablet TAKE 1 TABLET BY MOUTH  DAILY 90 tablet 3   No current facility-administered medications for this visit.    REVIEW OF SYSTEMS:   [X]  denotes positive finding, [ ]  denotes negative finding Cardiac  Comments:  Chest pain or chest pressure:    Shortness of breath upon exertion:    Short of breath when lying flat:    Irregular heart rhythm:        Vascular    Pain in calf, thigh, or hip brought on by ambulation: x   Pain in feet at night that wakes you up from your sleep:     Blood clot in your veins:    Leg swelling:         Pulmonary    Oxygen at home:    Productive cough:     Wheezing:         Neurologic    Sudden weakness in arms or legs:     Sudden numbness in arms or legs:     Sudden onset of difficulty speaking or slurred speech:    Temporary loss of vision in one eye:     Problems with dizziness:         Gastrointestinal    Blood in stool:      Vomited blood:         Genitourinary    Burning when urinating:     Blood in urine:        Psychiatric    Major depression:         Hematologic    Bleeding problems:    Problems with blood clotting too easily:        Skin    Rashes or ulcers: x       Constitutional    Fever or chills:     PHYSICAL EXAM:   Vitals:   11/13/19 0954  BP: 137/61  Pulse: (!) 59  Resp: 20  Temp: 98.4 F (36.9 C)  SpO2: 97%  Weight: 157 lb (71.2 kg)  Height: 6' (1.829 m)    GENERAL: The patient is a well-nourished male, in no acute distress. The  vital signs are documented above. CARDIAC: There is a regular rate and rhythm.  VASCULAR: Nonpalpable pedal pulses. PULMONARY: Nonlabored respirations ABDOMEN: Soft and  non-tender with normal pitched bowel sounds.  MUSCULOSKELETAL: T left leg amputation NEUROLOGIC: No focal weakness or paresthesias are detected. SKIN: Right great toe ulcer PSYCHIATRIC: The patient has a normal affect.  STUDIES:   I have reviewed the following: PTA   254        1.67 monophasic      +---------+------------------+-----+----------+--------+  DP    254        1.67 monophasic      +---------+------------------+-----+----------+--------+  Great Toe38        0.25 Abnormal       +---------+------------------+-----+----------+--------+   ASSESSMENT and PLAN   Diabetic foot ulcer, right great toe in the setting of severe peripheral vascular disease: The patient's toe pressures 38 which is below the threshold to heal a wound.  The neck step is to proceed with angiography to evaluate blood flow to his right leg and see what his options for revascularization are.  Hopefully he will be a candidate for endovascular repair.  I am scheduling this for tomorrow due to the high concerns over limb loss.  The patient understands that he may require surgical revascularization.  All of his questions were answered today.  I also discussed the importance of smoking cessation.   Wells Mccartney Brucks, IV, MD, FACS [facility] [place] Tel [phone] Pager [phone] 

## 2019-11-13 NOTE — Progress Notes (Signed)
[facility] of [facility]  Patient name: [name] MRN: [id number] DOB: August 23, 1951 Sex: male   REQUESTING PROVIDER:    Dr. Virgina Jock   REASON FOR CONSULT:    Right toe ulcer  HISTORY OF PRESENT ILLNESS:   [name] is a 69 y.o. male, who is referred today for evaluation of a right great toe ulcer which has been present for about 6 weeks.  He is been performing topical wound care without significant improvement.  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  The patient continues to smoke.  He takes a statin for hypercholesterolemia.  He is medically managed for hypertension.  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  He also suffers from coronary artery disease.  He has seen Dr. Einar Gip PAST MEDICAL HISTORY    Past Medical History:  Diagnosis Date  . Cigarette smoker   . Coronary artery disease   . Diabetes mellitus    type 1  x 50 yrs  . Diabetic retinopathy ([place])   . Dyslipidemia   . Hypertension   . Peripheral vascular disease ([place])      FAMILY HISTORY   Family History  Problem Relation Age of Onset  . Coronary artery disease Mother     SOCIAL HISTORY:   Social History   Socioeconomic History  . Marital status: Widowed    Spouse name: Not on file  . Number of children: 2  . Years of education: Not on file  . Highest education level: Not on file  Occupational History  . Not on file  Tobacco Use  . Smoking status: Current Every Day Smoker    Packs/day: 1.00    Years: 30.00    Pack years: 30.00    Types: Cigarettes  . Smokeless tobacco: Never Used  Substance and Sexual Activity  . Alcohol use: Yes    Comment: on occasion wine  . Drug use: No  . Sexual activity: Not on file  Other Topics Concern  . Not on file  Social History Narrative  . Not on file   Social Determinants of Health   Financial Resource  Strain:   . Difficulty of Paying Living Expenses:   Food Insecurity:   . Worried About Charity fundraiser in the Last Year:   . Arboriculturist in the Last Year:   Transportation Needs:   . Film/video editor (Medical):   Marland Kitchen Lack of Transportation (Non-Medical):   Physical Activity:   . Days of Exercise per Week:   . Minutes of Exercise per Session:   Stress:   . Feeling of Stress :   Social Connections:   . Frequency of Communication with Friends and Family:   . Frequency of Social Gatherings with Friends and Family:   . Attends Religious Services:   . Active Member of Clubs or Organizations:   . Attends Archivist Meetings:   Marland Kitchen Marital Status:   Intimate Partner Violence:   . Fear of Current or Ex-Partner:   . Emotionally Abused:   Marland Kitchen Physically Abused:   . Sexually Abused:     ALLERGIES:    Allergies  Allergen Reactions  . Simvastatin Other (See Comments)    MYALGIAS, MUSCLE WEAKNESS     CURRENT MEDICATIONS:    Current Outpatient Medications  Medication Sig Dispense Refill  . acetaminophen (TYLENOL) 500 MG tablet Take 1,000 mg by mouth 2 (two)  times daily as needed (pain).     Marland Kitchen amLODipine (NORVASC) 5 MG tablet TAKE 1 TABLET BY MOUTH  DAILY 90 tablet 3  . aspirin EC 81 MG tablet Take 243 mg by mouth daily.     . baclofen (LIORESAL) 20 MG tablet Take 20 mg by mouth daily as needed (for leg cramps). Reported on 12/05/2015  0  . calcium carbonate (TUMS - DOSED IN MG ELEMENTAL CALCIUM) 500 MG chewable tablet Chew 2 tablets by mouth every 6 (six) hours as needed for indigestion or heartburn.    . cilostazol (PLETAL) 100 MG tablet Take 1 tablet (100 mg total) by mouth 2 (two) times daily. (Patient taking differently: Take 100 mg by mouth daily. ) 60 tablet 6  . folic acid (FOLVITE) 800 MCG tablet Take 1,600 mcg by mouth daily.     Marland Kitchen HUMALOG 100 UNIT/ML injection PUMP    . metoprolol succinate (TOPROL-XL) 25 MG 24 hr tablet Take 1 tablet (25 mg total) by  mouth daily. 30 tablet 1  . Multiple Vitamins-Minerals (MULTIVITAMIN GUMMIES ADULT PO) Take 2 tablets by mouth daily.     Marland Kitchen NOVOLOG 100 UNIT/ML injection Inject into the skin continuous. Via insulin pump  0  . ramipril (ALTACE) 10 MG tablet Take 10 mg by mouth daily.      . rosuvastatin (CRESTOR) 10 MG tablet TAKE 1 TABLET BY MOUTH  DAILY 90 tablet 3   No current facility-administered medications for this visit.    REVIEW OF SYSTEMS:   [X]  denotes positive finding, [ ]  denotes negative finding Cardiac  Comments:  Chest pain or chest pressure:    Shortness of breath upon exertion:    Short of breath when lying flat:    Irregular heart rhythm:        Vascular    Pain in calf, thigh, or hip brought on by ambulation: x   Pain in feet at night that wakes you up from your sleep:     Blood clot in your veins:    Leg swelling:         Pulmonary    Oxygen at home:    Productive cough:     Wheezing:         Neurologic    Sudden weakness in arms or legs:     Sudden numbness in arms or legs:     Sudden onset of difficulty speaking or slurred speech:    Temporary loss of vision in one eye:     Problems with dizziness:         Gastrointestinal    Blood in stool:      Vomited blood:         Genitourinary    Burning when urinating:     Blood in urine:        Psychiatric    Major depression:         Hematologic    Bleeding problems:    Problems with blood clotting too easily:        Skin    Rashes or ulcers: x       Constitutional    Fever or chills:     PHYSICAL EXAM:   Vitals:   11/13/19 0954  BP: 137/61  Pulse: (!) 59  Resp: 20  Temp: 98.4 F (36.9 C)  SpO2: 97%  Weight: 157 lb (71.2 kg)  Height: 6' (1.829 m)    GENERAL: The patient is a well-nourished male, in no acute distress. The  vital signs are documented above. CARDIAC: There is a regular rate and rhythm.  VASCULAR: Nonpalpable pedal pulses. PULMONARY: Nonlabored respirations ABDOMEN: Soft and  non-tender with normal pitched bowel sounds.  MUSCULOSKELETAL: T left leg amputation NEUROLOGIC: No focal weakness or paresthesias are detected. SKIN: Right great toe ulcer PSYCHIATRIC: The patient has a normal affect.  STUDIES:   I have reviewed the following: PTA   254        1.67 monophasic      +---------+------------------+-----+----------+--------+  DP    254        1.67 monophasic      +---------+------------------+-----+----------+--------+  Great Toe38        0.25 Abnormal       +---------+------------------+-----+----------+--------+   ASSESSMENT and PLAN   Diabetic foot ulcer, right great toe in the setting of severe peripheral vascular disease: The patient's toe pressures 38 which is below the threshold to heal a wound.  The neck step is to proceed with angiography to evaluate blood flow to his right leg and see what his options for revascularization are.  Hopefully he will be a candidate for endovascular repair.  I am scheduling this for tomorrow due to the high concerns over limb loss.  The patient understands that he may require surgical revascularization.  All of his questions were answered today.  I also discussed the importance of smoking cessation.   Charlena Cross, MD, FACS [facility] [phone] Pager [phone]

## 2019-11-14 ENCOUNTER — Other Ambulatory Visit: Payer: Self-pay

## 2019-11-14 ENCOUNTER — Inpatient Hospital Stay (HOSPITAL_COMMUNITY)
Admission: RE | Admit: 2019-11-14 | Discharge: 2019-11-17 | DRG: 272 | Disposition: A | Discharge status: Home Health | Payer: Medicare Other | Attending: Surgery | Admitting: Surgery

## 2019-11-14 ENCOUNTER — Encounter (HOSPITAL_COMMUNITY)
Admission: RE | Disposition: A | Discharge status: Home / Self Care | Payer: Self-pay | Source: Ambulatory Visit | Attending: Surgery

## 2019-11-14 ENCOUNTER — Encounter (HOSPITAL_COMMUNITY): Payer: Self-pay | Admitting: Surgery

## 2019-11-14 DIAGNOSIS — Z20822 Contact with and (suspected) exposure to covid-19: Secondary | ICD-10-CM | POA: Diagnosis present

## 2019-11-14 DIAGNOSIS — E785 Hyperlipidemia, unspecified: Secondary | ICD-10-CM | POA: Diagnosis present

## 2019-11-14 DIAGNOSIS — Z794 Long term (current) use of insulin: Secondary | ICD-10-CM

## 2019-11-14 DIAGNOSIS — Z79899 Other long term (current) drug therapy: Secondary | ICD-10-CM

## 2019-11-14 DIAGNOSIS — Z7982 Long term (current) use of aspirin: Secondary | ICD-10-CM

## 2019-11-14 DIAGNOSIS — L97519 Non-pressure chronic ulcer of other part of right foot with unspecified severity: Secondary | ICD-10-CM | POA: Diagnosis present

## 2019-11-14 DIAGNOSIS — E1051 Type 1 diabetes mellitus with diabetic peripheral angiopathy without gangrene: Principal | ICD-10-CM | POA: Diagnosis present

## 2019-11-14 DIAGNOSIS — I10 Essential (primary) hypertension: Secondary | ICD-10-CM | POA: Diagnosis present

## 2019-11-14 DIAGNOSIS — I70201 Unspecified atherosclerosis of native arteries of extremities, right leg: Secondary | ICD-10-CM | POA: Diagnosis present

## 2019-11-14 DIAGNOSIS — I70235 Atherosclerosis of native arteries of right leg with ulceration of other part of foot: Secondary | ICD-10-CM

## 2019-11-14 DIAGNOSIS — J449 Chronic obstructive pulmonary disease, unspecified: Secondary | ICD-10-CM | POA: Diagnosis present

## 2019-11-14 DIAGNOSIS — I252 Old myocardial infarction: Secondary | ICD-10-CM

## 2019-11-14 DIAGNOSIS — I739 Peripheral vascular disease, unspecified: Secondary | ICD-10-CM | POA: Diagnosis present

## 2019-11-14 DIAGNOSIS — E10319 Type 1 diabetes mellitus with unspecified diabetic retinopathy without macular edema: Secondary | ICD-10-CM | POA: Diagnosis present

## 2019-11-14 DIAGNOSIS — E7800 Pure hypercholesterolemia, unspecified: Secondary | ICD-10-CM | POA: Diagnosis present

## 2019-11-14 DIAGNOSIS — F17210 Nicotine dependence, cigarettes, uncomplicated: Secondary | ICD-10-CM | POA: Diagnosis present

## 2019-11-14 DIAGNOSIS — I2510 Atherosclerotic heart disease of native coronary artery without angina pectoris: Secondary | ICD-10-CM | POA: Diagnosis present

## 2019-11-14 DIAGNOSIS — E10621 Type 1 diabetes mellitus with foot ulcer: Secondary | ICD-10-CM | POA: Diagnosis present

## 2019-11-14 DIAGNOSIS — Z89512 Acquired absence of left leg below knee: Secondary | ICD-10-CM

## 2019-11-14 HISTORY — PX: PERIPHERAL VASCULAR BALLOON ANGIOPLASTY: CATH118281

## 2019-11-14 HISTORY — PX: ABDOMINAL AORTOGRAM W/LOWER EXTREMITY: CATH118223

## 2019-11-14 LAB — HEMOGLOBIN A1C
Hgb A1c MFr Bld: 8 % — ABNORMAL HIGH (ref 4.8–5.6)
Mean Plasma Glucose: 182.9 mg/dL

## 2019-11-14 LAB — BASIC METABOLIC PANEL
Anion gap: 9 (ref 5–15)
BUN: 16 mg/dL (ref 8–23)
CO2: 24 mmol/L (ref 22–32)
Calcium: 9 mg/dL (ref 8.9–10.3)
Chloride: 102 mmol/L (ref 98–111)
Creatinine, Ser: 1.01 mg/dL (ref 0.61–1.24)
GFR calc Af Amer: >60 mL/min (ref >60)
GFR calc non Af Amer: >60 mL/min (ref >60)
Glucose, Bld: 125 mg/dL — ABNORMAL HIGH (ref 70–99)
Potassium: 5 mmol/L (ref 3.5–5.1)
Sodium: 135 mmol/L (ref 135–145)

## 2019-11-14 LAB — GLUCOSE, CAPILLARY
Glucose-Capillary: 121 mg/dL — ABNORMAL HIGH (ref 70–99)
Glucose-Capillary: 39 mg/dL — CL (ref 70–99)
Glucose-Capillary: 141 mg/dL — ABNORMAL HIGH (ref 70–99)
Glucose-Capillary: 269 mg/dL — ABNORMAL HIGH (ref 70–99)

## 2019-11-14 LAB — POCT ACTIVATED CLOTTING TIME: Activated Clotting Time: 219 seconds

## 2019-11-14 SURGERY — ABDOMINAL AORTOGRAM W/LOWER EXTREMITY
Anesthesia: LOCAL

## 2019-11-14 MED ORDER — MORPHINE SULFATE (PF) 10 MG/ML IV SOLN: 2.0000 mg | INTRAVENOUS | Status: DC | PRN

## 2019-11-14 MED ORDER — MULTIVITAMIN GUMMIES ADULT PO CHEW: CHEWABLE_TABLET | Freq: Every day | ORAL | Status: DC

## 2019-11-14 MED ORDER — LIDOCAINE HCL (PF) 1 % IJ SOLN
INTRAMUSCULAR | Status: AC
  Filled 2019-11-14: qty 30

## 2019-11-14 MED ORDER — ACETAMINOPHEN 500 MG PO TABS: 1000.0000 mg | ORAL_TABLET | Freq: Two times a day (BID) | ORAL | Status: DC | PRN

## 2019-11-14 MED ORDER — METOPROLOL SUCCINATE ER 25 MG PO TB24
25.0000 mg | ORAL_TABLET | Freq: Every day | ORAL | Status: DC
  Administered 2019-11-14: 25 mg via ORAL
  Filled 2019-11-14: qty 1

## 2019-11-14 MED ORDER — DEXTROSE 50 % IV SOLN
INTRAVENOUS | Status: AC
  Filled 2019-11-14: qty 50

## 2019-11-14 MED ORDER — SODIUM CHLORIDE 0.9 % WEIGHT BASED INFUSION: 1.0000 mL/kg/h | INTRAVENOUS | Status: AC

## 2019-11-14 MED ORDER — ACETAMINOPHEN 325 MG PO TABS: 650.0000 mg | ORAL_TABLET | ORAL | Status: DC | PRN

## 2019-11-14 MED ORDER — ASPIRIN EC 81 MG PO TBEC: 243.0000 mg | DELAYED_RELEASE_TABLET | Freq: Every day | ORAL | Status: DC

## 2019-11-14 MED ORDER — HEPARIN (PORCINE) IN NACL 1000-0.9 UT/500ML-% IV SOLN
INTRAVENOUS | Status: AC
  Filled 2019-11-14: qty 500

## 2019-11-14 MED ORDER — MORPHINE SULFATE (PF) 2 MG/ML IV SOLN: 2.0000 mg | INTRAVENOUS | Status: DC | PRN

## 2019-11-14 MED ORDER — INSULIN ASPART 100 UNIT/ML ~~LOC~~ SOLN
0.0000 [IU] | Freq: Three times a day (TID) | SUBCUTANEOUS | Status: DC
  Administered 2019-11-15: 5 [IU] via SUBCUTANEOUS
  Filled 2019-11-14: qty 1

## 2019-11-14 MED ORDER — MIDAZOLAM HCL 2 MG/2ML IJ SOLN
INTRAMUSCULAR | Status: DC | PRN
  Administered 2019-11-14 (×2): 2 mg via INTRAVENOUS

## 2019-11-14 MED ORDER — FOLIC ACID 1 MG PO TABS
1.0000 mg | ORAL_TABLET | Freq: Every day | ORAL | Status: DC
  Administered 2019-11-14 – 2019-11-16 (×2): 1 mg via ORAL
  Filled 2019-11-14 (×2): qty 1

## 2019-11-14 MED ORDER — IODIXANOL 320 MG/ML IV SOLN
INTRAVENOUS | Status: DC | PRN
  Administered 2019-11-14: 100 mL via INTRA_ARTERIAL

## 2019-11-14 MED ORDER — SODIUM CHLORIDE 0.9% FLUSH: 3.0000 mL | Freq: Two times a day (BID) | INTRAVENOUS | Status: DC

## 2019-11-14 MED ORDER — ONDANSETRON HCL 4 MG/2ML IJ SOLN: 4.0000 mg | Freq: Four times a day (QID) | INTRAMUSCULAR | Status: DC | PRN

## 2019-11-14 MED ORDER — ASPIRIN EC 81 MG PO TBEC
81.0000 mg | DELAYED_RELEASE_TABLET | Freq: Every day | ORAL | Status: DC
  Administered 2019-11-14 – 2019-11-16 (×2): 81 mg via ORAL
  Filled 2019-11-14 (×2): qty 1

## 2019-11-14 MED ORDER — SODIUM CHLORIDE 0.9% FLUSH: 3.0000 mL | INTRAVENOUS | Status: DC | PRN

## 2019-11-14 MED ORDER — OXYCODONE HCL 5 MG PO TABS
5.0000 mg | ORAL_TABLET | ORAL | Status: DC | PRN
  Administered 2019-11-15 – 2019-11-16 (×3): 10 mg via ORAL
  Filled 2019-11-14 (×3): qty 2

## 2019-11-14 MED ORDER — RAMIPRIL 5 MG PO CAPS
10.0000 mg | ORAL_CAPSULE | Freq: Every day | ORAL | Status: DC
  Administered 2019-11-14 – 2019-11-16 (×2): 10 mg via ORAL
  Filled 2019-11-14 (×2): qty 2

## 2019-11-14 MED ORDER — INSULIN PUMP
Freq: Three times a day (TID) | SUBCUTANEOUS | Status: DC
  Filled 2019-11-14: qty 1

## 2019-11-14 MED ORDER — LABETALOL HCL 5 MG/ML IV SOLN: 10.0000 mg | INTRAVENOUS | Status: DC | PRN

## 2019-11-14 MED ORDER — FENTANYL CITRATE (PF) 100 MCG/2ML IJ SOLN
INTRAMUSCULAR | Status: AC
  Filled 2019-11-14: qty 2

## 2019-11-14 MED ORDER — LIDOCAINE HCL (PF) 1 % IJ SOLN
INTRAMUSCULAR | Status: DC | PRN
  Administered 2019-11-14: 15 mL via INTRADERMAL

## 2019-11-14 MED ORDER — AMLODIPINE BESYLATE 5 MG PO TABS
5.0000 mg | ORAL_TABLET | Freq: Every day | ORAL | Status: DC
  Administered 2019-11-14 – 2019-11-16 (×2): 5 mg via ORAL
  Filled 2019-11-14 (×2): qty 1

## 2019-11-14 MED ORDER — CEFAZOLIN SODIUM-DEXTROSE 1-4 GM/50ML-% IV SOLN: 1.0000 g | INTRAVENOUS | Status: DC

## 2019-11-14 MED ORDER — HEPARIN SODIUM (PORCINE) 1000 UNIT/ML IJ SOLN
INTRAMUSCULAR | Status: AC
  Filled 2019-11-14: qty 1

## 2019-11-14 MED ORDER — SODIUM CHLORIDE 0.9 % IV SOLN: INTRAVENOUS | Status: DC

## 2019-11-14 MED ORDER — SODIUM CHLORIDE 0.9 % IV SOLN: 250.0000 mL | INTRAVENOUS | Status: DC | PRN

## 2019-11-14 MED ORDER — ROSUVASTATIN CALCIUM 5 MG PO TABS
10.0000 mg | ORAL_TABLET | Freq: Every day | ORAL | Status: DC
  Administered 2019-11-14 – 2019-11-16 (×2): 10 mg via ORAL
  Filled 2019-11-14 (×2): qty 2

## 2019-11-14 MED ORDER — BACLOFEN 20 MG PO TABS
20.0000 mg | ORAL_TABLET | Freq: Every day | ORAL | Status: DC | PRN
  Filled 2019-11-14: qty 1

## 2019-11-14 MED ORDER — HYDRALAZINE HCL 20 MG/ML IJ SOLN: 5.0000 mg | INTRAMUSCULAR | Status: DC | PRN

## 2019-11-14 MED ORDER — MIDAZOLAM HCL 2 MG/2ML IJ SOLN
INTRAMUSCULAR | Status: AC
  Filled 2019-11-14: qty 2

## 2019-11-14 MED ORDER — HEPARIN SODIUM (PORCINE) 1000 UNIT/ML IJ SOLN
INTRAMUSCULAR | Status: DC | PRN
  Administered 2019-11-14: 7000 [IU] via INTRAVENOUS

## 2019-11-14 MED ORDER — HEPARIN (PORCINE) IN NACL 1000-0.9 UT/500ML-% IV SOLN
INTRAVENOUS | Status: DC | PRN
  Administered 2019-11-14 (×2): 500 mL

## 2019-11-14 MED ORDER — FENTANYL CITRATE (PF) 100 MCG/2ML IJ SOLN
INTRAMUSCULAR | Status: DC | PRN
  Administered 2019-11-14 (×2): 50 ug via INTRAVENOUS

## 2019-11-14 MED ORDER — INSULIN ASPART 100 UNIT/ML ~~LOC~~ SOLN
4.0000 [IU] | Freq: Once | SUBCUTANEOUS | Status: AC
  Administered 2019-11-14: 4 [IU] via SUBCUTANEOUS

## 2019-11-14 MED ORDER — CALCIUM CARBONATE ANTACID 500 MG PO CHEW: 2.0000 | CHEWABLE_TABLET | Freq: Four times a day (QID) | ORAL | Status: DC | PRN

## 2019-11-14 SURGICAL SUPPLY — 22 items
BAG SNAP BAND KOVER 36X36 (MISCELLANEOUS) ×1 IMPLANT
CATH OMNI FLUSH 5F 65CM (CATHETERS) ×1 IMPLANT
CATH QUICKCROSS SUPP .035X90CM (MICROCATHETER) ×1 IMPLANT
CATH SOFT-VU 4F 65 STRAIGHT (CATHETERS) IMPLANT
CATH SOFT-VU STRAIGHT 4F 65CM (CATHETERS) ×3
CLOSURE MYNX CONTROL 6F/7F (Vascular Products) ×1 IMPLANT
COVER DOME SNAP 22 D (MISCELLANEOUS) ×2 IMPLANT
GLIDEWIRE ADV .035X260CM (WIRE) ×1 IMPLANT
GUIDEWIRE ANGLED .035X150CM (WIRE) ×1 IMPLANT
KIT MICROPUNCTURE NIT STIFF (SHEATH) ×1 IMPLANT
KIT PV (KITS) ×3 IMPLANT
SHEATH PINNACLE 5F 10CM (SHEATH) ×1 IMPLANT
SHEATH PINNACLE 7F 10CM (SHEATH) ×1 IMPLANT
SHEATH PINNACLE MP 7F 45CM (SHEATH) ×1 IMPLANT
SHIELD RADPAD SCOOP 12X17 (MISCELLANEOUS) ×1 IMPLANT
SYR MEDRAD MARK V 150ML (SYRINGE) ×1 IMPLANT
TAPE VIPERTRACK RADIOPAQ (MISCELLANEOUS) IMPLANT
TAPE VIPERTRACK RADIOPAQUE (MISCELLANEOUS) ×3
TRANSDUCER W/STOPCOCK (MISCELLANEOUS) ×3 IMPLANT
TRAY PV CATH (CUSTOM PROCEDURE TRAY) ×3 IMPLANT
WIRE BENTSON .035X145CM (WIRE) ×1 IMPLANT
WIRE ROSEN-J .035X260CM (WIRE) ×1 IMPLANT

## 2019-11-14 NOTE — Interval H&P Note (Signed)
History and Physical Interval Note:  11/14/2019 12:50 PM  [name]  has presented today for surgery, with the diagnosis of ulcer.  The various methods of treatment have been discussed with the patient and family. After consideration of risks, benefits and other options for treatment, the patient has consented to  Procedure(s): ABDOMINAL AORTOGRAM W/LOWER EXTREMITY (N/A) as a surgical intervention.  The patient's history has been reviewed, patient examined, no change in status, stable for surgery.  I have reviewed the patient's chart and labs.  Questions were answered to the patient's satisfaction.     Durene Cal

## 2019-11-14 NOTE — Op Note (Signed)
Patient name: [name] MRN: [id number] DOB: 23-Aug-1951 Sex: male  11/14/2019 Pre-operative Diagnosis: Right leg ulcer Post-operative diagnosis:  Same Surgeon:  Durene Cal Procedure Performed:  1.  Ultrasound-guided access, left femoral artery  2.  Abdominal aortogram  3.  Right lower extremity runoff  4.  Third order catheterization  5.  Failed angioplasty, right superficial femoral artery  6.  Conscious sedation 61 minutes  7.  Closure device, Mynx (failed)   Indications: This is a 69 year old gentleman who is status post left leg amputation.  He has a 6-week history of ulcer on his right great toe.  He comes in today for angiography.  Procedure:  The patient was identified in the holding area and taken to room 8.  The patient was then placed supine on the table and prepped and draped in the usual sterile fashion.  A time out was called.  Conscious sedation was administered with the use of IV fentanyl and Versed under continuous physician and nurse monitoring.  Heart rate, blood pressure, and oxygen saturation were continuously monitored.  Total sedation time was 61 minutes.  Ultrasound was used to evaluate the left common femoral artery.  It was patent .  A digital ultrasound image was acquired.  A micropuncture needle was used to access the left common femoral artery under ultrasound guidance.  An 018 wire was advanced without resistance and a micropuncture sheath was placed.  The 018 wire was removed and a benson wire was placed.  The micropuncture sheath was exchanged for a 5 french sheath.  An omniflush catheter was advanced over the wire to the level of L-1.  An abdominal angiogram was obtained.  Next, using the omniflush catheter and a benson wire, the aortic bifurcation was crossed and the catheter was placed into theright external iliac artery and right runoff was obtained.   Findings:   Aortogram: No significant renal artery stenosis was identified.  The infrarenal  abdominal aorta body pain.  Bilateral common and external iliac arteries widely patent.  Right Lower Extremity: The right common femoral artery is calcified but patent.  There is diffuse disease at the origin of the profunda femoral artery as well as its proximal branches.  The superficial femoral artery is occluded at its origin with reconstitution at the level of the patella.  The popliteal artery is patent throughout its course.  There is three-vessel runoff to the ankle.  There is diffuse atherosclerotic vascular changes out on the foot.   Left Lower Extremity: Not evaluated (amputation)  Intervention: After the above images were acquired the decision made to proceed with intervention.  A 7 French 45 cm sheath was placed in the right external iliac artery.  Patient was fully heparinized.  Using a 035 Glidewire and a quick cross catheter, the superficial femoral artery was selected.  Subintimal recanalization was performed.  I was able to get the catheter down to the popliteal space however I could not reenter.  After multiple failed attempts, I elected to abort and plan on surgical revascularization. 7 French sheath was exchanged out for 7 short sheath.  A minx device was used for closure however the balloon burst and so manual pressure was held.  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    Juleen China, M.D., FACS [facility] [place] Office: [phone] Pager:  [phone] 

## 2019-11-14 NOTE — Anesthesia Preprocedure Evaluation (Addendum)
Anesthesia Evaluation  Patient identified by MRN, date of birth, ID band Patient awake    Reviewed: Allergy & Precautions, NPO status , Patient's Chart, lab work & pertinent test results  Airway Mallampati: II  TM Distance: >3 FB Neck ROM: Full    Dental no notable dental hx. (+) Teeth Intact, Dental Advisory Given   Pulmonary COPD, Current Smoker and Patient abstained from smoking.,    Pulmonary exam normal breath sounds clear to auscultation       Cardiovascular hypertension, Pt. on medications and Pt. on home beta blockers + CAD, + Past MI and + Peripheral Vascular Disease  Normal cardiovascular exam Rhythm:Regular Rate:Normal     Neuro/Psych negative neurological ROS     GI/Hepatic negative GI ROS, Neg liver ROS,   Endo/Other  diabetes, Poorly Controlled, Type 2, Insulin DependentHgb A1C 8.0  Renal/GU K+ 5.0 Cr 1.01     Musculoskeletal   Abdominal   Peds  Hematology Hgb 13.6   Anesthesia Other Findings   Reproductive/Obstetrics                           Anesthesia Physical Anesthesia Plan  ASA: III  Anesthesia Plan: General   Post-op Pain Management:    Induction: Intravenous  PONV Risk Score and Plan: 3 and Treatment may vary due to age or medical condition, Ondansetron, Dexamethasone and Midazolam  Airway Management Planned: Oral ETT  Additional Equipment: None  Intra-op Plan:   Post-operative Plan: Extubation in OR  Informed Consent:     Dental advisory given  Plan Discussed with:   Anesthesia Plan Comments: (2 x 18g IV)        Anesthesia Quick Evaluation

## 2019-11-14 NOTE — Progress Notes (Signed)
CBG 39, as this RN went to administer D5, patient refused as he was eating lunch. States he does not want to take off insulin pump. RN initiated insulin pump therapy order set, as patient was ready to sign contract, he agreed to remove his pump and let nursing manage his insulin and CBGs with our prior orders. Will recheck CBG after patient eats.

## 2019-11-15 ENCOUNTER — Ambulatory Visit (HOSPITAL_COMMUNITY): Payer: Medicare Other | Admitting: Anesthesiology

## 2019-11-15 ENCOUNTER — Encounter (HOSPITAL_COMMUNITY)
Admission: RE | Disposition: A | Discharge status: Home / Self Care | Payer: Self-pay | Source: Ambulatory Visit | Attending: Surgery

## 2019-11-15 DIAGNOSIS — I70235 Atherosclerosis of native arteries of right leg with ulceration of other part of foot: Secondary | ICD-10-CM | POA: Diagnosis not present

## 2019-11-15 HISTORY — PX: FEMORAL-POPLITEAL BYPASS GRAFT: SHX937

## 2019-11-15 LAB — BASIC METABOLIC PANEL
Anion gap: 7 (ref 5–15)
BUN: 21 mg/dL (ref 8–23)
CO2: 22 mmol/L (ref 22–32)
Calcium: 8.4 mg/dL — ABNORMAL LOW (ref 8.9–10.3)
Chloride: 105 mmol/L (ref 98–111)
Creatinine, Ser: 1 mg/dL (ref 0.61–1.24)
GFR calc Af Amer: >60 mL/min (ref >60)
GFR calc non Af Amer: >60 mL/min (ref >60)
Glucose, Bld: 257 mg/dL — ABNORMAL HIGH (ref 70–99)
Potassium: 4.9 mmol/L (ref 3.5–5.1)
Sodium: 134 mmol/L — ABNORMAL LOW (ref 135–145)

## 2019-11-15 LAB — POCT I-STAT, CHEM 8
BUN: 23 mg/dL (ref 8–23)
Calcium, Ion: 1.14 mmol/L — ABNORMAL LOW (ref 1.15–1.40)
Chloride: 105 mmol/L (ref 98–111)
Creatinine, Ser: 1.1 mg/dL (ref 0.61–1.24)
Glucose, Bld: 119 mg/dL — ABNORMAL HIGH (ref 70–99)
HCT: 40 % (ref 39.0–52.0)
Hemoglobin: 13.6 g/dL (ref 13.0–17.0)
Potassium: 7.8 mmol/L (ref 3.5–5.1)
Sodium: 133 mmol/L — ABNORMAL LOW (ref 135–145)
TCO2: 28 mmol/L (ref 22–32)

## 2019-11-15 LAB — PROTIME-INR
INR: 1.1 (ref 0.8–1.2)
Prothrombin Time: 13.8 seconds (ref 11.4–15.2)

## 2019-11-15 LAB — CBC
HCT: 33.5 % — ABNORMAL LOW (ref 39.0–52.0)
Hemoglobin: 11.5 g/dL — ABNORMAL LOW (ref 13.0–17.0)
MCH: 33 pg (ref 26.0–34.0)
MCHC: 34.3 g/dL (ref 30.0–36.0)
MCV: 96 fL (ref 80.0–100.0)
Platelets: 350 10*3/uL (ref 150–400)
RBC: 3.49 MIL/uL — ABNORMAL LOW (ref 4.22–5.81)
RDW: 13.2 % (ref 11.5–15.5)
WBC: 8.2 10*3/uL (ref 4.0–10.5)
nRBC: 0 % (ref 0.0–0.2)

## 2019-11-15 LAB — GLUCOSE, CAPILLARY
Glucose-Capillary: 277 mg/dL — ABNORMAL HIGH (ref 70–99)
Glucose-Capillary: 243 mg/dL — ABNORMAL HIGH (ref 70–99)
Glucose-Capillary: 149 mg/dL — ABNORMAL HIGH (ref 70–99)
Glucose-Capillary: 83 mg/dL (ref 70–99)
Glucose-Capillary: 67 mg/dL — ABNORMAL LOW (ref 70–99)
Glucose-Capillary: 72 mg/dL (ref 70–99)
Glucose-Capillary: 105 mg/dL — ABNORMAL HIGH (ref 70–99)
Glucose-Capillary: 176 mg/dL — ABNORMAL HIGH (ref 70–99)
Glucose-Capillary: 162 mg/dL — ABNORMAL HIGH (ref 70–99)

## 2019-11-15 LAB — PREPARE RBC (CROSSMATCH)

## 2019-11-15 SURGERY — BYPASS GRAFT FEMORAL-POPLITEAL ARTERY
Anesthesia: General | Laterality: Right

## 2019-11-15 MED ORDER — METOPROLOL SUCCINATE ER 25 MG PO TB24
25.0000 mg | ORAL_TABLET | Freq: Every day | ORAL | Status: DC
  Administered 2019-11-16: 25 mg via ORAL
  Filled 2019-11-15: qty 1

## 2019-11-15 MED ORDER — PROTAMINE SULFATE 10 MG/ML IV SOLN
INTRAVENOUS | Status: AC
  Filled 2019-11-15: qty 25

## 2019-11-15 MED ORDER — LIDOCAINE 2% (20 MG/ML) 5 ML SYRINGE
INTRAMUSCULAR | Status: AC
  Filled 2019-11-15: qty 5

## 2019-11-15 MED ORDER — PHENOL 1.4 % MT LIQD: 1.0000 | OROMUCOSAL | Status: DC | PRN

## 2019-11-15 MED ORDER — ROCURONIUM BROMIDE 10 MG/ML (PF) SYRINGE
PREFILLED_SYRINGE | INTRAVENOUS | Status: AC
  Filled 2019-11-15: qty 10

## 2019-11-15 MED ORDER — HYDROMORPHONE HCL 1 MG/ML IJ SOLN
INTRAMUSCULAR | Status: AC
  Filled 2019-11-15: qty 1

## 2019-11-15 MED ORDER — PROTAMINE SULFATE 10 MG/ML IV SOLN
INTRAVENOUS | Status: DC | PRN
  Administered 2019-11-15: 50 mg via INTRAVENOUS

## 2019-11-15 MED ORDER — PROPOFOL 10 MG/ML IV BOLUS
INTRAVENOUS | Status: DC | PRN
  Administered 2019-11-15: 130 mg via INTRAVENOUS

## 2019-11-15 MED ORDER — FENTANYL CITRATE (PF) 100 MCG/2ML IJ SOLN
INTRAMUSCULAR | Status: DC | PRN
  Administered 2019-11-15 (×3): 50 ug via INTRAVENOUS

## 2019-11-15 MED ORDER — DEXTROSE 50 % IV SOLN
INTRAVENOUS | Status: DC | PRN
  Administered 2019-11-15: 6.25 g via INTRAVENOUS

## 2019-11-15 MED ORDER — INSULIN REGULAR(HUMAN) IN NACL 100-0.9 UT/100ML-% IV SOLN
INTRAVENOUS | Status: DC | PRN
  Administered 2019-11-15: 6.5 [IU]/h via INTRAVENOUS

## 2019-11-15 MED ORDER — INSULIN PUMP
Freq: Three times a day (TID) | SUBCUTANEOUS | Status: DC
  Filled 2019-11-15: qty 1

## 2019-11-15 MED ORDER — PHENYLEPHRINE HCL-NACL 10-0.9 MG/250ML-% IV SOLN
INTRAVENOUS | Status: DC | PRN
  Administered 2019-11-15: 30 ug/min via INTRAVENOUS

## 2019-11-15 MED ORDER — POLYETHYLENE GLYCOL 3350 17 G PO PACK: 17.0000 g | PACK | Freq: Every day | ORAL | Status: DC | PRN

## 2019-11-15 MED ORDER — CEFAZOLIN SODIUM-DEXTROSE 2-4 GM/100ML-% IV SOLN
INTRAVENOUS | Status: AC
  Filled 2019-11-15: qty 100

## 2019-11-15 MED ORDER — DIPHENHYDRAMINE HCL 50 MG/ML IJ SOLN
INTRAMUSCULAR | Status: DC | PRN
  Administered 2019-11-15: 12.5 mg via INTRAVENOUS

## 2019-11-15 MED ORDER — SODIUM CHLORIDE 0.9 % IV SOLN: INTRAVENOUS | Status: DC

## 2019-11-15 MED ORDER — EPHEDRINE SULFATE 50 MG/ML IJ SOLN
INTRAMUSCULAR | Status: DC | PRN
  Administered 2019-11-15: 5 mg via INTRAVENOUS

## 2019-11-15 MED ORDER — SODIUM CHLORIDE 0.9 % IV SOLN
500.0000 mL | Freq: Once | INTRAVENOUS | Status: AC | PRN
  Administered 2019-11-15: 500 mL via INTRAVENOUS

## 2019-11-15 MED ORDER — PHENYLEPHRINE HCL (PRESSORS) 10 MG/ML IV SOLN
INTRAVENOUS | Status: DC | PRN
  Administered 2019-11-15 (×2): 120 ug via INTRAVENOUS

## 2019-11-15 MED ORDER — MIDAZOLAM HCL 5 MG/5ML IJ SOLN
INTRAMUSCULAR | Status: DC | PRN
  Administered 2019-11-15: 2 mg via INTRAVENOUS

## 2019-11-15 MED ORDER — PHENYLEPHRINE 40 MCG/ML (10ML) SYRINGE FOR IV PUSH (FOR BLOOD PRESSURE SUPPORT)
PREFILLED_SYRINGE | INTRAVENOUS | Status: AC
  Filled 2019-11-15: qty 10

## 2019-11-15 MED ORDER — DIPHENHYDRAMINE HCL 50 MG/ML IJ SOLN
INTRAMUSCULAR | Status: AC
  Filled 2019-11-15: qty 1

## 2019-11-15 MED ORDER — BISACODYL 10 MG RE SUPP: 10.0000 mg | Freq: Every day | RECTAL | Status: DC | PRN

## 2019-11-15 MED ORDER — LIDOCAINE 2% (20 MG/ML) 5 ML SYRINGE
INTRAMUSCULAR | Status: DC | PRN
  Administered 2019-11-15: 100 mg via INTRAVENOUS

## 2019-11-15 MED ORDER — HEPARIN SODIUM (PORCINE) 1000 UNIT/ML IJ SOLN
INTRAMUSCULAR | Status: DC | PRN
  Administered 2019-11-15: 7000 [IU] via INTRAVENOUS
  Administered 2019-11-15: 2000 [IU] via INTRAVENOUS

## 2019-11-15 MED ORDER — ONDANSETRON HCL 4 MG/2ML IJ SOLN: 4.0000 mg | Freq: Four times a day (QID) | INTRAMUSCULAR | Status: DC | PRN

## 2019-11-15 MED ORDER — SODIUM CHLORIDE 0.9 % IV SOLN
INTRAVENOUS | Status: AC
  Filled 2019-11-15: qty 1.2

## 2019-11-15 MED ORDER — CEFAZOLIN SODIUM-DEXTROSE 2-3 GM-%(50ML) IV SOLR
INTRAVENOUS | Status: DC | PRN
  Administered 2019-11-15: 2 g via INTRAVENOUS

## 2019-11-15 MED ORDER — EPHEDRINE 5 MG/ML INJ
INTRAVENOUS | Status: AC
  Filled 2019-11-15: qty 10

## 2019-11-15 MED ORDER — MORPHINE SULFATE (PF) 2 MG/ML IV SOLN: 2.0000 mg | INTRAVENOUS | Status: DC | PRN

## 2019-11-15 MED ORDER — 0.9 % SODIUM CHLORIDE (POUR BTL) OPTIME
TOPICAL | Status: DC | PRN
  Administered 2019-11-15: 1000 mL

## 2019-11-15 MED ORDER — LACTATED RINGERS IV SOLN: INTRAVENOUS | Status: DC | PRN

## 2019-11-15 MED ORDER — DOCUSATE SODIUM 100 MG PO CAPS
100.0000 mg | ORAL_CAPSULE | Freq: Every day | ORAL | Status: DC
  Administered 2019-11-16: 100 mg via ORAL
  Filled 2019-11-15: qty 1

## 2019-11-15 MED ORDER — GLYCOPYRROLATE PF 0.2 MG/ML IJ SOSY
PREFILLED_SYRINGE | INTRAMUSCULAR | Status: AC
  Filled 2019-11-15: qty 1

## 2019-11-15 MED ORDER — ACETAMINOPHEN 10 MG/ML IV SOLN
1000.0000 mg | Freq: Once | INTRAVENOUS | Status: DC | PRN
  Administered 2019-11-15: 1000 mg via INTRAVENOUS

## 2019-11-15 MED ORDER — FENTANYL CITRATE (PF) 250 MCG/5ML IJ SOLN
INTRAMUSCULAR | Status: AC
  Filled 2019-11-15: qty 5

## 2019-11-15 MED ORDER — POTASSIUM CHLORIDE CRYS ER 20 MEQ PO TBCR: 20.0000 meq | EXTENDED_RELEASE_TABLET | Freq: Every day | ORAL | Status: DC | PRN

## 2019-11-15 MED ORDER — PROPOFOL 10 MG/ML IV BOLUS
INTRAVENOUS | Status: AC
  Filled 2019-11-15: qty 20

## 2019-11-15 MED ORDER — HYDROMORPHONE HCL 1 MG/ML IJ SOLN
0.2500 mg | INTRAMUSCULAR | Status: DC | PRN
  Administered 2019-11-15 (×2): 0.25 mg via INTRAVENOUS
  Administered 2019-11-15 (×2): 0.5 mg via INTRAVENOUS

## 2019-11-15 MED ORDER — ONDANSETRON HCL 4 MG/2ML IJ SOLN: 4.0000 mg | Freq: Once | INTRAMUSCULAR | Status: DC | PRN

## 2019-11-15 MED ORDER — ONDANSETRON HCL 4 MG/2ML IJ SOLN
INTRAMUSCULAR | Status: DC | PRN
  Administered 2019-11-15: 4 mg via INTRAVENOUS

## 2019-11-15 MED ORDER — ONDANSETRON HCL 4 MG/2ML IJ SOLN
INTRAMUSCULAR | Status: AC
  Filled 2019-11-15: qty 2

## 2019-11-15 MED ORDER — HEMOSTATIC AGENTS (NO CHARGE) OPTIME
TOPICAL | Status: DC | PRN
  Administered 2019-11-15: 1 via TOPICAL

## 2019-11-15 MED ORDER — HEPARIN SODIUM (PORCINE) 5000 UNIT/ML IJ SOLN
5000.0000 [IU] | Freq: Three times a day (TID) | INTRAMUSCULAR | Status: DC
  Administered 2019-11-16 – 2019-11-17 (×4): 5000 [IU] via SUBCUTANEOUS
  Filled 2019-11-15 (×4): qty 1

## 2019-11-15 MED ORDER — MEPERIDINE HCL 25 MG/ML IJ SOLN: 6.2500 mg | INTRAMUSCULAR | Status: DC | PRN

## 2019-11-15 MED ORDER — MIDAZOLAM HCL 2 MG/2ML IJ SOLN
INTRAMUSCULAR | Status: AC
  Filled 2019-11-15: qty 2

## 2019-11-15 MED ORDER — ACETAMINOPHEN 10 MG/ML IV SOLN
INTRAVENOUS | Status: AC
  Filled 2019-11-15: qty 100

## 2019-11-15 MED ORDER — ALBUMIN HUMAN 5 % IV SOLN: INTRAVENOUS | Status: DC | PRN

## 2019-11-15 MED ORDER — MAGNESIUM SULFATE 2 GM/50ML IV SOLN: 2.0000 g | Freq: Every day | INTRAVENOUS | Status: DC | PRN

## 2019-11-15 MED ORDER — CEFAZOLIN SODIUM-DEXTROSE 2-4 GM/100ML-% IV SOLN
2.0000 g | Freq: Three times a day (TID) | INTRAVENOUS | Status: AC
  Administered 2019-11-15 – 2019-11-16 (×2): 2 g via INTRAVENOUS
  Filled 2019-11-15 (×2): qty 100

## 2019-11-15 MED ORDER — SODIUM CHLORIDE 0.9% IV SOLUTION: Freq: Once | INTRAVENOUS | Status: AC

## 2019-11-15 MED ORDER — SODIUM CHLORIDE 0.9 % IV SOLN
INTRAVENOUS | Status: DC | PRN
  Administered 2019-11-15: 500 mL

## 2019-11-15 MED ORDER — OXYCODONE HCL 5 MG/5ML PO SOLN: 5.0000 mg | Freq: Once | ORAL | Status: DC | PRN

## 2019-11-15 MED ORDER — PANTOPRAZOLE SODIUM 40 MG PO TBEC
40.0000 mg | DELAYED_RELEASE_TABLET | Freq: Every day | ORAL | Status: DC
  Administered 2019-11-15 – 2019-11-16 (×2): 40 mg via ORAL
  Filled 2019-11-15 (×2): qty 1

## 2019-11-15 MED ORDER — SUGAMMADEX SODIUM 200 MG/2ML IV SOLN
INTRAVENOUS | Status: DC | PRN
  Administered 2019-11-15: 200 mg via INTRAVENOUS

## 2019-11-15 MED ORDER — HEPARIN SODIUM (PORCINE) 1000 UNIT/ML IJ SOLN
INTRAMUSCULAR | Status: AC
  Filled 2019-11-15: qty 1

## 2019-11-15 MED ORDER — CHLORHEXIDINE GLUCONATE CLOTH 2 % EX PADS: 6.0000 | MEDICATED_PAD | Freq: Every day | CUTANEOUS | Status: DC

## 2019-11-15 MED ORDER — GUAIFENESIN-DM 100-10 MG/5ML PO SYRP: 15.0000 mL | ORAL_SOLUTION | ORAL | Status: DC | PRN

## 2019-11-15 MED ORDER — ALUM & MAG HYDROXIDE-SIMETH 200-200-20 MG/5ML PO SUSP: 15.0000 mL | ORAL | Status: DC | PRN

## 2019-11-15 MED ORDER — OXYCODONE HCL 5 MG PO TABS: 5.0000 mg | ORAL_TABLET | Freq: Once | ORAL | Status: DC | PRN

## 2019-11-15 MED ORDER — GLYCOPYRROLATE 0.2 MG/ML IJ SOLN
INTRAMUSCULAR | Status: DC | PRN
  Administered 2019-11-15 (×2): .1 mg via INTRAVENOUS

## 2019-11-15 MED ORDER — ROCURONIUM BROMIDE 50 MG/5ML IV SOSY
PREFILLED_SYRINGE | INTRAVENOUS | Status: DC | PRN
  Administered 2019-11-15: 40 mg via INTRAVENOUS
  Administered 2019-11-15 (×2): 20 mg via INTRAVENOUS
  Administered 2019-11-15: 40 mg via INTRAVENOUS
  Administered 2019-11-15: 50 mg via INTRAVENOUS
  Administered 2019-11-15: 30 mg via INTRAVENOUS

## 2019-11-15 SURGICAL SUPPLY — 72 items
ADH SKN CLS APL DERMABOND .7 (GAUZE/BANDAGES/DRESSINGS) ×3
BANDAGE ESMARK 6X9 LF (GAUZE/BANDAGES/DRESSINGS) IMPLANT
BNDG CMPR 9X6 STRL LF SNTH (GAUZE/BANDAGES/DRESSINGS) ×1
BNDG ESMARK 6X9 LF (GAUZE/BANDAGES/DRESSINGS) ×3
CANISTER SUCT 3000ML PPV (MISCELLANEOUS) ×3 IMPLANT
CANNULA VESSEL 3MM 2 BLNT TIP (CANNULA) ×3 IMPLANT
CATH EMB 5FR 80CM (CATHETERS) ×2 IMPLANT
CLIP VESOCCLUDE MED 24/CT (CLIP) ×3 IMPLANT
CLIP VESOCCLUDE SM WIDE 24/CT (CLIP) ×3 IMPLANT
COVER WAND RF STERILE (DRAPES) ×1 IMPLANT
CUFF TOURN SGL QUICK 24 (TOURNIQUET CUFF) ×3
CUFF TOURN SGL QUICK 34 (TOURNIQUET CUFF)
CUFF TOURN SGL QUICK 42 (TOURNIQUET CUFF) IMPLANT
CUFF TRNQT CYL 24X4X16.5-23 (TOURNIQUET CUFF) IMPLANT
CUFF TRNQT CYL 34X4.125X (TOURNIQUET CUFF) IMPLANT
DERMABOND ADVANCED (GAUZE/BANDAGES/DRESSINGS) ×6
DERMABOND ADVANCED .7 DNX12 (GAUZE/BANDAGES/DRESSINGS) ×1 IMPLANT
DRAIN CHANNEL 15F RND FF W/TCR (WOUND CARE) IMPLANT
DRAPE HALF SHEET 40X57 (DRAPES) IMPLANT
DRAPE X-RAY CASS 24X20 (DRAPES) IMPLANT
ELECT REM PT RETURN 9FT ADLT (ELECTROSURGICAL) ×3
ELECTRODE REM PT RTRN 9FT ADLT (ELECTROSURGICAL) ×1 IMPLANT
EVACUATOR SILICONE 100CC (DRAIN) IMPLANT
GLOVE BIO SURGEON STRL SZ 6.5 (GLOVE) ×5 IMPLANT
GLOVE BIO SURGEONS STRL SZ 6.5 (GLOVE) ×5
GLOVE BIOGEL PI IND STRL 7.5 (GLOVE) ×1 IMPLANT
GLOVE BIOGEL PI INDICATOR 7.5 (GLOVE) ×2
GLOVE SURG SS PI 7.5 STRL IVOR (GLOVE) ×3 IMPLANT
GOWN STRL REUS W/ TWL LRG LVL3 (GOWN DISPOSABLE) ×2 IMPLANT
GOWN STRL REUS W/ TWL XL LVL3 (GOWN DISPOSABLE) ×1 IMPLANT
GOWN STRL REUS W/TWL LRG LVL3 (GOWN DISPOSABLE) ×6
GOWN STRL REUS W/TWL XL LVL3 (GOWN DISPOSABLE) ×3
HEMOSTAT SNOW SURGICEL 2X4 (HEMOSTASIS) ×2 IMPLANT
INSERT FOGARTY SM (MISCELLANEOUS) IMPLANT
KIT BASIN OR (CUSTOM PROCEDURE TRAY) ×3 IMPLANT
KIT TURNOVER KIT B (KITS) ×3 IMPLANT
LOOP VESSEL MINI RED (MISCELLANEOUS) ×2 IMPLANT
MARKER GRAFT CORONARY BYPASS (MISCELLANEOUS) IMPLANT
NS IRRIG 1000ML POUR BTL (IV SOLUTION) ×6 IMPLANT
PACK PERIPHERAL VASCULAR (CUSTOM PROCEDURE TRAY) ×3 IMPLANT
PAD ARMBOARD 7.5X6 YLW CONV (MISCELLANEOUS) ×6 IMPLANT
SET COLLECT BLD 21X3/4 12 (NEEDLE) IMPLANT
SLEEVE SURGEON STRL (DRAPES) ×2 IMPLANT
STOPCOCK 4 WAY LG BORE MALE ST (IV SETS) ×2 IMPLANT
SUT ETHILON 3 0 PS 1 (SUTURE) IMPLANT
SUT GORETEX 6.0 TT13 (SUTURE) IMPLANT
SUT GORETEX 6.0 TT9 (SUTURE) IMPLANT
SUT MNCRL AB 4-0 PS2 18 (SUTURE) ×4 IMPLANT
SUT PROLENE 5 0 C 1 24 (SUTURE) ×9 IMPLANT
SUT PROLENE 5 0 C 1 36 (SUTURE) ×2 IMPLANT
SUT PROLENE 6 0 BV (SUTURE) ×3 IMPLANT
SUT PROLENE 6 0 CC (SUTURE) ×4 IMPLANT
SUT PROLENE 7 0 BV 1 (SUTURE) ×2 IMPLANT
SUT PROLENE 7 0 BV1 MDA (SUTURE) ×2 IMPLANT
SUT SILK 2 0 PERMA HAND 18 BK (SUTURE) ×2 IMPLANT
SUT SILK 2 0 SH (SUTURE) ×5 IMPLANT
SUT SILK 3 0 (SUTURE) ×6
SUT SILK 3-0 18XBRD TIE 12 (SUTURE) IMPLANT
SUT VIC AB 2-0 CT1 27 (SUTURE) ×6
SUT VIC AB 2-0 CT1 TAPERPNT 27 (SUTURE) ×2 IMPLANT
SUT VIC AB 2-0 CTX 36 (SUTURE) ×2 IMPLANT
SUT VIC AB 3-0 SH 27 (SUTURE) ×12
SUT VIC AB 3-0 SH 27X BRD (SUTURE) ×2 IMPLANT
SUT VICRYL 4-0 PS2 18IN ABS (SUTURE) ×6 IMPLANT
SYR 5ML LL (SYRINGE) ×2 IMPLANT
TOWEL GREEN STERILE (TOWEL DISPOSABLE) ×3 IMPLANT
TRAY FOLEY MTR SLVR 16FR STAT (SET/KITS/TRAYS/PACK) ×3 IMPLANT
TUBE CONNECTING 12'X1/4 (SUCTIONS) ×1
TUBE CONNECTING 12X1/4 (SUCTIONS) ×1 IMPLANT
TUBING EXTENTION W/L.L. (IV SETS) IMPLANT
UNDERPAD 30X30 (UNDERPADS AND DIAPERS) ×3 IMPLANT
WATER STERILE IRR 1000ML POUR (IV SOLUTION) ×3 IMPLANT

## 2019-11-15 NOTE — Discharge Instructions (Signed)
 [facility]   Discharge instructions  Lower Extremity Bypass Surgery  Please refer to the following instruction for your post-procedure care. Your surgeon or physician assistant will discuss any changes with you.  Activity  You are encouraged to walk as much as you can. You can slowly return to normal activities during the month after your surgery. Avoid strenuous activity and heavy lifting until your doctor tells you it's OK. Avoid activities such as vacuuming or swinging a golf club. Do not drive until your doctor give the OK and you are no longer taking prescription pain medications. It is also normal to have difficulty with sleep habits, eating and bowel movement after surgery. These will go away with time.  Bathing/Showering  Shower daily after you go home. Do not soak in a bathtub, hot tub, or swim until the incision heals completely.  Incision Care  Clean your incision with mild soap and water. Shower every day. Pat the area dry with a clean towel. You do not need a bandage unless otherwise instructed. Do not apply any ointments or creams to your incision. If you have open wounds you will be instructed how to care for them or a visiting nurse may be arranged for you. If you have staples or sutures along your incision they will be removed at your post-op appointment. You may have skin glue on your incision. Do not peel it off. It will come off on its own in about one week.  Wash the groin wound with soap and water daily and pat dry. (No tub bath-only shower)  Then put a dry gauze or washcloth in the groin to keep this area dry to help prevent wound infection.  Do this daily and as needed.  Do not use Vaseline or neosporin on your incisions.  Only use soap and water on your incisions and then protect and keep dry.  Diet  Resume your normal diet. There are no special food restrictions following this procedure. A low fat/ low cholesterol diet is  recommended for all patients with vascular disease. In order to heal from your surgery, it is CRITICAL to get adequate nutrition. Your body requires vitamins, minerals, and protein. Vegetables are the best source of vitamins and minerals. Vegetables also provide the perfect balance of protein. Processed food has little nutritional value, so try to avoid this.  Medications  Resume taking all your medications unless your doctor or physician assistant tells you not to. If your incision is causing pain, you may take over-the-counter pain relievers such as acetaminophen (Tylenol). If you were prescribed a stronger pain medication, please aware these medication can cause nausea and constipation. Prevent nausea by taking the medication with a snack or meal. Avoid constipation by drinking plenty of fluids and eating foods with high amount of fiber, such as fruits, vegetables, and grains. Take Colace 100 mg (an over-the-counter stool softener) twice a day as needed for constipation.  Do not take Tylenol if you are taking prescription pain medications.  Follow Up  Our office will schedule a follow up appointment 2-3 weeks following discharge.  Please call us immediately for any of the following conditions  Severe or worsening pain in your legs or feet while at rest or while walking Increase pain, redness, warmth, or drainage (pus) from your incision site(s) Fever of 101 degree or higher The swelling in your leg with the bypass suddenly worsens and becomes more painful than when you were in the hospital If you have   been instructed to feel your graft pulse then you should do so every day. If you can no longer feel this pulse, call the office immediately. Not all patients are given this instruction.  Leg swelling is common after leg bypass surgery.  The swelling should improve over a few months following surgery. To improve the swelling, you may elevate your legs above the level of your heart while you are  sitting or resting. Your surgeon or physician assistant may ask you to apply an ACE wrap or wear compression (TED) stockings to help to reduce swelling.  Reduce your risk of vascular disease  Stop smoking. If you would like help call [facility] at 1-800-QUIT-NOW (1-800-784-8669) or [facility] at 336-586-4000.  Manage your cholesterol Maintain a desired weight Control your diabetes weight Control your diabetes Keep your blood pressure down  If you have any questions, please call the office at [phone]  

## 2019-11-15 NOTE — Anesthesia Procedure Notes (Signed)
Procedure Name: Intubation Date/Time: 11/15/2019 8:27 AM Performed by: Inda Coke, CRNA Pre-anesthesia Checklist: Patient identified, Emergency Drugs available, Suction available and Patient being monitored Patient Re-evaluated:Patient Re-evaluated prior to induction Oxygen Delivery Method: Circle System Utilized Preoxygenation: Pre-oxygenation with 100% oxygen Induction Type: IV induction Ventilation: Mask ventilation without difficulty Laryngoscope Size: Mac and 4 Grade View: Grade I Tube type: Oral Tube size: 7.5 mm Number of attempts: 1 Airway Equipment and Method: Stylet and Oral airway Placement Confirmation: ETT inserted through vocal cords under direct vision,  positive ETCO2 and breath sounds checked- equal and bilateral Secured at: 22 cm Tube secured with: Tape Dental Injury: Teeth and Oropharynx as per pre-operative assessment

## 2019-11-15 NOTE — Op Note (Signed)
Patient name: [name] MRN: [id number] DOB: Nov 25, 1950 Sex: male  11/15/2019 Pre-operative Diagnosis: Right toe ulcer Post-operative diagnosis:  Same Surgeon:  Durene Cal Assistants: Lelon Mast Ryne Procedure:   #1: Right femoral to below-knee popliteal artery bypass graft with ipsilateral translocated saphenous vein   #2: Extensive endarterectomy of the right distal external iliac, common femoral, and profundofemoral artery Anesthesia: General Blood Loss: 150 cc Specimens: None  Findings: Extensive plaque within the common femoral artery which necessitated endarterectomy which extended up into the distal external iliac artery and went down to the origin of the profundofemoral artery.  The patient had an adequate 3-4 mm vein.  The below-knee popliteal artery was calcified but had a widely patent lumen.  Indications: The patient presented to the office 2 days ago with a 6-week history of a right great toe ulcer.  He underwent angiography yesterday which revealed an occluded right superficial femoral artery.  Percutaneous revascularization was unsuccessful and so he comes in today for surgical bypass.  Procedure:  The patient was identified in the holding area and taken to [facility] OR ROOM 11  The patient was then placed supine on the table. general anesthesia was administered.  The patient was prepped and draped in the usual sterile fashion.  A time out was called and antibiotics were administered.  A longitudinal incision was made in the right groin.  Cautery was used about subcutaneous tissue down to the femoral sheath which was opened sharply.  I exposed the common femoral artery from the inguinal ligament down to the bifurcation.  The profunda was dissected out to its primary branches.  The artery was heavily calcified.  Next, through the same incision I identified the saphenous vein and dissected it back to the saphenofemoral junction.  Side branches were ligated between silk ties.  I then  proceeded to harvest the right great saphenous vein through skip incisions down the leg.  Side branches were ligated between silk ties.  I then made a medial below-knee incision where I continued to harvest the saphenous vein.  Once the saphenous vein was fully isolated, I entered the popliteal space from the below-knee incision.  I dissected out the below-knee popliteal artery down to the anterior tibial origin.  I did not divide the anterior tibial vein.  There was a soft spot in the proximal below-knee popliteal artery which I felt would be adequate for a distal target.  Next the saphenous vein was ligated proximally and distally with a 2-0 silk tie.  The vein was prepared on the back table.  It distended nicely to about 4 mm.  It was then marked for orientation.  Next a long Gore tunneler was used to create a subsartorial tunnel between the below-knee incision in the groin.  The patient was then fully heparinized.  After the heparin circulated the common femoral profundofemoral and superficial femoral artery were occluded.  A #11 leg was used to make an arteriotomy in the distal common femoral artery.  This was extended proximally and then distally onto about 1 cm of the profundofemoral artery.  There was extensive circumferential plaque and so I felt endarterectomy was necessary.  A Madelaine Etienne elevator was used to perform an endarterectomy.  I got a good distal endpoint in the profundofemoral artery and tacked down the remaining plaque with interrupted 7-0 Prolene sutures.  I then inserted a #5 Fogarty catheter into the external iliac artery and inflated the balloon for proximal control which enabled me to remove plaque  up into the distal external iliac artery.  There was excellent inflow.  All potential embolic debris was removed.  The vein was then brought onto the table and placed in a nonreversed fashion.  It was spatulated to fit the size of the arteriotomy.  I elected to use the proximal portion of the  vein to function as a patch.  This was about 2.5 cm onto the common femoral artery and 1 cm onto the profundofemoral artery.  This was done with running 5-0 Prolene.  Prior to completion the appropriate flushing maneuvers were performed and the anastomosis was completed.  The vein was then brought through the previously created tunnel, making sure to maintain proper orientation.  A tourniquet was then placed on the upper thigh.  The leg was exsanguinated with an Esmarch.  The tourniquet was taken to 250 mm of pressure.  An additional heparin bolus was given prior to tourniquet inflation.  Next a #11 blade was used to make an arteriotomy in the below-knee popliteal artery which was extended longitudinally with Potts scissors.  There was a widely patent lumen within the below-knee popliteal artery although it was mild to moderately calcified.  The leg was then straightened and the vein graft was cut to the appropriate length.  It was then spatulated to fit the size of the arteriotomy and a running anastomosis was created with 6-0 Prolene.  Prior to completion the tourniquet was let down, the appropriate flushing maneuvers were performed and the anastomosis was completed.  At this point the patient had brisk graft dependent Doppler signals in the anterior tibial and posterior tibial artery.  The heparin was then reversed with 50 mg of protamine.  The vein harvest incisions were closed with 2 layers of 3-0 Vicryl.  The below-knee incision was closed by reapproximating the fascia with 2-0 Vicryl in the subcutaneous tissue with 2-0 Vicryl followed by 4-0 subcuticular suture.  The groin was closed by reapproximating the femoral sheath with 2-0 Vicryl subcutaneous tissue with 2-0 Vicryl and then an additional layer of 3-0 Vicryl followed by subcuticular closure.  Dermabond was applied to all incisions.  The patient was successfully extubated and taken to recovery in stable condition.  There were no immediate  complications.   Disposition: To PACU stable.   Theotis Burrow, M.D., [facility] [place] Office: [phone] Pager:  [phone]

## 2019-11-15 NOTE — Progress Notes (Signed)
Inpatient Diabetes Program Recommendations  AACE/ADA: New Consensus Statement on Inpatient Glycemic Control (2015)  Target Ranges:  Prepandial:   less than 140 mg/dL      Peak postprandial:   less than 180 mg/dL (1-2 hours)      Critically ill patients:  140 - 180 mg/dL   Lab Results  Component Value Date   GLUCAP 105 (H) 11/15/2019   HGBA1C 8.0 (H) 11/14/2019    Review of Glycemic Control  Diabetes history: DM 2 Outpatient Diabetes medications: insulin pump, Humalog insulin Current orders for Inpatient glycemic control: Novolog 0-15 units tid  Inpatient Diabetes Program Recommendations:    Start insulin pump/order set when pt more awake. Pt with insulin pump and supplies at bedside.  Pt sees Dr. Timothy Lasso as a PCP and also manages DM.  Addendum 1615 pm: insulin pump applied to pt abdomen left side (pump site to be changed every 3 days).  Thanks,  Christena Deem RN, MSN, BC-ADM Inpatient Diabetes Coordinator Team Pager [phone] (8a-5p)

## 2019-11-15 NOTE — Interval H&P Note (Signed)
History and Physical Interval Note:  11/15/2019 7:42 AM  [name]  has presented today for surgery, with the diagnosis of PERIPHERAL ARTERY DISEASE.  The various methods of treatment have been discussed with the patient and family. After consideration of risks, benefits and other options for treatment, the patient has consented to  Procedure(s): BYPASS GRAFT FEMORAL-POPLITEAL VEIN (Right) as a surgical intervention.  The patient's history has been reviewed, patient examined, no change in status, stable for surgery.  I have reviewed the patient's chart and labs.  Questions were answered to the patient's satisfaction.     [name]  Discussed proceeding with right fem  pop bypass for limb slavage  wb

## 2019-11-15 NOTE — Transfer of Care (Signed)
Immediate Anesthesia Transfer of Care Note  Patient: [name]  Procedure(s) Performed: BYPASS GRAFT FEMORAL-POPLITEAL ARTERY using Right Leg Greater Saphenous Vein (Right )  Patient Location: PACU  Anesthesia Type:General  Level of Consciousness: awake and alert   Airway & Oxygen Therapy: Patient Spontanous Breathing and Patient connected to face mask oxygen  Post-op Assessment: Report given to RN and Post -op Vital signs reviewed and stable  Post vital signs: Reviewed and stable  Last Vitals:  Vitals Value Taken Time  BP 101/41 11/15/19 1237  Temp 37.2 C 11/15/19 1237  Pulse 69 11/15/19 1240  Resp 18 11/15/19 1240  SpO2 100 % 11/15/19 1240  Vitals shown include unvalidated device data.  Last Pain:  Vitals:   11/15/19 0603  TempSrc: Oral  PainSc:          Complications: No apparent anesthesia complications

## 2019-11-15 NOTE — Progress Notes (Signed)
  Day of Surgery Note    Subjective:  Sleepy but wakes to voice.  Says he has a little pain.   Vitals:   11/15/19 1408 11/15/19 1416  BP: (!) 102/48 (!) 107/43  Pulse: 72 72  Resp: 14 13  Temp:  98.2 F (36.8 C)  SpO2: 93% 95%    Incisions:   All incisions are clean and dry Extremities:  Brisk doppler signals right DP/PT; monophasic peroneal Cardiac:  regular Lungs:  Non labored    Assessment/Plan:  This is a 69 y.o. male who is s/p  Right femoral to below knee bypass grafting with non reversed GSV  -pt doing well on the floor with patent bypass with brisk doppler signals -DM coordinator consulted for insulin pump.  She is going to connect later this afternoon once pt is a little more alert from surgery.     Doreatha Massed, PA-C 11/15/2019 3:17 PM [phone]

## 2019-11-15 NOTE — Progress Notes (Signed)
PT Cancellation Note  Patient Details Name: [name] MRN: [id number] DOB: 02-18-1951   Cancelled Treatment:    Reason Eval/Treat Not Completed: Patient not medically ready. PT with mobility orders for POD 1. PT will hold evaluation until tomorrow.   Arlyss Gandy 11/15/2019, 3:02 PM

## 2019-11-15 NOTE — Anesthesia Postprocedure Evaluation (Signed)
Anesthesia Post Note  Patient: [name]  Procedure(s) Performed: BYPASS GRAFT FEMORAL-POPLITEAL ARTERY using Right Leg Greater Saphenous Vein (Right )     Patient location during evaluation: PACU Anesthesia Type: General Level of consciousness: awake and alert Pain management: pain level controlled Vital Signs Assessment: post-procedure vital signs reviewed and stable Respiratory status: spontaneous breathing, nonlabored ventilation, respiratory function stable and patient connected to nasal cannula oxygen Cardiovascular status: blood pressure returned to baseline and stable Postop Assessment: no apparent nausea or vomiting Anesthetic complications: no    Last Vitals:  Vitals:   11/15/19 1256 11/15/19 1303  BP: (!) 97/49 (!) 100/41  Pulse: 68 66  Resp: 14 19  Temp:    SpO2: 97% 98%    Last Pain:  Vitals:   11/15/19 1251  TempSrc:   PainSc: 0-No pain    LLE Motor Response: Purposeful movement (11/15/19 1251) LLE Sensation: Full sensation (11/15/19 1251) RLE Motor Response: Purposeful movement (11/15/19 1251) RLE Sensation: Full sensation(pt states " feels normal") (11/15/19 1251)      Trevor Iha

## 2019-11-16 ENCOUNTER — Ambulatory Visit (HOSPITAL_BASED_OUTPATIENT_CLINIC_OR_DEPARTMENT_OTHER): Payer: Medicare Other

## 2019-11-16 DIAGNOSIS — I739 Peripheral vascular disease, unspecified: Secondary | ICD-10-CM

## 2019-11-16 DIAGNOSIS — Z89512 Acquired absence of left leg below knee: Secondary | ICD-10-CM | POA: Diagnosis not present

## 2019-11-16 DIAGNOSIS — L97519 Non-pressure chronic ulcer of other part of right foot with unspecified severity: Secondary | ICD-10-CM | POA: Diagnosis present

## 2019-11-16 DIAGNOSIS — J449 Chronic obstructive pulmonary disease, unspecified: Secondary | ICD-10-CM | POA: Diagnosis present

## 2019-11-16 DIAGNOSIS — L0390 Cellulitis, unspecified: Secondary | ICD-10-CM | POA: Diagnosis not present

## 2019-11-16 DIAGNOSIS — Z20822 Contact with and (suspected) exposure to covid-19: Secondary | ICD-10-CM | POA: Diagnosis present

## 2019-11-16 DIAGNOSIS — E1051 Type 1 diabetes mellitus with diabetic peripheral angiopathy without gangrene: Secondary | ICD-10-CM | POA: Diagnosis present

## 2019-11-16 DIAGNOSIS — I252 Old myocardial infarction: Secondary | ICD-10-CM | POA: Diagnosis not present

## 2019-11-16 DIAGNOSIS — E10319 Type 1 diabetes mellitus with unspecified diabetic retinopathy without macular edema: Secondary | ICD-10-CM | POA: Diagnosis present

## 2019-11-16 DIAGNOSIS — I2510 Atherosclerotic heart disease of native coronary artery without angina pectoris: Secondary | ICD-10-CM | POA: Diagnosis present

## 2019-11-16 DIAGNOSIS — F17210 Nicotine dependence, cigarettes, uncomplicated: Secondary | ICD-10-CM | POA: Diagnosis present

## 2019-11-16 DIAGNOSIS — Z794 Long term (current) use of insulin: Secondary | ICD-10-CM | POA: Diagnosis not present

## 2019-11-16 DIAGNOSIS — E10621 Type 1 diabetes mellitus with foot ulcer: Secondary | ICD-10-CM | POA: Diagnosis present

## 2019-11-16 DIAGNOSIS — Z79899 Other long term (current) drug therapy: Secondary | ICD-10-CM | POA: Diagnosis not present

## 2019-11-16 DIAGNOSIS — I70201 Unspecified atherosclerosis of native arteries of extremities, right leg: Secondary | ICD-10-CM | POA: Diagnosis present

## 2019-11-16 DIAGNOSIS — Z7982 Long term (current) use of aspirin: Secondary | ICD-10-CM | POA: Diagnosis not present

## 2019-11-16 DIAGNOSIS — E7800 Pure hypercholesterolemia, unspecified: Secondary | ICD-10-CM | POA: Diagnosis present

## 2019-11-16 DIAGNOSIS — E785 Hyperlipidemia, unspecified: Secondary | ICD-10-CM | POA: Diagnosis present

## 2019-11-16 DIAGNOSIS — I10 Essential (primary) hypertension: Secondary | ICD-10-CM | POA: Diagnosis present

## 2019-11-16 LAB — CBC
HCT: 27.5 % — ABNORMAL LOW (ref 39.0–52.0)
Hemoglobin: 9.2 g/dL — ABNORMAL LOW (ref 13.0–17.0)
MCH: 32.3 pg (ref 26.0–34.0)
MCHC: 33.5 g/dL (ref 30.0–36.0)
MCV: 96.5 fL (ref 80.0–100.0)
Platelets: 262 10*3/uL (ref 150–400)
RBC: 2.85 MIL/uL — ABNORMAL LOW (ref 4.22–5.81)
RDW: 13.2 % (ref 11.5–15.5)
WBC: 8.1 10*3/uL (ref 4.0–10.5)
nRBC: 0 % (ref 0.0–0.2)

## 2019-11-16 LAB — BASIC METABOLIC PANEL
Anion gap: 6 (ref 5–15)
BUN: 15 mg/dL (ref 8–23)
CO2: 22 mmol/L (ref 22–32)
Calcium: 7.8 mg/dL — ABNORMAL LOW (ref 8.9–10.3)
Chloride: 108 mmol/L (ref 98–111)
Creatinine, Ser: 0.87 mg/dL (ref 0.61–1.24)
GFR calc Af Amer: >60 mL/min (ref >60)
GFR calc non Af Amer: >60 mL/min (ref >60)
Glucose, Bld: 98 mg/dL (ref 70–99)
Potassium: 4.2 mmol/L (ref 3.5–5.1)
Sodium: 136 mmol/L (ref 135–145)

## 2019-11-16 LAB — GLUCOSE, CAPILLARY
Glucose-Capillary: 104 mg/dL — ABNORMAL HIGH (ref 70–99)
Glucose-Capillary: 38 mg/dL — CL (ref 70–99)
Glucose-Capillary: 91 mg/dL (ref 70–99)
Glucose-Capillary: 168 mg/dL — ABNORMAL HIGH (ref 70–99)
Glucose-Capillary: 105 mg/dL — ABNORMAL HIGH (ref 70–99)

## 2019-11-16 NOTE — Evaluation (Signed)
Physical Therapy Evaluation Patient Details Name: [name] MRN: [id number] DOB: [birth year] Today's Date: 11/16/2019   History of Present Illness  Patient is a 69 year old male with x6 week ulcer R toe, angiography reveal occluded right superficial femoral artery. Patient s/p occluded right superficial femoral artery vein, Pt s/p Right femoral to below-knee popliteal artery bypass graft with ipsilateral translocated saphenous vein and Extensive endarterectomy of the right distal external iliac, common femoral, and profundofemoral artery on 11/15/19  Clinical Impression  Pt admitted with above diagnosis. Pt requiring min-mod A for transfers but was able to lateral scoot with supervision.  He was able to ambulate 4'x2 but was limited by pain.  Pt had prior L BKA and is familiar with rehab and transfer techniques - reports very comfortable with w/c at home.  Pt will benefit from PT to advance mobility and safety; however, if pt is discharging he is able to transfer to w/c, has necessary DME and support at home for w/c level mobility.   Pt currently with functional limitations due to the deficits listed below (see PT Problem List). Pt will benefit from skilled PT to increase their independence and safety with mobility to allow discharge to the venue listed below.       Follow Up Recommendations Home health PT;Supervision/Assistance - 24 hour    Equipment Recommendations  None recommended by PT(reports has DME)    Recommendations for Other Services       Precautions / Restrictions Precautions Precautions: Fall Restrictions Weight Bearing Restrictions: Yes RLE Weight Bearing: Weight bearing as tolerated LLE Weight Bearing: Weight bearing as tolerated      Mobility  Bed Mobility Overal bed mobility: Needs Assistance Bed Mobility: Supine to Sit;Sit to Supine     Supine to sit: Min assist Sit to supine: Min assist   General bed mobility comments: min A for R  LE  Transfers Overall transfer level: Needs assistance Equipment used: Rolling walker (2 wheeled) Transfers: Sit to/from Stand;Lateral/Scoot Transfers Sit to Stand: Mod assist        Lateral/Scoot Transfers: Supervision General transfer comment: Pt requiring mod A and cues for hand placement for sit to stand; he was able to lateral scoot at EOB without difficulty  Ambulation/Gait Ambulation/Gait assistance: Min assist Gait Distance (Feet): 4 Feet(4'x2  w/ seated rest break) Assistive device: Rolling walker (2 wheeled) Gait Pattern/deviations: Step-to pattern;Decreased stride length;Decreased weight shift to right;Antalgic Gait velocity: decreased   General Gait Details: Limited by pain in R LE; min A for steadying  Stairs            Wheelchair Mobility    Modified Rankin (Stroke Patients Only)       Balance Overall balance assessment: Needs assistance Sitting-balance support: No upper extremity supported;Feet supported Sitting balance-Leahy Scale: Good     Standing balance support: Bilateral upper extremity supported;During functional activity Standing balance-Leahy Scale: Poor Standing balance comment: unable to come to standing                             Pertinent Vitals/Pain Pain Assessment: 0-10 Pain Score: 2  Faces Pain Scale: Hurts whole lot Pain Location: R leg Pain Descriptors / Indicators: Operative site guarding;Grimacing Pain Intervention(s): Limited activity within patient's tolerance;Monitored during session;Premedicated before session    Home Living Family/patient expects to be discharged to:: Private residence Living Arrangements: Spouse/significant other Available Help at Discharge: Family;Available PRN/intermittently;Other (Comment) Type of Home: House Home Access: Ramped  entrance     Home Layout: One level Home Equipment: Shower seat;Wheelchair - [employer] - 2 wheels(may have a bsc)      Prior Function Level of  Independence: Independent         Comments: L LE prosthetic; 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  Demonstrated ROM <L side but WFL.  Demonstrating 1/5 hip, 3/5 knee ext, 3/5 ankle DF strength but not further tested due to pain. LLE Deficits / Details: Pt with BKA with prosthetic in room .  Demonstrated 4/5 strength in knee extension and ROM WFL.    Cervical / Trunk Assessment Cervical / Trunk Assessment: Normal  Communication   Communication: No difficulties  Cognition Arousal/Alertness: Awake/alert Behavior During Therapy: WFL for tasks assessed/performed Overall Cognitive Status: Within Functional Limits for tasks assessed                                        General Comments General comments (skin integrity, edema, etc.): VSS.  Pt very hesitant to use RW.  Reports s/p amputation he fell twice with RW, but he does realize this is different as using it for pain control/decreased weight on R LE as opposed to hopping like he did after surgery.  Pt did have decreased safety with RW during ambulation and sit/stand today. Discussed this with pt and recommended lateral scoots to w/c at home until progresses with therapy.  Pt states he understands and has done in past.  States w/c will fit in all rooms in house.    Exercises     Assessment/Plan    PT Assessment Patient needs continued PT services  PT Problem List Decreased strength;Decreased mobility;Decreased range of motion;Decreased coordination;Decreased knowledge of precautions;Decreased activity tolerance;Decreased balance;Decreased knowledge of use of DME;Pain       PT Treatment Interventions DME  instruction;Therapeutic activities;Gait training;Therapeutic exercise;Patient/family education;Balance training;Functional mobility training;Wheelchair mobility training    PT Goals (Current goals can be found in the Care Plan section)  Acute Rehab PT Goals Patient Stated Goal: stand up and walk PT Goal Formulation: With patient Time For Goal Achievement: 11/30/19 Potential to Achieve Goals: Good    Frequency Min 3X/week   Barriers to discharge        Co-evaluation               AM-PAC PT "6 Clicks" Mobility  Outcome Measure Help needed turning from your back to your side while in a flat bed without using bedrails?: None Help needed moving from lying on your back to sitting on the side of a flat bed without using bedrails?: A Little Help needed moving to and from a bed to a chair (including a wheelchair)?: A Little Help needed standing up from a chair using your arms (e.g., wheelchair or bedside chair)?: A Lot Help needed to walk in hospital room?: A Little Help needed climbing 3-5 steps with a railing? : A Lot 6 Click Score: 17    End of Session Equipment Utilized During Treatment: Gait belt Activity Tolerance: Patient tolerated treatment well Patient left: in bed;with call bell/phone within reach;with bed alarm set Nurse Communication:  Mobility status PT Visit Diagnosis: Unsteadiness on feet (R26.81);Pain Pain - Right/Left: Right Pain - part of body: Leg    Time: 1310-1335 PT Time Calculation (min) (ACUTE ONLY): 25 min   Charges:   PT Evaluation $PT Eval Moderate Complexity: 1 Mod PT Treatments $Gait Training: 8-22 mins        Maggie Font, PT Acute Rehab Services Pager [phone] [facility] [phone] [facility] [phone]   Karlton Lemon 11/16/2019, 2:26 PM

## 2019-11-16 NOTE — Progress Notes (Addendum)
  Progress Note    11/16/2019 7:30 AM 1 Day Post-Op  Subjective:  C/o pain in the incisions; wants to know when he can go home.   Afebrile HR 60's-70's NSR 110's-140's systolic 96% RA  Vitals:   11/16/19 0034 11/16/19 0430  BP: (!) 112/53 (!) 127/51  Pulse: 72 65  Resp:  18  Temp: 98.6 F (37 C) 98.9 F (37.2 C)  SpO2: 96% 99%    Physical Exam: Cardiac:  regular Lungs:  Non labored Incisions:  All incisions look good Extremities:  Brisk doppler signals right DP/PT; right foot warm; ulcer on plantar surface of right great toe.   CBC    Component Value Date/Time   WBC 8.1 11/16/2019 0248   RBC 2.85 (L) 11/16/2019 0248   HGB 9.2 (L) 11/16/2019 0248   HCT 27.5 (L) 11/16/2019 0248   PLT 262 11/16/2019 0248   MCV 96.5 11/16/2019 0248   MCH 32.3 11/16/2019 0248   MCHC 33.5 11/16/2019 0248   RDW 13.2 11/16/2019 0248   LYMPHSABS 1.6 03/02/2016 1530   MONOABS 1.6 (H) 03/02/2016 1530   EOSABS 0.0 03/02/2016 1530   BASOSABS 0.0 03/02/2016 1530    BMET    Component Value Date/Time   NA 136 11/16/2019 0248   NA 141 12/17/2015 0000   K 4.2 11/16/2019 0248   CL 108 11/16/2019 0248   CO2 22 11/16/2019 0248   GLUCOSE 98 11/16/2019 0248   BUN 15 11/16/2019 0248   BUN 17 12/17/2015 0000   CREATININE 0.87 11/16/2019 0248   CALCIUM 7.8 (L) 11/16/2019 0248   GFRNONAA >60 11/16/2019 0248   GFRAA >60 11/16/2019 0248    INR    Component Value Date/Time   INR 1.1 11/15/2019 0258     Intake/Output Summary (Last 24 hours) at 11/16/2019 0730 Last data filed at 11/16/2019 6644 Gross per 24 hour  Intake 5320.77 ml  Output 2305 ml  Net 3015.77 ml     Assessment:  69 y.o. male is s/p:  #1: Right femoral to below-knee popliteal artery bypass graft with ipsilateral translocated saphenous vein #2: Extensive endarterectomy of the right distal external iliac, common femoral, and profundofemoral artery  1 Day Post-Op  Plan: -pt with brisk doppler signals right  DP/PT -foley out this am-has not voided yet. -CBG this am was 38 now 91.  His insulin pump was put on yesterday.  He did not give any insulin last evening.  -need to mobilize today.  If doing okay mobilizing and pain controlled, may be able to discharge home tomorrow, but will see how he does today. -DVT prophylaxis:  Sq heparin   Doreatha Massed, PA-C [facility] [phone] 11/16/2019 7:30 AM   I agree with the above.  Possible d/c tomorrow vs Saturday  Durene Cal

## 2019-11-16 NOTE — Progress Notes (Signed)
ABI has been completed.   Preliminary results in CV Proc.   Blanch Media 11/16/2019 11:22 AM

## 2019-11-16 NOTE — Progress Notes (Addendum)
CBG 38 mg/dlat 06:30 am. We recheck twice. Pt is asymptomatic. Alert and awake. Vital signs remain stable. 8 oz of orange juice given. He has very good oral intake. Pt denied given himself any insulin tonight. We will continue to monitor closely.  Nehemiah Massed, RN

## 2019-11-16 NOTE — Evaluation (Signed)
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 Patient s/p occluded right superficial femoral artery vein, Extensive endarterectomy of the right distal external iliac, common femoral, and profundofemoral artery.    Clinical Impression   Patient is a 69 year old male that lives with s/o in a single level home with level entry. Patient reports he is independent with self care and ambulates with prosthetic no AD at baseline. Currently patient min A with supine to sit for trunk support, set up A to don prosthetic. Provide verbal cues for safety with transfer not to pull up on walker when standing. Patient attempt x1 to stand EOB however due to increased pain unable to clear buttock. Patient supervision for sit to supine. Will continue to follow to maximize patient safety and independence with self care.     Follow Up Recommendations  Home health OT;Supervision/Assistance - 24 hour;Other (comment)(may progress to no follow up pending dec. pain)    Equipment Recommendations  3 in 1 bedside commode;Other (comment)(pending patient progress with mob)       Precautions / Restrictions Precautions Precautions: Fall Restrictions Weight Bearing Restrictions: Yes RLE Weight Bearing: Weight bearing as tolerated LLE Weight Bearing: Weight bearing as tolerated      Mobility Bed Mobility Overal bed mobility: Needs Assistance Bed Mobility: Supine to Sit;Sit to Supine     Supine to sit: Min assist;HOB elevated Sit to supine: Supervision   General bed mobility comments: min A for trunk elevation to sitting, patient able to lift LEs onto bed to return to supine  Transfers Overall transfer level: Needs assistance Equipment used: Rolling walker (2 wheeled)              General transfer comment: attempt sit to stand x1, pt unable to clear buttock due to increased pain. mod cues for safety with body mechanics with patient attempting to pull up on walker    Balance Overall balance assessment: Needs assistance Sitting-balance support: No upper extremity supported;Feet supported Sitting balance-Leahy Scale: Good         Standing balance comment: unable to come to standing                           ADL either performed or assessed with clinical judgement   ADL Overall ADL's : Needs assistance/impaired Eating/Feeding: Independent   Grooming: Set up;Sitting   Upper Body Bathing: Set up;Sitting   Lower Body Bathing: Sitting/lateral leans;Moderate assistance Lower Body Bathing Details (indicate cue type and reason): due to pain with attempting to stand Upper Body Dressing : Set up;Sitting   Lower Body Dressing: Sitting/lateral leans;Moderate assistance Lower Body Dressing Details (indicate cue type and reason): patient able to don L prosthesis with set up assist in sitting Toilet Transfer: Moderate assistance;Maximal assistance Toilet Transfer Details (indicate cue type and reason): patient attempt to stand seated EOB however unable to fully lift buttock due to pain in LE, anticipate less assist with pain controlled           General ADL Comments: patient currently limited beyond sitting EOB due to increased pain with attempting to stand                  Pertinent Vitals/Pain Pain Assessment: Faces Faces Pain Scale: Hurts whole lot  Pain Location: groin Pain Descriptors / Indicators: Operative site guarding;Grimacing Pain Intervention(s): Limited activity within patient's tolerance;Monitored during session     Hand Dominance Right   Extremity/Trunk Assessment Upper Extremity Assessment Upper Extremity Assessment: Overall WFL for tasks assessed           Communication Communication Communication: No difficulties    Cognition Arousal/Alertness: Awake/alert Behavior During Therapy: WFL for tasks assessed/performed Overall Cognitive Status: Within Functional Limits for tasks assessed                                     General Comments  educate patient to ask for pain medication at any time, offer to ask RN for pain medication during treatment however patient declined.             Home Living Family/patient expects to be discharged to:: Private residence Living Arrangements: Spouse/significant other Available Help at Discharge: Family;Available PRN/intermittently;Other (Comment)(reports s/o is available "most of the time") Type of Home: House Home Access: Level entry     Home Layout: One level     Bathroom Shower/Tub: Producer, television/film/video: Handicapped height     Home Equipment: Shower seat          Prior Functioning/Environment Level of Independence: Independent        Comments: L LE prosthetic        OT Problem List: Decreased activity tolerance;Impaired balance (sitting and/or standing);Decreased safety awareness;Pain      OT Treatment/Interventions: Self-care/ADL training;Therapeutic exercise;DME and/or AE instruction;Therapeutic activities;Patient/family education;Balance training    OT Goals(Current goals can be found in the care plan section) Acute Rehab OT Goals Patient Stated Goal: to eat breakfast OT Goal Formulation: With patient Time For Goal Achievement: 11/30/19 Potential to Achieve Goals: Good  OT Frequency: Min 2X/week    AM-PAC OT "6 Clicks" Daily Activity     Outcome Measure Help from another person eating meals?: None Help from another person taking care of personal grooming?: A Little Help from another person toileting, which includes using toliet, bedpan, or urinal?: A Lot Help from another person bathing (including washing, rinsing, drying)?: A Lot Help from another person to put on and taking off regular upper body  clothing?: A Little Help from another person to put on and taking off regular lower body clothing?: A Lot 6 Click Score: 16   End of Session Equipment Utilized During Treatment: Rolling walker  Activity Tolerance: Patient limited by pain Patient left: in bed;with call bell/phone within reach;with bed alarm set  OT Visit Diagnosis: Other abnormalities of gait and mobility (R26.89);Pain Pain - Right/Left: Left Pain - part of body: Leg                Time: 6948-5462 OT Time Calculation (min): 23 min Charges:  OT General Charges $OT Visit: 1 Visit OT Evaluation $OT Eval Moderate Complexity: 1 Mod OT Treatments $Self Care/Home Management : 8-22 mins  Myrtie Neither OT OT office: [phone]  Carmelia Roller 11/16/2019, 12:31 PM

## 2019-11-16 NOTE — Progress Notes (Signed)
Results for ANUEL, SITTER (MRN [id number]) as of 11/16/2019 12:41  Ref. Range 11/15/2019 21:34 11/16/2019 02:34 11/16/2019 06:28 11/16/2019 07:02 11/16/2019 11:36  Glucose-Capillary Latest Ref Range: 70 - 99 mg/dL 094 (H) 709 (H) 38 (LL) 91 168 (H)  Noted that patient had low blood sugar this am.   Spoke with patient at bedside about his insulin pump. States that he has low blood sugars at home from time to time and treats with orange juice. Does not have symptoms of hypoglycemia. States that he does not want his insulin pump settings changed. He states that he was not able to get through to get his breakfast ordered, so someone brought him a biscuit around 10:00 this am. He ate part of it at that time. Recommended that he try to get his trays ordered early. Did get a lunch tray at 1230 today.   Hopes to get to go home tomorrow. States that he would like to get some sleep since people were coming in and out of the room last night every hour!   Will continue to monitor blood sugars while in the hospital.  Smith Mince RN BSN CDE Diabetes Coordinator Pager: [phone]  8am-5pm

## 2019-11-17 ENCOUNTER — Telehealth: Payer: Self-pay

## 2019-11-17 LAB — GLUCOSE, CAPILLARY
Glucose-Capillary: 162 mg/dL — ABNORMAL HIGH (ref 70–99)
Glucose-Capillary: 82 mg/dL (ref 70–99)

## 2019-11-17 LAB — TYPE AND SCREEN
ABO/RH(D): A POS
Antibody Screen: NEGATIVE
Unit division: 0
Unit division: 0

## 2019-11-17 LAB — BPAM RBC
Blood Product Expiration Date: 202104212359
Blood Product Expiration Date: 202104212359
Unit Type and Rh: 6200
Unit Type and Rh: 6200

## 2019-11-17 MED ORDER — OXYCODONE HCL 5 MG PO TABS: 5.0000 mg | ORAL_TABLET | Freq: Four times a day (QID) | ORAL | 0 refills | Status: DC | PRN

## 2019-11-17 NOTE — Progress Notes (Signed)
11/17/19 Per phone converstation @ 11:50 am  Called patient at home and confirmed that he would like home health. PT and OT were recommended. Will let CM Kristi know. Thomas Hoff, RN

## 2019-11-17 NOTE — Telephone Encounter (Signed)
Pt called with c/o being discharged today from hospital but has not been able to have a BM in the last few days. Pt put his wife on the phone and we discussed pt using a suppository and/or stool softener. Pt is opposed to suppository and would like to try Dulcolax. Discussed the importance of high fiber foods in conjunction with adequate water intake. Pt has not been drinking as many liquids the last few days but will make an effort to do so. They will call us back if they have further questions/concerns.

## 2019-11-17 NOTE — Plan of Care (Signed)
DISCHARGE NOTE HOME [name] to be discharged home per MD order. Discussed prescriptions and follow up appointments with the patient. Prescriptions given to patient; medication list explained in detail. Patient verbalized understanding.  Skin clean, dry and intact without evidence of skin break down, no evidence of skin tears noted. IV catheter discontinued intact. Site without signs and symptoms of complications. Dressing and pressure applied. Pt denies pain at the site currently. No complaints noted.  Patient free of lines, drains, and wounds.   An After Visit Summary (AVS) was printed and given to the patient. Patient escorted via wheelchair, and discharged home via private auto.  Arlice Colt, RN

## 2019-11-17 NOTE — Progress Notes (Signed)
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 Patient s/p occluded right superficial femoral artery vein, Pt s/p Right femoral to below-knee popliteal artery bypass graft with ipsilateral translocated saphenous vein and Extensive endarterectomy of the right distal external iliac, common femoral, and profundofemoral artery on 11/15/19    PT Comments    Patient progressing today with ambulation and able to demonstrate safe w/c mobility in the room.  Will have wife to assist at d/c.  No concerns about mobility progression especially with follow up HHPT.  Will follow up if not d/c.    Follow Up Recommendations  Home health PT;Supervision/Assistance - 24 hour     Equipment Recommendations  None recommended by PT    Recommendations for Other Services       Precautions / Restrictions Precautions Precautions: Fall Precaution Comments: L BKA    Mobility  Bed Mobility Overal bed mobility: Needs Assistance Bed Mobility: Supine to Sit     Supine to sit: Min assist     General bed mobility comments: struggled to sit up despite cues for rolling to side, pulled up on my hand  Transfers Overall transfer level: Needs assistance Equipment used: Rolling walker (2 wheeled) Transfers: Sit to/from Stand;Lateral/Scoot Transfers Sit to Stand: Min guard        Lateral/Scoot Transfers: Supervision General transfer comment: assist after donning L prosthesis to stand pt taking increased time in anticipation of pain, but reported much better than yesterday.  Scooting to w/c with S after w/c set up  Ambulation/Gait Ambulation/Gait assistance: Min guard;Supervision Gait Distance (Feet): 22 Feet Assistive device: Rolling walker (2 wheeled) Gait Pattern/deviations:  Step-through pattern;Decreased stride length     General Gait Details: R LE pain, but reports much improved   Theme park manager mobility: Yes Wheelchair propulsion: Both upper extremities Wheelchair parts: Supervision/cueing Distance: 22 Wheelchair Assistance Details (indicate cue type and reason): in room maneuvering to door and back to bed, reports feels safe and secure, no need for out in hallway  Modified Rankin (Stroke Patients Only)       Balance Overall balance assessment: Needs assistance   Sitting balance-Leahy Scale: Good Sitting balance - Comments: seated EOB to don prosthesis   Standing balance support: Bilateral upper extremity supported Standing balance-Leahy Scale: Poor Standing balance comment: UE support for balance in standing                            Cognition Arousal/Alertness: Awake/alert Behavior During Therapy: WFL for tasks assessed/performed Overall Cognitive Status: Within Functional Limits for tasks assessed                                        Exercises Other Exercises Other Exercises: encouraged hip extension in standing and in supine as much as tolerated (gentle stretch) to avoid scar binding down and decreased upright posture    General Comments General comments (skin integrity, edema, etc.): Discussed wife able to assist as needed, has ramp for home entry, able to scoot to w/c and propel on his own if needed  Pertinent Vitals/Pain Pain Score: 2  Pain Location: R leg Pain Descriptors / Indicators: Operative site guarding;Grimacing Pain Intervention(s): Monitored during session;Repositioned    Home Living                      Prior Function            PT Goals (current goals can now be found in the care plan section) Progress towards PT goals: Progressing toward goals    Frequency    Min 3X/week      PT Plan Current  plan remains appropriate    Co-evaluation              AM-PAC PT "6 Clicks" Mobility   Outcome Measure  Help needed turning from your back to your side while in a flat bed without using bedrails?: None Help needed moving from lying on your back to sitting on the side of a flat bed without using bedrails?: A Little Help needed moving to and from a bed to a chair (including a wheelchair)?: None Help needed standing up from a chair using your arms (e.g., wheelchair or bedside chair)?: A Little Help needed to walk in hospital room?: A Little Help needed climbing 3-5 steps with a railing? : A Lot 6 Click Score: 19    End of Session Equipment Utilized During Treatment: Gait belt Activity Tolerance: Patient tolerated treatment well Patient left: in bed;with call bell/phone within reach Nurse Communication: Other (comment)(okay for dc) PT Visit Diagnosis: Unsteadiness on feet (R26.81);Pain Pain - Right/Left: Right Pain - part of body: Leg     Time: 0926-0949 PT Time Calculation (min) (ACUTE ONLY): 23 min  Charges:  $Gait Training: 8-22 mins $Wheel Chair Management: 8-22 mins                     Sheran Lawless, [facility] [phone] 11/17/2019    Elray Mcgregor 11/17/2019, 11:01 AM

## 2019-11-17 NOTE — Progress Notes (Signed)
Plan of care reviewed. Pt's progressing. Stated that he is ready to go home.  Remained afebrile, vital signs stable. Sinus rhythm on monitor , HR 60a-70s, BP 125/73- 143/ 56 mmHg.  No immediate distress noted.   Pain tolerated well, he refused to take any pain med tonight.  Surgical wound on right leg had negative for hematoma, no drainage. Got good signals on PT and DP pulse.   He had lot of food his significant other brought from outside. He had been eating those food tonight for preventing hypoglycemia. No insulin pump given to night. Pt stated he will give himself insulin if his CBG above 200 mg/dl.   Results for [name], [name] (MRN [id number]) as of 11/17/2019 04:50   Ref. Range 11/16/2019 21:12 11/17/2019 01:59  Glucose-Capillary Latest Ref Range: 70 - 99 mg/dL 799 (H) 872 (H)    Will continue to monitor.  Filiberto Pinks, RN

## 2019-11-17 NOTE — Progress Notes (Signed)
  Progress Note    11/17/2019 6:53 AM 2 Days Post-Op  Subjective: Postop pain improving daily.  He is voiding spontaneously.  Tolerating diet.  He is complaining of some hoarseness secondary to intubation.  Appreciate diabetes coordinator securing insulin pump and recommendations.  Vitals:   11/17/19 0040 11/17/19 0630  BP: 125/73 128/62  Pulse: 77 70  Resp: 18 20  Temp: 99.5 F (37.5 C) 99 F (37.2 C)  SpO2: 94% 96%    Physical Exam: Cardiac: Rate rhythm regular with occasional skipped beat Lungs: Clear to auscultation bilaterally Incisions: Right groin and lower extremity incisions are all well approximated without bleeding or hematoma.  No drainage. Extremities: Right foot is warm and well-perfused.  Positive Doppler signals dorsalis pedis, peroneal and posterior tibial.  Dime sized right great toe ulcer is dry and without signs of inflammation/infection Abdomen: Soft and nondistended  CBC    Component Value Date/Time   WBC 8.1 11/16/2019 0248   RBC 2.85 (L) 11/16/2019 0248   HGB 9.2 (L) 11/16/2019 0248   HCT 27.5 (L) 11/16/2019 0248   PLT 262 11/16/2019 0248   MCV 96.5 11/16/2019 0248   MCH 32.3 11/16/2019 0248   MCHC 33.5 11/16/2019 0248   RDW 13.2 11/16/2019 0248   LYMPHSABS 1.6 03/02/2016 1530   MONOABS 1.6 (H) 03/02/2016 1530   EOSABS 0.0 03/02/2016 1530   BASOSABS 0.0 03/02/2016 1530    BMET    Component Value Date/Time   NA 136 11/16/2019 0248   NA 141 12/17/2015 0000   K 4.2 11/16/2019 0248   CL 108 11/16/2019 0248   CO2 22 11/16/2019 0248   GLUCOSE 98 11/16/2019 0248   BUN 15 11/16/2019 0248   BUN 17 12/17/2015 0000   CREATININE 0.87 11/16/2019 0248   CALCIUM 7.8 (L) 11/16/2019 0248   GFRNONAA >60 11/16/2019 0248   GFRAA >60 11/16/2019 0248     Intake/Output Summary (Last 24 hours) at 11/17/2019 0653 Last data filed at 11/17/2019 0600 Gross per 24 hour  Intake 2243.17 ml  Output 300 ml  Net 1943.17 ml    HOSPITAL  MEDICATIONS Scheduled Meds: . amLODipine  5 mg Oral Daily  . aspirin EC  81 mg Oral Daily  . Chlorhexidine Gluconate Cloth  6 each Topical Daily  . docusate sodium  100 mg Oral Daily  . folic acid  1 mg Oral Daily  . heparin  5,000 Units Subcutaneous Q8H  . insulin pump   Subcutaneous TID WC, HS, 0200  . metoprolol succinate  25 mg Oral Daily  . pantoprazole  40 mg Oral Daily  . ramipril  10 mg Oral Daily  . rosuvastatin  10 mg Oral Daily   Continuous Infusions: . sodium chloride    . sodium chloride 75 mL/hr at 11/17/19 0133  . magnesium sulfate bolus IVPB     PRN Meds:.sodium chloride, acetaminophen, alum & mag hydroxide-simeth, baclofen, bisacodyl, calcium carbonate, guaiFENesin-dextromethorphan, hydrALAZINE, labetalol, magnesium sulfate bolus IVPB, morphine injection, ondansetron (ZOFRAN) IV, oxyCODONE, phenol, polyethylene glycol, potassium chloride  Assessment:  69 y.o. male is s/p:  Right femoral to below-knee popliteal bypass with ipsilateral translocated saphenous vein graft.  Vital signs are stable.  Pain controlled.   2 Days Post-Op  Plan: -Discharge home -DVT prophylaxis: Heparin subcutaneously   Wendi Maya, PA-C [facility] [phone] 11/17/2019  6:53 AM

## 2019-11-18 NOTE — Discharge Summary (Signed)
Discharge Summary     [name] [birth year] 69 y.o. male  [id number]  Admission Date: 11/14/2019  Discharge Date: 11/17/2019 Physician: Dr. Myra Gianotti  Admission Diagnosis: PAD (peripheral artery disease) (HCC) [I73.9]  HPI:   This is a 69 y.o. male history of occlusive peripheral vascular disease who was evaluated in the office with complaints of right great toe diabetic foot ulcer.  He had below normal toe pressures and was advised regarding further work-up to include angiography and possible intervention or revascularization.  On 11/14/2019, he underwent abdominal aortogram with right lower extremity runoff with the following findings:  Aortogram: No significant renal artery stenosis was identified.  The infrarenal abdominal aorta body pain.  Bilateral common and external iliac arteries widely patent.   Right Lower Extremity: The right common femoral artery is calcified but patent.  There is diffuse disease at the origin of the profunda femoral artery as well as its proximal branches.  The superficial femoral artery is occluded at its origin with reconstitution at the level of the patella.  The popliteal artery is patent throughout its course.  There is three-vessel runoff to the ankle.  There is diffuse atherosclerotic vascular changes out on the foot.  Intervention was attempted but aborted after multiple failed attempts to reenter the popliteal space.  The patient was then advised regarding need for surgical revascularization.  Hospital Course:  The patient was taken to the operating room on 11/15/2019 and underwent right femoral to below-knee popliteal artery bypass graft with ipsilateral translocated saphenous vein.  Extensive endarterectomy of the right distal external iliac common femoral and profundofemoral arteries.  The patient tolerated procedure well was taken to the recovery room in satisfactory condition.  On postoperative day 1, his vital signs were stable, he was  afebrile and had brisk DP and PT Doppler signals.  The patient has a history of insulin-dependent diabetes mellitus and diabetic coordinator was consulted regarding his insulin pump.  Physical therapy assessment was completed. He was voiding spontaneously.  On postoperative day two, his incisions were healing without signs of infection and his foot was well perfused. Hemoglobin stable. Ambulated with PT.  Post-op ABI: ABI Findings:  +--------+------------------+-----+----------+--------+   Right   Rt Pressure (mmHg) Index Waveform  Comment    +--------+------------------+-----+----------+--------+   Brachial 148             triphasic         +--------+------------------+-----+----------+--------+   PTA    171         1.16  monophasic        +--------+------------------+-----+----------+--------+   DP    124         0.84  biphasic         +--------+------------------+-----+----------+--------+   +--------+------------------+-----+---------+-------+   Left   Lt Pressure (mmHg) Index Waveform  Comment   +--------+------------------+-----+---------+-------+   Brachial 142             triphasic        +--------+------------------+-----+---------+-------+   PTA                        BKA     +--------+------------------+-----+---------+-------+   DP                        BKA     +--------+------------------+-----+---------+-------+   +-------+-----------+-----------+------------+------------+   ABI/TBI Today's ABI Today's TBI Previous ABI Previous TBI   +-------+-----------+-----------+------------+------------+   Right  1.16                            +-------+-----------+-----------+------------+------------+  Left   bka                            +-------+-----------+-----------+------------+------------+    Summary:  Right: Resting right  ankle-brachial index is within normal range. No  evidence of significant right lower extremity arterial disease.      Findings: Extensive plaque within the common femoral artery which necessitated endarterectomy which extended up into the distal external iliac artery and went down to the origin of the profundofemoral artery.  The patient had an adequate 3-4 mm vein.  The below-knee popliteal artery was calcified but had a widely patent lumen.  CBC    Component Value Date/Time   WBC 8.1 11/16/2019 0248   RBC 2.85 (L) 11/16/2019 0248   HGB 9.2 (L) 11/16/2019 0248   HCT 27.5 (L) 11/16/2019 0248   PLT 262 11/16/2019 0248   MCV 96.5 11/16/2019 0248   MCH 32.3 11/16/2019 0248   MCHC 33.5 11/16/2019 0248   RDW 13.2 11/16/2019 0248   LYMPHSABS 1.6 03/02/2016 1530   MONOABS 1.6 (H) 03/02/2016 1530   EOSABS 0.0 03/02/2016 1530   BASOSABS 0.0 03/02/2016 1530    BMET    Component Value Date/Time   NA 136 11/16/2019 0248   NA 141 12/17/2015 0000   K 4.2 11/16/2019 0248   CL 108 11/16/2019 0248   CO2 22 11/16/2019 0248   GLUCOSE 98 11/16/2019 0248   BUN 15 11/16/2019 0248   BUN 17 12/17/2015 0000   CREATININE 0.87 11/16/2019 0248   CALCIUM 7.8 (L) 11/16/2019 0248   GFRNONAA >60 11/16/2019 0248   GFRAA >60 11/16/2019 0248       Discharge Diagnosis:  PAD (peripheral artery disease) (HCC) [I73.9]  Secondary Diagnosis: Patient Active Problem List   Diagnosis Date Noted   Below knee amputation status 12/06/2015   PAD (peripheral artery disease) (HCC) 10/24/2015   Diabetes mellitus with peripheral artery disease (HCC) 07/30/2014   Claudication in peripheral vascular disease (HCC) 07/30/2014   IDDM (insulin dependent diabetes mellitus) 01/23/2013   CAD (coronary artery disease) 04/15/2011   Hyperlipidemia 04/15/2011   Past Medical History:  Diagnosis Date   Cigarette smoker    Coronary artery disease    Diabetes mellitus    type 1  x 50 yrs   Diabetic  retinopathy (HCC)    Dyslipidemia    Hypertension    Peripheral vascular disease (HCC)      Allergies as of 11/17/2019      Reactions   Simvastatin Other (See Comments)   MYALGIAS, MUSCLE WEAKNESS      Medication List    STOP taking these medications   cilostazol 100 MG tablet Commonly known as: Pletal     TAKE these medications   acetaminophen 500 MG tablet Commonly known as: TYLENOL Take 1,000 mg by mouth 2 (two) times daily as needed (pain).   amLODipine 5 MG tablet Commonly known as: NORVASC TAKE 1 TABLET BY MOUTH  DAILY   aspirin EC 81 MG tablet Take 243 mg by mouth daily.   baclofen 20 MG tablet Commonly known as: LIORESAL Take 20 mg by mouth daily as needed (for leg cramps). Reported on 12/05/2015   calcium carbonate 500 MG chewable tablet Commonly known as: TUMS - dosed in mg elemental calcium Chew 2 tablets by mouth every 6 (six) hours as needed for indigestion or heartburn.   folic acid 800 MCG tablet Commonly known as: FOLVITE Take 1,600 mcg  by mouth daily.   HumaLOG 100 UNIT/ML injection Generic drug: insulin lispro PUMP   metoprolol succinate 25 MG 24 hr tablet Commonly known as: TOPROL-XL Take 1 tablet (25 mg total) by mouth daily.   MULTIVITAMIN GUMMIES ADULT PO Take 2 tablets by mouth daily.   NovoLOG 100 UNIT/ML injection Generic drug: insulin aspart Inject into the skin continuous. Via insulin pump   oxyCODONE 5 MG immediate release tablet Commonly known as: Oxy IR/ROXICODONE Take 1 tablet (5 mg total) by mouth every 6 (six) hours as needed for moderate pain.   ramipril 10 MG tablet Commonly known as: ALTACE Take 10 mg by mouth daily.   rosuvastatin 10 MG tablet Commonly known as: CRESTOR TAKE 1 TABLET BY MOUTH  DAILY   sulfamethoxazole-trimethoprim 800-160 MG tablet Commonly known as: BACTRIM DS Take 1 tablet by mouth 2 (two) times daily.       Discharge Instructions: [facility] Discharge instructions Lower Extremity Bypass Surgery  Please refer to the following instruction for your post-procedure care. Your surgeon or physician assistant will discuss any changes with you.  Activity  You are encouraged to walk as much as you can. You can slowly return to normal activities during the month after your surgery. Avoid strenuous activity and heavy lifting until your doctor tells you it's OK. Avoid activities such as vacuuming or swinging a golf club. Do not drive until your doctor give the OK and you are no longer taking prescription pain medications. It is also normal to have difficulty with sleep habits, eating and bowel movement after surgery. These will go away with time.  Bathing/Showering  You may shower after you go home. Do not soak in a bathtub, hot tub, or swim until the incision heals completely.  Incision Care  Clean your incision with mild soap and water. Shower every day. Pat the area dry with a clean towel. You do not need a bandage unless otherwise instructed. Do not apply any ointments or creams to your incision. If you have open wounds you will be instructed how to care for them or a visiting nurse may be arranged for you. If you have staples or sutures along your incision they will be removed at your post-op appointment. You may have skin glue on your incision. Do not peel it off. It will come off on its own in about one week.  Wash the groin wound with soap and water daily and pat dry. (No tub bath-only shower)  Then put a dry gauze or washcloth in the groin to keep this area dry to help prevent wound infection.  Do this daily and as needed.  Do not use Vaseline or neosporin on your incisions.  Only use soap and water on your incisions and then protect and keep dry.  Diet  Resume your normal diet. There are no special food restrictions following this procedure. A low fat/ low cholesterol diet is recommended for all patients with vascular  disease. In order to heal from your surgery, it is CRITICAL to get adequate nutrition. Your body requires vitamins, minerals, and protein. Vegetables are the best source of vitamins and minerals. Vegetables also provide the perfect balance of protein. Processed food has little nutritional value, so try to avoid this.  Medications  Resume taking all your medications unless your doctor or Physician Assistant tells you not to. If your incision is causing pain, you may take over-the-counter pain relievers such as acetaminophen (Tylenol). If you were prescribed  a stronger pain medication, please aware these medication can cause nausea and constipation. Prevent nausea by taking the medication with a snack or meal. Avoid constipation by drinking plenty of fluids and eating foods with high amount of fiber, such as fruits, vegetables, and grains. Take Colace 100 mg (an over-the-counter stool softener) twice a day as needed for constipation.  Do not take Tylenol if you are taking prescription pain medications.  Follow Up  Our office will schedule a follow up appointment 2-3 weeks following discharge.  Please call us immediately for any of the following conditions  Severe or worsening pain in your legs or feet while at rest or while walking Increase pain, redness, warmth, or drainage (pus) from your incision site(s)  Fever of 101 degree or higher  The swelling in your leg with the bypass suddenly worsens and becomes more painful than when you were in the hospital  If you have been instructed to feel your graft pulse then you should do so every day. If you can no longer feel this pulse, call the office immediately. Not all patients are given this instruction.   Leg swelling is common after leg bypass surgery.  The swelling should improve over a few months following surgery. To improve the swelling, you may elevate your legs above the level of your heart while you are sitting or resting. Your surgeon or  physician assistant may ask you to apply an ACE wrap or wear compression (TED) stockings to help to reduce swelling.  Reduce your risk of vascular disease  Stop smoking. If you would like help call [facility] at 1-800-QUIT-NOW (515-122-8490) or [place] at [phone].   Manage your cholesterol  Maintain a desired weight  Control your diabetes weight  Control your diabetes  Keep your blood pressure down   If you have any questions, please call the office at [phone]   Prescriptions given:  Roxicet #30 No Refill   Disposition: Home  Patient's condition: is Excellent  Follow up: 1. Dr. Dr. Myra Gianotti in 2 weeks   Wendi Maya, PA-C [facility] [phone] 11/18/2019  7:35 AM  - For VQI Registry use ---   Post-op:  Wound infection: No  Graft infection: No  Transfusion: No    If yes, 0 units given New Arrhythmia: No Ipsilateral amputation: No, [ ]  Minor, [ ]  BKA, [ ]  AKA Discharge patency: [x ] Primary, [ ]  Primary assisted, [ ]  Secondary, [ ]  Occluded Patency judged by: [x ] Doppler only, [ ]  Palpable graft pulse, []  Palpable distal pulse, [ ]  ABI inc. > 0.15, [ ]  Duplex Discharge ABI: R .84, L BKA D/C Ambulatory Status: Ambulatory with Assistance  Complications: MI: No, [ ]  Troponin only, [ ]  EKG or Clinical CHF: No Resp failure:No, [ ]  Pneumonia, [ ]  Ventilator Chg in renal function: No, [ ]  Inc. Cr > 0.5, [ ]  Temp. Dialysis,  [ ]  Permanent dialysis Stroke: No, [ ]  Minor, [ ]  Major Return to OR: No  Reason for return to OR: [ ]  Bleeding, [ ]  Infection, [ ]  Thrombosis, [ ]  Revision  Discharge medications: Statin use:  yes ASA use:  yes Plavix use:  no Beta blocker use: yes CCB use:  Yes ACEI use:   yes ARB use:  no Coumadin use: no

## 2019-11-27 ENCOUNTER — Ambulatory Visit: Payer: Medicare Other | Admitting: Physician Assistant

## 2019-11-27 ENCOUNTER — Encounter: Payer: Self-pay | Admitting: Orthopedic Surgery

## 2019-11-27 ENCOUNTER — Other Ambulatory Visit: Payer: Self-pay

## 2019-11-27 VITALS — Ht 71.0 in | Wt 156.0 lb

## 2019-11-27 DIAGNOSIS — I739 Peripheral vascular disease, unspecified: Secondary | ICD-10-CM | POA: Diagnosis not present

## 2019-11-27 NOTE — Progress Notes (Addendum)
Office Visit Note   Patient: [name]           Date of Birth: Nov 23, 1950           MRN: [id number] Visit Date: 11/27/2019              Requested by: [name], [address] [place],  [place] [address] PCP: [name], MD  Chief Complaint  Patient presents with  . Right Great Toe - Wound Check      HPI: Patient is in follow-up today for his right great toe ulcer on the plantar surface of his distal great toe.  Since his last visit he has undergone revascularization of his right lower extremity.  He denies any drainage foul odor erythema fever or chills  Assessment & Plan: Visit Diagnoses: No diagnosis found.  Plan: He should continue to try to offload in this area there is no necrosis no drainage he will follow-up in 4 weeks or sooner if he notices any of these changes  Follow-Up Instructions: No follow-ups on file.   Ortho Exam  Patient is alert, oriented, no adenopathy, well-dressed, normal affect, normal respiratory effort. He has biphasic dorsalis pedis and posterior tibial tendons pulses are audible.  Mild to moderate soft tissue swelling no erythema toe ulcer shows no necrosis no drainage no foul odor there is some healthy granulation tissue. No cellulitis  Imaging: No results found. No images are attached to the encounter.  Labs: Lab Results  Component Value Date   HGBA1C 8.0 (H) 11/14/2019   HGBA1C 9.1 (H) 10/24/2015     Lab Results  Component Value Date   ALBUMIN 3.5 10/22/2015   ALBUMIN 3.9 09/01/2014    No results found for: MG No results found for: VD25OH  No results found for: PREALBUMIN CBC EXTENDED Latest Ref Rng & Units 11/16/2019 11/15/2019 11/14/2019  WBC 4.0 - 10.5 K/uL 8.1 8.2 -  RBC 4.22 - 5.81 MIL/uL 2.85(L) 3.49(L) -  HGB 13.0 - 17.0 g/dL 9.2(L) 11.5(L) 13.6  HCT 39.0 - 52.0 % 27.5(L) 33.5(L) 40.0  PLT 150 - 400 K/uL 262 350 -  NEUTROABS 1.7 - 7.7 K/uL - - -  LYMPHSABS 0.7 - 4.0 K/uL - - -     Body mass index is  21.76 kg/m.  Orders:  No orders of the defined types were placed in this encounter.  No orders of the defined types were placed in this encounter.    Procedures: No procedures performed  Clinical Data: No additional findings.  ROS:  All other systems negative, except as noted in the HPI. Review of Systems  Objective: Vital Signs: Ht 5\' 11"  (1.803 m)   Wt 156 lb (70.8 kg)   BMI 21.76 kg/m   Specialty Comments:  No specialty comments available.  PMFS History: Patient Active Problem List   Diagnosis Date Noted  . Below knee amputation status 12/06/2015  . PAD (peripheral artery disease) ([place]) 10/24/2015  . Diabetes mellitus with peripheral artery disease ([place]) 07/30/2014  . Claudication in peripheral vascular disease ([place]) 07/30/2014  . IDDM (insulin dependent diabetes mellitus) 01/23/2013  . CAD (coronary artery disease) 04/15/2011  . Hyperlipidemia 04/15/2011   Past Medical History:  Diagnosis Date  . Cigarette smoker   . Coronary artery disease   . Diabetes mellitus    type 1  x 50 yrs  . Diabetic retinopathy ([place])   . Dyslipidemia   . Hypertension   . Peripheral vascular disease ([place])  Family History  Problem Relation Age of Onset  . Coronary artery disease Mother     Past Surgical History:  Procedure Laterality Date  . ABDOMINAL ANGIOGRAM N/A 05/29/2014   Procedure: ABDOMINAL ANGIOGRAM;  Surgeon: Pamella Pert, MD;  Location: [facility] CATH LAB;  Service: Cardiovascular;  Laterality: N/A;  . ABDOMINAL AORTOGRAM W/LOWER EXTREMITY N/A 11/14/2019   Procedure: ABDOMINAL AORTOGRAM W/LOWER EXTREMITY;  Surgeon: Nada Libman, MD;  Location: [facility] INVASIVE CV LAB;  Service: Cardiovascular;  Laterality: N/A;  . AMPUTATION Left 10/25/2015   Procedure: Left Foot 5th Ray Amputation;  Surgeon: Nadara Mustard, MD;  Location: [facility] OR;  Service: Orthopedics;  Laterality: Left;  . AMPUTATION Left 12/06/2015   Procedure: AMPUTATION BELOW KNEE;  Surgeon: Nadara Mustard, MD;   Location: [facility] OR;  Service: Orthopedics;  Laterality: Left;  . CARDIAC CATHETERIZATION     EF is 55-60% and no wall motion abnormalities (long long time ago)  . FEMORAL-POPLITEAL BYPASS GRAFT Left 10/24/2015   Procedure: BYPASS GRAFT FEMORAL below knee POPLITEAL ARTERY with Left Saphenous Vein;  Surgeon: Nada Libman, MD;  Location: [facility] OR;  Service: Vascular;  Laterality: Left;  . FEMORAL-POPLITEAL BYPASS GRAFT Right 11/15/2019   Procedure: BYPASS GRAFT FEMORAL-POPLITEAL ARTERY using Right Leg Greater Saphenous Vein;  Surgeon: Nada Libman, MD;  Location: [facility] OR;  Service: Vascular;  Laterality: Right;  . FRACTURE SURGERY     left arm "many yrs ago"  . LOWER EXTREMITY ANGIOGRAM N/A 01/23/2014   Procedure: LOWER EXTREMITY ANGIOGRAM;  Surgeon: Pamella Pert, MD;  Location[facility] CATH LAB;  Service: Cardiovascular;  Laterality: N/A;  . LOWER EXTREMITY ANGIOGRAM N/A 07/31/2014   Procedure: LOWER EXTREMITY ANGIOGRAM;  Surgeon: Pamella Pert, MD;  Location: [facility] CATH LAB;  Service: Cardiovascular;  Laterality: N/A;  . LOWER EXTREMITY ANGIOGRAM Left 10/11/2015   Procedure: Lower Extremity Angiogram;  Surgeon: Nada Libman, MD;  Location: [facility] INVASIVE CV LAB;  Service: Cardiovascular;  Laterality: Left;  . PERIPHERAL VASCULAR BALLOON ANGIOPLASTY  11/14/2019   Procedure: PERIPHERAL VASCULAR BALLOON ANGIOPLASTY;  Surgeon: Nada Libman, MD;  Location: [facility] INVASIVE CV LAB;  Service: Cardiovascular;;  . PERIPHERAL VASCULAR CATHETERIZATION Left 10/11/2015   Procedure: Peripheral Vascular Balloon Angioplasty;  Surgeon: Nada Libman, MD;  Location: [facility] INVASIVE CV LAB;  Service: Cardiovascular;  Laterality: Left;  sfa failed unable to cross occluded sfa  . PERIPHERAL VASCULAR CATHETERIZATION N/A 10/11/2015   Procedure: Abdominal Aortogram;  Surgeon: Nada Libman, MD;  Location: [facility] INVASIVE CV LAB;  Service: Cardiovascular;  Laterality: N/A;  . STUMP REVISION Left 03/02/2016   Procedure: Revision Left  Below Knee Amputation;  Surgeon: Nadara Mustard, MD;  Location: [facility] OR;  Service: Orthopedics;  Laterality: Left;   Social History   Occupational History  . Not on file  Tobacco Use  . Smoking status: Current Every Day Smoker    Packs/day: 1.00    Years: 30.00    Pack years: 30.00    Types: Cigarettes  . Smokeless tobacco: Never Used  Substance and Sexual Activity  . Alcohol use: Yes    Comment: on occasion wine  . Drug use: No  . Sexual activity: Not on file

## 2019-12-08 ENCOUNTER — Telehealth (HOSPITAL_COMMUNITY): Payer: Self-pay

## 2019-12-08 NOTE — Telephone Encounter (Signed)

## 2019-12-11 ENCOUNTER — Other Ambulatory Visit: Payer: Self-pay

## 2019-12-11 ENCOUNTER — Encounter: Payer: Self-pay | Admitting: Surgery

## 2019-12-11 ENCOUNTER — Ambulatory Visit (INDEPENDENT_AMBULATORY_CARE_PROVIDER_SITE_OTHER): Payer: Self-pay | Admitting: Surgery

## 2019-12-11 VITALS — BP 159/75 | HR 63 | Temp 97.9°F | Resp 20 | Ht 71.0 in | Wt 156.0 lb

## 2019-12-11 DIAGNOSIS — I7025 Atherosclerosis of native arteries of other extremities with ulceration: Secondary | ICD-10-CM

## 2019-12-11 NOTE — Progress Notes (Signed)
Patient name: [name] MRN: [id number] DOB: [birth year] Sex: male  REASON FOR VISIT:     post op  HISTORY OF PRESENT ILLNESS:   [name] is a 69 y.o. male who is status post right femoral to below-knee popliteal artery bypass graft with ipsilateral translocated saphenous vein and extensive endarterectomy of the right distal external iliac, common femoral, and profundofemoral artery on 11-15-2019 for a right toe ulcer.  His post operative course was uncomplicated.  Post op ABI was 1.16  The patient continues to smoke.  He takes a statin for hypercholesterolemia.  He is medically managed for hypertension.  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  He also suffers from coronary artery disease.  He has seen Dr. Jacinto Halim  CURRENT MEDICATIONS:    Current Outpatient Medications  Medication Sig Dispense Refill  . acetaminophen (TYLENOL) 500 MG tablet Take 1,000 mg by mouth 2 (two) times daily as needed (pain).     Marland Kitchen amLODipine (NORVASC) 5 MG tablet TAKE 1 TABLET BY MOUTH  DAILY (Patient taking differently: Take 5 mg by mouth daily. ) 90 tablet 3  . aspirin EC 81 MG tablet Take 243 mg by mouth daily.     . baclofen (LIORESAL) 20 MG tablet Take 20 mg by mouth daily as needed (for leg cramps). Reported on 12/05/2015  0  . calcium carbonate (TUMS - DOSED IN MG ELEMENTAL CALCIUM) 500 MG chewable tablet Chew 2 tablets by mouth every 6 (six) hours as needed for indigestion or heartburn.    . folic acid (FOLVITE) 800 MCG tablet Take 1,600 mcg by mouth daily.     Marland Kitchen HUMALOG 100 UNIT/ML injection PUMP    . metoprolol succinate (TOPROL-XL) 25 MG 24 hr tablet Take 1 tablet (25 mg total) by mouth daily. 30 tablet 1  . Multiple Vitamins-Minerals (MULTIVITAMIN GUMMIES ADULT PO) Take 2 tablets by mouth daily.     Marland Kitchen NOVOLOG 100 UNIT/ML injection Inject into the skin continuous. Via insulin pump  0  . ramipril (ALTACE) 10 MG tablet Take 10 mg by mouth  daily.      . rosuvastatin (CRESTOR) 10 MG tablet TAKE 1 TABLET BY MOUTH  DAILY (Patient taking differently: Take 10 mg by mouth daily. ) 90 tablet 3  . oxyCODONE (OXY IR/ROXICODONE) 5 MG immediate release tablet Take 1 tablet (5 mg total) by mouth every 6 (six) hours as needed for moderate pain. (Patient not taking: Reported on 12/11/2019) 30 tablet 0  . sulfamethoxazole-trimethoprim (BACTRIM DS) 800-160 MG tablet Take 1 tablet by mouth 2 (two) times daily.     No current facility-administered medications for this visit.    REVIEW OF SYSTEMS:   [X]  denotes positive finding, [ ]  denotes negative finding Cardiac  Comments:  Chest pain or chest pressure:    Shortness of breath upon exertion:    Short of breath when lying flat:    Irregular heart rhythm:    Constitutional    Fever or chills:      PHYSICAL EXAM:   Vitals:   12/11/19 0824  BP: (!) 159/75  Pulse: 63  Resp: 20  Temp: 97.9 F (36.6 C)  SpO2: 96%  Weight: 156 lb (70.8 kg)  Height: 5\' 11"  (1.803 m)    GENERAL: The patient is a well-nourished male, in no acute distress. The vital signs are documented above. CARDIOVASCULAR: There is a regular rate and rhythm. PULMONARY: Non-labored respirations     STUDIES:   Post  op ABI 1.16   MEDICAL ISSUES:   S/p right fem pop for toe ulcer.  All incisions are healing nicely.  He is to see Dr. Sharol Given for his ulcer.  He will return in 3 months for duplex and ABI of bypass  Annamarie Major, IV, MD, FACS [facility] [phone] Pager [phone]

## 2019-12-13 ENCOUNTER — Other Ambulatory Visit: Payer: Self-pay | Admitting: *Deleted

## 2019-12-13 DIAGNOSIS — I739 Peripheral vascular disease, unspecified: Secondary | ICD-10-CM

## 2019-12-25 ENCOUNTER — Ambulatory Visit: Payer: Medicare Other | Admitting: Orthopedic Surgery

## 2019-12-26 ENCOUNTER — Other Ambulatory Visit: Payer: Self-pay

## 2019-12-26 ENCOUNTER — Ambulatory Visit: Payer: Medicare Other | Admitting: Family

## 2019-12-26 ENCOUNTER — Encounter: Payer: Self-pay | Admitting: Family

## 2019-12-26 VITALS — Ht 71.0 in | Wt 156.0 lb

## 2019-12-26 DIAGNOSIS — L97511 Non-pressure chronic ulcer of other part of right foot limited to breakdown of skin: Secondary | ICD-10-CM

## 2019-12-26 DIAGNOSIS — B351 Tinea unguium: Secondary | ICD-10-CM | POA: Diagnosis not present

## 2019-12-26 NOTE — Progress Notes (Signed)
Office Visit Note   Patient: [name]           Date of Birth: Jan 03, 1951           MRN: [id number] Visit Date: 12/26/2019              Requested by: [name], MD [address] [place],  [place] [address] PCP: [name], MD  Chief Complaint  Patient presents with  . Right Foot - Follow-up      HPI: Patient is a 69 year old gentleman seen today for evaluation of right great toe.  He is post revascularization of the right lower extremity.  He states overall that the pain he has been having in his right lower extremity due to claudication has completely resolved he is able to do his ADLs without pain.  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  He has been doing antibacterial ointment and a Band-Aid daily.  However he is not offloading the ulcer.  He does try to walk on the lateral column of his foot  Assessment & Plan: Visit Diagnoses:  1. Ulcer of toe of right foot, limited to breakdown of skin (HCC)   2. Onychomycosis     Plan: Provided several felt pressure relieving pads for her shoe wear to offload his ulcer discussed this is the best thing he can do to help this wound heal to get pressure off of the wound.  He will continue with his daily wound cleansing and antibacterial ointment dressing changes.  Discussed return precautions.  Follow-Up Instructions: Return in about 3 weeks (around 01/16/2020).   Ortho Exam  Patient is alert, oriented, no adenopathy, well-dressed, normal affect, normal respiratory effort. On examination of the right foot he does have a palpable dorsalis pedis pulse.  The great toe is mildly swollen there is no erythema or warmth is tender discolored avulsed easily on exam.  He does have a Wagner grade 1 ulcer to the great toe this is 1 cm in diameter with pink tissue there is no surrounding maceration no odor no active drainage  Imaging: No results found. No images are attached to the  encounter.  Labs: Lab Results  Component Value Date   HGBA1C 8.0 (H) 11/14/2019   HGBA1C 9.1 (H) 10/24/2015     Lab Results  Component Value Date   ALBUMIN 3.5 10/22/2015   ALBUMIN 3.9 09/01/2014    No results found for: MG No results found for: VD25OH  No results found for: PREALBUMIN CBC EXTENDED Latest Ref Rng & Units 11/16/2019 11/15/2019 11/14/2019  WBC 4.0 - 10.5 K/uL 8.1 8.2 -  RBC 4.22 - 5.81 MIL/uL 2.85(L) 3.49(L) -  HGB 13.0 - 17.0 g/dL 8.8(Q) 11.5(L) 13.6  HCT 39.0 - 52.0 % 27.5(L) 33.5(L) 40.0  PLT 150 - 400 K/uL 262 350 -  NEUTROABS 1.7 - 7.7 K/uL - - -  LYMPHSABS 0.7 - 4.0 K/uL - - -     Body mass index is 21.76 kg/m.  Orders:  No orders of the defined types were placed in this encounter.  No orders of the defined types were placed in this encounter.    Procedures: No procedures performed  Clinical Data: No additional findings.  ROS:  All other systems negative, except as noted in the HPI. Review of Systems  Objective: Vital Signs: Ht 5\' 11"  (1.803 m)   Wt 156 lb (70.8 kg)   BMI 21.76 kg/m   Specialty  Comments:  No specialty comments available.  PMFS History: Patient Active Problem List   Diagnosis Date Noted  . Below knee amputation status 12/06/2015  . PAD (peripheral artery disease) ([place]) 10/24/2015  . Diabetes mellitus with peripheral artery disease ([place]) 07/30/2014  . Claudication in peripheral vascular disease ([place]) 07/30/2014  . IDDM (insulin dependent diabetes mellitus) 01/23/2013  . CAD (coronary artery disease) 04/15/2011  . Hyperlipidemia 04/15/2011   Past Medical History:  Diagnosis Date  . Cigarette smoker   . Coronary artery disease   . Diabetes mellitus    type 1  x 50 yrs  . Diabetic retinopathy ([place])   . Dyslipidemia   . Hypertension   . Peripheral vascular disease ([place])     Family History  Problem Relation Age of Onset  . Coronary artery disease Mother     Past Surgical History:  Procedure Laterality  Date  . ABDOMINAL ANGIOGRAM N/A 05/29/2014   Procedure: ABDOMINAL ANGIOGRAM;  Surgeon: Laverda Page, MD;  Location: [facility] CATH LAB;  Service: Cardiovascular;  Laterality: N/A;  . ABDOMINAL AORTOGRAM W/LOWER EXTREMITY N/A 11/14/2019   Procedure: ABDOMINAL AORTOGRAM W/LOWER EXTREMITY;  Surgeon: Serafina Mitchell, MD;  Location: [place] CV LAB;  Service: Cardiovascular;  Laterality: N/A;  . AMPUTATION Left 10/25/2015   Procedure: Left Foot 5th Ray Amputation;  Surgeon: Newt Minion, MD;  Location: [place];  Service: Orthopedics;  Laterality: Left;  . AMPUTATION Left 12/06/2015   Procedure: AMPUTATION BELOW KNEE;  Surgeon: Newt Minion, MD;  Location: [facility];  Service: Orthopedics;  Laterality: Left;  . CARDIAC CATHETERIZATION     EF is 55-60% and no wall motion abnormalities (long long time ago)  . FEMORAL-POPLITEAL BYPASS GRAFT Left 10/24/2015   Procedure: BYPASS GRAFT FEMORAL below knee POPLITEAL ARTERY with Left Saphenous Vein;  Surgeon: Serafina Mitchell, MD;  Location: [place];  Service: Vascular;  Laterality: Left;  . FEMORAL-POPLITEAL BYPASS GRAFT Right 11/15/2019   Procedure: BYPASS GRAFT FEMORAL-POPLITEAL ARTERY using Right Leg Greater Saphenous Vein;  Surgeon: Serafina Mitchell, MD;  Location: [facility];  Service: Vascular;  Laterality: Right;  . FRACTURE SURGERY     left arm "many yrs ago"  . LOWER EXTREMITY ANGIOGRAM N/A 01/23/2014   Procedure: LOWER EXTREMITY ANGIOGRAM;  Surgeon: Laverda Page, MD;  Location: [facility] CATH LAB;  Service: Cardiovascular;  Laterality: N/A;  . LOWER EXTREMITY ANGIOGRAM N/A 07/31/2014   Procedure: LOWER EXTREMITY ANGIOGRAM;  Surgeon: Laverda Page, MD;  Location: [facility] CATH LAB;  Service: Cardiovascular;  Laterality: N/A;  . LOWER EXTREMITY ANGIOGRAM Left 10/11/2015   Procedure: Lower Extremity Angiogram;  Surgeon: Serafina Mitchell, MD;  Location: [facility] CV LAB;  Service: Cardiovascular;  Laterality: Left;  . PERIPHERAL VASCULAR BALLOON ANGIOPLASTY  11/14/2019    Procedure: PERIPHERAL VASCULAR BALLOON ANGIOPLASTY;  Surgeon: Serafina Mitchell, MD;  Location: [place] CV LAB;  Service: Cardiovascular;;  . PERIPHERAL VASCULAR CATHETERIZATION Left 10/11/2015   Procedure: Peripheral Vascular Balloon Angioplasty;  Surgeon: Serafina Mitchell, MD;  Location: [place] CV LAB;  Service: Cardiovascular;  Laterality: Left;  sfa failed unable to cross occluded sfa  . PERIPHERAL VASCULAR CATHETERIZATION N/A 10/11/2015   Procedure: Abdominal Aortogram;  Surgeon: Serafina Mitchell, MD;  Location: [place] CV LAB;  Service: Cardiovascular;  Laterality: N/A;  . STUMP REVISION Left 03/02/2016   Procedure: Revision Left Below Knee Amputation;  Surgeon: Newt Minion, MD;  Location: [facility];  Service: Orthopedics;  Laterality: Left;  Social History   Occupational History  . Not on file  Tobacco Use  . Smoking status: Current Every Day Smoker    Packs/day: 1.00    Years: 30.00    Pack years: 30.00    Types: Cigarettes  . Smokeless tobacco: Never Used  Substance and Sexual Activity  . Alcohol use: Yes    Comment: on occasion wine  . Drug use: No  . Sexual activity: Not on file

## 2020-01-23 ENCOUNTER — Encounter (HOSPITAL_BASED_OUTPATIENT_CLINIC_OR_DEPARTMENT_OTHER): Payer: Medicare Other | Attending: Physician Assistant | Admitting: Internal Medicine

## 2020-01-23 DIAGNOSIS — E10621 Type 1 diabetes mellitus with foot ulcer: Secondary | ICD-10-CM | POA: Diagnosis not present

## 2020-01-23 DIAGNOSIS — E1042 Type 1 diabetes mellitus with diabetic polyneuropathy: Secondary | ICD-10-CM | POA: Insufficient documentation

## 2020-01-23 DIAGNOSIS — E1051 Type 1 diabetes mellitus with diabetic peripheral angiopathy without gangrene: Secondary | ICD-10-CM | POA: Diagnosis not present

## 2020-01-23 DIAGNOSIS — E7800 Pure hypercholesterolemia, unspecified: Secondary | ICD-10-CM | POA: Diagnosis not present

## 2020-01-23 DIAGNOSIS — Z794 Long term (current) use of insulin: Secondary | ICD-10-CM | POA: Diagnosis not present

## 2020-01-23 DIAGNOSIS — Z89512 Acquired absence of left leg below knee: Secondary | ICD-10-CM | POA: Diagnosis not present

## 2020-01-23 DIAGNOSIS — I2510 Atherosclerotic heart disease of native coronary artery without angina pectoris: Secondary | ICD-10-CM | POA: Diagnosis not present

## 2020-01-23 DIAGNOSIS — L97512 Non-pressure chronic ulcer of other part of right foot with fat layer exposed: Secondary | ICD-10-CM | POA: Diagnosis not present

## 2020-01-23 DIAGNOSIS — E10319 Type 1 diabetes mellitus with unspecified diabetic retinopathy without macular edema: Secondary | ICD-10-CM | POA: Insufficient documentation

## 2020-01-23 DIAGNOSIS — M869 Osteomyelitis, unspecified: Secondary | ICD-10-CM | POA: Insufficient documentation

## 2020-01-23 DIAGNOSIS — I10 Essential (primary) hypertension: Secondary | ICD-10-CM | POA: Diagnosis not present

## 2020-01-23 DIAGNOSIS — E1069 Type 1 diabetes mellitus with other specified complication: Secondary | ICD-10-CM | POA: Diagnosis not present

## 2020-01-23 DIAGNOSIS — L97519 Non-pressure chronic ulcer of other part of right foot with unspecified severity: Secondary | ICD-10-CM | POA: Diagnosis present

## 2020-01-23 NOTE — Progress Notes (Signed)
MICHIAH, MUDRY ([id number]) Visit Report for 01/23/2020 Abuse/Suicide Risk Screen Details Patient Name: Date of Service: [name], [name] 01/23/2020 2:45 PM Medical Record Number: [id number] Patient Account Number: [id number] Date of Birth/Sex: Treating RN: [birth year] (69 y.o. Ernestene Mention Primary Care Jassica Zazueta: [name] Other Clinician: Referring Breion Novacek: Treating Dymir Neeson/Extender: [name] in Treatment: 0 Abuse/Suicide Risk Screen Items Answer ABUSE RISK SCREEN: Has anyone close to you tried to hurt or harm you recentlyo No Do you feel uncomfortable with anyone in your familyo No Has anyone forced you do things that you didnt want to doo No Electronic Signature(s) Signed: 01/23/2020 5:54:40 PM By: Baruch Gouty RN, BSN Entered By: Baruch Gouty on 01/23/2020 15:04:14 -------------------------------------------------------------------------------- Activities of Daily Living Details Patient Name: Date of Service: [name], [name] 01/23/2020 2:45 PM Medical Record Number: [id number] Patient Account Number: [id number] Date of Birth/Sex: Treating RN: 1951/08/05 (69 y.o. Ernestene Mention Primary Care Amma Crear: [name] Other Clinician: Referring Ghali Morissette: Treating Maronda Caison/Extender: [name] in Treatment: 0 Activities of Daily Living Items Answer Activities of Daily Living (Please select one for each item) Drive Automobile Completely Able T Medications ake Completely Able Use T elephone Completely Able Care for Appearance Completely Able Use T oilet Completely Able Bath / Shower Completely Able Dress Self Completely Able Feed Self Completely Able Walk Completely Able Get In / Out Bed Completely Able Housework Completely Able Prepare Meals Completely Able Handle Money Completely Able Shop for Self Completely Able Electronic Signature(s) Signed: 01/23/2020 5:54:40 PM By: Baruch Gouty RN, BSN Entered By:  Baruch Gouty on 01/23/2020 15:04:35 -------------------------------------------------------------------------------- Education Screening Details Patient Name: Date of Service: [name] 01/23/2020 2:45 PM Medical Record Number: [id number] Patient Account Number: [id number] Date of Birth/Sex: Treating RN: 1951/06/11 (69 y.o. Ernestene Mention Primary Care Biddie Sebek: [name] Other Clinician: Referring Needham Biggins: Treating Jennifier Smitherman/Extender: [name] in Treatment: 0 Primary Learner Assessed: Patient Learning Preferences/Education Level/Primary Language Learning Preference: Explanation, Demonstration, Printed Material Highest Education Level: High School Preferred Language: English Cognitive Barrier Language Barrier: No Translator Needed: No Memory Deficit: No Emotional Barrier: No Cultural/Religious Beliefs Affecting Medical Care: No Physical Barrier Impaired Vision: Yes Glasses, reading Impaired Hearing: No Decreased Hand dexterity: No Knowledge/Comprehension Knowledge Level: High Comprehension Level: High Ability to understand written instructions: High Ability to understand verbal instructions: High Motivation Anxiety Level: Calm Cooperation: Cooperative Education Importance: Acknowledges Need Interest in Health Problems: Asks Questions Perception: Coherent Willingness to Engage in Self-Management High Activities: Readiness to Engage in Self-Management High Activities: Electronic Signature(s) Signed: 01/23/2020 5:54:40 PM By: Baruch Gouty RN, BSN Entered By: Baruch Gouty on 01/23/2020 15:05:32 -------------------------------------------------------------------------------- Fall Risk Assessment Details Patient Name: Date of Service: [name] 01/23/2020 2:45 PM Medical Record Number: [id number] Patient Account Number: [id number] Date of Birth/Sex: Treating RN: Jan 11, 1951 (68 y.o. Ernestene Mention Primary Care  Corrie Brannen: [name] Other Clinician: Referring Loralie Malta: Treating Amiri Tritch/Extender: [name] in Treatment: 0 Fall Risk Assessment Items Have you had 2 or more falls in the last 12 monthso 0 No Have you had any fall that resulted in injury in the last 12 monthso 0 No FALLS RISK SCREEN History of falling - immediate or within 3 months 0 No Secondary diagnosis (Do you have 2 or more medical diagnoseso) 0 No Ambulatory aid None/bed rest/wheelchair/nurse 0 Yes Crutches/cane/walker 0 No Furniture 0 No Intravenous therapy Access/Saline/Heparin Lock 0 No Gait/Transferring Normal/ bed rest/ wheelchair 0 Yes Weak (short steps with or without  shuffle, stooped but able to lift head while walking, may seek 0 No support from furniture) Impaired (short steps with shuffle, may have difficulty arising from chair, head down, impaired 0 No balance) Mental Status Oriented to own ability 0 Yes Electronic Signature(s) Signed: 01/23/2020 5:54:40 PM By: Zenaida Deed RN, BSN Entered By: Zenaida Deed on 01/23/2020 15:06:14 -------------------------------------------------------------------------------- Foot Assessment Details Patient Name: Date of Service: [name] 01/23/2020 2:45 PM Medical Record Number: [id number] Patient Account Number: [id number] Date of Birth/Sex: Treating RN: 1951/05/30 (69 y.o. Damaris Schooner Primary Care Onell Mcmath: [name] Other Clinician: Referring Lunell Robart: Treating Ronnie Doo/Extender: Lacretia Leigh in Treatment: 0 Foot Assessment Items Site Locations + = Sensation present, - = Sensation absent, C = Callus, U = Ulcer R = Redness, W = Warmth, M = Maceration, PU = Pre-ulcerative lesion F = Fissure, S = Swelling, D = Dryness Assessment Right: Left: Other Deformity: No No Prior Foot Ulcer: No Yes Prior Amputation: No Yes Charcot Joint: No No Ambulatory Status: Ambulatory Without Help Gait:  Steady Electronic Signature(s) Signed: 01/23/2020 5:54:40 PM By: Zenaida Deed RN, BSN Entered By: Zenaida Deed on 01/23/2020 15:10:21 -------------------------------------------------------------------------------- Nutrition Risk Screening Details Patient Name: Date of Service: [name] 01/23/2020 2:45 PM Medical Record Number: [id number] Patient Account Number: [id number] Date of Birth/Sex: Treating RN: December 18, 1950 (69 y.o. Damaris Schooner Primary Care Raychelle Hudman: [name] Other Clinician: Referring Lauralye Kinn: Treating Mickey Esguerra/Extender: Lacretia Leigh in Treatment: 0 Height (in): 71 Weight (lbs): 152 Body Mass Index (BMI): 21.2 Nutrition Risk Screening Items Score Screening NUTRITION RISK SCREEN: I have an illness or condition that made me change the kind and/or amount of food I eat 0 No I eat fewer than two meals per day 0 No I eat few fruits and vegetables, or milk products 0 No I have three or more drinks of beer, liquor or wine almost every day 0 No I have tooth or mouth problems that make it hard for me to eat 0 No I don't always have enough money to buy the food I need 0 No I eat alone most of the time 0 No I take three or more different prescribed or over-the-counter drugs a day 1 Yes Without wanting to, I have lost or gained 10 pounds in the last six months 0 No I am not always physically able to shop, cook and/or feed myself 0 No Nutrition Protocols Good Risk Protocol 0 No interventions needed Moderate Risk Protocol High Risk Proctocol Risk Level: Good Risk Score: 1 Electronic Signature(s) Signed: 01/23/2020 5:54:40 PM By: Zenaida Deed RN, BSN Entered By: Zenaida Deed on 01/23/2020 15:08:43

## 2020-01-24 NOTE — Progress Notes (Signed)
[name], [name] ([id number]) Visit Report for 01/23/2020 Debridement Details Patient Name: Date of Service: [name], [name] 01/23/2020 2:45 PM Medical Record Number: [id number] Patient Account Number: [id number] Date of Birth/Sex: Treating RN: 12-Apr-1951 (68 y.o. Male) Yevonne PaxEpps, Carrie Primary Care Provider: [name], [name] Other Clinician: Referring Provider: Treating Provider/Extender: Lacretia Leighobson, Marikay Roads Russo, [name] in Treatment: 0 Debridement Performed for Assessment: Wound #1 Right T Great oe Performed By: Physician Maxwell Caulobson, Kern Gingras G., MD Debridement Type: Debridement Severity of Tissue Pre Debridement: Fat layer exposed Level of Consciousness (Pre-procedure): Awake and Alert Pre-procedure Verification/Time Out Yes - 16:01 Taken: Start Time: 16:01 Pain Control: Lidocaine 5% topical ointment T Area Debrided (L x W): otal 0.5 (cm) x 0.3 (cm) = 0.15 (cm) Tissue and other material debrided: Viable, Non-Viable, Slough, Subcutaneous, Skin: Dermis , Skin: Epidermis, Slough Level: Skin/Subcutaneous Tissue Debridement Description: Excisional Instrument: Curette Bleeding: Minimum Hemostasis Achieved: Pressure End Time: 16:03 Procedural Pain: 0 Post Procedural Pain: 0 Response to Treatment: Procedure was tolerated well Level of Consciousness (Post- Awake and Alert procedure): Post Debridement Measurements of Total Wound Length: (cm) 0.5 Width: (cm) 0.3 Depth: (cm) 0.1 Volume: (cm) 0.012 Character of Wound/Ulcer Post Debridement: Improved Severity of Tissue Post Debridement: Fat layer exposed Post Procedure Diagnosis Same as Pre-procedure Electronic Signature(s) Signed: 01/23/2020 5:52:05 PM By: [name], Marypat Kimmet MD Signed: 01/24/2020 4:36:43 PM By: Yevonne PaxEpps, Carrie RN Entered By: [name], Laddie Naeem on 01/23/2020 16:36:43 -------------------------------------------------------------------------------- HPI Details Patient Name: Date of Service: [name], [name] 01/23/2020 2:45 PM Medical  Record Number: [id number] Patient Account Number: [id number] Date of Birth/Sex: Treating RN: 12-Apr-1951 (68 y.o. 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 He is a type I diabetic on an insulin pump diagnosed at age 89. He also has known peripheral neuropathy. He has been dealing with a wound on his right first toe since at least February by review of epic. He was seen by Dr. Lajoyce Cornersuda. Diagnosed with an ischemic wound sent to see Dr. Myra GianottiBrabham him who noted a noncompressible ABI 1.67 and a TBI of 0.25 monophasic waveforms. On 11/15/2019 he underwent a right femoral to below-knee popliteal bypass with an ipsilateral saphenous vein. He also underwent extensive endarterectomy I believe that the external iliac. Postoperative ABI was 1.16 he has been using Neosporin on this and gradually making progress according to his companion. He is offloading this with a hole in the inserts in his work shoes. Per his companion who does the dressings the wound has been getting smaller Past medical history; type 1 diabetes with PAD and PN, history of a left BKA in 2017, coronary artery disease, hypercholesterolemia, hypertension he is a smoker Psychologist, prison and probation serviceslectronic Signature(s) Signed: 01/23/2020 5:52:05 PM By: [name], Sherrina Zaugg MD Entered By: [name], August Gosser on 01/23/2020 16:40:04 -------------------------------------------------------------------------------- Physical Exam Details Patient Name: Date of Service: [name], [name] 01/23/2020 2:45 PM Medical Record Number: [id number] Patient Account Number: [id number] Date of Birth/Sex: Treating RN: 12-Apr-1951 (68 y.o. Male) Yevonne PaxEpps, Carrie Primary Care Provider: [name], [name] Other Clinician: Referring  Provider: Treating Provider/Extender: Lacretia Leighobson, Metta Koranda Russo, [name] in Treatment: 0 Constitutional Sitting or standing Blood Pressure is within target range for patient.. Pulse regular and within target range for patient.Marland Kitchen. Respirations regular, non-labored and within target range.. Temperature is normal and within the target range for the patient.Marland Kitchen. Appears in no distress.  Respiratory work of breathing is normal. Cardiovascular Popliteal pulses palpable on the right. Both the dorsalis pedis and posterior tibial pulses are palpable. Psychiatric appears at normal baseline. Notes Wound exam; small wound on the tip of his right first toe. Debrided with a #3 curette of surrounding eschar and callus and some debris on the wound surface. There is some bleeding. This cleans up quite nicely. No evidence of surrounding infection. The wound depth is currently superficial Electronic Signature(s) Signed: 01/23/2020 5:52:05 PM By: Baltazar Najjar MD Entered By: Baltazar Najjar on 01/23/2020 16:41:15 -------------------------------------------------------------------------------- Physician Orders Details Patient Name: Date of Service: [name] 01/23/2020 2:45 PM Medical Record Number: [id number] Patient Account Number: [id number] Date of Birth/Sex: Treating RN: 05-12-1951 (68 y.o. Male) Yevonne Pax Primary Care Provider: [name] Other Clinician: Referring Provider: Treating Provider/Extender: Lacretia Leigh in Treatment: 0 Verbal / Phone Orders: No Diagnosis Coding Follow-up Appointments Return Appointment in 1 week. Dressing Change Frequency Wound #1 Right T Great oe Change dressing every day. Wound Cleansing Wound #1 Right T Great oe Clean wound with Normal Saline. Primary Wound Dressing Wound #1 Right T Great oe Hydrofera Blue Secondary Dressing Wound #1 Right T Great oe Kerlix/Rolled Gauze Dry Gauze Electronic Signature(s) Signed: 01/23/2020  5:52:05 PM By: Baltazar Najjar MD Signed: 01/24/2020 4:36:43 PM By: Yevonne Pax RN Entered By: Yevonne Pax on 01/23/2020 16:08:36 -------------------------------------------------------------------------------- Problem List Details Patient Name: Date of Service: [name] 01/23/2020 2:45 PM Medical Record Number: [id number] Patient Account Number: [id number] Date of Birth/Sex: Treating RN: [birth year] (68 y.o. Male) Yevonne Pax Primary Care Provider: [name] Other Clinician: Referring Provider: Treating Provider/Extender: Rennie Natter, Vivianne Spence in Treatment: 0 Active Problems ICD-10 Encounter Code Description Active Date MDM Diagnosis E10.621 Type 1 diabetes mellitus with foot ulcer 01/23/2020 No Yes E10.51 Type 1 diabetes mellitus with diabetic peripheral angiopathy without gangrene 01/23/2020 No Yes E10.42 Type 1 diabetes mellitus with diabetic polyneuropathy 01/23/2020 No Yes L97.512 Non-pressure chronic ulcer of other part of right foot with fat layer exposed 01/23/2020 No Yes Inactive Problems Resolved Problems Electronic Signature(s) Signed: 01/23/2020 5:52:05 PM By: Baltazar Najjar MD Entered By: Baltazar Najjar on 01/23/2020 16:17:01 -------------------------------------------------------------------------------- Progress Note Details Patient Name: Date of Service: [name] 01/23/2020 2:45 PM Medical Record Number: [id number] Patient Account Number: [id number] Date of Birth/Sex: Treating RN: Feb 14, 1951 (68 y.o. 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 He is a type I diabetic on an insulin pump diagnosed at age 44. He also has known peripheral neuropathy. He has been  dealing with a wound on his right first toe since at least February by review of epic. He was seen by Dr. Lajoyce Corners. Diagnosed with an ischemic wound sent to see Dr. Myra Gianotti him who noted a noncompressible ABI 1.67 and a TBI of 0.25 monophasic waveforms. On 11/15/2019 he underwent a right femoral to below-knee popliteal bypass with an ipsilateral saphenous vein. He also underwent extensive endarterectomy I believe that the external iliac. Postoperative ABI was 1.16 he has been using Neosporin on this and gradually making progress according to his companion. He is offloading this with a hole in the inserts in his work shoes. Per his companion who does the dressings the wound has been getting smaller Past medical history; type 1  diabetes with PAD and PN, history of a left BKA in 2017, coronary artery disease, hypercholesterolemia, hypertension he is a smoker Patient History Information obtained from Patient. Allergies No Known Allergies Family History No family history of Cancer, Diabetes, Heart Disease, Hereditary Spherocytosis, Hypertension, Kidney Disease, Lung Disease, Seizures, Stroke, Thyroid Problems, Tuberculosis. Social History Current every day smoker - 1 ppd, Marital Status - Single, Alcohol Use - Rarely, Drug Use - No History, Caffeine Use - Daily - coffee. Medical History Eyes Patient has history of Cataracts - bil removed Denies history of Glaucoma, Optic Neuritis Cardiovascular Patient has history of Coronary Artery Disease, Peripheral Arterial Disease Endocrine Patient has history of Type I Diabetes Denies history of Type II Diabetes Genitourinary Denies history of End Stage Renal Disease Integumentary (Skin) Denies history of History of Burn Musculoskeletal Patient has history of Osteomyelitis Neurologic Patient has history of Neuropathy Psychiatric Denies history of Anorexia/bulimia, Confinement Anxiety Patient is treated with Insulin. Blood sugar is  tested. Hospitalization/Surgery History - fem-pop bypass graft left. - fem-pop bypass graft right. - multiple aortagrams. - left BKA. - left stump revision. Medical A Surgical History Notes nd Eyes diabetic retinopathy Cardiovascular hyperlipidemia Review of Systems (ROS) Constitutional Symptoms (General Health) Denies complaints or symptoms of Fatigue, Fever, Chills, Marked Weight Change. Eyes Complains or has symptoms of Glasses / Contacts - reading. Ear/Nose/Mouth/Throat Denies complaints or symptoms of Chronic sinus problems or rhinitis. Respiratory Denies complaints or symptoms of Chronic or frequent coughs, Shortness of Breath. Gastrointestinal Denies complaints or symptoms of Frequent diarrhea, Nausea, Vomiting. Genitourinary Denies complaints or symptoms of Frequent urination. Integumentary (Skin) Complains or has symptoms of Wounds - left great toe. Musculoskeletal Denies complaints or symptoms of Muscle Pain, Muscle Weakness. Neurologic Complains or has symptoms of Numbness/parasthesias. Psychiatric Denies complaints or symptoms of Claustrophobia, Suicidal. Objective Constitutional Sitting or standing Blood Pressure is within target range for patient.. Pulse regular and within target range for patient.Marland Kitchen Respirations regular, non-labored and within target range.. Temperature is normal and within the target range for the patient.Marland Kitchen Appears in no distress. Vitals Time Taken: 2:45 PM, Height: 71 in, Source: Stated, Weight: 152 lbs, Source: Stated, BMI: 21.2, Temperature: 98.2 F, Pulse: 68 bpm, Respiratory Rate: 18 breaths/min, Blood Pressure: 100/78 mmHg, Capillary Blood Glucose: 149 mg/dl. General Notes: glucose per pt report this am Respiratory work of breathing is normal. Cardiovascular Popliteal pulses palpable on the right. Both the dorsalis pedis and posterior tibial pulses are palpable. Psychiatric appears at normal baseline. General Notes: Wound exam; small  wound on the tip of his right first toe. Debrided with a #3 curette of surrounding eschar and callus and some debris on the wound surface. There is some bleeding. This cleans up quite nicely. No evidence of surrounding infection. The wound depth is currently superficial Integumentary (Hair, Skin) Wound #1 status is Open. Original cause of wound was Gradually Appeared. The wound is located on the Right T Great. The wound measures 0.5cm length x oe 0.3cm width x 0.1cm depth; 0.118cm^2 area and 0.012cm^3 volume. There is Fat Layer (Subcutaneous Tissue) Exposed exposed. There is no tunneling or undermining noted. There is a small amount of serosanguineous drainage noted. The wound margin is distinct with the outline attached to the wound base. There is large (67-100%) red granulation within the wound bed. There is no necrotic tissue within the wound bed. Assessment Active Problems ICD-10 Type 1 diabetes mellitus with foot ulcer Type 1 diabetes mellitus with diabetic peripheral angiopathy without gangrene Type 1 diabetes mellitus with  diabetic polyneuropathy Non-pressure chronic ulcer of other part of right foot with fat layer exposed Procedures Wound #1 Pre-procedure diagnosis of Wound #1 is a Diabetic Wound/Ulcer of the Lower Extremity located on the Right T Great .Severity of Tissue Pre Debridement is: oe Fat layer exposed. There was a Excisional Skin/Subcutaneous Tissue Debridement with a total area of 0.15 sq cm performed by Ricard Dillon., MD. With the following instrument(s): Curette to remove Viable and Non-Viable tissue/material. Material removed includes Subcutaneous Tissue, Slough, Skin: Dermis, and Skin: Epidermis after achieving pain control using Lidocaine 5% topical ointment. No specimens were taken. A time out was conducted at 16:01, prior to the start of the procedure. A Minimum amount of bleeding was controlled with Pressure. The procedure was tolerated well with a pain level  of 0 throughout and a pain level of 0 following the procedure. Post Debridement Measurements: 0.5cm length x 0.3cm width x 0.1cm depth; 0.012cm^3 volume. Character of Wound/Ulcer Post Debridement is improved. Severity of Tissue Post Debridement is: Fat layer exposed. Post procedure Diagnosis Wound #1: Same as Pre-Procedure Plan Follow-up Appointments: Return Appointment in 1 week. Dressing Change Frequency: Wound #1 Right T Great: oe Change dressing every day. Wound Cleansing: Wound #1 Right T Great: oe Clean wound with Normal Saline. Primary Wound Dressing: Wound #1 Right T Great: oe Hydrofera Blue Secondary Dressing: Wound #1 Right T Great: oe Kerlix/Rolled Gauze Dry Gauze 1. We are going to use Hydrofera Blue on this change daily. 2. He has a hammer toe on the right first toe at the interphalangeal joint we showed him how to straighten this to keep the pressure off the bottom of his shoe. Also advised him to wear something with some forefoot room to avoid hitting the tip of the shoe as well. 3. The patient has had successful revascularization by Dr. Trula Slade. Unfortunately he is still a smoker. I did not talk to him about this. 4. Provided he can keep the pressure off the tip of this toe I think this is going to heal. He is very active continues to work his farm. He is not going to offload this aggressively least not by my one conversation with him today. 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 Male) Baruch Gouty Primary Care Provider: [name] Other  Clinician: Referring Provider: Treating Provider/Extender: [name] in Treatment: 0 Information Obtained From Patient Constitutional Symptoms (General Health) Complaints and Symptoms: Negative for: Fatigue; Fever; Chills; Marked Weight Change Eyes Complaints and Symptoms: Positive for: Glasses / Contacts - reading Medical History: Positive for: Cataracts - bil removed Negative for: Glaucoma; Optic Neuritis Past Medical History Notes: diabetic retinopathy Ear/Nose/Mouth/Throat Complaints and Symptoms: Negative for: Chronic sinus problems or rhinitis Respiratory Complaints and Symptoms: Negative for: Chronic or frequent coughs; Shortness of Breath Gastrointestinal Complaints and Symptoms: Negative for: Frequent diarrhea; Nausea; Vomiting Genitourinary Complaints and Symptoms: Negative for: Frequent urination Medical History: Negative for: End Stage Renal Disease Integumentary (Skin) Complaints and Symptoms: Positive for: Wounds - left great toe Medical History: Negative for: History of Burn Musculoskeletal Complaints and Symptoms: Negative for: Muscle Pain; Muscle Weakness Medical History: Positive for: Osteomyelitis Neurologic Complaints and Symptoms: Positive for: Numbness/parasthesias Medical History: Positive for:  Neuropathy Psychiatric Complaints and Symptoms: Negative for: Claustrophobia; Suicidal Medical History: Negative for: Anorexia/bulimia; Confinement Anxiety Hematologic/Lymphatic Cardiovascular Medical History: Positive for: Coronary Artery Disease; Peripheral Arterial Disease Past Medical History Notes: hyperlipidemia Endocrine Medical History: Positive for: Type I Diabetes Negative for: Type II Diabetes Time with diabetes: since 7th grade Treated with: Insulin Blood sugar tested every day: Yes Tested : Immunological Oncologic HBO Extended History Items Eyes: Cataracts Immunizations Pneumococcal  Vaccine: Received Pneumococcal Vaccination: Yes Implantable Devices None Hospitalization / Surgery History Type of Hospitalization/Surgery fem-pop bypass graft left fem-pop bypass graft right multiple aortagrams left BKA left stump revision Family and Social History Cancer: No; Diabetes: No; Heart Disease: No; Hereditary Spherocytosis: No; Hypertension: No; Kidney Disease: No; Lung Disease: No; Seizures: No; Stroke: No; Thyroid Problems: No; Tuberculosis: No; Current every day smoker - 1 ppd; Marital Status - Single; Alcohol Use: Rarely; Drug Use: No History; Caffeine Use: Daily - coffee; Financial Concerns: No; Food, Clothing or Shelter Needs: No; Support System Lacking: No; Transportation Concerns: No Electronic Signature(s) Signed: 01/23/2020 5:52:05 PM By: Baltazar Najjar MD Signed: 01/23/2020 5:54:40 PM By: Zenaida Deed RN, BSN Entered By: Zenaida Deed on 01/23/2020 15:03:55 -------------------------------------------------------------------------------- SuperBill Details Patient Name: Date of Service: [name] 01/23/2020 Medical Record Number: [id number] Patient Account Number: [id number] Date of Birth/Sex: Treating RN: [birth year] (68 y.o. Male) Yevonne Pax Primary Care Provider: [name] Other Clinician: Referring Provider: Treating Provider/Extender: Lacretia Leigh in Treatment: 0 Diagnosis Coding ICD-10 Codes Code Description [phone] Type 1 diabetes mellitus with foot ulcer E10.51 Type 1 diabetes mellitus with diabetic peripheral angiopathy without gangrene E10.42 Type 1 diabetes mellitus with diabetic polyneuropathy L97.512 Non-pressure chronic ulcer of other part of right foot with fat layer exposed Facility Procedures CPT4 Code: [id number] Description: 99213 - WOUND CARE VISIT-LEV 3 EST PT Modifier: 25 Quantity: 1 CPT4 Code: [id number] Description: 11042 - DEB SUBQ TISSUE 20 SQ CM/< ICD-10 Diagnosis Description L97.512 Non-pressure  chronic ulcer of other part of right foot with fat layer exposed E10.621 Type 1 diabetes mellitus with foot ulcer Modifier: Quantity: 1 Physician Procedures : CPT4 Code Description Modifier [id number] WC PHYS LEVEL 3 NEW PT 25 ICD-10 Diagnosis Description E10.621 Type 1 diabetes mellitus with foot ulcer E10.51 Type 1 diabetes mellitus with diabetic peripheral angiopathy without gangrene L97.512 Non-pressure  chronic ulcer of other part of right foot with fat layer exposed E10.42 Type 1 diabetes mellitus with diabetic polyneuropathy Quantity: 1 : [id number] 11042 - WC PHYS SUBQ TISS 20 SQ CM ICD-10 Diagnosis Description L97.512 Non-pressure chronic ulcer of other part of right foot with fat layer exposed E10.621 Type 1 diabetes mellitus with foot ulcer Quantity: 1 Electronic Signature(s) Signed: 01/23/2020 5:52:05 PM By: Baltazar Najjar MD Signed: 01/24/2020 4:36:43 PM By: Yevonne Pax RN Signed: 01/24/2020 4:36:43 PM By: Yevonne Pax RN Entered By: Yevonne Pax on 01/23/2020 16:54:37

## 2020-01-24 NOTE — Progress Notes (Signed)
LYDON, VANSICKLE ([id number]) Visit Report for 01/23/2020 Allergy List Details Patient Name: Date of Service: [name], [name] 01/23/2020 2:45 PM Medical Record Number: [id number] Patient Account Number: [id number] Date of Birth/Sex: Treating RN: 09-Jun-1951 (69 y.o. Male) Zenaida Deed Primary Care Stephens Shreve: [name] Other Clinician: Referring Chrystopher Stangl: Treating Isabel Freese/Extender: Rennie Natter, Vivianne Spence in Treatment: 0 Allergies Active Allergies No Known Allergies Allergy Notes Electronic Signature(s) Signed: 01/23/2020 5:54:40 PM By: Zenaida Deed RN, BSN Entered By: Zenaida Deed on 01/23/2020 14:51:28 -------------------------------------------------------------------------------- Arrival Information Details Patient Name: Date of Service: [name] 01/23/2020 2:45 PM Medical Record Number: [id number] Patient Account Number: [id number] Date of Birth/Sex: Treating RN: [birth year] (69 y.o. Male) Zenaida Deed Primary Care Emmali Karow: [name] Other Clinician: Referring Braeden Kennan: Treating Katheleen Stella/Extender: Lacretia Leigh in Treatment: 0 Visit Information Patient Arrived: Ambulatory Arrival Time: 14:41 Accompanied By: roommate Transfer Assistance: None Patient Identification Verified: Yes Secondary Verification Process Completed: Yes Patient Requires Transmission-Based Precautions: No Patient Has Alerts: No Electronic Signature(s) Signed: 01/23/2020 5:54:40 PM By: Zenaida Deed RN, BSN Entered By: Zenaida Deed on 01/23/2020 14:46:46 -------------------------------------------------------------------------------- Clinic Level of Care Assessment Details Patient Name: Date of Service: [name], [name] 01/23/2020 2:45 PM Medical Record Number: [id number] Patient Account Number: [id number] Date of Birth/Sex: Treating RN: [birth year] (69 y.o. Male) Yevonne Pax Primary Care Valborg Friar: [name] Other Clinician: Referring  Malikah Principato: Treating Mitchell Iwanicki/Extender: Lacretia Leigh in Treatment: 0 Clinic Level of Care Assessment Items TOOL 1 Quantity Score X- 1 0 Use when EandM and Procedure is performed on INITIAL visit ASSESSMENTS - Nursing Assessment / Reassessment X- 1 20 General Physical Exam (combine w/ comprehensive assessment (listed just below) when performed on new pt. evals) X- 1 25 Comprehensive Assessment (HX, ROS, Risk Assessments, Wounds Hx, etc.) ASSESSMENTS - Wound and Skin Assessment / Reassessment []  - 0 Dermatologic / Skin Assessment (not related to wound area) ASSESSMENTS - Ostomy and/or Continence Assessment and Care []  - 0 Incontinence Assessment and Management []  - 0 Ostomy Care Assessment and Management (repouching, etc.) PROCESS - Coordination of Care X - Simple Patient / Family Education for ongoing care 1 15 []  - 0 Complex (extensive) Patient / Family Education for ongoing care X- 1 10 Staff obtains , Records, T Results / Process Orders est []  - 0 Staff telephones HHA, Nursing Homes / Clarify orders / etc []  - 0 Routine Transfer to another Facility (non-emergent condition) []  - 0 Routine Hospital Admission (non-emergent condition) X- 1 15 New Admissions / / Ordering NPWT Apligraf, etc. , []  - 0 Emergency Hospital Admission (emergent condition) PROCESS - Special Needs []  - 0 Pediatric / Minor Patient Management []  - 0 Isolation Patient Management []  - 0 Hearing / Language / Visual special needs []  - 0 Assessment of Community assistance (transportation, D/C planning, etc.) []  - 0 Additional assistance / Altered mentation []  - 0 Support Surface(s) Assessment (bed, cushion, seat, etc.) INTERVENTIONS - Miscellaneous []  - 0 External ear exam []  - 0 Patient Transfer (multiple staff / / Similar devices) []  - 0 Simple Staple / Suture removal (25 or less) []  - 0 Complex Staple / Suture removal (26 or  more) []  - 0 Hypo/Hyperglycemic Management (do not check if billed separately) X- 1 15 Ankle / Brachial Index (ABI) - do not check if billed separately Has the patient been seen at the hospital within the last three years: Yes Total Score: 100 Level Of Care: New/Established - Level 3 Electronic Signature(s)  Signed: 01/24/2020 4:36:43 PM By: Yevonne Pax RN Entered By: Yevonne Pax on 01/23/2020 16:11:18 -------------------------------------------------------------------------------- Encounter Discharge Information Details Patient Name: Date of Service: [name] 01/23/2020 2:45 PM Medical Record Number: [id number] Patient Account Number: [id number] Date of Birth/Sex: Treating RN: [birth year] (69 y.o. Male) Cherylin Mylar Primary Care Alen Matheson: [name] Other Clinician: Referring Wm Sahagun: Treating Nene Aranas/Extender: Lacretia Leigh in Treatment: 0 Encounter Discharge Information Items Post Procedure Vitals Discharge Condition: Stable Temperature (F): 98.2 Ambulatory Status: Ambulatory Pulse (bpm): 68 Discharge Destination: Home Respiratory Rate (breaths/min): 18 Transportation: Private Auto Blood Pressure (mmHg): 100/78 Accompanied By: wife Schedule Follow-up Appointment: Yes Clinical Summary of Care: Patient Declined Electronic Signature(s) Signed: 01/24/2020 7:13:26 AM By: Cherylin Mylar Entered By: Cherylin Mylar on 01/23/2020 16:24:27 -------------------------------------------------------------------------------- Lower Extremity Assessment Details Patient Name: Date of Service: [name] 01/23/2020 2:45 PM Medical Record Number: [id number] Patient Account Number: [id number] Date of Birth/Sex: Treating RN: 29-Aug-1950 (69 y.o. Male) Zenaida Deed Primary Care Casilda Pickerill: [name] Other Clinician: Referring Roben Schliep: Treating Lataisha Colan/Extender: Lacretia Leigh in Treatment: 0 Edema Assessment Assessed:  Kyra Searles: No] [Right: No] Edema: [Left: Ye] [Right: s] Calf Left: Right: Point of Measurement: cm From Medial Instep cm 35 cm Ankle Left: Right: Point of Measurement: cm From Medial Instep cm 22.3 cm Vascular Assessment Pulses: Dorsalis Pedis Palpable: [Right:No] Posterior Tibial Palpable: [Right:No] Notes right ABI 1.16 per vascular Electronic Signature(s) Signed: 01/23/2020 5:54:40 PM By: Zenaida Deed RN, BSN Entered By: Zenaida Deed on 01/23/2020 15:13:37 -------------------------------------------------------------------------------- Multi Wound Chart Details Patient Name: Date of Service: [name] 01/23/2020 2:45 PM Medical Record Number: [id number] Patient Account Number: [id number] Date of Birth/Sex: Treating RN: [birth year] (68 y.o. Male) Yevonne Pax Primary Care Evian Salguero: [name] Other Clinician: Referring Matison Nuccio: Treating Maui Britten/Extender: Lacretia Leigh in Treatment: 0 Vital Signs Height(in): 71 Capillary Blood Glucose(mg/dl): 458 Weight(lbs): 592 Pulse(bpm): 68 Body Mass Index(BMI): 21 Blood Pressure(mmHg): 100/78 Temperature(F): 98.2 Respiratory Rate(breaths/min): 18 Photos: [1:No Photos Right T Great oe] [N/A:N/A N/A] Wound Location: [1:Gradually Appeared] [N/A:N/A] Wounding Event: [1:Diabetic Wound/Ulcer of the Lower] [N/A:N/A] Primary Etiology: [1:Extremity Cataracts, Coronary Artery Disease,] [N/A:N/A] Comorbid History: [1:Peripheral Arterial Disease, Type I Diabetes, Osteomyelitis, Neuropathy 11/23/2019] [N/A:N/A] Date Acquired: [1:0] [N/A:N/A] Weeks of Treatment: [1:Open] [N/A:N/A] Wound Status: [1:0.5x0.3x0.1] [N/A:N/A] Measurements L x W x D (cm) [1:0.118] [N/A:N/A] A (cm) : rea [1:0.012] [N/A:N/A] Volume (cm) : [1:Grade 2] [N/A:N/A] Classification: [1:Small] [N/A:N/A] Exudate A mount: [1:Serosanguineous] [N/A:N/A] Exudate Type: [1:red, brown] [N/A:N/A] Exudate Color: [1:Distinct, outline attached]  [N/A:N/A] Wound Margin: [1:Large (67-100%)] [N/A:N/A] Granulation A mount: [1:Red] [N/A:N/A] Granulation Quality: [1:None Present (0%)] [N/A:N/A] Necrotic A mount: [1:Fat Layer (Subcutaneous Tissue)] [N/A:N/A] Exposed Structures: [1:Exposed: Yes Fascia: No Tendon: No Muscle: No Joint: No Bone: No None] [N/A:N/A] Epithelialization: [1:Debridement - Excisional] [N/A:N/A] Debridement: Pre-procedure Verification/Time Out 16:01 [N/A:N/A] Taken: [1:Lidocaine 5% topical ointment] [N/A:N/A] Pain Control: [1:Subcutaneous, Slough] [N/A:N/A] Tissue Debrided: [1:Skin/Subcutaneous Tissue] [N/A:N/A] Level: [1:0.15] [N/A:N/A] Debridement A (sq cm): [1:rea Curette] [N/A:N/A] Instrument: [1:Minimum] [N/A:N/A] Bleeding: [1:Pressure] [N/A:N/A] Hemostasis A chieved: [1:0] [N/A:N/A] Procedural Pain: [1:0] [N/A:N/A] Post Procedural Pain: [1:Procedure was tolerated well] [N/A:N/A] Debridement Treatment Response: [1:0.5x0.3x0.1] [N/A:N/A] Post Debridement Measurements L x W x D (cm) [1:0.012] [N/A:N/A] Post Debridement Volume: (cm) [1:Debridement] [N/A:N/A] Treatment Notes Wound #1 (Right Toe Great) 1. Cleanse With Wound Cleanser 2. Periwound Care Skin Prep 3. Primary Dressing Applied Hydrofera Blue 4. Secondary Dressing Dry Gauze Roll Gauze 5. Secured With Secretary/administrator) Signed: 01/23/2020 5:52:05 PM By: Baltazar Najjar MD Signed:  01/24/2020 4:36:43 PM By: Yevonne Pax RN Entered By: Baltazar Najjar on 01/23/2020 16:36:35 -------------------------------------------------------------------------------- Multi-Disciplinary Care Plan Details Patient Name: Date of Service: [name] 01/23/2020 2:45 PM Medical Record Number: [id number] Patient Account Number: [id number] Date of Birth/Sex: Treating RN: Apr 03, 1951 (68 y.o. Male) Yevonne Pax Primary Care Artrell Lawless: [name] Other Clinician: Referring Masami Plata: Treating Berman Grainger/Extender: Lacretia Leigh in  Treatment: 0 Active Inactive Wound/Skin Impairment Nursing Diagnoses: Knowledge deficit related to ulceration/compromised skin integrity Goals: Patient/caregiver will verbalize understanding of skin care regimen Date Initiated: 01/23/2020 Target Resolution Date: 02/22/2020 Goal Status: Active Ulcer/skin breakdown will have a volume reduction of 30% by week 4 Date Initiated: 01/23/2020 Target Resolution Date: 02/22/2020 Goal Status: Active Interventions: Assess patient/caregiver ability to obtain necessary supplies Assess patient/caregiver ability to perform ulcer/skin care regimen upon admission and as needed Assess ulceration(s) every visit Notes: Electronic Signature(s) Signed: 01/24/2020 4:36:43 PM By: Yevonne Pax RN Entered By: Yevonne Pax on 01/23/2020 16:00:22 -------------------------------------------------------------------------------- Pain Assessment Details Patient Name: Date of Service: [name], [name] 01/23/2020 2:45 PM Medical Record Number: [id number] Patient Account Number: [id number] Date of Birth/Sex: Treating RN: [birth year] (68 y.o. Male) Zenaida Deed Primary Care Sylvi Rybolt: Other Clinician: [name] Referring Ilyana Manuele: Treating Makenli Derstine/Extender: Lacretia Leigh in Treatment: 0 Active Problems Location of Pain Severity and Description of Pain Patient Has Paino No Site Locations Rate the pain. Current Pain Level: 0 Pain Management and Medication Current Pain Management: Electronic Signature(s) Signed: 01/23/2020 5:54:40 PM By: Zenaida Deed RN, BSN Entered By: Zenaida Deed on 01/23/2020 15:15:20 -------------------------------------------------------------------------------- Patient/Caregiver Education Details Patient Name: Date of Service: [name] 6/1/2021andnbsp2:45 PM Medical Record Number: [id number] Patient Account Number: [id number] Date of Birth/Gender: Treating RN: Jul 29, 1951 (68 y.o. Male) Yevonne Pax Primary Care Physician: [name] Other Clinician: Referring Physician: Treating Physician/Extender: Lacretia Leigh in Treatment: 0 Education Assessment Education Provided To: Patient Education Topics Provided Wound/Skin Impairment: Methods: Explain/Verbal Responses: State content correctly Electronic Signature(s) Signed: 01/24/2020 4:36:43 PM By: Yevonne Pax RN Entered By: Yevonne Pax on 01/23/2020 16:00:32 -------------------------------------------------------------------------------- Wound Assessment Details Patient Name: Date of Service: [name], [name] 01/23/2020 2:45 PM Medical Record Number: [id number] Patient Account Number: [id number] Date of Birth/Sex: Treating RN: 21-Sep-1950 (68 y.o. Male) Zenaida Deed Primary Care Ameenah Prosser: [name] Other Clinician: Referring Chad Tiznado: Treating Polette Nofsinger/Extender: Lacretia Leigh in Treatment: 0 Wound Status Wound Number: 1 Primary Diabetic Wound/Ulcer of the Lower Extremity Etiology: Wound Location: Right T Great oe Wound Open Wounding Event: Gradually Appeared Status: Date Acquired: 11/23/2019 Comorbid Cataracts, Coronary Artery Disease, Peripheral Arterial Disease, Weeks Of Treatment: 0 History: Type I Diabetes, Osteomyelitis, Neuropathy Clustered Wound: No Photos Photo Uploaded By: Benjaman Kindler on 01/24/2020 08:51:33 Wound Measurements Length: (cm) 0.5 % Width: (cm) 0.3 % Depth: (cm) 0.1 Ep Area: (cm) 0.118 T Volume: (cm) 0.012 U Reduction in Area: Reduction in Volume: ithelialization: None unneling: No ndermining: No Wound Description Classification: Grade 2 Fo Wound Margin: Distinct, outline attached Sl Exudate Amount: Small Exudate Type: Serosanguineous Exudate Color: red, brown ul Odor After Cleansing: No ough/Fibrino Yes Wound Bed Granulation Amount: Large (67-100%) Exposed Structure Granulation Quality: Red Fascia Exposed: No Necrotic  Amount: None Present (0%) Fat Layer (Subcutaneous Tissue) Exposed: Yes Tendon Exposed: No Muscle Exposed: No Joint Exposed: No Bone Exposed: No Treatment Notes Wound #1 (Right Toe Great) 1. Cleanse With Wound Cleanser 2. Periwound Care Skin Prep 3. Primary Dressing Applied Hydrofera Blue 4. Secondary Dressing Dry Gauze Roll Gauze 5. Secured With  Tape Electronic Signature(s) Signed: 01/23/2020 5:54:40 PM By: Baruch Gouty RN, BSN Entered By: Baruch Gouty on 01/23/2020 15:15:06 -------------------------------------------------------------------------------- Vitals Details Patient Name: Date of Service: [name] 01/23/2020 2:45 PM Medical Record Number: [id number] Patient Account Number: [id number] Date of Birth/Sex: Treating RN: 02-20-1951 (68 y.o. Male) Baruch Gouty Primary Care Trigger Frasier: [name] Other Clinician: Referring Riddhi Grether: Treating Eilene Voigt/Extender: [name] in Treatment: 0 Vital Signs Time Taken: 14:45 Temperature (F): 98.2 Height (in): 71 Pulse (bpm): 68 Source: Stated Respiratory Rate (breaths/min): 18 Weight (lbs): 152 Blood Pressure (mmHg): 100/78 Source: Stated Capillary Blood Glucose (mg/dl): 149 Body Mass Index (BMI): 21.2 Reference Range: 80 - 120 mg / dl Notes glucose per pt report this am Electronic Signature(s) Signed: 01/23/2020 5:54:40 PM By: Baruch Gouty RN, BSN Entered By: Baruch Gouty on 01/23/2020 14:49:01

## 2020-01-31 ENCOUNTER — Encounter (HOSPITAL_BASED_OUTPATIENT_CLINIC_OR_DEPARTMENT_OTHER): Payer: Medicare Other | Admitting: Physician Assistant

## 2020-02-06 ENCOUNTER — Other Ambulatory Visit: Payer: Self-pay

## 2020-02-06 ENCOUNTER — Encounter (HOSPITAL_BASED_OUTPATIENT_CLINIC_OR_DEPARTMENT_OTHER): Payer: Medicare Other | Admitting: Internal Medicine

## 2020-02-06 DIAGNOSIS — E10621 Type 1 diabetes mellitus with foot ulcer: Secondary | ICD-10-CM | POA: Diagnosis not present

## 2020-02-06 NOTE — Progress Notes (Signed)
WESTON, [name] ([id number]) Visit Report for 02/06/2020 Arrival Information Details Patient Name: Date of Service: [name], [name] 02/06/2020 10:15 A M Medical Record Number: [id number] Patient Account Number: [id number] Date of Birth/Sex: Treating RN: [birth year] (69 y.o. Lorette Ang, Meta.Reding Primary Care Hardie Veltre: [name] Other Clinician: Referring Malayah Demuro: Treating Shourya Macpherson/Extender: [name] in Treatment: 2 Visit Information History Since Last Visit Added or deleted any medications: No Patient Arrived: Ambulatory Any new allergies or adverse reactions: No Arrival Time: 10:22 Had a fall or experienced change in No Accompanied By: wife activities of daily living that may affect Transfer Assistance: None risk of falls: Patient Identification Verified: Yes Signs or symptoms of abuse/neglect since last visito No Secondary Verification Process Completed: Yes Hospitalized since last visit: No Patient Requires Transmission-Based Precautions: No Implantable device outside of the clinic excluding No Patient Has Alerts: No cellular tissue based products placed in the center since last visit: Has Dressing in Place as Prescribed: Yes Pain Present Now: No Electronic Signature(s) Signed: 02/06/2020 5:41:07 PM By: Deon Pilling Entered By: Deon Pilling on 02/06/2020 10:32:01 -------------------------------------------------------------------------------- Clinic Level of Care Assessment Details Patient Name: Date of Service: [name], [name] 02/06/2020 10:15 A M Medical Record Number: [id number] Patient Account Number: [id number] Date of Birth/Sex: Treating RN: August 18, 1951 (69 y.o. [name] Primary Care Ciclaly Mulcahey: [name] Other Clinician: Referring Kayelee Herbig: Treating Laneah Luft/Extender: [name] in Treatment: 2 Clinic Level of Care Assessment Items TOOL 4 Quantity Score X- 1 0 Use when only an EandM is performed on  FOLLOW-UP visit ASSESSMENTS - Nursing Assessment / Reassessment X- 1 10 Reassessment of Co-morbidities (includes updates in patient status) X- 1 5 Reassessment of Adherence to Treatment Plan ASSESSMENTS - Wound and Skin A ssessment / Reassessment X - Simple Wound Assessment / Reassessment - one wound 1 5 []  - 0 Complex Wound Assessment / Reassessment - multiple wounds []  - 0 Dermatologic / Skin Assessment (not related to wound area) ASSESSMENTS - Focused Assessment X- 1 5 Circumferential Edema Measurements - multi extremities []  - 0 Nutritional Assessment / Counseling / Intervention []  - 0 Lower Extremity Assessment (monofilament, tuning fork, pulses) []  - 0 Peripheral Arterial Disease Assessment (using hand held doppler) ASSESSMENTS - Ostomy and/or Continence Assessment and Care []  - 0 Incontinence Assessment and Management []  - 0 Ostomy Care Assessment and Management (repouching, etc.) PROCESS - Coordination of Care X - Simple Patient / Family Education for ongoing care 1 15 []  - 0 Complex (extensive) Patient / Family Education for ongoing care []  - 0 Staff obtains Programmer, systems, Records, T Results / Process Orders est []  - 0 Staff telephones HHA, Nursing Homes / Clarify orders / etc []  - 0 Routine Transfer to another Facility (non-emergent condition) []  - 0 Routine Hospital Admission (non-emergent condition) []  - 0 New Admissions / Biomedical engineer / Ordering NPWT Apligraf, etc. , []  - 0 Emergency Hospital Admission (emergent condition) X- 1 10 Simple Discharge Coordination []  - 0 Complex (extensive) Discharge Coordination PROCESS - Special Needs []  - 0 Pediatric / Minor Patient Management []  - 0 Isolation Patient Management []  - 0 Hearing / Language / Visual special needs []  - 0 Assessment of Community assistance (transportation, D/C planning, etc.) []  - 0 Additional assistance / Altered mentation []  - 0 Support Surface(s) Assessment (bed, cushion,  seat, etc.) INTERVENTIONS - Wound Cleansing / Measurement X - Simple Wound Cleansing - one wound 1 5 []  - 0 Complex Wound Cleansing - multiple wounds X- 1  5 Wound Imaging (photographs - any number of wounds) []  - 0 Wound Tracing (instead of photographs) X- 1 5 Simple Wound Measurement - one wound []  - 0 Complex Wound Measurement - multiple wounds INTERVENTIONS - Wound Dressings X - Small Wound Dressing one or multiple wounds 1 10 []  - 0 Medium Wound Dressing one or multiple wounds []  - 0 Large Wound Dressing one or multiple wounds X- 1 5 Application of Medications - topical []  - 0 Application of Medications - injection INTERVENTIONS - Miscellaneous []  - 0 External ear exam []  - 0 Specimen Collection (cultures, biopsies, blood, body fluids, etc.) []  - 0 Specimen(s) / Culture(s) sent or taken to Lab for analysis []  - 0 Patient Transfer (multiple staff / / Similar devices) []  - 0 Simple Staple / Suture removal (25 or less) []  - 0 Complex Staple / Suture removal (26 or more) []  - 0 Hypo / Hyperglycemic Management (close monitor of Blood Glucose) []  - 0 Ankle / Brachial Index (ABI) - do not check if billed separately X- 1 5 Vital Signs Has the patient been seen at the hospital within the last three years: Yes Total Score: 85 Level Of Care: New/Established - Level 3 Electronic Signature(s) Signed: 02/06/2020 5:39:21 PM By: Entered By: on 02/06/2020 11:42:55 -------------------------------------------------------------------------------- Encounter Discharge Information Details Patient Name: Date of Service: . 02/06/2020 10:15 A M Medical Record Number: Patient Account Number: Date of Birth/Sex: Treating RN: 11/27/1950 (69 y.o. Primary Care [name]: Other Clinician: Referring Kerrilynn Derenzo: Treating Breydon Senters/Extender: in  Treatment: 2 Encounter Discharge Information Items Discharge Condition: Stable Ambulatory Status: Ambulatory Discharge Destination: Home Transportation: Private Auto Accompanied By: wife Schedule Follow-up Appointment: Yes Clinical Summary of Care: Patient Declined Electronic Signature(s) Signed: 02/06/2020 5:39:21 PM By: Cherylin Mylar Entered By: Cherylin Mylar on 02/06/2020 11:42:26 -------------------------------------------------------------------------------- Lower Extremity Assessment Details Patient Name: Date of Service: DUVAL, MACLEOD 02/06/2020 10:15 A M Medical Record Number: [id number] Patient Account Number: [id number] Date of Birth/Sex: Treating RN: 1950-09-13 (69 y.o. [name] Primary Care Aino Heckert: [name] Other Clinician: Referring Johnmark Geiger: Treating Krystalyn Kubota/Extender: Lacretia Leigh in Treatment: 2 Edema Assessment Assessed: 02/08/2020: No] Cherylin Mylar: Yes] Edema: [Left: Ye] [Right: s] Calf Left: Right: Point of Measurement: cm From Medial Instep cm 35.8 cm Ankle Left: Right: Point of Measurement: cm From Medial Instep cm 23 cm Vascular Assessment Pulses: Dorsalis Pedis Palpable: [Right:Yes] Electronic Signature(s) Signed: 02/06/2020 5:41:07 PM By: 02/08/2020 Entered By: [name] on 02/06/2020 10:32:31 -------------------------------------------------------------------------------- Pain Assessment Details Patient Name: Date of Service: GOVANNI, PLEMONS 02/06/2020 10:15 A M Medical Record Number: 07/19/1951 Patient Account Number: [id number] Date of Birth/Sex: Treating RN: Sep 24, 1950 (69 y.o. Lacretia Leigh Primary Care Agnieszka Newhouse: Kyra Searles Other Clinician: Referring Jorian Willhoite: Treating Mazy Culton/Extender: Franne Forts in Treatment: 2 Active Problems Location of Pain Severity and Description of Pain Patient Has Paino No Site Locations Rate the pain. Current Pain Level: 0 Pain  Management and Medication Current Pain Management: Medication: No Cold Application: No Rest: No Massage: No Activity: No T.E.N.S.: No Heat Application: No Leg drop or elevation: No Is the Current Pain Management Adequate: Adequate How does your wound impact your activities of daily livingo Sleep: No Bathing: No Appetite: No Relationship With Others: No Bladder Continence: No Emotions: No Bowel Continence: No Work: No Toileting: No Drive: No Dressing: No Hobbies: No Electronic Signature(s) Signed: 02/06/2020 5:41:07 PM  By: Shawn Stall Entered By: Shawn Stall on 02/06/2020 10:32:22 -------------------------------------------------------------------------------- Patient/Caregiver Education Details Patient Name: Date of Service: [name] 6/15/2021andnbsp10:15 A M Medical Record Number: [id number] Patient Account Number: [id number] Date of Birth/Gender: Treating RN: [birth year] (69 y.o. [name] Primary Care Physician: [name] Other Clinician: Referring Physician: Treating Physician/Extender: Lacretia Leigh in Treatment: 2 Education Assessment Education Provided To: Patient Education Topics Provided Wound/Skin Impairment: Handouts: Caring for Your Ulcer Methods: Explain/Verbal Responses: State content correctly Electronic Signature(s) Signed: 02/06/2020 5:39:21 PM By: Cherylin Mylar Entered By: Cherylin Mylar on 02/06/2020 11:42:14 -------------------------------------------------------------------------------- Wound Assessment Details Patient Name: Date of Service: JANI, PLOEGER 02/06/2020 10:15 A M Medical Record Number: [id number] Patient Account Number: [id number] Date of Birth/Sex: Treating RN: 1950-09-18 (69 y.o. Harlon Flor, Millard.Loa Primary Care Karmen Altamirano: [name] Other Clinician: Referring Shedric Fredericks: Treating Jolayne Branson/Extender: Lacretia Leigh in Treatment: 2 Wound Status Wound  Number: 1 Primary Diabetic Wound/Ulcer of the Lower Extremity Etiology: Wound Location: Right T Great oe Wound Open Wounding Event: Gradually Appeared Status: Date Acquired: 11/23/2019 Comorbid Cataracts, Coronary Artery Disease, Peripheral Arterial Disease, Weeks Of Treatment: 2 History: Type I Diabetes, Osteomyelitis, Neuropathy Clustered Wound: No Wound Measurements Length: (cm) 0.5 Width: (cm) 0.4 Depth: (cm) 0.1 Area: (cm) 0.157 Volume: (cm) 0.016 % Reduction in Area: -33.1% % Reduction in Volume: -33.3% Epithelialization: None Tunneling: No Undermining: No Wound Description Classification: Grade 2 Wound Margin: Distinct, outline attached Exudate Amount: Small Exudate Type: Serosanguineous Exudate Color: red, brown Foul Odor After Cleansing: No Slough/Fibrino Yes Wound Bed Granulation Amount: Large (67-100%) Exposed Structure Granulation Quality: Red Fascia Exposed: No Necrotic Amount: None Present (0%) Fat Layer (Subcutaneous Tissue) Exposed: Yes Tendon Exposed: No Muscle Exposed: No Joint Exposed: No Bone Exposed: No Treatment Notes Wound #1 (Right Toe Great) 1. Cleanse With Wound Cleanser 2. Periwound Care Skin Prep 3. Primary Dressing Applied Hydrofera Blue 4. Secondary Dressing Dry Gauze Roll Gauze 5. Secured With Tape Notes Government social research officer) Signed: 02/06/2020 5:41:07 PM By: Shawn Stall Entered By: Shawn Stall on 02/06/2020 10:33:40 -------------------------------------------------------------------------------- Vitals Details Patient Name: Date of Service: [name]. 02/06/2020 10:15 A M Medical Record Number: [id number] Patient Account Number: [id number] Date of Birth/Sex: Treating RN: [birth year] (69 y.o. Tammy Sours Primary Care Samaria Anes: [name] Other Clinician: Referring Kamorah Nevils: Treating Dreshaun Stene/Extender: Lacretia Leigh in Treatment: 2 Vital Signs Time Taken: 10:25 Temperature  (F): 98.5 Height (in): 71 Pulse (bpm): 60 Weight (lbs): 152 Respiratory Rate (breaths/min): 18 Body Mass Index (BMI): 21.2 Blood Pressure (mmHg): 126/80 Reference Range: 80 - 120 mg / dl Electronic Signature(s) Signed: 02/06/2020 5:41:07 PM By: Shawn Stall Entered By: Shawn Stall on 02/06/2020 10:32:14

## 2020-02-06 NOTE — Progress Notes (Signed)
[name], [name] ([id number]) Visit Report for 02/06/2020 SuperBill Details Patient Name: Date of Service: [name], [name] 02/06/2020 Medical Record Number: [id number] Patient Account Number: [id number] Date of Birth/Sex: Treating RN: September 29, 1950 (69 y.o. 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

## 2020-02-20 ENCOUNTER — Encounter (HOSPITAL_BASED_OUTPATIENT_CLINIC_OR_DEPARTMENT_OTHER): Payer: Medicare Other | Admitting: Internal Medicine

## 2020-02-20 DIAGNOSIS — E10621 Type 1 diabetes mellitus with foot ulcer: Secondary | ICD-10-CM | POA: Diagnosis not present

## 2020-02-21 NOTE — Progress Notes (Signed)
[name], [name] ([id number]) Visit Report for 02/20/2020 Debridement Details Patient Name: Date of Service: [name], [name] 02/20/2020 8:00 A M Medical Record Number: [id number] Patient Account Number: [id number] Date of Birth/Sex: Treating [name]: 1951/07/15 (69 y.o. [name] PetitM) [name] PaxEpps, [name] Primary Care Provider: [name], [name] Other Clinician: Referring Provider: Treating Provider/Extender: [name], [name] [name], [name] [name] in Treatment: 4 Debridement Performed for Assessment: Wound #1 Right T Great oe Performed By: Physician [name], [name] Zuluaga G., [name] Debridement Type: Debridement Severity of Tissue Pre Debridement: Fat layer exposed Level of Consciousness (Pre-procedure): Awake and Alert Pre-procedure Verification/Time Out Yes - 08:34 Taken: Start Time: 08:34 Pain Control: Lidocaine 5% topical ointment T Area Debrided (L x W): otal 0.4 (cm) x 0.4 (cm) = 0.16 (cm) Tissue and other material debrided: Viable, Non-Viable, Callus, Skin: Dermis , Skin: Epidermis Level: Skin/Epidermis Debridement Description: Selective/Open Wound Instrument: Curette Bleeding: Moderate Hemostasis Achieved: Pressure End Time: 08:36 Procedural Pain: 0 Post Procedural Pain: 0 Response to Treatment: Procedure was tolerated well Level of Consciousness (Post- Awake and Alert procedure): Post Debridement Measurements of Total Wound Length: (cm) 0.4 Width: (cm) 0.4 Depth: (cm) 0.4 Volume: (cm) 0.05 Character of Wound/Ulcer Post Debridement: Improved Severity of Tissue Post Debridement: Fat layer exposed Post Procedure Diagnosis Same as Pre-procedure Electronic Signature(s) Signed: 02/20/2020 5:22:48 PM By: [name] PaxEpps, Carrie [name] Signed: 02/21/2020 3:41:00 PM By: [name], [name] Urick [name] Entered By: [name], [name] on 02/20/2020 08:40:52 -------------------------------------------------------------------------------- HPI Details Patient Name: Date of Service: [name], [name] 02/20/2020 8:00 A M Medical Record Number:  [id number] Patient Account Number: [id number] Date of Birth/Sex: Treating [name]: 1951/07/15 (69 y.o. 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 He is a type I diabetic on an insulin pump diagnosed at age 89. He also has known peripheral neuropathy. He has been dealing with a wound on his right first toe since at least February by review of epic. He was seen by Dr. Lajoyce Cornersuda. Diagnosed with an ischemic wound sent to see Dr. Myra GianottiBrabham him who noted a noncompressible ABI 1.67 and a TBI of 0.25 monophasic waveforms. On 11/15/2019 he underwent a right femoral to below-knee popliteal bypass with an ipsilateral saphenous vein. He also underwent extensive endarterectomy I believe that the external iliac. Postoperative ABI was 1.16 he has been using Neosporin on this and gradually making progress according to his companion. He is offloading this with a hole in the inserts in his work shoes. Per his companion who does the dressings the wound has been getting smaller Past medical history; type 1 diabetes with PAD and PN, history of a left BKA in 2017, coronary artery disease, hypercholesterolemia, hypertension he is a smoker 6/29; this is a patient I have not seen in 4 [name]. He is a type I diabetic on an insulin pump with a prior left BKA. He was revascularized before he came to our clinic with a right femoral to below-knee popliteal bypass with a saphenous vein. He also had an extensive endarterectomy. We used Hydrofera Blue to this wound. 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 Wears work boots. Electronic Signature(s) Signed: 02/21/2020 3:41:00 PM By: [name], [name] Entered By: [name], [name] on 02/20/2020  08:41:52 -------------------------------------------------------------------------------- Physical Exam Details Patient Name: Date of Service: [name], [name] 02/20/2020 8:00 A M Medical Record Number: [id number] Patient  Account Number: [id number] Date of Birth/Sex: Treating [name]: 08/24/1951 (69 y.o. [name]) [name] Primary Care Provider: Creola [name] Other Clinician: Referring Provider: Treating Provider/Extender: [name] Leigh in Treatment: 4 Constitutional Sitting or standing Blood Pressure is within target range for patient.. Pulse regular and within target range for patient.Marland Kitchen Respirations regular, non-labored and within target range.. Temperature is normal and within the target range for the patient.Marland Kitchen Appears in no distress. Cardiovascular Pedal pulses are easily palpable at the posterior tibial and dorsalis pedis. Notes Wound exam; Small wound on the tip of the right first toe. There is callus and thick skin around this which I gently removed with a #3 curette to try and give a smooth wound edge. The wound itself looks healthy. No debridement here no undermining and no probable depth to deep tissue or bone Electronic Signature(s) Signed: 02/21/2020 3:41:00 PM By: [name] Entered By: [name] on 02/20/2020 08:43:29 -------------------------------------------------------------------------------- Physician Orders Details Patient Name: Date of Service: [name] [place]. 02/20/2020 8:00 A M Medical Record Number: [id number] Patient Account Number: [id number] Date of Birth/Sex: Treating [name]: September 05, 1950 (69 y.o. [name]) [name] Primary Care Provider: Creola [name] Other Clinician: Referring Provider: Treating Provider/Extender: [name] Leigh in Treatment: 4 Verbal / Phone Orders: No Diagnosis Coding ICD-10 Coding Code Description E10.621 Type 1 diabetes mellitus with foot ulcer E10.51 Type 1 diabetes mellitus with diabetic  peripheral angiopathy without gangrene E10.42 Type 1 diabetes mellitus with diabetic polyneuropathy L97.512 Non-pressure chronic ulcer of other part of right foot with fat layer exposed Follow-up Appointments Return Appointment in 1 week. Dressing Change Frequency Wound #1 Right T Great oe Change dressing every day. Wound Cleansing Wound #1 Right T Great oe Clean wound with Normal Saline. Primary Wound Dressing Wound #1 Right T Great oe Hydrofera Blue Secondary Dressing Wound #1 Right T Great oe Kerlix/Rolled Gauze Dry Gauze Electronic Signature(s) Signed: 02/20/2020 5:22:48 PM By: [name] Pax [name] Signed: 02/21/2020 3:41:00 PM By: [name] Entered By: [name] on 02/20/2020 08:04:03 -------------------------------------------------------------------------------- Problem List Details Patient Name: Date of Service: [name] Boston. 02/20/2020 8:00 A M Medical Record Number: [id number] Patient Account Number: [id number] Date of Birth/Sex: Treating [name]: Nov 18, 1950 (68 y.o. [name] [place] Primary Care Provider: Creola [name] Other Clinician: Referring Provider: Treating Provider/Extender: Rennie Natter, Vivianne Spence in Treatment: 4 Active Problems ICD-10 Encounter Code Description Active Date MDM Diagnosis E10.621 Type 1 diabetes mellitus with foot ulcer 01/23/2020 No Yes E10.51 Type 1 diabetes mellitus with diabetic peripheral angiopathy without gangrene 01/23/2020 No Yes E10.42 Type 1 diabetes mellitus with diabetic polyneuropathy 01/23/2020 No Yes L97.512 Non-pressure chronic ulcer of other part of right foot with fat layer exposed 01/23/2020 No Yes Inactive Problems Resolved Problems Electronic Signature(s) Signed: 02/21/2020 3:41:00 PM By: [name] Entered By: [name] on 02/20/2020 08:40:34 -------------------------------------------------------------------------------- Progress Note Details Patient Name: Date of Service: [name] [place] 02/20/2020 8:00 A M Medical Record Number: [id number] Patient Account Number: [id number] Date of Birth/Sex: Treating [name]: 10/25/1950 (68 y.o. 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 He is a type I diabetic on an insulin pump diagnosed at age 54. He also has known peripheral neuropathy. He has been dealing with a wound on his right first toe since at  least February by review of epic. He was seen by Dr. Lajoyce Corners. Diagnosed with an ischemic wound sent to see Dr. Myra Gianotti him who noted a noncompressible ABI 1.67 and a TBI of 0.25 monophasic waveforms. On 11/15/2019 he underwent a right femoral to below-knee popliteal bypass with an ipsilateral saphenous vein. He also underwent extensive endarterectomy I believe that the external iliac. Postoperative ABI was 1.16 he has been using Neosporin on this and gradually making progress according to his companion. He is offloading this with a hole in the inserts in his work shoes. Per his companion who does the dressings the wound has been getting smaller Past medical history; type 1 diabetes with PAD and PN, history of a left BKA in 2017, coronary artery disease, hypercholesterolemia, hypertension he is a smoker 6/29; this is a patient I have not seen in 4 [name]. He is a type I diabetic on an insulin pump with a prior left BKA. He was revascularized before he came to our clinic with a right femoral to below-knee popliteal bypass with a saphenous vein. He also had an extensive endarterectomy. We used Hydrofera Blue to this wound. 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 Wears work boots. Objective Constitutional Sitting or standing Blood Pressure is within target range for  patient.. Pulse regular and within target range for patient.Marland Kitchen Respirations regular, non-labored and within target range.. Temperature is normal and within the target range for the patient.Marland Kitchen Appears in no distress. Vitals Time Taken: 8:05 AM, Height: 71 in, Weight: 152 lbs, BMI: 21.2, Temperature: 98.5 F, Pulse: 58 bpm, Respiratory Rate: 19 breaths/min, Blood Pressure: 137/64 mmHg. Cardiovascular Pedal pulses are easily palpable at the posterior tibial and dorsalis pedis. General Notes: Wound exam; ooSmall wound on the tip of the right first toe. There is callus and thick skin around this which I gently removed with a #3 curette to try and give a smooth wound edge. The wound itself looks healthy. No debridement here no undermining and no probable depth to deep tissue or bone Integumentary (Hair, Skin) Wound #1 status is Open. Original cause of wound was Gradually Appeared. The wound is located on the Right T Great. The wound measures 0.4cm length x oe 0.4cm width x 0.4cm depth; 0.126cm^2 area and 0.05cm^3 volume. There is Fat Layer (Subcutaneous Tissue) Exposed exposed. There is no tunneling noted, however, there is undermining starting at 12:00 and ending at 12:00 with a maximum distance of 0.5cm. There is a small amount of serosanguineous drainage noted. The wound margin is well defined and not attached to the wound base. There is large (67-100%) red, pink granulation within the wound bed. There is no necrotic tissue within the wound bed. Assessment Active Problems ICD-10 Type 1 diabetes mellitus with foot ulcer Type 1 diabetes mellitus with diabetic peripheral angiopathy without gangrene Type 1 diabetes mellitus with diabetic polyneuropathy Non-pressure chronic ulcer of other part of right foot with fat layer exposed Procedures Wound #1 Pre-procedure diagnosis of Wound #1 is a Diabetic Wound/Ulcer of the Lower Extremity located on the Right T Great .Severity of Tissue Pre Debridement  is: oe Fat layer exposed. There was a Selective/Open Wound Skin/Epidermis Debridement with a total area of 0.16 sq cm performed by [name] Caul., [name]. With the following instrument(s): Curette to remove Viable and Non-Viable tissue/material. Material removed includes Callus, Skin: Dermis, and Skin: Epidermis after achieving pain control using Lidocaine 5% topical ointment. No specimens were taken. A time out was conducted at 08:34, prior to  the start of the procedure. A Moderate amount of bleeding was controlled with Pressure. The procedure was tolerated well with a pain level of 0 throughout and a pain level of 0 following the procedure. Post Debridement Measurements: 0.4cm length x 0.4cm width x 0.4cm depth; 0.05cm^3 volume. Character of Wound/Ulcer Post Debridement is improved. Severity of Tissue Post Debridement is: Fat layer exposed. Post procedure Diagnosis Wound #1: Same as Pre-Procedure Plan Follow-up Appointments: Return Appointment in 1 week. Dressing Change Frequency: Wound #1 Right T Great: oe Change dressing every day. Wound Cleansing: Wound #1 Right T Great: oe Clean wound with Normal Saline. Primary Wound Dressing: Wound #1 Right T Great: oe Hydrofera Blue Secondary Dressing: Wound #1 Right T Great: oe Kerlix/Rolled Gauze Dry Gauze 1. Still Hydrofera Blue to the wound although I think the issue here is offloading. 2. He shows me or at least his significant other shows me an abrasion on the PIP of the fourth toe which I think is also pressure. 3. I see no evidence of infection 4. It would appear that his femoropopliteal bypass is doing well based on his peripheral pulses and the warmth of his foot 5. I continue to think this is a offloading issue we talked about straightening the hammer deformity at the interphalangeal joint of the right great toe. If this wound gets worse he may need to see podiatry Electronic Signature(s) Signed: 02/21/2020 3:41:00 PM By:  [name] Entered By: [name] on 02/20/2020 08:44:37 -------------------------------------------------------------------------------- SuperBill Details Patient Name: Date of Service: [name] [place] 02/20/2020 Medical Record Number: [id number] Patient Account Number: [id number] Date of Birth/Sex: Treating [name]: October 17, 1950 (68 y.o. [name]) [name] Primary Care Provider: Creola [name] Other Clinician: Referring Provider: Treating Provider/Extender: [name] Leigh in Treatment: 4 Diagnosis Coding ICD-10 Codes Code Description [phone] Type 1 diabetes mellitus with foot ulcer E10.51 Type 1 diabetes mellitus with diabetic peripheral angiopathy without gangrene E10.42 Type 1 diabetes mellitus with diabetic polyneuropathy L97.512 Non-pressure chronic ulcer of other part of right foot with fat layer exposed Facility Procedures CPT4 Code: [id number] Description: [phone] - DEBRIDE WOUND 1ST 20 SQ CM OR < ICD-10 Diagnosis Description L97.512 Non-pressure chronic ulcer of other part of right foot with fat layer exposed Modifier: Quantity: 1 Physician Procedures : CPT4 Code Description Modifier [id number] - WC PHYS DEBR WO ANESTH 20 SQ CM ICD-10 Diagnosis Description L97.512 Non-pressure chronic ulcer of other part of right foot with fat layer exposed Quantity: 1 Electronic Signature(s) Signed: 02/21/2020 3:41:00 PM By: [name] Entered By: [name] on 02/20/2020 08:44:48

## 2020-02-21 NOTE — Progress Notes (Signed)
AKIM, WATKINSON ([id number]) Visit Report for 02/20/2020 Arrival Information Details Patient Name: Date of Service: LAYMON, STOCKERT 02/20/2020 8:00 A M Medical Record Number: [id number] Patient Account Number: [id number] Date of Birth/Sex: Treating RN: [birth year] (69 y.o. [name] Primary Care Jaslyne Beeck: [name] Other Clinician: Referring Sharonda Llamas: Treating Justun Anaya/Extender: Lacretia Leigh in Treatment: 4 Visit Information History Since Last Visit Added or deleted any medications: No Patient Arrived: Ambulatory Any new allergies or adverse reactions: No Arrival Time: 08:07 Had a fall or experienced change in No Accompanied By: wife activities of daily living that may affect Transfer Assistance: None risk of falls: Patient Identification Verified: Yes Signs or symptoms of abuse/neglect since last visito No Secondary Verification Process Completed: Yes Hospitalized since last visit: No Patient Requires Transmission-Based Precautions: No Implantable device outside of the clinic excluding No Patient Has Alerts: No cellular tissue based products placed in the center since last visit: Has Dressing in Place as Prescribed: No Pain Present Now: No Electronic Signature(s) Signed: 02/20/2020 5:23:36 PM By: Cherylin Mylar Entered By: Cherylin Mylar on 02/20/2020 08:08:20 -------------------------------------------------------------------------------- Encounter Discharge Information Details Patient Name: Date of Service: [name]. 02/20/2020 8:00 A M Medical Record Number: [id number] Patient Account Number: [id number] Date of Birth/Sex: Treating RN: [birth year] (70 y.o. [name] Primary Care Samina Weekes: [name] Other Clinician: Referring Jontavius Rabalais: Treating Toleen Lachapelle/Extender: Lacretia Leigh in Treatment: 4 Encounter Discharge Information Items Post Procedure Vitals Discharge Condition:  Stable Temperature (F): 98.5 Ambulatory Status: Ambulatory Pulse (bpm): 58 Discharge Destination: Home Respiratory Rate (breaths/min): 19 Transportation: Private Auto Blood Pressure (mmHg): 137/64 Accompanied By: wife Schedule Follow-up Appointment: Yes Clinical Summary of Care: Patient Declined Electronic Signature(s) Signed: 02/20/2020 5:23:36 PM By: Cherylin Mylar Entered By: Cherylin Mylar on 02/20/2020 08:52:19 -------------------------------------------------------------------------------- Lower Extremity Assessment Details Patient Name: Date of Service: CLARION, MOONEYHAN 02/20/2020 8:00 A M Medical Record Number: [id number] Patient Account Number: [id number] Date of Birth/Sex: Treating RN: 1950/09/16 (69 y.o. [name] Primary Care Kyleigha Markert: [name] Other Clinician: Referring Khyre Germond: Treating Forrest Demuro/Extender: Lacretia Leigh in Treatment: 4 Edema Assessment Assessed: Kyra Searles: No] Franne Forts: No] Edema: [Left: Ye] [Right: s] Calf Left: Right: Point of Measurement: cm From Medial Instep cm 36 cm Ankle Left: Right: Point of Measurement: cm From Medial Instep cm 23.5 cm Vascular Assessment Pulses: Dorsalis Pedis Palpable: [Right:Yes] Electronic Signature(s) Signed: 02/20/2020 5:23:36 PM By: Cherylin Mylar Entered By: Cherylin Mylar on 02/20/2020 08:09:38 -------------------------------------------------------------------------------- Multi Wound Chart Details Patient Name: Date of Service: [name]. 02/20/2020 8:00 A M Medical Record Number: [id number] Patient Account Number: [id number] Date of Birth/Sex: Treating RN: 06/24/1951 (68 y.o. [name]) Yevonne Pax Primary Care Scherry Laverne: [name] Other Clinician: Referring Kaleah Hagemeister: Treating Jensyn Shave/Extender: Lacretia Leigh in Treatment: 4 Vital Signs Height(in): 71 Pulse(bpm): 58 Weight(lbs): 152 Blood Pressure(mmHg): 137/64 Body Mass  Index(BMI): 21 Temperature(F): 98.5 Respiratory Rate(breaths/min): 19 Photos: [1:No Photos Right T Great oe] [N/A:N/A N/A] Wound Location: [1:Gradually Appeared] [N/A:N/A] Wounding Event: [1:Diabetic Wound/Ulcer of the Lower] [N/A:N/A] Primary Etiology: [1:Extremity Cataracts, Coronary Artery Disease,] [N/A:N/A] Comorbid History: [1:Peripheral Arterial Disease, Type I Diabetes, Osteomyelitis, Neuropathy 11/23/2019] [N/A:N/A] Date Acquired: [1:4] [N/A:N/A] Weeks of Treatment: [1:Open] [N/A:N/A] Wound Status: [1:0.4x0.4x0.4] [N/A:N/A] Measurements L x W x D (cm) [1:0.126] [N/A:N/A] A (cm) : rea [1:0.05] [N/A:N/A] Volume (cm) : [1:-6.80%] [N/A:N/A] % Reduction in A rea: [1:-316.70%] [N/A:N/A] % Reduction in Volume: [1:12] Starting Position 1 (o'clock): [1:12] Ending Position 1 (o'clock): [1:0.5] Maximum Distance 1 (cm): [1:Yes] [  N/A:N/A] Undermining: [1:Grade 2] [N/A:N/A] Classification: [1:Small] [N/A:N/A] Exudate A mount: [1:Serosanguineous] [N/A:N/A] Exudate Type: [1:red, brown] [N/A:N/A] Exudate Color: [1:Well defined, not attached] [N/A:N/A] Wound Margin: [1:Large (67-100%)] [N/A:N/A] Granulation A mount: [1:Red, Pink] [N/A:N/A] Granulation Quality: [1:None Present (0%)] [N/A:N/A] Necrotic A mount: [1:Fat Layer (Subcutaneous Tissue)] [N/A:N/A] Exposed Structures: [1:Exposed: Yes Fascia: No Tendon: No Muscle: No Joint: No Bone: No None] [N/A:N/A] Epithelialization: [1:Debridement - Excisional] [N/A:N/A] Debridement: Pre-procedure Verification/Time Out 08:34 [N/A:N/A] Taken: [1:Lidocaine 5% topical ointment] [N/A:N/A] Pain Control: [1:Callus, Subcutaneous] [N/A:N/A] Tissue Debrided: [1:Skin/Subcutaneous Tissue] [N/A:N/A] Level: [1:0.16] [N/A:N/A] Debridement A (sq cm): [1:rea Curette] [N/A:N/A] Instrument: [1:Moderate] [N/A:N/A] Bleeding: [1:Pressure] [N/A:N/A] Hemostasis A chieved: [1:0] [N/A:N/A] Procedural Pain: [1:0] [N/A:N/A] Post Procedural Pain: [1:Procedure was  tolerated well] [N/A:N/A] Debridement Treatment Response: [1:0.4x0.4x0.4] [N/A:N/A] Post Debridement Measurements L x W x D (cm) [1:0.05] [N/A:N/A] Post Debridement Volume: (cm) [1:Debridement] [N/A:N/A] Treatment Notes Electronic Signature(s) Signed: 02/20/2020 5:22:48 PM By: Yevonne Pax RN Signed: 02/21/2020 3:41:00 PM By: Baltazar Najjar MD Entered By: Baltazar Najjar on 02/20/2020 08:40:41 -------------------------------------------------------------------------------- Multi-Disciplinary Care Plan Details Patient Name: Date of Service: [name]. 02/20/2020 8:00 A M Medical Record Number: [id number] Patient Account Number: [id number] Date of Birth/Sex: Treating RN: 1951/07/13 (68 y.o. [name] Primary Care Carmino Ocain: [name] Other Clinician: Referring Rosalynd Mcwright: Treating Gage Weant/Extender: Lacretia Leigh in Treatment: 4 Active Inactive Wound/Skin Impairment Nursing Diagnoses: Knowledge deficit related to ulceration/compromised skin integrity Goals: Patient/caregiver will verbalize understanding of skin care regimen Date Initiated: 01/23/2020 Target Resolution Date: 02/22/2020 Goal Status: Active Ulcer/skin breakdown will have a volume reduction of 30% by week 4 Date Initiated: 01/23/2020 Target Resolution Date: 02/22/2020 Goal Status: Active Interventions: Assess patient/caregiver ability to obtain necessary supplies Assess patient/caregiver ability to perform ulcer/skin care regimen upon admission and as needed Assess ulceration(s) every visit Notes: Electronic Signature(s) Signed: 02/20/2020 5:22:48 PM By: Yevonne Pax RN Entered By: Yevonne Pax on 02/20/2020 08:04:09 -------------------------------------------------------------------------------- Pain Assessment Details Patient Name: Date of Service: LAURIE, LOVEJOY 02/20/2020 8:00 A M Medical Record Number: [id number] Patient Account Number: [id number] Date of Birth/Sex: Treating  RN: 1951-04-02 (69 y.o. [name] Primary Care Latamara Melder: [name] Other Clinician: Referring Thaddeaus Monica: Treating Martez Weiand/Extender: Lacretia Leigh in Treatment: 4 Active Problems Location of Pain Severity and Description of Pain Patient Has Paino No Site Locations Pain Management and Medication Current Pain Management: Electronic Signature(s) Signed: 02/20/2020 5:23:36 PM By: Cherylin Mylar Entered By: Cherylin Mylar on 02/20/2020 08:08:47 -------------------------------------------------------------------------------- Patient/Caregiver Education Details Patient Name: Date of Service: [name] 6/29/2021andnbsp8:00 A M Medical Record Number: [id number] Patient Account Number: [id number] Date of Birth/Gender: Treating RN: 1951/03/11 (68 y.o. [name] Primary Care Physician: [name] Other Clinician: Referring Physician: Treating Physician/Extender: Lacretia Leigh in Treatment: 4 Education Assessment Education Provided To: Patient Education Topics Provided Wound/Skin Impairment: Methods: Explain/Verbal Responses: State content correctly Electronic Signature(s) Signed: 02/20/2020 5:22:48 PM By: Yevonne Pax RN Entered By: Yevonne Pax on 02/20/2020 08:04:30 -------------------------------------------------------------------------------- Wound Assessment Details Patient Name: Date of Service: ACEL, NATZKE 02/20/2020 8:00 A M Medical Record Number: [id number] Patient Account Number: [id number] Date of Birth/Sex: Treating RN: 1951/07/05 (69 y.o. [name] Primary Care Yuko Coventry: [name] Other Clinician: Referring Hanz Winterhalter: Treating Tarry Fountain/Extender: Lacretia Leigh in Treatment: 4 Wound Status Wound Number: 1 Primary Diabetic Wound/Ulcer of the Lower Extremity Etiology: Wound Location: Right T Great oe Wound Open Wounding Event: Gradually  Appeared Status: Date Acquired: 11/23/2019 Comorbid Cataracts, Coronary Artery Disease, Peripheral Arterial  Disease, Weeks Of Treatment: 4 History: Type I Diabetes, Osteomyelitis, Neuropathy Clustered Wound: No Wound Measurements Length: (cm) 0.4 Width: (cm) 0.4 Depth: (cm) 0.4 Area: (cm) 0.126 Volume: (cm) 0.05 % Reduction in Area: -6.8% % Reduction in Volume: -316.7% Epithelialization: None Tunneling: No Undermining: Yes Starting Position (o'clock): 12 Ending Position (o'clock): 12 Maximum Distance: (cm) 0.5 Wound Description Classification: Grade 2 Wound Margin: Well defined, not attached Exudate Amount: Small Exudate Type: Serosanguineous Exudate Color: red, brown Foul Odor After Cleansing: No Slough/Fibrino Yes Wound Bed Granulation Amount: Large (67-100%) Exposed Structure Granulation Quality: Red, Pink Fascia Exposed: No Necrotic Amount: None Present (0%) Fat Layer (Subcutaneous Tissue) Exposed: Yes Tendon Exposed: No Muscle Exposed: No Joint Exposed: No Bone Exposed: No Treatment Notes Wound #1 (Right Toe Great) 1. Cleanse With Wound Cleanser 2. Periwound Care Skin Prep 3. Primary Dressing Applied Hydrofera Blue 4. Secondary Dressing Dry Gauze Roll Gauze 5. Secured With Tape Notes Government social research officer) Signed: 02/20/2020 5:23:36 PM By: Cherylin Mylar Entered By: Cherylin Mylar on 02/20/2020 08:13:28 -------------------------------------------------------------------------------- Vitals Details Patient Name: Date of Service: [name]. 02/20/2020 8:00 A M Medical Record Number: [id number] Patient Account Number: [id number] Date of Birth/Sex: Treating RN: July 25, 1951 (69 y.o. [name] Primary Care Theodore Virgin: [name] Other Clinician: Referring Imad Shostak: Treating Terik Haughey/Extender: Lacretia Leigh in Treatment: 4 Vital Signs Time Taken: 08:05 Temperature (F): 98.5 Height (in): 71 Pulse  (bpm): 58 Weight (lbs): 152 Respiratory Rate (breaths/min): 19 Body Mass Index (BMI): 21.2 Blood Pressure (mmHg): 137/64 Reference Range: 80 - 120 mg / dl Electronic Signature(s) Signed: 02/20/2020 5:23:36 PM By: Cherylin Mylar Entered By: Cherylin Mylar on 02/20/2020 08:08:41

## 2020-03-04 ENCOUNTER — Ambulatory Visit (HOSPITAL_COMMUNITY)
Admission: RE | Admit: 2020-03-04 | Discharge: 2020-03-04 | Disposition: A | Discharge status: Home / Self Care | Payer: Medicare Other | Source: Ambulatory Visit | Attending: Surgery | Admitting: Surgery

## 2020-03-04 ENCOUNTER — Ambulatory Visit (INDEPENDENT_AMBULATORY_CARE_PROVIDER_SITE_OTHER): Payer: Medicare Other | Admitting: Physician Assistant

## 2020-03-04 ENCOUNTER — Other Ambulatory Visit: Payer: Self-pay

## 2020-03-04 ENCOUNTER — Ambulatory Visit (INDEPENDENT_AMBULATORY_CARE_PROVIDER_SITE_OTHER)
Admission: RE | Admit: 2020-03-04 | Discharge: 2020-03-04 | Disposition: A | Discharge status: Home / Self Care | Payer: Medicare Other | Source: Ambulatory Visit | Attending: Surgery | Admitting: Surgery

## 2020-03-04 VITALS — BP 141/69 | HR 59 | Temp 96.6°F | Resp 20 | Ht 71.0 in | Wt 152.1 lb

## 2020-03-04 DIAGNOSIS — I739 Peripheral vascular disease, unspecified: Secondary | ICD-10-CM | POA: Insufficient documentation

## 2020-03-04 NOTE — Progress Notes (Signed)
Office Note     CC:  follow up Requesting Provider:  [name], MD  HPI: [name] is a 69 y.o. (10-25-1950) male who is status post right femoral to below-knee popliteal artery bypass graft with ipsilateral translocated saphenous vein and extensive endarterectomy of the right distal external iliac, common femoral, and profundofemoral artery on 11-15-2019 for a right toe ulcer.  His post operative course was uncomplicated.  Post op ABI was 1.16  He is s/p left BKA.  He denies lower extremity pain or rest pain. Mild edema of right lower leg and foot which is resolved in the mornings. He is being seen at wound care clinic by Dr. Leanord Hawking. Last visit was 02/20/2020. Peri-wound callous pared down. Negative bone test. Wound dressed with Hydrofera blue.  The patient continues to smoke. He takes a statin for hypercholesterolemia. He is medically managed for hypertension. 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 He also suffers from coronary artery disease. He has seen Dr. Jacinto Halim   Past Medical History:  Diagnosis Date   Cigarette smoker    Coronary artery disease    Diabetes mellitus    type 1  x 50 yrs   Diabetic retinopathy (HCC)    Dyslipidemia    Hypertension    Peripheral vascular disease (HCC)     Past Surgical History:  Procedure Laterality Date   ABDOMINAL ANGIOGRAM N/A 05/29/2014   Procedure: ABDOMINAL ANGIOGRAM;  Surgeon: Pamella Pert, MD;  Location: [facility] CATH LAB;  Service: Cardiovascular;  Laterality: N/A;   ABDOMINAL AORTOGRAM W/LOWER EXTREMITY N/A 11/14/2019   Procedure: ABDOMINAL AORTOGRAM W/LOWER EXTREMITY;  Surgeon: Nada Libman, MD;  Location: [facility] INVASIVE CV LAB;  Service: Cardiovascular;  Laterality: N/A;   AMPUTATION Left 10/25/2015   Procedure: Left Foot 5th Ray Amputation;  Surgeon: Nadara Mustard, MD;  Location: [facility] OR;  Service: Orthopedics;  Laterality: Left;   AMPUTATION Left 12/06/2015   Procedure: AMPUTATION BELOW KNEE;   Surgeon: Nadara Mustard, MD;  Location: [facility] OR;  Service: Orthopedics;  Laterality: Left;   CARDIAC CATHETERIZATION     EF is 55-60% and no wall motion abnormalities (long long time ago)   FEMORAL-POPLITEAL BYPASS GRAFT Left 10/24/2015   Procedure: BYPASS GRAFT FEMORAL below knee POPLITEAL ARTERY with Left Saphenous Vein;  Surgeon: Nada Libman, MD;  Location: [facility] OR;  Service: Vascular;  Laterality: Left;   FEMORAL-POPLITEAL BYPASS GRAFT Right 11/15/2019   Procedure: BYPASS GRAFT FEMORAL-POPLITEAL ARTERY using Right Leg Greater Saphenous Vein;  Surgeon: Nada Libman, MD;  Location: [facility] OR;  Service: Vascular;  Laterality: Right;   FRACTURE SURGERY     left arm "many yrs ago"   LOWER EXTREMITY ANGIOGRAM N/A 01/23/2014   Procedure: LOWER EXTREMITY ANGIOGRAM;  Surgeon: Pamella Pert, MD;  Location: [facility] CATH LAB;  Service: Cardiovascular;  Laterality: N/A;   LOWER EXTREMITY ANGIOGRAM N/A 07/31/2014   Procedure: LOWER EXTREMITY ANGIOGRAM;  Surgeon: Pamella Pert, MD;  Location: [facility] CATH LAB;  Service: Cardiovascular;  Laterality: N/A;   LOWER EXTREMITY ANGIOGRAM Left 10/11/2015   Procedure: Lower Extremity Angiogram;  Surgeon: Nada Libman, MD;  Location: [facility] INVASIVE CV LAB;  Service: Cardiovascular;  Laterality: Left;   PERIPHERAL VASCULAR BALLOON ANGIOPLASTY  11/14/2019   Procedure: PERIPHERAL VASCULAR BALLOON ANGIOPLASTY;  Surgeon: Nada Libman, MD;  Location: [facility] INVASIVE CV LAB;  Service: Cardiovascular;;   PERIPHERAL VASCULAR CATHETERIZATION Left 10/11/2015   Procedure: Peripheral Vascular Balloon Angioplasty;  Surgeon: Fran Lowes  Myra Gianotti, MD;  Location: [facility] INVASIVE CV LAB;  Service: Cardiovascular;  Laterality: Left;  sfa failed unable to cross occluded sfa   PERIPHERAL VASCULAR CATHETERIZATION N/A 10/11/2015   Procedure: Abdominal Aortogram;  Surgeon: Nada Libman, MD;  Location: [facility] INVASIVE CV LAB;  Service: Cardiovascular;  Laterality: N/A;   STUMP REVISION Left 03/02/2016     Procedure: Revision Left Below Knee Amputation;  Surgeon: Nadara Mustard, MD;  Location: [facility] OR;  Service: Orthopedics;  Laterality: Left;    Social History   Socioeconomic History   Marital status: Widowed    Spouse name: Not on file   Number of children: 2   Years of education: Not on file   Highest education level: Not on file  Occupational History   Not on file  Tobacco Use   Smoking status: Current Every Day Smoker    Packs/day: 1.00    Years: 30.00    Pack years: 30.00    Types: Cigarettes   Smokeless tobacco: Never Used  Vaping Use   Vaping Use: Never used  Substance and Sexual Activity   Alcohol use: Yes    Comment: on occasion wine   Drug use: No   Sexual activity: Not on file  Other Topics Concern   Not on file  Social History Narrative   Not on file   Social Determinants of Health   Financial Resource Strain:    Difficulty of Paying Living Expenses:   Food Insecurity:    Worried About Programme researcher, broadcasting/film/video in the Last Year:    Barista in the Last Year:   Transportation Needs:    Freight forwarder (Medical):    Lack of Transportation (Non-Medical):   Physical Activity:    Days of Exercise per Week:    Minutes of Exercise per Session:   Stress:    Feeling of Stress :   Social Connections:    Frequency of Communication with Friends and Family:    Frequency of Social Gatherings with Friends and Family:    Attends Religious Services:    Active Member of Clubs or Organizations:    Attends Engineer, structural:    Marital Status:   Intimate Partner Violence:    Fear of Current or Ex-Partner:    Emotionally Abused:    Physically Abused:    Sexually Abused:    Family History  Problem Relation Age of Onset   Coronary artery disease Mother     Current Outpatient Medications  Medication Sig Dispense Refill   acetaminophen (TYLENOL) 500 MG tablet Take 1,000 mg by mouth 2 (two) times daily as needed  (pain).      amLODipine (NORVASC) 5 MG tablet TAKE 1 TABLET BY MOUTH  DAILY (Patient taking differently: Take 5 mg by mouth daily. ) 90 tablet 3   aspirin EC 81 MG tablet Take 243 mg by mouth daily.      baclofen (LIORESAL) 20 MG tablet Take 20 mg by mouth daily as needed (for leg cramps). Reported on 12/05/2015  0   calcium carbonate (TUMS - DOSED IN MG ELEMENTAL CALCIUM) 500 MG chewable tablet Chew 2 tablets by mouth every 6 (six) hours as needed for indigestion or heartburn.     folic acid (FOLVITE) 800 MCG tablet Take 1,600 mcg by mouth daily.      HUMALOG 100 UNIT/ML injection PUMP     metoprolol succinate (TOPROL-XL) 25 MG 24 hr tablet Take 1 tablet (25 mg total) by mouth  daily. 30 tablet 1   Multiple Vitamins-Minerals (MULTIVITAMIN GUMMIES ADULT PO) Take 2 tablets by mouth daily.      NOVOLOG 100 UNIT/ML injection Inject into the skin continuous. Via insulin pump  0   oxyCODONE (OXY IR/ROXICODONE) 5 MG immediate release tablet Take 1 tablet (5 mg total) by mouth every 6 (six) hours as needed for moderate pain. 30 tablet 0   ramipril (ALTACE) 10 MG tablet Take 10 mg by mouth daily.       rosuvastatin (CRESTOR) 10 MG tablet TAKE 1 TABLET BY MOUTH  DAILY (Patient taking differently: Take 10 mg by mouth daily. ) 90 tablet 3   sulfamethoxazole-trimethoprim (BACTRIM DS) 800-160 MG tablet Take 1 tablet by mouth 2 (two) times daily.     No current facility-administered medications for this visit.    Allergies  Allergen Reactions   Simvastatin Other (See Comments)    MYALGIAS, MUSCLE WEAKNESS      REVIEW OF SYSTEMS:   [X]  denotes positive finding, [ ]  denotes negative finding Cardiac  Comments:  Chest pain or chest pressure:    Shortness of breath upon exertion:    Short of breath when lying flat:    Irregular heart rhythm:        Vascular    Pain in calf, thigh, or hip brought on by ambulation:    Pain in feet at night that wakes you up from your sleep:     Blood  clot in your veins:    Leg swelling:  x See HPI      Pulmonary    Oxygen at home:    Productive cough:     Wheezing:         Neurologic    Sudden weakness in arms or legs:     Sudden numbness in arms or legs:     Sudden onset of difficulty speaking or slurred speech:    Temporary loss of vision in one eye:     Problems with dizziness:         Gastrointestinal    Blood in stool:     Vomited blood:         Genitourinary    Burning when urinating:     Blood in urine:        Psychiatric    Major depression:         Hematologic    Bleeding problems:    Problems with blood clotting too easily:        Skin    Rashes or ulcers:        Constitutional    Fever or chills:      PHYSICAL EXAMINATION:  Vitals:   03/04/20 1258  BP: (!) 141/69  Pulse: (!) 59  Resp: 20  Temp: (!) 96.6 F (35.9 C)  SpO2: 99%   General:  WDWN in NAD; vital signs documented above Gait: No ataxia, unaided (left prosthesis) HENT: WNL, normocephalic Pulmonary: normal non-labored breathing , without Rales, rhonchi,  wheezing Cardiac: regular HR, without  Murmurs  Skin: without rashes Vascular Exam/Pulses: 2+ bilateral femoral artery pulses Extremities: with chronic ischemic changes, without Gangrene , without cellulitis; with open right great toe wound; healing without drainage or signs of infection. Mild right lower leg/foot edema. Incisions healed. Musculoskeletal: no muscle wasting or atrophy  Neurologic: A&O X 3;  No focal weakness or paresthesias are detected Psychiatric:  The pt has Normal affect.     Non-Invasive Vascular Imaging:   03/04/2020: ABI Findings:  +---------+------------------+-----+----------+--------+  Right   Rt Pressure (mmHg) Index Waveform  Comment    +---------+------------------+-----+----------+--------+   Brachial  148                          +---------+------------------+-----+----------+--------+   PTA    252         1.70   biphasic         +---------+------------------+-----+----------+--------+   DP     >254        1.72  monophasic        +---------+------------------+-----+----------+--------+   Great Toe                     Bandage    +---------+------------------+-----+----------+--------+   +--------+------------------+-----+--------+-------+   Left   Lt Pressure (mmHg) Index Waveform Comment   +--------+------------------+-----+--------+-------+   Brachial 144                         +--------+------------------+-----+--------+-------+    Summary:  Right: Resting right ankle-brachial index indicates noncompressible right  lower extremity arteries. Waveforms suggest adequate perfusion.   RLE duplex:  Summary:  Right: Patent femoral to popliteal bypass graft with no evidence for  restenosis.   ASSESSMENT/PLAN:: 69 y.o. male here for follow up for approximately 4 months status post right femoral to below-knee popliteal artery bypass with ipsilateral vein.  His toe wound is nearly healed.  He continues care at the wound care clinic and his domestic partner assist with dressing changes.  ABIs noncompressible.  Duplex study reveals patent graft.  Discussed compliance with medications, wound care and smoking cessation.  Rec; elevation of right lower extremity for dependent edema. Follow-up in 6 months or sooner if wound deteriorates or signs and symptoms of limb ischemia occur.  Patient is in agreement with this plan.  Milinda AntisSandra J Ryli Standlee, PA-C [facility] [phone]  Clinic MD:   Myra GianottiBrabham

## 2020-03-05 ENCOUNTER — Other Ambulatory Visit: Payer: Self-pay | Admitting: *Deleted

## 2020-03-05 ENCOUNTER — Encounter (HOSPITAL_BASED_OUTPATIENT_CLINIC_OR_DEPARTMENT_OTHER): Payer: Medicare Other | Attending: Internal Medicine | Admitting: Internal Medicine

## 2020-03-05 DIAGNOSIS — I10 Essential (primary) hypertension: Secondary | ICD-10-CM | POA: Insufficient documentation

## 2020-03-05 DIAGNOSIS — E1042 Type 1 diabetes mellitus with diabetic polyneuropathy: Secondary | ICD-10-CM | POA: Insufficient documentation

## 2020-03-05 DIAGNOSIS — Z89512 Acquired absence of left leg below knee: Secondary | ICD-10-CM | POA: Diagnosis not present

## 2020-03-05 DIAGNOSIS — I2510 Atherosclerotic heart disease of native coronary artery without angina pectoris: Secondary | ICD-10-CM | POA: Diagnosis not present

## 2020-03-05 DIAGNOSIS — E1051 Type 1 diabetes mellitus with diabetic peripheral angiopathy without gangrene: Secondary | ICD-10-CM | POA: Insufficient documentation

## 2020-03-05 DIAGNOSIS — I739 Peripheral vascular disease, unspecified: Secondary | ICD-10-CM

## 2020-03-05 DIAGNOSIS — E10621 Type 1 diabetes mellitus with foot ulcer: Secondary | ICD-10-CM | POA: Diagnosis present

## 2020-03-05 DIAGNOSIS — E7800 Pure hypercholesterolemia, unspecified: Secondary | ICD-10-CM | POA: Diagnosis not present

## 2020-03-05 DIAGNOSIS — L97512 Non-pressure chronic ulcer of other part of right foot with fat layer exposed: Secondary | ICD-10-CM | POA: Insufficient documentation

## 2020-03-05 DIAGNOSIS — I7025 Atherosclerosis of native arteries of other extremities with ulceration: Secondary | ICD-10-CM

## 2020-03-05 NOTE — Progress Notes (Addendum)
ACEYN, KATHOL ([id number]) Visit Report for 03/05/2020 HPI Details Patient Name: Date of Service: [name], [name] 03/05/2020 8:00 A M Medical Record Number: [id number] Patient Account Number: [id number] Date of Birth/Sex: Treating RN: 04-28-1951 (68 y.o. 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 He is a type I diabetic on an insulin pump diagnosed at age 32. He also has known peripheral neuropathy. He has been dealing with a wound on his right first toe since at least February by review of epic. He was seen by Dr. Lajoyce Corners. Diagnosed with an ischemic wound sent to see Dr. Myra Gianotti him who noted a noncompressible ABI 1.67 and a TBI of 0.25 monophasic waveforms. On 11/15/2019 he underwent a right femoral to below-knee popliteal bypass with an ipsilateral saphenous vein. He also underwent extensive endarterectomy I believe that the external iliac. Postoperative ABI was 1.16 he has been using Neosporin on this and gradually making progress according to his companion. He is offloading this with a hole in the inserts in his work shoes. Per his companion who does the dressings the wound has been getting smaller Past medical history; type 1 diabetes with PAD and PN, history of a left BKA in 2017, coronary artery disease, hypercholesterolemia, hypertension he is a smoker 6/29; this is a patient I have not seen in 4 weeks. He is a type I diabetic on an insulin pump with a prior left BKA. He was revascularized before he came to our clinic with a right femoral to below-knee popliteal bypass with a saphenous vein. He also had an extensive endarterectomy. We used Hydrofera Blue to this wound. 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 Wears  work boots. 7/13; type I diabetic with an area on the plantar tip of his right great toe in the setting of a previous left BKA and extensive endarterectomy with a right femoral to below-knee popliteal bypass before he came to our clinic. We have been using Hydrofera Blue the wound is measuring small. 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 [name] Primary Care Provider: [name] Other Clinician: Referring Provider: Treating Provider/Extender: Lacretia Leigh in Treatment: 6 Constitutional Patient is hypertensive.. Pulse regular and within target range for patient.Marland Kitchen Respirations regular, non-labored and within target range.. Temperature is normal and within the target range for the patient.Marland Kitchen Appears in no distress. Notes Wound exam Small wound in the tip of the right first toe. Some callus and thick skin generally healthy looking wound bed. No debridement Electronic Signature(s) Signed: 03/05/2020 5:16:39 PM By: Baltazar Najjar MD Entered By: Baltazar Najjar on 03/05/2020 09:06:46 -------------------------------------------------------------------------------- Physician Orders Details Patient Name: Date of Service: [name] 03/05/2020 8:00 A M Medical Record Number: [id number] Patient Account Number: [id number] Date of Birth/Sex: Treating RN: May 24, 1951 (69 y.o. [name] Primary Care Provider: [name] Other Clinician: Referring Provider: Treating  Provider/Extender: Rennie Natter,  Vivianne Spence in Treatment: 6 Verbal / Phone Orders: No Diagnosis Coding ICD-10 Coding Code Description E10.621 Type 1 diabetes mellitus with foot ulcer E10.51 Type 1 diabetes mellitus with diabetic peripheral angiopathy without gangrene E10.42 Type 1 diabetes mellitus with diabetic polyneuropathy L97.512 Non-pressure chronic ulcer of other part of right foot with fat layer exposed Follow-up Appointments Return Appointment in 2 weeks. Dressing Change Frequency Wound #1 Right T Great oe Change dressing every day. Wound Cleansing Wound #1 Right T Great oe Clean wound with Normal Saline. Primary Wound Dressing Wound #1 Right T Great oe Hydrofera Blue Secondary Dressing Wound #1 Right T Great oe Kerlix/Rolled Gauze Dry Gauze Electronic Signature(s) Signed: 03/05/2020 5:16:39 PM By: Baltazar Najjar MD Signed: 03/05/2020 5:57:23 PM By: [name] Pax RN Entered By: [name] on 03/05/2020 08:54:36 -------------------------------------------------------------------------------- Problem List Details Patient Name: Date of Service: [name]. 03/05/2020 8:00 A M Medical Record Number: [id number] Patient Account Number: [id number] Date of Birth/Sex: Treating RN: [birth year] (69 y.o. [name] Primary Care Provider: [name] Other Clinician: Referring Provider: Treating Provider/Extender: Rennie Natter, Vivianne Spence in Treatment: 6 Active Problems ICD-10 Encounter Code Description Active Date MDM Diagnosis E10.621 Type 1 diabetes mellitus with foot ulcer 01/23/2020 No Yes E10.51 Type 1 diabetes mellitus with diabetic peripheral angiopathy without gangrene 01/23/2020 No Yes E10.42 Type 1 diabetes mellitus with diabetic polyneuropathy 01/23/2020 No Yes L97.512 Non-pressure chronic ulcer of other part of right foot with fat layer exposed 01/23/2020 No Yes Inactive Problems Resolved Problems Electronic Signature(s) Signed:  03/05/2020 5:16:39 PM By: Baltazar Najjar MD Entered By: Baltazar Najjar on 03/05/2020 09:03:50 -------------------------------------------------------------------------------- Progress Note Details Patient Name: Date of Service: [name] 03/05/2020 8:00 A M Medical Record Number: [id number] Patient Account Number: [id number] Date of Birth/Sex: Treating RN: 1951-07-05 (68 y.o. 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 He is a type I diabetic on an insulin pump diagnosed at age 63. He also has known peripheral neuropathy. He has been dealing with a wound on his right first toe since at least February by review of epic. He was seen by Dr. Lajoyce Corners. Diagnosed with an ischemic wound sent to see Dr. Myra Gianotti him who noted a noncompressible ABI 1.67 and a TBI of 0.25 monophasic waveforms. On 11/15/2019 he underwent a right femoral to below-knee popliteal bypass with an ipsilateral saphenous vein. He also underwent extensive endarterectomy I believe that the external iliac. Postoperative ABI was 1.16 he has been using Neosporin on this and gradually making progress according to his companion. He is offloading this with a hole in the inserts in his work shoes. Per his companion who does the dressings the wound has been getting smaller Past medical history; type 1 diabetes with PAD and PN, history of a left BKA in 2017, coronary artery disease, hypercholesterolemia, hypertension he is a smoker 6/29; this is a patient I have not seen in 4 weeks. He is a type I diabetic on an insulin pump with a prior left BKA. He was revascularized before he came to our clinic with a right femoral to below-knee popliteal bypass with a saphenous  vein. He also had an extensive endarterectomy. We used Hydrofera Blue to this wound. 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 Wears work boots. 7/13;  type I diabetic with an area on the plantar tip of his right great toe in the setting of a previous left BKA and extensive endarterectomy with a right femoral to below-knee popliteal bypass before he came to our clinic. We have been using Hydrofera Blue the wound is measuring small. 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 Pulse regular and within target range for patient.Marland Kitchen Respirations regular, non-labored and within target range.. Temperature is normal and within the target range for the patient.Marland Kitchen Appears in no distress. Vitals Time Taken: 8:25 AM, Height: 71 in, Source: Stated, Weight: 152 lbs, Source: Stated, BMI: 21.2, Temperature: 98.0 F, Pulse: 58 bpm, Respiratory Rate: 18 breaths/min, Blood Pressure: 143/61 mmHg. General Notes: Wound exam ooSmall wound in the tip of the right first toe. Some callus and thick skin generally healthy looking wound bed. No debridement Integumentary (Hair, Skin) Wound #1 status is Open. Original cause of wound was Gradually Appeared. The wound is located on the Right T Great. The wound measures 0.5cm length x oe 0.4cm width x 0.2cm depth; 0.157cm^2 area and 0.031cm^3 volume. There is Fat Layer (Subcutaneous Tissue) Exposed exposed. There is no tunneling or undermining noted. There is a small amount of serosanguineous drainage noted. The wound margin is flat and intact. There is large (67-100%) red, pink granulation within the wound bed. There is a small (1-33%) amount of necrotic tissue within the wound bed including Adherent Slough. Assessment Active Problems ICD-10 Type 1 diabetes mellitus with foot ulcer Type 1 diabetes mellitus with diabetic peripheral angiopathy without gangrene Type 1  diabetes mellitus with diabetic polyneuropathy Non-pressure chronic ulcer of other part of right foot with fat layer exposed Plan Follow-up Appointments: Return Appointment in 2 weeks. Dressing Change Frequency: Wound #1 Right T Great: oe Change dressing every day. Wound Cleansing: Wound #1 Right T Great: oe Clean wound with Normal Saline. Primary Wound Dressing: Wound #1 Right T Great: oe Hydrofera Blue Secondary Dressing: Wound #1 Right T Great: oe Kerlix/Rolled Gauze Dry Gauze 1. Continue with Hydrofera Blue 2. Curlex and rolled gauze 3. Asked him to be vigilant about offloading this area. His significant other asked about options with regards to prosthetics to help keep the toe straight. I suggested simply larger shoes perhaps giving him more for food forefoot depth Electronic Signature(s) Signed: 03/05/2020 5:16:39 PM By: Baltazar Najjar MD Entered By: Baltazar Najjar on 03/05/2020 09:07:47 -------------------------------------------------------------------------------- SuperBill Details Patient Name: Date of Service: [name] 03/05/2020 Medical Record Number: [id number] Patient Account Number: [id number] Date of Birth/Sex: Treating RN: [birth year] (68 y.o. [name]) [name] Primary Care Provider: [name] Other Clinician: Referring Provider: Treating Provider/Extender: Lacretia Leigh in Treatment: 6 Diagnosis Coding ICD-10 Codes Code Description [phone] Type 1 diabetes mellitus with foot ulcer E10.51 Type 1 diabetes mellitus with diabetic peripheral angiopathy without gangrene E10.42 Type 1 diabetes mellitus with diabetic polyneuropathy L97.512 Non-pressure chronic ulcer of other part of right foot with fat layer exposed Facility Procedures CPT4 Code: [id number] Description: 99213 - WOUND CARE VISIT-LEV 3 EST PT Modifier: Quantity: 1 Physician Procedures : CPT4 Code Description Modifier [id number] 99213 - WC PHYS LEVEL 3 - EST PT ICD-10  Diagnosis Description E10.621 Type 1 diabetes mellitus with foot ulcer L97.512 Non-pressure chronic ulcer of other part of right foot with fat layer exposed Quantity: 1 Electronic Signature(s) Signed: 03/05/2020 5:16:39 PM By: Baltazar Najjar MD Signed:  03/05/2020 5:57:23 PM By: [name] PaxEpps, Carrie RN Entered By: [name] PaxEpps, Carrie on 03/05/2020 09:49:13

## 2020-03-11 NOTE — Progress Notes (Signed)
[name], [name] ([id number]) Visit Report for 03/05/2020 Arrival Information Details Patient Name: Date of Service: [name], [name] 03/05/2020 8:00 A M Medical Record Number: [id number] Patient Account Number: [id number] Date of Birth/Sex: Treating RN: 06-26-1951 (69 y.o. [name], [name] Primary Care Derisha Funderburke: [name] Other Clinician: Referring Antwyne Pingree: Treating Cassey Hurrell/Extender: [name] in Treatment: 6 Visit Information History Since Last Visit Added or deleted any medications: No Patient Arrived: Ambulatory Any new allergies or adverse reactions: No Arrival Time: 08:21 Had a fall or experienced change in No Accompanied By: spouse activities of daily living that may affect Transfer Assistance: None risk of falls: Patient Identification Verified: Yes Signs or symptoms of abuse/neglect since last visito No Secondary Verification Process Completed: Yes Hospitalized since last visit: No Patient Requires Transmission-Based Precautions: No Implantable device outside of the clinic excluding No Patient Has Alerts: No cellular tissue based products placed in the center since last visit: Has Dressing in Place as Prescribed: Yes Pain Present Now: No Electronic Signature(s) Signed: 03/05/2020 5:34:53 PM By: Baruch Gouty RN, BSN Entered By: Baruch Gouty on 03/05/2020 08:25:06 -------------------------------------------------------------------------------- Clinic Level of Care Assessment Details Patient Name: Date of Service: ADVAIT, BUICE 03/05/2020 8:00 A M Medical Record Number: [id number] Patient Account Number: [id number] Date of Birth/Sex: Treating RN: Dec 28, 1950 (68 y.o. [name]) [name] Primary Care Kiyanna Biegler: [name] Other Clinician: Referring Annitta Fifield: Treating Hugh Kamara/Extender: [name] in Treatment: 6 Clinic Level of Care Assessment Items TOOL 4 Quantity Score X- 1 0 Use when only an EandM is  performed on FOLLOW-UP visit ASSESSMENTS - Nursing Assessment / Reassessment X- 1 10 Reassessment of Co-morbidities (includes updates in patient status) X- 1 5 Reassessment of Adherence to Treatment Plan ASSESSMENTS - Wound and Skin A ssessment / Reassessment X - Simple Wound Assessment / Reassessment - one wound 1 5 _0  - 0 Complex Wound Assessment / Reassessment - multiple wounds _1  - 0 Dermatologic / Skin Assessment (not related to wound area) ASSESSMENTS - Focused Assessment _2  - 0 Circumferential Edema Measurements - multi extremities _3  - 0 Nutritional Assessment / Counseling / Intervention _4  - 0 Lower Extremity Assessment (monofilament, tuning fork, pulses) _5  - 0 Peripheral Arterial Disease Assessment (using hand held doppler) ASSESSMENTS - Ostomy and/or Continence Assessment and Care _6  - 0 Incontinence Assessment and Management _7  - 0 Ostomy Care Assessment and Management (repouching, etc.) PROCESS - Coordination of Care X - Simple Patient / Family Education for ongoing care 1 15 _8  - 0 Complex (extensive) Patient / Family Education for ongoing care X- 1 10 Staff obtains Consents, Records, T Results / Process Orders est _9  - 0 Staff telephones HHA, Nursing Homes / Clarify orders / etc _10  - 0 Routine Transfer to another Facility (non-emergent condition) _11  - 0 Routine Hospital Admission (non-emergent condition) _12  - 0 New Admissions / Biomedical engineer / Ordering NPWT Apligraf, etc. , _13  - 0 Emergency Hospital Admission (emergent condition) X- 1 10 Simple Discharge Coordination _14  - 0 Complex (extensive) Discharge Coordination PROCESS - Special Needs _15  - 0 Pediatric / Minor Patient Management _16  - 0 Isolation Patient Management _17  - 0 Hearing / Language / Visual special needs _18  - 0 Assessment of Community assistance (transportation, D/C planning, etc.) _19  - 0 Additional assistance / Altered mentation _20  - 0 Support Surface(s) Assessment  (bed, cushion, seat, etc.) INTERVENTIONS - Wound Cleansing / Measurement X - Simple Wound Cleansing - one wound 1 5 _21  - 0 Complex Wound Cleansing - multiple wounds  X- 1 5 Wound Imaging (photographs - any number of wounds) _0  - 0 Wound Tracing (instead of photographs) X- 1 5 Simple Wound Measurement - one wound _1  - 0 Complex Wound Measurement - multiple wounds INTERVENTIONS - Wound Dressings X - Small Wound Dressing one or multiple wounds 1 10 _2  - 0 Medium Wound Dressing one or multiple wounds _3  - 0 Large Wound Dressing one or multiple wounds X- 1 5 Application of Medications - topical <[id number]>_4<\/QVZDGLOVFIEPPIRJ>_1  - 0 Application of Medications - injection INTERVENTIONS - Miscellaneous _5  - 0 External ear exam _6  - 0 Specimen Collection (cultures, biopsies, blood, body fluids, etc.) _7  - 0 Specimen(s) / Culture(s) sent or taken to Lab for analysis _8  - 0 Patient Transfer (multiple staff / Civil Service fast streamer / Similar devices) _9  - 0 Simple Staple / Suture removal (25 or less) _10  - 0 Complex Staple / Suture removal (26 or more) _11  - 0 Hypo / Hyperglycemic Management (close monitor of Blood Glucose) _12  - 0 Ankle / Brachial Index (ABI) - do not check if billed separately X- 1 5 Vital Signs Has the patient been seen at the hospital within the last three years: Yes Total Score: 90 Level Of Care: New/Established - Level 3 Electronic Signature(s) Signed: 03/05/2020 5:57:23 PM By: [name] Coria RN Entered By: [name] on 03/05/2020 09:48:53 -------------------------------------------------------------------------------- Encounter Discharge Information Details Patient Name: Date of Service: [name] 03/05/2020 8:00 A M Medical Record Number: [id number] Patient Account Number: [id number] Date of Birth/Sex: Treating RN: Nov 02, 1950 (69 y.o. [name] Primary Care Kaedence Connelly: [name] Other Clinician: Referring Laree Garron: Treating Genasis Zingale/Extender: [name]  in Treatment: 6 Encounter Discharge Information Items Discharge Condition: Stable Ambulatory Status: Ambulatory Discharge Destination: Home Transportation: Private Auto Accompanied By: spouse Schedule Follow-up Appointment: Yes Clinical Summary of Care: Patient Declined Electronic Signature(s) Signed: 03/11/2020 4:12:59 PM By: Levan Hurst RN, BSN Entered By: Levan Hurst on 03/05/2020 09:49:33 -------------------------------------------------------------------------------- Lower Extremity Assessment Details Patient Name: Date of Service: [name] 03/05/2020 8:00 A M Medical Record Number: [id number] Patient Account Number: [id number] Date of Birth/Sex: Treating RN: Sep 28, 1950 (69 y.o. [name] Primary Care Audrey Thull: [name] Other Clinician: Referring Gonsalo Cuthbertson: Treating Shalena Ezzell/Extender: [name] in Treatment: 6 Edema Assessment Assessed: [name]: No] [name]: No] Edema: [Left: Ye] [Right: s] Calf Left: Right: Point of Measurement: cm From Medial Instep cm 36.5 cm Ankle Left: Right: Point of Measurement: cm From Medial Instep cm 23.5 cm Vascular Assessment Pulses: Dorsalis Pedis Palpable: [Right:Yes] Electronic Signature(s) Signed: 03/05/2020 5:34:53 PM By: Baruch Gouty RN, BSN Entered By: Baruch Gouty on 03/05/2020 08:30:15 -------------------------------------------------------------------------------- [place] Details Patient Name: Date of Service: [name] 03/05/2020 8:00 A M Medical Record Number: [id number] Patient Account Number: [id number] Date of Birth/Sex: Treating RN: [birth year] (68 y.o. [name]) [name] Primary Care Marquise Lambson: [name] Other Clinician: Referring Wakeelah Solan: Treating Lucciano Vitali/Extender: [name] in Treatment: 6 Active Inactive Wound/Skin Impairment Nursing Diagnoses: Knowledge deficit related to ulceration/compromised skin  integrity Goals: Patient/caregiver will verbalize understanding of skin care regimen Date Initiated: 01/23/2020 Target Resolution Date: 03/24/2020 Goal Status: Active Ulcer/skin breakdown will have a volume reduction of 30% by week 4 Date Initiated: 01/23/2020 Date Inactivated: 03/05/2020 Target Resolution Date: 02/22/2020 Goal Status: Met Ulcer/skin breakdown will have a volume reduction of 50% by week 8 Date Initiated: 03/05/2020 Target Resolution Date: 03/24/2020 Goal Status: Active Interventions: Assess patient/caregiver ability to obtain necessary supplies Assess patient/caregiver ability to perform ulcer/skin care  regimen upon admission and as needed Assess ulceration(s) every visit Notes: Electronic Signature(s) Signed: 03/05/2020 5:57:23 PM By: [name] Coria RN Entered By: [name] on 03/05/2020 08:53:42 -------------------------------------------------------------------------------- Pain Assessment Details Patient Name: Date of Service: SHAHEED, SCHMUCK 03/05/2020 8:00 A M Medical Record Number: [id number] Patient Account Number: [id number] Date of Birth/Sex: Treating RN: 11/12/1950 (69 y.o. [name] Primary Care Ahriana Gunkel: [name] Other Clinician: Referring Meher Kucinski: Treating Alfie Alderfer/Extender: [name] in Treatment: 6 Active Problems Location of Pain Severity and Description of Pain Patient Has Paino No Site Locations Rate the pain. Current Pain Level: 0 Pain Management and Medication Current Pain Management: Electronic Signature(s) Signed: 03/05/2020 5:34:53 PM By: Baruch Gouty RN, BSN Entered By: Baruch Gouty on 03/05/2020 42:59:56 -------------------------------------------------------------------------------- Patient/Caregiver Education Details Patient Name: Date of Service: Lupita Leash 7/13/2021andnbsp8:00 [place] Record Number: [id number] Patient Account Number: [id number] Date of Birth/Gender: Treating  RN: 08/29/1950 (68 y.o. [name] Primary Care Physician: [name] Other Clinician: Referring Physician: Treating Physician/Extender: [name] in Treatment: 6 Education Assessment Education Provided To: Patient Education Topics Provided Wound/Skin Impairment: Methods: Explain/Verbal Responses: State content correctly Electronic Signature(s) Signed: 03/05/2020 5:57:23 PM By: [name] Coria RN Entered By: [name] on 03/05/2020 08:23:58 -------------------------------------------------------------------------------- Wound Assessment Details Patient Name: Date of Service: Lupita Leash 03/05/2020 8:00 A M Medical Record Number: [id number] Patient Account Number: [id number] Date of Birth/Sex: Treating RN: 1950/10/03 (69 y.o. [name] Primary Care Ambera Fedele: [name] Other Clinician: Referring Kile Kabler: Treating Kemon Devincenzi/Extender: [name] in Treatment: 6 Wound Status Wound Number: 1 Primary Diabetic Wound/Ulcer of the Lower Extremity Etiology: Wound Location: Right T Great oe Wound Open Wounding Event: Gradually Appeared Status: Date Acquired: 11/23/2019 Comorbid Cataracts, Coronary Artery Disease, Peripheral Arterial Disease, Weeks Of Treatment: 6 History: Type I Diabetes, Osteomyelitis, Neuropathy Clustered Wound: No Photos Photo Uploaded By: [name] on 03/05/2020 15:39:23 Wound Measurements Length: (cm) 0.5 Width: (cm) 0.4 Depth: (cm) 0.2 Area: (cm) 0.157 Volume: (cm) 0.031 % Reduction in Area: -33.1% % Reduction in Volume: -158.3% Epithelialization: Small (1-33%) Tunneling: No Undermining: No Wound Description Classification: Grade 2 Wound Margin: Flat and Intact Exudate Amount: Small Exudate Type: Serosanguineous Exudate Color: red, brown Foul Odor After Cleansing: No Slough/Fibrino Yes Wound Bed Granulation Amount: Large (67-100%) Exposed Structure Granulation  Quality: Red, Pink Fascia Exposed: No Necrotic Amount: Small (1-33%) Fat Layer (Subcutaneous Tissue) Exposed: Yes Necrotic Quality: Adherent Slough Tendon Exposed: No Muscle Exposed: No Joint Exposed: No Bone Exposed: No Treatment Notes Wound #1 (Right Toe Great) 1. Cleanse With Wound Cleanser 3. Primary Dressing Applied Hydrofera Blue 4. Secondary Dressing Dry Gauze Roll Gauze 5. Secured With Recruitment consultant) Signed: 03/05/2020 5:34:53 PM By: Baruch Gouty RN, BSN Entered By: Baruch Gouty on 03/05/2020 08:31:31 -------------------------------------------------------------------------------- [place] Details Patient Name: Date of Service: [name] 03/05/2020 8:00 A M Medical Record Number: [id number] Patient Account Number: [id number] Date of Birth/Sex: Treating RN: 1951/01/11 (69 y.o. [name] Primary Care Cornelis Kluver: [name] Other Clinician: Referring Dreana Britz: Treating Shyler Holzman/Extender: [name] in Treatment: 6 Vital Signs Time Taken: 08:25 Temperature (F): 98.0 Height (in): 71 Pulse (bpm): 58 Source: Stated Respiratory Rate (breaths/min): 18 Weight (lbs): 152 Blood Pressure (mmHg): 143/61 Source: Stated Reference Range: 80 - 120 mg / dl Body Mass Index (BMI): 21.2 Electronic Signature(s) Signed: 03/05/2020 5:34:53 PM By: Baruch Gouty RN, BSN Entered By: Baruch Gouty on 03/05/2020 08:25:49

## 2020-03-19 ENCOUNTER — Encounter (HOSPITAL_BASED_OUTPATIENT_CLINIC_OR_DEPARTMENT_OTHER): Payer: Medicare Other | Admitting: Internal Medicine

## 2020-03-19 DIAGNOSIS — E10621 Type 1 diabetes mellitus with foot ulcer: Secondary | ICD-10-CM | POA: Diagnosis not present

## 2020-03-20 ENCOUNTER — Ambulatory Visit: Payer: Medicare Other | Admitting: Podiatry

## 2020-03-20 NOTE — Progress Notes (Signed)
[name] ([id number]) Visit Report for 03/19/2020 HPI Details Patient Name: Date of Service: [name], [name] 03/19/2020 8:00 A M Medical Record Number: [id number] Patient Account Number: [id number] Date of Birth/Sex: Treating [name]: Jul 28, 1951 (68 y.o. 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 He is a type I diabetic on an insulin pump diagnosed at age 37. He also has known peripheral neuropathy. He has been dealing with a wound on his right first toe since at least February by review of epic. He was seen by Dr. Lajoyce Corners. Diagnosed with an ischemic wound sent to see Dr. Myra Gianotti him who noted a noncompressible ABI 1.67 and a TBI of 0.25 monophasic waveforms. On 11/15/2019 he underwent a right femoral to below-knee popliteal bypass with an ipsilateral saphenous vein. He also underwent extensive endarterectomy I believe that the external iliac. Postoperative ABI was 1.16 he has been using Neosporin on this and gradually making progress according to his companion. He is offloading this with a hole in the inserts in his work shoes. Per his companion who does the dressings the wound has been getting smaller Past medical history; type 1 diabetes with PAD and PN, history of a left BKA in 2017, coronary artery disease, hypercholesterolemia, hypertension he is a smoker 6/29; this is a patient I have not seen in 4 [name]. He is a type I diabetic on an insulin pump with a prior left BKA. He was revascularized before he came to our clinic with a right femoral to below-knee popliteal bypass with a saphenous vein. He also had an extensive endarterectomy. We used Hydrofera Blue to this wound. 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 Wears  work boots. 7/13; type I diabetic with an area on the plantar tip of his right great toe in the setting of a previous left BKA and extensive endarterectomy with a right femoral to below-knee popliteal bypass before he came to our clinic. We have been using Hydrofera Blue the wound is measuring small. 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 Previous left BKA. He is also had an extensive endarterectomy with a right femoral to below-knee popliteal bypass before he came here. We have been using Hydrofera Blue. Nice improvements in the wound which is small and superficial now. He has an appointment tomorrow [facility]. I think he made this on his own but we have talked about this last week. He has a hammer deformity of the toe. I also wondered if they could be helpful in modifying his foot wear. 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 [name] Primary Care Provider: Creola [name] Other Clinician: Referring Provider: Treating Provider/Extender: [name] in Treatment: 8 Constitutional Patient is hypertensive.. Pulse regular and within target range for patient.Marland Kitchen  Respirations regular, non-labored and within target range.. Temperature is normal and within the target range for the patient.Marland Kitchen. Appears in no distress. Cardiovascular Dorsalis pedis pulse on the right is palpable but certainly not  robust. Notes Wound exam Small wound in the tip of the right great toe. No debridement is required. The wound is superficial no depth. Electronic Signature(s) Signed: 03/20/2020 3:52:22 AM By: [name] Najjarobson, Dajae Kizer MD Entered By: [name] on 03/19/2020 09:17:14 -------------------------------------------------------------------------------- Physician Orders Details Patient Name: Date of Service: [name] BostonGREEN, [name] 03/19/2020 8:00 A M Medical Record Number: [id number] Patient Account Number: [id number] Date of Birth/Sex: Treating [name]: Jun 25, 1951 (68 y.o. [name] PetitM) [name] Primary Care Provider: [name], [name] Other Clinician: Referring Provider: Treating Provider/Extender: [name] Leighobson, Angee Gupton Russo, [name] in Treatment: 8 Verbal / Phone Orders: No Diagnosis Coding ICD-10 Coding Code Description E10.621 Type 1 diabetes mellitus with foot ulcer E10.51 Type 1 diabetes mellitus with diabetic peripheral angiopathy without gangrene E10.42 Type 1 diabetes mellitus with diabetic polyneuropathy L97.512 Non-pressure chronic ulcer of other part of right foot with fat layer exposed Follow-up Appointments Return Appointment in 2 [name]. Dressing Change Frequency Wound #1 Right T Great oe Change dressing every day. Wound Cleansing Wound #1 Right T Great oe Clean wound with Normal Saline. Primary Wound Dressing Wound #1 Right T Great oe Hydrofera Blue Secondary Dressing Wound #1 Right T Great oe Kerlix/Rolled Gauze Dry Gauze Electronic Signature(s) Signed: 03/20/2020 3:52:22 AM By: [name] Najjarobson, Audris Speaker MD Signed: 03/20/2020 6:04:59 PM By: [name] PaxEpps, [name] Entered By: [name] on 03/19/2020 08:00:25 -------------------------------------------------------------------------------- Problem List Details Patient Name: Date of Service: [name] BostonGREEN, [name] 03/19/2020 8:00 A M Medical Record Number: [id number] Patient Account Number: [id number] Date of Birth/Sex: Treating [name]: Jun 25, 1951 (68 y.o. [name] FloridaM)  Epps, [name] Primary Care Provider: [name], [name] Other Clinician: Referring Provider: Treating Provider/Extender: Rennie Natterobson, Omarr Hann Russo, Vivianne SpenceJohn [name] in Treatment: 8 Active Problems ICD-10 Encounter Code Description Active Date MDM Diagnosis E10.621 Type 1 diabetes mellitus with foot ulcer 01/23/2020 No Yes E10.51 Type 1 diabetes mellitus with diabetic peripheral angiopathy without gangrene 01/23/2020 No Yes E10.42 Type 1 diabetes mellitus with diabetic polyneuropathy 01/23/2020 No Yes L97.512 Non-pressure chronic ulcer of other part of right foot with fat layer exposed 01/23/2020 No Yes Inactive Problems Resolved Problems Electronic Signature(s) Signed: 03/20/2020 3:52:22 AM By: [name] Najjarobson, Breaker Springer MD Entered By: [name] Najjarobson, Merced Hanners on 03/19/2020 09:14:01 -------------------------------------------------------------------------------- Progress Note Details Patient Name: Date of Service: [name] BostonGREEN, [name] 03/19/2020 8:00 A M Medical Record Number: [id number] Patient Account Number: [id number] Date of Birth/Sex: Treating [name]: Jun 25, 1951 (68 y.o. 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 He is a type I diabetic on an insulin pump diagnosed at age 38. He also has known peripheral neuropathy. He has been dealing with a wound on his right first toe since at least February by review of epic. He was seen by Dr. Lajoyce Cornersuda. Diagnosed with an ischemic wound sent to see Dr. Myra GianottiBrabham him who noted a noncompressible ABI 1.67 and a TBI of 0.25 monophasic waveforms. On 11/15/2019 he underwent a right femoral to below-knee popliteal bypass with an ipsilateral saphenous vein. He also underwent extensive endarterectomy I believe that the external iliac.  Postoperative ABI was 1.16 he has been using Neosporin on this and gradually making progress according to his companion. He is offloading  this with a hole in the inserts in his work shoes. Per his companion who does the dressings the wound has been getting smaller Past medical history; type 1 diabetes with PAD and PN, history of a left BKA in 2017, coronary artery disease, hypercholesterolemia, hypertension he is a smoker 6/29; this is a patient I have not seen in 4 [name]. He is a type I diabetic on an insulin pump with a prior left BKA. He was revascularized before he came to our clinic with a right femoral to below-knee popliteal bypass with a saphenous vein. He also had an extensive endarterectomy. We used Hydrofera Blue to this wound. 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 Wears work boots. 7/13; type I diabetic with an area on the plantar tip of his right great toe in the setting of a previous left BKA and extensive endarterectomy with a right femoral to below-knee popliteal bypass before he came to our clinic. We have been using Hydrofera Blue the wound is measuring small. 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 Previous left BKA. He is also had an extensive endarterectomy with a right femoral to below-knee popliteal bypass before he came here. We have been using Hydrofera Blue. Nice improvements in the wound which is small and superficial now. He has [facility] appointment tomorrow [facility]. I think he made this on his own but we have talked about this last week. He has a hammer deformity of the toe. I also wondered if they could be helpful in modifying his foot wear. 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 Pulse regular and within target range for patient.Marland Kitchen Respirations  regular, non-labored and within target range.. Temperature is normal and within the target range for the patient.Marland Kitchen Appears in no distress. Vitals Time Taken: 8:15 AM, Height: 71 in, Weight: 152 lbs, BMI: 21.2, Temperature: 98.3 F, Pulse: 60 bpm, Respiratory Rate: 18 breaths/min, Blood Pressure: 144/65 mmHg. Cardiovascular Dorsalis pedis pulse on the right is palpable but certainly not robust. General Notes: Wound exam ooSmall wound in the tip of the right great toe. No debridement is required. The wound is superficial no depth. Integumentary (Hair, Skin) Wound #1 status is Open. Original cause of wound was Gradually Appeared. The wound is located on the Right T Great. The wound measures 0.3cm length x oe 0.5cm width x 0.1cm depth; 0.118cm^2 area and 0.012cm^3 volume. There is Fat Layer (Subcutaneous Tissue) Exposed exposed. There is no tunneling or undermining noted. There is a small amount of serosanguineous drainage noted. The wound margin is flat and intact. There is medium (34-66%) red, pink granulation within the wound bed. There is a medium (34-66%) amount of necrotic tissue within the wound bed including Adherent Slough. Assessment Active Problems ICD-10 Type 1 diabetes mellitus with foot ulcer Type 1 diabetes mellitus with diabetic peripheral angiopathy without gangrene Type 1 diabetes mellitus with diabetic polyneuropathy Non-pressure chronic ulcer of other part of right foot with fat layer exposed Plan Follow-up Appointments: Return Appointment in 2 [name]. Dressing Change Frequency: Wound #1 Right T Great: oe Change dressing every day. Wound Cleansing: Wound #1 Right T Great: oe Clean wound with Normal Saline. Primary Wound Dressing: Wound #1 Right T Great: oe Hydrofera Blue Secondary Dressing:  Wound #1 Right T Great: oe Kerlix/Rolled Gauze Dry Gauze 1. We will continue with Hydrofera Blue 2. He is seeing podiatry tomorrow. I am hopeful they can talk to him about  footwear modifications. Not sure he would be a candidate for surgery to straighten the toe however because of PAD. 3. Quite an improvement in the wound. I be hopeful this would close in a week or 2. He is however at risk of further breakdown given the degree of activity he has and the foot wear issues Electronic Signature(s) Signed: 03/20/2020 3:52:22 AM By: [name] Najjar MD Entered By: [name] on 03/19/2020 09:18:39 -------------------------------------------------------------------------------- SuperBill Details Patient Name: Date of Service: [name] 03/19/2020 Medical Record Number: [id number] Patient Account Number: [id number] Date of Birth/Sex: Treating [name]: 11-29-1950 (68 y.o. [name]) [name] Primary Care Provider: Creola [name] Other Clinician: Referring Provider: Treating Provider/Extender: [name] in Treatment: 8 Diagnosis Coding ICD-10 Codes Code Description [phone] Type 1 diabetes mellitus with foot ulcer E10.51 Type 1 diabetes mellitus with diabetic peripheral angiopathy without gangrene E10.42 Type 1 diabetes mellitus with diabetic polyneuropathy L97.512 Non-pressure chronic ulcer of other part of right foot with fat layer exposed Facility Procedures CPT4 Code: [id number] Description: 40973 - WOUND CARE VISIT-LEV 2 EST PT Modifier: Quantity: 1 Physician Procedures : CPT4 Code Description Modifier [id number] - WC PHYS LEVEL 3 - EST PT ICD-10 Diagnosis Description E10.621 Type 1 diabetes mellitus with foot ulcer L97.512 Non-pressure chronic ulcer of other part of right foot with fat layer exposed E10.51 Type 1  diabetes mellitus with diabetic peripheral angiopathy without gangrene Quantity: 1 Electronic Signature(s) Signed: 03/20/2020 3:52:22 AM By: [name] Najjar MD Entered By: [name] on 03/19/2020 09:19:00

## 2020-03-22 NOTE — Progress Notes (Signed)
[name] ([id number]) Visit Report for 03/19/2020 Arrival Information Details Patient Name: Date of Service: [name] 03/19/2020 8:00 A M Medical Record Number: [id number] Patient Account Number: [id number] Date of Birth/Sex: Treating RN: 11-06-1950 (69 y.o. [name] Primary Care Takuya Lariccia: [name] Other Clinician: Referring Haddie Bruhl: Treating Kajsa Butrum/Extender: [name] in Treatment: 8 Visit Information History Since Last Visit Added or deleted any medications: No Patient Arrived: Ambulatory Any new allergies or adverse reactions: No Arrival Time: 08:14 Had a fall or experienced change in No Accompanied By: self activities of daily living that may affect Transfer Assistance: None risk of falls: Patient Identification Verified: Yes Signs or symptoms of abuse/neglect since last visito No Secondary Verification Process Completed: Yes Hospitalized since last visit: No Patient Requires Transmission-Based Precautions: No Implantable device outside of the clinic excluding No Patient Has Alerts: No cellular tissue based products placed in the center since last visit: Has Dressing in Place as Prescribed: Yes Pain Present Now: No Electronic Signature(s) Signed: 03/22/2020 5:02:39 PM By: Kela Millin Entered By: Kela Millin on 03/19/2020 08:15:20 -------------------------------------------------------------------------------- Clinic Level of Care Assessment Details Patient Name: Date of Service: [name] 03/19/2020 8:00 A M Medical Record Number: [id number] Patient Account Number: [id number] Date of Birth/Sex: Treating RN: 24-Jun-1951 (68 y.o. [name]) [name] Primary Care Senora Lacson: [name] Other Clinician: Referring Athira Janowicz: Treating Abdo Denault/Extender: [name] in Treatment: 8 Clinic Level of Care Assessment Items TOOL 4 Quantity Score X- 1 0 Use when only an EandM is  performed on FOLLOW-UP visit ASSESSMENTS - Nursing Assessment / Reassessment '[]'$  - 0 Reassessment of Co-morbidities (includes updates in patient status) '[]'$  - 0 Reassessment of Adherence to Treatment Plan ASSESSMENTS - Wound and Skin A ssessment / Reassessment X - Simple Wound Assessment / Reassessment - one wound 1 5 '[]'$  - 0 Complex Wound Assessment / Reassessment - multiple wounds '[]'$  - 0 Dermatologic / Skin Assessment (not related to wound area) ASSESSMENTS - Focused Assessment '[]'$  - 0 Circumferential Edema Measurements - multi extremities '[]'$  - 0 Nutritional Assessment / Counseling / Intervention '[]'$  - 0 Lower Extremity Assessment (monofilament, tuning fork, pulses) '[]'$  - 0 Peripheral Arterial Disease Assessment (using hand held doppler) ASSESSMENTS - Ostomy and/or Continence Assessment and Care '[]'$  - 0 Incontinence Assessment and Management '[]'$  - 0 Ostomy Care Assessment and Management (repouching, etc.) PROCESS - Coordination of Care X - Simple Patient / Family Education for ongoing care 1 15 '[]'$  - 0 Complex (extensive) Patient / Family Education for ongoing care X- 1 10 Staff obtains Consents, Records, T Results / Process Orders est '[]'$  - 0 Staff telephones HHA, Nursing Homes / Clarify orders / etc '[]'$  - 0 Routine Transfer to another Facility (non-emergent condition) '[]'$  - 0 Routine Hospital Admission (non-emergent condition) '[]'$  - 0 New Admissions / Biomedical engineer / Ordering NPWT Apligraf, etc. , '[]'$  - 0 Emergency Hospital Admission (emergent condition) X- 1 10 Simple Discharge Coordination '[]'$  - 0 Complex (extensive) Discharge Coordination PROCESS - Special Needs '[]'$  - 0 Pediatric / Minor Patient Management '[]'$  - 0 Isolation Patient Management '[]'$  - 0 Hearing / Language / Visual special needs '[]'$  - 0 Assessment of Community assistance (transportation, D/C planning, etc.) '[]'$  - 0 Additional assistance / Altered mentation '[]'$  - 0 Support Surface(s) Assessment  (bed, cushion, seat, etc.) INTERVENTIONS - Wound Cleansing / Measurement X - Simple Wound Cleansing - one wound 1 5 '[]'$  - 0 Complex Wound Cleansing - multiple wounds X- 1  5 Wound Imaging (photographs - any number of wounds) '[]'$  - 0 Wound Tracing (instead of photographs) X- 1 5 Simple Wound Measurement - one wound '[]'$  - 0 Complex Wound Measurement - multiple wounds INTERVENTIONS - Wound Dressings X - Small Wound Dressing one or multiple wounds 1 10 '[]'$  - 0 Medium Wound Dressing one or multiple wounds '[]'$  - 0 Large Wound Dressing one or multiple wounds X- 1 5 Application of Medications - topical '[]'$  - 0 Application of Medications - injection INTERVENTIONS - Miscellaneous '[]'$  - 0 External ear exam '[]'$  - 0 Specimen Collection (cultures, biopsies, blood, body fluids, etc.) '[]'$  - 0 Specimen(s) / Culture(s) sent or taken to Lab for analysis '[]'$  - 0 Patient Transfer (multiple staff / Civil Service fast streamer / Similar devices) '[]'$  - 0 Simple Staple / Suture removal (25 or less) '[]'$  - 0 Complex Staple / Suture removal (26 or more) '[]'$  - 0 Hypo / Hyperglycemic Management (close monitor of Blood Glucose) '[]'$  - 0 Ankle / Brachial Index (ABI) - do not check if billed separately X- 1 5 Vital Signs Has the patient been seen at the hospital within the last three years: Yes Total Score: 75 Level Of Care: New/Established - Level 2 Electronic Signature(s) Signed: 03/20/2020 6:04:59 PM By: [name] RN Entered By: [name] on 03/19/2020 08:50:52 -------------------------------------------------------------------------------- Encounter Discharge Information Details Patient Name: Date of Service: [name] Leash. 03/19/2020 8:00 A M Medical Record Number: [id number] Patient Account Number: [id number] Date of Birth/Sex: Treating RN: 27-Nov-1950 (69 y.o. [name] Primary Care Puja Caffey: [name] Other Clinician: Referring Miyeko Mahlum: Treating Dietra Stokely/Extender: [name] in Treatment: 8 Encounter Discharge Information Items Discharge Condition: Stable Ambulatory Status: Ambulatory Discharge Destination: Home Transportation: Private Auto Accompanied By: self Schedule Follow-up Appointment: Yes Clinical Summary of Care: Patient Declined Electronic Signature(s) Signed: 03/22/2020 5:02:39 PM By: Kela Millin Entered By: Kela Millin on 03/19/2020 08:57:53 -------------------------------------------------------------------------------- Lower Extremity Assessment Details Patient Name: Date of Service: [name] Leash 03/19/2020 8:00 A M Medical Record Number: [id number] Patient Account Number: [id number] Date of Birth/Sex: Treating RN: [birth year] (69 y.o. [name] Primary Care Calirose Mccance: [name] Other Clinician: Referring Rynn Markiewicz: Treating Sheria Rosello/Extender: [name] in Treatment: 8 Edema Assessment Assessed: Shirlyn Goltz: No] Patrice Paradise: No] Edema: [Left: Ye] [Right: s] Calf Left: Right: Point of Measurement: cm From Medial Instep cm 36 cm Ankle Left: Right: Point of Measurement: cm From Medial Instep cm 23 cm Vascular Assessment Pulses: Dorsalis Pedis Palpable: [Right:Yes] Electronic Signature(s) Signed: 03/22/2020 5:02:39 PM By: Kela Millin Entered By: Kela Millin on 03/19/2020 08:19:28 -------------------------------------------------------------------------------- Multi Wound Chart Details Patient Name: Date of Service: [name] Leash. 03/19/2020 8:00 A M Medical Record Number: [id number] Patient Account Number: [id number] Date of Birth/Sex: Treating RN: 1951/01/20 (68 y.o. [name]) Dolores Lory, Morey Hummingbird Primary Care Malyia Moro: [name] Other Clinician: Referring Zen Felling: Treating Maydelin Deming/Extender: [name] in Treatment: 8 Vital Signs Height(in): 71 Pulse(bpm): 60 Weight(lbs): 152 Blood Pressure(mmHg): 144/65 Body Mass Index(BMI):  21 Temperature(F): 98.3 Respiratory Rate(breaths/min): 18 Photos: [1:No Photos Right T Great oe] [N/A:N/A N/A] Wound Location: [1:Gradually Appeared] [N/A:N/A] Wounding Event: [1:Diabetic Wound/Ulcer of the Lower] [N/A:N/A] Primary Etiology: [1:Extremity Cataracts, Coronary Artery Disease,] [N/A:N/A] Comorbid History: [1:Peripheral Arterial Disease, Type I Diabetes, Osteomyelitis, Neuropathy 11/23/2019] [N/A:N/A] Date Acquired: [1:8] [N/A:N/A] Weeks of Treatment: [1:Open] [N/A:N/A] Wound Status: [1:0.3x0.5x0.1] [N/A:N/A] Measurements L x W x D (cm) [1:0.118] [N/A:N/A] A (cm) : rea [1:0.012] [N/A:N/A] Volume (cm) : [1:0.00%] [N/A:N/A] % Reduction in A rea: [  1:0.00%] [N/A:N/A] % Reduction in Volume: [1:Grade 2] [N/A:N/A] Classification: [1:Small] [N/A:N/A] Exudate A mount: [1:Serosanguineous] [N/A:N/A] Exudate Type: [1:red, brown] [N/A:N/A] Exudate Color: [1:Flat and Intact] [N/A:N/A] Wound Margin: [1:Medium (34-66%)] [N/A:N/A] Granulation A mount: [1:Red, Pink] [N/A:N/A] Granulation Quality: [1:Medium (34-66%)] [N/A:N/A] Necrotic A mount: [1:Fat Layer (Subcutaneous Tissue)] [N/A:N/A] Exposed Structures: [1:Exposed: Yes Fascia: No Tendon: No Muscle: No Joint: No Bone: No Small (1-33%)] [N/A:N/A] Treatment Notes Wound #1 (Right Toe Great) 1. Cleanse With Wound Cleanser 2. Periwound Care Skin Prep 3. Primary Dressing Applied Hydrofera Blue 4. Secondary Dressing Dry Gauze Roll Gauze 5. Secured With Recruitment consultant) Signed: 03/20/2020 3:52:22 AM By: Linton Ham MD Signed: 03/20/2020 6:04:59 PM By: [name] RN Entered By: Linton Ham on 03/19/2020 09:14:07 -------------------------------------------------------------------------------- Multi-Disciplinary Care Plan Details Patient Name: Date of Service: [name] Leash 03/19/2020 8:00 A M Medical Record Number: [id number] Patient Account Number: [id number] Date of Birth/Sex: Treating RN: May 09, 1951  (68 y.o. Oval Linsey Primary Care Ishani Goldwasser: [name] Other Clinician: Referring Ayvin Lipinski: Treating Lesly Pontarelli/Extender: [name] in Treatment: 8 Active Inactive Wound/Skin Impairment Nursing Diagnoses: Knowledge deficit related to ulceration/compromised skin integrity Goals: Patient/caregiver will verbalize understanding of skin care regimen Date Initiated: 01/23/2020 Target Resolution Date: 03/24/2020 Goal Status: Active Ulcer/skin breakdown will have a volume reduction of 30% by week 4 Date Initiated: 01/23/2020 Date Inactivated: 03/05/2020 Target Resolution Date: 02/22/2020 Goal Status: Met Ulcer/skin breakdown will have a volume reduction of 50% by week 8 Date Initiated: 03/05/2020 Target Resolution Date: 03/24/2020 Goal Status: Active Interventions: Assess patient/caregiver ability to obtain necessary supplies Assess patient/caregiver ability to perform ulcer/skin care regimen upon admission and as needed Assess ulceration(s) every visit Notes: Electronic Signature(s) Signed: 03/20/2020 6:04:59 PM By: [name] RN Entered By: [name] on 03/19/2020 08:00:31 -------------------------------------------------------------------------------- Pain Assessment Details Patient Name: Date of Service: SEIJI, WISWELL 03/19/2020 8:00 A M Medical Record Number: [id number] Patient Account Number: [id number] Date of Birth/Sex: Treating RN: 02/03/1951 (69 y.o. [name] Primary Care Takyla Kuchera: [name] Other Clinician: Referring Nicoletta Hush: Treating Teretha Chalupa/Extender: [name] in Treatment: 8 Active Problems Location of Pain Severity and Description of Pain Patient Has Paino No Site Locations Pain Management and Medication Current Pain Management: Electronic Signature(s) Signed: 03/22/2020 5:02:39 PM By: Kela Millin Entered By: Kela Millin on 03/19/2020  08:19:19 -------------------------------------------------------------------------------- Patient/Caregiver Education Details Patient Name: Date of Service: [name] Leash 7/27/2021andnbsp8:00 [place] Record Number: [id number] Patient Account Number: [id number] Date of Birth/Gender: Treating RN: [birth year] (68 y.o. Oval Linsey Primary Care Physician: [name] Other Clinician: Referring Physician: Treating Physician/Extender: [name] in Treatment: 8 Education Assessment Education Provided To: Patient Education Topics Provided Wound/Skin Impairment: Methods: Explain/Verbal Responses: State content correctly Electronic Signature(s) Signed: 03/20/2020 6:04:59 PM By: [name] RN Entered By: [name] on 03/19/2020 08:00:45 -------------------------------------------------------------------------------- Wound Assessment Details Patient Name: Date of Service: LANNIE, YUSUF 03/19/2020 8:00 A M Medical Record Number: [id number] Patient Account Number: [id number] Date of Birth/Sex: Treating RN: 06/23/1951 (69 y.o. [name] Primary Care Nakoma Gotwalt: [name] Other Clinician: Referring Berdell Nevitt: Treating Jimmye Wisnieski/Extender: [name] in Treatment: 8 Wound Status Wound Number: 1 Primary Diabetic Wound/Ulcer of the Lower Extremity Etiology: Wound Location: Right T Great oe Wound Open Wounding Event: Gradually Appeared Status: Date Acquired: 11/23/2019 Comorbid Cataracts, Coronary Artery Disease, Peripheral Arterial Disease, Weeks Of Treatment: 8 History: Type I Diabetes, Osteomyelitis, Neuropathy Clustered Wound: No Photos Photo Uploaded By: Mikeal Hawthorne on 03/20/2020 13:35:11  Wound Measurements Length: (cm) 0.3 % Redu Width: (cm) 0.5 % Redu Depth: (cm) 0.1 Epithe Area: (cm) 0.118 [place] Volume: (cm) 0.012 Under ction in Area: 0% ction in Volume: 0% lialization: Small (1-33%) ling:  No mining: No Wound Description Classification: Grade 2 Foul O Wound Margin: Flat and Intact Slough Exudate Amount: Small Exudate Type: Serosanguineous Exudate Color: red, brown dor After Cleansing: No /Fibrino Yes Wound Bed Granulation Amount: Medium (34-66%) Exposed Structure Granulation Quality: Red, Pink Fascia Exposed: No Necrotic Amount: Medium (34-66%) Fat Layer (Subcutaneous Tissue) Exposed: Yes Necrotic Quality: Adherent Slough Tendon Exposed: No Muscle Exposed: No Joint Exposed: No Bone Exposed: No Treatment Notes Wound #1 (Right Toe Great) 1. Cleanse With Wound Cleanser 2. Periwound Care Skin Prep 3. Primary Dressing Applied Hydrofera Blue 4. Secondary Dressing Dry Gauze Roll Gauze 5. Secured With Recruitment consultant) Signed: 03/22/2020 5:02:39 PM By: Kela Millin Entered By: Kela Millin on 03/19/2020 08:20:51 -------------------------------------------------------------------------------- Vitals Details Patient Name: Date of Service: [name] Leash. 03/19/2020 8:00 A M Medical Record Number: [id number] Patient Account Number: [id number] Date of Birth/Sex: Treating RN: 1951/07/10 (69 y.o. [name] Primary Care Emonnie Cannady: [name] Other Clinician: Referring Nainika Newlun: Treating Alayne Estrella/Extender: [name] in Treatment: 8 Vital Signs Time Taken: 08:15 Temperature (F): 98.3 Height (in): 71 Pulse (bpm): 60 Weight (lbs): 152 Respiratory Rate (breaths/min): 18 Body Mass Index (BMI): 21.2 Blood Pressure (mmHg): 144/65 Reference Range: 80 - 120 mg / dl Electronic Signature(s) Signed: 03/22/2020 5:02:39 PM By: Kela Millin Entered By: Kela Millin on 03/19/2020 08:19:13

## 2020-03-25 ENCOUNTER — Encounter (HOSPITAL_BASED_OUTPATIENT_CLINIC_OR_DEPARTMENT_OTHER): Payer: Medicare Other | Admitting: Internal Medicine

## 2020-04-02 ENCOUNTER — Other Ambulatory Visit: Payer: Self-pay

## 2020-04-02 ENCOUNTER — Encounter (HOSPITAL_BASED_OUTPATIENT_CLINIC_OR_DEPARTMENT_OTHER): Payer: Medicare Other | Attending: Internal Medicine | Admitting: Internal Medicine

## 2020-04-02 DIAGNOSIS — E10621 Type 1 diabetes mellitus with foot ulcer: Secondary | ICD-10-CM | POA: Insufficient documentation

## 2020-04-02 DIAGNOSIS — L97512 Non-pressure chronic ulcer of other part of right foot with fat layer exposed: Secondary | ICD-10-CM | POA: Insufficient documentation

## 2020-04-02 DIAGNOSIS — I10 Essential (primary) hypertension: Secondary | ICD-10-CM | POA: Diagnosis not present

## 2020-04-02 DIAGNOSIS — Z89512 Acquired absence of left leg below knee: Secondary | ICD-10-CM | POA: Insufficient documentation

## 2020-04-02 DIAGNOSIS — I2510 Atherosclerotic heart disease of native coronary artery without angina pectoris: Secondary | ICD-10-CM | POA: Diagnosis not present

## 2020-04-02 DIAGNOSIS — E1042 Type 1 diabetes mellitus with diabetic polyneuropathy: Secondary | ICD-10-CM | POA: Diagnosis not present

## 2020-04-02 DIAGNOSIS — E1051 Type 1 diabetes mellitus with diabetic peripheral angiopathy without gangrene: Secondary | ICD-10-CM | POA: Diagnosis not present

## 2020-04-02 DIAGNOSIS — Z9641 Presence of insulin pump (external) (internal): Secondary | ICD-10-CM | POA: Diagnosis not present

## 2020-04-02 DIAGNOSIS — F17200 Nicotine dependence, unspecified, uncomplicated: Secondary | ICD-10-CM | POA: Insufficient documentation

## 2020-04-02 NOTE — Progress Notes (Signed)
[name] ([id number]) Visit Report for 04/02/2020 Arrival Information Details Patient Name: Date of Service: [name] 04/02/2020 8:00 A M Medical Record Number: [id number] Patient Account Number: [id number] Date of Birth/Sex: Treating RN: 23-Dec-1950 (69 y.o. [name], [name] Primary Care [name]: [name] Other Clinician: Referring [name]: Treating [name]/Extender: [name] in Treatment: 10 Visit Information History Since Last Visit Added or deleted any medications: No Patient Arrived: Ambulatory Any new allergies or adverse reactions: No Arrival Time: 07:59 Had a fall or experienced change in No Accompanied By: spouse activities of daily living that may affect Transfer Assistance: None risk of falls: Patient Identification Verified: Yes Signs or symptoms of abuse/neglect since last visito No Secondary Verification Process Completed: Yes Hospitalized since last visit: No Patient Requires Transmission-Based Precautions: No Implantable device outside of the clinic excluding No Patient Has Alerts: No cellular tissue based products placed in the center since last visit: Has Dressing in Place as Prescribed: Yes Pain Present Now: No Electronic Signature(s) Signed: 04/02/2020 5:26:04 PM By: [name] Entered By: [name] on 04/02/2020 07:59:50 -------------------------------------------------------------------------------- Clinic Level of Care Assessment Details Patient Name: Date of Service: DELFINO, [name] 04/02/2020 8:00 A M Medical Record Number: [id number] Patient Account Number: [id number] Date of Birth/Sex: Treating RN: [birth year] (68 y.o. [name]) [name] Primary Care Aleeza [place]: [name] Other Clinician: Referring [name]: Treating [name]/Extender: [name] in Treatment: 10 Clinic Level of Care Assessment Items TOOL 4 Quantity Score X- 1 0 Use when only an EandM is  performed on FOLLOW-UP visit ASSESSMENTS - Nursing Assessment / Reassessment X- 1 10 Reassessment of Co-morbidities (includes updates in patient status) X- 1 5 Reassessment of Adherence to Treatment Plan ASSESSMENTS - Wound and Skin A ssessment / Reassessment X - Simple Wound Assessment / Reassessment - one wound 1 5 '[]'$  - 0 Complex Wound Assessment / Reassessment - multiple wounds '[]'$  - 0 Dermatologic / Skin Assessment (not related to wound area) ASSESSMENTS - Focused Assessment '[]'$  - 0 Circumferential Edema Measurements - multi extremities '[]'$  - 0 Nutritional Assessment / Counseling / Intervention '[]'$  - 0 Lower Extremity Assessment (monofilament, tuning fork, pulses) '[]'$  - 0 Peripheral Arterial Disease Assessment (using hand held doppler) ASSESSMENTS - Ostomy and/or Continence Assessment and Care '[]'$  - 0 Incontinence Assessment and Management '[]'$  - 0 Ostomy Care Assessment and Management (repouching, etc.) PROCESS - Coordination of Care X - Simple Patient / Family Education for ongoing care 1 15 '[]'$  - 0 Complex (extensive) Patient / Family Education for ongoing care X- 1 10 Staff obtains Consents, Records, T Results / Process Orders est '[]'$  - 0 Staff telephones HHA, Nursing Homes / Clarify orders / etc '[]'$  - 0 Routine Transfer to another Facility (non-emergent condition) '[]'$  - 0 Routine Hospital Admission (non-emergent condition) '[]'$  - 0 New Admissions / Biomedical engineer / Ordering NPWT Apligraf, etc. , '[]'$  - 0 Emergency Hospital Admission (emergent condition) X- 1 10 Simple Discharge Coordination '[]'$  - 0 Complex (extensive) Discharge Coordination PROCESS - Special Needs '[]'$  - 0 Pediatric / Minor Patient Management '[]'$  - 0 Isolation Patient Management '[]'$  - 0 Hearing / Language / Visual special needs '[]'$  - 0 Assessment of Community assistance (transportation, D/C planning, etc.) '[]'$  - 0 Additional assistance / Altered mentation '[]'$  - 0 Support Surface(s) Assessment  (bed, cushion, seat, etc.) INTERVENTIONS - Wound Cleansing / Measurement X - Simple Wound Cleansing - one wound 1 5 '[]'$  - 0 Complex Wound Cleansing - multiple wounds  X- 1 5 Wound Imaging (photographs - any number of wounds) '[]'$  - 0 Wound Tracing (instead of photographs) X- 1 5 Simple Wound Measurement - one wound '[]'$  - 0 Complex Wound Measurement - multiple wounds INTERVENTIONS - Wound Dressings '[]'$  - 0 Small Wound Dressing one or multiple wounds X- 1 15 Medium Wound Dressing one or multiple wounds '[]'$  - 0 Large Wound Dressing one or multiple wounds X- 1 5 Application of Medications - topical '[]'$  - 0 Application of Medications - injection INTERVENTIONS - Miscellaneous '[]'$  - 0 External ear exam '[]'$  - 0 Specimen Collection (cultures, biopsies, blood, body fluids, etc.) '[]'$  - 0 Specimen(s) / Culture(s) sent or taken to Lab for analysis '[]'$  - 0 Patient Transfer (multiple staff / Civil Service fast streamer / Similar devices) '[]'$  - 0 Simple Staple / Suture removal (25 or less) '[]'$  - 0 Complex Staple / Suture removal (26 or more) '[]'$  - 0 Hypo / Hyperglycemic Management (close monitor of Blood Glucose) '[]'$  - 0 Ankle / Brachial Index (ABI) - do not check if billed separately X- 1 5 Vital Signs Has the patient been seen at the hospital within the last three years: Yes Total Score: 95 Level Of Care: New/Established - Level 3 Electronic Signature(s) Signed: 04/02/2020 5:05:11 PM By: [name] Coria RN Entered By: [name] on 04/02/2020 08:30:38 -------------------------------------------------------------------------------- Encounter Discharge Information Details Patient Name: Date of Service: [name]. 04/02/2020 8:00 A M Medical Record Number: [id number] Patient Account Number: [id number] Date of Birth/Sex: Treating RN: 24-Dec-1950 (69 y.o. [name] Primary Care [name]: [name] Other Clinician: Referring [name]: Treating [name]/Extender: [name] in Treatment: 10 Encounter Discharge Information Items Discharge Condition: Stable Ambulatory Status: Ambulatory Discharge Destination: Home Transportation: Private Auto Accompanied By: wife Schedule Follow-up Appointment: Yes Clinical Summary of Care: Patient Declined Electronic Signature(s) Signed: 04/02/2020 5:12:21 PM By: Kela Millin Entered By: Kela Millin on 04/02/2020 08:41:33 -------------------------------------------------------------------------------- Lower Extremity Assessment Details Patient Name: Date of Service: [name] 04/02/2020 8:00 A M Medical Record Number: [id number] Patient Account Number: [id number] Date of Birth/Sex: Treating RN: 07/04/1951 (69 y.o. [name] Primary Care Kalieb Freeland: [name] Other Clinician: Referring Royetta Probus: Treating Matson Welch/Extender: [name] in Treatment: 10 Edema Assessment Assessed: Shirlyn Goltz: No] Patrice Paradise: No] Edema: [Left: Ye] [Right: s] Calf Left: Right: Point of Measurement: cm From Medial Instep cm 35.5 cm Ankle Left: Right: Point of Measurement: cm From Medial Instep cm 22.6 cm Vascular Assessment Pulses: Dorsalis Pedis Palpable: [Right:Yes] Electronic Signature(s) Signed: 04/02/2020 5:26:04 PM By: [name] Entered By: [name] on 04/02/2020 08:03:20 -------------------------------------------------------------------------------- [place] Details Patient Name: Date of Service: [name]. 04/02/2020 8:00 A M Medical Record Number: [id number] Patient Account Number: [id number] Date of Birth/Sex: Treating RN: 1950-11-10 (68 y.o. [name] Primary Care Kyl Givler: [name] Other Clinician: Referring Tyona Nilsen: Treating Aluna Whiston/Extender: [name] in Treatment: 10 Active Inactive Wound/Skin Impairment Nursing Diagnoses: Knowledge deficit related to ulceration/compromised  skin integrity Goals: Patient/caregiver will verbalize understanding of skin care regimen Date Initiated: 01/23/2020 Target Resolution Date: 04/24/2020 Goal Status: Active Ulcer/skin breakdown will have a volume reduction of 30% by week 4 Date Initiated: 01/23/2020 Date Inactivated: 03/05/2020 Target Resolution Date: 02/22/2020 Goal Status: Met Ulcer/skin breakdown will have a volume reduction of 50% by week 8 Date Initiated: 03/05/2020 Date Inactivated: 04/02/2020 Target Resolution Date: 03/24/2020 Goal Status: Unmet Unmet Reason: comorbities Ulcer/skin breakdown will have a volume reduction of 80% by week 12  Date Initiated: 04/02/2020 Target Resolution Date: 04/24/2020 Goal Status: Active Interventions: Assess patient/caregiver ability to obtain necessary supplies Assess patient/caregiver ability to perform ulcer/skin care regimen upon admission and as needed Assess ulceration(s) every visit Notes: Electronic Signature(s) Signed: 04/02/2020 5:05:11 PM By: [name] Coria RN Entered By: [name] on 04/02/2020 08:23:16 -------------------------------------------------------------------------------- Pain Assessment Details Patient Name: Date of Service: FADIL, [place]MASTER 04/02/2020 8:00 A M Medical Record Number: [id number] Patient Account Number: [id number] Date of Birth/Sex: Treating RN: May 24, 1951 (69 y.o. [name] Primary Care Kynadee [place]: [name] Other Clinician: Referring Eathon Valade: Treating Kaulder Zahner/Extender: [name] in Treatment: 10 Active Problems Location of Pain Severity and Description of Pain Patient Has Paino No Site Locations Rate the pain. Current Pain Level: 0 Pain Management and Medication Current Pain Management: Electronic Signature(s) Signed: 04/02/2020 5:26:04 PM By: [name] Entered By: [name] on 04/02/2020  08:00:00 -------------------------------------------------------------------------------- Patient/Caregiver Education Details Patient Name: Date of Service: [name] 8/10/2021andnbsp8:00 [place] Record Number: [id number] Patient Account Number: [id number] Date of Birth/Gender: Treating RN: [birth year] (68 y.o. [name] Primary Care Physician: [name] Other Clinician: Referring Physician: Treating Physician/Extender: [name] in Treatment: 10 Education Assessment Education Provided To: Patient Education Topics Provided Wound/Skin Impairment: Methods: Explain/Verbal Responses: State content correctly Electronic Signature(s) Signed: 04/02/2020 5:05:11 PM By: [name] Coria RN Entered By: [name] on 04/02/2020 08:00:33 -------------------------------------------------------------------------------- Wound Assessment Details Patient Name: Date of Service: [name], PANICO 04/02/2020 8:00 A M Medical Record Number: [id number] Patient Account Number: [id number] Date of Birth/Sex: Treating RN: July 16, 1951 (69 y.o. [name] Primary Care Beatris Belen: [name] Other Clinician: Referring Evann Koelzer: Treating Alvis Edgell/Extender: [name] in Treatment: 10 Wound Status Wound Number: 1 Primary Diabetic Wound/Ulcer of the Lower Extremity Etiology: Wound Location: Right T Great oe Wound Open Wounding Event: Gradually Appeared Status: Date Acquired: 11/23/2019 Comorbid Cataracts, Coronary Artery Disease, Peripheral Arterial Disease, Weeks Of Treatment: 10 History: Type I Diabetes, Osteomyelitis, Neuropathy Clustered Wound: No Wound Measurements Length: (cm) 0.7 Width: (cm) 0.7 Depth: (cm) 0.2 Area: (cm) 0.385 Volume: (cm) 0.077 % Reduction in Area: -226.3% % Reduction in Volume: -541.7% Epithelialization: None Tunneling: No Undermining: No Wound Description Classification: Grade 2 Wound Margin:  Flat and Intact Exudate Amount: Small Exudate Type: Serosanguineous Exudate Color: red, brown Foul Odor After Cleansing: No Slough/Fibrino Yes Wound Bed Granulation Amount: Small (1-33%) Exposed Structure Granulation Quality: Pink Fascia Exposed: No Necrotic Amount: Large (67-100%) Fat Layer (Subcutaneous Tissue) Exposed: Yes Necrotic Quality: Adherent Slough Tendon Exposed: No Muscle Exposed: No Joint Exposed: No Bone Exposed: No Treatment Notes Wound #1 (Right Toe Great) 1. Cleanse With Wound Cleanser Soap and water 2. Periwound Care Barrier cream 3. Primary Dressing Applied Hydrofera Blue 4. Secondary Dressing Dry Gauze Roll Gauze 5. Secured With Tape Notes front offloader given to patient, he said he would try to wear at home but would not be able to walk out with it on. Electronic Signature(s) Signed: 04/02/2020 5:26:04 PM By: [name] Entered By: [name] on 04/02/2020 08:05:29 -------------------------------------------------------------------------------- [place] Details Patient Name: Date of Service: [name]. 04/02/2020 8:00 A M Medical Record Number: [id number] Patient Account Number: [id number] Date of Birth/Sex: Treating RN: 1951-03-28 (69 y.o. [name] Primary Care Siddalee Vander[place]: [name] Other Clinician: Referring Cambridge Deleo: Treating Lyfe Reihl/Extender: [name] in Treatment: 10 Vital Signs Time Taken: 08:08 Temperature (F): 98 Height (in): 71 Pulse (bpm): 61 Source: Stated Respiratory Rate (  breaths/min): 18 Weight (lbs): 152 Blood Pressure (mmHg): 136/69 Source: Stated Capillary Blood Glucose (mg/dl): 240 Body Mass Index (BMI): 21.2 Reference Range: 80 - 120 mg / dl Notes glucose per pt report this am Electronic Signature(s) Signed: 04/02/2020 5:26:04 PM By: [name] Entered By: [name] on 04/02/2020 08:09:05

## 2020-04-02 NOTE — Progress Notes (Signed)
[name], [name] ([id number]) Visit Report for 04/02/2020 HPI Details Patient Name: Date of Service: [name], [name] 04/02/2020 8:00 A M Medical Record Number: [id number] Patient Account Number: [id number] Date of Birth/Sex: Treating RN: Jul 24, 1951 (68 y.o. 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 He is a type I diabetic on an insulin pump diagnosed at age 68. He also has known peripheral neuropathy. He has been dealing with a wound on his right first toe since at least February by review of epic. He was seen by Dr. Lajoyce Corners. Diagnosed with an ischemic wound sent to see Dr. Myra Gianotti him who noted a noncompressible ABI 1.67 and a TBI of 0.25 monophasic waveforms. On 11/15/2019 he underwent a right femoral to below-knee popliteal bypass with an ipsilateral saphenous vein. He also underwent extensive endarterectomy I believe that the external iliac. Postoperative ABI was 1.16 he has been using Neosporin on this and gradually making progress according to his companion. He is offloading this with a hole in the inserts in his work shoes. Per his companion who does the dressings the wound has been getting smaller Past medical history; type 1 diabetes with PAD and PN, history of a left BKA in 2017, coronary artery disease, hypercholesterolemia, hypertension he is a smoker 6/29; this is a patient I have not seen in 4 weeks. He is a type I diabetic on an insulin pump with a prior left BKA. He was revascularized before he came to our clinic with a right femoral to below-knee popliteal bypass with a saphenous vein. He also had an extensive endarterectomy. We used Hydrofera Blue to this wound. 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  Wears work boots. 7/13; type I diabetic with an area on the plantar tip of his right great toe in the setting of a previous left BKA and extensive endarterectomy with a right femoral to below-knee popliteal bypass before he came to our clinic. We have been using Hydrofera Blue the wound is measuring small. 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 Previous left BKA. He is also had an extensive endarterectomy with a right femoral to below-knee popliteal bypass before he came here. We have been using Hydrofera Blue. Nice improvements in the wound which is small and superficial now. [facility] appointment tomorrow [facility]. I think he made this on his own but we have talked about this last week. He has a hammer deformity of the toe. I also wondered if they could be helpful in modifying his foot wear. 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 For the next 3 weeks he is traveling with his wife and therefore will not be outdoors as much on his farm. Electronic Signature(s) Signed: 04/02/2020 9:06:07 AM By: Cassandria Anger MD,  MBA Previous Signature: 04/02/2020 8:27:28 AM Version By: Cassandria Anger MD, MBA Entered By: Cassandria Anger on 04/02/2020 09:06:07 -------------------------------------------------------------------------------- Physical Exam Details Patient Name: Date of Service: [name] 04/02/2020 8:00 A M Medical Record Number: [id number] Patient Account Number: [id number] Date of Birth/Sex: Treating RN: 1950-09-28 (68 y.o. [name]) [name] Primary Care Provider: Creola [name] Other Clinician: Referring Provider: Treating Provider/Extender: Cherene Altes in Treatment: 10 Constitutional alert and oriented x 3. sitting or standing blood pressure is within target range for patient.. supine blood pressure is within target range for patient.. pulse regular and within target range for patient.Marland Kitchen respirations regular, non-labored and within target range for patient.Marland Kitchen temperature within target range for patient.. . . Well- nourished and well-hydrated in no acute distress. Notes Right great toe plantar wound healthy base, minimal callus rim, surrounding skin looks intact. Electronic Signature(s) Signed: 04/02/2020 8:27:54 AM By: Cassandria Anger MD, MBA Entered By: Cassandria Anger on 04/02/2020 08:27:54 -------------------------------------------------------------------------------- Physician Orders Details Patient Name: Date of Service: [name] 04/02/2020 8:00 A M Medical Record Number: [id number] Patient Account Number: [id number] Date of Birth/Sex: Treating RN: 1951-08-21 (68 y.o. [name]) [name] Primary Care Provider: Creola [name] Other Clinician: Referring Provider: Treating Provider/Extender: Cherene Altes in Treatment: 10 Verbal / Phone Orders: No Diagnosis Coding ICD-10 Coding Code Description E10.621 Type 1 diabetes mellitus with foot ulcer E10.51 Type 1 diabetes mellitus with diabetic peripheral angiopathy without gangrene E10.42 Type 1 diabetes mellitus with diabetic polyneuropathy L97.512 Non-pressure chronic ulcer of other part of right foot with fat layer exposed Follow-up Appointments Return Appointment in 2 weeks. Dressing Change Frequency Wound #1 Right T Great oe Change dressing every day. Wound Cleansing Wound #1 Right T Great oe Clean wound with Normal Saline. Primary Wound Dressing Wound #1 Right T Great oe Hydrofera Blue Secondary  Dressing Wound #1 Right T Great oe Kerlix/Rolled Gauze Dry Gauze Off-Loading Other: - front off loader when possible Electronic Signature(s) Signed: 04/02/2020 4:52:06 PM By: Cassandria Anger MD, MBA Signed: 04/02/2020 5:05:11 PM By: [name] Pax RN Entered By: [name] on 04/02/2020 08:26:47 -------------------------------------------------------------------------------- Problem List Details Patient Name: Date of Service: [name]. 04/02/2020 8:00 A M Medical Record Number: [id number] Patient Account Number: [id number] Date of Birth/Sex: Treating RN: 1951-05-10 (68 y.o. [name] Primary Care Provider: Creola [name] Other Clinician: Referring Provider: Treating Provider/Extender: Cherene Altes in Treatment: 10 Active Problems ICD-10 Encounter Code Description Active Date MDM Diagnosis E10.621 Type 1 diabetes mellitus with foot ulcer 01/23/2020 No Yes E10.51 Type 1 diabetes mellitus with diabetic peripheral angiopathy without gangrene 01/23/2020 No Yes E10.42 Type 1 diabetes mellitus with diabetic polyneuropathy 01/23/2020 No Yes L97.512 Non-pressure chronic ulcer of other part of right foot with fat layer exposed 01/23/2020 No Yes Inactive Problems Resolved Problems Electronic Signature(s) Signed: 04/02/2020 4:52:06 PM By: Cassandria Anger MD, MBA Signed: 04/02/2020 5:05:11 PM By: [name] Pax RN Entered By: [name] on 04/02/2020 07:59:41 -------------------------------------------------------------------------------- Progress Note Details Patient Name: Date of Service: [name] 04/02/2020 8:00 A M Medical Record Number: [id number] Patient Account Number: [id number] Date of Birth/Sex: Treating RN: 01-14-1951 (68 y.o. 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 He is a type I diabetic on an insulin pump diagnosed  at age 14. He also has known peripheral neuropathy. He has been dealing with a wound on his right first toe since at least February by review of epic. He was seen by Dr. Lajoyce Corners. Diagnosed with an ischemic wound sent to see Dr. Myra Gianotti him who noted a noncompressible ABI 1.67 and a TBI of 0.25 monophasic waveforms. On 11/15/2019 he underwent a right femoral to below-knee popliteal bypass with an ipsilateral saphenous vein. He also underwent extensive endarterectomy I believe that the external iliac. Postoperative ABI was 1.16 he has been using Neosporin on this and gradually making progress according to his companion. He is offloading this with a hole in the inserts in his work shoes. Per his companion who does the dressings the wound has been getting smaller Past medical history; type 1 diabetes with PAD and PN, history of a left BKA in 2017, coronary artery disease, hypercholesterolemia, hypertension he is a smoker 6/29; this is a patient I have not seen in 4 weeks. He is a type I diabetic on an insulin pump with a prior left BKA. He was revascularized before he came to our clinic with a right femoral to below-knee popliteal bypass with a saphenous vein. He also had an extensive endarterectomy. We used Hydrofera Blue to this wound. 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 Wears work boots. 7/13; type I diabetic with an area on the plantar tip of his right great toe in the setting of a previous left BKA and extensive endarterectomy with a right femoral to below-knee popliteal bypass before he came to our clinic. We have been using Hydrofera Blue the wound is measuring small. 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 Previous left BKA. He is also had an extensive endarterectomy with a right femoral to below-knee popliteal bypass before he came here. We have been using Hydrofera Blue. Nice improvements in the wound which is small and superficial now. He has [facility] appointment tomorrow [facility]. I think he made this on his own but we have talked about this last week. He has a hammer deformity of the toe. I also wondered if they could be helpful in modifying his foot wear. 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 For the next 3 weeks he is traveling with his wife and therefore will not be outdoors as much on his farm. Objective Constitutional alert and oriented x 3. sitting or standing blood pressure is within target range for patient.. supine blood pressure is within target range for patient.. pulse regular and within target range for patient.Marland Kitchen respirations regular, non-labored and within target range for patient.Marland Kitchen temperature within target range for patient.. Well- nourished and well-hydrated in no acute distress. Vitals Time Taken: 8:08 AM, Height: 71 in, Source: Stated, Weight: 152 lbs, Source: Stated, BMI: 21.2, Temperature: 98 F, Pulse: 61 bpm, Respiratory Rate: 18 breaths/min, Blood Pressure: 136/69 mmHg, Capillary Blood Glucose: 240 mg/dl. General Notes: glucose per pt report this am General Notes: Right  great toe plantar wound healthy base, minimal callus rim, surrounding skin looks intact. Integumentary (Hair, Skin) Wound #1 status is Open. Original cause of wound was Gradually Appeared. The wound is located on the Right T Great. The wound measures 0.7cm length  x oe 0.7cm width x 0.2cm depth; 0.385cm^2 area and 0.077cm^3 volume. There is Fat Layer (Subcutaneous Tissue) Exposed exposed. There is no tunneling or undermining noted. There is a small amount of serosanguineous drainage noted. The wound margin is flat and intact. There is small (1-33%) pink granulation within the wound bed. There is a large (67-100%) amount of necrotic tissue within the wound bed including Adherent Slough. Assessment Active Problems ICD-10 Type 1 diabetes mellitus with foot ulcer Type 1 diabetes mellitus with diabetic peripheral angiopathy without gangrene Type 1 diabetes mellitus with diabetic polyneuropathy Non-pressure chronic ulcer of other part of right foot with fat layer exposed Plan Follow-up Appointments: Return Appointment in 2 weeks. Dressing Change Frequency: Wound #1 Right T Great: oe Change dressing every day. Wound Cleansing: Wound #1 Right T Great: oe Clean wound with Normal Saline. Primary Wound Dressing: Wound #1 Right T Great: oe Hydrofera Blue Secondary Dressing: Wound #1 Right T Great: oe Kerlix/Rolled Gauze Dry Gauze Off-Loading: Other: - front off loader when possible -Continue Hydrofera Blue to the right great toe, patient has been offered a [place] off loader to use when he is especially not doing his farm work like over the next 3 weeks -Return in 2 weeks -Diabetes control appears to be adequate according to him Psychologist, prison and probation serviceslectronic Signature(s) Signed: 04/02/2020 9:06:24 AM By: Cassandria AngerMadduri, Maliek Schellhorn MD, MBA Signed: 04/02/2020 9:06:24 AM By: Cassandria AngerMadduri, Breon Rehm MD, MBA Previous Signature: 04/02/2020 8:28:55 AM Version By: Cassandria AngerMadduri, Eleny Cortez MD, MBA Entered By: Cassandria AngerMadduri, Chastidy Ranker on 04/02/2020 09:06:24 -------------------------------------------------------------------------------- SuperBill Details Patient Name: Date of Service: [name] BostonGREEN, Haydyn L. 04/02/2020 Medical Record Number: [id number] Patient Account Number: [id number] Date of Birth/Sex: Treating  RN: 10-12-1950 (68 y.o. [name] PetitM) Jettie PaganEpps, Lyla Sonarrie Primary Care Provider: [name], [name] Other Clinician: Referring Provider: Treating Provider/Extender: Cherene AltesMadduri, Zhara Gieske Russo, [name] in Treatment: 10 Diagnosis Coding ICD-10 Codes Code Description E10.621 Type 1 diabetes mellitus with foot ulcer E10.51 Type 1 diabetes mellitus with diabetic peripheral angiopathy without gangrene E10.42 Type 1 diabetes mellitus with diabetic polyneuropathy L97.512 Non-pressure chronic ulcer of other part of right foot with fat layer exposed Facility Procedures CPT4 Code: [id number] Description: [id number] - WOUND CARE VISIT-LEV 3 EST PT Modifier: Quantity: 1 Physician Procedures : CPT4 Code Description Modifier [id number] - WC PHYS LEVEL 3 - EST PT ICD-10 Diagnosis Description E10.621 Type 1 diabetes mellitus with foot ulcer Quantity: 1 Electronic Signature(s) Signed: 04/02/2020 4:52:06 PM By: Cassandria AngerMadduri, Lenin Kuhnle MD, MBA Signed: 04/02/2020 5:05:11 PM By: [name] PaxEpps, Carrie RN Previous Signature: 04/02/2020 8:29:08 AM Version By: Cassandria AngerMadduri, Rosangela Fehrenbach MD, MBA Entered By: [name] PaxEpps, Carrie on 04/02/2020 08:30:55

## 2020-04-09 ENCOUNTER — Other Ambulatory Visit (HOSPITAL_COMMUNITY)
Admission: RE | Admit: 2020-04-09 | Discharge: 2020-04-09 | Disposition: A | Discharge status: Home / Self Care | Payer: Medicare Other | Source: Other Acute Inpatient Hospital | Attending: Internal Medicine | Admitting: Internal Medicine

## 2020-04-09 ENCOUNTER — Encounter (HOSPITAL_BASED_OUTPATIENT_CLINIC_OR_DEPARTMENT_OTHER): Payer: Medicare Other | Admitting: Internal Medicine

## 2020-04-09 ENCOUNTER — Other Ambulatory Visit: Payer: Self-pay

## 2020-04-09 DIAGNOSIS — E10621 Type 1 diabetes mellitus with foot ulcer: Secondary | ICD-10-CM | POA: Diagnosis not present

## 2020-04-14 LAB — AEROBIC CULTURE W GRAM STAIN (SUPERFICIAL SPECIMEN)

## 2020-04-15 ENCOUNTER — Encounter (HOSPITAL_BASED_OUTPATIENT_CLINIC_OR_DEPARTMENT_OTHER): Payer: Medicare Other | Admitting: Internal Medicine

## 2020-04-15 DIAGNOSIS — E10621 Type 1 diabetes mellitus with foot ulcer: Secondary | ICD-10-CM | POA: Diagnosis not present

## 2020-04-16 NOTE — Progress Notes (Signed)
AMAY, MIJANGOS ([id number]) Visit Report for 04/15/2020 Arrival Information Details Patient Name: Date of Service: [name], [name] 04/15/2020 8:00 A M Medical Record Number: [id number] Patient Account Number: [id number] Date of Birth/Sex: Treating RN: 1950/09/16 (69 y.o. Janyth Contes Primary Care Francheska Villeda: [name] Other Clinician: Referring Garvey Westcott: Treating Hensley Treat/Extender: [name] in Treatment: 11 Visit Information History Since Last Visit Added or deleted any medications: No Patient Arrived: Ambulatory Any new allergies or adverse reactions: No Arrival Time: 08:07 Had a fall or experienced change in No Accompanied By: mother activities of daily living that may affect Transfer Assistance: None risk of falls: Patient Identification Verified: Yes Signs or symptoms of abuse/neglect since last visito No Secondary Verification Process Completed: Yes Hospitalized since last visit: No Patient Requires Transmission-Based Precautions: No Implantable device outside of the clinic excluding No Patient Has Alerts: No cellular tissue based products placed in the center since last visit: Has Dressing in Place as Prescribed: Yes Pain Present Now: No Electronic Signature(s) Signed: 04/15/2020 2:53:33 PM By: Sandre Kitty Entered By: Sandre Kitty on 04/15/2020 08:08:13 -------------------------------------------------------------------------------- Clinic Level of Care Assessment Details Patient Name: Date of Service: [name], [name] 04/15/2020 8:00 A M Medical Record Number: [id number] Patient Account Number: [id number] Date of Birth/Sex: Treating RN: 1951/02/29 (69 y.o. Janyth Contes Primary Care Latrell Reitan: [name] Other Clinician: Referring Chiquitta Matty: Treating Tamma Brigandi/Extender: [name] in Treatment: 11 Clinic Level of Care Assessment Items TOOL 4 Quantity Score X- 1 0 Use when only an EandM is  performed on FOLLOW-UP visit ASSESSMENTS - Nursing Assessment / Reassessment X- 1 10 Reassessment of Co-morbidities (includes updates in patient status) X- 1 5 Reassessment of Adherence to Treatment Plan ASSESSMENTS - Wound and Skin A ssessment / Reassessment X - Simple Wound Assessment / Reassessment - one wound 1 5 []  - 0 Complex Wound Assessment / Reassessment - multiple wounds []  - 0 Dermatologic / Skin Assessment (not related to wound area) ASSESSMENTS - Focused Assessment []  - 0 Circumferential Edema Measurements - multi extremities []  - 0 Nutritional Assessment / Counseling / Intervention X- 1 5 Lower Extremity Assessment (monofilament, tuning fork, pulses) []  - 0 Peripheral Arterial Disease Assessment (using hand held doppler) ASSESSMENTS - Ostomy and/or Continence Assessment and Care []  - 0 Incontinence Assessment and Management []  - 0 Ostomy Care Assessment and Management (repouching, etc.) PROCESS - Coordination of Care X - Simple Patient / Family Education for ongoing care 1 15 []  - 0 Complex (extensive) Patient / Family Education for ongoing care X- 1 10 Staff obtains Consents, Records, T Results / Process Orders est []  - 0 Staff telephones HHA, Nursing Homes / Clarify orders / etc []  - 0 Routine Transfer to another Facility (non-emergent condition) []  - 0 Routine Hospital Admission (non-emergent condition) []  - 0 New Admissions / Biomedical engineer / Ordering NPWT Apligraf, etc. , []  - 0 Emergency Hospital Admission (emergent condition) X- 1 10 Simple Discharge Coordination []  - 0 Complex (extensive) Discharge Coordination PROCESS - Special Needs []  - 0 Pediatric / Minor Patient Management []  - 0 Isolation Patient Management []  - 0 Hearing / Language / Visual special needs []  - 0 Assessment of Community assistance (transportation, D/C planning, etc.) []  - 0 Additional assistance / Altered mentation []  - 0 Support Surface(s) Assessment  (bed, cushion, seat, etc.) INTERVENTIONS - Wound Cleansing / Measurement X - Simple Wound Cleansing - one wound 1 5 []  - 0 Complex Wound Cleansing - multiple wounds X- 1  5 Wound Imaging (photographs - any number of wounds) $RemoveBe'[]'RZDYVKCPe$  - 0 Wound Tracing (instead of photographs) X- 1 5 Simple Wound Measurement - one wound $RemoveB'[]'surOGzxD$  - 0 Complex Wound Measurement - multiple wounds INTERVENTIONS - Wound Dressings X - Small Wound Dressing one or multiple wounds 1 10 $Re'[]'pPb$  - 0 Medium Wound Dressing one or multiple wounds $RemoveBeforeD'[]'UJlsWuHEdXOjRH$  - 0 Large Wound Dressing one or multiple wounds X- 1 5 Application of Medications - topical $RemoveB'[]'UrxOGTLN$  - 0 Application of Medications - injection INTERVENTIONS - Miscellaneous $RemoveBeforeD'[]'ThoRDviFaCQEAT$  - 0 External ear exam $Remove'[]'nyumoGW$  - 0 Specimen Collection (cultures, biopsies, blood, body fluids, etc.) $RemoveBefor'[]'PfQGILczKyAO$  - 0 Specimen(s) / Culture(s) sent or taken to Lab for analysis $RemoveBefo'[]'DYjdcfjvXru$  - 0 Patient Transfer (multiple staff / Civil Service fast streamer / Similar devices) $RemoveBeforeDE'[]'bmEQHnDDwpUaVjW$  - 0 Simple Staple / Suture removal (25 or less) $Remove'[]'xOmThnu$  - 0 Complex Staple / Suture removal (26 or more) $Remove'[]'FyoWagL$  - 0 Hypo / Hyperglycemic Management (close monitor of Blood Glucose) $RemoveBefore'[]'ZXipYNrBBVQsl$  - 0 Ankle / Brachial Index (ABI) - do not check if billed separately X- 1 5 Vital Signs Has the patient been seen at the hospital within the last three years: Yes Total Score: 95 Level Of Care: New/Established - Level 3 Electronic Signature(s) Signed: 04/15/2020 4:21:43 PM By: Levan Hurst RN, BSN Entered By: Levan Hurst on 04/15/2020 08:53:12 -------------------------------------------------------------------------------- Encounter Discharge Information Details Patient Name: Date of Service: [name] Leash. 04/15/2020 8:00 A M Medical Record Number: [id number] Patient Account Number: [id number] Date of Birth/Sex: Treating RN: [birth year] (69 y.o. Ernestene Mention Primary Care Cathryne Mancebo: [name] Other Clinician: Referring Katrinia Straker: Treating Makaiyah Schweiger/Extender: [name] in Treatment: 11 Encounter Discharge Information Items Discharge Condition: Stable Ambulatory Status: Ambulatory Discharge Destination: Home Transportation: Private Auto Accompanied By: spouse Schedule Follow-up Appointment: Yes Clinical Summary of Care: Patient Declined Electronic Signature(s) Signed: 04/15/2020 4:21:44 PM By: Baruch Gouty RN, BSN Entered By: Baruch Gouty on 04/15/2020 09:18:38 -------------------------------------------------------------------------------- Lower Extremity Assessment Details Patient Name: Date of Service: [name] Leash. 04/15/2020 8:00 A M Medical Record Number: [id number] Patient Account Number: [id number] Date of Birth/Sex: Treating RN: April 12, 1951 (69 y.o. Ernestene Mention Primary Care Kevin Space: [name] Other Clinician: Referring Jaeleah Smyser: Treating Brodrick Curran/Extender: [name] in Treatment: 11 Edema Assessment Assessed: Shirlyn Goltz: No] Patrice Paradise: No] Edema: [Left: Ye] [Right: s] Calf Left: Right: Point of Measurement: cm From Medial Instep cm 34 cm Ankle Left: Right: Point of Measurement: cm From Medial Instep cm 23.8 cm Vascular Assessment Pulses: Dorsalis Pedis Palpable: [Right:Yes] Electronic Signature(s) Signed: 04/15/2020 4:21:44 PM By: Baruch Gouty RN, BSN Entered By: Baruch Gouty on 04/15/2020 08:18:26 -------------------------------------------------------------------------------- Multi Wound Chart Details Patient Name: Date of Service: [name] Leash. 04/15/2020 8:00 A M Medical Record Number: [id number] Patient Account Number: [id number] Date of Birth/Sex: Treating RN: 06-Jul-1951 (69 y.o. Janyth Contes Primary Care Shaquoya Cosper: [name] Other Clinician: Referring Rosa Gambale: Treating Rollan Roger/Extender: [name] in Treatment: 11 Vital Signs Height(in): 72 Pulse(bpm): 89 Weight(lbs): 152 Blood Pressure(mmHg): 134/71 Body Mass Index(BMI):  21 Temperature(F): 98.1 Respiratory Rate(breaths/min): 18 Photos: [1:No Photos Right T Great oe] [N/A:N/A N/A] Wound Location: [1:Gradually Appeared] [N/A:N/A] Wounding Event: [1:Diabetic Wound/Ulcer of the Lower] [N/A:N/A] Primary Etiology: [1:Extremity Cataracts, Coronary Artery Disease, N/A] Comorbid History: [1:Peripheral Arterial Disease, Type I Diabetes, Osteomyelitis, Neuropathy 11/23/2019] [N/A:N/A] Date Acquired: [1:11] [N/A:N/A] Weeks of Treatment: [1:Open] [N/A:N/A] Wound Status: [1:2.3x3x0.2] [N/A:N/A] Measurements L x W x D (cm) [1:5.419] [N/A:N/A] A (cm) : rea [1:1.084] [N/A:N/A] Volume (cm) : [1:-4492.40%] [N/A:N/A] %  Reduction in A rea: [1:-8933.30%] [N/A:N/A] % Reduction in Volume: [1:Grade 2] [N/A:N/A] Classification: [1:Large] [N/A:N/A] Exudate A mount: [1:Serosanguineous] [N/A:N/A] Exudate Type: [1:red, brown] [N/A:N/A] Exudate Color: [1:Flat and Intact] [N/A:N/A] Wound Margin: [1:Medium (34-66%)] [N/A:N/A] Granulation A mount: [1:Pink] [N/A:N/A] Granulation Quality: [1:Medium (34-66%)] [N/A:N/A] Necrotic A mount: [1:Fat Layer (Subcutaneous Tissue): Yes N/A] Exposed Structures: [1:Fascia: No Tendon: No Muscle: No Joint: No Bone: No None] [N/A:N/A] Treatment Notes Electronic Signature(s) Signed: 04/15/2020 4:21:43 PM By: Levan Hurst RN, BSN Signed: 04/15/2020 4:33:08 PM By: Linton Ham MD Entered By: Linton Ham on 04/15/2020 08:44:29 -------------------------------------------------------------------------------- Multi-Disciplinary Care Plan Details Patient Name: Date of Service: [name] Leash. 04/15/2020 8:00 A M Medical Record Number: [id number] Patient Account Number: [id number] Date of Birth/Sex: Treating RN: 30-May-1951 (69 y.o. Janyth Contes Primary Care Faustino Luecke: [name] Other Clinician: Referring Vonnie Spagnolo: Treating Gaynelle Pastrana/Extender: [name] in Treatment: 11 Active Inactive Wound/Skin  Impairment Nursing Diagnoses: Knowledge deficit related to ulceration/compromised skin integrity Goals: Patient/caregiver will verbalize understanding of skin care regimen Date Initiated: 01/23/2020 Target Resolution Date: 04/24/2020 Goal Status: Active Ulcer/skin breakdown will have a volume reduction of 30% by week 4 Date Initiated: 01/23/2020 Date Inactivated: 03/05/2020 Target Resolution Date: 02/22/2020 Goal Status: Met Ulcer/skin breakdown will have a volume reduction of 50% by week 8 Date Initiated: 03/05/2020 Date Inactivated: 04/02/2020 Target Resolution Date: 03/24/2020 Goal Status: Unmet Unmet Reason: comorbities Ulcer/skin breakdown will have a volume reduction of 80% by week 12 Date Initiated: 04/02/2020 Target Resolution Date: 04/24/2020 Goal Status: Active Interventions: Assess patient/caregiver ability to obtain necessary supplies Assess patient/caregiver ability to perform ulcer/skin care regimen upon admission and as needed Assess ulceration(s) every visit Notes: Electronic Signature(s) Signed: 04/15/2020 4:21:43 PM By: Levan Hurst RN, BSN Entered By: Levan Hurst on 04/15/2020 08:52:46 -------------------------------------------------------------------------------- Pain Assessment Details Patient Name: Date of Service: [name] Leash 04/15/2020 8:00 A M Medical Record Number: [id number] Patient Account Number: [id number] Date of Birth/Sex: Treating RN: Jan 27, 1951 (69 y.o. Janyth Contes Primary Care Azul Coffie: [name] Other Clinician: Referring Ehtan Delfavero: Treating Demetra Moya/Extender: [name] in Treatment: 11 Active Problems Location of Pain Severity and Description of Pain Patient Has Paino No Site Locations Pain Management and Medication Current Pain Management: Electronic Signature(s) Signed: 04/15/2020 2:53:33 PM By: Sandre Kitty Signed: 04/15/2020 4:21:43 PM By: Levan Hurst RN, BSN Entered By: Sandre Kitty on  04/15/2020 08:09:42 -------------------------------------------------------------------------------- Patient/Caregiver Education Details Patient Name: Date of Service: [name], [name] 8/23/2021andnbsp8:00 [place] Record Number: [id number] Patient Account Number: [id number] Date of Birth/Gender: Treating RN: [birth year] (69 y.o. Janyth Contes Primary Care Physician: [name] Other Clinician: Referring Physician: Treating Physician/Extender: [name] in Treatment: 11 Education Assessment Education Provided To: Patient Education Topics Provided Wound/Skin Impairment: Methods: Explain/Verbal Responses: State content correctly [employer]) Signed: 04/15/2020 4:21:43 PM By: Levan Hurst RN, BSN Entered By: Levan Hurst on 04/15/2020 08:14:31 -------------------------------------------------------------------------------- Wound Assessment Details Patient Name: Date of Service: [name] Leash 04/15/2020 8:00 A M Medical Record Number: [id number] Patient Account Number: [id number] Date of Birth/Sex: Treating RN: October 29, 1950 (69 y.o. Ernestene Mention Primary Care Melinda Pottinger: [name] Other Clinician: Referring Alysiana Ethridge: Treating Anel Creighton/Extender: [name] in Treatment: 11 Wound Status Wound Number: 1 Primary Diabetic Wound/Ulcer of the Lower Extremity Etiology: Wound Location: Right T Great oe Wound Open Wounding Event: Gradually Appeared Status: Date Acquired: 11/23/2019 Comorbid Cataracts, Coronary Artery Disease, Peripheral Arterial Disease, Weeks Of Treatment: 11 History: Type I Diabetes, Osteomyelitis, Neuropathy Clustered Wound: No  Wound Measurements Length: (cm) 2.3 Width: (cm) 3 Depth: (cm) 0.2 Area: (cm) 5.419 Volume: (cm) 1.084 % Reduction in Area: -4492.4% % Reduction in Volume: -8933.3% Epithelialization: None Tunneling: No Undermining: No Wound  Description Classification: Grade 2 Wound Margin: Flat and Intact Exudate Amount: Large Exudate Type: Serosanguineous Exudate Color: red, brown Foul Odor After Cleansing: No Slough/Fibrino Yes Wound Bed Granulation Amount: Medium (34-66%) Exposed Structure Granulation Quality: Pink Fascia Exposed: No Necrotic Amount: Medium (34-66%) Fat Layer (Subcutaneous Tissue) Exposed: Yes Necrotic Quality: Adherent Slough Tendon Exposed: No Muscle Exposed: No Joint Exposed: No Bone Exposed: No Treatment Notes Wound #1 (Right Toe Great) 3. Primary Dressing Applied Calcium Alginate Ag 4. Secondary Dressing Dry Gauze Roll Gauze Foam 5. Secured With Tubular Bandage 7. Footwear/Offloading device applied Felt/Foam Notes netting Electronic Signature(s) Signed: 04/15/2020 4:21:44 PM By: Baruch Gouty RN, BSN Entered By: Baruch Gouty on 04/15/2020 08:19:31 -------------------------------------------------------------------------------- [place] Details Patient Name: Date of Service: [name] Leash. 04/15/2020 8:00 A M Medical Record Number: [id number] Patient Account Number: [id number] Date of Birth/Sex: Treating RN: [birth year] (69 y.o. Janyth Contes Primary Care Artemisia Auvil: [name] Other Clinician: Referring Emmons Toth: Treating Jalise Zawistowski/Extender: [name] in Treatment: 11 Vital Signs Time Taken: 08:09 Temperature (F): 98.1 Height (in): 71 Pulse (bpm): 71 Weight (lbs): 152 Respiratory Rate (breaths/min): 18 Body Mass Index (BMI): 21.2 Blood Pressure (mmHg): 134/71 Reference Range: 80 - 120 mg / dl Electronic Signature(s) Signed: 04/15/2020 2:53:33 PM By: Sandre Kitty Entered By: Sandre Kitty on 04/15/2020 08:09:35

## 2020-04-16 NOTE — Progress Notes (Signed)
[name], [name] ([id number]) Visit Report for 04/15/2020 HPI Details Patient Name: Date of Service: [name], [name] 04/15/2020 8:00 A M Medical Record Number: [id number] Patient Account Number: [id number] Date of Birth/Sex: Treating RN: 13-Jun-1951 (69 y.o. 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 He is a type I diabetic on an insulin pump diagnosed at age 29. He also has known peripheral neuropathy. He has been dealing with a wound on his right first toe since at least February by review of epic. He was seen by [name]. Diagnosed with an ischemic wound sent to see Dr. Myra Gianotti him who noted a noncompressible ABI 1.67 and a TBI of 0.25 monophasic waveforms. On 11/15/2019 he underwent a right femoral to below-knee popliteal bypass with an ipsilateral saphenous vein. He also underwent extensive endarterectomy I believe that the external iliac. Postoperative ABI was 1.16 he has been using Neosporin on this and gradually making progress according to his companion. He is offloading this with a hole in the inserts in his work shoes. Per his companion who does the dressings the wound has been getting smaller Past medical history; type 1 diabetes with PAD and PN, history of a left BKA in 2017, coronary artery disease, hypercholesterolemia, hypertension he is a smoker 6/29; this is a patient I have not seen in 4 [name]. He is a type I diabetic on an insulin pump with a prior left BKA. He was revascularized before he came to our clinic with a right femoral to below-knee popliteal bypass with a saphenous vein. He also had an extensive endarterectomy. We used Hydrofera Blue to this wound. 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  Wears work boots. 7/13; type I diabetic with an area on the plantar tip of his right great toe in the setting of a previous left BKA and extensive endarterectomy with a right femoral to below-knee popliteal bypass before he came to our clinic. We have been using Hydrofera Blue the wound is measuring small. 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 Previous left BKA. He is also had an extensive endarterectomy with a right femoral to below-knee popliteal bypass before he came here. We have been using Hydrofera Blue. Nice improvements in the wound which is small and superficial now. [facility] appointment [facility] I think he made this on his own but we have talked about this last week. He has a hammer deformity of the toe. I also wondered if they could be helpful in modifying his foot wear. 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 For the next 3 [name] he is traveling with his wife and therefore will not be outdoors as much on his farm. 8/17; patient returns to clinic today with a much bigger  wound than what I was used to seeing 3 [name] ago. I have looked through his arterial studies from 7/12. At that point he had noncompressible ABIs at 1.72 biphasic waveforms at the PTA and monophasic waveforms at the dorsalis pedis. Unfortunately they did not do a TBI on the great toe because it was bandaged. The interpretation however suggested waveforms showed adequate perfusion. The patient states that he saw [name] about a month ago, I will see if  I can pull this record. As noted previously the patient is a very active man works with a left below-knee prosthesis. The great toe has a bit of a hammer deformity to it. He is trying to offload this using modified insoles in his shoes i.e. cutting out a part of this to lift the toe off the bottom of his shoe. 8/23; culture grew abundant amount of Enterobacter and a few methicillin sensitive staph aureus. I empirically put him on Levaquin in response to this although we were not able to get a hold of him by phone to let them know that the antibiotic was at his pharmacy therefore he has not started it yet. I encouraged him to start today. The wound is larger, a lot of drainage. There is nothing that probes to bone however we are certainly not making any progress. He is attempting to modify his foot wear. Electronic Signature(s) Signed: 04/15/2020 4:33:08 PM By: [name] Najjarobson, [name] Bazen MD Entered By: [name] on 04/15/2020 08:45:49 -------------------------------------------------------------------------------- Physical Exam Details Patient Name: Date of Service: [name], [name] 04/15/2020 8:00 A M Medical Record Number: [id number] Patient Account Number: [id number] Date of Birth/Sex: Treating RN: [birth year] (69 y.o. [name]) [name] Primary Care Provider: [name], [name] Other Clinician: Referring Provider: Treating Provider/Extender: [name] Leighobson, [name] [name] in Treatment: 11 Constitutional Sitting or standing Blood Pressure is within target range for patient.. Pulse regular and within target range for patient.Marland Kitchen. Respirations regular, non-labored and within target range.. Temperature is normal and within the target range for the patient.Marland Kitchen. Appears in no distress. Cardiovascular Pedal pulses are still palpable on the right but not robust. Notes Wound exam; right great toe plantar aspect. Large wound full-thickness. There is a small area of central necrosis but nothing that really probes  to bone here. No debridement. There is swelling of the entire toe. No involvement of the inner phalangeal joint. His pedal pulses are still palpable Electronic Signature(s) Signed: 04/15/2020 4:33:08 PM By: [name] Najjarobson, Sesar Madewell MD Entered By: [name] Najjarobson, Lacrystal Barbe on 04/15/2020 08:46:56 -------------------------------------------------------------------------------- Physician Orders Details Patient Name: Date of Service: [name], [name] 04/15/2020 8:00 A M Medical Record Number: [id number] Patient Account Number: [id number] Date of Birth/Sex: Treating RN: [birth year] (69 y.o. [name]) [name] Primary Care Provider: [name], [name] Other Clinician: Referring Provider: Treating Provider/Extender: [name] Leighobson, Rayah Fines Russo, [name] in Treatment: 11 Verbal / Phone Orders: No Diagnosis Coding ICD-10 Coding Code Description E10.621 Type 1 diabetes mellitus with foot ulcer E10.51 Type 1 diabetes mellitus with diabetic peripheral angiopathy without gangrene E10.42 Type 1 diabetes mellitus with diabetic polyneuropathy L97.512 Non-pressure chronic ulcer of other part of right foot with fat layer exposed Follow-up Appointments Return Appointment in 1 week. Dressing Change Frequency Wound #1 Right T Great oe Change dressing every day. Wound Cleansing Wound #1 Right T Great oe May shower and wash wound with soap and water. Primary Wound Dressing Wound #1 Right T Great oe Calcium Alginate with Silver Secondary Dressing Wound #1 Right T Great oe Foam - foam donut or felt callous pad Kerlix/Rolled  Gauze Dry Gauze Radiology X-ray, right great toe - Non healing ulcer on great toe - (ICD10 E10.621 - Type 1 diabetes mellitus with foot ulcer) Electronic Signature(s) Signed: 04/15/2020 4:21:43 PM By: Zandra Abts RN, BSN Signed: 04/15/2020 4:33:08 PM By: [name] Najjar MD Entered By: Zandra Abts on 04/15/2020 78:29:56 Prescription  04/15/2020 -------------------------------------------------------------------------------- Arville Care MD Patient Name: Provider: [birth year] [id number] Date of Birth: NPI#: [name] [id number] Sex: DEA #: [phone] Phone #: License #: Eligha Bridegroom [facility] Patient Address: [address] [place], [place] [address] [address] [place], [place] [address] [phone] Allergies No Known Allergies Provider's Orders X-ray, right great toe - ICD10: E10.621 - Non healing ulcer on great toe Hand Signature: Date(s): Electronic Signature(s) Signed: 04/15/2020 4:21:43 PM By: Zandra Abts RN, BSN Signed: 04/15/2020 4:33:08 PM By: [name] Najjar MD Entered By: Zandra Abts on 04/15/2020 08:29:23 -------------------------------------------------------------------------------- Problem List Details Patient Name: Date of Service: [name]. 04/15/2020 8:00 A M Medical Record Number: [id number] Patient Account Number: [id number] Date of Birth/Sex: Treating RN: October 25, 1950 (69 y.o. [name] Primary Care Provider: Creola [name] Other Clinician: Referring Provider: Treating Provider/Extender: [name] in Treatment: 11 Active Problems ICD-10 Encounter Code Description Active Date MDM Diagnosis E10.621 Type 1 diabetes mellitus with foot ulcer 01/23/2020 No Yes E10.51 Type 1 diabetes mellitus with diabetic peripheral angiopathy without gangrene 01/23/2020 No Yes E10.42 Type 1 diabetes mellitus with diabetic polyneuropathy 01/23/2020 No Yes L97.512 Non-pressure chronic ulcer of other part of right foot with fat layer exposed 01/23/2020 No Yes Inactive Problems Resolved Problems Electronic Signature(s) Signed: 04/15/2020 4:33:08 PM By: [name] Najjar MD Entered By: [name] Najjar on 04/15/2020  08:44:21 -------------------------------------------------------------------------------- Progress Note Details Patient Name: Date of Service: [name] 04/15/2020 8:00 A M Medical Record Number: [id number] Patient Account Number: [id number] Date of Birth/Sex: Treating RN: 03/04/1951 (69 y.o. 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 He is a type I diabetic on an insulin pump diagnosed at age 31. He also has known peripheral neuropathy. He has been dealing with a wound on his right first toe since at least February by review of epic. He was seen by [name]. Diagnosed with an ischemic wound sent to see Dr. Myra Gianotti him who noted a noncompressible ABI 1.67 and a TBI of 0.25 monophasic waveforms. On 11/15/2019 he underwent a right femoral to below-knee popliteal bypass with an ipsilateral saphenous vein. He also underwent extensive endarterectomy I believe that the external iliac. Postoperative ABI was 1.16 he has been using Neosporin on this and gradually making progress according to his companion. He is offloading this with a hole in the inserts in his work shoes. Per his companion who does the dressings the wound has been getting smaller Past medical history; type 1 diabetes with PAD and PN, history of a left BKA in 2017, coronary artery disease, hypercholesterolemia, hypertension he is a smoker 6/29; this is a patient I have not seen in 4 [name]. He is a type I diabetic on an insulin pump with a prior left BKA. He was revascularized before he came to our clinic with a right femoral to below-knee popliteal bypass with a saphenous vein. He also had an extensive endarterectomy. We used  Hydrofera Blue to this wound.  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 Wears work boots. 7/13; type I diabetic with an area on the plantar tip of his right great toe in the setting of a previous left BKA and extensive endarterectomy with a right femoral to below-knee popliteal bypass before he came to our clinic. We have been using Hydrofera Blue the wound is measuring small. 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 Previous left BKA. He is also had an extensive endarterectomy with a right femoral to below-knee popliteal bypass before he came here. We have been using Hydrofera Blue. Nice improvements in the wound which is small and superficial now. [facility]. I think he made this on his own but we have talked about this last week. He has a hammer deformity of the toe. I also wondered if they could be helpful in modifying his foot wear. 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 For the next 3 [name] he is traveling with his wife and therefore will not be outdoors as much on his farm. 8/17; patient returns to clinic today with a much bigger wound than what I was used to seeing 3 [name] ago. I have looked through his arterial studies from 7/12. At that point he had noncompressible ABIs at 1.72 biphasic waveforms at the PTA and monophasic waveforms at the dorsalis pedis. Unfortunately they did not do a TBI on the great toe because it was bandaged. The interpretation however suggested waveforms showed adequate perfusion. The patient states that he saw Dr.  Myra Gianotti about a month ago, I will see if I can pull this record. As noted previously the patient is a very active man works with a left below-knee prosthesis. The great toe has a bit of a hammer deformity to it. He is trying to offload this using modified insoles in his shoes i.e. cutting out a part of this to lift the toe off the bottom of his shoe. 8/23; culture grew abundant amount of Enterobacter and a few methicillin sensitive staph aureus. I empirically put him on Levaquin in response to this although we were not able to get a hold of him by phone to let them know that the antibiotic was at his pharmacy therefore he has not started it yet. I encouraged him to start today. The wound is larger, a lot of drainage. There is nothing that probes to bone however we are certainly not making any progress. He is attempting to modify his foot wear. Objective Constitutional Sitting or standing Blood Pressure is within target range for patient.. Pulse regular and within target range for patient.Marland Kitchen Respirations regular, non-labored and within target range.. Temperature is normal and within the target range for the patient.Marland Kitchen Appears in no distress. Vitals Time Taken: 8:09 AM, Height: 71 in, Weight: 152 lbs, BMI: 21.2, Temperature: 98.1 F, Pulse: 71 bpm, Respiratory Rate: 18 breaths/min, Blood Pressure: 134/71 mmHg. Cardiovascular Pedal pulses are  still palpable on the right but not robust. General Notes: Wound exam; right great toe plantar aspect. Large wound full-thickness. There is a small area of central necrosis but nothing that really probes to bone here. No debridement. There is swelling of the entire toe. No involvement of the inner phalangeal joint. His pedal pulses are still palpable Integumentary (Hair, Skin) Wound #1 status is Open. Original cause of wound was Gradually Appeared. The wound is located on the Right T Great. The wound measures 2.3cm length x oe 3cm width x 0.2cm depth; 5.419cm^2  area and 1.084cm^3 volume. There is Fat Layer (Subcutaneous Tissue) exposed. There is no tunneling or undermining noted. There is a large amount of serosanguineous drainage noted. The wound margin is flat and intact. There is medium (34-66%) pink granulation within the wound bed. There is a medium (34-66%) amount of necrotic tissue within the wound bed including Adherent Slough. Assessment Active Problems ICD-10 Type 1 diabetes mellitus with foot ulcer Type 1 diabetes mellitus with diabetic peripheral angiopathy without gangrene Type 1 diabetes mellitus with diabetic polyneuropathy Non-pressure chronic ulcer of other part of right foot with fat layer exposed Plan Follow-up Appointments: Return Appointment in 1 week. Dressing Change Frequency: Wound #1 Right T Great: oe Change dressing every day. Wound Cleansing: Wound #1 Right T Great: oe May shower and wash wound with soap and water. Primary Wound Dressing: Wound #1 Right T Great: oe Calcium Alginate with Silver Secondary Dressing: Wound #1 Right T Great: oe Foam - foam donut or felt callous pad Kerlix/Rolled Gauze Dry Gauze Radiology ordered were: X-ray, right great toe - Non healing ulcer on great toe #1 I have encouraged him to pick up the antibiotic today. Unfortunately he did not get started on this as of yet 2. Plain x-ray of the great toe, cannot rule out getting an MRI here. 3. We know he is not offloading this adequately still doing full days work on the farm. I do not know if there is anything else I can shift vascular due to help with this 4. Still using silver alginate 5. I cannot feel the patient's pulse however will need to follow-up on his revascular status. Vascular surgery notes etc. Electronic Signature(s) Signed: 04/15/2020 4:33:08 PM By: [name] Najjar MD Entered By: [name] Najjar on 04/15/2020 08:52:16 -------------------------------------------------------------------------------- SuperBill  Details Patient Name: Date of Service: [name] 04/15/2020 Medical Record Number: [id number] Patient Account Number: [id number] Date of Birth/Sex: Treating RN: 02-Apr-1951 (69 y.o. [name] Primary Care Provider: Creola [name] Other Clinician: Referring Provider: Treating Provider/Extender: [name] in Treatment: 11 Diagnosis Coding ICD-10 Codes Code Description [phone] Type 1 diabetes mellitus with foot ulcer E10.51 Type 1 diabetes mellitus with diabetic peripheral angiopathy without gangrene E10.42 Type 1 diabetes mellitus with diabetic polyneuropathy L97.512 Non-pressure chronic ulcer of other part of right foot with fat layer exposed Facility Procedures CPT4 Code: [id number] Description: 99213 - WOUND CARE VISIT-LEV 3 EST PT Modifier: Quantity: 1 Physician Procedures : CPT4 Code Description Modifier [id number] 99213 - WC PHYS LEVEL 3 - EST PT ICD-10 Diagnosis Description E10.621 Type 1 diabetes mellitus with foot ulcer L97.512 Non-pressure chronic ulcer of other part of right foot with fat layer exposed E10.51 Type 1  diabetes mellitus with diabetic peripheral angiopathy without gangrene Quantity: 1 Electronic Signature(s) Signed: 04/15/2020 4:21:43 PM By: Zandra Abts RN, BSN Signed: 04/15/2020 4:33:08 PM By: [name] Najjar MD Entered By: Zandra Abts on 04/15/2020 08:53:21

## 2020-04-18 ENCOUNTER — Other Ambulatory Visit: Payer: Self-pay

## 2020-04-18 ENCOUNTER — Other Ambulatory Visit (HOSPITAL_COMMUNITY): Payer: Self-pay | Admitting: Internal Medicine

## 2020-04-18 ENCOUNTER — Ambulatory Visit (HOSPITAL_COMMUNITY)
Admission: RE | Admit: 2020-04-18 | Discharge: 2020-04-18 | Disposition: A | Discharge status: Home / Self Care | Payer: Medicare Other | Source: Ambulatory Visit | Attending: Internal Medicine | Admitting: Internal Medicine

## 2020-04-18 DIAGNOSIS — L98491 Non-pressure chronic ulcer of skin of other sites limited to breakdown of skin: Secondary | ICD-10-CM | POA: Insufficient documentation

## 2020-04-20 NOTE — Progress Notes (Signed)
[name] ([id number]) Visit Report for 04/09/2020 Debridement Details Patient Name: Date of Service: [name], [name] 04/09/2020 8:00 A M Medical Record Number: [id number] Patient Account Number: [id number] Date of Birth/Sex: Treating RN: 09/03/1950 (68 y.o. [name]) Yevonne Pax Primary Care Provider: [name] Other Clinician: Referring Provider: Treating Provider/Extender: Lacretia Leigh in Treatment: 11 Debridement Performed for Assessment: Wound #1 Right T Great oe Performed By: Physician Maxwell Caul., MD Debridement Type: Debridement Severity of Tissue Pre Debridement: Fat layer exposed Level of Consciousness (Pre-procedure): Awake and Alert Pre-procedure Verification/Time Out Yes - 08:30 Taken: Start Time: 08:30 Pain Control: Lidocaine 5% topical ointment T Area Debrided (L x W): otal 1 (cm) x 1.1 (cm) = 1.1 (cm) Tissue and other material debrided: Viable, Non-Viable, Slough, Subcutaneous, Skin: Dermis , Skin: Epidermis, Slough Level: Skin/Subcutaneous Tissue Debridement Description: Excisional Instrument: Curette Bleeding: Moderate Hemostasis Achieved: Pressure End Time: 08:34 Procedural Pain: 0 Post Procedural Pain: 0 Response to Treatment: Procedure was tolerated well Level of Consciousness (Post- Awake and Alert procedure): Post Debridement Measurements of Total Wound Length: (cm) 1.7 Width: (cm) 2.2 Depth: (cm) 0.2 Volume: (cm) 0.587 Character of Wound/Ulcer Post Debridement: Improved Severity of Tissue Post Debridement: Fat layer exposed Post Procedure Diagnosis Same as Pre-procedure Electronic Signature(s) Signed: 04/09/2020 6:57:29 PM By: Baltazar Najjar MD Signed: 04/19/2020 5:50:16 PM By: Yevonne Pax RN Entered By: Baltazar Najjar on 04/09/2020 09:22:23 -------------------------------------------------------------------------------- HPI Details Patient Name: Date of Service: [name]. 04/09/2020 8:00 A  M Medical Record Number: [id number] Patient Account Number: [id number] Date of Birth/Sex: Treating RN: August 14, 1951 (68 y.o. 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 He is a type I diabetic on an insulin pump diagnosed at age 51. He also has known peripheral neuropathy. He has been dealing with a wound on his right first toe since at least February by review of epic. He was seen by Dr. Lajoyce Corners. Diagnosed with an ischemic wound sent to see Dr. Myra Gianotti him who noted a noncompressible ABI 1.67 and a TBI of 0.25 monophasic waveforms. On 11/15/2019 he underwent a right femoral to below-knee popliteal bypass with an ipsilateral saphenous vein. He also underwent extensive endarterectomy I believe that the external iliac. Postoperative ABI was 1.16 he has been using Neosporin on this and gradually making progress according to his companion. He is offloading this with a hole in the inserts in his work shoes. Per his companion who does the dressings the wound has been getting smaller Past medical history; type 1 diabetes with PAD and PN, history of a left BKA in 2017, coronary artery disease, hypercholesterolemia, hypertension he is a smoker 6/29; this is a patient I have not seen in 4 weeks. He is a type I diabetic on an insulin pump with a prior left BKA. He was revascularized before he came to our clinic with a right femoral to below-knee popliteal bypass with a saphenous vein. He also had an extensive endarterectomy. We used Hydrofera Blue to this wound. 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 Wears work boots. 7/13; type I diabetic with an area on the plantar tip of his right great toe in the setting of a previous left BKA and  extensive endarterectomy with a right femoral to below-knee popliteal bypass before  he came to our clinic. We have been using Hydrofera Blue the wound is measuring small. 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 Previous left BKA. He is also had an extensive endarterectomy with a right femoral to below-knee popliteal bypass before he came here. We have been using Hydrofera Blue. Nice improvements in the wound which is small and superficial now. [facility] appointment tomorrow [facility] I think he made this on his own but we have talked about this last week. He has a hammer deformity of the toe. I also wondered if they could be helpful in modifying his foot wear. 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 For the next 3 weeks he is traveling with his wife and therefore will not be outdoors as much on his farm. 8/17; patient returns to clinic today with a much bigger wound than what I was used to seeing 3 weeks ago. I have looked through his arterial studies from 7/12. At that point he had noncompressible ABIs at 1.72 biphasic waveforms at the PTA and monophasic waveforms at the dorsalis pedis. Unfortunately they did not do a TBI on the great toe because it was bandaged. The interpretation however suggested waveforms showed adequate perfusion. The patient states that he saw Dr. Myra Gianotti about a month ago, I will see if I can pull this record. As noted previously the patient is a very active man works with a left below-knee prosthesis. The great toe has a  bit of a hammer deformity to it. He is trying to offload this using modified insoles in his shoes i.e. cutting out a part of this to lift the toe off the bottom of his shoe. Electronic Signature(s) Signed: 04/09/2020 6:57:29 PM By: Baltazar Najjar MD Entered By: Baltazar Najjar on 04/09/2020 08:40:10 -------------------------------------------------------------------------------- Physical Exam Details Patient Name: Date of Service: [name] 04/09/2020 8:00 A M Medical Record Number: [id number] Patient Account Number: [id number] Date of Birth/Sex: Treating RN: [birth year] (68 y.o. [name] Primary Care Provider: [name] Other Clinician: Referring Provider: Treating Provider/Extender: Lacretia Leigh in Treatment: 11 Constitutional Sitting or standing Blood Pressure is within target range for patient.. Pulse regular and within target range for patient.Marland Kitchen Respirations regular, non-labored and within target range.. Temperature is normal and within the target range for the patient.Marland Kitchen Appears in no distress. Notes Wound exam; right great toe plantar aspect. The wound is much more substantial by many factors than the last time I saw this. He arrives with denuded none viable skin and subcutaneous tissue around the margins of the toe wound on the right with a nonviable circumference. This does not probe to bone. I used a #15 scalpel and pickups to remove the skin and subsubcutaneous tissue. Overall the wound surface does not look terrible however it is certainly not close to healing. He has a hammer deformity at the inner phalangeal joint no doubt complicates the offloading. Electronic Signature(s) Signed: 04/09/2020 6:57:29 PM By: Baltazar Najjar  MD Entered By: Baltazar Najjar on 04/09/2020 08:41:31 -------------------------------------------------------------------------------- Physician Orders Details Patient Name: Date of Service: PEPE, MINEAU 04/09/2020  8:00 A M Medical Record Number: [id number] Patient Account Number: [id number] Date of Birth/Sex: Treating RN: [birth year] (68 y.o. [name]) Yevonne Pax Primary Care Provider: [name] Other Clinician: Referring Provider: Treating Provider/Extender: Lacretia Leigh in Treatment: 11 Verbal / Phone Orders: No Diagnosis Coding ICD-10 Coding Code Description E10.621 Type 1 diabetes mellitus with foot ulcer E10.51 Type 1 diabetes mellitus with diabetic peripheral angiopathy without gangrene E10.42 Type 1 diabetes mellitus with diabetic polyneuropathy L97.512 Non-pressure chronic ulcer of other part of right foot with fat layer exposed Follow-up Appointments Return Appointment in 1 week. Dressing Change Frequency Wound #1 Right T Great oe Change dressing every day. Wound Cleansing Wound #1 Right T Great oe Clean wound with Normal Saline. Primary Wound Dressing Wound #1 Right T Great oe Calcium Alginate with Silver Secondary Dressing Wound #1 Right T Great oe Kerlix/Rolled Gauze Dry Gauze Laboratory naerobe culture (MICRO) - non healing wound right great toe - (ICD10 E10.621 - Type 1 diabetes Bacteria identified in Unspecified specimen by A mellitus with foot ulcer) LOINC Code: 635-3 Convenience Name: Anerobic culture Electronic Signature(s) Signed: 04/09/2020 5:06:17 PM By: Cherylin Mylar Signed: 04/09/2020 6:57:29 PM By: Baltazar Najjar MD Entered By: Cherylin Mylar on 04/09/2020 08:48:34 Prescription 04/09/2020 -------------------------------------------------------------------------------- Arville Care MD Patient Name: Provider: 1950-10-20 [id number] Date of Birth: NPI#[name] [id number] Sex: DEA #: [phone] [id number] Phone #: License #: Eligha Bridegroom [facility] Patient Address[address] [place], [place] [address] [address] [place], [place]  [address] [phone] Allergies No Known Allergies Provider's Orders naerobe culture - ICD10: E10.621 - non healing wound right great toe Bacteria identified in Unspecified specimen by A LOINC Code: 635-3 Convenience Name: Anerobic culture Hand Signature: Date(s): Electronic Signature(s) Signed: 04/09/2020 5:06:17 PM By: Cherylin Mylar Signed: 04/09/2020 6:57:29 PM By: Baltazar Najjar MD Entered By: Cherylin Mylar on 04/09/2020 08:48:36 -------------------------------------------------------------------------------- Problem List Details Patient Name: Date of Service: [name]. 04/09/2020 8:00 A M Medical Record Number: [id number] Patient Account Number: [id number] Date of Birth/Sex: Treating RN: April 23, 1951 (68 y.o. [name] Primary Care Provider: [name] Other Clinician: Referring Provider: Treating Provider/Extender: Lacretia Leigh in Treatment: 11 Active Problems ICD-10 Encounter Code Description Active Date MDM Diagnosis E10.621 Type 1 diabetes mellitus with foot ulcer 01/23/2020 No Yes E10.51 Type 1 diabetes mellitus with diabetic peripheral angiopathy without gangrene 01/23/2020 No Yes E10.42 Type 1 diabetes mellitus with diabetic polyneuropathy 01/23/2020 No Yes L97.512 Non-pressure chronic ulcer of other part of right foot with fat layer exposed 01/23/2020 No Yes Inactive Problems Resolved Problems Electronic Signature(s) Signed: 04/09/2020 6:57:29 PM By: Baltazar Najjar MD Entered By: Baltazar Najjar on 04/09/2020 08:37:36 -------------------------------------------------------------------------------- Progress Note Details Patient Name: Date of Service: [name] 04/09/2020 8:00 A M Medical Record Number: [id number] Patient Account Number: [id number] Date of Birth/Sex: Treating RN: Jun 01, 1951 (68 y.o. 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 He is a type I diabetic on an insulin pump diagnosed at age 70. He also has known peripheral neuropathy. He has been dealing with a wound on his right first toe since at  least February by review of epic. He was seen by Dr. Lajoyce Cornersuda. Diagnosed with an ischemic wound sent to see Dr. Myra GianottiBrabham him who noted a noncompressible ABI 1.67 and a TBI of 0.25 monophasic waveforms. On 11/15/2019 he underwent a right femoral to below-knee popliteal bypass with an ipsilateral saphenous vein. He also underwent extensive endarterectomy I believe that the external iliac. Postoperative ABI was 1.16 he has been using Neosporin on this and gradually making progress according to his companion. He is offloading this with a hole in the inserts in his work shoes. Per his companion who does the dressings the wound has been getting smaller Past medical history; type 1 diabetes with PAD and PN, history of a left BKA in 2017, coronary artery disease, hypercholesterolemia, hypertension he is a smoker 6/29; this is a patient I have not seen in 4 weeks. He is a type I diabetic on an insulin pump with a prior left BKA. He was revascularized before he came to our clinic with a right femoral to below-knee popliteal bypass with a saphenous vein. He also had an extensive endarterectomy. We used Hydrofera Blue to this wound. 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 Wears work boots. 7/13; type I diabetic with an area on the plantar tip of his right great toe in the setting of a previous left BKA and extensive endarterectomy with a right femoral to below-knee popliteal bypass before he came to our clinic. We have been using Hydrofera Blue the wound is measuring small. 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 Previous left BKA. He is also had an extensive endarterectomy with a right femoral to below-knee popliteal bypass before he came here. We have been using Hydrofera Blue. Nice improvements in the wound which is small and superficial now. He [facility] I think he made this on his own but we have talked about this last week. He has a hammer deformity of the toe. I also wondered if they could be helpful in modifying his foot wear. 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 For the next 3 weeks he is traveling with his wife and therefore will not be outdoors as much on his farm. 8/17; patient returns to clinic today with a much bigger wound than what I was used to seeing 3 weeks ago. I have looked through his arterial studies from 7/12. At that point he had noncompressible ABIs at 1.72 biphasic waveforms at the PTA and monophasic waveforms at the dorsalis pedis. Unfortunately they did not do a TBI on the great toe because it was bandaged. The interpretation however suggested waveforms showed adequate perfusion. The patient states that he saw Dr. Myra GianottiBrabham about a month ago, I will see if I can pull this record. As noted previously the patient is a very active man works with a left below-knee prosthesis. The great toe has a bit of a hammer deformity to it. He is trying to offload  this using modified insoles in his shoes i.e. cutting out a part of this to lift the toe off the bottom of his shoe. Objective Constitutional Sitting or standing Blood Pressure is  within target range for patient.. Pulse regular and within target range for patient.Marland Kitchen Respirations regular, non-labored and within target range.. Temperature is normal and within the target range for the patient.Marland Kitchen Appears in no distress. Vitals Time Taken: 8:02 AM, Height: 71 in, Weight: 152 lbs, BMI: 21.2, Temperature: 97.7 F, Pulse: 74 bpm, Respiratory Rate: 18 breaths/min, Blood Pressure: 137/56 mmHg. General Notes: Wound exam; right great toe plantar aspect. The wound is much more substantial by many factors than the last time I saw this. He arrives with denuded none viable skin and subcutaneous tissue around the margins of the toe wound on the right with a nonviable circumference. This does not probe to bone. I used a #15 scalpel and pickups to remove the skin and subsubcutaneous tissue. Overall the wound surface does not look terrible however it is certainly not close to healing. He has a hammer deformity at the inner phalangeal joint no doubt complicates the offloading. Integumentary (Hair, Skin) Wound #1 status is Open. Original cause of wound was Gradually Appeared. The wound is located on the Right T Great. The wound measures 1cm length x oe 1.1cm width x 0.2cm depth; 0.864cm^2 area and 0.173cm^3 volume. There is Fat Layer (Subcutaneous Tissue) Exposed exposed. There is no tunneling noted, however, there is undermining starting at 12:00 and ending at 12:00 with a maximum distance of 2cm. There is a large amount of serosanguineous drainage noted. The wound margin is flat and intact. There is medium (34-66%) pink granulation within the wound bed. There is a medium (34-66%) amount of necrotic tissue within the wound bed including Adherent Slough. General Notes: periwound mascerated Assessment Active Problems ICD-10 Type 1 diabetes mellitus with foot ulcer Type 1 diabetes mellitus with diabetic peripheral angiopathy without gangrene Type 1 diabetes mellitus with diabetic  polyneuropathy Non-pressure chronic ulcer of other part of right foot with fat layer exposed Procedures Wound #1 Pre-procedure diagnosis of Wound #1 is a Diabetic Wound/Ulcer of the Lower Extremity located on the Right T Great .Severity of Tissue Pre Debridement is: oe Fat layer exposed. There was a Excisional Skin/Subcutaneous Tissue Debridement with a total area of 1.1 sq cm performed by Maxwell Caul., MD. With the following instrument(s): Curette to remove Viable and Non-Viable tissue/material. Material removed includes Subcutaneous Tissue, Slough, Skin: Dermis, and Skin: Epidermis after achieving pain control using Lidocaine 5% topical ointment. No specimens were taken. A time out was conducted at 08:30, prior to the start of the procedure. A Moderate amount of bleeding was controlled with Pressure. The procedure was tolerated well with a pain level of 0 throughout and a pain level of 0 following the procedure. Post Debridement Measurements: 1.7cm length x 2.2cm width x 0.2cm depth; 0.587cm^3 volume. Character of Wound/Ulcer Post Debridement is improved. Severity of Tissue Post Debridement is: Fat layer exposed. Post procedure Diagnosis Wound #1: Same as Pre-Procedure Plan Follow-up Appointments: Return Appointment in 1 week. Dressing Change Frequency: Wound #1 Right T Great: oe Change dressing every day. Wound Cleansing: Wound #1 Right T Great: oe Clean wound with Normal Saline. Primary Wound Dressing: Wound #1 Right T Great: oe Calcium Alginate with Silver Secondary Dressing: Wound #1 Right T Great: oe Kerlix/Rolled Gauze Dry Gauze Laboratory ordered were: Anerobic culture - non healing wound right great toe #1 I switch the wound dressing  back to silver alginate from [facility]. I did this for antibacterial properties also absorption 2. I looked over his last arterial studies, unfortunately they did not do TBI's on him. Nevertheless the report was adequate perfusion  after an extensive revascularization that was done before he came to this clinic. His dorsalis pedis and posterior tibial pulses are palpable although not robust. I do not think that an adequate perfusion is the issue here although it would have been nice to have a TBI/toe pressures to verify that. 3. Post debridement today I did a culture for CandS but no empiric antibiotics. If this cultures he will probably need topical as well as systemic antibiotic therapy. 4. We spent some time of about offloading this. We did not give him a surgical shoe or a forefoot off loader at 1 point but he could not walk across the room let alone work on his farm. I have suggesting modifying his insoles or adding an additional insole cut out to offload the hammertoe. This may mean he will need a shoe with more robust forefoot depth and we have gone over this in some detail with the patient and his wife. Ideally a total contact cast but this would not allow him to work on the farm and so far this is been nonnegotiable with the patient I spent 30 minutes in review of this patient's past medical records, face-to-face evaluation and preparation of this record Electronic Signature(s) Signed: 04/11/2020 5:36:57 PM By: Zandra Abts RN, BSN Signed: 04/11/2020 5:57:44 PM By: Baltazar Najjar MD Previous Signature: 04/09/2020 6:57:29 PM Version By: Baltazar Najjar MD Entered By: Zandra Abts on 04/10/2020 09:41:21 -------------------------------------------------------------------------------- SuperBill Details Patient Name: Date of Service: [name] 04/09/2020 Medical Record Number: [id number] Patient Account Number: [id number] Date of Birth/Sex: Treating RN: [birth year] (68 y.o. [name]) Jettie Pagan, [place] Primary Care Provider: [name] Other Clinician: Referring Provider: Treating Provider/Extender: Lacretia Leigh in Treatment: 11 Diagnosis Coding ICD-10 Codes Code Description E10.621 Type 1  diabetes mellitus with foot ulcer E10.51 Type 1 diabetes mellitus with diabetic peripheral angiopathy without gangrene E10.42 Type 1 diabetes mellitus with diabetic polyneuropathy L97.512 Non-pressure chronic ulcer of other part of right foot with fat layer exposed Facility Procedures CPT4 Code: [id number] Description: 11042 - DEB SUBQ TISSUE 20 SQ CM/< ICD-10 Diagnosis Description E10.621 Type 1 diabetes mellitus with foot ulcer L97.512 Non-pressure chronic ulcer of other part of right foot with fat layer exposed Modifier: Quantity: 1 Physician Procedures : CPT4 Code Description Modifier [id number] 11042 - WC PHYS SUBQ TISS 20 SQ CM ICD-10 Diagnosis Description E10.621 Type 1 diabetes mellitus with foot ulcer L97.512 Non-pressure chronic ulcer of other part of right foot with fat layer exposed Quantity: 1 Electronic Signature(s) Signed: 04/09/2020 6:57:29 PM By: Baltazar Najjar MD Entered By: Baltazar Najjar on 04/09/2020 09:22:47

## 2020-04-20 NOTE — Progress Notes (Signed)
[name], [name] ([id number]) Visit Report for 04/09/2020 Arrival Information Details Patient Name: Date of Service: [name], [name] 04/09/2020 8:00 A M Medical Record Number: [id number] Patient Account Number: [id number] Date of Birth/Sex: Treating RN: [birth year] (68 y.o. [name]) Dolores Lory, [name] Primary Care [name]: [name] Other Clinician: Referring Janal Haak: Treating [name]/Extender: [name] in Treatment: 11 Visit Information History Since Last Visit Added or deleted any medications: No Patient Arrived: Ambulatory Any new allergies or adverse reactions: No Arrival Time: 08:01 Had a fall or experienced change in No Accompanied By: wife activities of daily living that may affect Transfer Assistance: None risk of falls: Patient Identification Verified: Yes Signs or symptoms of abuse/neglect since last visito No Secondary Verification Process Completed: Yes Hospitalized since last visit: No Patient Requires Transmission-Based Precautions: No Implantable device outside of the clinic excluding No Patient Has Alerts: No cellular tissue based products placed in the center since last visit: Has Dressing in Place as Prescribed: Yes Pain Present Now: No Electronic Signature(s) Signed: 04/11/2020 1:30:39 PM By: [name] Entered By: [name] on 04/09/2020 08:02:52 -------------------------------------------------------------------------------- Encounter Discharge Information Details Patient Name: Date of Service: [name]. 04/09/2020 8:00 A M Medical Record Number: [id number] Patient Account Number: [id number] Date of Birth/Sex: Treating RN: 27-May-1951 (69 y.o. [name] Primary Care Wilbur [place]: [name] Other Clinician: Referring Daric Koren: Treating Reign Dziuba/Extender: [name] in Treatment: 11 Encounter Discharge Information Items Discharge Condition: Stable Ambulatory Status:  Ambulatory Discharge Destination: Home Transportation: Private Auto Accompanied By: wife Schedule Follow-up Appointment: Yes Clinical Summary of Care: Patient Declined Electronic Signature(s) Signed: 04/09/2020 5:06:17 PM By: [name] Entered By: [name] on 04/09/2020 08:49:36 -------------------------------------------------------------------------------- Lower Extremity Assessment Details Patient Name: Date of Service: [name] 04/09/2020 8:00 A M Medical Record Number: [id number] Patient Account Number: [id number] Date of Birth/Sex: Treating RN: November 29, 1950 (68 y.o. [name]) [name] Primary Care Roxene Alviar: [name] Other Clinician: Referring Keyante Durio: Treating [name]/Extender: [name] in Treatment: 11 Edema Assessment Assessed: [name]: No] [name]: No] Edema: [Left: Ye] [Right: s] Calf Left: Right: Point of Measurement: cm From Medial Instep cm 36 cm Ankle Left: Right: Point of Measurement: cm From Medial Instep cm 24 cm Electronic Signature(s) Signed: 04/19/2020 5:50:16 PM By: [name] Coria RN Entered By: [name] on 04/09/2020 08:17:34 -------------------------------------------------------------------------------- Multi Wound Chart Details Patient Name: Date of Service: [name]. 04/09/2020 8:00 A M Medical Record Number: [id number] Patient Account Number: [id number] Date of Birth/Sex: Treating RN: 1951-05-17 (68 y.o. [name]) [name] Primary Care Maryn Freelove: [name] Other Clinician: Referring [name]: Treating [name]/Extender: [name] in Treatment: 11 Vital Signs Height(in): 68 Pulse(bpm): 40 Weight(lbs): 152 Blood Pressure(mmHg): 137/56 Body Mass Index(BMI): 21 Temperature(F): 97.7 Respiratory Rate(breaths/min): 18 Photos: [1:No Photos Right T Great oe] [N/A:N/A N/A] Wound Location: [1:Gradually Appeared] [N/A:N/A] Wounding Event: [1:Diabetic Wound/Ulcer of  the Lower] [N/A:N/A] Primary Etiology: [1:Extremity Cataracts, Coronary Artery Disease,] [N/A:N/A] Comorbid History: [1:Peripheral Arterial Disease, Type I Diabetes, Osteomyelitis, Neuropathy 11/23/2019] [N/A:N/A] Date Acquired: [1:11] [N/A:N/A] Weeks of Treatment: [1:Open] [N/A:N/A] Wound Status: [1:1x1.1x0.2] [N/A:N/A] Measurements L x W x D (cm) [1:0.864] [N/A:N/A] A (cm) : rea [1:0.173] [N/A:N/A] Volume (cm) : [1:-632.20%] [N/A:N/A] % Reduction in Area: [1:-1341.70%] [N/A:N/A] % Reduction in Volume: [1:12] Starting Position 1 (o'clock): [1:12] Ending Position 1 (o'clock): [1:2] Maximum Distance 1 (cm): [1:Yes] [N/A:N/A] Undermining: [1:Grade 2] [N/A:N/A] Classification: [1:Large] [N/A:N/A] Exudate A mount: [1:Serosanguineous] [N/A:N/A] Exudate Type: [1:red, brown] [N/A:N/A] Exudate Color: [1:Flat and Intact] [N/A:N/A]  Wound Margin: [1:Medium (34-66%)] [N/A:N/A] Granulation A mount: [1:Pink] [N/A:N/A] Granulation Quality: [1:Medium (34-66%)] [N/A:N/A] Necrotic A mount: [1:Fat Layer (Subcutaneous Tissue): Yes N/A] Exposed Structures: [1:Fascia: No Tendon: No Muscle: No Joint: No Bone: No None] [N/A:N/A] Epithelialization: [1:periwound mascerated] [N/A:N/A] Treatment Notes Electronic Signature(s) Signed: 04/09/2020 6:57:29 PM By: [name] Ham MD Signed: 04/19/2020 5:50:16 PM By: [name] Coria RN Entered By: [name] on 04/09/2020 08:37:42 -------------------------------------------------------------------------------- Multi-Disciplinary Care Plan Details Patient Name: Date of Service: [name]. 04/09/2020 8:00 A M Medical Record Number: [id number] Patient Account Number: [id number] Date of Birth/Sex: Treating RN: 16-Sep-1950 (68 y.o. [name] Primary Care Lashonta Pilling: [name] Other Clinician: Referring Berneice Zettlemoyer: Treating [name]: [name] in Treatment: 11 Active Inactive Wound/Skin Impairment Nursing  Diagnoses: Knowledge deficit related to ulceration/compromised skin integrity Goals: Patient/caregiver will verbalize understanding of skin care regimen Date Initiated: 01/23/2020 Target Resolution Date: 04/24/2020 Goal Status: Active Ulcer/skin breakdown will have a volume reduction of 30% by week 4 Date Initiated: 01/23/2020 Date Inactivated: 03/05/2020 Target Resolution Date: 02/22/2020 Goal Status: Met Ulcer/skin breakdown will have a volume reduction of 50% by week 8 Date Initiated: 03/05/2020 Date Inactivated: 04/02/2020 Target Resolution Date: 03/24/2020 Goal Status: Unmet Unmet Reason: comorbities Ulcer/skin breakdown will have a volume reduction of 80% by week 12 Date Initiated: 04/02/2020 Target Resolution Date: 04/24/2020 Goal Status: Active Interventions: Assess patient/caregiver ability to obtain necessary supplies Assess patient/caregiver ability to perform ulcer/skin care regimen upon admission and as needed Assess ulceration(s) every visit Notes: Electronic Signature(s) Signed: 04/19/2020 5:50:16 PM By: [name] Coria RN Entered By: [name] on 04/09/2020 08:02:48 -------------------------------------------------------------------------------- Pain Assessment Details Patient Name: Date of Service: [name] 04/09/2020 8:00 A M Medical Record Number: [id number] Patient Account Number: [id number] Date of Birth/Sex: Treating RN: 02/15/1951 (68 y.o. [name]) [name] Primary Care Lorianne Malbrough: [name] Other Clinician: Referring Cortney Mckinney: Treating Gladyes Kudo/Extender: [name] in Treatment: 11 Active Problems Location of Pain Severity and Description of Pain Patient Has Paino No Site Locations Pain Management and Medication Current Pain Management: Electronic Signature(s) Signed: 04/11/2020 1:30:39 PM By: [name] Signed: 04/19/2020 5:50:16 PM By: [name] Coria RN Entered By: [name] on 04/09/2020  08:03:15 -------------------------------------------------------------------------------- Patient/Caregiver Education Details Patient Name: Date of Service: [name] 8/17/2021andnbsp8:00 A M Medical Record Number: [id number] Patient Account Number: [id number] Date of Birth/Gender: Treating RN: February 17, 1951 (68 y.o. [name] Primary Care Physician: [name] Other Clinician: Referring Physician: Treating Physician/Extender: [name] in Treatment: 11 Education Assessment Education Provided To: Patient Education Topics Provided Wound/Skin Impairment: Methods: Explain/Verbal Responses: State content correctly Electronic Signature(s) Signed: 04/19/2020 5:50:16 PM By: [name] Coria RN Entered By: [name] on 04/09/2020 08:03:02 -------------------------------------------------------------------------------- Wound Assessment Details Patient Name: Date of Service: [name] 04/09/2020 8:00 A M Medical Record Number: [id number] Patient Account Number: [id number] Date of Birth/Sex: Treating RN: 1950-10-09 (68 y.o. [name]) [name] Primary Care Arhum Peeples: [name] Other Clinician: Referring Ursala Cressy: Treating Janith Nielson/Extender: [name] in Treatment: 11 Wound Status Wound Number: 1 Primary Diabetic Wound/Ulcer of the Lower Extremity Etiology: Wound Location: Right T Great oe Wound Open Wounding Event: Gradually Appeared Status: Date Acquired: 11/23/2019 Comorbid Cataracts, Coronary Artery Disease, Peripheral Arterial Disease, Weeks Of Treatment: 11 History: Type I Diabetes, Osteomyelitis, Neuropathy Clustered Wound: No Photos Photo Uploaded By: Mikeal Hawthorne on 04/10/2020 11:55:54 Wound Measurements Length: (cm) 1 Width: (cm) 1.1 Depth: (cm) 0.2 Area: (cm) 0.864 Volume: (cm) 0.173 % Reduction in Area: -632.2% %  Reduction in Volume: -1341.7% Epithelialization: None Tunneling:  No Undermining: Yes Starting Position (o'clock): 12 Ending Position (o'clock): 12 Maximum Distance: (cm) 2 Wound Description Classification: Grade 2 Wound Margin: Flat and Intact Exudate Amount: Large Exudate Type: Serosanguineous Exudate Color: red, brown Foul Odor After Cleansing: No Slough/Fibrino Yes Wound Bed Granulation Amount: Medium (34-66%) Exposed Structure Granulation Quality: Pink Fascia Exposed: No Necrotic Amount: Medium (34-66%) Fat Layer (Subcutaneous Tissue) Exposed: Yes Necrotic Quality: Adherent Slough Tendon Exposed: No Muscle Exposed: No Joint Exposed: No Bone Exposed: No Assessment Notes periwound mascerated Electronic Signature(s) Signed: 04/19/2020 5:50:16 PM By: [name] Coria RN Entered By: [name] on 04/09/2020 08:19:26 -------------------------------------------------------------------------------- Vitals Details Patient Name: Date of Service: [name]. 04/09/2020 8:00 A M Medical Record Number: [id number] Patient Account Number: [id number] Date of Birth/Sex: Treating RN: 1951-01-31 (68 y.o. [name]) [name] Primary Care Reeder Brisby: [name] Other Clinician: Referring Clotiel Troop: Treating Kirstan Fentress/Extender: [name] in Treatment: 11 Vital Signs Time Taken: 08:02 Temperature (F): 97.7 Height (in): 71 Pulse (bpm): 74 Weight (lbs): 152 Respiratory Rate (breaths/min): 18 Body Mass Index (BMI): 21.2 Blood Pressure (mmHg): 137/56 Reference Range: 80 - 120 mg / dl Electronic Signature(s) Signed: 04/11/2020 1:30:39 PM By: [name] Entered By: [name] on 04/09/2020 08:03:10

## 2020-04-22 ENCOUNTER — Encounter (HOSPITAL_BASED_OUTPATIENT_CLINIC_OR_DEPARTMENT_OTHER): Payer: Medicare Other | Admitting: Internal Medicine

## 2020-04-22 DIAGNOSIS — E10621 Type 1 diabetes mellitus with foot ulcer: Secondary | ICD-10-CM | POA: Diagnosis not present

## 2020-04-22 NOTE — Progress Notes (Signed)
[name], [name] ([id number]) Visit Report for 04/22/2020 Debridement Details Patient Name: Date of Service: [name], [name] 04/22/2020 8:00 A M Medical Record Number: [id number] Patient Account Number: [id number] Date of Birth/Sex: Treating RN: 29-Apr-1951 (69 y.o. [name] Primary Care Provider: [name] Other Clinician: Referring Provider: Treating Provider/Extender: Lacretia Leigh in Treatment: 12 Debridement Performed for Assessment: Wound #1 Right T Great oe Performed By: Physician Maxwell Caul., MD Debridement Type: Debridement Severity of Tissue Pre Debridement: Fat layer exposed Level of Consciousness (Pre-procedure): Awake and Alert Pre-procedure Verification/Time Out Yes - 08:33 Taken: Start Time: 08:33 T Area Debrided (L x W): otal 1.9 (cm) x 2 (cm) = 3.8 (cm) Tissue and other material debrided: Viable, Non-Viable, Eschar, Slough, Subcutaneous, Slough Level: Skin/Subcutaneous Tissue Debridement Description: Excisional Instrument: Curette Bleeding: Minimum Hemostasis Achieved: Pressure End Time: 08:34 Procedural Pain: 0 Post Procedural Pain: 0 Response to Treatment: Procedure was tolerated well Level of Consciousness (Post- Awake and Alert procedure): Post Debridement Measurements of Total Wound Length: (cm) 1.9 Width: (cm) 2 Depth: (cm) 0.1 Volume: (cm) 0.298 Character of Wound/Ulcer Post Debridement: Improved Severity of Tissue Post Debridement: Fat layer exposed Post Procedure Diagnosis Same as Pre-procedure Electronic Signature(s) Signed: 04/22/2020 4:22:50 PM By: Baltazar Najjar MD Signed: 04/22/2020 4:24:12 PM By: Zandra Abts RN, BSN Entered By: Baltazar Najjar on 04/22/2020 08:58:44 -------------------------------------------------------------------------------- HPI Details Patient Name: Date of Service: [name]. 04/22/2020 8:00 A M Medical Record Number: [id number] Patient Account Number:  [id number] Date of Birth/Sex: Treating RN: [birth year] (69 y.o. 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 He is a type I diabetic on an insulin pump diagnosed at age 51. He also has known peripheral neuropathy. He has been dealing with a wound on his right first toe since at least February by review of epic. He was seen by Dr. Lajoyce Corners. Diagnosed with an ischemic wound sent to see Dr. Myra Gianotti him who noted a noncompressible ABI 1.67 and a TBI of 0.25 monophasic waveforms. On 11/15/2019 he underwent a right femoral to below-knee popliteal bypass with an ipsilateral saphenous vein. He also underwent extensive endarterectomy I believe that the external iliac. Postoperative ABI was 1.16 he has been using Neosporin on this and gradually making progress according to his companion. He is offloading this with a hole in the inserts in his work shoes. Per his companion who does the dressings the wound has been getting smaller Past medical history; type 1 diabetes with PAD and PN, history of a left BKA in 2017, coronary artery disease, hypercholesterolemia, hypertension he is a smoker 6/29; this is a patient I have not seen in 4 weeks. He is a type I diabetic on an insulin pump with a prior left BKA. He was revascularized before he came to our clinic with a right femoral to below-knee popliteal bypass with a saphenous vein. He also had an extensive endarterectomy. We used Hydrofera Blue to this wound. 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 Wears work boots. 7/13; type I diabetic with an area on the plantar tip of his right great toe in the setting of a previous left BKA and extensive endarterectomy with a right femoral to below-knee  popliteal bypass before he came to our clinic. We have been using  Hydrofera Blue the wound is measuring small. 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 Previous left BKA. He is also had an extensive endarterectomy with a right femoral to below-knee popliteal bypass before he came here. We have been using Hydrofera Blue. Nice improvements in the wound which is small and superficial now. He [facility] appointment tomorrow [facility] I think he made this on his own but we have talked about this last week. He has a hammer deformity of the toe. I also wondered if they could be helpful in modifying his foot wear. 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 For the next 3 weeks he is traveling with his wife and therefore will not be outdoors as much on his farm. 8/17; patient returns to clinic today with a much bigger wound than what I was used to seeing 3 weeks ago. I have looked through his arterial studies from 7/12. At that point he had noncompressible ABIs at 1.72 biphasic waveforms at the PTA and monophasic waveforms at the dorsalis pedis. Unfortunately they did not do a TBI on the great toe because it was bandaged. The interpretation however suggested waveforms showed adequate perfusion. The patient states that he saw Dr. Myra GianottiBrabham about a month ago, I will see if I can pull this record. As noted previously the patient is a very active man works with a left below-knee prosthesis. The great toe has a bit of a hammer deformity to it. He is trying to offload  this using modified insoles in his shoes i.e. cutting out a part of this to lift the toe off the bottom of his shoe. 8/23; culture grew abundant amount of Enterobacter and a few methicillin sensitive staph aureus. I empirically put him on Levaquin in response to this although we were not able to get a hold of him by phone to let them know that the antibiotic was at his pharmacy therefore he has not started it yet. I encouraged him to start today. The wound is larger, a lot of drainage. There is nothing that probes to bone however we are certainly not making any progress. He is attempting to modify his foot wear. 8/30; X-ray ORDERED last WEEK showed suggestion of osteomyelitis of the first tuft of the right great toe. I looked at this x-ray indeed it does look damaged. He reminds me that he has rods and screws in his left arm from remote trauma. He will not be a candidate for an MRI. Nevertheless I think it is important to be sure of the diagnosis here. The patient also has severe PAD. Amputation of parts of the total toe may not heal. He started Levaquin last week. Using silver alginate on the wound Electronic Signature(s) Signed: 04/22/2020 4:22:50 PM By: [name], Braulio Kiedrowski MD Entered By: [name], Shaquna Geigle on 04/22/2020 09:00:38 -------------------------------------------------------------------------------- Physical Exam Details Patient Name: Date of Service: [name] BostonGREEN, [name] 04/22/2020 8:00 A M Medical Record Number: [id number] Patient Account Number: [id number] Date of Birth/Sex: Treating  RN: Jun 12, 1951 (69 y.o. [name] Primary Care Provider: [name] Other Clinician: Referring Provider: Treating Provider/Extender: Lacretia Leigh in Treatment: 12 Constitutional Patient is hypertensive.. Pulse regular and within target range for patient.Marland Kitchen Respirations regular, non-labored and within target range.. Temperature is normal and within the target range for the  patient.Marland Kitchen Appears in no distress. Cardiovascular Popliteal and femoral pulses are palpable on the right.. His dorsalis pedis pulses palpable in the right foot but not the posterior tibial. Notes Wound exam; right great toe plantar aspect. He has a hammer through the inner phalangeal joint which makes offloading difficult. The toe is swollen. I removed tightly adherent necrotic material from the wound surface hemostasis with direct pressure this does not probe to bone. There is no evidence of streaking up his foot Electronic Signature(s) Signed: 04/22/2020 4:22:50 PM By: Baltazar Najjar MD Entered By: Baltazar Najjar on 04/22/2020 09:02:23 -------------------------------------------------------------------------------- Physician Orders Details Patient Name: Date of Service: [name]. 04/22/2020 8:00 A M Medical Record Number: [id number] Patient Account Number: [id number] Date of Birth/Sex: Treating RN: 1950/09/04 (69 y.o. [name] Primary Care Provider: [name] Other Clinician: Referring Provider: Treating Provider/Extender: Lacretia Leigh in Treatment: 12 Verbal / Phone Orders: No Diagnosis Coding ICD-10 Coding Code Description E10.621 Type 1 diabetes mellitus with foot ulcer E10.51 Type 1 diabetes mellitus with diabetic peripheral angiopathy without gangrene E10.42 Type 1 diabetes mellitus with diabetic polyneuropathy L97.512 Non-pressure chronic ulcer of other part of right foot with fat layer exposed Follow-up Appointments Return Appointment in 2 weeks. Dressing Change Frequency Wound #1 Right T Great oe Change dressing every day. Wound Cleansing Wound #1 Right T Great oe May shower and wash wound with soap and water. Primary Wound Dressing Wound #1 Right T Great oe Calcium Alginate with Silver Secondary Dressing Wound #1 Right T Great oe Foam - foam donut or felt callous pad Kerlix/Rolled Gauze Dry Gauze Radiology Computed  Tomography (CT) Scan, right foot with contrast - Non healing ulcer on right great toe, rule out Osteomyelitis - (ICD10 E10.621 - Type 1 diabetes mellitus with foot ulcer) Patient Medications llergies: No Known Allergies A Notifications Medication Indication Start End ostelmyelitis right first 04/22/2020 Levaquin toe DOSE oral 500 mg tablet - 1 tablet oral qd for 2 weeks (continuing rx) osteomyleitis right first 04/22/2020 metronidazole toe DOSE oral 500 mg tablet - 1 tablet oral tid for 2 weels Electronic Signature(s) Signed: 04/22/2020 9:05:35 AM By: Baltazar Najjar MD Entered By: Baltazar Najjar on 04/22/2020 09:05:34 Prescription 04/22/2020 -------------------------------------------------------------------------------- Arville Care MD Patient Name: Provider: April 22, 1951 [id number] Date of Birth: NPI#[name] [id number] Sex: DEA #: [phone] [id number] Phone #: License #: Eligha Bridegroom [facility] Patient Address: [address] [place], [place] [address] [address] [place], [place] [address] [phone] Allergies No Known Allergies Provider's Orders Computed Tomography (CT) Scan, right foot with contrast - ICD10: E10.621 - Non healing ulcer on right great toe, rule out Osteomyelitis Hand Signature: Date(s): Electronic Signature(s) Signed: 04/22/2020 4:22:50 PM By: Baltazar Najjar MD Entered By: Baltazar Najjar on 04/22/2020 09:05:36 -------------------------------------------------------------------------------- Problem List Details Patient Name: Date of Service: [name]. 04/22/2020 8:00 A M Medical Record Number: [id number] Patient Account Number: [id number] Date of Birth/Sex: Treating RN: [birth year] (69 y.o. 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 He is a type I diabetic on an insulin pump diagnosed at age 74. He also has known peripheral neuropathy. He has been dealing with a wound on his right first toe since at least February by review of epic. He was seen by Dr. Lajoyce Corners. Diagnosed with an ischemic wound sent to see Dr. Myra Gianotti him who noted a noncompressible ABI 1.67 and a TBI of 0.25 monophasic waveforms. On 11/15/2019 he underwent a right femoral to below-knee popliteal bypass with an ipsilateral saphenous vein. He also underwent extensive endarterectomy I believe that the external iliac. Postoperative ABI was 1.16 he has been using Neosporin on this and gradually making progress  according to his companion. He is offloading this with a hole in the inserts in his work shoes. Per his companion who does the dressings the wound has been getting smaller Past medical history; type 1 diabetes with PAD and PN, history of a left BKA in 2017, coronary artery disease, hypercholesterolemia, hypertension he is a smoker 6/29; this is a patient I have not seen in 4 weeks. He is a type I diabetic on an insulin pump with a prior left BKA. He was revascularized before he came to our clinic with a right femoral to below-knee popliteal bypass with a saphenous vein. He also had an extensive endarterectomy. We used Hydrofera Blue to this wound. 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 Wears work boots. 7/13; type I diabetic with an area on the plantar tip of his right great toe in the setting of a previous left BKA and extensive endarterectomy with a right femoral to below-knee popliteal bypass before he came to our clinic. We have been using Hydrofera Blue the wound is measuring small. 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 Previous left BKA. He is also had an extensive endarterectomy with a right femoral to below-knee popliteal bypass before he came here. We have been using Hydrofera Blue. Nice improvements in the wound which is small and superficial now. [facility]. I think he made this on his own but we have talked about this last week. He has a hammer deformity of the toe. I also wondered if they could be helpful in modifying his foot wear. 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 For the next 3 weeks he is traveling with his wife and therefore will not be outdoors as much on his farm. 8/17; patient returns to clinic today with a much bigger wound than what I was used to seeing 3 weeks ago. I have looked through his arterial studies from 7/12. At that point he had noncompressible ABIs at 1.72 biphasic waveforms at the PTA and monophasic waveforms at the dorsalis pedis. Unfortunately they did not do a TBI on the great toe because it was bandaged. The interpretation however suggested waveforms showed adequate perfusion. The patient states that he saw Dr. Myra Gianotti about a month ago, I will see if I can pull this record. As noted previously the patient is a very active man works with a left below-knee prosthesis. The great toe has a bit of a hammer deformity to it. He is trying to offload this using modified insoles in his shoes i.e. cutting out a part of this to lift the toe off the bottom of his shoe. 8/23; culture grew abundant amount of Enterobacter and a few methicillin sensitive staph aureus. I empirically put him on Levaquin in response to this although we were not able to get a hold of him by phone to let them know that the antibiotic was at his pharmacy therefore he has not started it yet. I encouraged him to start today. The wound is larger, a lot of drainage. There is nothing that probes to bone however we are certainly not making any progress. He is attempting to modify his foot wear. 8/30; X-ray ORDERED last WEEK showed suggestion of osteomyelitis of the first tuft of the right great toe. I looked at this x-ray indeed it does look damaged. He reminds me that he has rods and screws in his left arm from remote trauma. He will not be a candidate for an MRI. Nevertheless I think it is important to be sure of the diagnosis here. The patient also has severe PAD. Amputation of parts of the total  toe may not heal. He started Levaquin last week. Using silver alginate on the wound Objective Constitutional Patient is hypertensive.. Pulse regular and within target range for patient.Marland Kitchen Respirations regular, non-labored and within target range.. Temperature is normal and within the target range for the patient.Marland Kitchen Appears in no distress. Vitals Time Taken: 8:06 AM, Height: 71 in, Weight: 152 lbs, BMI: 21.2, Temperature: 97.7 F, Pulse: 61 bpm, Respiratory Rate: 18 breaths/min, Blood Pressure: 156/68 mmHg. Cardiovascular Popliteal and femoral pulses are palpable on the right.. His dorsalis pedis pulses palpable in the right foot but not the posterior tibial. General Notes: Wound exam; right great toe plantar aspect. He has a hammer through the inner phalangeal joint which makes offloading difficult. The toe is swollen. I removed tightly adherent necrotic material from the wound surface hemostasis with direct pressure this does not probe to bone. There is no evidence of streaking up his foot Integumentary (Hair, Skin) Wound #1 status is Open. Original cause of wound was Gradually Appeared. The wound is located on the Right T Great. The wound measures 1.9cm length x oe 2cm width x 0.1cm depth; 2.985cm^2 area and 0.298cm^3 volume. There is Fat Layer (Subcutaneous Tissue) exposed. There  is no tunneling or undermining noted. There is a large amount of serosanguineous drainage noted. The wound margin is flat and intact. There is medium (34-66%) pink granulation within the wound bed. There is a medium (34-66%) amount of necrotic tissue within the wound bed including Eschar and Adherent Slough. Assessment Active Problems ICD-10 Type 1 diabetes mellitus with foot ulcer Non-pressure chronic ulcer of other part of right foot with fat layer exposed Type 1 diabetes mellitus with diabetic peripheral angiopathy without gangrene Type 1 diabetes mellitus with diabetic polyneuropathy Other chronic  osteomyelitis, right ankle and foot Procedures Wound #1 Pre-procedure diagnosis of Wound #1 is a Diabetic Wound/Ulcer of the Lower Extremity located on the Right T Great .Severity of Tissue Pre Debridement is: oe Fat layer exposed. There was a Excisional Skin/Subcutaneous Tissue Debridement with a total area of 3.8 sq cm performed by Maxwell Caul., MD. With the following instrument(s): Curette to remove Viable and Non-Viable tissue/material. Material removed includes Eschar, Subcutaneous Tissue, and Slough. No specimens were taken. A time out was conducted at 08:33, prior to the start of the procedure. A Minimum amount of bleeding was controlled with Pressure. The procedure was tolerated well with a pain level of 0 throughout and a pain level of 0 following the procedure. Post Debridement Measurements: 1.9cm length x 2cm width x 0.1cm depth; 0.298cm^3 volume. Character of Wound/Ulcer Post Debridement is improved. Severity of Tissue Post Debridement is: Fat layer exposed. Post procedure Diagnosis Wound #1: Same as Pre-Procedure Plan Follow-up Appointments: Return Appointment in 2 weeks. Dressing Change Frequency: Wound #1 Right T Great: oe Change dressing every day. Wound Cleansing: Wound #1 Right T Great: oe May shower and wash wound with soap and water. Primary Wound Dressing: Wound #1 Right T Great: oe Calcium Alginate with Silver Secondary Dressing: Wound #1 Right T Great: oe Foam - foam donut or felt callous pad Kerlix/Rolled Gauze Dry Gauze Radiology ordered were: Computed Tomography (CT) Scan, right foot with contrast - Non healing ulcer on right great toe, rule out Osteomyelitis The following medication(s) was prescribed: Levaquin oral 500 mg tablet 1 tablet oral qd for 2 weeks (continuing rx) for ostelmyelitis right first toe starting 04/22/2020 metronidazole oral 500 mg tablet 1 tablet oral tid for 2 weels for osteomyleitis right first toe starting 04/22/2020 1.  Wound surfaces debrided to remove subcutaneous tissue. Surface cleans up quite nicely. Further debridements may be necessary. 2. Plain x-ray which I reviewed certainly looked like osteomyelitis. Nevertheless I feel the need to be more certain about the exact diagnosis here given the fact that he has a complicated diabetic foot wound in the plantar right great toe in the setting of significant PAD. 3. I have extended his Levaquin and added Flagyl. This would be the empiric treatment for probably 5 to 6 weeks for the underlying osteomyelitis. I am fairly confident he has this 4. We have always had a problem with offloading this. The patient is in a left leg BKA. He works actively on his farm. He is not a candidate therefore for any additional offloading on the right foot. 5. He is concerned about any degree of amputation he tells me that 5 years ago he had a right great toe amputation after revascularization ended up with a below-knee amputation. I have told him that these outcomes are possible. Hopefully we can get away with a partial amputation of the right great toe if he elects to go that route. For now I have started him on broad-spectrum antibiotics  with good penetration to bone while we await the CT scan Electronic Signature(s) Signed: 04/22/2020 4:22:50 PM By: Baltazar Najjar MD Entered By: Baltazar Najjar on 04/22/2020 09:07:55 -------------------------------------------------------------------------------- SuperBill Details Patient Name: Date of Service: [name] 04/22/2020 Medical Record Number: [id number] Patient Account Number: [id number] Date of Birth/Sex: Treating RN: 11/22/1950 (69 y.o. 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, right ankle and foot Facility Procedures CPT4 Code: [id number] Description: 11042 - DEB SUBQ TISSUE 20 SQ CM/< ICD-10 Diagnosis Description L97.512 Non-pressure chronic ulcer of other part of right foot with fat layer exposed E10.621 Type 1 diabetes mellitus with foot ulcer Modifier: Quantity: 1 Physician Procedures : CPT4 Code Description Modifier [id number] 11042 - WC PHYS SUBQ TISS 20 SQ CM ICD-10 Diagnosis Description L97.512 Non-pressure chronic ulcer of other part of right foot with fat layer exposed E10.621 Type 1 diabetes mellitus with foot ulcer Quantity: 1 Electronic Signature(s) Signed: 04/22/2020 4:22:50 PM By: Baltazar Najjar MD Entered By: Baltazar Najjar on 04/22/2020 09:08:15

## 2020-04-22 NOTE — Progress Notes (Signed)
ALFONSE, GARRINGER ([id number]) Visit Report for 04/22/2020 Arrival Information Details Patient Name: Date of Service: JAECION, DEMPSTER 04/22/2020 8:00 A M Medical Record Number: [id number] Patient Account Number: [id number] Date of Birth/Sex: Treating RN: 21-Dec-1950 (69 y.o. [name] Primary Care Jerritt Cardoza: [name] Other Clinician: Referring Uyen Eichholz: Treating Jarett Dralle/Extender: [name] in Treatment: 12 Visit Information History Since Last Visit Added or deleted any medications: No Patient Arrived: Ambulatory Any new allergies or adverse reactions: No Arrival Time: 08:06 Had a fall or experienced change in No Accompanied By: self activities of daily living that may affect Transfer Assistance: None risk of falls: Patient Identification Verified: Yes Signs or symptoms of abuse/neglect since last visito No Secondary Verification Process Completed: Yes Hospitalized since last visit: No Patient Requires Transmission-Based Precautions: No Implantable device outside of the clinic excluding No Patient Has Alerts: Yes cellular tissue based products placed in the center Patient Alerts: R ABI non compressible since last visit: Has Dressing in Place as Prescribed: Yes Pain Present Now: No Electronic Signature(s) Signed: 04/22/2020 4:24:12 PM By: Levan Hurst RN, BSN Entered By: Levan Hurst on 04/22/2020 08:31:15 -------------------------------------------------------------------------------- Encounter Discharge Information Details Patient Name: Date of Service: [name] 04/22/2020 8:00 A M Medical Record Number: [id number] Patient Account Number: [id number] Date of Birth/Sex: Treating RN: [birth year] (69 y.o. [name] Primary Care Daly Whipkey: [name] Other Clinician: Referring Amani Marseille: Treating Braedon Sjogren/Extender: [name] in Treatment: 12 Encounter Discharge Information Items Post Procedure  Vitals Discharge Condition: Stable Temperature (F): 97.7 Ambulatory Status: Ambulatory Pulse (bpm): 61 Discharge Destination: Home Respiratory Rate (breaths/min): 18 Transportation: Private Auto Blood Pressure (mmHg): 156/68 Accompanied By: wife Schedule Follow-up Appointment: Yes Clinical Summary of Care: Patient Declined Electronic Signature(s) Signed: 04/22/2020 4:26:23 PM By: Kela Millin Entered By: Kela Millin on 04/22/2020 08:59:42 -------------------------------------------------------------------------------- Lower Extremity Assessment Details Patient Name: Date of Service: Lupita Leash 04/22/2020 8:00 A M Medical Record Number: [id number] Patient Account Number: [id number] Date of Birth/Sex: Treating RN: April 16, 1951 (69 y.o. [name] Primary Care Andranik Jeune: [name] Other Clinician: Referring Shereena Berquist: Treating Adis Sturgill/Extender: [name] in Treatment: 12 Edema Assessment Assessed: Shirlyn Goltz: No] Patrice Paradise: No] Edema: [Left: Ye] [Right: s] Calf Left: Right: Point of Measurement: cm From Medial Instep cm 34 cm Ankle Left: Right: Point of Measurement: cm From Medial Instep cm 23.8 cm Vascular Assessment Pulses: Dorsalis Pedis Palpable: [Right:Yes] Electronic Signature(s) Signed: 04/22/2020 4:26:23 PM By: Kela Millin Entered By: Kela Millin on 04/22/2020 08:08:04 -------------------------------------------------------------------------------- Multi Wound Chart Details Patient Name: Date of Service: [name] 04/22/2020 8:00 A M Medical Record Number: [id number] Patient Account Number: [id number] Date of Birth/Sex: Treating RN: April 30, 1951 (69 y.o. Janyth Contes Primary Care Ajani Schnieders: [name] Other Clinician: Referring Tambria Pfannenstiel: Treating Nolyn Swab/Extender: [name] in Treatment: 12 Vital Signs Height(in): 71 Pulse(bpm): Weight(lbs): 482 Blood  Pressure(mmHg): Body Mass Index(BMI): 21 Temperature(F): Respiratory Rate(breaths/min): 18 Photos: [1:No Photos Right T Great oe] [N/A:N/A N/A] Wound Location: [1:Gradually Appeared] [N/A:N/A] Wounding Event: [1:Diabetic Wound/Ulcer of the Lower] [N/A:N/A] Primary Etiology: [1:Extremity Cataracts, Coronary Artery Disease,] [N/A:N/A] Comorbid History: [1:Peripheral Arterial Disease, Type I Diabetes, Osteomyelitis, Neuropathy 11/23/2019] [N/A:N/A] Date Acquired: [1:12] [N/A:N/A] Weeks of Treatment: [1:Open] [N/A:N/A] Wound Status: [1:1.9x2x0.1] [N/A:N/A] Measurements L x W x D (cm) [1:2.985] [N/A:N/A] A (cm) : rea [1:0.298] [N/A:N/A] Volume (cm) : [1:-2429.70%] [N/A:N/A] % Reduction in A rea: [1:-2383.30%] [N/A:N/A] % Reduction in Volume: [1:Grade 2] [N/A:N/A] Classification: [1:Large] [N/A:N/A] Exudate A mount: [1:Serosanguineous] [N/A:N/A]  Exudate Type: [1:red, brown] [N/A:N/A] Exudate Color: [1:Flat and Intact] [N/A:N/A] Wound Margin: [1:Medium (34-66%)] [N/A:N/A] Granulation A mount: [1:Pink] [N/A:N/A] Granulation Quality: [1:Medium (34-66%)] [N/A:N/A] Necrotic A mount: [1:Eschar, Adherent Slough] [N/A:N/A] Necrotic Tissue: [1:Fat Layer (Subcutaneous Tissue): Yes N/A] Exposed Structures: [1:Fascia: No Tendon: No Muscle: No Joint: No Bone: No None] [N/A:N/A] Epithelialization: [1:Debridement - Excisional] [N/A:N/A] Debridement: Pre-procedure Verification/Time Out 08:33 [N/A:N/A] Taken: [1:Necrotic/Eschar, Subcutaneous,] [N/A:N/A] Tissue Debrided: [1:Slough Skin/Subcutaneous Tissue] [N/A:N/A] Level: [1:3.8] [N/A:N/A] Debridement A (sq cm): [1:rea Curette] [N/A:N/A] Instrument: [1:Minimum] [N/A:N/A] Bleeding: [1:Pressure] [N/A:N/A] Hemostasis Achieved: [1:0] [N/A:N/A] Procedural Pain: [1:0] [N/A:N/A] Post Procedural Pain: Debridement Treatment Response: Procedure was tolerated well [N/A:N/A] Post Debridement Measurements L x 1.9x2x0.1 [N/A:N/A] W x D (cm) [1:0.298]  [N/A:N/A] Post Debridement Volume: (cm) [1:Debridement] [N/A:N/A] Treatment Notes Wound #1 (Right Toe Great) 1. Cleanse With Wound Cleanser 3. Primary Dressing Applied Calcium Alginate Ag 4. Secondary Dressing Dry Gauze Roll Gauze Foam 5. Secured With Tape Notes foam donut. netting Electronic Signature(s) Signed: 04/22/2020 4:22:50 PM By: Baltazar Najjar MD Signed: 04/22/2020 4:24:12 PM By: Zandra Abts RN, BSN Entered By: Baltazar Najjar on 04/22/2020 08:58:32 -------------------------------------------------------------------------------- Multi-Disciplinary Care Plan Details Patient Name: Date of Service: [name] 04/22/2020 8:00 A M Medical Record Number: [id number] Patient Account Number: [id number] Date of Birth/Sex: Treating RN: April 01, 1951 (69 y.o. [name] Primary Care Rosita Guzzetta: [name] Other Clinician: Referring Jeena Arnett: Treating Quilla Freeze/Extender: Lacretia Leigh in Treatment: 12 Active Inactive Wound/Skin Impairment Nursing Diagnoses: Knowledge deficit related to ulceration/compromised skin integrity Goals: Patient/caregiver will verbalize understanding of skin care regimen Date Initiated: 01/23/2020 Target Resolution Date: 05/24/2020 Goal Status: Active Ulcer/skin breakdown will have a volume reduction of 30% by week 4 Date Initiated: 01/23/2020 Date Inactivated: 03/05/2020 Target Resolution Date: 02/22/2020 Goal Status: Met Ulcer/skin breakdown will have a volume reduction of 50% by week 8 Date Initiated: 03/05/2020 Date Inactivated: 04/02/2020 Target Resolution Date: 03/24/2020 Goal Status: Unmet Unmet Reason: comorbities Ulcer/skin breakdown will have a volume reduction of 80% by week 12 Date Initiated: 04/02/2020 Date Inactivated: 04/22/2020 Target Resolution Date: 04/24/2020 Goal Status: Unmet Unmet Reason: Osteomyelitis Interventions: Assess patient/caregiver ability to obtain necessary supplies Assess  patient/caregiver ability to perform ulcer/skin care regimen upon admission and as needed Assess ulceration(s) every visit Notes: Electronic Signature(s) Signed: 04/22/2020 4:24:12 PM By: Zandra Abts RN, BSN Entered By: Zandra Abts on 04/22/2020 08:37:13 -------------------------------------------------------------------------------- Pain Assessment Details Patient Name: Date of Service: [name] 04/22/2020 8:00 A M Medical Record Number: [id number] Patient Account Number: [id number] Date of Birth/Sex: Treating RN: Mar 22, 1951 (69 y.o. [name] Primary Care Decie Verne: [name] Other Clinician: Referring Annaliesa Blann: Treating Brylon Brenning/Extender: Lacretia Leigh in Treatment: 12 Active Problems Location of Pain Severity and Description of Pain Patient Has Paino No Site Locations Pain Management and Medication Current Pain Management: Electronic Signature(s) Signed: 04/22/2020 4:26:23 PM By: Cherylin Mylar Entered By: Cherylin Mylar on 04/22/2020 08:07:57 -------------------------------------------------------------------------------- Patient/Caregiver Education Details Patient Name: Date of Service: [name] 8/30/2021andnbsp8:00 A M Medical Record Number: [id number] Patient Account Number: [id number] Date of Birth/Gender: Treating RN: 1951/05/20 (69 y.o. [name] Primary Care Physician: [name] Other Clinician: Referring Physician: Treating Physician/Extender: Lacretia Leigh in Treatment: 12 Education Assessment Education Provided To: Patient Education Topics Provided Infection: Methods: Explain/Verbal Responses: State content correctly Wound/Skin Impairment: Methods: Explain/Verbal Responses: State content correctly Electronic Signature(s) Signed: 04/22/2020 4:24:12 PM By: Zandra Abts RN, BSN Entered By: Zandra Abts on 04/22/2020  08:37:36 -------------------------------------------------------------------------------- Wound Assessment Details Patient Name:  Date of Service: ELYON, ZOLL 04/22/2020 8:00 A M Medical Record Number: [id number] Patient Account Number: [id number] Date of Birth/Sex: Treating RN: Apr 12, 1951 (69 y.o. [name] Primary Care Chanda Laperle: [name] Other Clinician: Referring Sareen Randon: Treating Blas Riches/Extender: [name] in Treatment: 12 Wound Status Wound Number: 1 Primary Diabetic Wound/Ulcer of the Lower Extremity Etiology: Wound Location: Right T Great oe Wound Open Wounding Event: Gradually Appeared Status: Date Acquired: 11/23/2019 Comorbid Cataracts, Coronary Artery Disease, Peripheral Arterial Disease, Weeks Of Treatment: 12 History: Type I Diabetes, Osteomyelitis, Neuropathy Clustered Wound: No Wound Measurements Length: (cm) 1.9 Width: (cm) 2 Depth: (cm) 0.1 Area: (cm) 2.985 Volume: (cm) 0.298 % Reduction in Area: -2429.7% % Reduction in Volume: -2383.3% Epithelialization: None Tunneling: No Undermining: No Wound Description Classification: Grade 2 Wound Margin: Flat and Intact Exudate Amount: Large Exudate Type: Serosanguineous Exudate Color: red, brown Foul Odor After Cleansing: No Slough/Fibrino Yes Wound Bed Granulation Amount: Medium (34-66%) Exposed Structure Granulation Quality: Pink Fascia Exposed: No Necrotic Amount: Medium (34-66%) Fat Layer (Subcutaneous Tissue) Exposed: Yes Necrotic Quality: Eschar, Adherent Slough Tendon Exposed: No Muscle Exposed: No Joint Exposed: No Bone Exposed: No Treatment Notes Wound #1 (Right Toe Great) 1. Cleanse With Wound Cleanser 3. Primary Dressing Applied Calcium Alginate Ag 4. Secondary Dressing Dry Gauze Roll Gauze Foam 5. Secured With Tape Notes foam donut. netting Electronic Signature(s) Signed: 04/22/2020 4:26:23 PM By: Kela Millin Entered By:  Kela Millin on 04/22/2020 08:10:43 -------------------------------------------------------------------------------- Vitals Details Patient Name: Date of Service: [name] 04/22/2020 8:00 A M Medical Record Number: [id number] Patient Account Number: [id number] Date of Birth/Sex: Treating RN: 01/05/1951 (69 y.o. [name] Primary Care Alazae Crymes: [name] Other Clinician: Referring Kerstie Agent: Treating Tessla Spurling/Extender: [name] in Treatment: 12 Vital Signs Time Taken: 08:06 Temperature (F): 97.7 Height (in): 71 Pulse (bpm): 61 Weight (lbs): 152 Respiratory Rate (breaths/min): 18 Body Mass Index (BMI): 21.2 Blood Pressure (mmHg): 156/68 Reference Range: 80 - 120 mg / dl Electronic Signature(s) Signed: 04/22/2020 4:26:23 PM By: Kela Millin Entered By: Kela Millin on 04/22/2020 08:58:47

## 2020-04-23 ENCOUNTER — Ambulatory Visit: Payer: Medicare Other | Admitting: Podiatry

## 2020-04-23 NOTE — Progress Notes (Deleted)
No show for appt. 

## 2020-05-06 ENCOUNTER — Other Ambulatory Visit: Payer: Self-pay

## 2020-05-06 ENCOUNTER — Encounter (HOSPITAL_BASED_OUTPATIENT_CLINIC_OR_DEPARTMENT_OTHER): Payer: Medicare Other | Attending: Internal Medicine | Admitting: Internal Medicine

## 2020-05-06 ENCOUNTER — Other Ambulatory Visit: Payer: Self-pay | Admitting: Internal Medicine

## 2020-05-06 ENCOUNTER — Other Ambulatory Visit (HOSPITAL_COMMUNITY): Payer: Self-pay | Admitting: Internal Medicine

## 2020-05-06 DIAGNOSIS — E7800 Pure hypercholesterolemia, unspecified: Secondary | ICD-10-CM | POA: Diagnosis not present

## 2020-05-06 DIAGNOSIS — I10 Essential (primary) hypertension: Secondary | ICD-10-CM | POA: Insufficient documentation

## 2020-05-06 DIAGNOSIS — E10621 Type 1 diabetes mellitus with foot ulcer: Secondary | ICD-10-CM | POA: Insufficient documentation

## 2020-05-06 DIAGNOSIS — I2510 Atherosclerotic heart disease of native coronary artery without angina pectoris: Secondary | ICD-10-CM | POA: Diagnosis not present

## 2020-05-06 DIAGNOSIS — L97512 Non-pressure chronic ulcer of other part of right foot with fat layer exposed: Secondary | ICD-10-CM | POA: Insufficient documentation

## 2020-05-06 DIAGNOSIS — E1136 Type 2 diabetes mellitus with diabetic cataract: Secondary | ICD-10-CM | POA: Diagnosis not present

## 2020-05-06 DIAGNOSIS — Z89512 Acquired absence of left leg below knee: Secondary | ICD-10-CM | POA: Insufficient documentation

## 2020-05-06 DIAGNOSIS — E1051 Type 1 diabetes mellitus with diabetic peripheral angiopathy without gangrene: Secondary | ICD-10-CM

## 2020-05-06 DIAGNOSIS — L97509 Non-pressure chronic ulcer of other part of unspecified foot with unspecified severity: Secondary | ICD-10-CM

## 2020-05-06 DIAGNOSIS — M86671 Other chronic osteomyelitis, right ankle and foot: Secondary | ICD-10-CM | POA: Insufficient documentation

## 2020-05-06 DIAGNOSIS — Z9641 Presence of insulin pump (external) (internal): Secondary | ICD-10-CM | POA: Diagnosis not present

## 2020-05-06 DIAGNOSIS — E1042 Type 1 diabetes mellitus with diabetic polyneuropathy: Secondary | ICD-10-CM | POA: Diagnosis not present

## 2020-05-06 DIAGNOSIS — E1069 Type 1 diabetes mellitus with other specified complication: Secondary | ICD-10-CM | POA: Diagnosis not present

## 2020-05-07 NOTE — Progress Notes (Signed)
TYNELL, WINCHELL ([id number]) Visit Report for 05/06/2020 Debridement Details Patient Name: Date of Service: [name], [name] 05/06/2020 8:00 A M Medical Record Number: [id number] Patient Account Number: [id number] Date of Birth/Sex: Treating RN: 18-Apr-1951 (69 y.o. M) Primary Care Provider: [name] Other Clinician: Referring Provider: Treating Provider/Extender: Lacretia Leigh in Treatment: 14 Debridement Performed for Assessment: Wound #1 Right T Great oe Performed By: Physician Maxwell Caul., MD Debridement Type: Debridement Severity of Tissue Pre Debridement: Fat layer exposed Level of Consciousness (Pre-procedure): Awake and Alert Pre-procedure Verification/Time Out Yes - 08:55 Taken: Start Time: 08:55 T Area Debrided (L x W): otal 1 (cm) x 1 (cm) = 1 (cm) Tissue and other material debrided: Viable, Non-Viable, Slough, Subcutaneous, Slough Level: Skin/Subcutaneous Tissue Debridement Description: Excisional Instrument: Curette Bleeding: Minimum Hemostasis Achieved: Pressure End Time: 08:56 Procedural Pain: 0 Post Procedural Pain: 0 Response to Treatment: Procedure was tolerated well Level of Consciousness (Post- Awake and Alert procedure): Post Debridement Measurements of Total Wound Length: (cm) 1 Width: (cm) 1 Depth: (cm) 0.1 Volume: (cm) 0.079 Character of Wound/Ulcer Post Debridement: Improved Severity of Tissue Post Debridement: Fat layer exposed Post Procedure Diagnosis Same as Pre-procedure Electronic Signature(s) Signed: 05/07/2020 5:12:45 PM By: Baltazar Najjar MD Entered By: Baltazar Najjar on 05/06/2020 09:23:11 -------------------------------------------------------------------------------- HPI Details Patient Name: Date of Service: [name]. 05/06/2020 8:00 A M Medical Record Number: [id number] Patient Account Number: [id number] Date of Birth/Sex: Treating RN: August 01, 1951 (69 y.o. 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 He is a type I diabetic on an insulin pump diagnosed at age 8. He also has known peripheral neuropathy. He has been dealing with a wound on his right first toe since at least February by review of epic. He was seen by Dr. Lajoyce Corners. Diagnosed with an ischemic wound sent to see Dr. Myra Gianotti him who noted a noncompressible ABI 1.67 and a TBI of 0.25 monophasic waveforms. On 11/15/2019 he underwent a right femoral to below-knee popliteal bypass with an ipsilateral saphenous vein. He also underwent extensive endarterectomy I believe that the external iliac. Postoperative ABI was 1.16 he has been using Neosporin on this and gradually making progress according to his companion. He is offloading this with a hole in the inserts in his work shoes. Per his companion who does the dressings the wound has been getting smaller Past medical history; type 1 diabetes with PAD and PN, history of a left BKA in 2017, coronary artery disease, hypercholesterolemia, hypertension he is a smoker 6/29; this is a patient I have not seen in 4 weeks. He is a type I diabetic on an insulin pump with a prior left BKA. He was revascularized before he came to our clinic with a right femoral to below-knee popliteal bypass with a saphenous vein. He also had an extensive endarterectomy. We used Hydrofera Blue to this wound. 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 Wears work boots. 7/13; type I diabetic with an area on the plantar tip of his right great toe in the setting of a previous left BKA and extensive endarterectomy with a right femoral to below-knee popliteal bypass before he came to our clinic. We have been using Hydrofera Blue the wound is measuring small. He is  still a very active man  working on his own farm there just is not a way for him to offload this 7/27; type I diabetic with a plantar wound on the tip of his right great toe. Previous left BKA. He is also had an extensive endarterectomy with a right femoral to below-knee popliteal bypass before he came here. We have been using Hydrofera Blue. Nice improvements in the wound which is small and superficial now. He has an appointment tomorrow [facility]. I think he made this on his own but we have talked about this last week. He has a hammer deformity of the toe. I also wondered if they could be helpful in modifying his foot wear. 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 For the next 3 weeks he is traveling with his wife and therefore will not be outdoors as much on his farm. 8/17; patient returns to clinic today with a much bigger wound than what I was used to seeing 3 weeks ago. I have looked through his arterial studies from 7/12. At that point he had noncompressible ABIs at 1.72 biphasic waveforms at the PTA and monophasic waveforms at the dorsalis pedis. Unfortunately they did not do a TBI on the great toe because it was bandaged. The interpretation however suggested waveforms showed adequate perfusion. The patient states that he saw Dr. Myra Gianotti about a month ago, I will see if I can pull this record. As noted previously the patient is a very active man works with a left below-knee prosthesis. The great toe has a bit of a hammer deformity to it. He is trying to offload this using modified insoles in his shoes i.e. cutting out a part of this to lift the toe off the bottom of his  shoe. 8/23; culture grew abundant amount of Enterobacter and a few methicillin sensitive staph aureus. I empirically put him on Levaquin in response to this although we were not able to get a hold of him by phone to let them know that the antibiotic was at his pharmacy therefore he has not started it yet. I encouraged him to start today. The wound is larger, a lot of drainage. There is nothing that probes to bone however we are certainly not making any progress. He is attempting to modify his foot wear. 8/30; X-ray ORDERED last WEEK showed suggestion of osteomyelitis of the first tuft of the right great toe. I looked at this x-ray indeed it does look damaged. He reminds me that he has rods and screws in his left arm from remote trauma. He will not be a candidate for an MRI. Nevertheless I think it is important to be sure of the diagnosis here. The patient also has severe PAD. Amputation of parts of the total toe may not heal. He started Levaquin last week. Using silver alginate on the wound 9/13; we still do not have a CT scan. He is completing 2 weeks of Levaquin and 2 weeks of Flagyl. Most of his complaints of side effects I think are related to the Flagyl but that can stop as of when he finishes his medications either later today or tomorrow. He tells me that he has been using  Neosporin on this for the last week, thinking that the silver alginate causes drainage. I do not know told him I thought this would be unlikely. It looks like he is offloading this better Electronic Signature(s) Signed: 05/07/2020 5:12:45 PM By: Baltazar Najjar MD Entered By: Baltazar Najjar on 05/06/2020 09:24:25 -------------------------------------------------------------------------------- Physical Exam Details Patient Name: Date of Service: [name]. 05/06/2020 8:00 A M Medical Record Number: [id number] Patient Account Number: [id number] Date of Birth/Sex: Treating RN: March 15, 1951 (69 y.o. M) Primary Care  Provider: [name] Other Clinician: Referring Provider: Treating Provider/Extender: Lacretia Leigh in Treatment: 14 Constitutional Patient is hypertensive.. Pulse regular and within target range for patient.Marland Kitchen Respirations regular, non-labored and within target range.. Temperature is normal and within the target range for the patient.Marland Kitchen Appears in no distress. Notes Wound exam; right great toe plantar aspect in the distal phalanx. Wound actually looks somewhat better. Some surface debris on the wound but not much callus. He has rims of epithelialization. Overall the wound looks better. I used a #3 curette to remove the fibrinous surface debris. Hemostasis with direct pressure Electronic Signature(s) Signed: 05/07/2020 5:12:45 PM By: Baltazar Najjar MD Entered By: Baltazar Najjar on 05/06/2020 09:25:59 -------------------------------------------------------------------------------- Physician Orders Details Patient Name: Date of Service: [name]. 05/06/2020 8:00 A M Medical Record Number: [id number] Patient Account Number: [id number] Date of Birth/Sex: Treating RN: [birth year] (69 y.o. 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 Dressing Change Frequency Wound #1 Right T Great oe Change dressing every day. Wound Cleansing Wound #1 Right T Great oe May shower and wash  wound with soap and water. Primary Wound Dressing Wound #1 Right T Great oe Calcium Alginate with Silver Secondary Dressing Wound #1 Right T Great oe Foam - foam donut or felt callous pad Kerlix/Rolled Gauze Dry Gauze Patient Medications llergies: No Known Allergies A Notifications Medication Indication Start End wound infection o 05/06/2020 Levaquin osteomyelitis DOSE oral 500 mg tablet - 1 tablet oral oral daily for a further 2 weeks (continuing rx) Electronic Signature(s) Signed: 05/06/2020 9:28:51 AM By: Baltazar Najjar MD Entered By: Baltazar Najjar on 05/06/2020 09:28:50 -------------------------------------------------------------------------------- Problem List Details Patient Name: Date of Service: [name]. 05/06/2020 8:00 A M Medical Record Number: [id number] Patient Account Number: [id number] Date of Birth/Sex: Treating RN: [birth year] (69 y.o. 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 05/06/2020 8:00 A M Medical Record  Number: [id number] Patient Account Number: [id number] Date of Birth/Sex: Treating RN: September 17, 1950 (69 y.o. 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 He is a type I diabetic on an insulin pump diagnosed at age 89. He also has known peripheral neuropathy. He has been dealing with a wound on his right first toe since at least February by review of epic. He was seen by Dr. Lajoyce Cornersuda. Diagnosed with an ischemic wound sent to see Dr. Myra GianottiBrabham him who noted a noncompressible ABI 1.67 and a TBI of 0.25 monophasic waveforms. On 11/15/2019 he underwent a right femoral to below-knee popliteal bypass with an ipsilateral saphenous vein. He also underwent extensive endarterectomy I believe that the external iliac. Postoperative ABI was 1.16 he has been using Neosporin on this and gradually making progress according to his companion. He is offloading this with a hole in the inserts in his work shoes. Per his companion who does the dressings the wound has been getting smaller Past medical history; type 1 diabetes with PAD and PN, history of a left BKA in 2017, coronary artery disease, hypercholesterolemia, hypertension he is a smoker 6/29; this is a patient I have not seen in 4 weeks. He is a type I diabetic on an insulin pump with a prior left BKA. He was revascularized before he came to our clinic with a right femoral to below-knee popliteal bypass with a saphenous vein. He also had an extensive endarterectomy. We used Hydrofera Blue to this wound. 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 Wears work boots. 7/13; type I diabetic with an area on the plantar tip of his right great toe in the setting of a previous left BKA and extensive endarterectomy with a  right femoral to below-knee popliteal bypass before he came to our clinic. We have been using Hydrofera Blue the wound is measuring small. 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 Previous left BKA. He is also had an extensive endarterectomy with a right femoral to below-knee popliteal bypass before he came here. We have been using Hydrofera Blue. Nice improvements in the wound which is small and superficial now. He has [facility] appointment [facility] I think he made this on his own but we have talked about this last week. He has a hammer deformity of the toe. I also wondered if they could be helpful in modifying his foot wear. 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 For the next 3 weeks he is traveling with his wife and therefore will not be outdoors as much on his farm. 8/17; patient returns to clinic today with a much bigger wound than what I was used to seeing 3 weeks ago. I have  looked through his arterial studies from 7/12. At that point he had noncompressible ABIs at 1.72 biphasic waveforms at the PTA and monophasic waveforms at the dorsalis pedis. Unfortunately they did not do a TBI on the great toe because it was bandaged. The interpretation however suggested waveforms showed adequate perfusion. The patient states that he saw Dr. Myra Gianotti about a month ago, I will see if I can pull this record. As noted previously the patient is a very active man works with a left below-knee prosthesis. The great toe has a bit of a hammer deformity to  it. He is trying to offload this using modified insoles in his shoes i.e. cutting out a part of this to lift the toe off the bottom of his shoe. 8/23; culture grew abundant amount of Enterobacter and a few methicillin sensitive staph aureus. I empirically put him on Levaquin in response to this although we were not able to get a hold of him by phone to let them know that the antibiotic was at his pharmacy therefore he has not started it yet. I encouraged him to start today. The wound is larger, a lot of drainage. There is nothing that probes to bone however we are certainly not making any progress. He is attempting to modify his foot wear. 8/30; X-ray ORDERED last WEEK showed suggestion of osteomyelitis of the first tuft of the right great toe. I looked at this x-ray indeed it does look damaged. He reminds me that he has rods and screws in his left arm from remote trauma. He will not be a candidate for an MRI. Nevertheless I think it is important to be sure of the diagnosis here. The patient also has severe PAD. Amputation of parts of the total toe may not heal. He started Levaquin last week. Using silver alginate on the wound 9/13; we still do not have a CT scan. He is completing 2 weeks of Levaquin and 2 weeks of Flagyl. Most of his complaints of side effects I think are related to the Flagyl but that can stop as of when he finishes his medications either later today or tomorrow. He tells me that he has been using Neosporin on this for the last week, thinking that the silver alginate causes drainage. I do not know told him I thought this would be unlikely. It looks like he is offloading this better Objective Constitutional Patient is hypertensive.. Pulse regular and within target range for patient.Marland Kitchen Respirations regular, non-labored and within target range.. Temperature is normal and within the target range for the patient.Marland Kitchen Appears in no distress. Vitals Time Taken: 8:16 AM, Height: 71 in,  Weight: 152 lbs, BMI: 21.2, Temperature: 98 F, Pulse: 65 bpm, Respiratory Rate: 18 breaths/min, Blood Pressure: 146/69 mmHg. General Notes: Wound exam; right great toe plantar aspect in the distal phalanx. Wound actually looks somewhat better. Some surface debris on the wound but not much callus. He has rims of epithelialization. Overall the wound looks better. I used a #3 curette to remove the fibrinous surface debris. Hemostasis with direct pressure Integumentary (Hair, Skin) Wound #1 status is Open. Original cause of wound was Gradually Appeared. The wound is located on the Right T Great. The wound measures 1cm length x oe 1cm width x 0.1cm depth; 0.785cm^2 area and 0.079cm^3 volume. There is Fat Layer (Subcutaneous Tissue) exposed. There is no tunneling or undermining noted. There is a medium amount of serosanguineous drainage noted. The wound margin is flat and intact. There is medium (34-66%) pink  granulation within the wound bed. There is a medium (34-66%) amount of necrotic tissue within the wound bed including Adherent Slough. Assessment Active Problems ICD-10 Type 1 diabetes mellitus with foot ulcer Non-pressure chronic ulcer of other part of right foot with fat layer exposed Type 1 diabetes mellitus with diabetic peripheral angiopathy without gangrene Type 1 diabetes mellitus with diabetic polyneuropathy Other chronic osteomyelitis, right ankle and foot Procedures Wound #1 Pre-procedure diagnosis of Wound #1 is a Diabetic Wound/Ulcer of the Lower Extremity located on the Right T Great .Severity of Tissue Pre Debridement is: oe Fat layer exposed. There was a Excisional Skin/Subcutaneous Tissue Debridement with a total area of 1 sq cm performed by Maxwell Caul., MD. With the following instrument(s): Curette to remove Viable and Non-Viable tissue/material. Material removed includes Subcutaneous Tissue and Slough and. No specimens were taken. A time out was conducted at 08:55,  prior to the start of the procedure. A Minimum amount of bleeding was controlled with Pressure. The procedure was tolerated well with a pain level of 0 throughout and a pain level of 0 following the procedure. Post Debridement Measurements: 1cm length x 1cm width x 0.1cm depth; 0.079cm^3 volume. Character of Wound/Ulcer Post Debridement is improved. Severity of Tissue Post Debridement is: Fat layer exposed. Post procedure Diagnosis Wound #1: Same as Pre-Procedure Plan Follow-up Appointments: Return Appointment in 1 week. Dressing Change Frequency: Wound #1 Right T Great: oe Change dressing every day. Wound Cleansing: Wound #1 Right T Great: oe May shower and wash wound with soap and water. Primary Wound Dressing: Wound #1 Right T Great: oe Calcium Alginate with Silver Secondary Dressing: Wound #1 Right T Great: oe Foam - foam donut or felt callous pad Kerlix/Rolled Gauze Dry Gauze The following medication(s) was prescribed: Levaquin oral 500 mg tablet 1 tablet oral oral daily for a further 2 weeks (continuing rx) for wound infection o osteomyelitis starting 05/06/2020 1. I have asked him to go back to the silver alginate. If we are not effective with this perhaps Hydrofera Blue or polymen Ag 2. I am going to give him another 2 weeks of the Levaquin for the presumed underlying osteomyelitis. We still do not have the confirmatory CT scan. 3. I think the improvement in the wound has everything to do with more aggressive and improved offloading of this area. They have been keeping the inner phalangeal joint straighter in his shoes Electronic Signature(s) Signed: 05/06/2020 9:29:12 AM By: Baltazar Najjar MD Entered By: Baltazar Najjar on 05/06/2020 09:29:12 -------------------------------------------------------------------------------- SuperBill Details Patient Name: Date of Service: [name] 05/06/2020 Medical Record Number: [id number] Patient Account Number: [id number] Date  of Birth/Sex: Treating RN: 1951-02-08 (69 y.o. 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, right ankle and foot Facility Procedures CPT4 Code: [id number] E Description: 11042 - DEB SUBQ TISSUE 20 SQ CM/< ICD-10 Diagnosis Description L97.512 Non-pressure chronic ulcer of other part of right foot with fat layer exposed 10.621 Type 1 diabetes mellitus with foot ulcer Modifier: Quantity: 1 Physician Procedures : CPT4 Code Description Modifier [id number] 11042 - WC PHYS SUBQ TISS 20 SQ CM ICD-10 Diagnosis Description L97.512 Non-pressure chronic ulcer  of other part of right foot with fat layer exposed E10.621 Type 1 diabetes mellitus with foot ulcer Quantity: 1 Electronic Signature(s) Signed: 05/07/2020 5:12:45 PM By: Baltazar Najjar MD Entered By: Baltazar Najjar on 05/06/2020 16:10:96

## 2020-05-13 ENCOUNTER — Encounter (HOSPITAL_BASED_OUTPATIENT_CLINIC_OR_DEPARTMENT_OTHER): Payer: Medicare Other | Admitting: Internal Medicine

## 2020-05-13 NOTE — Progress Notes (Signed)
KDEN, WAGSTER ([id number]) Visit Report for 05/06/2020 Arrival Information Details Patient Name: Date of Service: [name], [name] 05/06/2020 8:00 A M Medical Record Number: [id number] Patient Account Number: [id number] Date of Birth/Sex: Treating RN: [name][birth year] (68 y.o. [name]) [name] Primary Care Adeoluwa Silvers: [name] Other Clinician: Referring Quintessa Simmerman: Treating Melvern Ramone/Extender: [name] in Treatment: 78 Visit Information History Since Last Visit All ordered tests and consults were completed: No Patient Arrived: Ambulatory Added or deleted any medications: No Arrival Time: 08:15 Any new allergies or adverse reactions: No Accompanied By: wife Had a fall or experienced change in No Transfer Assistance: None activities of daily living that may affect Patient Identification Verified: Yes risk of falls: Secondary Verification Process Completed: Yes Signs or symptoms of abuse/neglect since last visito No Patient Requires Transmission-Based Precautions: No Hospitalized since last visit: No Patient Has Alerts: Yes Implantable device outside of the clinic excluding No Patient Alerts: R ABI non compressible cellular tissue based products placed in the center since last visit: Has Dressing in Place as Prescribed: Yes Pain Present Now: No Electronic Signature(s) Signed: 05/13/2020 1:16:40 PM By: [name] RN Entered By: [name] on 05/06/2020 08:16:13 -------------------------------------------------------------------------------- Encounter Discharge Information Details Patient Name: Date of Service: [name]. 05/06/2020 8:00 A M Medical Record Number: [id number] Patient Account Number: [id number] Date of Birth/Sex: Treating RN: [birth year][name] (69 y.o. [name] Primary Care Shaquoya Cosper: [name] Other Clinician: Referring Lyda Colcord: Treating Landrum Carbonell/Extender: [name] in Treatment: 14 Encounter  Discharge Information Items Post Procedure Vitals Discharge Condition: Stable Temperature (F): 98 Ambulatory Status: Ambulatory Pulse (bpm): 65 Discharge Destination: Home Respiratory Rate (breaths/min): 18 Transportation: Private Auto Blood Pressure (mmHg): 146/69 Accompanied By: wife Schedule Follow-up Appointment: Yes Clinical Summary of Care: Patient Declined Electronic Signature(s) Signed: 05/06/2020 6:04:50 PM By: Kela Millin Entered By: Kela Millin on 05/06/2020 09:19:28 -------------------------------------------------------------------------------- Lower Extremity Assessment Details Patient Name: Date of Service: [name] 05/06/2020 8:00 A M Medical Record Number: [id number] Patient Account Number: [id number] Date of Birth/Sex: Treating RN: April [name], [name] (68 y.o. [name]) [name] Primary Care Zyliah Schier: [name] Other Clinician: Referring Mela Perham: Treating Lakindra Wible/Extender: [name] in Treatment: 14 Edema Assessment Assessed: Shirlyn Goltz: No] Patrice Paradise: No] Edema: [Left: Ye] [Right: s] Calf Left: Right: Point of Measurement: cm From Medial Instep cm 34 cm Ankle Left: Right: Point of Measurement: cm From Medial Instep cm [name] cm Electronic Signature(s) Signed: 05/13/2020 1:16:40 PM By: [name] RN Entered By: [name] on 05/06/2020 08:20:59 -------------------------------------------------------------------------------- Multi Wound Chart Details Patient Name: Date of Service: [name]. 05/06/2020 8:00 A M Medical Record Number: [id number] Patient Account Number: [id number] Date of Birth/Sex: Treating RN: [name], [name] (69 y.o. M) Primary Care Mustapha Colson: [name] Other Clinician: Referring Florette Thai: Treating Shanta Dorvil/Extender: [name] in Treatment: 14 Vital Signs Height(in): 72 Pulse(bpm): 41 Weight(lbs): 152 Blood Pressure(mmHg): 146/69 Body Mass Index(BMI):  21 Temperature(F): 98 Respiratory Rate(breaths/min): 18 Photos: [1:No Photos Right T Great oe] [N/A:N/A N/A] Wound Location: [1:Gradually Appeared] [N/A:N/A] Wounding Event: [1:Diabetic Wound/Ulcer of the Lower] [N/A:N/A] Primary Etiology: [1:Extremity Cataracts, Coronary Artery Disease,] [N/A:N/A] Comorbid History: [1:Peripheral Arterial Disease, Type I Diabetes, Osteomyelitis, Neuropathy 11/23/2019] [N/A:N/A] Date Acquired: [1:14] [N/A:N/A] Weeks of Treatment: [1:Open] [N/A:N/A] Wound Status: [1:1x1x0.1] [N/A:N/A] Measurements L x W x D (cm) [1:0.785] [N/A:N/A] A (cm) : rea [1:0.079] [N/A:N/A] Volume (cm) : [1:-565.30%] [N/A:N/A] % Reduction in A rea: [1:-558.30%] [N/A:N/A] % Reduction in Volume: [1:Grade 2] [N/A:N/A] Classification: [1:Medium] [N/A:N/A] Exudate A mount: [  1:Serosanguineous] [N/A:N/A] Exudate Type: [1:red, brown] [N/A:N/A] Exudate Color: [1:Flat and Intact] [N/A:N/A] Wound Margin: [1:Medium (34-66%)] [N/A:N/A] Granulation Amount: [1:Pink] [N/A:N/A] Granulation Quality: [1:Medium (34-66%)] [N/A:N/A] Necrotic Amount: [1:Fat Layer (Subcutaneous Tissue): Yes N/A] Exposed Structures: [1:Fascia: No Tendon: No Muscle: No Joint: No Bone: No None] [N/A:N/A] Epithelialization: [1:Debridement - Excisional] [N/A:N/A] Debridement: Pre-procedure Verification/Time Out 08:55 [N/A:N/A] Taken: [1:Subcutaneous, Slough] [N/A:N/A] Tissue Debrided: [1:Skin/Subcutaneous Tissue] [N/A:N/A] Level: [1:1] [N/A:N/A] Debridement A (sq cm): [1:rea Curette] [N/A:N/A] Instrument: [1:Minimum] [N/A:N/A] Bleeding: [1:Pressure] [N/A:N/A] Hemostasis A chieved: [1:0] [N/A:N/A] Procedural Pain: [1:0] [N/A:N/A] Post Procedural Pain: [1:Procedure was tolerated well] [N/A:N/A] Debridement Treatment Response: [1:1x1x0.1] [N/A:N/A] Post Debridement Measurements L x W x D (cm) [1:0.079] [N/A:N/A] Post Debridement Volume: (cm) [1:Debridement] [N/A:N/A] Treatment Notes Wound #1 (Right Toe  Great) 1. Cleanse With Wound Cleanser 2. Periwound Care Skin Prep 3. Primary Dressing Applied Calcium Alginate Ag 4. Secondary Dressing Dry Gauze Roll Gauze Foam 5. Secured With Tape 7. Footwear/Offloading device applied Felt/Foam Notes foam donut. netting Electronic Signature(s) Signed: 05/07/2020 5:12:45 PM By: Linton Ham MD Entered By: Linton Ham on 05/06/2020 09:[name]:01 -------------------------------------------------------------------------------- Multi-Disciplinary Care Plan Details Patient Name: Date of Service: [name]. 05/06/2020 8:00 A M Medical Record Number: [id number] Patient Account Number: [id number] Date of Birth/Sex: Treating RN: [birth year][name] (69 y.o. [name] Primary Care Jariya Reichow: [name] Other Clinician: Referring Quency Tober: Treating Naleyah Ohlinger/Extender: [name] in Treatment: 14 Active Inactive Wound/Skin Impairment Nursing Diagnoses: Knowledge deficit related to ulceration/compromised skin integrity Goals: Patient/caregiver will verbalize understanding of skin care regimen Date Initiated: 01/23/2020 Target Resolution Date: 05/24/2020 Goal Status: Active Ulcer/skin breakdown will have a volume reduction of 30% by week 4 Date Initiated: 01/23/2020 Date Inactivated: 03/05/2020 Target Resolution Date: 02/22/2020 Goal Status: Met Ulcer/skin breakdown will have a volume reduction of 50% by week 8 Date Initiated: 03/05/2020 Date Inactivated: 04/02/2020 Target Resolution Date: 03/24/2020 Goal Status: Unmet Unmet Reason: comorbities Ulcer/skin breakdown will have a volume reduction of 80% by week 12 Date Initiated: 04/02/2020 Date Inactivated: 04/22/2020 Target Resolution Date: 04/24/2020 Goal Status: Unmet Unmet Reason: Osteomyelitis Interventions: Assess patient/caregiver ability to obtain necessary supplies Assess patient/caregiver ability to perform ulcer/skin care regimen upon admission and as  needed Assess ulceration(s) every visit Notes: Electronic Signature(s) Signed: 05/08/2020 5:57:30 PM By: Levan Hurst RN, BSN Entered By: Levan Hurst on 05/06/2020 08:26:57 -------------------------------------------------------------------------------- Pain Assessment Details Patient Name: Date of Service: [name] 05/06/2020 8:00 A M Medical Record Number: [id number] Patient Account Number: [id number] Date of Birth/Sex: Treating RN: [birth year], [name] (68 y.o. [name] Primary Care Jacelyn Cuen: [name] Other Clinician: Referring Conlin Brahm: Treating Briany Aye/Extender: [name] in Treatment: 14 Active Problems Location of Pain Severity and Description of Pain Patient Has Paino No Site Locations Pain Management and Medication Current Pain Management: Electronic Signature(s) Signed: 05/13/2020 1:16:40 PM By: [name] RN Entered By: [name] on 05/06/2020 08:16:56 -------------------------------------------------------------------------------- Patient/Caregiver Education Details Patient Name: Date of Service: [name] 9/13/2021andnbsp8:00 [place] Record Number: [id number] Patient Account Number: [id number] Date of Birth/Gender: Treating RN: [name]/[birth year] (69 y.o. [name] Primary Care Physician: [name] Other Clinician: Referring Physician: Treating Physician/Extender: [name] in Treatment: 14 Education Assessment Education Provided To: Patient Education Topics Provided Wound/Skin Impairment: Methods: Explain/Verbal Responses: State content correctly [employer]) Signed: 05/08/2020 5:57:30 PM By: Levan Hurst RN, BSN Entered By: Levan Hurst on 05/06/2020 08:27:07 -------------------------------------------------------------------------------- Wound Assessment Details Patient Name: Date of Service: [name]. 05/06/2020 8:00 A M Medical Record  Number:  [id number] Patient Account Number: [id number] Date of Birth/Sex: Treating RN: [birth year] (68 y.o. [name]) [name] Primary Care Perri Lamagna: [name] Other Clinician: Referring Akina Maish: Treating Braedyn Kauk/Extender: [name] in Treatment: 14 Wound Status Wound Number: 1 Primary Diabetic Wound/Ulcer of the Lower Extremity Etiology: Wound Location: Right T Great oe Wound Open Wounding Event: Gradually Appeared Status: Date Acquired: 11/23/2019 Comorbid Cataracts, Coronary Artery Disease, Peripheral Arterial Disease, Weeks Of Treatment: 14 History: Type I Diabetes, Osteomyelitis, Neuropathy Clustered Wound: No Wound Measurements Length: (cm) 1 Width: (cm) 1 Depth: (cm) 0.1 Area: (cm) 0.785 Volume: (cm) 0.079 % Reduction in Area: -565.3% % Reduction in Volume: -558.3% Epithelialization: None Tunneling: No Undermining: No Wound Description Classification: Grade 2 Wound Margin: Flat and Intact Exudate Amount: Medium Exudate Type: Serosanguineous Exudate Color: red, brown Foul Odor After Cleansing: No Slough/Fibrino Yes Wound Bed Granulation Amount: Medium (34-66%) Exposed Structure Granulation Quality: Pink Fascia Exposed: No Necrotic Amount: Medium (34-66%) Fat Layer (Subcutaneous Tissue) Exposed: Yes Necrotic Quality: Adherent Slough Tendon Exposed: No Muscle Exposed: No Joint Exposed: No Bone Exposed: No Treatment Notes Wound #1 (Right Toe Great) 1. Cleanse With Wound Cleanser 2. Periwound Care Skin Prep 3. Primary Dressing Applied Calcium Alginate Ag 4. Secondary Dressing Dry Gauze Roll Gauze Foam 5. Secured With Tape 7. Footwear/Offloading device applied Felt/Foam Notes foam donut. netting Electronic Signature(s) Signed: 05/13/2020 1:16:40 PM By: [name] RN Entered By: [name] on 05/06/2020 08:22:56 -------------------------------------------------------------------------------- Vitals Details Patient  Name: Date of Service: [name]. 05/06/2020 8:00 A M Medical Record Number: [id number] Patient Account Number: [id number] Date of Birth/Sex: Treating RN: [birth year][name] (68 y.o. [name]) [name] Primary Care Venesha Petraitis: [name] Other Clinician: Referring Wreatha Sturgeon: Treating Tarnisha Kachmar/Extender: [name] in Treatment: 14 Vital Signs Time Taken: 08:16 Temperature (F): 98 Height (in): 71 Pulse (bpm): 65 Weight (lbs): 152 Respiratory Rate (breaths/min): 18 Body Mass Index (BMI): 21.2 Blood Pressure (mmHg): 146/69 Reference Range: 80 - 120 mg / dl Electronic Signature(s) Signed: 05/13/2020 1:16:40 PM By: [name] RN Entered By: [name] on 05/06/2020 08:16:49

## 2020-05-20 ENCOUNTER — Ambulatory Visit: Payer: Medicare Other | Admitting: Cardiology

## 2020-05-21 ENCOUNTER — Encounter (HOSPITAL_BASED_OUTPATIENT_CLINIC_OR_DEPARTMENT_OTHER): Payer: Medicare Other | Admitting: Internal Medicine

## 2020-05-21 DIAGNOSIS — E10621 Type 1 diabetes mellitus with foot ulcer: Secondary | ICD-10-CM | POA: Diagnosis not present

## 2020-05-21 NOTE — Progress Notes (Signed)
[name], [name] ([id number]) Visit Report for 05/21/2020 HPI Details Patient Name: Date of Service: [name], [name] 05/21/2020 8:00 A M Medical Record Number: [id number] Patient Account Number: [id number] Date of Birth/Sex: Treating RN: Oct 10, 1950 (68 y.o. 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 He is a type I diabetic on an insulin pump diagnosed at age 89. He also has known peripheral neuropathy. He has been dealing with a wound on his right first toe since at least February by review of epic. He was seen by Dr. Lajoyce Cornersuda. Diagnosed with an ischemic wound sent to see Dr. Myra GianottiBrabham him who noted a noncompressible ABI 1.67 and a TBI of 0.25 monophasic waveforms. On 11/15/2019 he underwent a right femoral to below-knee popliteal bypass with an ipsilateral saphenous vein. He also underwent extensive endarterectomy I believe that the external iliac. Postoperative ABI was 1.16 he has been using Neosporin on this and gradually making progress according to his companion. He is offloading this with a hole in the inserts in his work shoes. Per his companion who does the dressings the wound has been getting smaller Past medical history; type 1 diabetes with PAD and PN, history of a left BKA in 2017, coronary artery disease, hypercholesterolemia, hypertension he is a smoker 6/29; this is a patient I have not seen in 4 weeks. He is a type I diabetic on an insulin pump with a prior left BKA. He was revascularized before he came to our clinic with a right femoral to below-knee popliteal bypass with a saphenous vein. He also had an extensive endarterectomy. We used Hydrofera Blue to this wound. 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  Wears work boots. 7/13; type I diabetic with an area on the plantar tip of his right great toe in the setting of a previous left BKA and extensive endarterectomy with a right femoral to below-knee popliteal bypass before he came to our clinic. We have been using Hydrofera Blue the wound is measuring small. 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 Previous left BKA. He is also had an extensive endarterectomy with a right femoral to below-knee popliteal bypass before he came here. We have been using Hydrofera Blue. Nice improvements in the wound which is small and superficial now. [facility] I think he made this on his own but we have talked about this last week. He has a hammer deformity of the toe. I also wondered if they could be helpful in modifying his foot wear. 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 For the next 3 weeks he is traveling with his wife and therefore will not be outdoors as much on his farm. 8/17; patient returns to clinic today with a much bigger  wound than what I was used to seeing 3 weeks ago. I have looked through his arterial studies from 7/12. At that point he had noncompressible ABIs at 1.72 biphasic waveforms at the PTA and monophasic waveforms at the dorsalis pedis. Unfortunately they did not do a TBI on the great toe because it was bandaged. The interpretation however suggested waveforms showed adequate perfusion. The patient states that he saw Dr. Myra Gianotti about a month ago, I will see if  I can pull this record. As noted previously the patient is a very active man works with a left below-knee prosthesis. The great toe has a bit of a hammer deformity to it. He is trying to offload this using modified insoles in his shoes i.e. cutting out a part of this to lift the toe off the bottom of his shoe. 8/23; culture grew abundant amount of Enterobacter and a few methicillin sensitive staph aureus. I empirically put him on Levaquin in response to this although we were not able to get a hold of him by phone to let them know that the antibiotic was at his pharmacy therefore he has not started it yet. I encouraged him to start today. The wound is larger, a lot of drainage. There is nothing that probes to bone however we are certainly not making any progress. He is attempting to modify his foot wear. 8/30; X-ray ORDERED last WEEK showed suggestion of osteomyelitis of the first tuft of the right great toe. I looked at this x-ray indeed it does look damaged. He reminds me that he has rods and screws in his left arm from remote trauma. He will not be a candidate for an MRI. Nevertheless I think it is important to be sure of the diagnosis here. The patient also has severe PAD. Amputation of parts of the total toe may be necessary if this does not heal. He started Levaquin last week. Using silver alginate on the wound 9/13; we still do not have a CT scan. He is completing 2 weeks of Levaquin and 2 weeks of Flagyl. Most of his complaints of side effects I think are related to the Flagyl but that can stop as of when he finishes his medications either later today or tomorrow. He tells me that he has been using Neosporin on this for the last week, thinking that the silver alginate causes drainage. I do not know told him I thought this would be unlikely. It looks like he is offloading this better 9/28. Very small improvement in measurements. Surface looks reasonably healthy. He has finished 4 weeks of  Levaquin and 2 weeks of Flagyl. He has a CT scan on October 5. Change the dressing to [facility] today Electronic Signature(s) Signed: 05/21/2020 5:19:28 PM By: Baltazar Najjar MD Entered By: Baltazar Najjar on 05/21/2020 08:46:04 -------------------------------------------------------------------------------- Physical Exam Details Patient Name: Date of Service: [name] Boston 05/21/2020 8:00 A M Medical Record Number: [id number] Patient Account Number: [id number] Date of Birth/Sex: Treating RN: 06-05-1951 (68 y.o. [name] Primary Care Provider: [name] Other Clinician: Referring Provider: Treating Provider/Extender: Lacretia Leigh in Treatment: 17 Constitutional Patient is hypertensive.. Pulse regular and within target range for patient.Marland Kitchen Respirations regular, non-labored and within target range.. Temperature is normal and within the target range for the patient.Marland Kitchen Appears in no distress. Notes Wound exam; right great plantar toe distal phalanx. Wound has some debris under direct illumination but no mechanical debridement. The surface otherwise looks healthy. Slightly smaller in measurements today. There  is no depth of this today no significant undermining Electronic Signature(s) Signed: 05/21/2020 5:19:28 PM By: Baltazar Najjar MD Entered By: Baltazar Najjar on 05/21/2020 08:47:50 -------------------------------------------------------------------------------- Physician Orders Details Patient Name: Date of Service: [name] Boston. 05/21/2020 8:00 A M Medical Record Number: [id number] Patient Account Number: [id number] Date of Birth/Sex: Treating RN: 07/13/1951 (68 y.o. 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 Dressing Change Frequency Wound #1 Right T Great oe Change dressing every day. Wound Cleansing Wound #1 Right T Great oe May shower and wash wound with soap and water. Primary Wound Dressing Wound #1 Right T Great oe Hydrofera Blue - classic Secondary Dressing Wound #1 Right T Great oe Foam - foam donut or felt callous pad Kerlix/Rolled Gauze Dry Gauze Laboratory Basic metabolic 2000 panel in Serum or Plasma (CHEM-panel) - pre CT scan - (ICD10 E10.621 - Type 1 diabetes mellitus with foot ulcer) LOINC Code: 40981-1 Convenience Name: Basic Metabolic panelCMS Patient Medications llergies: No Known Allergies A Notifications Medication Indication Start End osteomyelits 05/21/2020 Levaquin DOSE oral 500 mg tablet - 1 tablet oral for a further 2 weeks (continuing rx) Electronic Signature(s) Signed: 05/21/2020 8:52:18 AM By: Baltazar Najjar MD Entered By: Baltazar Najjar on 05/21/2020 08:52:17 Prescription 05/21/2020 -------------------------------------------------------------------------------- Arville Care MD Patient Name: Provider: December 10, 1950 [id number] Date of Birth: NPI#[place] Sex: DEA #: [phone] [id number] Phone #: License #: Eligha Bridegroom [facility] Patient Address: [address] [place], [place] [address] [address] [place], [place] [address] [phone] Allergies No Known Allergies Provider's Orders Basic metabolic 2000 panel in Serum or Plasma - ICD10: E10.621 - pre CT scan LOINC Code: 24321-2 Convenience Name: Basic  Metabolic panelCMS Hand Signature: Date(s): Electronic Signature(s) Signed: 05/21/2020 5:19:28 PM By: Baltazar Najjar MD Entered By: Baltazar Najjar on 05/21/2020 08:52:19 -------------------------------------------------------------------------------- Problem List Details Patient Name: Date of Service: [name] Boston 05/21/2020 8:00 A M Medical Record Number: [id number] Patient Account Number: [id number] Date of Birth/Sex: Treating RN: Sep 06, 1950 (68 y.o. 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 He  is a type I diabetic on an insulin pump diagnosed at age 80. He also has known peripheral neuropathy. He has been dealing with a wound on his right first toe since at least February by review of epic. He was seen by Dr. Lajoyce Corners. Diagnosed with an ischemic wound sent to see Dr. Myra Gianotti him who noted a noncompressible ABI 1.67 and a TBI of 0.25 monophasic waveforms. On 11/15/2019 he underwent a right femoral to below-knee popliteal bypass with an ipsilateral saphenous vein. He also underwent extensive endarterectomy I believe that the external iliac. Postoperative ABI was 1.16 he has been using Neosporin on this and gradually making progress according to his companion. He is offloading this with a hole in the inserts in his work shoes. Per his companion who does the dressings the wound has been getting smaller Past medical history; type 1 diabetes with PAD and PN, history of a left BKA in 2017, coronary artery disease, hypercholesterolemia, hypertension he is a smoker 6/29; this is a patient I have not seen in 4 weeks. He is a type I diabetic on an insulin pump with a prior left BKA. He was revascularized before he came to our clinic with a right femoral to below-knee popliteal bypass with a saphenous vein. He also had an extensive endarterectomy. We used Hydrofera Blue to this wound. 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 Wears work boots. 7/13; type I diabetic with an area on the plantar tip of his right great toe in the setting of a previous left BKA and extensive endarterectomy with a right femoral to below-knee popliteal bypass before he came to our clinic. We have been using Hydrofera Blue the wound is measuring small. 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 Previous left BKA. He is also had an extensive endarterectomy with a right femoral to below-knee popliteal  bypass before he came here. We have been using Hydrofera Blue. Nice improvements in the wound which is small and superficial now. [facility] I think he made this on his own but we have talked about this last week. He has a hammer deformity of the toe. I also wondered if they could be helpful in modifying his foot wear. 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 For the next 3 weeks he is traveling with his wife and therefore will not be outdoors as much on his farm. 8/17; patient returns to clinic today with a much bigger wound than what I was used to seeing 3 weeks ago. I have looked through his arterial studies from 7/12. At that point he had noncompressible ABIs at 1.72 biphasic waveforms at the PTA and monophasic waveforms at the dorsalis pedis. Unfortunately they did not do a TBI on the great toe because it was bandaged. The interpretation  however suggested waveforms showed adequate perfusion. The patient states that he saw Dr. Myra Gianotti about a month ago, I will see if I can pull this record. As noted previously the patient is a very active man works with a left below-knee prosthesis. The great toe has a bit of a hammer deformity to it. He is trying to offload this using modified insoles in his shoes i.e. cutting out a part of this to lift the toe off the bottom of his shoe. 8/23; culture grew abundant amount of Enterobacter and a few methicillin sensitive staph aureus. I empirically put him on Levaquin in response to this although we were not able to get a hold of him by phone to let them know that the antibiotic was at his pharmacy therefore he has not  started it yet. I encouraged him to start today. The wound is larger, a lot of drainage. There is nothing that probes to bone however we are certainly not making any progress. He is attempting to modify his foot wear. 8/30; X-ray ORDERED last WEEK showed suggestion of osteomyelitis of the first tuft of the right great toe. I looked at this x-ray indeed it does look damaged. He reminds me that he has rods and screws in his left arm from remote trauma. He will not be a candidate for an MRI. Nevertheless I think it is important to be sure of the diagnosis here. The patient also has severe PAD. Amputation of parts of the total toe may be necessary if this does not heal. He started Levaquin last week. Using silver alginate on the wound 9/13; we still do not have a CT scan. He is completing 2 weeks of Levaquin and 2 weeks of Flagyl. Most of his complaints of side effects I think are related to the Flagyl but that can stop as of when he finishes his medications either later today or tomorrow. He tells me that he has been using Neosporin on this for the last week, thinking that the silver alginate causes drainage. I do not know told him I thought this would be unlikely. It looks like he is offloading this better 9/28. Very small improvement in measurements. Surface looks reasonably healthy. He has finished 4 weeks of Levaquin and 2 weeks of Flagyl. He has a CT scan on October 5. Change the dressing to Hydrofera Blue today Objective Constitutional Patient is hypertensive.. Pulse regular and within target range for patient.Marland Kitchen Respirations regular, non-labored and within target range.. Temperature is normal and within the target range for the patient.Marland Kitchen Appears in no distress. Vitals Time Taken: 8:24 AM, Height: 71 in, Source: Stated, Weight: 152 lbs, Source: Stated, BMI: 21.2, Temperature: 97.8 F, Pulse: 57 bpm, Respiratory Rate: 18 breaths/min, Blood Pressure: 151/71 mmHg. General Notes: Wound exam; right  great plantar toe distal phalanx. Wound has some debris under direct illumination but no mechanical debridement. The surface otherwise looks healthy. Slightly smaller in measurements today. There is no depth of this today no significant undermining Integumentary (Hair, Skin) Wound #1 status is Open. Original cause of wound was Gradually Appeared. The wound is located on the Right T Great. The wound measures 0.8cm length x oe 1cm width x 0.1cm depth; 0.628cm^2 area and 0.063cm^3 volume. There is Fat Layer (Subcutaneous Tissue) exposed. There is no tunneling or undermining noted. There is a medium amount of serosanguineous drainage noted. The wound margin is flat and intact. There is medium (34-66%) red granulation within the wound bed. There is a medium (34-66%) amount  of necrotic tissue within the wound bed including Adherent Slough. Assessment Active Problems ICD-10 Type 1 diabetes mellitus with foot ulcer Non-pressure chronic ulcer of other part of right foot with fat layer exposed Type 1 diabetes mellitus with diabetic peripheral angiopathy without gangrene Type 1 diabetes mellitus with diabetic polyneuropathy Other chronic osteomyelitis, right ankle and foot Plan Follow-up Appointments: Return Appointment in 1 week. Dressing Change Frequency: Wound #1 Right T Great: oe Change dressing every day. Wound Cleansing: Wound #1 Right T Great: oe May shower and wash wound with soap and water. Primary Wound Dressing: Wound #1 Right T Great: oe Hydrofera Blue - classic Secondary Dressing: Wound #1 Right T Great: oe Foam - foam donut or felt callous pad Kerlix/Rolled Gauze Dry Gauze Laboratory ordered were: Basic Metabolic panel CMS - pre CT scan The following medication(s) was prescribed: Levaquin oral 500 mg tablet 1 tablet oral for a further 2 weeks (continuing rx) for osteomyelits starting 05/21/2020 1. I am going to give him an extra 2 weeks of Levaquin which should finish this  off 6 weeks. 2 weeks of Flagyl 2. Changing him to [facility] today 3. CT scan of the foot. I am not really sure how we got to this. I had some thoughts about canceling this since the delay we have had has gotten to the point where we have already given him 6 weeks of treatment 4. 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

## 2020-05-21 NOTE — Progress Notes (Signed)
[name] ([id number]) Visit Report for 05/21/2020 Arrival Information Details Patient Name: Date of Service: [name], [name] 05/21/2020 8:00 A M Medical Record Number: [id number] Patient Account Number: [id number] Date of Birth/Sex: Treating RN: [birth year] (69 y.o. [name] Primary Care [name]: [name] Other Clinician: Referring [name]: Treating [name]: [name] in Treatment: 70 Visit Information History Since Last Visit Added or deleted any medications: No Patient Arrived: Ambulatory Any new allergies or adverse reactions: No Arrival Time: 08:20 Had a fall or experienced change in No Accompanied By: spouse activities of daily living that may affect Transfer Assistance: None risk of falls: Patient Identification Verified: Yes Signs or symptoms of abuse/neglect since last visito No Secondary Verification Process Completed: Yes Hospitalized since last visit: No Patient Requires Transmission-Based Precautions: No Implantable device outside of the clinic excluding No Patient Has Alerts: Yes cellular tissue based products placed in the center Patient Alerts: R ABI non compressible since last visit: Has Dressing in Place as Prescribed: Yes Pain Present Now: No Electronic Signature(s) Signed: 05/21/2020 5:46:00 PM By: Baruch Gouty RN, BSN Entered By: Baruch Gouty on 05/21/2020 08:21:05 -------------------------------------------------------------------------------- Clinic Level of Care Assessment Details Patient Name: Date of Service: [name] Leash. 05/21/2020 8:00 A M Medical Record Number: [id number] Patient Account Number: [id number] Date of Birth/Sex: Treating RN: 11/16/1950 (68 y.o. [name]) [name] Primary Care [name]: [name] Other Clinician: Referring Lakyla Biswas: Treating [name]: [name] in Treatment: 17 Clinic Level of Care Assessment Items TOOL 4 Quantity  Score X- 1 0 Use when only an EandM is performed on FOLLOW-UP visit ASSESSMENTS - Nursing Assessment / Reassessment X- 1 10 Reassessment of Co-morbidities (includes updates in patient status) X- 1 5 Reassessment of Adherence to Treatment Plan ASSESSMENTS - Wound and Skin A ssessment / Reassessment X - Simple Wound Assessment / Reassessment - one wound 1 5 _0  - 0 Complex Wound Assessment / Reassessment - multiple wounds _1  - 0 Dermatologic / Skin Assessment (not related to wound area) ASSESSMENTS - Focused Assessment _2  - 0 Circumferential Edema Measurements - multi extremities _3  - 0 Nutritional Assessment / Counseling / Intervention _4  - 0 Lower Extremity Assessment (monofilament, tuning fork, pulses) _5  - 0 Peripheral Arterial Disease Assessment (using hand held doppler) ASSESSMENTS - Ostomy and/or Continence Assessment and Care _6  - 0 Incontinence Assessment and Management _7  - 0 Ostomy Care Assessment and Management (repouching, etc.) PROCESS - Coordination of Care X - Simple Patient / Family Education for ongoing care 1 15 _8  - 0 Complex (extensive) Patient / Family Education for ongoing care X- 1 10 Staff obtains Programmer, systems, Records, T Results / Process Orders est _9  - 0 Staff telephones HHA, Nursing Homes / Clarify orders / etc _10  - 0 Routine Transfer to another Facility (non-emergent condition) _11  - 0 Routine Hospital Admission (non-emergent condition) _12  - 0 New Admissions / Biomedical engineer / Ordering NPWT Apligraf, etc. , _13  - 0 Emergency Hospital Admission (emergent condition) X- 1 10 Simple Discharge Coordination _14  - 0 Complex (extensive) Discharge Coordination PROCESS - Special Needs _15  - 0 Pediatric / Minor Patient Management _16  - 0 Isolation Patient Management _17  - 0 Hearing / Language / Visual special needs _18  - 0 Assessment of Community assistance (transportation, D/C planning, etc.) _19  - 0 Additional assistance / Altered  mentation _20  - 0 Support Surface(s) Assessment (bed, cushion, seat, etc.) INTERVENTIONS - Wound Cleansing / Measurement X - Simple Wound Cleansing - one wound 1 5 _21  - 0  Complex Wound Cleansing - multiple wounds X- 1 5 Wound Imaging (photographs - any number of wounds) _0  - 0 Wound Tracing (instead of photographs) X- 1 5 Simple Wound Measurement - one wound _1  - 0 Complex Wound Measurement - multiple wounds INTERVENTIONS - Wound Dressings _2  - 0 Small Wound Dressing one or multiple wounds X- 1 15 Medium Wound Dressing one or multiple wounds _3  - 0 Large Wound Dressing one or multiple wounds X- 1 5 Application of Medications - topical <[id number]>_1<\/[id number]>_4  - 0 Application of Medications - injection INTERVENTIONS - Miscellaneous _5  - 0 External ear exam _6  - 0 Specimen Collection (cultures, biopsies, blood, body fluids, etc.) _7  - 0 Specimen(s) / Culture(s) sent or taken to Lab for analysis _8  - 0 Patient Transfer (multiple staff / Civil Service fast streamer / Similar devices) _9  - 0 Simple Staple / Suture removal (25 or less) _10  - 0 Complex Staple / Suture removal (26 or more) _11  - 0 Hypo / Hyperglycemic Management (close monitor of Blood Glucose) _12  - 0 Ankle / Brachial Index (ABI) - do not check if billed separately X- 1 5 Vital Signs Has the patient been seen at the hospital within the last three years: Yes Total Score: 95 Level Of Care: New/Established - Level 3 Electronic Signature(s) Signed: 05/21/2020 5:34:55 PM By: [name] RN Entered By: [name] on 05/21/2020 08:39:45 -------------------------------------------------------------------------------- Encounter Discharge Information Details Patient Name: Date of Service: [name] Leash. 05/21/2020 8:00 A M Medical Record Number: [id number] Patient Account Number: [id number] Date of Birth/Sex: Treating RN: 05-05-1951 (69 y.o. [name] Primary Care Natanya Holecek: [name] Other Clinician: Referring Iyahna Obriant: Treating  Zala Degrasse/Extender: [name] in Treatment: 17 Encounter Discharge Information Items Discharge Condition: Stable Ambulatory Status: Ambulatory Discharge Destination: Home Transportation: Private Auto Accompanied By: wife Schedule Follow-up Appointment: Yes Clinical Summary of Care: Patient Declined Electronic Signature(s) Signed: 05/21/2020 5:04:51 PM By: Kela Millin Entered By: Kela Millin on 05/21/2020 08:52:46 -------------------------------------------------------------------------------- Lower Extremity Assessment Details Patient Name: Date of Service: [name], [name] 05/21/2020 8:00 A M Medical Record Number: [id number] Patient Account Number: [id number] Date of Birth/Sex: Treating RN: Sep 17, 1950 (69 y.o. [name] Primary Care Jobe Mutch: [name] Other Clinician: Referring Meagan Ancona: Treating Ryley Bachtel/Extender: [name] in Treatment: 17 Edema Assessment Assessed: Shirlyn Goltz: No] Patrice Paradise: No] Edema: [Left: N] [Right: o] Calf Left: Right: Point of Measurement: From Medial Instep 34 cm Ankle Left: Right: Point of Measurement: From Medial Instep 23 cm Vascular Assessment Pulses: Dorsalis Pedis Palpable: [Right:Yes] Electronic Signature(s) Signed: 05/21/2020 5:46:00 PM By: Baruch Gouty RN, BSN Entered By: Baruch Gouty on 05/21/2020 08:25:09 -------------------------------------------------------------------------------- Multi Wound Chart Details Patient Name: Date of Service: [name] Leash. 05/21/2020 8:00 A M Medical Record Number: [id number] Patient Account Number: [id number] Date of Birth/Sex: Treating RN: Mar 24, 1951 (68 y.o. [name]) [name] Primary Care Easten Maceachern: [name] Other Clinician: Referring Keylee Shrestha: Treating Verdia Bolt/Extender: [name] in Treatment: 17 Vital Signs Height(in): 93 Pulse(bpm): 66 Weight(lbs): 152 Blood Pressure(mmHg): 151/71 Body  Mass Index(BMI): 21 Temperature(F): 97.8 Respiratory Rate(breaths/min): 18 Photos: [1:No Photos Right T Great oe] [N/A:N/A N/A] Wound Location: [1:Gradually Appeared] [N/A:N/A] Wounding Event: [1:Diabetic Wound/Ulcer of the Lower] [N/A:N/A] Primary Etiology: [1:Extremity Cataracts, Coronary Artery Disease, N/A] Comorbid History: [1:Peripheral Arterial Disease, Type I Diabetes, Osteomyelitis, Neuropathy 11/23/2019] [N/A:N/A] Date Acquired: [1:17] [N/A:N/A] Weeks of Treatment: [1:Open] [N/A:N/A] Wound Status: [1:0.8x1x0.1] [N/A:N/A] Measurements L x W x D (cm) [1:0.628] [N/A:N/A] A (cm) : rea [1:0.063] [N/A:N/A] Volume (cm) : [1:-432.20%] [N/A:N/A] %  Reduction in A rea: [1:-425.00%] [N/A:N/A] % Reduction in Volume: [1:Grade 2] [N/A:N/A] Classification: [1:Medium] [N/A:N/A] Exudate A mount: [1:Serosanguineous] [N/A:N/A] Exudate Type: [1:red, brown] [N/A:N/A] Exudate Color: [1:Flat and Intact] [N/A:N/A] Wound Margin: [1:Medium (34-66%)] [N/A:N/A] Granulation A mount: [1:Red] [N/A:N/A] Granulation Quality: [1:Medium (34-66%)] [N/A:N/A] Necrotic A mount: [1:Fat Layer (Subcutaneous Tissue): Yes N/A] Exposed Structures: [1:Fascia: No Tendon: No Muscle: No Joint: No Bone: No Small (1-33%)] [N/A:N/A] Treatment Notes Electronic Signature(s) Signed: 05/21/2020 5:19:28 PM By: Linton Ham MD Signed: 05/21/2020 5:34:55 PM By: [name] RN Entered By: Linton Ham on 05/21/2020 08:43:39 -------------------------------------------------------------------------------- Multi-Disciplinary Care Plan Details Patient Name: Date of Service: [name] Leash. 05/21/2020 8:00 A M Medical Record Number: [id number] Patient Account Number: [id number] Date of Birth/Sex: Treating RN: 04/01/1951 (68 y.o. Oval Linsey Primary Care Avelino Herren: [name] Other Clinician: Referring Tyronica Truxillo: Treating Brinlyn Cena/Extender: [name] in Treatment: 17 Active  Inactive Wound/Skin Impairment Nursing Diagnoses: Knowledge deficit related to ulceration/compromised skin integrity Goals: Patient/caregiver will verbalize understanding of skin care regimen Date Initiated: 01/23/2020 Target Resolution Date: 05/24/2020 Goal Status: Active Ulcer/skin breakdown will have a volume reduction of 30% by week 4 Date Initiated: 01/23/2020 Date Inactivated: 03/05/2020 Target Resolution Date: 02/22/2020 Goal Status: Met Ulcer/skin breakdown will have a volume reduction of 50% by week 8 Date Initiated: 03/05/2020 Date Inactivated: 04/02/2020 Target Resolution Date: 03/24/2020 Goal Status: Unmet Unmet Reason: comorbities Ulcer/skin breakdown will have a volume reduction of 80% by week 12 Date Initiated: 04/02/2020 Date Inactivated: 04/22/2020 Target Resolution Date: 04/24/2020 Goal Status: Unmet Unmet Reason: Osteomyelitis Interventions: Assess patient/caregiver ability to obtain necessary supplies Assess patient/caregiver ability to perform ulcer/skin care regimen upon admission and as needed Assess ulceration(s) every visit Notes: Electronic Signature(s) Signed: 05/21/2020 5:34:55 PM By: [name] RN Entered By: [name] on 05/21/2020 08:15:53 -------------------------------------------------------------------------------- Pain Assessment Details Patient Name: Date of Service: [name], [name] 05/21/2020 8:00 A M Medical Record Number: [id number] Patient Account Number: [id number] Date of Birth/Sex: Treating RN: 10/26/1950 (69 y.o. [name] Primary Care Chayden Garrelts: [name] Other Clinician: Referring Mateusz Neilan: Treating Rochella Benner/Extender: [name] in Treatment: 17 Active Problems Location of Pain Severity and Description of Pain Patient Has Paino No Site Locations Rate the pain. Rate the pain. Current Pain Level: 0 Pain Management and Medication Current Pain Management: Electronic Signature(s) Signed:  05/21/2020 5:46:00 PM By: Baruch Gouty RN, BSN Entered By: Baruch Gouty on 05/21/2020 08:24:49 -------------------------------------------------------------------------------- Patient/Caregiver Education Details Patient Name: Date of Service: [name] Leash 9/28/2021andnbsp8:00 [place] Record Number: [id number] Patient Account Number: [id number] Date of Birth/Gender: Treating RN: April 02, 1951 (68 y.o. [name]) [name] Primary Care Physician: [name] Other Clinician: Referring Physician: Treating Physician/Extender: [name] in Treatment: 35 Education Assessment Education Provided To: Patient Education Topics Provided Wound/Skin Impairment: Methods: Explain/Verbal Responses: State content correctly Electronic Signature(s) Signed: 05/21/2020 5:34:55 PM By: [name] RN Entered By: [name] on 05/21/2020 08:16:21 -------------------------------------------------------------------------------- Wound Assessment Details Patient Name: Date of Service: [name] Leash 05/21/2020 8:00 A M Medical Record Number: [id number] Patient Account Number: [id number] Date of Birth/Sex: Treating RN: 1951/02/04 (69 y.o. [name] Primary Care Emberley Kral: [name] Other Clinician: Referring Otilia Kareem: Treating Israa Caban/Extender: [name] in Treatment: 17 Wound Status Wound Number: 1 Primary Diabetic Wound/Ulcer of the Lower Extremity Etiology: Wound Location: Right T Great oe Wound Open Wounding Event: Gradually Appeared Status: Date Acquired: 11/23/2019 Comorbid Cataracts, Coronary Artery Disease, Peripheral Arterial Disease, Weeks Of Treatment: 17 History: Type  I Diabetes, Osteomyelitis, Neuropathy Clustered Wound: No Wound Measurements Length: (cm) 0.8 Width: (cm) 1 Depth: (cm) 0.1 Area: (cm) 0.628 Volume: (cm) 0.063 % Reduction in Area: -432.2% % Reduction in Volume: -425% Epithelialization: Small  (1-33%) Tunneling: No Undermining: No Wound Description Classification: Grade 2 Wound Margin: Flat and Intact Exudate Amount: Medium Exudate Type: Serosanguineous Exudate Color: red, brown Foul Odor After Cleansing: No Slough/Fibrino Yes Wound Bed Granulation Amount: Medium (34-66%) Exposed Structure Granulation Quality: Red Fascia Exposed: No Necrotic Amount: Medium (34-66%) Fat Layer (Subcutaneous Tissue) Exposed: Yes Necrotic Quality: Adherent Slough Tendon Exposed: No Muscle Exposed: No Joint Exposed: No Bone Exposed: No Treatment Notes Wound #1 (Right Toe Great) 1. Cleanse With Wound Cleanser 2. Periwound Care Skin Prep 3. Primary Dressing Applied Hydrofera Blue 4. Secondary Dressing Dry Gauze Roll Gauze 5. Secured With Tape 7. Footwear/Offloading device applied Felt/Foam Notes netting Electronic Signature(s) Signed: 05/21/2020 5:46:00 PM By: Baruch Gouty RN, BSN Entered By: Baruch Gouty on 05/21/2020 08:26:06 -------------------------------------------------------------------------------- [place] Details Patient Name: Date of Service: [name] Leash. 05/21/2020 8:00 A M Medical Record Number: [id number] Patient Account Number: [id number] Date of Birth/Sex: Treating RN: 1951-08-11 (69 y.o. [name] Primary Care Khris Jansson: [name] Other Clinician: Referring Brentlee Sciara: Treating Zenita Kister/Extender: [name] in Treatment: 17 Vital Signs Time Taken: 08:24 Temperature (F): 97.8 Height (in): 71 Pulse (bpm): 57 Source: Stated Respiratory Rate (breaths/min): 18 Weight (lbs): 152 Blood Pressure (mmHg): 151/71 Source: Stated Reference Range: 80 - 120 mg / dl Body Mass Index (BMI): 21.2 Electronic Signature(s) Signed: 05/21/2020 5:46:00 PM By: Baruch Gouty RN, BSN Entered By: Baruch Gouty on 05/21/2020 08:24:38

## 2020-05-24 ENCOUNTER — Other Ambulatory Visit (HOSPITAL_COMMUNITY)
Admission: RE | Admit: 2020-05-24 | Discharge: 2020-05-24 | Disposition: A | Discharge status: Home / Self Care | Payer: Medicare Other | Source: Ambulatory Visit | Attending: Internal Medicine | Admitting: Internal Medicine

## 2020-05-24 DIAGNOSIS — E10621 Type 1 diabetes mellitus with foot ulcer: Secondary | ICD-10-CM | POA: Insufficient documentation

## 2020-05-24 LAB — BASIC METABOLIC PANEL
Anion gap: 12 (ref 5–15)
BUN: 20 mg/dL (ref 8–23)
CO2: 25 mmol/L (ref 22–32)
Calcium: 9.4 mg/dL (ref 8.9–10.3)
Chloride: 98 mmol/L (ref 98–111)
Creatinine, Ser: 0.9 mg/dL (ref 0.61–1.24)
GFR calc Af Amer: >60 mL/min (ref >60)
GFR calc non Af Amer: >60 mL/min (ref >60)
Glucose, Bld: 161 mg/dL — ABNORMAL HIGH (ref 70–99)
Potassium: 4.7 mmol/L (ref 3.5–5.1)
Sodium: 135 mmol/L (ref 135–145)

## 2020-05-27 ENCOUNTER — Encounter (HOSPITAL_BASED_OUTPATIENT_CLINIC_OR_DEPARTMENT_OTHER): Payer: Medicare Other | Attending: Internal Medicine | Admitting: Internal Medicine

## 2020-05-27 DIAGNOSIS — E1069 Type 1 diabetes mellitus with other specified complication: Secondary | ICD-10-CM | POA: Insufficient documentation

## 2020-05-27 DIAGNOSIS — M86671 Other chronic osteomyelitis, right ankle and foot: Secondary | ICD-10-CM | POA: Insufficient documentation

## 2020-05-27 DIAGNOSIS — E1051 Type 1 diabetes mellitus with diabetic peripheral angiopathy without gangrene: Secondary | ICD-10-CM | POA: Insufficient documentation

## 2020-05-27 DIAGNOSIS — E10621 Type 1 diabetes mellitus with foot ulcer: Secondary | ICD-10-CM | POA: Insufficient documentation

## 2020-05-27 DIAGNOSIS — Z9641 Presence of insulin pump (external) (internal): Secondary | ICD-10-CM | POA: Diagnosis not present

## 2020-05-27 DIAGNOSIS — Z89512 Acquired absence of left leg below knee: Secondary | ICD-10-CM | POA: Diagnosis not present

## 2020-05-27 DIAGNOSIS — Z794 Long term (current) use of insulin: Secondary | ICD-10-CM | POA: Diagnosis not present

## 2020-05-27 DIAGNOSIS — L97512 Non-pressure chronic ulcer of other part of right foot with fat layer exposed: Secondary | ICD-10-CM | POA: Diagnosis not present

## 2020-05-27 DIAGNOSIS — E1042 Type 1 diabetes mellitus with diabetic polyneuropathy: Secondary | ICD-10-CM | POA: Diagnosis not present

## 2020-05-27 NOTE — Progress Notes (Addendum)
ANTONINO, NIENHUIS ([id number]) Visit Report for 05/27/2020 Arrival Information Details Patient Name: Date of Service: JADAVION, SPOELSTRA 05/27/2020 8:00 A M Medical Record Number: [id number] Patient Account Number: [id number] Date of Birth/Sex: Treating RN: [birth year] (69 y.o. [name] Primary Care Riad Wagley: [name] Other Clinician: Referring Criselda Starke: Treating Aliea Bobe/Extender: [name] in Treatment: 80 Visit Information History Since Last Visit Added or deleted any medications: No Patient Arrived: Ambulatory Any new allergies or adverse reactions: No Arrival Time: 08:00 Had a fall or experienced change in No Accompanied By: wife activities of daily living that may affect Transfer Assistance: None risk of falls: Patient Identification Verified: Yes Signs or symptoms of abuse/neglect since last visito No Secondary Verification Process Completed: Yes Hospitalized since last visit: No Patient Requires Transmission-Based Precautions: No Implantable device outside of the clinic excluding No Patient Has Alerts: Yes cellular tissue based products placed in the center Patient Alerts: R ABI non compressible since last visit: Has Dressing in Place as Prescribed: Yes Pain Present Now: No Electronic Signature(s) Signed: 05/27/2020 5:08:20 PM By: Kela Millin Entered By: Kela Millin on 05/27/2020 08:01:10 -------------------------------------------------------------------------------- Clinic Level of Care Assessment Details Patient Name: Date of Service: KERRION, KEMPPAINEN 05/27/2020 8:00 A M Medical Record Number: [id number] Patient Account Number: [id number] Date of Birth/Sex: Treating RN: 29-Dec-1950 (69 y.o. Ernestene Mention Primary Care Christia Domke: [name] Other Clinician: Referring Booker Bhatnagar: Treating Amali Uhls/Extender: [name] in Treatment: 17 Clinic Level of Care Assessment Items TOOL 4 Quantity  Score []  - 0 Use when only an EandM is performed on FOLLOW-UP visit ASSESSMENTS - Nursing Assessment / Reassessment X- 1 10 Reassessment of Co-morbidities (includes updates in patient status) X- 1 5 Reassessment of Adherence to Treatment Plan ASSESSMENTS - Wound and Skin A ssessment / Reassessment X - Simple Wound Assessment / Reassessment - one wound 1 5 []  - 0 Complex Wound Assessment / Reassessment - multiple wounds []  - 0 Dermatologic / Skin Assessment (not related to wound area) ASSESSMENTS - Focused Assessment []  - 0 Circumferential Edema Measurements - multi extremities []  - 0 Nutritional Assessment / Counseling / Intervention X- 1 5 Lower Extremity Assessment (monofilament, tuning fork, pulses) []  - 0 Peripheral Arterial Disease Assessment (using hand held doppler) ASSESSMENTS - Ostomy and/or Continence Assessment and Care []  - 0 Incontinence Assessment and Management []  - 0 Ostomy Care Assessment and Management (repouching, etc.) PROCESS - Coordination of Care X - Simple Patient / Family Education for ongoing care 1 15 []  - 0 Complex (extensive) Patient / Family Education for ongoing care X- 1 10 Staff obtains Programmer, systems, Records, T Results / Process Orders est []  - 0 Staff telephones HHA, Nursing Homes / Clarify orders / etc []  - 0 Routine Transfer to another Facility (non-emergent condition) []  - 0 Routine Hospital Admission (non-emergent condition) []  - 0 New Admissions / Biomedical engineer / Ordering NPWT Apligraf, etc. , []  - 0 Emergency Hospital Admission (emergent condition) X- 1 10 Simple Discharge Coordination []  - 0 Complex (extensive) Discharge Coordination PROCESS - Special Needs []  - 0 Pediatric / Minor Patient Management []  - 0 Isolation Patient Management []  - 0 Hearing / Language / Visual special needs []  - 0 Assessment of Community assistance (transportation, D/C planning, etc.) []  - 0 Additional assistance / Altered  mentation []  - 0 Support Surface(s) Assessment (bed, cushion, seat, etc.) INTERVENTIONS - Wound Cleansing / Measurement X - Simple Wound Cleansing - one wound 1 5 []  - 0 Complex Wound  Cleansing - multiple wounds X- 1 5 Wound Imaging (photographs - any number of wounds) $RemoveBe'[]'MqfGnqYlj$  - 0 Wound Tracing (instead of photographs) X- 1 5 Simple Wound Measurement - one wound $RemoveB'[]'MoWoTwnK$  - 0 Complex Wound Measurement - multiple wounds INTERVENTIONS - Wound Dressings X - Small Wound Dressing one or multiple wounds 1 10 $Re'[]'oJo$  - 0 Medium Wound Dressing one or multiple wounds $RemoveBeforeD'[]'vLVxYCNfdffWQg$  - 0 Large Wound Dressing one or multiple wounds X- 1 5 Application of Medications - topical $RemoveB'[]'qZPLCffa$  - 0 Application of Medications - injection INTERVENTIONS - Miscellaneous $RemoveBeforeD'[]'sbKoJwLtGarHHD$  - 0 External ear exam $Remove'[]'fytCwqt$  - 0 Specimen Collection (cultures, biopsies, blood, body fluids, etc.) $RemoveBefor'[]'lxDDBBhjhXvM$  - 0 Specimen(s) / Culture(s) sent or taken to Lab for analysis $RemoveBefo'[]'QFhzcpCxdRZ$  - 0 Patient Transfer (multiple staff / Civil Service fast streamer / Similar devices) $RemoveBeforeDE'[]'njlhzuRQzXBOHIS$  - 0 Simple Staple / Suture removal (25 or less) $Remove'[]'dbeYKTN$  - 0 Complex Staple / Suture removal (26 or more) $Remove'[]'seIfCNL$  - 0 Hypo / Hyperglycemic Management (close monitor of Blood Glucose) $RemoveBefore'[]'lEviVTEHBuHHI$  - 0 Ankle / Brachial Index (ABI) - do not check if billed separately X- 1 5 Vital Signs Has the patient been seen at the hospital within the last three years: Yes Total Score: 95 Level Of Care: New/Established - Level 3 Electronic Signature(s) Signed: 05/27/2020 5:03:51 PM By: Baruch Gouty RN, BSN Entered By: Baruch Gouty on 05/27/2020 08:22:56 -------------------------------------------------------------------------------- Complex / Palliative Patient Assessment Details Patient Name: Date of Service: [name] 05/27/2020 8:00 A M Medical Record Number: [id number] Patient Account Number: [id number] Date of Birth/Sex: Treating RN: 07/16/1951 (69 y.o. Janyth Contes Primary Care Laqueisha Catalina: [name] Other Clinician: Referring  Amaziah Raisanen: Treating Elieser Tetrick/Extender: [name] in Treatment: 17 Palliative Management Criteria Complex Wound Management Criteria Patient has remarkable or complex co-morbidities requiring medications or treatments that extend wound healing times. Examples: Diabetes mellitus with chronic renal failure or end stage renal disease requiring dialysis Advanced or poorly controlled rheumatoid arthritis Diabetes mellitus and end stage chronic obstructive pulmonary disease Active cancer with current chemo- or radiation therapy Type 1 Diabetes, PAD, Osteomyelitis Care Approach Wound Care Plan: Complex Wound Management Electronic Signature(s) Signed: 05/31/2020 5:41:32 PM By: Levan Hurst RN, BSN Signed: 06/03/2020 5:03:49 PM By: Linton Ham MD Entered By: Levan Hurst on 05/31/2020 10:13:25 -------------------------------------------------------------------------------- Encounter Discharge Information Details Patient Name: Date of Service: [name] 05/27/2020 8:00 A M Medical Record Number: [id number] Patient Account Number: [id number] Date of Birth/Sex: Treating RN: [birth year] (69 y.o. Hessie Diener Primary Care Drinda Belgard: [name] Other Clinician: Referring Saket Hellstrom: Treating Olive Zmuda/Extender: [name] in Treatment: 17 Encounter Discharge Information Items Discharge Condition: Stable Ambulatory Status: Ambulatory Discharge Destination: Home Transportation: Private Auto Accompanied By: wife Schedule Follow-up Appointment: Yes Clinical Summary of Care: Electronic Signature(s) Signed: 05/27/2020 4:56:48 PM By: Deon Pilling Entered By: Deon Pilling on 05/27/2020 08:41:56 -------------------------------------------------------------------------------- Lower Extremity Assessment Details Patient Name: Date of Service: BRAXTYN, BOJARSKI 05/27/2020 8:00 A M Medical Record Number: [id number] Patient Account Number:  [id number] Date of Birth/Sex: Treating RN: 1951/02/08 (69 y.o. [name] Primary Care Kowen Kluth: [name] Other Clinician: Referring Tekesha Almgren: Treating Landin Tallon/Extender: [name] in Treatment: 17 Edema Assessment Assessed: Shirlyn Goltz: No] Patrice Paradise: No] Edema: [Left: N] [Right: o] Calf Left: Right: Point of Measurement: From Medial Instep 34 cm Ankle Left: Right: Point of Measurement: From Medial Instep 21.5 cm Vascular Assessment Pulses: Dorsalis Pedis Palpable: [Right:Yes] Electronic Signature(s) Signed: 05/27/2020 5:08:20 PM By: Kela Millin Entered By: Kela Millin on 05/27/2020  56:25:63 -------------------------------------------------------------------------------- Multi Wound Chart Details Patient Name: Date of Service: JOSHAWA, DUBIN 05/27/2020 8:00 A M Medical Record Number: [id number] Patient Account Number: [id number] Date of Birth/Sex: Treating RN: Dec 17, 1950 (69 y.o. Ernestene Mention Primary Care Jyoti Harju: [name] Other Clinician: Referring Elliotte Marsalis: Treating Birdie Fetty/Extender: [name] in Treatment: 17 Vital Signs Height(in): 71 Pulse(bpm): 6 Weight(lbs): 152 Blood Pressure(mmHg): 150/66 Body Mass Index(BMI): 21 Temperature(F): 97.7 Respiratory Rate(breaths/min): 18 Photos: [1:No Photos Right T Great oe] [N/A:N/A N/A] Wound Location: [1:Gradually Appeared] [N/A:N/A] Wounding Event: [1:Diabetic Wound/Ulcer of the Lower] [N/A:N/A] Primary Etiology: [1:Extremity Cataracts, Coronary Artery Disease, N/A] Comorbid History: [1:Peripheral Arterial Disease, Type I Diabetes, Osteomyelitis, Neuropathy 11/23/2019] [N/A:N/A] Date Acquired: [1:17] [N/A:N/A] Weeks of Treatment: [1:Open] [N/A:N/A] Wound Status: [1:0.7x1x0.2] [N/A:N/A] Measurements L x W x D (cm) [1:0.55] [N/A:N/A] A (cm) : rea [1:0.11] [N/A:N/A] Volume (cm) : [1:-366.10%] [N/A:N/A] % Reduction in A rea: [1:-816.70%]  [N/A:N/A] % Reduction in Volume: [1:Grade 2] [N/A:N/A] Classification: [1:Medium] [N/A:N/A] Exudate A mount: [1:Serosanguineous] [N/A:N/A] Exudate Type: [1:red, brown] [N/A:N/A] Exudate Color: [1:Flat and Intact] [N/A:N/A] Wound Margin: [1:Large (67-100%)] [N/A:N/A] Granulation A mount: [1:Red, Pink] [N/A:N/A] Granulation Quality: [1:Small (1-33%)] [N/A:N/A] Necrotic A mount: [1:Fat Layer (Subcutaneous Tissue): Yes N/A] Exposed Structures: [1:Fascia: No Tendon: No Muscle: No Joint: No Bone: No Small (1-33%)] [N/A:N/A] Treatment Notes Electronic Signature(s) Signed: 05/27/2020 4:54:01 PM By: Linton Ham MD Signed: 05/27/2020 5:03:51 PM By: Baruch Gouty RN, BSN Entered By: Linton Ham on 05/27/2020 08:23:11 -------------------------------------------------------------------------------- Multi-Disciplinary Care Plan Details Patient Name: Date of Service: [name] 05/27/2020 8:00 A M Medical Record Number: [id number] Patient Account Number: [id number] Date of Birth/Sex: Treating RN: 11-Apr-1951 (69 y.o. Ernestene Mention Primary Care Elide Stalzer: [name] Other Clinician: Referring Graceyn Fodor: Treating Reneta Niehaus/Extender: [name] in Treatment: 17 Active Inactive Wound/Skin Impairment Nursing Diagnoses: Knowledge deficit related to ulceration/compromised skin integrity Goals: Patient/caregiver will verbalize understanding of skin care regimen Date Initiated: 01/23/2020 Target Resolution Date: 06/21/2020 Goal Status: Active Ulcer/skin breakdown will have a volume reduction of 30% by week 4 Date Initiated: 01/23/2020 Date Inactivated: 03/05/2020 Target Resolution Date: 02/22/2020 Goal Status: Met Ulcer/skin breakdown will have a volume reduction of 50% by week 8 Date Initiated: 03/05/2020 Date Inactivated: 04/02/2020 Target Resolution Date: 03/24/2020 Goal Status: Unmet Unmet Reason: comorbities Ulcer/skin breakdown will have a volume  reduction of 80% by week 12 Date Initiated: 04/02/2020 Date Inactivated: 04/22/2020 Target Resolution Date: 04/24/2020 Goal Status: Unmet Unmet Reason: Osteomyelitis Interventions: Assess patient/caregiver ability to obtain necessary supplies Assess patient/caregiver ability to perform ulcer/skin care regimen upon admission and as needed Assess ulceration(s) every visit Notes: Electronic Signature(s) Signed: 05/27/2020 5:03:51 PM By: Baruch Gouty RN, BSN Entered By: Baruch Gouty on 05/27/2020 08:11:40 -------------------------------------------------------------------------------- Pain Assessment Details Patient Name: Date of Service: Lupita Leash 05/27/2020 8:00 A M Medical Record Number: [id number] Patient Account Number: [id number] Date of Birth/Sex: Treating RN: [birth year] (69 y.o. [name] Primary Care Nannette Zill: [name] Other Clinician: Referring Hebert Dooling: Treating Judye Lorino/Extender: [name] in Treatment: 17 Active Problems Location of Pain Severity and Description of Pain Patient Has Paino No Site Locations Pain Management and Medication Current Pain Management: Electronic Signature(s) Signed: 05/27/2020 5:08:20 PM By: Kela Millin Entered By: Kela Millin on 05/27/2020 08:05:54 -------------------------------------------------------------------------------- Patient/Caregiver Education Details Patient Name: Date of Service: Lupita Leash 10/4/2021andnbsp8:00 A M Medical Record Number: [id number] Patient Account Number: [id number] Date of Birth/Gender: Treating RN: 1951-03-19 (69 y.o. Ernestene Mention Primary Care Physician: [name] Other Clinician:  Referring Physician: Treating Physician/Extender: [name] in Treatment: 37 Education Assessment Education Provided To: Patient Education Topics Provided Wound/Skin Impairment: Methods: Explain/Verbal Responses:  Reinforcements needed, State content correctly [employer]) Signed: 05/27/2020 5:03:51 PM By: Baruch Gouty RN, BSN Entered By: Baruch Gouty on 05/27/2020 08:14:51 -------------------------------------------------------------------------------- Wound Assessment Details Patient Name: Date of Service: [name] 05/27/2020 8:00 A M Medical Record Number: [id number] Patient Account Number: [id number] Date of Birth/Sex: Treating RN: 07-25-1951 (69 y.o. [name] Primary Care Nakeitha Milligan: [name] Other Clinician: Referring Kaity Pitstick: Treating Makalia Bare/Extender: [name] in Treatment: 17 Wound Status Wound Number: 1 Primary Diabetic Wound/Ulcer of the Lower Extremity Etiology: Wound Location: Right T Great oe Wound Open Wounding Event: Gradually Appeared Status: Date Acquired: 11/23/2019 Comorbid Cataracts, Coronary Artery Disease, Peripheral Arterial Disease, Weeks Of Treatment: 17 History: Type I Diabetes, Osteomyelitis, Neuropathy Clustered Wound: No Photos Photo Uploaded By: Mikeal Hawthorne on 05/29/2020 11:14:40 Wound Measurements Length: (cm) 0.7 Width: (cm) 1 Depth: (cm) 0.2 Area: (cm) 0.55 Volume: (cm) 0.11 % Reduction in Area: -366.1% % Reduction in Volume: -816.7% Epithelialization: Small (1-33%) Tunneling: No Undermining: No Wound Description Classification: Grade 2 Wound Margin: Flat and Intact Exudate Amount: Medium Exudate Type: Serosanguineous Exudate Color: red, brown Foul Odor After Cleansing: No Slough/Fibrino Yes Wound Bed Granulation Amount: Large (67-100%) Exposed Structure Granulation Quality: Red, Pink Fascia Exposed: No Necrotic Amount: Small (1-33%) Fat Layer (Subcutaneous Tissue) Exposed: Yes Necrotic Quality: Adherent Slough Tendon Exposed: No Muscle Exposed: No Joint Exposed: No Bone Exposed: No Electronic Signature(s) Signed: 05/27/2020 5:08:20 PM By: Kela Millin Entered By: Kela Millin on 05/27/2020 08:07:06 -------------------------------------------------------------------------------- Vitals Details Patient Name: Date of Service: [name] 05/27/2020 8:00 A M Medical Record Number: [id number] Patient Account Number: [id number] Date of Birth/Sex: Treating RN: 1951/07/05 (69 y.o. [name] Primary Care Amyjo Mizrachi: [name] Other Clinician: Referring Timo Hartwig: Treating Qianna Clagett/Extender: [name] in Treatment: 17 Vital Signs Time Taken: 08:00 Temperature (F): 97.7 Height (in): 71 Pulse (bpm): 58 Weight (lbs): 152 Respiratory Rate (breaths/min): 18 Body Mass Index (BMI): 21.2 Blood Pressure (mmHg): 150/66 Reference Range: 80 - 120 mg / dl Electronic Signature(s) Signed: 05/27/2020 5:08:20 PM By: Kela Millin Entered By: Kela Millin on 05/27/2020 08:05:50

## 2020-05-27 NOTE — Progress Notes (Signed)
KERN, GINGRAS ([id number]) Visit Report for 05/27/2020 HPI Details Patient Name: Date of Service: [name], [name] 05/27/2020 8:00 A M Medical Record Number: [id number] Patient Account Number: [id number] Date of Birth/Sex: Treating RN: September 14, 1950 (69 y.o. 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 He is a type I diabetic on an insulin pump diagnosed at age 63. He also has known peripheral neuropathy. He has been dealing with a wound on his right first toe since at least February by review of epic. He was seen by Dr. Lajoyce Corners. Diagnosed with an ischemic wound sent to see Dr. Myra Gianotti him who noted a noncompressible ABI 1.67 and a TBI of 0.25 monophasic waveforms. On 11/15/2019 he underwent a right femoral to below-knee popliteal bypass with an ipsilateral saphenous vein. He also underwent extensive endarterectomy I believe that the external iliac. Postoperative ABI was 1.16 he has been using Neosporin on this and gradually making progress according to his companion. He is offloading this with a hole in the inserts in his work shoes. Per his companion who does the dressings the wound has been getting smaller Past medical history; type 1 diabetes with PAD and PN, history of a left BKA in 2017, coronary artery disease, hypercholesterolemia, hypertension he is a smoker 6/29; this is a patient I have not seen in 4 weeks. He is a type I diabetic on an insulin pump with a prior left BKA. He was revascularized before he came to our clinic with a right femoral to below-knee popliteal bypass with a saphenous vein. He also had an extensive endarterectomy. We used Hydrofera Blue to this wound. 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  Wears work boots. 7/13; type I diabetic with an area on the plantar tip of his right great toe in the setting of a previous left BKA and extensive endarterectomy with a right femoral to below-knee popliteal bypass before he came to our clinic. We have been using Hydrofera Blue the wound is measuring small. 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 Previous left BKA. He is also had an extensive endarterectomy with a right femoral to below-knee popliteal bypass before he came here. We have been using Hydrofera Blue. Nice improvements in the wound which is small and superficial now. [facility] I think he made this on his own but we have talked about this last week. He has a hammer deformity of the toe. I also wondered if they could be helpful in modifying his foot wear. 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 For the next 3 weeks he is traveling with his wife and therefore will not be outdoors as much on his farm. 8/17; patient returns to clinic today with a much bigger  wound than what I was used to seeing 3 weeks ago. I have looked through his arterial studies from 7/12. At that point he had noncompressible ABIs at 1.72 biphasic waveforms at the PTA and monophasic waveforms at the dorsalis pedis. Unfortunately they did not do a TBI on the great toe because it was bandaged. The interpretation however suggested waveforms showed adequate perfusion. The patient states that he saw Dr. Myra Gianotti about a month ago, I will see if  I can pull this record. As noted previously the patient is a very active man works with a left below-knee prosthesis. The great toe has a bit of a hammer deformity to it. He is trying to offload this using modified insoles in his shoes i.e. cutting out a part of this to lift the toe off the bottom of his shoe. 8/23; culture grew abundant amount of Enterobacter and a few methicillin sensitive staph aureus. I empirically put him on Levaquin in response to this although we were not able to get a hold of him by phone to let them know that the antibiotic was at his pharmacy therefore he has not started it yet. I encouraged him to start today. The wound is larger, a lot of drainage. There is nothing that probes to bone however we are certainly not making any progress. He is attempting to modify his foot wear. 8/30; X-ray ORDERED last WEEK showed suggestion of osteomyelitis of the first tuft of the right great toe. I looked at this x-ray indeed it does look damaged. He reminds me that he has rods and screws in his left arm from remote trauma. He will not be a candidate for an MRI. Nevertheless I think it is important to be sure of the diagnosis here. The patient also has severe PAD. Amputation of parts of the total toe may be necessary if this does not heal. He started Levaquin last week. Using silver alginate on the wound 9/13; we still do not have a CT scan. He is completing 2 weeks of Levaquin and 2 weeks of Flagyl. Most of his complaints of side effects I think are related to the Flagyl but that can stop as of when he finishes his medications either later today or tomorrow. He tells me that he has been using Neosporin on this for the last week, thinking that the silver alginate causes drainage. I do not know told him I thought this would be unlikely. It looks like he is offloading this better 9/28. Very small improvement in measurements. Surface looks reasonably healthy. He has finished 4 weeks of  Levaquin and 2 weeks of Flagyl. He has a CT scan on October 5. Change the dressing to [facility] today 10/4; dimensions about the same however the wound looks very healthy. We are using Hydrofera Blue. Felt offloading. 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 He has 1 more week of Levaquin CT scan tomorrow Electronic Signature(s) Signed: 05/27/2020 4:54:01 PM By: Baltazar Najjar MD Entered By: Baltazar Najjar on 05/27/2020 08:24:18 -------------------------------------------------------------------------------- Physical Exam Details Patient Name: Date of Service: [name]. 05/27/2020 8:00 A M Medical Record Number: [id number] Patient Account Number: [id number] Date of Birth/Sex: Treating RN: April 18, 1951 (69 y.o. [name] Primary Care Provider: [name] Other Clinician: Referring Provider: Treating Provider/Extender: Lacretia Leigh in Treatment: 17 Constitutional Patient is hypertensive.. Pulse regular and within target range for patient.Marland Kitchen Respirations regular, non-labored and within target range.. Temperature is normal and within the target range for the patient.Marland Kitchen  Appears in no distress. Notes Wound exam; plantar right great toe distal phalanx. Wound has a really healthy looking surface. Rim of epithelialization no need for debridement. No surrounding infection Electronic Signature(s) Signed: 05/27/2020 4:54:01 PM By: Baltazar Najjar MD Entered By: Baltazar Najjar on 05/27/2020 08:25:58 -------------------------------------------------------------------------------- Physician Orders Details Patient Name: Date of Service: [name]. 05/27/2020 8:00 A M Medical Record Number: [id number] Patient Account Number: [id number] Date of Birth/Sex: Treating RN: Oct 14, 1950 (69 y.o. 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 Dressing Change Frequency Wound #1 Right T Great oe Change dressing every day. Wound Cleansing Wound #1 Right T Great oe May shower and wash wound with soap and water. Primary Wound Dressing Wound #1 Right T Great oe Hydrofera Blue - classic Secondary Dressing Wound #1 Right T Great oe Foam - felt callous pad Kerlix/Rolled Gauze Dry Gauze Electronic Signature(s) Signed: 05/27/2020 4:54:01 PM By: Baltazar Najjar MD Signed: 05/27/2020 5:03:51 PM By: Zenaida Deed RN, BSN Entered By: Zenaida Deed on 05/27/2020 08:20:19 -------------------------------------------------------------------------------- Problem List Details Patient Name: Date of Service: [name]. 05/27/2020 8:00 A M Medical Record Number: [id number] Patient Account Number: [id number] Date of Birth/Sex: Treating RN: 08-18-1951 (69 y.o. [name] Primary Care Provider: [name] Other Clinician: Referring Provider: Treating Provider/Extender: Lacretia Leigh in Treatment: 17 Active Problems ICD-10 Encounter Code Description Active Date MDM Diagnosis E10.621 Type 1 diabetes mellitus with foot ulcer 01/23/2020 No Yes L97.512 Non-pressure chronic ulcer of other part of right foot with fat layer exposed 01/23/2020 No Yes E10.51 Type 1 diabetes mellitus with diabetic peripheral angiopathy without gangrene 01/23/2020 No Yes E10.42 Type 1 diabetes mellitus with diabetic polyneuropathy 01/23/2020 No Yes M86.671 Other  chronic osteomyelitis, right ankle and foot 04/22/2020 No Yes Inactive Problems Resolved Problems Electronic Signature(s) Signed: 05/27/2020 4:54:01 PM By: Baltazar Najjar MD Entered By: Baltazar Najjar on 05/27/2020 08:23:04 -------------------------------------------------------------------------------- Progress Note Details Patient Name: Date of Service: [name] 05/27/2020 8:00 A M Medical Record Number: [id number] Patient Account Number: [id number] Date of Birth/Sex: Treating RN: 10/17/1950 (69 y.o. 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 He is a type I diabetic on an insulin pump diagnosed at age 47. He also has known peripheral neuropathy. He has been dealing with a wound on his right first toe since at least February by review of epic. He was seen by Dr. Lajoyce Corners. Diagnosed with an ischemic wound sent to see Dr. Myra Gianotti him who noted a noncompressible ABI 1.67 and a TBI of 0.25 monophasic waveforms. On 11/15/2019 he underwent a right femoral to below-knee popliteal bypass with an ipsilateral saphenous vein. He also underwent extensive endarterectomy I believe that the external iliac. Postoperative ABI was 1.16 he has been using Neosporin on this and gradually making progress according to his companion. He  is offloading this with a hole in the inserts in his work shoes. Per his companion who does the dressings the wound has been getting smaller Past medical history; type 1 diabetes with PAD and PN, history of a left BKA in 2017, coronary artery disease, hypercholesterolemia, hypertension he is a smoker 6/29; this is a patient I have not seen in 4 weeks. He is a type I diabetic on an insulin pump with a  prior left BKA. He was revascularized before he came to our clinic with a right femoral to below-knee popliteal bypass with a saphenous vein. He also had an extensive endarterectomy. We used Hydrofera Blue to this wound. 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 Wears work boots. 7/13; type I diabetic with an area on the plantar tip of his right great toe in the setting of a previous left BKA and extensive endarterectomy with a right femoral to below-knee popliteal bypass before he came to our clinic. We have been using Hydrofera Blue the wound is measuring small. 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 Previous left BKA. He is also had an extensive endarterectomy with a right femoral to below-knee popliteal bypass before he came here. We have been using Hydrofera Blue. Nice improvements in the wound which is small and superficial now. [facility] I think he made this on his own but we have talked about this last week. He has a hammer deformity of the toe. I also wondered if they could be helpful in modifying his foot wear. 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 For the next 3 weeks he is traveling with his wife and therefore will not be outdoors as much on his farm. 8/17; patient returns to clinic today with a much bigger wound than what I was used to seeing 3 weeks ago. I have looked through his arterial studies from 7/12. At that point he had noncompressible ABIs at 1.72 biphasic waveforms at the PTA and monophasic  waveforms at the dorsalis pedis. Unfortunately they did not do a TBI on the great toe because it was bandaged. The interpretation however suggested waveforms showed adequate perfusion. The patient states that he saw Dr. Myra Gianotti about a month ago, I will see if I can pull this record. As noted previously the patient is a very active man works with a left below-knee prosthesis. The great toe has a bit of a hammer deformity to it. He is trying to offload this using modified insoles in his shoes i.e. cutting out a part of this to lift the toe off the bottom of his shoe. 8/23; culture grew abundant amount of Enterobacter and a few methicillin sensitive staph aureus. I empirically put him on Levaquin in response to this although we were not able to get a hold of him by phone to let them know that the antibiotic was at his pharmacy therefore he has not started it yet. I encouraged him  to start today. The wound is larger, a lot of drainage. There is nothing that probes to bone however we are certainly not making any progress. He is attempting to modify his foot wear. 8/30; X-ray ORDERED last WEEK showed suggestion of osteomyelitis of the first tuft of the right great toe. I looked at this x-ray indeed it does look damaged. He reminds me that he has rods and screws in his left arm from remote trauma. He will not be a candidate for an MRI. Nevertheless I think it is important to be sure of the diagnosis here. The patient also has severe PAD. Amputation of parts of the total toe may be necessary if this does not heal. He started Levaquin last week. Using silver alginate on the wound 9/13; we still do not have a CT scan. He is completing 2 weeks of Levaquin and 2 weeks of Flagyl. Most of his complaints of side effects I think are related to the Flagyl but that can stop as of when he finishes his medications either later today or tomorrow. He tells me that he has been using Neosporin on this for the last week,  thinking that the silver alginate causes drainage. I do not know told him I thought this would be unlikely. It looks like he is offloading this better 9/28. Very small improvement in measurements. Surface looks reasonably healthy. He has finished 4 weeks of Levaquin and 2 weeks of Flagyl. He has a CT scan on October 5. Change the dressing to [facility] today 10/4; dimensions about the same however the wound looks very healthy. We are using Hydrofera Blue. Felt offloading. 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 He has 1 more week of Levaquin CT scan tomorrow Objective Constitutional Patient is hypertensive.. Pulse regular and within target range for patient.Marland Kitchen Respirations regular, non-labored and within target range.. Temperature is normal and within the target range for the patient.Marland Kitchen Appears in no distress. Vitals Time Taken: 8:00 AM, Height: 71 in, Weight: 152 lbs, BMI: 21.2, Temperature: 97.7 F, Pulse: 58 bpm, Respiratory Rate: 18 breaths/min, Blood Pressure: 150/66 mmHg. General Notes: Wound exam; plantar right great toe distal phalanx. Wound has a really healthy looking surface. Rim of epithelialization no need for debridement. No surrounding infection Integumentary (Hair, Skin) Wound #1 status is Open. Original cause of wound was Gradually Appeared. The wound is located on the Right T Great. The wound measures 0.7cm length x oe 1cm width x 0.2cm depth; 0.55cm^2 area and 0.11cm^3 volume. There is Fat Layer (Subcutaneous Tissue) exposed. There is no tunneling or undermining noted. There is a medium amount of serosanguineous drainage noted. The wound margin is flat and intact. There is large (67-100%) red, pink granulation within the wound bed. There is a small (1-33%) amount of necrotic tissue within the wound bed including Adherent Slough. Assessment Active Problems ICD-10 Type 1 diabetes mellitus with foot ulcer Non-pressure chronic ulcer of other part of right foot with fat layer  exposed Type 1 diabetes mellitus with diabetic peripheral angiopathy without gangrene Type 1 diabetes mellitus with diabetic polyneuropathy Other chronic osteomyelitis, right ankle and foot Plan Follow-up Appointments: Return Appointment in 1 week. Dressing Change Frequency: Wound #1 Right T Great: oe Change dressing every day. Wound Cleansing: Wound #1 Right T Great: oe May shower and wash wound with soap and water. Primary Wound Dressing: Wound #1 Right T Great: oe Hydrofera Blue - classic Secondary Dressing: Wound #1 Right T Great: oe Foam - felt callous pad Kerlix/Rolled Gauze  Dry Gauze 1. Hydrofera Blue, felt offloading, gauze 2. CT scan is tomorrow he is completing his last week of Levaquin 3. Have had some thoughts about whether the CT scan is really necessary although I elected to go through with Electronic Signature(s) Signed: 05/27/2020 4:54:01 PM By: [name], Joselynn Amoroso MD Entered By: [name], Chaim Gatley on 05/27/2020 08:26:44 -------------------------------------------------------------------------------- SuperBill Details Patient Name: Date of Service: [name], Benoit L. 05/27/2020 Medical Record Number: [id number] Patient Account Number: [id number] Date of Birth/Sex: Treating RN: Apr 13, 1951 (69 y.o. 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

## 2020-05-28 ENCOUNTER — Other Ambulatory Visit: Payer: Self-pay

## 2020-05-28 ENCOUNTER — Ambulatory Visit (HOSPITAL_COMMUNITY)
Admission: RE | Admit: 2020-05-28 | Discharge: 2020-05-28 | Disposition: A | Discharge status: Home / Self Care | Payer: Medicare Other | Source: Ambulatory Visit | Attending: Internal Medicine | Admitting: Internal Medicine

## 2020-05-28 DIAGNOSIS — E1051 Type 1 diabetes mellitus with diabetic peripheral angiopathy without gangrene: Secondary | ICD-10-CM | POA: Diagnosis present

## 2020-05-28 DIAGNOSIS — L97512 Non-pressure chronic ulcer of other part of right foot with fat layer exposed: Secondary | ICD-10-CM

## 2020-05-28 DIAGNOSIS — L97509 Non-pressure chronic ulcer of other part of unspecified foot with unspecified severity: Secondary | ICD-10-CM | POA: Insufficient documentation

## 2020-05-28 DIAGNOSIS — E1042 Type 1 diabetes mellitus with diabetic polyneuropathy: Secondary | ICD-10-CM

## 2020-05-28 DIAGNOSIS — E10621 Type 1 diabetes mellitus with foot ulcer: Secondary | ICD-10-CM

## 2020-05-28 LAB — POCT I-STAT CREATININE: Creatinine, Ser: 0.8 mg/dL (ref 0.61–1.24)

## 2020-05-28 MED ORDER — IOHEXOL 300 MG/ML  SOLN
100.0000 mL | Freq: Once | INTRAMUSCULAR | Status: AC | PRN
  Administered 2020-05-28: 100 mL via INTRAVENOUS

## 2020-06-03 ENCOUNTER — Encounter (HOSPITAL_BASED_OUTPATIENT_CLINIC_OR_DEPARTMENT_OTHER): Payer: Medicare Other | Admitting: Internal Medicine

## 2020-06-03 DIAGNOSIS — E10621 Type 1 diabetes mellitus with foot ulcer: Secondary | ICD-10-CM | POA: Diagnosis not present

## 2020-06-03 NOTE — Progress Notes (Signed)
EIDAN, MUELLNER ([id number]) Visit Report for 06/03/2020 Arrival Information Details Patient Name: Date of Service: RAINIER, FEUERBORN 06/03/2020 8:00 A M Medical Record Number: [id number] Patient Account Number: [id number] Date of Birth/Sex: Treating RN: 10/20/1950 (69 y.o. Lorette Ang, Meta.Reding Primary Care Zenobia Kuennen: [name] Other Clinician: Referring Jobanny Mavis: Treating Tavian Callander/Extender: [name] in Treatment: 72 Visit Information History Since Last Visit All ordered tests and consults were completed: Yes Patient Arrived: Ambulatory Added or deleted any medications: No Arrival Time: 08:00 Any new allergies or adverse reactions: No Accompanied By: self Had a fall or experienced change in No Transfer Assistance: None activities of daily living that may affect Patient Identification Verified: Yes risk of falls: Secondary Verification Process Completed: Yes Signs or symptoms of abuse/neglect since last visito No Patient Requires Transmission-Based Precautions: No Hospitalized since last visit: No Patient Has Alerts: Yes Implantable device outside of the clinic excluding No Patient Alerts: R ABI non compressible cellular tissue based products placed in the center since last visit: Has Dressing in Place as Prescribed: Yes Pain Present Now: No Electronic Signature(s) Signed: 06/03/2020 5:08:17 PM By: Deon Pilling Entered By: Deon Pilling on 06/03/2020 08:00:23 -------------------------------------------------------------------------------- Clinic Level of Care Assessment Details Patient Name: Date of Service: MARLYN, RABINE 06/03/2020 8:00 A M Medical Record Number: [id number] Patient Account Number: [id number] Date of Birth/Sex: Treating RN: [birth year] (69 y.o. Janyth Contes Primary Care Scorpio Fortin: [name] Other Clinician: Referring Navin Dogan: Treating Lonni Dirden/Extender: [name] in Treatment: 18 Clinic Level of  Care Assessment Items TOOL 4 Quantity Score X- 1 0 Use when only an EandM is performed on FOLLOW-UP visit ASSESSMENTS - Nursing Assessment / Reassessment X- 1 10 Reassessment of Co-morbidities (includes updates in patient status) X- 1 5 Reassessment of Adherence to Treatment Plan ASSESSMENTS - Wound and Skin A ssessment / Reassessment X - Simple Wound Assessment / Reassessment - one wound 1 5 []  - 0 Complex Wound Assessment / Reassessment - multiple wounds []  - 0 Dermatologic / Skin Assessment (not related to wound area) ASSESSMENTS - Focused Assessment []  - 0 Circumferential Edema Measurements - multi extremities []  - 0 Nutritional Assessment / Counseling / Intervention X- 1 5 Lower Extremity Assessment (monofilament, tuning fork, pulses) []  - 0 Peripheral Arterial Disease Assessment (using hand held doppler) ASSESSMENTS - Ostomy and/or Continence Assessment and Care []  - 0 Incontinence Assessment and Management []  - 0 Ostomy Care Assessment and Management (repouching, etc.) PROCESS - Coordination of Care X - Simple Patient / Family Education for ongoing care 1 15 []  - 0 Complex (extensive) Patient / Family Education for ongoing care X- 1 10 Staff obtains Programmer, systems, Records, T Results / Process Orders est []  - 0 Staff telephones HHA, Nursing Homes / Clarify orders / etc []  - 0 Routine Transfer to another Facility (non-emergent condition) []  - 0 Routine Hospital Admission (non-emergent condition) []  - 0 New Admissions / Biomedical engineer / Ordering NPWT Apligraf, etc. , []  - 0 Emergency Hospital Admission (emergent condition) X- 1 10 Simple Discharge Coordination []  - 0 Complex (extensive) Discharge Coordination PROCESS - Special Needs []  - 0 Pediatric / Minor Patient Management []  - 0 Isolation Patient Management []  - 0 Hearing / Language / Visual special needs []  - 0 Assessment of Community assistance (transportation, D/C planning, etc.) []  -  0 Additional assistance / Altered mentation []  - 0 Support Surface(s) Assessment (bed, cushion, seat, etc.) INTERVENTIONS - Wound Cleansing / Measurement X - Simple Wound Cleansing - one  wound 1 5 $R'[]'HS$  - 0 Complex Wound Cleansing - multiple wounds X- 1 5 Wound Imaging (photographs - any number of wounds) $RemoveBe'[]'ddeHVdqmQ$  - 0 Wound Tracing (instead of photographs) X- 1 5 Simple Wound Measurement - one wound $RemoveB'[]'jlOXHqEC$  - 0 Complex Wound Measurement - multiple wounds INTERVENTIONS - Wound Dressings X - Small Wound Dressing one or multiple wounds 1 10 $Re'[]'LSg$  - 0 Medium Wound Dressing one or multiple wounds $RemoveBeforeD'[]'ZznoYBUfTVSeDA$  - 0 Large Wound Dressing one or multiple wounds X- 1 5 Application of Medications - topical $RemoveB'[]'YXJOpuAX$  - 0 Application of Medications - injection INTERVENTIONS - Miscellaneous $RemoveBeforeD'[]'WLhVWRlKrSlBxc$  - 0 External ear exam $Remove'[]'JynLuhF$  - 0 Specimen Collection (cultures, biopsies, blood, body fluids, etc.) $RemoveBefor'[]'eKopZWeMnIml$  - 0 Specimen(s) / Culture(s) sent or taken to Lab for analysis $RemoveBefo'[]'HqWSHNUhEgN$  - 0 Patient Transfer (multiple staff / Civil Service fast streamer / Similar devices) $RemoveBeforeDE'[]'uMlggSwIxgvCyaO$  - 0 Simple Staple / Suture removal (25 or less) $Remove'[]'ucmBYve$  - 0 Complex Staple / Suture removal (26 or more) $Remove'[]'VfifkOo$  - 0 Hypo / Hyperglycemic Management (close monitor of Blood Glucose) $RemoveBefore'[]'AnDbduQrgiRhY$  - 0 Ankle / Brachial Index (ABI) - do not check if billed separately X- 1 5 Vital Signs Has the patient been seen at the hospital within the last three years: Yes Total Score: 95 Level Of Care: New/Established - Level 3 Electronic Signature(s) Signed: 06/03/2020 5:10:40 PM By: Levan Hurst RN, BSN Entered By: Levan Hurst on 06/03/2020 08:46:11 -------------------------------------------------------------------------------- Encounter Discharge Information Details Patient Name: Date of Service: [name] 06/03/2020 8:00 A M Medical Record Number: [id number] Patient Account Number: [id number] Date of Birth/Sex: Treating RN: [birth year] (69 y.o. [name] Primary Care Jesilyn Easom: [name] Other  Clinician: Referring Vali Capano: Treating Matin Mattioli/Extender: [name] in Treatment: 18 Encounter Discharge Information Items Discharge Condition: Stable Ambulatory Status: Ambulatory Discharge Destination: Home Transportation: Private Auto Accompanied By: self Schedule Follow-up Appointment: Yes Clinical Summary of Care: Patient Declined Electronic Signature(s) Signed: 06/03/2020 5:18:16 PM By: Kela Millin Entered By: Kela Millin on 06/03/2020 08:49:55 -------------------------------------------------------------------------------- Lower Extremity Assessment Details Patient Name: Date of Service: Lupita Leash 06/03/2020 8:00 A M Medical Record Number: [id number] Patient Account Number: [id number] Date of Birth/Sex: Treating RN: Feb 26, 1951 (69 y.o. Hessie Diener Primary Care Darnisha Vernet: [name] Other Clinician: Referring Lytle Malburg: Treating Modell Fendrick/Extender: [name] in Treatment: 18 Edema Assessment Assessed: Shirlyn Goltz: No] Patrice Paradise: Yes] Edema: [Left: N] [Right: o] Calf Left: Right: Point of Measurement: From Medial Instep 32.7 cm Ankle Left: Right: Point of Measurement: From Medial Instep 22 cm Vascular Assessment Pulses: Dorsalis Pedis Palpable: [Right:Yes] Electronic Signature(s) Signed: 06/03/2020 5:08:17 PM By: Deon Pilling Entered By: Deon Pilling on 06/03/2020 08:06:33 -------------------------------------------------------------------------------- Multi Wound Chart Details Patient Name: Date of Service: [name] 06/03/2020 8:00 A M Medical Record Number: [id number] Patient Account Number: [id number] Date of Birth/Sex: Treating RN: [birth year] (69 y.o. Janyth Contes Primary Care Loraina Stauffer: [name] Other Clinician: Referring Ithiel Liebler: Treating Marek Nghiem/Extender: [name] in Treatment: 18 Vital Signs Height(in): 40 Capillary Blood Glucose(mg/dl):  560 Weight(lbs): 152 Pulse(bpm): 56 Body Mass Index(BMI): 21 Blood Pressure(mmHg): 133/82 Temperature(F): 98.2 Respiratory Rate(breaths/min): 18 Photos: [1:No Photos Right T Great oe] [N/A:N/A N/A] Wound Location: [1:Gradually Appeared] [N/A:N/A] Wounding Event: [1:Diabetic Wound/Ulcer of the Lower] [N/A:N/A] Primary Etiology: [1:Extremity Cataracts, Coronary Artery Disease, N/A] Comorbid History: [1:Peripheral Arterial Disease, Type I Diabetes, Osteomyelitis, Neuropathy 11/23/2019] [N/A:N/A] Date Acquired: [1:18] [N/A:N/A] Weeks of Treatment: [1:Open] [N/A:N/A] Wound Status: [1:0.5x0.7x0.2] [N/A:N/A] Measurements L x W x D (cm) [1:0.275] [N/A:N/A] A (  cm) : rea [1:0.055] [N/A:N/A] Volume (cm) : [1:-133.10%] [N/A:N/A] % Reduction in A rea: [1:-358.30%] [N/A:N/A] % Reduction in Volume: [1:Grade 2] [N/A:N/A] Classification: [1:Medium] [N/A:N/A] Exudate A mount: [1:Serosanguineous] [N/A:N/A] Exudate Type: [1:red, brown] [N/A:N/A] Exudate Color: [1:Flat and Intact] [N/A:N/A] Wound Margin: [1:Large (67-100%)] [N/A:N/A] Granulation A mount: [1:Red, Pink] [N/A:N/A] Granulation Quality: [1:None Present (0%)] [N/A:N/A] Necrotic A mount: [1:Fat Layer (Subcutaneous Tissue): Yes N/A] Exposed Structures: [1:Fascia: No Tendon: No Muscle: No Joint: No Bone: No Medium (34-66%)] [N/A:N/A] Treatment Notes Wound #1 (Right Toe Great) 1. Cleanse With Wound Cleanser 2. Periwound Care Skin Prep 3. Primary Dressing Applied Hydrofera Blue 4. Secondary Dressing Dry Gauze Roll Gauze 7. Footwear/Offloading device applied Felt/Foam Notes netting Electronic Signature(s) Signed: 06/03/2020 5:03:49 PM By: Linton Ham MD Signed: 06/03/2020 5:10:40 PM By: Levan Hurst RN, BSN Entered By: Linton Ham on 06/03/2020 08:56:11 -------------------------------------------------------------------------------- Multi-Disciplinary Care Plan Details Patient Name: Date of Service: [name]  06/03/2020 8:00 A M Medical Record Number: [id number] Patient Account Number: [id number] Date of Birth/Sex: Treating RN: December 13, 1950 (69 y.o. Janyth Contes Primary Care Lauryn Lizardi: [name] Other Clinician: Referring Massimiliano Rohleder: Treating Calleen Alvis/Extender: [name] in Treatment: 18 Active Inactive Wound/Skin Impairment Nursing Diagnoses: Knowledge deficit related to ulceration/compromised skin integrity Goals: Patient/caregiver will verbalize understanding of skin care regimen Date Initiated: 01/23/2020 Target Resolution Date: 06/21/2020 Goal Status: Active Ulcer/skin breakdown will have a volume reduction of 30% by week 4 Date Initiated: 01/23/2020 Date Inactivated: 03/05/2020 Target Resolution Date: 02/22/2020 Goal Status: Met Ulcer/skin breakdown will have a volume reduction of 50% by week 8 Date Initiated: 03/05/2020 Date Inactivated: 04/02/2020 Target Resolution Date: 03/24/2020 Goal Status: Unmet Unmet Reason: comorbities Ulcer/skin breakdown will have a volume reduction of 80% by week 12 Date Initiated: 04/02/2020 Date Inactivated: 04/22/2020 Target Resolution Date: 04/24/2020 Goal Status: Unmet Unmet Reason: Osteomyelitis Interventions: Assess patient/caregiver ability to obtain necessary supplies Assess patient/caregiver ability to perform ulcer/skin care regimen upon admission and as needed Assess ulceration(s) every visit Notes: Electronic Signature(s) Signed: 06/03/2020 5:10:40 PM By: Levan Hurst RN, BSN Entered By: Levan Hurst on 06/03/2020 08:36:17 -------------------------------------------------------------------------------- Pain Assessment Details Patient Name: Date of Service: Lupita Leash 06/03/2020 8:00 A M Medical Record Number: [id number] Patient Account Number: [id number] Date of Birth/Sex: Treating RN: 12-Oct-1950 (69 y.o. Hessie Diener Primary Care Royce Stegman: [name] Other Clinician: Referring  Pamla Pangle: Treating Gayland Nicol/Extender: [name] in Treatment: 18 Active Problems Location of Pain Severity and Description of Pain Patient Has Paino No Site Locations Rate the pain. Current Pain Level: 0 Pain Management and Medication Current Pain Management: Medication: No Cold Application: No Rest: No Massage: No Activity: No T.E.N.S.: No Heat Application: No Leg drop or elevation: No Is the Current Pain Management Adequate: Adequate How does your wound impact your activities of daily livingo Sleep: No Bathing: No Appetite: No Relationship With Others: No Bladder Continence: No Emotions: No Bowel Continence: No Work: No Toileting: No Drive: No Dressing: No Hobbies: No Electronic Signature(s) Signed: 06/03/2020 5:08:17 PM By: Deon Pilling Entered By: Deon Pilling on 06/03/2020 08:02:33 -------------------------------------------------------------------------------- Patient/Caregiver Education Details Patient Name: Date of Service: Lupita Leash 10/11/2021andnbsp8:00 A M Medical Record Number: [id number] Patient Account Number: [id number] Date of Birth/Gender: Treating RN: 1951/01/22 (69 y.o. Janyth Contes Primary Care Physician: [name] Other Clinician: Referring Physician: Treating Physician/Extender: [name] in Treatment: 18 Education Assessment Education Provided To: Patient Education Topics Provided Wound/Skin Impairment: Methods: Explain/Verbal Responses: State content correctly [employer]) Signed:  06/03/2020 5:10:40 PM By: Levan Hurst RN, BSN Entered By: Levan Hurst on 06/03/2020 08:36:29 -------------------------------------------------------------------------------- Wound Assessment Details Patient Name: Date of Service: [name] 06/03/2020 8:00 A M Medical Record Number: [id number] Patient Account Number: [id number] Date of Birth/Sex: Treating  RN: [birth year] (69 y.o. Lorette Ang, Meta.Reding Primary Care Alyssandra Hulsebus: [name] Other Clinician: Referring San Lohmeyer: Treating Nyla Creason/Extender: [name] in Treatment: 18 Wound Status Wound Number: 1 Primary Diabetic Wound/Ulcer of the Lower Extremity Etiology: Wound Location: Right T Great oe Wound Open Wounding Event: Gradually Appeared Status: Date Acquired: 11/23/2019 Comorbid Cataracts, Coronary Artery Disease, Peripheral Arterial Disease, Weeks Of Treatment: 18 History: Type I Diabetes, Osteomyelitis, Neuropathy Clustered Wound: No Wound Measurements Length: (cm) 0.5 Width: (cm) 0.7 Depth: (cm) 0.2 Area: (cm) 0.275 Volume: (cm) 0.055 % Reduction in Area: -133.1% % Reduction in Volume: -358.3% Epithelialization: Medium (34-66%) Tunneling: No Undermining: No Wound Description Classification: Grade 2 Wound Margin: Flat and Intact Exudate Amount: Medium Exudate Type: Serosanguineous Exudate Color: red, brown Foul Odor After Cleansing: No Slough/Fibrino No Wound Bed Granulation Amount: Large (67-100%) Exposed Structure Granulation Quality: Red, Pink Fascia Exposed: No Necrotic Amount: None Present (0%) Fat Layer (Subcutaneous Tissue) Exposed: Yes Tendon Exposed: No Muscle Exposed: No Joint Exposed: No Bone Exposed: No Treatment Notes Wound #1 (Right Toe Great) 1. Cleanse With Wound Cleanser 2. Periwound Care Skin Prep 3. Primary Dressing Applied Hydrofera Blue 4. Secondary Dressing Dry Gauze Roll Gauze 7. Footwear/Offloading device applied Felt/Foam Notes netting Electronic Signature(s) Signed: 06/03/2020 5:08:17 PM By: Deon Pilling Entered By: Deon Pilling on 06/03/2020 08:06:13 -------------------------------------------------------------------------------- Vitals Details Patient Name: Date of Service: [name] 06/03/2020 8:00 A M Medical Record Number: [id number] Patient Account Number: [id number] Date of  Birth/Sex: Treating RN: October 20, 1950 (69 y.o. Lorette Ang, Tammi Klippel Primary Care Jannis Atkins: [name] Other Clinician: Referring Angelique Chevalier: Treating Letta Cargile/Extender: [name] in Treatment: 18 Vital Signs Time Taken: 08:00 Temperature (F): 98.2 Height (in): 71 Pulse (bpm): 71 Weight (lbs): 152 Respiratory Rate (breaths/min): 18 Body Mass Index (BMI): 21.2 Blood Pressure (mmHg): 133/82 Capillary Blood Glucose (mg/dl): 560 Reference Range: 80 - 120 mg / dl Notes Per patient forgot to apply insulin pump last night. At 4 am blood glucose 560. Per patient currently 400. MD made aware. Electronic Signature(s) Signed: 06/03/2020 5:08:17 PM By: Deon Pilling Entered By: Deon Pilling on 06/03/2020 08:02:25

## 2020-06-03 NOTE — Progress Notes (Signed)
EVIAN, SALGUERO ([id number]) Visit Report for 06/03/2020 HPI Details Patient Name: Date of Service: [name], [name] 06/03/2020 8:00 A M Medical Record Number: [id number] Patient Account Number: [id number] Date of Birth/Sex: Treating RN: 1951/04/11 (69 y.o. 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 He is a type I diabetic on an insulin pump diagnosed at age 69. He also has known peripheral neuropathy. He has been dealing with a wound on his right first toe since at least February by review of epic. He was seen by Dr. Lajoyce Corners. Diagnosed with an ischemic wound sent to see Dr. Myra Gianotti him who noted a noncompressible ABI 1.67 and a TBI of 0.25 monophasic waveforms. On 11/15/2019 he underwent a right femoral to below-knee popliteal bypass with an ipsilateral saphenous vein. He also underwent extensive endarterectomy I believe that the external iliac. Postoperative ABI was 1.16 he has been using Neosporin on this and gradually making progress according to his companion. He is offloading this with a hole in the inserts in his work shoes. Per his companion who does the dressings the wound has been getting smaller Past medical history; type 1 diabetes with PAD and PN, history of a left BKA in 2017, coronary artery disease, hypercholesterolemia, hypertension he is a smoker 6/29; this is a patient I have not seen in 4 weeks. He is a type I diabetic on an insulin pump with a prior left BKA. He was revascularized before he came to our clinic with a right femoral to below-knee popliteal bypass with a saphenous vein. He also had an extensive endarterectomy. We used Hydrofera Blue to this wound. 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  Wears work boots. 7/13; type I diabetic with an area on the plantar tip of his right great toe in the setting of a previous left BKA and extensive endarterectomy with a right femoral to below-knee popliteal bypass before he came to our clinic. We have been using Hydrofera Blue the wound is measuring small. 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 Previous left BKA. He is also had an extensive endarterectomy with a right femoral to below-knee popliteal bypass before he came here. We have been using Hydrofera Blue. Nice improvements in the wound which is small and superficial now. [facility] I think he made this on his own but we have talked about this last week. He has a hammer deformity of the toe. I also wondered if they could be helpful in modifying his foot wear. 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 For the next 3 weeks he is traveling with his wife and therefore will not be outdoors as much on his farm. 8/17; patient returns to clinic today with a much bigger  wound than what I was used to seeing 3 weeks ago. I have looked through his arterial studies from 7/12. At that point he had noncompressible ABIs at 1.72 biphasic waveforms at the PTA and monophasic waveforms at the dorsalis pedis. Unfortunately they did not do a TBI on the great toe because it was bandaged. The interpretation however suggested waveforms showed adequate perfusion. The patient states that he saw Dr. Myra Gianotti about a month ago, I will see if  I can pull this record. As noted previously the patient is a very active man works with a left below-knee prosthesis. The great toe has a bit of a hammer deformity to it. He is trying to offload this using modified insoles in his shoes i.e. cutting out a part of this to lift the toe off the bottom of his shoe. 8/23; culture grew abundant amount of Enterobacter and a few methicillin sensitive staph aureus. I empirically put him on Levaquin in response to this although we were not able to get a hold of him by phone to let them know that the antibiotic was at his pharmacy therefore he has not started it yet. I encouraged him to start today. The wound is larger, a lot of drainage. There is nothing that probes to bone however we are certainly not making any progress. He is attempting to modify his foot wear. 8/30; X-ray ORDERED last WEEK showed suggestion of osteomyelitis of the first tuft of the right great toe. I looked at this x-ray indeed it does look damaged. He reminds me that he has rods and screws in his left arm from remote trauma. He will not be a candidate for an MRI. Nevertheless I think it is important to be sure of the diagnosis here. The patient also has severe PAD. Amputation of parts of the total toe may be necessary if this does not heal. He started Levaquin last week. Using silver alginate on the wound 9/13; we still do not have a CT scan. He is completing 2 weeks of Levaquin and 2 weeks of Flagyl. Most of his complaints of side effects I think are related to the Flagyl but that can stop as of when he finishes his medications either later today or tomorrow. He tells me that he has been using Neosporin on this for the last week, thinking that the silver alginate causes drainage. I do not know told him I thought this would be unlikely. It looks like he is offloading this better 9/28. Very small improvement in measurements. Surface looks reasonably healthy. He has finished 4 weeks of  Levaquin and 2 weeks of Flagyl. He has a CT scan on October 5. Change the dressing to [facility] today 10/4; dimensions about the same however the wound looks very healthy. We are using Hydrofera Blue. Felt offloading. 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 He has 1 more week of Levaquin CT scan tomorrow 10/11; wound is contracting surface looks very healthy we have been using Hydrofera Blue His CT scan showed osteomyelitis of the distal tuft of the great toe this is what I thought it would show fortunately does not show any damage beyond what I thought. Also noted that she he had advanced neuropathic change at the Lisfranc joint with osteophytes and heterotrophic calcification. There was no evidence of soft tissue emphysema. He is completing his Levaquin I gave him 2 weeks of Flagyl as well I have told him I think we should wait and see now. If we can offload  this enough for him to heal. we will watch Electronic Signature(s) Signed: 06/03/2020 5:03:49 PM By: Baltazar Najjar MD Entered By: Baltazar Najjar on 06/03/2020 08:58:18 -------------------------------------------------------------------------------- Physical Exam Details Patient Name: Date of Service: [name]. 06/03/2020 8:00 A M Medical Record Number: [id number] Patient Account Number: [id number] Date of Birth/Sex: Treating RN: 06-Nov-1950 (69 y.o. [name] Primary Care Provider: [name] Other Clinician: Referring Provider: Treating Provider/Extender: Lacretia Leigh in Treatment: 18 Constitutional Sitting or standing Blood Pressure is within target range for patient.. Pulse regular and within target range for patient.Marland Kitchen Respirations regular, non-labored and within target range.. Temperature is normal and within the target range for the patient.Marland Kitchen Appears in no distress. Cardiovascular Pedal pulses are palpable. Musculoskeletal Right great toe is hammered at the interphalangeal joint there  is no tenderness here.. Notes Wound exam; right plantar great toe distal phalanx. Very healthy looking wound surface dimensions are smaller. No surrounding infection. No surrounding erythema Electronic Signature(s) Signed: 06/03/2020 5:03:49 PM By: Baltazar Najjar MD Entered By: Baltazar Najjar on 06/03/2020 09:01:38 -------------------------------------------------------------------------------- Physician Orders Details Patient Name: Date of Service: [name]. 06/03/2020 8:00 A M Medical Record Number: [id number] Patient Account Number: [id number] Date of Birth/Sex: Treating RN: 1951-06-14 (69 y.o. 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 Dressing Change Frequency Wound #1 Right T Great oe Change dressing every day. Wound Cleansing Wound #1 Right T Great oe May shower and wash wound with soap and water. Primary Wound Dressing Wound #1 Right T Great oe Hydrofera Blue - Classic Secondary Dressing Wound #1 Right T Great oe Foam - felt callous pad Kerlix/Rolled Gauze Dry Gauze Electronic Signature(s) Signed: 06/03/2020 5:03:49 PM By: Baltazar Najjar MD Signed: 06/03/2020 5:10:40 PM By: Zandra Abts RN, BSN Entered By: Zandra Abts on 06/03/2020 08:40:28 -------------------------------------------------------------------------------- Problem List Details Patient  Name: Date of Service: [name]. 06/03/2020 8:00 A M Medical Record Number: [id number] Patient Account Number: [id number] Date of Birth/Sex: Treating RN: Aug 27, 1950 (69 y.o. 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 06/03/2020 8:00 A M Medical Record Number: [id number] Patient Account Number: [id number] Date of Birth/Sex: Treating RN: [birth year] (69 y.o. 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 He is a type I diabetic on an insulin pump diagnosed at age 89. He also has known peripheral neuropathy. He has been dealing with a wound on his right first toe since at least February by review of epic. He was seen by Dr. Lajoyce Cornersuda. Diagnosed with an ischemic wound sent to see Dr.  Myra GianottiBrabham him who noted a noncompressible ABI 1.67 and a TBI of 0.25 monophasic waveforms. On 11/15/2019 he underwent a right femoral to below-knee popliteal bypass with an ipsilateral saphenous vein. He also underwent extensive endarterectomy I believe that the external iliac. Postoperative ABI was 1.16 he has been using Neosporin on this and gradually making progress according to his companion. He is offloading this with a hole in the inserts in his work shoes. Per his companion who does the dressings the wound has been getting smaller Past medical history; type 1 diabetes with PAD and PN, history of a left BKA in 2017, coronary artery disease, hypercholesterolemia, hypertension he is a smoker 6/29; this is a patient I have not seen in 4 weeks. He is a type I diabetic on an insulin pump with a prior left BKA. He was revascularized before he came to our clinic with a right femoral to below-knee popliteal bypass with a saphenous vein. He also had an extensive endarterectomy. We used Hydrofera Blue to this wound. 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 Wears work boots. 7/13; type I diabetic with an area on the plantar tip of his right great toe in the setting of a previous left BKA and extensive endarterectomy with a right femoral to below-knee popliteal bypass before he came to our clinic. We have been using Hydrofera Blue the wound is measuring small. 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 Previous left BKA. He is also had an extensive endarterectomy with a right femoral to below-knee popliteal bypass before he came here. We have been using Hydrofera Blue. Nice improvements in the wound which is small and superficial now. [facility] I think he made this on his own but we have talked about this last week. He has a  hammer deformity of the toe. I also wondered if they could be helpful in modifying his foot wear. 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 For the next 3 weeks he is traveling with his wife and therefore will not be outdoors as much on his farm. 8/17; patient returns to clinic today with a much bigger wound than what I was used to seeing 3 weeks ago. I have looked through his arterial studies from 7/12. At that point he had noncompressible ABIs at 1.72 biphasic waveforms at the PTA and monophasic waveforms at the dorsalis pedis. Unfortunately they did not do a TBI on the great toe because it was bandaged. The interpretation however suggested waveforms showed adequate perfusion. The  patient states that he saw Dr. Myra Gianotti about a month ago, I will see if I can pull this record. As noted previously the patient is a very active man works with a left below-knee prosthesis. The great toe has a bit of a hammer deformity to it. He is trying to offload this using modified insoles in his shoes i.e. cutting out a part of this to lift the toe off the bottom of his shoe. 8/23; culture grew abundant amount of Enterobacter and a few methicillin sensitive staph aureus. I empirically put him on Levaquin in response to this although we were not able to get a hold of him by phone to let them know that the antibiotic was at his pharmacy therefore he has not started it yet. I encouraged him to start today. The wound is larger, a lot of drainage. There is nothing that probes to bone however we are certainly not making any progress. He is attempting to modify his foot wear. 8/30; X-ray ORDERED last WEEK showed suggestion  of osteomyelitis of the first tuft of the right great toe. I looked at this x-ray indeed it does look damaged. He reminds me that he has rods and screws in his left arm from remote trauma. He will not be a candidate for an MRI. Nevertheless I think it is important to be sure of the diagnosis here. The patient also has severe PAD. Amputation of parts of the total toe may be necessary if this does not heal. He started Levaquin last week. Using silver alginate on the wound 9/13; we still do not have a CT scan. He is completing 2 weeks of Levaquin and 2 weeks of Flagyl. Most of his complaints of side effects I think are related to the Flagyl but that can stop as of when he finishes his medications either later today or tomorrow. He tells me that he has been using Neosporin on this for the last week, thinking that the silver alginate causes drainage. I do not know told him I thought this would be unlikely. It looks like he is offloading this better 9/28. Very small improvement in measurements. Surface looks reasonably healthy. He has finished 4 weeks of Levaquin and 2 weeks of Flagyl. He has a CT scan on October 5. Change the dressing to [facility] today 10/4; dimensions about the same however the wound looks very healthy. We are using Hydrofera Blue. Felt offloading. 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 He has 1 more week of Levaquin CT scan tomorrow 10/11; wound is contracting surface looks very healthy we have been using Hydrofera Blue His CT scan showed osteomyelitis of the distal tuft of the great toe this is what I thought it would show fortunately does not show any damage beyond what I thought. Also noted that she he had advanced neuropathic change at the Lisfranc joint with osteophytes and heterotrophic calcification. There was no evidence of soft tissue emphysema. He is completing his Levaquin I gave him 2 weeks of Flagyl as well I have told him I think we should wait and see now. If we  can offload this enough for him to heal. we will watch Objective Constitutional Sitting or standing Blood Pressure is within target range for patient.. Pulse regular and within target range for patient.Marland Kitchen Respirations regular, non-labored and within target range.. Temperature is normal and within the target range for the patient.Marland Kitchen Appears in no distress. Vitals Time Taken: 8:00 AM, Height: 71 in, Weight: 152 lbs, BMI: 21.2,  T emperature: 98.2 F, Pulse: 71 bpm, Respiratory Rate: 18 breaths/min, Blood Pressure: 133/82 mmHg, Capillary Blood Glucose: 560 mg/dl. General Notes: Per patient forgot to apply insulin pump last night. At 4 am blood glucose 560. Per patient currently 400. MD made aware. Cardiovascular Pedal pulses are palpable. Musculoskeletal Right great toe is hammered at the interphalangeal joint there is no tenderness here.. General Notes: Wound exam; right plantar great toe distal phalanx. Very healthy looking wound surface dimensions are smaller. No surrounding infection. No surrounding erythema Integumentary (Hair, Skin) Wound #1 status is Open. Original cause of wound was Gradually Appeared. The wound is located on the Right T Great. The wound measures 0.5cm length x oe 0.7cm width x 0.2cm depth; 0.275cm^2 area and 0.055cm^3 volume. There is Fat Layer (Subcutaneous Tissue) exposed. There is no tunneling or undermining noted. There is a medium amount of serosanguineous drainage noted. The wound margin is flat and intact. There is large (67-100%) red, pink granulation within the wound bed. There is no necrotic tissue within the wound bed. Assessment Active Problems ICD-10 Type 1 diabetes mellitus with foot ulcer Non-pressure chronic ulcer of other part of right foot with fat layer exposed Type 1 diabetes mellitus with diabetic peripheral angiopathy without gangrene Type 1 diabetes mellitus with diabetic polyneuropathy Other chronic osteomyelitis, right ankle and  foot Plan Follow-up Appointments: Return Appointment in 2 weeks. Dressing Change Frequency: Wound #1 Right T Great: oe Change dressing every day. Wound Cleansing: Wound #1 Right T Great: oe May shower and wash wound with soap and water. Primary Wound Dressing: Wound #1 Right T Great: oe Hydrofera Blue - Classic Secondary Dressing: Wound #1 Right T Great: oe Foam - felt callous pad Kerlix/Rolled Gauze Dry Gauze #1 continue with Hydrofera Blue 2. The patient appears to be making decent progress. I think we would be better off we can offload this more aggressively but there are no options 3. He is completing his antibiotics I see no reason to add anything further. The CT scan showed osteomyelitis in the distal phalanx. I have given him 6 weeks of Levaquin and 2 weeks of Flagyl we will watch this. Electronic Signature(s) Signed: 06/03/2020 5:03:49 PM By: Baltazar Najjar MD Entered By: Baltazar Najjar on 06/03/2020 09:02:37 -------------------------------------------------------------------------------- SuperBill Details Patient Name: Date of Service: [name] 06/03/2020 Medical Record Number: [id number] Patient Account Number: [id number] Date of Birth/Sex: Treating RN: [birth year] (69 y.o. 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

## 2020-06-17 ENCOUNTER — Encounter (HOSPITAL_BASED_OUTPATIENT_CLINIC_OR_DEPARTMENT_OTHER): Payer: Medicare Other | Admitting: Internal Medicine

## 2020-06-17 ENCOUNTER — Other Ambulatory Visit: Payer: Self-pay

## 2020-06-17 DIAGNOSIS — E10621 Type 1 diabetes mellitus with foot ulcer: Secondary | ICD-10-CM | POA: Diagnosis not present

## 2020-06-17 NOTE — Progress Notes (Signed)
[name], [name] ([id number]) Visit Report for 06/17/2020 HPI Details Patient Name: Date of Service: [name], [name] 06/17/2020 8:00 A M Medical Record Number: [id number] Patient Account Number: [id number] Date of Birth/Sex: Treating RN: Mar 16, 1951 (69 y.o. 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 He is a type I diabetic on an insulin pump diagnosed at age 69. He also has known peripheral neuropathy. He has been dealing with a wound on his right first toe since at least February by review of epic. He was seen by Dr. Lajoyce Corners. Diagnosed with an ischemic wound sent to see Dr. Myra Gianotti him who noted a noncompressible ABI 1.67 and a TBI of 0.25 monophasic waveforms. On 11/15/2019 he underwent a right femoral to below-knee popliteal bypass with an ipsilateral saphenous vein. He also underwent extensive endarterectomy I believe that the external iliac. Postoperative ABI was 1.16 he has been using Neosporin on this and gradually making progress according to his companion. He is offloading this with a hole in the inserts in his work shoes. Per his companion who does the dressings the wound has been getting smaller Past medical history; type 1 diabetes with PAD and PN, history of a left BKA in 2017, coronary artery disease, hypercholesterolemia, hypertension he is a smoker 6/29; this is a patient I have not seen in 4 weeks. He is a type I diabetic on an insulin pump with a prior left BKA. He was revascularized before he came to our clinic with a right femoral to below-knee popliteal bypass with a saphenous vein. He also had an extensive endarterectomy. We used Hydrofera Blue to this wound. 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  Wears work boots. 7/13; type I diabetic with an area on the plantar tip of his right great toe in the setting of a previous left BKA and extensive endarterectomy with a right femoral to below-knee popliteal bypass before he came to our clinic. We have been using Hydrofera Blue the wound is measuring small. 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 Previous left BKA. He is also had an extensive endarterectomy with a right femoral to below-knee popliteal bypass before he came here. We have been using Hydrofera Blue. Nice improvements in the wound which is small and superficial now. [facility] I think he made this on his own but we have talked about this last week. He has a hammer deformity of the toe. I also wondered if they could be helpful in modifying his foot wear. 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 For the next 3 weeks he is traveling with his wife and therefore will not be outdoors as much on his farm. 8/17; patient returns to clinic today with a much bigger  wound than what I was used to seeing 3 weeks ago. I have looked through his arterial studies from 7/12. At that point he had noncompressible ABIs at 1.72 biphasic waveforms at the PTA and monophasic waveforms at the dorsalis pedis. Unfortunately they did not do a TBI on the great toe because it was bandaged. The interpretation however suggested waveforms showed adequate perfusion. The patient states that he saw Dr. Myra Gianotti about a month ago, I will see if  I can pull this record. As noted previously the patient is a very active man works with a left below-knee prosthesis. The great toe has a bit of a hammer deformity to it. He is trying to offload this using modified insoles in his shoes i.e. cutting out a part of this to lift the toe off the bottom of his shoe. 8/23; culture grew abundant amount of Enterobacter and a few methicillin sensitive staph aureus. I empirically put him on Levaquin in response to this although we were not able to get a hold of him by phone to let them know that the antibiotic was at his pharmacy therefore he has not started it yet. I encouraged him to start today. The wound is larger, a lot of drainage. There is nothing that probes to bone however we are certainly not making any progress. He is attempting to modify his foot wear. 8/30; X-ray ORDERED last WEEK showed suggestion of osteomyelitis of the first tuft of the right great toe. I looked at this x-ray indeed it does look damaged. He reminds me that he has rods and screws in his left arm from remote trauma. He will not be a candidate for an MRI. Nevertheless I think it is important to be sure of the diagnosis here. The patient also has severe PAD. Amputation of parts of the total toe may be necessary if this does not heal. He started Levaquin last week. Using silver alginate on the wound 9/13; we still do not have a CT scan. He is completing 2 weeks of Levaquin and 2 weeks of Flagyl. Most of his complaints of side effects I think are related to the Flagyl but that can stop as of when he finishes his medications either later today or tomorrow. He tells me that he has been using Neosporin on this for the last week, thinking that the silver alginate causes drainage. I do not know told him I thought this would be unlikely. It looks like he is offloading this better 9/28. Very small improvement in measurements. Surface looks reasonably healthy. He has finished 4 weeks of  Levaquin and 2 weeks of Flagyl. He has a CT scan on October 5. Change the dressing to [facility] today 10/4; dimensions about the same however the wound looks very healthy. We are using Hydrofera Blue. Felt offloading. 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 He has 1 more week of Levaquin CT scan tomorrow 10/11; wound is contracting surface looks very healthy we have been using Hydrofera Blue His CT scan showed osteomyelitis of the distal tuft of the great toe this is what I thought it would show fortunately does not show any damage beyond what I thought. Also noted that she he had advanced neuropathic change at the Lisfranc joint with osteophytes and heterotrophic calcification. There was no evidence of soft tissue emphysema. He is completing his Levaquin I gave him 2 weeks of Flagyl as well I have told him I think we should wait and see now. If we can offload  this enough for him to heal. we will watch 10/25; dimensions of the wound are down dramatically. Epithelialization however the epithelialization still looks somewhat vulnerable. His wife says he has been up on the tractor recently which she thinks has offloaded the wound. They are also using a felt donut Electronic Signature(s) Signed: 06/17/2020 5:10:17 PM By: Baltazar Najjar MD Entered By: Baltazar Najjar on 06/17/2020 08:55:02 -------------------------------------------------------------------------------- Physical Exam Details Patient Name: Date of Service: [name]. 06/17/2020 8:00 A M Medical Record Number: [id number] Patient Account Number: [id number] Date of Birth/Sex: Treating RN: 1951-03-02 (69 y.o. [name] Primary Care Provider: [name] Other Clinician: Referring Provider: Treating Provider/Extender: Lacretia Leigh in Treatment: 20 Constitutional Patient is hypertensive.. Pulse regular and within target range for patient.Marland Kitchen Respirations regular, non-labored and within target range..  Temperature is normal and within the target range for the patient.Marland Kitchen Appears in no distress. Cardiovascular Pedal pulses are palpable. Notes Wound exam; right plantar great toe distal. The wound is come down dramatically. Surface looks healthy. Surrounding epithelialization still looks somewhat vulnerable i.e. not mature however everything is better here. There is no evidence of infection Electronic Signature(s) Signed: 06/17/2020 5:10:17 PM By: Baltazar Najjar MD Entered By: Baltazar Najjar on 06/17/2020 40:98:11 -------------------------------------------------------------------------------- Physician Orders Details Patient Name: Date of Service: [name]. 06/17/2020 8:00 A M Medical Record Number: [id number] Patient Account Number: [id number] Date of Birth/Sex: Treating RN: 11-Jun-1951 (69 y.o. 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 Dressing Change Frequency Wound #1 Right T Great oe Change dressing every day. Wound Cleansing Wound #1 Right T Great oe May shower and wash wound with soap and water. Primary Wound Dressing Wound #1 Right T Great oe Hydrofera Blue - Classic Secondary Dressing Wound #1 Right T Great oe Foam - felt callous pad Kerlix/Rolled Gauze Dry Gauze Electronic Signature(s) Signed: 06/17/2020 5:02:41 PM By: Zandra Abts RN, BSN Signed: 06/17/2020  5:10:17 PM By: Baltazar Najjar MD Entered By: Zandra Abts on 06/17/2020 08:32:30 -------------------------------------------------------------------------------- Problem List Details Patient Name: Date of Service: [name]. 06/17/2020 8:00 A M Medical Record Number: [id number] Patient Account Number: [id number] Date of Birth/Sex: Treating RN: 02-22-1951 (68 y.o. 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 He is a type I diabetic on an insulin pump diagnosed at age 89. He also has known  peripheral neuropathy. He has been dealing with a wound on his right first toe since at least February by review of epic. He was seen by Dr. Lajoyce Cornersuda. Diagnosed with an ischemic wound sent to see Dr. Myra GianottiBrabham him who noted a noncompressible ABI 1.67 and a TBI of 0.25 monophasic waveforms. On 11/15/2019 he underwent a right femoral to below-knee popliteal bypass with an ipsilateral saphenous vein. He also underwent extensive endarterectomy I believe that the external iliac. Postoperative ABI was 1.16 he has been using Neosporin on this and gradually making progress according to his companion. He is offloading this with a hole in the inserts in his work shoes. Per his companion who does the dressings the wound has been getting smaller Past medical history; type 1 diabetes with PAD and PN, history of a left BKA in 2017, coronary artery disease, hypercholesterolemia, hypertension he is a smoker 6/29; this is a patient I have not seen in 4 weeks. He is a type I diabetic on an insulin pump with a prior left BKA. He was revascularized before he came to our clinic with a right femoral to below-knee popliteal bypass with a saphenous vein. He also had an extensive endarterectomy. We used Hydrofera Blue to this wound. 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 Wears work boots. 7/13; type I diabetic with an area on the plantar tip of his right great toe in the setting of a previous left BKA and extensive endarterectomy with a right femoral to below-knee popliteal bypass before he came to our clinic. We have been using Hydrofera Blue the wound is measuring small. 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 Previous left BKA. He is also had an extensive endarterectomy with a right femoral to below-knee popliteal bypass before he came here. We have been using Hydrofera Blue. Nice  improvements in the wound which is small and superficial now. [facility]. I think he made this on his own but we have talked about this last week. He has a hammer deformity of the toe. I also wondered if they could be helpful in modifying his foot wear. 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 For the next 3 weeks he is traveling with his wife and therefore will not be outdoors as much on his farm. 8/17; patient returns to clinic today with a much bigger wound than what I was used to seeing 3 weeks ago. I have looked through his arterial studies from 7/12. At that point he had noncompressible ABIs at 1.72 biphasic waveforms at the  PTA and monophasic waveforms at the dorsalis pedis. Unfortunately they did not do a TBI on the great toe because it was bandaged. The interpretation however suggested waveforms showed adequate perfusion. The patient states that he saw Dr. Myra Gianotti about a month ago, I will see if I can pull this record. As noted previously the patient is a very active man works with a left below-knee prosthesis. The great toe has a bit of a hammer deformity to it. He is trying to offload this using modified insoles in his shoes i.e. cutting out a part of this to lift the toe off the bottom of his shoe. 8/23; culture grew abundant amount of Enterobacter and a few methicillin sensitive staph aureus. I empirically put him on Levaquin in response to this although we were not able to get a hold of him by phone to let them know that the antibiotic was at his pharmacy therefore he has not started it yet. I encouraged him to start today. The wound is  larger, a lot of drainage. There is nothing that probes to bone however we are certainly not making any progress. He is attempting to modify his foot wear. 8/30; X-ray ORDERED last WEEK showed suggestion of osteomyelitis of the first tuft of the right great toe. I looked at this x-ray indeed it does look damaged. He reminds me that he has rods and screws in his left arm from remote trauma. He will not be a candidate for an MRI. Nevertheless I think it is important to be sure of the diagnosis here. The patient also has severe PAD. Amputation of parts of the total toe may be necessary if this does not heal. He started Levaquin last week. Using silver alginate on the wound 9/13; we still do not have a CT scan. He is completing 2 weeks of Levaquin and 2 weeks of Flagyl. Most of his complaints of side effects I think are related to the Flagyl but that can stop as of when he finishes his medications either later today or tomorrow. He tells me that he has been using Neosporin on this for the last week, thinking that the silver alginate causes drainage. I do not know told him I thought this would be unlikely. It looks like he is offloading this better 9/28. Very small improvement in measurements. Surface looks reasonably healthy. He has finished 4 weeks of Levaquin and 2 weeks of Flagyl. He has a CT scan on October 5. Change the dressing to [employer] today 10/4; dimensions about the same however the wound looks very healthy. We are using Hydrofera Blue. Felt offloading. 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 He has 1 more week of Levaquin CT scan tomorrow 10/11; wound is contracting surface looks very healthy we have been using Hydrofera Blue His CT scan showed osteomyelitis of the distal tuft of the great toe this is what I thought it would show fortunately does not show any damage beyond what I thought. Also noted that she he had advanced neuropathic change at the Lisfranc joint with osteophytes and  heterotrophic calcification. There was no evidence of soft tissue emphysema. He is completing his Levaquin I gave him 2 weeks of Flagyl as well I have told him I think we should wait and see now. If we can offload this enough for him to heal. we will watch 10/25; dimensions of the wound are down dramatically. Epithelialization however the epithelialization still looks somewhat vulnerable. His wife says he has been up on  the tractor recently which she thinks has offloaded the wound. They are also using a felt donut Objective Constitutional Patient is hypertensive.. Pulse regular and within target range for patient.Marland Kitchen Respirations regular, non-labored and within target range.. Temperature is normal and within the target range for the patient.Marland Kitchen Appears in no distress. Vitals Time Taken: 8:10 AM, Height: 71 in, Weight: 152 lbs, BMI: 21.2, Temperature: 97.8 F, Pulse: 62 bpm, Respiratory Rate: 18 breaths/min, Blood Pressure: 146/65 mmHg, Capillary Blood Glucose: 140 mg/dl. Cardiovascular Pedal pulses are palpable. General Notes: Wound exam; right plantar great toe distal. The wound is come down dramatically. Surface looks healthy. Surrounding epithelialization still looks somewhat vulnerable i.e. not mature however everything is better here. There is no evidence of infection Integumentary (Hair, Skin) Wound #1 status is Open. Original cause of wound was Gradually Appeared. The wound is located on the Right T Great. The wound measures 0.2cm length x oe 0.3cm width x 0.1cm depth; 0.047cm^2 area and 0.005cm^3 volume. There is Fat Layer (Subcutaneous Tissue) exposed. There is no tunneling or undermining noted. There is a medium amount of serosanguineous drainage noted. The wound margin is flat and intact. There is large (67-100%) red, pink granulation within the wound bed. There is no necrotic tissue within the wound bed. Assessment Active Problems ICD-10 Type 1 diabetes mellitus with foot  ulcer Non-pressure chronic ulcer of other part of right foot with fat layer exposed Type 1 diabetes mellitus with diabetic peripheral angiopathy without gangrene Type 1 diabetes mellitus with diabetic polyneuropathy Other chronic osteomyelitis, right ankle and foot Plan Follow-up Appointments: Return Appointment in 2 weeks. Dressing Change Frequency: Wound #1 Right T Great: oe Change dressing every day. Wound Cleansing: Wound #1 Right T Great: oe May shower and wash wound with soap and water. Primary Wound Dressing: Wound #1 Right T Great: oe Hydrofera Blue - Classic Secondary Dressing: Wound #1 Right T Great: oe Foam - felt callous pad Kerlix/Rolled Gauze Dry Gauze 1. I see absolutely no reason to change the primary dressing which is Hydrofera Blue 2. We are putting foam, felt and gauze around this 3. His wife is using a raised felt donut I think that is helping. 4. Follow-up in 2 weeks. With any luck this could be closed Electronic Signature(s) Signed: 06/17/2020 5:10:17 PM By: Baltazar Najjar MD Entered By: Baltazar Najjar on 06/17/2020 08:57:28 -------------------------------------------------------------------------------- SuperBill Details Patient Name: Date of Service: [name] 06/17/2020 Medical Record Number: [id number] Patient Account Number: [id number] Date of Birth/Sex: Treating RN: Sep 01, 1950 (69 y.o. [name] Primary Care Provider: [name] Other Clinician: Referring Provider: Treating Provider/Extender: Lacretia Leigh in Treatment: 20 Diagnosis Coding ICD-10 Codes Code Description [phone] Type 1 diabetes mellitus with foot ulcer L97.512 Non-pressure chronic ulcer of other part of right foot with fat layer exposed E10.51 Type 1 diabetes mellitus with diabetic peripheral angiopathy without gangrene E10.42 Type 1 diabetes mellitus with diabetic polyneuropathy M86.671 Other chronic osteomyelitis, right ankle and  foot Facility Procedures CPT4 Code: [id number] Description: 99213 - WOUND CARE VISIT-LEV 3 EST PT Modifier: Quantity: 1 Physician Procedures : CPT4 Code Description Modifier [id number] - WC PHYS LEVEL 3 - EST PT ICD-10 Diagnosis Description L97.512 Non-pressure chronic ulcer of other part of right foot with fat layer exposed E10.621 Type 1 diabetes mellitus with foot ulcer M86.671 Other  chronic osteomyelitis, right ankle and foot Quantity: 1 Electronic Signature(s) Signed: 06/17/2020 5:10:17 PM By: Baltazar Najjar MD Entered By: Baltazar Najjar on 06/17/2020 08:57:49

## 2020-06-17 NOTE — Progress Notes (Signed)
[name] ([id number]) Visit Report for 06/17/2020 Arrival Information Details Patient Name: Date of Service: [name], [name] 06/17/2020 8:00 A M Medical Record Number: [id number] Patient Account Number: [id number] Date of Birth/Sex: Treating RN: May 18, 1951 (69 y.o. Hessie Diener Primary Care Karmon Andis: [name] Other Clinician: Referring Damar Petit: Treating Brissia Delisa/Extender: [name] in Treatment: 63 Visit Information History Since Last Visit Added or deleted any medications: No Patient Arrived: Ambulatory Any new allergies or adverse reactions: No Arrival Time: 08:07 Had a fall or experienced change in No Accompanied By: wife activities of daily living that may affect Transfer Assistance: None risk of falls: Patient Identification Verified: Yes Signs or symptoms of abuse/neglect since last visito No Secondary Verification Process Completed: Yes Hospitalized since last visit: No Patient Requires Transmission-Based Precautions: No Has Dressing in Place as Prescribed: Yes Patient Has Alerts: Yes Has Compression in Place as Prescribed: Yes Patient Alerts: R ABI non compressible Pain Present Now: No Electronic Signature(s) Signed: 06/17/2020 5:04:57 PM By: Deon Pilling Entered By: Deon Pilling on 06/17/2020 08:08:31 -------------------------------------------------------------------------------- Clinic Level of Care Assessment Details Patient Name: Date of Service: [name] 06/17/2020 8:00 A M Medical Record Number: [id number] Patient Account Number: [id number] Date of Birth/Sex: Treating RN: 1950/08/28 (69 y.o. Janyth Contes Primary Care Remington Skalsky: [name] Other Clinician: Referring Kadeen Sroka: Treating Katura Eatherly/Extender: [name] in Treatment: 20 Clinic Level of Care Assessment Items TOOL 4 Quantity Score X- 1 0 Use when only an EandM is performed on FOLLOW-UP visit ASSESSMENTS - Nursing  Assessment / Reassessment X- 1 10 Reassessment of Co-morbidities (includes updates in patient status) X- 1 5 Reassessment of Adherence to Treatment Plan ASSESSMENTS - Wound and Skin A ssessment / Reassessment X - Simple Wound Assessment / Reassessment - one wound 1 5 []  - 0 Complex Wound Assessment / Reassessment - multiple wounds []  - 0 Dermatologic / Skin Assessment (not related to wound area) ASSESSMENTS - Focused Assessment []  - 0 Circumferential Edema Measurements - multi extremities []  - 0 Nutritional Assessment / Counseling / Intervention X- 1 5 Lower Extremity Assessment (monofilament, tuning fork, pulses) []  - 0 Peripheral Arterial Disease Assessment (using hand held doppler) ASSESSMENTS - Ostomy and/or Continence Assessment and Care []  - 0 Incontinence Assessment and Management []  - 0 Ostomy Care Assessment and Management (repouching, etc.) PROCESS - Coordination of Care X - Simple Patient / Family Education for ongoing care 1 15 []  - 0 Complex (extensive) Patient / Family Education for ongoing care X- 1 10 Staff obtains Programmer, systems, Records, T Results / Process Orders est []  - 0 Staff telephones HHA, Nursing Homes / Clarify orders / etc []  - 0 Routine Transfer to another Facility (non-emergent condition) []  - 0 Routine Hospital Admission (non-emergent condition) []  - 0 New Admissions / Biomedical engineer / Ordering NPWT Apligraf, etc. , []  - 0 Emergency Hospital Admission (emergent condition) X- 1 10 Simple Discharge Coordination []  - 0 Complex (extensive) Discharge Coordination PROCESS - Special Needs []  - 0 Pediatric / Minor Patient Management []  - 0 Isolation Patient Management []  - 0 Hearing / Language / Visual special needs []  - 0 Assessment of Community assistance (transportation, D/C planning, etc.) []  - 0 Additional assistance / Altered mentation []  - 0 Support Surface(s) Assessment (bed, cushion, seat, etc.) INTERVENTIONS - Wound  Cleansing / Measurement X - Simple Wound Cleansing - one wound 1 5 []  - 0 Complex Wound Cleansing - multiple wounds X- 1 5 Wound Imaging (photographs - any  number of wounds) $RemoveBe'[]'dxzdDPSup$  - 0 Wound Tracing (instead of photographs) X- 1 5 Simple Wound Measurement - one wound $RemoveB'[]'JjWmOHJa$  - 0 Complex Wound Measurement - multiple wounds INTERVENTIONS - Wound Dressings X - Small Wound Dressing one or multiple wounds 1 10 $Re'[]'dlz$  - 0 Medium Wound Dressing one or multiple wounds $RemoveBeforeD'[]'vfjLDxGADxyoyc$  - 0 Large Wound Dressing one or multiple wounds X- 1 5 Application of Medications - topical $RemoveB'[]'ZFrxWvOp$  - 0 Application of Medications - injection INTERVENTIONS - Miscellaneous $RemoveBeforeD'[]'WrXGffkUeMilIS$  - 0 External ear exam $Remove'[]'IQslkXw$  - 0 Specimen Collection (cultures, biopsies, blood, body fluids, etc.) $RemoveBefor'[]'eSBDEBIzyZqf$  - 0 Specimen(s) / Culture(s) sent or taken to Lab for analysis $RemoveBefo'[]'fUtJlKmKeCG$  - 0 Patient Transfer (multiple staff / Civil Service fast streamer / Similar devices) $RemoveBeforeDE'[]'kbcwjgVbEJskfiP$  - 0 Simple Staple / Suture removal (25 or less) $Remove'[]'CYPMGiA$  - 0 Complex Staple / Suture removal (26 or more) $Remove'[]'dVXFbsU$  - 0 Hypo / Hyperglycemic Management (close monitor of Blood Glucose) $RemoveBefore'[]'pZtYVLLZbLEEv$  - 0 Ankle / Brachial Index (ABI) - do not check if billed separately X- 1 5 Vital Signs Has the patient been seen at the hospital within the last three years: Yes Total Score: 95 Level Of Care: New/Established - Level 3 Electronic Signature(s) Signed: 06/17/2020 5:02:41 PM By: Levan Hurst RN, BSN Entered By: Levan Hurst on 06/17/2020 08:38:00 -------------------------------------------------------------------------------- Encounter Discharge Information Details Patient Name: Date of Service: [name] Leash. 06/17/2020 8:00 A M Medical Record Number: [id number] Patient Account Number: [id number] Date of Birth/Sex: Treating RN: [birth year] (69 y.o. Hessie Diener Primary Care Kinzi Frediani: [name] Other Clinician: Referring Celena Lanius: Treating Neah Sporrer/Extender: [name] in Treatment: 20 Encounter Discharge  Information Items Discharge Condition: Stable Ambulatory Status: Ambulatory Discharge Destination: Home Transportation: Private Auto Accompanied By: wife Schedule Follow-up Appointment: Yes Clinical Summary of Care: Electronic Signature(s) Signed: 06/17/2020 5:04:57 PM By: Deon Pilling Entered By: Deon Pilling on 06/17/2020 08:47:56 -------------------------------------------------------------------------------- Lower Extremity Assessment Details Patient Name: Date of Service: [name], [name] 06/17/2020 8:00 A M Medical Record Number: [id number] Patient Account Number: [id number] Date of Birth/Sex: Treating RN: 12-03-1950 (69 y.o. Hessie Diener Primary Care Betti Goodenow: [name] Other Clinician: Referring Nikitia Asbill: Treating Ryana Montecalvo/Extender: [name] in Treatment: 20 Edema Assessment Assessed: Shirlyn Goltz: No] Patrice Paradise: No] Edema: [Left: N] [Right: o] Calf Left: Right: Point of Measurement: From Medial Instep 33 cm Ankle Left: Right: Point of Measurement: From Medial Instep 21 cm Vascular Assessment Pulses: Dorsalis Pedis Palpable: [Right:Yes] Electronic Signature(s) Signed: 06/17/2020 5:04:57 PM By: Deon Pilling Entered By: Deon Pilling on 06/17/2020 08:09:48 -------------------------------------------------------------------------------- Multi Wound Chart Details Patient Name: Date of Service: [name] Leash. 06/17/2020 8:00 A M Medical Record Number: [id number] Patient Account Number: [id number] Date of Birth/Sex: Treating RN: 10/05/1950 (69 y.o. Janyth Contes Primary Care Marisella Puccio: [name] Other Clinician: Referring Antwion Carpenter: Treating Demaryius Imran/Extender: [name] in Treatment: 20 Vital Signs Height(in): 71 Capillary Blood Glucose(mg/dl): 140 Weight(lbs): 152 Pulse(bpm): 22 Body Mass Index(BMI): 21 Blood Pressure(mmHg): 146/65 Temperature(F): 97.8 Respiratory Rate(breaths/min): 18 Photos:  [1:No Photos Right T Great oe] [N/A:N/A N/A] Wound Location: [1:Gradually Appeared] [N/A:N/A] Wounding Event: [1:Diabetic Wound/Ulcer of the Lower] [N/A:N/A] Primary Etiology: [1:Extremity Cataracts, Coronary Artery Disease, N/A] Comorbid History: [1:Peripheral Arterial Disease, Type I Diabetes, Osteomyelitis, Neuropathy 11/23/2019] [N/A:N/A] Date Acquired: [1:20] [N/A:N/A] Weeks of Treatment: [1:Open] [N/A:N/A] Wound Status: [1:0.2x0.3x0.1] [N/A:N/A] Measurements L x W x D (cm) [1:0.047] [N/A:N/A] A (cm) : rea [1:0.005] [N/A:N/A] Volume (cm) : [1:60.20%] [N/A:N/A] % Reduction in A rea: [1:58.30%] [N/A:N/A] % Reduction in Volume: [1:Grade  2] [N/A:N/A] Classification: [1:Medium] [N/A:N/A] Exudate A mount: [1:Serosanguineous] [N/A:N/A] Exudate Type: [1:red, brown] [N/A:N/A] Exudate Color: [1:Flat and Intact] [N/A:N/A] Wound Margin: [1:Large (67-100%)] [N/A:N/A] Granulation A mount: [1:Red, Pink] [N/A:N/A] Granulation Quality: [1:None Present (0%)] [N/A:N/A] Necrotic A mount: [1:Fat Layer (Subcutaneous Tissue): Yes N/A] Exposed Structures: [1:Fascia: No Tendon: No Muscle: No Joint: No Bone: No Large (67-100%)] [N/A:N/A] Treatment Notes Wound #1 (Right Toe Great) 1. Cleanse With Wound Cleanser 2. Periwound Care Skin Prep 3. Primary Dressing Applied Hydrofera Blue 4. Secondary Dressing Dry Gauze Roll Gauze 5. Secured With Medipore tape 7. Footwear/Offloading device applied Felt/Foam Notes netting Electronic Signature(s) Signed: 06/17/2020 5:02:41 PM By: Zandra Abts RN, BSN Signed: 06/17/2020 5:10:17 PM By: Baltazar Najjar MD Entered By: Baltazar Najjar on 06/17/2020 08:51:56 -------------------------------------------------------------------------------- Multi-Disciplinary Care Plan Details Patient Name: Date of Service: [name] Boston. 06/17/2020 8:00 A M Medical Record Number: [id number] Patient Account Number: [id number] Date of Birth/Sex: Treating RN: 07-24-1951  (69 y.o. [name] Primary Care Alwyn Cordner: [name] Other Clinician: Referring Quintara Bost: Treating Sparrow Sanzo/Extender: Lacretia Leigh in Treatment: 20 Active Inactive Wound/Skin Impairment Nursing Diagnoses: Knowledge deficit related to ulceration/compromised skin integrity Goals: Patient/caregiver will verbalize understanding of skin care regimen Date Initiated: 01/23/2020 Target Resolution Date: 07/19/2020 Goal Status: Active Ulcer/skin breakdown will have a volume reduction of 30% by week 4 Date Initiated: 01/23/2020 Date Inactivated: 03/05/2020 Target Resolution Date: 02/22/2020 Goal Status: Met Ulcer/skin breakdown will have a volume reduction of 50% by week 8 Date Initiated: 03/05/2020 Date Inactivated: 04/02/2020 Target Resolution Date: 03/24/2020 Goal Status: Unmet Unmet Reason: comorbities Ulcer/skin breakdown will have a volume reduction of 80% by week 12 Date Initiated: 04/02/2020 Date Inactivated: 04/22/2020 Target Resolution Date: 04/24/2020 Goal Status: Unmet Unmet Reason: Osteomyelitis Interventions: Assess patient/caregiver ability to obtain necessary supplies Assess patient/caregiver ability to perform ulcer/skin care regimen upon admission and as needed Assess ulceration(s) every visit Notes: Electronic Signature(s) Signed: 06/17/2020 5:02:41 PM By: Zandra Abts RN, BSN Entered By: Zandra Abts on 06/17/2020 08:32:40 -------------------------------------------------------------------------------- Pain Assessment Details Patient Name: Date of Service: [name] Boston 06/17/2020 8:00 A M Medical Record Number: [id number] Patient Account Number: [id number] Date of Birth/Sex: Treating RN: 01/24/1951 (69 y.o. Tammy Sours Primary Care Momoko Slezak: [name] Other Clinician: Referring Calais Svehla: Treating Telesia Ates/Extender: Lacretia Leigh in Treatment: 20 Active Problems Location of Pain Severity and Description  of Pain Patient Has Paino No Site Locations Rate the pain. Current Pain Level: 0 Pain Management and Medication Current Pain Management: Medication: No Cold Application: No Rest: No Massage: No Activity: No T.E.N.S.: No Heat Application: No Leg drop or elevation: No Is the Current Pain Management Adequate: Adequate How does your wound impact your activities of daily livingo Sleep: No Bathing: No Appetite: No Relationship With Others: No Bladder Continence: No Emotions: No Bowel Continence: No Work: No Toileting: No Drive: No Dressing: No Hobbies: No Electronic Signature(s) Signed: 06/17/2020 5:04:57 PM By: Shawn Stall Entered By: Shawn Stall on 06/17/2020 08:09:18 -------------------------------------------------------------------------------- Patient/Caregiver Education Details Patient Name: Date of Service: [name] Boston 10/25/2021andnbsp8:00 A M Medical Record Number: [id number] Patient Account Number: [id number] Date of Birth/Gender: Treating RN: 1950-12-03 (69 y.o. [name] Primary Care Physician: [name] Other Clinician: Referring Physician: Treating Physician/Extender: Lacretia Leigh in Treatment: 20 Education Assessment Education Provided To: Patient Education Topics Provided Wound/Skin Impairment: Methods: Explain/Verbal Responses: State content correctly [employer]) Signed: 06/17/2020 5:02:41 PM By: Zandra Abts RN, BSN Entered By: Zandra Abts on 06/17/2020 08:33:05 -------------------------------------------------------------------------------- Wound  Assessment Details Patient Name: Date of Service: [name], [name] 06/17/2020 8:00 A M Medical Record Number: [id number] Patient Account Number: [id number] Date of Birth/Sex: Treating RN: 25-Feb-1951 (69 y.o. Lorette Ang, Meta.Reding Primary Care Leeman Johnsey: [name] Other Clinician: Referring Jahlia Omura: Treating Keyshun Elpers/Extender: [name] in Treatment: 20 Wound Status Wound Number: 1 Primary Diabetic Wound/Ulcer of the Lower Extremity Etiology: Wound Location: Right T Great oe Wound Open Wounding Event: Gradually Appeared Status: Date Acquired: 11/23/2019 Comorbid Cataracts, Coronary Artery Disease, Peripheral Arterial Disease, Weeks Of Treatment: 20 History: Type I Diabetes, Osteomyelitis, Neuropathy Clustered Wound: No Wound Measurements Length: (cm) 0.2 Width: (cm) 0.3 Depth: (cm) 0.1 Area: (cm) 0.047 Volume: (cm) 0.005 % Reduction in Area: 60.2% % Reduction in Volume: 58.3% Epithelialization: Large (67-100%) Tunneling: No Undermining: No Wound Description Classification: Grade 2 Wound Margin: Flat and Intact Exudate Amount: Medium Exudate Type: Serosanguineous Exudate Color: red, brown Foul Odor After Cleansing: No Slough/Fibrino No Wound Bed Granulation Amount: Large (67-100%) Exposed Structure Granulation Quality: Red, Pink Fascia Exposed: No Necrotic Amount: None Present (0%) Fat Layer (Subcutaneous Tissue) Exposed: Yes Tendon Exposed: No Muscle Exposed: No Joint Exposed: No Bone Exposed: No Treatment Notes Wound #1 (Right Toe Great) 1. Cleanse With Wound Cleanser 2. Periwound Care Skin Prep 3. Primary Dressing Applied Hydrofera Blue 4. Secondary Dressing Dry Gauze Roll Gauze 5. Secured With Medipore tape 7. Footwear/Offloading device applied Felt/Foam Notes netting Electronic Signature(s) Signed: 06/17/2020 5:04:57 PM By: Deon Pilling Entered By: Deon Pilling on 06/17/2020 08:11:50 -------------------------------------------------------------------------------- Vitals Details Patient Name: Date of Service: [name] Leash. 06/17/2020 8:00 A M Medical Record Number: [id number] Patient Account Number: [id number] Date of Birth/Sex: Treating RN: [birth year] (69 y.o. Hessie Diener Primary Care Nicolaas Savo: [name] Other Clinician: Referring  Talmage Teaster: Treating Amulya Quintin/Extender: [name] in Treatment: 20 Vital Signs Time Taken: 08:10 Temperature (F): 97.8 Height (in): 71 Pulse (bpm): 62 Weight (lbs): 152 Respiratory Rate (breaths/min): 18 Body Mass Index (BMI): 21.2 Blood Pressure (mmHg): 146/65 Capillary Blood Glucose (mg/dl): 140 Reference Range: 80 - 120 mg / dl Electronic Signature(s) Signed: 06/17/2020 5:04:57 PM By: Deon Pilling Entered By: Deon Pilling on 06/17/2020 08:09:09

## 2020-06-18 ENCOUNTER — Encounter: Payer: Self-pay | Admitting: Cardiology

## 2020-06-18 ENCOUNTER — Other Ambulatory Visit: Payer: Self-pay

## 2020-06-18 ENCOUNTER — Ambulatory Visit: Payer: Medicare Other | Admitting: Cardiology

## 2020-06-18 VITALS — BP 143/64 | HR 68 | Resp 15 | Ht 71.0 in | Wt 162.0 lb

## 2020-06-18 DIAGNOSIS — I10 Essential (primary) hypertension: Secondary | ICD-10-CM

## 2020-06-18 DIAGNOSIS — I2510 Atherosclerotic heart disease of native coronary artery without angina pectoris: Secondary | ICD-10-CM

## 2020-06-18 DIAGNOSIS — Z720 Tobacco use: Secondary | ICD-10-CM

## 2020-06-18 DIAGNOSIS — I6523 Occlusion and stenosis of bilateral carotid arteries: Secondary | ICD-10-CM

## 2020-06-18 DIAGNOSIS — I739 Peripheral vascular disease, unspecified: Secondary | ICD-10-CM

## 2020-06-18 DIAGNOSIS — Z89512 Acquired absence of left leg below knee: Secondary | ICD-10-CM

## 2020-06-18 MED ORDER — AMLODIPINE BESYLATE 10 MG PO TABS: 10.0000 mg | ORAL_TABLET | Freq: Every day | ORAL | 3 refills | Status: DC

## 2020-06-18 NOTE — Progress Notes (Signed)
Primary Physician/Referring:  [name], MD  Patient ID: [name], male    DOB: Feb 26, 1951, 69 y.o.   MRN: [id number]  Chief Complaint  Patient presents with  . Follow-up    1 year  . PAD  . Coronary Artery Disease   HPI:    [name]  is a 69 y.o. caucasian male current smoker with history of type 1 diabetes mellitus with insulin pump, hyperlipidemia, coronary artery disease and had non-ST elevation myocardial infarction,  moderate diffuse scattered coronary artery disease. Last stress test in 2015 revealed no ischemia and normal LV systolic function.   On 07/31/2014, underwent successful revascularization of the right SFA. 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  On 11/15/2019 underwent successful right leg bypass surgery and extensive endarterectomy of the right distal external iliac, common femoral, and profundofemoral artery. In regard to hyperlipidemia he has been unable to tolerate atorvastatin, simvastatin, or pravastatin in the past, but is currently tolerating rosuvastatin.  Patient presents for annual follow-up and is presently doing well.  Since last visit on 11/15/2019 patient underwent right femoral to below-knee popliteal artery bypass graft with ipsilateral translocated saphenous vein and extensive endarterectomy of the right distal external iliac, common femoral, and profundofemoral artery. He reports a chronic ulcer on his right great toe for which he follows with wound care weekly, states it is now healing well.   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 Unfortunately he continues to smoke a pack per day. He does not record his blood pressure at home on a regular basis.   Past Medical History:  Diagnosis Date  . Cigarette smoker   . Coronary artery disease   . Diabetes mellitus    type 1  x 50 yrs  . Diabetic retinopathy ([place])   .  Dyslipidemia   . Hypertension   . Peripheral vascular disease [facility])    Past Surgical History:  Procedure Laterality Date  . ABDOMINAL ANGIOGRAM N/A 05/29/2014   Procedure: ABDOMINAL ANGIOGRAM;  Surgeon: Laverda Page, MD;  Location: [facility] CATH LAB;  Service: Cardiovascular;  Laterality: N/A;  . ABDOMINAL AORTOGRAM W/LOWER EXTREMITY N/A 11/14/2019   Procedure: ABDOMINAL AORTOGRAM W/LOWER EXTREMITY;  Surgeon: Serafina Mitchell, MD;  Location: [place] CV LAB;  Service: Cardiovascular;  Laterality: N/A;  . AMPUTATION Left 10/25/2015   Procedure: Left Foot 5th Ray Amputation;  Surgeon: Newt Minion, MD;  Location: [place];  Service: Orthopedics;  Laterality: Left;  . AMPUTATION Left 12/06/2015   Procedure: AMPUTATION BELOW KNEE;  Surgeon: Newt Minion, MD;  Location: [place];  Service: Orthopedics;  Laterality: Left;  . CARDIAC CATHETERIZATION     EF is 55-60% and no wall motion abnormalities (long long time ago)  . FEMORAL-POPLITEAL BYPASS GRAFT Left 10/24/2015   Procedure: BYPASS GRAFT FEMORAL below knee POPLITEAL ARTERY with Left Saphenous Vein;  Surgeon: Serafina Mitchell, MD;  Location: [place];  Service: Vascular;  Laterality: Left;  . FEMORAL-POPLITEAL BYPASS GRAFT Right 11/15/2019   Procedure: BYPASS GRAFT FEMORAL-POPLITEAL ARTERY using Right Leg Greater Saphenous Vein;  Surgeon: Serafina Mitchell, MD;  Location: [place];  Service: Vascular;  Laterality: Right;  . FRACTURE SURGERY     left arm "many yrs ago"  . LOWER EXTREMITY ANGIOGRAM N/A 01/23/2014   Procedure: LOWER EXTREMITY ANGIOGRAM;  Surgeon: Laverda Page, MD;  Location: [facility] CATH LAB;  Service: Cardiovascular;  Laterality: N/A;  . LOWER EXTREMITY ANGIOGRAM N/A 07/31/2014   Procedure: LOWER EXTREMITY ANGIOGRAM;  Surgeon: Laverda Page, MD;  Location: [facility] CATH LAB;  Service: Cardiovascular;  Laterality: N/A;  . LOWER EXTREMITY ANGIOGRAM Left 10/11/2015   Procedure: Lower Extremity Angiogram;  Surgeon: Serafina Mitchell, MD;  Location: [place] CV LAB;  Service: Cardiovascular;  Laterality: Left;  . PERIPHERAL VASCULAR BALLOON ANGIOPLASTY  11/14/2019   Procedure: PERIPHERAL VASCULAR BALLOON ANGIOPLASTY;  Surgeon: Serafina Mitchell, MD;  Location: [place] CV LAB;  Service: Cardiovascular;;  . PERIPHERAL VASCULAR CATHETERIZATION Left 10/11/2015   Procedure: Peripheral Vascular Balloon Angioplasty;  Surgeon: Serafina Mitchell, MD;  Location: [place] CV LAB;  Service: Cardiovascular;  Laterality: Left;  sfa failed unable to cross occluded sfa  . PERIPHERAL VASCULAR CATHETERIZATION N/A 10/11/2015   Procedure: Abdominal Aortogram;  Surgeon: Serafina Mitchell, MD;  Location: [place] CV LAB;  Service: Cardiovascular;  Laterality: N/A;  . STUMP REVISION Left 03/02/2016   Procedure: Revision Left Below Knee Amputation;  Surgeon: Newt Minion, MD;  Location: [place];  Service: Orthopedics;  Laterality: Left;   Family History  Problem Relation Age of Onset  . Coronary artery disease Mother     Social History   Tobacco Use  . Smoking status: Current Every Day Smoker    Packs/day: 1.00    Years: 30.00    Pack years: 30.00    Types: Cigarettes  . Smokeless tobacco: Never Used  Substance Use Topics  . Alcohol use: Yes    Comment: on occasion wine   Marital Status: Widowed   ROS  Review of Systems  Constitutional: Negative for malaise/fatigue and weight gain.  Cardiovascular: Negative for chest pain, claudication, leg swelling, near-syncope, orthopnea, palpitations, paroxysmal nocturnal dyspnea and syncope.  Respiratory: Positive for cough (chronic) and shortness of breath (stable).   Hematologic/Lymphatic: Does not bruise/bleed easily.  Musculoskeletal: Positive for muscle cramps (right leg at night).  Gastrointestinal: Negative for melena.  Neurological: Negative for dizziness and weakness.    Objective  Blood pressure (!) 143/64, pulse 68, resp. rate 15, height $RemoveBe'5\' 11"'sYbipRvPA$  (1.803 m), weight 162 lb (73.5 kg), SpO2 97 %.    Vitals with BMI 06/18/2020 03/04/2020 12/26/2019  Height $Remov'5\' 11"'tjHCHf$  $RemoveB'5\' 11"'DfTzgdcq$  $RemoveBef'5\' 11"'pWlDXWbvLY$   Weight 162 lbs 152 lbs 2 oz 156 lbs  BMI 22.6 96.75 91.63  Systolic 846 659 -  Diastolic 64 69 -  Pulse 68 59 -      Physical Exam Vitals reviewed.  Constitutional:      Comments: Appears older than stated age  HENT:     Head: Normocephalic and atraumatic.  Cardiovascular:     Rate and Rhythm: Normal rate and regular rhythm.     Pulses: Intact distal pulses.          Carotid pulses are 2+ on the right side and 2+ on the left side.      Radial pulses are 2+ on the right side and 2+ on the left side.       Femoral pulses are 2+ on the right side and 2+ on the left side.      Popliteal pulses are 0 on the right side. Left popliteal pulse not accessible.       Dorsalis pedis pulses are 0 on the right side. Left dorsalis pedis pulse not accessible.       Posterior tibial pulses are 0 on the right side. Left posterior tibial pulse not accessible.  Heart sounds: S1 normal and S2 normal. No murmur heard.  No gallop.      Comments: Capillary refill normal right leg. Left BKA. No edema. No JVD Pulmonary:     Effort: Pulmonary effort is normal. No respiratory distress.     Breath sounds: No wheezing, rhonchi or rales.  Musculoskeletal:     Right lower leg: No edema.     Left lower leg: No edema.  Skin:    Comments: Unable to evaluate right great toe ulcer as dressings are intact.   Neurological:     Mental Status: He is alert.     Laboratory examination:   Recent Labs    11/15/19 0258 11/15/19 0258 11/16/19 0248 05/24/20 1040 05/28/20 1221  NA 134*  --  136 135  --   K 4.9  --  4.2 4.7  --   CL 105  --  108 98  --   CO2 22  --  22 25  --   GLUCOSE 257*  --  98 161*  --   BUN 21  --  15 20  --   CREATININE 1.00   < > 0.87 0.90 0.80  CALCIUM 8.4*  --  7.8* 9.4  --   GFRNONAA >60  --  >60 >60  --   GFRAA >60  --  >60 >60  --    < > = values in this interval not displayed.   CrCl cannot be  calculated (Patient's most recent lab result is older than the maximum 21 days allowed.).  CMP Latest Ref Rng & Units 05/28/2020 05/24/2020 11/16/2019  Glucose 70 - 99 mg/dL - 836(D) 98  BUN 8 - 23 mg/dL - 20 15  Creatinine 2.55 - 1.24 mg/dL 0.01 6.42 9.03  Sodium 135 - 145 mmol/L - 135 136  Potassium 3.5 - 5.1 mmol/L - 4.7 4.2  Chloride 98 - 111 mmol/L - 98 108  CO2 22 - 32 mmol/L - 25 22  Calcium 8.9 - 10.3 mg/dL - 9.4 7.9(D)  Total Protein 6.5 - 8.1 g/dL - - -  Total Bilirubin 0.3 - 1.2 mg/dL - - -  Alkaline Phos 25 - 125 U/L - - -  AST 14 - 40 U/L - - -  ALT 10 - 40 U/L - - -   CBC Latest Ref Rng & Units 11/16/2019 11/15/2019 11/14/2019  WBC 4.0 - 10.5 K/uL 8.1 8.2 -  Hemoglobin 13.0 - 17.0 g/dL 5.8(P) 11.5(L) 13.6  Hematocrit 39 - 52 % 27.5(L) 33.5(L) 40.0  Platelets 150 - 400 K/uL 262 350 -    Lipid Panel No results for input(s): CHOL, TRIG, LDLCALC, VLDL, HDL, CHOLHDL, LDLDIRECT in the last 8760 hours.  HEMOGLOBIN A1C Lab Results  Component Value Date   HGBA1C 8.0 (H) 11/14/2019   MPG 182.9 11/14/2019   TSH No results for input(s): TSH in the last 8760 hours.  External labs:  05/24/2020: BUN 20, creatinine 0.8, EGFR 60, CrCl 91.06  03/22/2020: A1c 7.8%  07/17/2019: HDL 58, LDL 58, total cholesterol 167, triglycerides 29  07/15/2018: TSH 1.1   Medications and allergies   Allergies  Allergen Reactions  . Simvastatin Other (See Comments)    MYALGIAS, MUSCLE WEAKNESS      Outpatient Medications Prior to Visit  Medication Sig Dispense Refill  . acetaminophen (TYLENOL) 500 MG tablet Take 1,000 mg by mouth 2 (two) times daily as needed (pain).     Marland Kitchen aspirin EC 81 MG tablet Take 243 mg by  mouth daily.     . baclofen (LIORESAL) 20 MG tablet Take 20 mg by mouth daily as needed (for leg cramps). Reported on 12/05/2015  0  . calcium carbonate (TUMS - DOSED IN MG ELEMENTAL CALCIUM) 500 MG chewable tablet Chew 2 tablets by mouth every 6 (six) hours as needed for  indigestion or heartburn.    . folic acid (FOLVITE) 132 MCG tablet Take 1,600 mcg by mouth daily.     Marland Kitchen HUMALOG 100 UNIT/ML injection PUMP    . metoprolol succinate (TOPROL-XL) 25 MG 24 hr tablet Take 1 tablet (25 mg total) by mouth daily. 30 tablet 1  . Multiple Vitamins-Minerals (MULTIVITAMIN GUMMIES ADULT PO) Take 2 tablets by mouth daily.     Marland Kitchen NOVOLOG 100 UNIT/ML injection Inject into the skin continuous. Via insulin pump  0  . ONETOUCH ULTRA test strip     . ramipril (ALTACE) 10 MG tablet Take 10 mg by mouth daily.      . rosuvastatin (CRESTOR) 10 MG tablet TAKE 1 TABLET BY MOUTH  DAILY (Patient taking differently: Take 10 mg by mouth daily. ) 90 tablet 3  . amLODipine (NORVASC) 5 MG tablet TAKE 1 TABLET BY MOUTH  DAILY (Patient taking differently: Take 5 mg by mouth daily. ) 90 tablet 3  . oxyCODONE (OXY IR/ROXICODONE) 5 MG immediate release tablet Take 1 tablet (5 mg total) by mouth every 6 (six) hours as needed for moderate pain. (Patient not taking: Reported on 06/18/2020) 30 tablet 0  . sulfamethoxazole-trimethoprim (BACTRIM DS) 800-160 MG tablet Take 1 tablet by mouth 2 (two) times daily. (Patient not taking: Reported on 06/18/2020)     No facility-administered medications prior to visit.     Radiology:   No results found.  Cardiac Studies:   Coronary Angiogram 11/18/2004: Mild diffuse luminal irregularity, 40% stenosis in the proximal RCA, 30-40% in the mid and 50-60% in the distal. EF 55-60%.  Lower extremity arterial duplex 08/14/2013 ([facility]): Severe bilateral diffuse fibrocalcific plaque, right mid SFA occluded and is collateralized distally. High-grade stenosis in the left proximal popliteal artery. Cannot exclude left distal SFA/popliteal artery occlusion. ABI on the left 1.23, right was noncompressible.  Lexiscan sestamibi stress test 10/23/2013: 1. Resting EKG NSR, early repolarization, stress EKG was non-diagnostic for ischemia. No ST-T changes of ischemia noted with  pharmacologic stress testing. Stress symptoms included lightheadedness. Stress terminated due to completion of protocol. 2. The perfusion study demonstrated normal isotope uptake both at rest and stress. There was no evidence of ischemia or scar. Dynamic gated images reveal normal wall motion and endocardial thickening. Left ventricular ejection fraction was estimated to be 63%.  PV Angiogram [07/31/2014]: Peripheral arteriogram is 07/31/2014: right SFA Atherectomy with Crosser catheter, PTA with angiosculpt balloon followed by drug-coated balloon angioplasty with a 6.0 x 1 50 mm Lutonix Balloon. 100% to 0%. Three-vessel runoff to the right knee.  Lower Extremity Dopplers [06/05/2016]: occluded right SFA, no flow in the tibioperoneal trunk on the right, unable to pain ABI due to calcification, monophasic waveforms in the PT and DP.  Carotid duplex 03/01/2018: Right Carotid: Velocities in the right ICA are consistent with a 1-39% stenosis. Left Carotid: Velocities in the left ICA are consistent with a 1-39% stenosis. Vertebrals: Bilateral vertebral arteries demonstrate antegrade flow. Subclavians: Normal flow hemodynamics were seen in bilateral subclavian arteries.  Abdominal aortogram with lower extremity/peripheral vascular balloon angioplasty 11/14/2019: Findings:                Aortogram: No  significant renal artery stenosis was identified.  The infrarenal abdominal aorta body pain.  Bilateral common and external iliac arteries widely patent.               Right Lower Extremity: The right common femoral artery is calcified but patent.  There is diffuse disease at the origin of the profunda femoral artery as well as its proximal branches.  The superficial femoral artery is occluded at its origin with reconstitution at the level of the patella.  The popliteal artery is patent throughout its course.  There is three-vessel runoff to the ankle.  There is diffuse atherosclerotic vascular changes out  on the foot.                Left Lower Extremity: Not evaluated (amputation)  Intervention: After the above images were acquired the decision made to proceed with intervention.  A 7 French 45 cm sheath was placed in the right external iliac artery.  Patient was fully heparinized.  Using a 035 Glidewire and a quick cross catheter, the superficial femoral artery was selected.  Subintimal recanalization was performed.  I was able to get the catheter down to the popliteal space however I could not reenter.  After multiple failed attempts, I elected to abort and plan on surgical revascularization. 7 French sheath was exchanged out for 7 short sheath.  A minx device was used for closure however the balloon burst and so manual pressure was held.  Impression: Occluded right superficial femoral artery unsuccessful recanalization Patient will be scheduled for a right femoral below-knee popliteal artery bypass graft tomorrow for limb salvage.  Vascular ultrasound ABI 03/04/2020: +---------+------------------+-----+----------+--------+  Right  Rt Pressure (mmHg)IndexWaveform Comment   +---------+------------------+-----+----------+--------+  Brachial 148                      +---------+------------------+-----+----------+--------+  PTA   252        1.70 biphasic       +---------+------------------+-----+----------+--------+  DP    >254       1.72 monophasic      +---------+------------------+-----+----------+--------+  Great Toe                 Bandage   +---------+------------------+-----+----------+--------+   +--------+------------------+-----+--------+-------+  Left  Lt Pressure (mmHg)IndexWaveformComment  +--------+------------------+-----+--------+-------+  [id number]                     +--------+------------------+-----+--------+-------+ Summary:  Right: Resting  right ankle-brachial index indicates noncompressible right lower extremity arteries. Waveforms suggest adequate perfusion.   Vascular ultrasound lower extremity arterial duplex right 03/04/2020:  Patent femoral to popliteal bypass graft with no evidence for restenosis.    EKG:   EKG 06/18/2020: sinus rhythm at a rate of 66 bpm. Normal axis. Poor R wave progression, cannot exclude anteroseptal infarct old. No evidence of ischemia. Compared to EKG 05/22/2019, PRWP new.   Assessment     ICD-10-CM   1. PAD (peripheral artery disease) (HCC)  I73.9 EKG 12-Lead  2. Coronary artery disease involving native coronary artery of native heart without angina pectoris  I25.10 PCV CAROTID DUPLEX (BILATERAL)  3. Tobacco use  Z72.0   4. Left below-knee amputee ([place])  Z89.512   5. Primary hypertension  I10 amLODipine (NORVASC) 10 MG tablet  6. Carotid stenosis, asymptomatic, bilateral  I65.23 PCV CAROTID DUPLEX (BILATERAL)     Medications Discontinued During This Encounter  Medication Reason  . oxyCODONE (OXY IR/ROXICODONE) 5 MG immediate release tablet No longer needed (for PRN  medications)  . sulfamethoxazole-trimethoprim (BACTRIM DS) 800-160 MG tablet Completed Course  . amLODipine (NORVASC) 5 MG tablet Reorder    Meds ordered this encounter  Medications  . amLODipine (NORVASC) 10 MG tablet    Sig: Take 1 tablet (10 mg total) by mouth daily.    Dispense:  90 tablet    Refill:  3    Requesting 1 year supply    Recommendations:   [name] is a 69 y.o. caucasian male with history of coronary artery disease and had non-ST elevation myocardial infarction,  moderate diffuse scattered coronary artery disease. Last stress test in 2015 revealed no ischemia and normal LV systolic function.  Patient presents for annual follow up and is doing well following right leg bypass. He remains mildly symptomatic with claudication, however no resting pain or critical limb ischemia. I was unable to evaluate  chronic ulcer of the right great toe, but capillary refill normal and patient following closely with wound care. He is stable from a cardiac standpoint, without angina pectoris and EKG without ischemia.   I personally reviewed external labs, lipids are well controlled. Lipids well controlled, will continue Crestor $RemoveBeforeDE'10mg'jjcsneUfZRwTjAP$  daily. Diabetes is not well controlled with most recent available A1C 7.8%. Patient's blood pressure uncontrolled in the office today. Will increase amlodipine from $RemoveBeforeD'5mg'edwANDgojQlfiP$  to $R'10mg'GP$  daily. Will also continue ramipril, metoprolol succinate. Encourage patient to monitor blood pressure at home. Will continue to monitor bilateral carotid artery stenosis. Discussed with patient tobacco cessation.   Follow up in 6 months for hypertension, CAD, carotid artery stenosis.    Alethia Berthold, PA-C 06/20/2020, 7:35 PM Office: [phone]

## 2020-06-21 ENCOUNTER — Other Ambulatory Visit: Payer: Self-pay | Admitting: Cardiology

## 2020-07-01 ENCOUNTER — Encounter (HOSPITAL_BASED_OUTPATIENT_CLINIC_OR_DEPARTMENT_OTHER): Payer: Medicare Other | Attending: Internal Medicine | Admitting: Internal Medicine

## 2020-07-01 ENCOUNTER — Other Ambulatory Visit: Payer: Self-pay

## 2020-07-01 DIAGNOSIS — E10621 Type 1 diabetes mellitus with foot ulcer: Secondary | ICD-10-CM | POA: Diagnosis not present

## 2020-07-01 DIAGNOSIS — E1069 Type 1 diabetes mellitus with other specified complication: Secondary | ICD-10-CM | POA: Diagnosis not present

## 2020-07-01 DIAGNOSIS — E1051 Type 1 diabetes mellitus with diabetic peripheral angiopathy without gangrene: Secondary | ICD-10-CM | POA: Diagnosis not present

## 2020-07-01 DIAGNOSIS — E1042 Type 1 diabetes mellitus with diabetic polyneuropathy: Secondary | ICD-10-CM | POA: Diagnosis not present

## 2020-07-01 DIAGNOSIS — M86671 Other chronic osteomyelitis, right ankle and foot: Secondary | ICD-10-CM | POA: Insufficient documentation

## 2020-07-01 DIAGNOSIS — L97512 Non-pressure chronic ulcer of other part of right foot with fat layer exposed: Secondary | ICD-10-CM | POA: Diagnosis not present

## 2020-07-01 DIAGNOSIS — Z89512 Acquired absence of left leg below knee: Secondary | ICD-10-CM | POA: Diagnosis not present

## 2020-07-02 NOTE — Progress Notes (Signed)
[name], [name] ([id number]) Visit Report for 07/01/2020 Arrival Information Details Patient Name: Date of Service: [name], [name] 07/01/2020 8:00 A M Medical Record Number: [id number] Patient Account Number: [id number] Date of Birth/Sex: Treating RN: Nov 23, 1950 (69 y.o. [name] Primary Care Jalin Erpelding: [name] Other Clinician: Referring Caylee Vlachos: Treating Neave Lenger/Extender: Lacretia Leigh in Treatment: 22 Visit Information History Since Last Visit Added or deleted any medications: No Patient Arrived: Ambulatory Any new allergies or adverse reactions: No Arrival Time: 08:14 Had a fall or experienced change in No Accompanied By: wife activities of daily living that may affect Transfer Assistance: None risk of falls: Patient Identification Verified: Yes Signs or symptoms of abuse/neglect since last visito No Secondary Verification Process Completed: Yes Hospitalized since last visit: No Patient Requires Transmission-Based Precautions: No Implantable device outside of the clinic excluding No Patient Has Alerts: Yes cellular tissue based products placed in the center Patient Alerts: R ABI non compressible since last visit: Has Dressing in Place as Prescribed: Yes Pain Present Now: No Electronic Signature(s) Signed: 07/01/2020 4:07:04 PM By: Karl Ito Entered By: Karl Ito on 07/01/2020 08:15:13 -------------------------------------------------------------------------------- Clinic Level of Care Assessment Details Patient Name: Date of Service: [name], [name] 07/01/2020 8:00 A M Medical Record Number: [id number] Patient Account Number: [id number] Date of Birth/Sex: Treating RN: 06/20/1951 (69 y.o. [name] Primary Care Lorelei Heikkila: [name] Other Clinician: Referring Lianne Carreto: Treating Madysen Faircloth/Extender: Lacretia Leigh in Treatment: 22 Clinic Level of Care Assessment Items TOOL 4 Quantity Score X-  1 0 Use when only an EandM is performed on FOLLOW-UP visit ASSESSMENTS - Nursing Assessment / Reassessment X- 1 10 Reassessment of Co-morbidities (includes updates in patient status) X- 1 5 Reassessment of Adherence to Treatment Plan ASSESSMENTS - Wound and Skin A ssessment / Reassessment X - Simple Wound Assessment / Reassessment - one wound 1 5 []  - 0 Complex Wound Assessment / Reassessment - multiple wounds []  - 0 Dermatologic / Skin Assessment (not related to wound area) ASSESSMENTS - Focused Assessment []  - 0 Circumferential Edema Measurements - multi extremities []  - 0 Nutritional Assessment / Counseling / Intervention X- 1 5 Lower Extremity Assessment (monofilament, tuning fork, pulses) []  - 0 Peripheral Arterial Disease Assessment (using hand held doppler) ASSESSMENTS - Ostomy and/or Continence Assessment and Care []  - 0 Incontinence Assessment and Management []  - 0 Ostomy Care Assessment and Management (repouching, etc.) PROCESS - Coordination of Care X - Simple Patient / Family Education for ongoing care 1 15 []  - 0 Complex (extensive) Patient / Family Education for ongoing care X- 1 10 Staff obtains Consents, Records, T Results / Process Orders est []  - 0 Staff telephones HHA, Nursing Homes / Clarify orders / etc []  - 0 Routine Transfer to another Facility (non-emergent condition) []  - 0 Routine Hospital Admission (non-emergent condition) []  - 0 New Admissions / / Ordering NPWT Apligraf, etc. , []  - 0 Emergency Hospital Admission (emergent condition) X- 1 10 Simple Discharge Coordination []  - 0 Complex (extensive) Discharge Coordination PROCESS - Special Needs []  - 0 Pediatric / Minor Patient Management []  - 0 Isolation Patient Management []  - 0 Hearing / Language / Visual special needs []  - 0 Assessment of Community assistance (transportation, D/C planning, etc.) []  - 0 Additional assistance / Altered mentation []  -  0 Support Surface(s) Assessment (bed, cushion, seat, etc.) INTERVENTIONS - Wound Cleansing / Measurement X - Simple Wound Cleansing - one wound 1 5 []  - 0 Complex Wound  Cleansing - multiple wounds X- 1 5 Wound Imaging (photographs - any number of wounds) []  - 0 Wound Tracing (instead of photographs) X- 1 5 Simple Wound Measurement - one wound []  - 0 Complex Wound Measurement - multiple wounds INTERVENTIONS - Wound Dressings []  - 0 Small Wound Dressing one or multiple wounds []  - 0 Medium Wound Dressing one or multiple wounds []  - 0 Large Wound Dressing one or multiple wounds []  - 0 Application of Medications - topical []  - 0 Application of Medications - injection INTERVENTIONS - Miscellaneous []  - 0 External ear exam []  - 0 Specimen Collection (cultures, biopsies, blood, body fluids, etc.) []  - 0 Specimen(s) / Culture(s) sent or taken to Lab for analysis []  - 0 Patient Transfer (multiple staff / / Similar devices) []  - 0 Simple Staple / Suture removal (25 or less) []  - 0 Complex Staple / Suture removal (26 or more) []  - 0 Hypo / Hyperglycemic Management (close monitor of Blood Glucose) []  - 0 Ankle / Brachial Index (ABI) - do not check if billed separately X- 1 5 Vital Signs Has the patient been seen at the hospital within the last three years: Yes Total Score: 80 Level Of Care: New/Established - Level 3 Electronic Signature(s) Signed: 07/01/2020 6:16:40 PM By: RN, BSN Entered By: on 07/01/2020 08:50:30 -------------------------------------------------------------------------------- Encounter Discharge Information Details Patient Name: Date of Service: . 07/01/2020 8:00 A M Medical Record Number: Patient Account Number: Date of Birth/Sex: Treating RN: 07-19-1951 (69 y.o. Nurse, adult Primary Care Laurier Jasperson: Other Clinician: Referring Eneida Evers: Treating  Tarron Krolak/Extender: in Treatment: 22 Encounter Discharge Information Items Discharge Condition: Stable Ambulatory Status: Ambulatory Discharge Destination: Home Transportation: Private Auto Accompanied By: wife Schedule Follow-up Appointment: Yes Clinical Summary of Care: Patient Declined Electronic Signature(s) Signed: 07/01/2020 6:16:40 PM By: RN, BSN Entered By: 13/03/2020 on 07/01/2020 08:51:02 -------------------------------------------------------------------------------- Lower Extremity Assessment Details Patient Name: Date of Service: [name] Abts. 07/01/2020 8:00 A M Medical Record Number: [name] Patient Account Number: 13/03/2020 Date of Birth/Sex: Treating RN: 03-Dec-1950 (69 y.o. 07/19/1951 Primary Care Cole Klugh: 80 Other Clinician: Referring Najae Filsaime: Treating Temitope Griffing/Extender: [name] in Treatment: 22 Edema Assessment Assessed: [name]: No] Lacretia Leigh: No] Edema: [Left: N] [Right: o] Calf Left: Right: Point of Measurement: From Medial Instep 33 cm Ankle Left: Right: Point of Measurement: From Medial Instep 21 cm Vascular Assessment Pulses: Dorsalis Pedis Palpable: [Right:Yes] Electronic Signature(s) Signed: 07/01/2020 6:16:40 PM By: [name] Abts RN, BSN Entered By: [name] Abts on 07/01/2020 08:27:08 -------------------------------------------------------------------------------- Multi Wound Chart Details Patient Name: Date of Service: [name]. 07/01/2020 8:00 A M Medical Record Number: [id number] Patient Account Number: [id number] Date of Birth/Sex: Treating RN: 14-Sep-1950 (69 y.o. [name] Primary Care Kmya Placide: [name] Other Clinician: Referring Jessicaann Overbaugh: Treating Keanu Lesniak/Extender: Lacretia Leigh in Treatment: 22 Vital Signs Height(in): 71 Pulse(bpm): 70 Weight(lbs): 152 Blood Pressure(mmHg): 151/74 Body  Mass Index(BMI): 21 Temperature(F): 97.9 Respiratory Rate(breaths/min): 18 Photos: [1:No Photos Right T Great oe] [N/A:N/A N/A] Wound Location: [1:Gradually Appeared] [N/A:N/A] Wounding Event: [1:Diabetic Wound/Ulcer of the Lower] [N/A:N/A] Primary Etiology: [1:Extremity Cataracts, Coronary Artery Disease,] [N/A:N/A] Comorbid History: [1:Peripheral Arterial Disease, Type I Diabetes, Osteomyelitis, Neuropathy 11/23/2019] [N/A:N/A] Date Acquired: [1:22] [N/A:N/A] Weeks of Treatment: [1:Healed - Epithelialized] [N/A:N/A] Wound Status: [1:0x0x0] [N/A:N/A] Measurements L x W x D (cm) [1:0] [N/A:N/A] A (cm) : rea [1:0] [N/A:N/A] Volume (cm) : [  1:100.00%] [N/A:N/A] % Reduction in A rea: [1:100.00%] [N/A:N/A] % Reduction in Volume: [1:Grade 2] [N/A:N/A] Classification: [1:None Present] [N/A:N/A] Exudate A mount: [1:Flat and Intact] [N/A:N/A] Wound Margin: [1:None Present (0%)] [N/A:N/A] Granulation A mount: [1:None Present (0%)] [N/A:N/A] Necrotic A mount: [1:Fascia: No] [N/A:N/A] Exposed Structures: [1:Fat Layer (Subcutaneous Tissue): No Tendon: No Muscle: No Joint: No Bone: No Large (67-100%)] [N/A:N/A] Treatment Notes Electronic Signature(s) Signed: 07/01/2020 6:16:40 PM By: [name] AbtsLynch, Shatara RN, BSN Signed: 07/02/2020 3:11:24 PM By: [name], Michael MD Entered By: [name], Michael on 07/01/2020 08:42:29 -------------------------------------------------------------------------------- Multi-Disciplinary Care Plan Details Patient Name: Date of Service: [name], [name] 07/01/2020 8:00 A M Medical Record Number: [id number] Patient Account Number: [id number] Date of Birth/Sex: Treating RN: 1951-05-26 (69 y.o. [name]) Lynch, Shatara Primary Care Magon Croson: [name], [name] Other Clinician: Referring Susana Gripp: Treating Zykiria Bruening/Extender: Lacretia Leighobson, Michael Russo, [name] in Treatment: 22 Active Inactive Electronic Signature(s) Signed: 07/01/2020 6:16:40 PM By: [name] AbtsLynch, Shatara RN, BSN Entered By:  [name] AbtsLynch, Shatara on 07/01/2020 08:49:42 -------------------------------------------------------------------------------- Pain Assessment Details Patient Name: Date of Service: [name], [name] 07/01/2020 8:00 A M Medical Record Number: [id number] Patient Account Number: [id number] Date of Birth/Sex: Treating RN: 1951-05-26 (69 y.o. [name]) Lynch, Shatara Primary Care Kingjames Coury: [name], [name] Other Clinician: Referring Shivonne Schwartzman: Treating Seraphina Mitchner/Extender: Lacretia Leighobson, Michael Russo, [name] in Treatment: 22 Active Problems Location of Pain Severity and Description of Pain Patient Has Paino No Site Locations Pain Management and Medication Current Pain Management: Electronic Signature(s) Signed: 07/01/2020 4:07:04 PM By: Karl Itoawkins, Destiny Signed: 07/01/2020 6:16:40 PM By: [name] AbtsLynch, Shatara RN, BSN Entered By: Karl Itoawkins, Destiny on 07/01/2020 08:15:39 -------------------------------------------------------------------------------- Patient/Caregiver Education Details Patient Name: Date of Service: [name], [name] 11/8/2021andnbsp8:00 A M Medical Record Number: [id number] Patient Account Number: [id number] Date of Birth/Gender: Treating RN: 1951-05-26 (69 y.o. [name]) Lynch, Shatara Primary Care Physician: [name], [name] Other Clinician: Referring Physician: Treating Physician/Extender: Lacretia Leighobson, Michael Russo, [name] in Treatment: 22 Education Assessment Education Provided To: Patient Education Topics Provided Wound/Skin Impairment: Methods: Explain/Verbal Responses: State content correctly Nash-F[employer] CompanyElectronic Signature(s) Signed: 07/01/2020 6:16:40 PM By: [name] AbtsLynch, Shatara RN, BSN Entered By: [name] AbtsLynch, Shatara on 07/01/2020 08:49:53 -------------------------------------------------------------------------------- Wound Assessment Details Patient Name: Date of Service: [name], [name] 07/01/2020 8:00 A M Medical Record Number: [id number] Patient Account Number: [id number] Date of Birth/Sex: Treating  RN: 1951-05-26 (69 y.o. [name]) Lynch, Shatara Primary Care Fionnuala Hemmerich: [name], [name] Other Clinician: Referring Nilton Lave: Treating Rily Nickey/Extender: Lacretia Leighobson, Michael Russo, [name] in Treatment: 22 Wound Status Wound Number: 1 Primary Diabetic Wound/Ulcer of the Lower Extremity Etiology: Wound Location: Right T Great oe Wound Healed - Epithelialized Wounding Event: Gradually Appeared Status: Date Acquired: 11/23/2019 Comorbid Cataracts, Coronary Artery Disease, Peripheral Arterial Disease, Weeks Of Treatment: 22 History: Type I Diabetes, Osteomyelitis, Neuropathy Clustered Wound: No Photos Photo Uploaded By: Benjaman KindlerJones, Dedrick on 07/02/2020 08:58:04 Wound Measurements Length: (cm) Width: (cm) Depth: (cm) Area: (cm) Volume: (cm) 0 % Reduction in Area: 100% 0 % Reduction in Volume: 100% 0 Epithelialization: Large (67-100%) 0 Tunneling: No 0 Undermining: No Wound Description Classification: Grade 2 Wound Margin: Flat and Intact Exudate Amount: None Present Foul Odor After Cleansing: No Slough/Fibrino No Wound Bed Granulation Amount: None Present (0%) Exposed Structure Necrotic Amount: None Present (0%) Fascia Exposed: No Fat Layer (Subcutaneous Tissue) Exposed: No Tendon Exposed: No Muscle Exposed: No Joint Exposed: No Bone Exposed: No Electronic Signature(s) Signed: 07/01/2020 6:16:40 PM By: [name] AbtsLynch, Shatara RN, BSN Entered By: [name] AbtsLynch, Shatara on 07/01/2020 08:37:30 -------------------------------------------------------------------------------- Vitals Details Patient Name: Date of Service: [name], [name] 07/01/2020 8:00 A M Medical Record Number:  [id number] Patient Account Number: [id number] Date of Birth/Sex: Treating RN: 16-Jun-1951 (69 y.o. [name] Primary Care Shatia Sindoni: [name] Other Clinician: Referring Aleathia Purdy: Treating Adell Panek/Extender: Lacretia Leigh in Treatment: 22 Vital Signs Time Taken: 08:15 Temperature (F):  97.9 Height (in): 71 Pulse (bpm): 70 Weight (lbs): 152 Respiratory Rate (breaths/min): 18 Body Mass Index (BMI): 21.2 Blood Pressure (mmHg): 151/74 Reference Range: 80 - 120 mg / dl Electronic Signature(s) Signed: 07/01/2020 4:07:04 PM By: Karl Ito Entered By: Karl Ito on 07/01/2020 08:15:35

## 2020-07-02 NOTE — Progress Notes (Signed)
BANNON, GIAMMARCO ([id number]) Visit Report for 07/01/2020 HPI Details Patient Name: Date of Service: [name], [name] 07/01/2020 8:00 A M Medical Record Number: [id number] Patient Account Number: [id number] Date of Birth/Sex: Treating RN: Feb 13, 1951 (69 y.o. 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 He is a type I diabetic on an insulin pump diagnosed at age 45. He also has known peripheral neuropathy. He has been dealing with a wound on his right first toe since at least February by review of epic. He was seen by Dr. Lajoyce Corners. Diagnosed with an ischemic wound sent to see Dr. Myra Gianotti him who noted a noncompressible ABI 1.67 and a TBI of 0.25 monophasic waveforms. On 11/15/2019 he underwent a right femoral to below-knee popliteal bypass with an ipsilateral saphenous vein. He also underwent extensive endarterectomy I believe that the external iliac. Postoperative ABI was 1.16 he has been using Neosporin on this and gradually making progress according to his companion. He is offloading this with a hole in the inserts in his work shoes. Per his companion who does the dressings the wound has been getting smaller Past medical history; type 1 diabetes with PAD and PN, history of a left BKA in 2017, coronary artery disease, hypercholesterolemia, hypertension he is a smoker 6/29; this is a patient I have not seen in 4 weeks. He is a type I diabetic on an insulin pump with a prior left BKA. He was revascularized before he came to our clinic with a right femoral to below-knee popliteal bypass with a saphenous vein. He also had an extensive endarterectomy. We used Hydrofera Blue to this wound. 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  Wears work boots. 7/13; type I diabetic with an area on the plantar tip of his right great toe in the setting of a previous left BKA and extensive endarterectomy with a right femoral to below-knee popliteal bypass before he came to our clinic. We have been using Hydrofera Blue the wound is measuring small. 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 Previous left BKA. He is also had an extensive endarterectomy with a right femoral to below-knee popliteal bypass before he came here. We have been using Hydrofera Blue. Nice improvements in the wound which is small and superficial now. [facility] I think he made this on his own but we have talked about this last week. He has a hammer deformity of the toe. I also wondered if they could be helpful in modifying his foot wear. 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 For the next 3 weeks he is traveling with his wife and therefore will not be outdoors as much on his farm. 8/17; patient returns to clinic today with a much bigger  wound than what I was used to seeing 3 weeks ago. I have looked through his arterial studies from 7/12. At that point he had noncompressible ABIs at 1.72 biphasic waveforms at the PTA and monophasic waveforms at the dorsalis pedis. Unfortunately they did not do a TBI on the great toe because it was bandaged. The interpretation however suggested waveforms showed adequate perfusion. The patient states that he saw Dr. Myra Gianotti about a month ago, I will see if  I can pull this record. As noted previously the patient is a very active man works with a left below-knee prosthesis. The great toe has a bit of a hammer deformity to it. He is trying to offload this using modified insoles in his shoes i.e. cutting out a part of this to lift the toe off the bottom of his shoe. 8/23; culture grew abundant amount of Enterobacter and a few methicillin sensitive staph aureus. I empirically put him on Levaquin in response to this although we were not able to get a hold of him by phone to let them know that the antibiotic was at his pharmacy therefore he has not started it yet. I encouraged him to start today. The wound is larger, a lot of drainage. There is nothing that probes to bone however we are certainly not making any progress. He is attempting to modify his foot wear. 8/30; X-ray ORDERED last WEEK showed suggestion of osteomyelitis of the first tuft of the right great toe. I looked at this x-ray indeed it does look damaged. He reminds me that he has rods and screws in his left arm from remote trauma. He will not be a candidate for an MRI. Nevertheless I think it is important to be sure of the diagnosis here. The patient also has severe PAD. Amputation of parts of the total toe may be necessary if this does not heal. He started Levaquin last week. Using silver alginate on the wound 9/13; we still do not have a CT scan. He is completing 2 weeks of Levaquin and 2 weeks of Flagyl. Most of his complaints of side effects I think are related to the Flagyl but that can stop as of when he finishes his medications either later today or tomorrow. He tells me that he has been using Neosporin on this for the last week, thinking that the silver alginate causes drainage. I do not know told him I thought this would be unlikely. It looks like he is offloading this better 9/28. Very small improvement in measurements. Surface looks reasonably healthy. He has finished 4 weeks of  Levaquin and 2 weeks of Flagyl. He has a CT scan on October 5. Change the dressing to [facility] today 10/4; dimensions about the same however the wound looks very healthy. We are using Hydrofera Blue. Felt offloading. 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 He has 1 more week of Levaquin CT scan tomorrow 10/11; wound is contracting surface looks very healthy we have been using Hydrofera Blue His CT scan showed osteomyelitis of the distal tuft of the great toe this is what I thought it would show fortunately does not show any damage beyond what I thought. Also noted that she he had advanced neuropathic change at the Lisfranc joint with osteophytes and heterotrophic calcification. There was no evidence of soft tissue emphysema. He is completing his Levaquin I gave him 2 weeks of Flagyl as well I have told him I think we should wait and see now. If we can offload  this enough for him to heal. we will watch 10/25; dimensions of the wound are down dramatically. Epithelialization however the epithelialization still looks somewhat vulnerable. His wife says he has been up on the tractor recently which she thinks has offloaded the wound. They are also using a felt donut 11/8; the wound is fully epithelialized. He completed 6 weeks of Levaquin and 2 weeks of Flagyl. Electronic Signature(s) Signed: 07/02/2020 3:11:24 PM By: Baltazar Najjar MD Signed: 07/02/2020 3:11:24 PM By: Baltazar Najjar MD Entered By: Baltazar Najjar on 07/01/2020 08:43:35 -------------------------------------------------------------------------------- Physical Exam Details Patient Name: Date of Service: [name] 07/01/2020 8:00 A M Medical Record Number: [id number] Patient Account Number: [id number] Date of Birth/Sex: Treating RN: April 02, 1951 (69 y.o. [name] Primary Care Provider: [name] Other Clinician: Referring Provider: Treating Provider/Extender: Lacretia Leigh in Treatment:  22 Constitutional Patient is hypertensive.. Pulse regular and within target range for patient.Marland Kitchen Respirations regular, non-labored and within target range.. Temperature is normal and within the target range for the patient.Marland Kitchen Appears in no distress. Notes Wound exam; right plantar great toe distal. The wound is fully epithelialized. Surface looks healthy. He has some loss of subcutaneous tissue and there is not much tissue between skin and bone here however everything is closed no erythema everything looks healthy. Electronic Signature(s) Signed: 07/02/2020 3:11:24 PM By: Baltazar Najjar MD Entered By: Baltazar Najjar on 07/01/2020 08:44:17 -------------------------------------------------------------------------------- Physician Orders Details Patient Name: Date of Service: [name]. 07/01/2020 8:00 A M Medical Record Number: [id number] Patient Account Number: [id number] Date of Birth/Sex: Treating RN: [birth year] (69 y.o. 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 07/01/2020 8:00 A M Medical Record Number: [id number] Patient Account Number: [id number] Date of Birth/Sex: Treating RN: 1951-05-11 (69 y.o. [name] Primary Care Provider: [name] Other Clinician: Referring Provider: Treating Provider/Extender: Lacretia Leigh in Treatment: 22 Active Problems ICD-10 Encounter Code Description Active Date MDM Diagnosis E10.621 Type 1 diabetes mellitus with foot ulcer 01/23/2020 No Yes L97.512 Non-pressure chronic ulcer of other part of right foot with fat layer exposed 01/23/2020 No Yes E10.51 Type 1 diabetes mellitus with diabetic peripheral angiopathy without gangrene 01/23/2020 No Yes E10.42 Type 1 diabetes mellitus with diabetic polyneuropathy 01/23/2020 No Yes M86.671 Other chronic osteomyelitis, right ankle and foot 04/22/2020 No Yes Inactive Problems Resolved Problems Electronic Signature(s) Signed: 07/02/2020 3:11:24 PM By: Baltazar Najjar MD Entered By: Baltazar Najjar on 07/01/2020 08:42:23 -------------------------------------------------------------------------------- Progress Note Details Patient Name: Date of Service: [name]. 07/01/2020 8:00 A M Medical Record Number: [id number] Patient Account Number: [id number] Date of Birth/Sex: Treating RN: September 21, 1950 (69 y.o. 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 He is a type I diabetic on an insulin pump diagnosed at age 89. He also has known peripheral neuropathy. He has been dealing with a wound on his right first toe  since at least February by review of epic. He was seen by Dr. Lajoyce Cornersuda. Diagnosed with an ischemic wound sent to see Dr. Myra GianottiBrabham him who noted a noncompressible ABI 1.67 and a TBI of 0.25 monophasic waveforms. On 11/15/2019 he underwent a right femoral to below-knee popliteal bypass with an ipsilateral saphenous vein. He also underwent extensive endarterectomy I believe that the external iliac. Postoperative ABI was 1.16 he has been using Neosporin on this and gradually making progress according to his companion. He is offloading this with a hole in the inserts in his work shoes. Per his companion who does the dressings the wound has been getting smaller Past medical history; type 1 diabetes with PAD and PN, history of a left BKA in 2017, coronary artery disease, hypercholesterolemia, hypertension he is a smoker 6/29; this is a patient I have not seen in 4 weeks. He is a type I diabetic on an insulin pump with a prior left BKA. He was revascularized before he came to our clinic with a right femoral to below-knee popliteal bypass with a saphenous vein. He also had an extensive endarterectomy. We used Hydrofera Blue to this wound. 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 Wears work boots. 7/13; type I diabetic with an area on the plantar tip of his right great toe in the setting of a previous left BKA and extensive endarterectomy with a right femoral to below-knee popliteal bypass before he came to our clinic. We have been using Hydrofera Blue the wound is measuring small. 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 Previous left BKA. He is also had an extensive endarterectomy with a right femoral to below-knee popliteal bypass before he came here. We have been using Hydrofera Blue. Nice improvements in the wound which is small and superficial now. He has an appointment  tom[facility] I think he made this on his own but we have talked about this last week. He has a hammer deformity of the toe. I also wondered if they could be helpful in modifying his foot wear. 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 For the next 3 weeks he is traveling with his wife and therefore will not be outdoors as much on his farm. 8/17; patient returns to clinic today with a much bigger wound than what I was used to seeing 3 weeks ago. I have looked through his arterial studies from 7/12. At that point he had noncompressible ABIs at 1.72 biphasic waveforms at the PTA and monophasic waveforms at the dorsalis pedis. Unfortunately they did not do a TBI on the great  toe because it was bandaged. The interpretation however suggested waveforms showed adequate perfusion. The patient states that he saw Dr. Myra Gianotti about a month ago, I will see if I can pull this record. As noted previously the patient is a very active man works with a left below-knee prosthesis. The great toe has a bit of a hammer deformity to it. He is trying to offload this using modified insoles in his shoes i.e. cutting out a part of this to lift the toe off the bottom of his shoe. 8/23; culture grew abundant amount of Enterobacter and a few methicillin sensitive staph aureus. I empirically put him on Levaquin in response to this although we were not able to get a hold of him by phone to let them know that the antibiotic was at his pharmacy therefore he has not started it yet. I encouraged him to start today. The wound is larger, a lot of drainage. There is nothing that probes to bone however we are certainly  not making any progress. He is attempting to modify his foot wear. 8/30; X-ray ORDERED last WEEK showed suggestion of osteomyelitis of the first tuft of the right great toe. I looked at this x-ray indeed it does look damaged. He reminds me that he has rods and screws in his left arm from remote trauma. He will not be a candidate for an MRI. Nevertheless I think it is important to be sure of the diagnosis here. The patient also has severe PAD. Amputation of parts of the total toe may be necessary if this does not heal. He started Levaquin last week. Using silver alginate on the wound 9/13; we still do not have a CT scan. He is completing 2 weeks of Levaquin and 2 weeks of Flagyl. Most of his complaints of side effects I think are related to the Flagyl but that can stop as of when he finishes his medications either later today or tomorrow. He tells me that he has been using Neosporin on this for the last week, thinking that the silver alginate causes drainage. I do not know told him I thought this would be unlikely. It looks like he is offloading this better 9/28. Very small improvement in measurements. Surface looks reasonably healthy. He has finished 4 weeks of Levaquin and 2 weeks of Flagyl. He has a CT scan on October 5. Change the dressing to [facility] today 10/4; dimensions about the same however the wound looks very healthy. We are using Hydrofera Blue. Felt offloading. 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 He has 1 more week of Levaquin CT scan tomorrow 10/11; wound is contracting surface looks very healthy we have been using Hydrofera Blue His CT scan showed osteomyelitis of the distal tuft of the great toe this is what I thought it would show fortunately does not show any damage beyond what I thought. Also noted that she he had advanced neuropathic change at the Lisfranc joint with osteophytes and heterotrophic calcification. There was no evidence of soft tissue emphysema. He is  completing his Levaquin I gave him 2 weeks of Flagyl as well I have told him I think we should wait and see now. If we can offload this enough for him to heal. we will watch 10/25; dimensions of the wound are down dramatically. Epithelialization however the epithelialization still looks somewhat vulnerable. His wife says he has been up on the tractor recently which she thinks has offloaded the wound. They are also using a felt donut 11/8;  the wound is fully epithelialized. He completed 6 weeks of Levaquin and 2 weeks of Flagyl. Objective Constitutional Patient is hypertensive.. Pulse regular and within target range for patient.Marland Kitchen Respirations regular, non-labored and within target range.. Temperature is normal and within the target range for the patient.Marland Kitchen Appears in no distress. Vitals Time Taken: 8:15 AM, Height: 71 in, Weight: 152 lbs, BMI: 21.2, Temperature: 97.9 F, Pulse: 70 bpm, Respiratory Rate: 18 breaths/min, Blood Pressure: 151/74 mmHg. General Notes: Wound exam; right plantar great toe distal. The wound is fully epithelialized. Surface looks healthy. He has some loss of subcutaneous tissue and there is not much tissue between skin and bone here however everything is closed no erythema everything looks healthy. Integumentary (Hair, Skin) Wound #1 status is Healed - Epithelialized. Original cause of wound was Gradually Appeared. The wound is located on the Right T Great. The wound oe measures 0cm length x 0cm width x 0cm depth; 0cm^2 area and 0cm^3 volume. There is no tunneling or undermining noted. There is a none present amount of drainage noted. The wound margin is flat and intact. There is no granulation within the wound bed. There is no necrotic tissue within the wound bed. Assessment Active Problems ICD-10 Type 1 diabetes mellitus with foot ulcer Non-pressure chronic ulcer of other part of right foot with fat layer exposed Type 1 diabetes mellitus with diabetic peripheral  angiopathy without gangrene Type 1 diabetes mellitus with diabetic polyneuropathy Other chronic osteomyelitis, right ankle and foot Plan Discharge From [facility] Services: Discharge from Wound Care Center Primary Wound Dressing: Other: - keep right great toe padded with foam for protection 1. The wound is closed 2. Underlying osteomyelitis was treated with oral antibiotics. 3. If if this turns around and reopens he would probably need surgical consultation plus or minus IV antibiotics 4. He can be discharged from the clinic. They understand to keep this offloaded is much as possible Electronic Signature(s) Signed: 07/02/2020 3:11:24 PM By: Baltazar Najjar MD Entered By: Baltazar Najjar on 07/01/2020 08:45:06 -------------------------------------------------------------------------------- SuperBill Details Patient Name: Date of Service: [name] 07/01/2020 Medical Record Number: [id number] Patient Account Number: [id number] Date of Birth/Sex: Treating RN: [birth year] (69 y.o. 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, right ankle and foot Facility Procedures CPT4 Code: [id number] Description: 99213 - WOUND CARE VISIT-LEV 3 EST PT Modifier: Quantity: 1 Physician Procedures : CPT4 Code Description Modifier [id number] - WC PHYS LEVEL 3 - EST PT ICD-10 Diagnosis Description E10.621 Type 1 diabetes mellitus with foot ulcer L97.512 Non-pressure chronic ulcer of other part of right foot with fat layer  exposed M86.671 Other  chronic osteomyelitis, right ankle and foot Quantity: 1 Electronic Signature(s) Signed: 07/01/2020 6:16:40 PM By: Zandra Abts RN, BSN Signed: 07/02/2020 3:11:24 PM By: Baltazar Najjar MD Entered By: Zandra Abts on 07/01/2020 08:50:40

## 2020-07-24 ENCOUNTER — Ambulatory Visit: Payer: Medicare Other

## 2020-07-24 ENCOUNTER — Other Ambulatory Visit: Payer: Self-pay

## 2020-07-24 DIAGNOSIS — I6523 Occlusion and stenosis of bilateral carotid arteries: Secondary | ICD-10-CM

## 2020-07-24 DIAGNOSIS — I2510 Atherosclerotic heart disease of native coronary artery without angina pectoris: Secondary | ICD-10-CM

## 2020-07-26 NOTE — Progress Notes (Signed)
Please inform patient there is no significant stenosis (blockage) in his carotid arteries. We will continue to manage cardiovascular risk factors as we are.

## 2020-07-26 NOTE — Progress Notes (Signed)
Called and spoke with patient regarding his CAD results.

## 2020-12-16 ENCOUNTER — Ambulatory Visit: Payer: Medicare Other | Admitting: Student

## 2021-04-24 ENCOUNTER — Other Ambulatory Visit: Payer: Self-pay | Admitting: Cardiology

## 2021-04-24 DIAGNOSIS — I10 Essential (primary) hypertension: Secondary | ICD-10-CM

## 2021-05-24 ENCOUNTER — Other Ambulatory Visit: Payer: Self-pay | Admitting: Student

## 2021-07-15 ENCOUNTER — Other Ambulatory Visit: Payer: Self-pay | Admitting: Cardiology

## 2021-07-15 DIAGNOSIS — I10 Essential (primary) hypertension: Secondary | ICD-10-CM

## 2021-08-05 ENCOUNTER — Ambulatory Visit (INDEPENDENT_AMBULATORY_CARE_PROVIDER_SITE_OTHER): Payer: Medicare Other | Admitting: Family

## 2021-08-05 ENCOUNTER — Other Ambulatory Visit: Payer: Self-pay

## 2021-08-05 DIAGNOSIS — S88112A Complete traumatic amputation at level between knee and ankle, left lower leg, initial encounter: Secondary | ICD-10-CM

## 2021-08-05 DIAGNOSIS — Z89512 Acquired absence of left leg below knee: Secondary | ICD-10-CM

## 2021-08-06 ENCOUNTER — Encounter: Payer: Self-pay | Admitting: Family

## 2021-08-06 NOTE — Progress Notes (Signed)
Office Visit Note   Patient: [name]           Date of Birth: 12/16/1950           MRN: [id number] Visit Date: 08/05/2021              Requested by: [name], MD [address] [place],  [place] [address] PCP: [name], MD  Chief Complaint  Patient presents with   Left Leg - Follow-up    03/02/16 revision left BKA      HPI: The patient is a 70 year old gentleman who presents to the clinic for follow-up.  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  His current prosthetic is breaking down and ill fitting.  He denies any wounds or impending ulceration however states that he is getting quite a bit of pain in the popliteal fossa.  He states his residual limb rotates in his socket with ambulation he is uncomfortable ambulating fears falling.  He has increased his layer of ply by 5.  Also concerned that his liners are broken down the pin is pulling away from his liner and he has several breaks in his outer liners.  Assessment & Plan: Visit Diagnoses: No diagnosis found.  Plan: Have provided an order for prosthesis supplies as well as new prosthesis set up for his left below-knee amputation to [facility].  He will follow-up in the office as needed  Follow-Up Instructions: No follow-ups on file.   Ortho Exam  Patient is alert, oriented, no adenopathy, well-dressed, normal affect, normal respiratory effort. On examination of the left residual limb there is no ulcer no erythema no edema no impending ulceration  Imaging: No results found. No images are attached to the encounter.  Labs: Lab Results  Component Value Date   HGBA1C 8.0 (H) 11/14/2019   HGBA1C 9.1 (H) 10/24/2015   REPTSTATUS 04/14/2020 FINAL 04/09/2020   GRAMSTAIN  04/09/2020    FEW WBC PRESENT, PREDOMINANTLY PMN MODERATE GRAM NEGATIVE RODS MODERATE GRAM POSITIVE COCCI IN CLUSTERS Performed at [facility], [address], [place], [place] [address]    CULT   04/09/2020    ABUNDANT ENTEROBACTER CLOACAE FEW STAPHYLOCOCCUS AUREUS    LABORGA ENTEROBACTER CLOACAE 04/09/2020   LABORGA STAPHYLOCOCCUS AUREUS 04/09/2020     Lab Results  Component Value Date   ALBUMIN 3.5 10/22/2015   ALBUMIN 3.9 09/01/2014    No results found for: MG No results found for: VD25OH  No results found for: PREALBUMIN CBC EXTENDED Latest Ref Rng & Units 11/16/2019 11/15/2019 11/14/2019  WBC 4.0 - 10.5 K/uL 8.1 8.2 -  RBC 4.22 - 5.81 MIL/uL 2.85(L) 3.49(L) -  HGB 13.0 - 17.0 g/dL 8.4(X) 11.5(L) 13.6  HCT 39.0 - 52.0 % 27.5(L) 33.5(L) 40.0  PLT 150 - 400 K/uL 262 350 -  NEUTROABS 1.7 - 7.7 K/uL - - -  LYMPHSABS 0.7 - 4.0 K/uL - - -     There is no height or weight on file to calculate BMI.  Orders:  No orders of the defined types were placed in this encounter.  No orders of the defined types were placed in this encounter.    Procedures: No procedures performed  Clinical Data: No additional findings.  ROS:  All other systems negative, except as noted in the HPI. Review of Systems  Objective: Vital Signs: There were no vitals taken for this visit.  Specialty Comments:  No specialty comments available.  PMFS History: Patient  Active Problem List   Diagnosis Date Noted   Below knee amputation status 12/06/2015   PAD (peripheral artery disease) (HCC) 10/24/2015   Diabetes mellitus with peripheral artery disease (HCC) 07/30/2014   Claudication in peripheral vascular disease (HCC) 07/30/2014   IDDM (insulin dependent diabetes mellitus) 01/23/2013   CAD (coronary artery disease) 04/15/2011   Hyperlipidemia 04/15/2011   Past Medical History:  Diagnosis Date   Cigarette smoker    Coronary artery disease    Diabetes mellitus    type 1  x 50 yrs   Diabetic retinopathy (HCC)    Dyslipidemia    Hypertension    Peripheral vascular disease (HCC)     Family History  Problem Relation Age of Onset   Coronary artery disease Mother     Past  Surgical History:  Procedure Laterality Date   ABDOMINAL ANGIOGRAM N/A 05/29/2014   Procedure: ABDOMINAL ANGIOGRAM;  Surgeon: Pamella Pert, MD;  Location: [facility] CATH LAB;  Service: Cardiovascular;  Laterality: N/A;   ABDOMINAL AORTOGRAM W/LOWER EXTREMITY N/A 11/14/2019   Procedure: ABDOMINAL AORTOGRAM W/LOWER EXTREMITY;  Surgeon: Nada Libman, MD;  Location: [facility] INVASIVE CV LAB;  Service: Cardiovascular;  Laterality: N/A;   AMPUTATION Left 10/25/2015   Procedure: Left Foot 5th Ray Amputation;  Surgeon: Nadara Mustard, MD;  Location: [facility] OR;  Service: Orthopedics;  Laterality: Left;   AMPUTATION Left 12/06/2015   Procedure: AMPUTATION BELOW KNEE;  Surgeon: Nadara Mustard, MD;  Location: [facility] OR;  Service: Orthopedics;  Laterality: Left;   CARDIAC CATHETERIZATION     EF is 55-60% and no wall motion abnormalities (long long time ago)   FEMORAL-POPLITEAL BYPASS GRAFT Left 10/24/2015   Procedure: BYPASS GRAFT FEMORAL below knee POPLITEAL ARTERY with Left Saphenous Vein;  Surgeon: Nada Libman, MD;  Location: [facility] OR;  Service: Vascular;  Laterality: Left;   FEMORAL-POPLITEAL BYPASS GRAFT Right 11/15/2019   Procedure: BYPASS GRAFT FEMORAL-POPLITEAL ARTERY using Right Leg Greater Saphenous Vein;  Surgeon: Nada Libman, MD;  Location: [facility] OR;  Service: Vascular;  Laterality: Right;   FRACTURE SURGERY     left arm "many yrs ago"   LOWER EXTREMITY ANGIOGRAM N/A 01/23/2014   Procedure: LOWER EXTREMITY ANGIOGRAM;  Surgeon: Pamella Pert, MD;  Location: [facility] CATH LAB;  Service: Cardiovascular;  Laterality: N/A;   LOWER EXTREMITY ANGIOGRAM N/A 07/31/2014   Procedure: LOWER EXTREMITY ANGIOGRAM;  Surgeon: Pamella Pert, MD;  Location: [facility] CATH LAB;  Service: Cardiovascular;  Laterality: N/A;   LOWER EXTREMITY ANGIOGRAM Left 10/11/2015   Procedure: Lower Extremity Angiogram;  Surgeon: Nada Libman, MD;  Location[facility] INVASIVE CV LAB;  Service: Cardiovascular;  Laterality: Left;   PERIPHERAL VASCULAR BALLOON  ANGIOPLASTY  11/14/2019   Procedure: PERIPHERAL VASCULAR BALLOON ANGIOPLASTY;  Surgeon: Nada Libman, MD;  Location: [facility] INVAS[facility] CV LAB;  Service: Cardiovascular;;   PERIPHERAL VASCULAR CATHETERIZATION Left 10/11/2015   Procedure: Peripheral Vascular Balloon Angioplasty;  Surgeon: Nada Libman, MD;  Location: [facility] INVASIVE CV LAB;  Service: Cardiovascular;  Laterality: Left;  sfa failed unable to cross occluded sfa   PERIPHERAL VASCULAR CATHETERIZATION N/A 10/11/2015   Procedure: Abdominal Aortogram;  Surgeon: Nada Libman, MD;  Location: [facility] INVASIVE CV LAB;  Service: Cardiovascular;  Laterality: N/A;   STUMP REVISION Left 03/02/2016   Procedure: Revision Left Below Knee Amputation;  Surgeon: Nadara Mustard, MD;  Location: [facility] OR;  Service: Orthopedics;  Laterality: Left;   Social History   Occupational History   Not on  file  Tobacco Use   Smoking status: Every Day    Packs/day: 1.00    Years: 30.00    Pack years: 30.00    Types: Cigarettes   Smokeless tobacco: Never  Vaping Use   Vaping Use: Never used  Substance and Sexual Activity   Alcohol use: Yes    Comment: on occasion wine   Drug use: No   Sexual activity: Not on file

## 2021-11-12 NOTE — Progress Notes (Addendum)
Ku, Saben L. ([id number]) ?Visit Report for 11/13/2021 ?Allergy List Details ?Patient Name: Date of Service: ?NORRIN, SHREFFLER. 11/13/2021 8:00 A M ?Medical Record Number: [id number] ?Patient Account Number: [id number] ?Date of Birth/Sex: Treating RN: ?1950/10/22 (71 y.o. M) ?Primary Care Thaddeus Evitts: [name] Other Clinician: ?Referring Julio Storr: ?Treating Seyon Strader/Extender: Geralyn Corwin ?[name] ?Weeks in Treatment: 0 ?Allergies ?Active Allergies ?simvastatin ?Allergy Notes ?Electronic Signature(s) ?Signed: 11/14/2021 12:10:41 PM By: Fonnie Mu RN ?Previous Signature: 11/12/2021 11:49:40 AM Version By: Karl Bales EMT ?Entered By: Fonnie Mu on 11/13/2021 08:12:02 ?-------------------------------------------------------------------------------- ?Arrival Information Details ?Patient Name: Date of Service: ?NATHANIAL, ARRIGHI. 11/13/2021 8:00 A M ?Medical Record Number: [id number] ?Patient Account Number: [id number] ?Date of Birth/Sex: Treating RN: ?May 16, 1951 (71 y.o. Charlean Merl, [name] ?Primary Care Talayah Picardi: [name] Other Clinician: ?Referring Latori Beggs: ?Treating Shelbia Scinto/Extender: Geralyn Corwin ?[name] ?Weeks in Treatment: 0 ?Visit Information ?Patient Arrived: Ambulatory ?Arrival Time: 08:10 ?Accompanied By: wife ?Transfer Assistance: None ?Patient Identification Verified: Yes ?Secondary Verification Process Completed: Yes ?Patient Requires Transmission-Based Precautions: No ?Patient Has Alerts: No ?History Since Last Visit ?Added or deleted any medications: No ?Any new allergies or adverse reactions: No ?Had a fall or experienced change in activities of daily living that may affect risk of falls: No ?Signs or symptoms of abuse/neglect since last visito No ?Hospitalized since last visit: No ?Implantable device outside of the clinic excluding cellular tissue based products placed in the center since last visit: No ?Electronic Signature(s) ?Signed: 11/14/2021 12:10:41 PM By:  Fonnie Mu RN ?Entered By: Fonnie Mu on 11/13/2021 08:10:44 ?-------------------------------------------------------------------------------- ?Clinic Level of Care Assessment Details ?Patient Name: Date of Service: ?HANSEN, CARINO. 11/13/2021 8:00 A M ?Medical Record Number: [id number] ?Patient Account Number: [id number] ?Date of Birth/Sex: Treating RN: ?1950-10-09 (71 y.o. Charlean Merl, [name] ?Primary Care Dolton Shaker: [name] Other Clinician: ?Referring Anahid Eskelson: ?Treating Kaius Daino/Extender: Geralyn Corwin ?[name] ?Weeks in Treatment: 0 ?Clinic Level of Care Assessment Items ?TOOL 3 Quantity Score ?X- 1 0 ?Use when EandM and Procedure is performed on FOLLOW-UP visit ?ASSESSMENTS - Nursing Assessment / Reassessment ?X- 1 10 ?Reassessment of Co-morbidities (includes updates in patient status) ?X- 1 5 ?Reassessment of Adherence to Treatment Plan ?ASSESSMENTS - Wound and Skin Assessment / Reassessment ?[]  - Points for Wound Assessment can only be taken for a new wound of unknown or different etiology and a procedure is ?0 ?NOT performed to that wound ?X- 1 5 ?Simple Wound Assessment / Reassessment - one wound ?[]  - 0 ?Complex Wound Assessment / Reassessment - multiple wounds ?[]  - 0 ?Dermatologic / Skin Assessment (not related to wound area) ?ASSESSMENTS - Focused Assessment ?X- 1 5 ?Circumferential Edema Measurements - multi extremities ?[]  - 0 ?Nutritional Assessment / Counseling / Intervention ?[]  - 0 ?Lower Extremity Assessment (monofilament, tuning fork, pulses) ?[]  - 0 ?Peripheral Arterial Disease Assessment (using hand held doppler) ?ASSESSMENTS - Ostomy and/or Continence Assessment and Care ?[]  - 0 ?Incontinence Assessment and Management ?[]  - 0 ?Ostomy Care Assessment and Management (repouching, etc.) ?PROCESS - Coordination of Care ?[]  - Points for Discharge Coordination can only be taken for a new wound of unknown or different etiology and a procedure ?0 ?is NOT performed to that  wound ?X- 1 15 ?Simple Patient / Family Education for ongoing care ?[]  - 0 ?Complex (extensive) Patient / Family Education for ongoing care ?X- 1 10 ?Staff obtains Consents, Records, T Results / Process Orders ?est ?[]  - 0 ?Staff telephones HHA, Nursing Homes / Clarify orders / etc ?[]  - 0 ?Routine Transfer to another  Facility (non-emergent condition) ?[]  - 0 ?Routine Hospital Admission (non-emergent condition) ?X- 1 15 ?New Admissions / / Ordering NPWT Apligraf, etc. ?, ?[]  - 0 ?Emergency Hospital Admission (emergent condition) ?X- 1 10 ?Simple Discharge Coordination ?[]  - 0 ?Complex (extensive) Discharge Coordination ?PROCESS - Special Needs ?[]  - 0 ?Pediatric / Minor Patient Management ?[]  - 0 ?Isolation Patient Management ?[]  - 0 ?Hearing / Language / Visual special needs ?[]  - 0 ?Assessment of Community assistance (transportation, D/C planning, etc.) ?[]  - 0 ?Additional assistance / Altered mentation ?[]  - 0 ?Support Surface(s) Assessment (bed, cushion, seat, etc.) ?INTERVENTIONS - Wound Cleansing / Measurement ?[]  - Points for Wound Cleaning / Measurement, Wound Dressing, Specimen Collection and Specimen taken to lab can only ?0 ?be taken for a new wound of unknown or different etiology and a procedure is NOT performed to that wound ?X- 1 5 ?Simple Wound Cleansing - one wound ?[]  - 0 ?Complex Wound Cleansing - multiple wounds ?X- 1 5 ?Wound Imaging (photographs - any number of wounds) ?[]  - 0 ?Wound Tracing (instead of photographs) ?X- 1 5 ?Simple Wound Measurement - one wound ?[]  - 0 ?Complex Wound Measurement - multiple wounds ?INTERVENTIONS - Wound Dressings ?X - Small Wound Dressing one or multiple wounds 1 10 ?[]  - 0 ?Medium Wound Dressing one or multiple wounds ?[]  - 0 ?Large Wound Dressing one or multiple wounds ?INTERVENTIONS - Miscellaneous ?[]  - 0 ?External ear exam ?[]  - 0 ?Specimen Collection (cultures, biopsies, blood, body fluids, etc.) ?[]  - 0 ?Specimen(s) / Culture(s)  sent or taken to Lab for analysis ?[]  - 0 ?Patient Transfer (multiple staff / Manufacturing engineer / Similar devices) ?[]  - 0 ?Simple Staple / Suture removal (25 or less) ?[]  - 0 ?Complex Staple / Suture removal (26 or more) ?[]  - 0 ?Hypo / Hyperglycemic Management (close monitor of Blood Glucose) ?[]  - 0 ?Ankle / Brachial Index (ABI) - do not check if billed separately ?X- 1 5 ?Vital Signs ?Has the patient been seen at the hospital within the last three years: Yes ?Total Score: 105 ?Level Of Care: New/Established - Level 3 ?Electronic Signature(s) ?Signed: 11/14/2021 12:10:41 PM By: RN ?Entered By: on 11/13/2021 08:55:23 ?-------------------------------------------------------------------------------- ?Encounter Discharge Information Details ?Patient Name: Date of Service: ?VAL, SCHIAVO. 11/13/2021 8:00 A M ?Medical Record Number: ?Patient Account Number: ?Date of Birth/Sex: Treating RN: ?05/12/1951 (71 y.o. , [name] ?Primary Care Omkar Stratmann: Other Clinician: ?Referring Yitzel Shasteen: ?Treating Candelario Steppe/Extender: ? ?Weeks in Treatment: 0 ?Encounter Discharge Information Items Post Procedure Vitals ?Discharge Condition: Stable ?Temperature (F): 97.4 ?Ambulatory Status: Ambulatory ?Pulse (bpm): 74 ?Discharge Destination: Home ?Respiratory Rate (breaths/min): 17 ?Transportation: Private Auto ?Blood Pressure (mmHg): 112/74 ?Accompanied By: self ?Schedule Follow-up Appointment: Yes ?Clinical Summary of Care: Patient Declined ?Electronic Signature(s) ?Signed: 11/14/2021 12:10:41 PM By: RN ?Entered By: on 11/13/2021 09:28:12 ?-------------------------------------------------------------------------------- ?Lower Extremity Assessment Details ?Patient Name: ?Date of Service: ?Putt, Jamir L. 11/13/2021 8:00 A M ?Medical Record Number: ?Patient Account Number: ?Date of  Birth/Sex: ?Treating RN: ?July 05, 1951 (71 y.o. Fonnie Mu, [name] ?Primary Care Tedric Leeth: Fonnie Mu ?Other Clinician: ?Referring Romelia Bromell: ?Treating Caius Silbernagel/Extender: 11/15/2021 ?[name] ?Weeks in Treatment:

## 2021-11-13 ENCOUNTER — Encounter (HOSPITAL_BASED_OUTPATIENT_CLINIC_OR_DEPARTMENT_OTHER): Payer: Medicare Other | Attending: Internal Medicine | Admitting: Internal Medicine

## 2021-11-13 ENCOUNTER — Other Ambulatory Visit: Payer: Self-pay

## 2021-11-13 DIAGNOSIS — E10621 Type 1 diabetes mellitus with foot ulcer: Secondary | ICD-10-CM | POA: Diagnosis present

## 2021-11-13 DIAGNOSIS — Z9641 Presence of insulin pump (external) (internal): Secondary | ICD-10-CM | POA: Diagnosis not present

## 2021-11-13 DIAGNOSIS — M86571 Other chronic hematogenous osteomyelitis, right ankle and foot: Secondary | ICD-10-CM | POA: Diagnosis not present

## 2021-11-13 DIAGNOSIS — E1051 Type 1 diabetes mellitus with diabetic peripheral angiopathy without gangrene: Secondary | ICD-10-CM | POA: Diagnosis not present

## 2021-11-13 DIAGNOSIS — Z89512 Acquired absence of left leg below knee: Secondary | ICD-10-CM | POA: Insufficient documentation

## 2021-11-13 DIAGNOSIS — E11621 Type 2 diabetes mellitus with foot ulcer: Secondary | ICD-10-CM | POA: Diagnosis not present

## 2021-11-13 DIAGNOSIS — L97512 Non-pressure chronic ulcer of other part of right foot with fat layer exposed: Secondary | ICD-10-CM | POA: Diagnosis not present

## 2021-11-14 NOTE — Progress Notes (Signed)
Daniello, Maxamillion L. ([id number]) ?Visit Report for 11/13/2021 ?Chief Complaint Document Details ?Patient Name: Date of Service: ?DECLYN, DELSOL. 11/13/2021 8:00 A M ?Medical Record Number: [id number] ?Patient Account Number: [id number] ?Date of Birth/Sex: Treating RN: ?1950/12/21 (71 y.o. M) ?Primary Care Provider: [name] Other Clinician: ?Referring Provider: ?Treating Provider/Extender: Geralyn Corwin ?[name] ?Weeks in Treatment: 0 ?Information Obtained from: Patient ?Chief Complaint ?Right great toe wound ?Electronic Signature(s) ?Signed: 11/13/2021 9:27:18 AM By: Geralyn Corwin DO ?Entered By: Geralyn Corwin on 11/13/2021 09:08:58 ?-------------------------------------------------------------------------------- ?Debridement Details ?Patient Name: Date of Service: ?YARIEL, FERRARIS. 11/13/2021 8:00 A M ?Medical Record Number: [id number] ?Patient Account Number: [id number] ?Date of Birth/Sex: Treating RN: ?12/16/1950 (71 y.o. Charlean Merl, [name] ?Primary Care Provider: [name] Other Clinician: ?Referring Provider: ?Treating Provider/Extender: Geralyn Corwin ?[name] ?Weeks in Treatment: 0 ?Debridement Performed for Assessment: Wound #2 Right,Plantar T Great ?oe ?Performed By: Physician Geralyn Corwin, DO ?Debridement Type: Debridement ?Severity of Tissue Pre Debridement: Fat layer exposed ?Level of Consciousness (Pre-procedure): Awake and Alert ?Pre-procedure Verification/Time Out Yes - 08:40 ?Taken: ?Start Time: 08:40 ?Pain Control: Lidocaine ?T Area Debrided (L x W): ?otal 0.5 (cm) x 0.5 (cm) = 0.25 (cm?) ?Tissue and other material debrided: Viable, Non-Viable, [place], [place] ?Level: Non-Viable Tissue ?Debridement Description: Selective/Open Wound ?Instrument: Curette ?Bleeding: Minimum ?Hemostasis Achieved: Pressure ?End Time: 08:40 ?Procedural Pain: 0 ?Post Procedural Pain: 0 ?Response to Treatment: Procedure was tolerated well ?Level of Consciousness (Post- Awake and  Alert ?procedure): ?Post Debridement Measurements of Total Wound ?Length: (cm) 0.5 ?Width: (cm) 0.5 ?Depth: (cm) 0.3 ?Volume: (cm?) 0.059 ?Character of Wound/Ulcer Post Debridement: Improved ?Severity of Tissue Post Debridement: Fat layer exposed ?Post Procedure Diagnosis ?Same as Pre-procedure ?Electronic Signature(s) ?Signed: 11/13/2021 9:27:18 AM By: Geralyn Corwin DO ?Signed: 11/14/2021 12:10:41 PM By: Fonnie Mu RN ?Entered By: Fonnie Mu on 11/13/2021 08:56:41 ?-------------------------------------------------------------------------------- ?HPI Details ?Patient Name: Date of Service: ?DRACEN, REIGLE. 11/13/2021 8:00 A M ?Medical Record Number: [id number] ?Patient Account Number: [id number] ?Date of Birth/Sex: Treating RN: ?Jun 29, 1951 (71 y.o. 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 He is a type I diabetic on an insulin pump diagnosed at age 89. He also ?has known peripheral neuropathy. He has been dealing with a wound on his right first toe since at least February by review of epic. He was seen by Dr. Lajoyce Corners. ?Diagnosed with an ischemic wound sent to see Dr. Myra Gianotti him who noted a noncompressible ABI 1.67 and a TBI of 0.25 monophasic waveforms. On ?11/15/2019 he underwent a right femoral to below-knee popliteal bypass with an ipsilateral saphenous vein. He also underwent extensive endarterectomy I ?believe that the external iliac. Postoperative ABI was 1.16 he has been using Neosporin on this and gradually making progress according to his companion. He ?is offloading this with a hole in the inserts in his work shoes. Per his companion who does the dressings the wound has been getting smaller ?Past medical history; type 1 diabetes  with PAD and PN, history of a left BKA in 2017, coronary artery disease, hypercholesterolemia, hypertension he is a ?smoker ?6/29; this is a patient I have not seen in 4 weeks. He is a type I diabetic on an insulin pump with a prior left BKA. He was revascularized before he came to our ?clinic with a right femoral to  below-knee popliteal bypass with a saphenous vein. He also had an extensive endarterectomy. We used Hydrofera Blue to this ?wound. 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 Wears work boots. ?7/13; type I diabetic with an area on the plantar tip of his right great toe in the setting of a previous left BKA and extensive endarterectomy with a right femoral ?to below-knee popliteal bypass before he came to our clinic. We have been using Hydrofera Blue the wound is measuring small. He is still a very active man ?working on his own farm there just is not a way for him to offload this ?7/27; type I diabetic with a plantar wound on the tip of his right great toe. Previous left BKA. He is also had an extensive endarterectomy with a right femoral to ?below-knee popliteal bypass before he came here. We have been using Hydrofera Blue. Nice improvements in the wound which is small and superficial now. ?He has an appointment tomorrow with [facility]. I think he made this on his own but we have talked about this last week. He has a hammer ?deformity of the toe. I also wondered if they could be helpful in modifying his foot wear. He is an active man still works and manages his own farm ?8/10-Patient returns at 2 weeks, wearing left prosthesis after BKA, using Hydrofera Blue on the right plantar great toe wound which is measuring bigger today, ?patient does do a fair amount of work outdoors and apparently sold 2 cattle recently and must of been on his feet a whole lot more. For the next 3 weeks he is ?traveling with his wife and therefore will not be outdoors as much on his farm. ?8/17; patient returns to  clinic today with a much bigger wound than what I was used to seeing 3 weeks ago. I have looked through his arterial studies from 7/12. ?At that point he had noncompressible ABIs at 1.72 biphasic waveforms at the PTA and monophasic waveforms at the dorsalis pedis. Unfortunately they did ?not do a TBI on the great toe because it was bandaged. The interpretation however suggested waveforms showed adequate perfusion. The patient states that ?he saw Dr. Myra Gianotti about a month ago, I will see if I can pull this record. As noted previously the patient is a very active man works with a left below-knee ?prosthesis. The great toe has a bit of a hammer deformity to it. He is trying to offload this using modified insoles in his shoes i.e. cutting out a part of this to ?lift the toe off the bottom of his shoe. ?8/23; culture grew abundant amount of Enterobacter and a few methicillin sensitive staph aureus. I empirically put him on Levaquin in response to this although ?we were not able to get a hold of him by phone to let them know that the antibiotic was at his pharmacy therefore he has not started it yet. I encouraged him to ?start today. The wound is larger, a lot of drainage. There is nothing that probes to bone however we are certainly not making any progress. He is attempting to ?modify his foot wear. 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 I looked at this x-ray indeed it does look ?damaged. He reminds me that he has rods and screws in his left arm from remote trauma. He will not be a candidate for an MRI. Nevertheless I think it is ?important to  be sure of the diagnosis here. The patient also has severe PAD. Amputation of parts of the total toe may be necessary if this does not heal. He ?started Levaquin last week. Using silver alginate on the wound ?9/13; we still do not have a CT scan. He is completing 2 weeks of Levaquin and 2 weeks of Flagyl. Most of  his complaints of side effects I think are related to ?the Flagyl but that can stop as of when he finishes his medications either later today or tomorrow. ?He tells me that he has been using Neosporin on this for the last wee

## 2021-11-14 NOTE — Progress Notes (Signed)
Negro, Jovon L. ([id number]) ?Visit Report for 11/13/2021 ?Abuse Risk Screen Details ?Patient Name: Date of Service: ?LAM, MCCUBBINS. 11/13/2021 8:00 A M ?Medical Record Number: [id number] ?Patient Account Number: [id number] ?Date of Birth/Sex: Treating RN: ?1950/11/15 (71 y.o. Charlean Merl, [name] ?Primary Care Ariely Riddell: [name] Other Clinician: ?Referring Nida Manfredi: ?Treating Bowie Doiron/Extender: Geralyn Corwin ?[name] ?Weeks in Treatment: 0 ?Abuse Risk Screen Items ?Answer ?ABUSE RISK SCREEN: ?Has anyone close to you tried to hurt or harm you recentlyo No ?Do you feel uncomfortable with anyone in your familyo No ?Has anyone forced you do things that you didnt want to doo No ?Electronic Signature(s) ?Signed: 11/14/2021 12:10:41 PM By: Fonnie Mu RN ?Entered By: Fonnie Mu on 11/13/2021 08:12:34 ?-------------------------------------------------------------------------------- ?Activities of Daily Living Details ?Patient Name: Date of Service: ?FREDDRICK, GLADSON. 11/13/2021 8:00 A M ?Medical Record Number: [id number] ?Patient Account Number: [id number] ?Date of Birth/Sex: Treating RN: ?March 29, 1951 (71 y.o. Charlean Merl, [name] ?Primary Care Lido Maske: [name] Other Clinician: ?Referring Edye Hainline: ?Treating Rosealyn Little/Extender: Geralyn Corwin ?[name] ?Weeks in Treatment: 0 ?Activities of Daily Living Items ?Answer ?Activities of Daily Living (Please select one for each item) ?Drive Automobile Completely Able ?T Medications ?ake Completely Able ?Use T elephone Completely Able ?Care for Appearance Completely Able ?Use T oilet Completely Able ?Bath / Shower Completely Able ?Dress Self Completely Able ?Feed Self Completely Able ?Walk Completely Able ?Get In / Out Bed Completely Able ?Housework Completely Able ?Prepare Meals Completely Able ?Handle Money Completely Able ?Shop for Self Completely Able ?Electronic Signature(s) ?Signed: 11/14/2021 12:10:41 PM By: Fonnie Mu RN ?Entered By:  Fonnie Mu on 11/13/2021 08:12:59 ?-------------------------------------------------------------------------------- ?Education Screening Details ?Patient Name: ?Date of Service: ?Coggin, Alexande L. 11/13/2021 8:00 A M ?Medical Record Number: [id number] ?Patient Account Number: [id number] ?Date of Birth/Sex: ?Treating RN: ?June 23, 1951 (71 y.o. Charlean Merl, [name] ?Primary Care Minda Faas: [name] ?Other Clinician: ?Referring Siyah Mault: ?Treating Corey Caulfield/Extender: Geralyn Corwin ?[name] ?Weeks in Treatment: 0 ?Primary Learner Assessed: Patient ?Learning Preferences/Education Level/Primary Language ?Learning Preference: Explanation, Demonstration, Communication Board, Printed Material ?Highest Education Level: High School ?Preferred Language: English ?Cognitive Barrier ?Language Barrier: No ?Translator Needed: No ?Memory Deficit: No ?Emotional Barrier: No ?Cultural/Religious Beliefs Affecting Medical Care: No ?Physical Barrier ?Impaired Vision: No ?Impaired Hearing: No ?Decreased Hand dexterity: No ?Knowledge/Comprehension ?Knowledge Level: High ?Comprehension Level: High ?Ability to understand written instructions: High ?Ability to understand verbal instructions: High ?Motivation ?Anxiety Level: Calm ?Cooperation: Cooperative ?Education Importance: Denies Need ?Interest in Health Problems: Asks Questions ?Perception: Coherent ?Willingness to Engage in Self-Management High ?Activities: ?Readiness to Engage in Self-Management High ?Activities: ?Electronic Signature(s) ?Signed: 11/14/2021 12:10:41 PM By: Fonnie Mu RN ?Entered By: Fonnie Mu on 11/13/2021 08:13:35 ?-------------------------------------------------------------------------------- ?Fall Risk Assessment Details ?Patient Name: ?Date of Service: ?Elliston, Matthan L. 11/13/2021 8:00 A M ?Medical Record Number: [id number] ?Patient Account Number: [id number] ?Date of Birth/Sex: ?Treating RN: ?21-Sep-1950 (71 y.o. Charlean Merl, [name] ?Primary  Care Tatsuya Okray: [name] ?Other Clinician: ?Referring Benuel Ly: ?Treating Nishaan Stanke/Extender: Geralyn Corwin ?[name] ?Weeks in Treatment: 0 ?Fall Risk Assessment Items ?Have you had 2 or more falls in the last 12 monthso 0 No ?Have you had any fall that resulted in injury in the last 12 monthso 0 No ?FALLS RISK SCREEN ?History of falling - immediate or within 3 months 0 No ?Secondary diagnosis (Do you have 2 or more medical diagnoseso) 0 No ?Ambulatory aid ?None/bed rest/wheelchair/nurse 0 No ?Crutches/cane/walker 0 No ?Furniture 0 No ?Intravenous therapy Access/Saline/Heparin Lock 0 No ?Gait/Transferring ?Normal/ bed rest/ wheelchair 0 No ?Weak (short steps with or  without shuffle, stooped but able to lift head while walking, may seek 0 No ?support from furniture) ?Impaired (short steps with shuffle, may have difficulty arising from chair, head down, impaired 0 No ?balance) ?Mental Status ?Oriented to own ability 0 No ?Electronic Signature(s) ?Signed: 11/14/2021 12:10:41 PM By: Fonnie Mu RN ?Entered By: Fonnie Mu on 11/13/2021 08:13:43 ?-------------------------------------------------------------------------------- ?Foot Assessment Details ?Patient Name: ?Date of Service: ?Insley, Termaine L. 11/13/2021 8:00 A M ?Medical Record Number: [id number] ?Patient Account Number: [id number] ?Date of Birth/Sex: ?Treating RN: ?01-26-1951 (71 y.o. Charlean Merl, [name] ?Primary Care Brooksie Ellwanger: [name] ?Other Clinician: ?Referring Taishaun Levels: ?Treating Mazelle Huebert/Extender: Geralyn Corwin ?[name] ?Weeks in Treatment: 0 ?Foot Assessment Items ?Site Locations ?+ = Sensation present, - = Sensation absent, C = Callus, U = Ulcer ?R = Redness, W = Warmth, M = Maceration, PU = Pre-ulcerative lesion ?F = Fissure, S = Swelling, D = Dryness ?Assessment ?Right: Left: ?Other Deformity: No No ?Prior Foot Ulcer: Yes Yes ?Prior Amputation: No Yes ?Charcot Joint: No No ?Ambulatory Status: Ambulatory Without Help ?Gait:  Steady ?Electronic Signature(s) ?Signed: 11/14/2021 12:10:41 PM By: Fonnie Mu RN ?Entered By: Fonnie Mu on 11/13/2021 08:14:20 ?-------------------------------------------------------------------------------- ?Nutrition Risk Screening Details ?Patient Name: ?Date of Service: ?Kovaleski, Jayceon L. 11/13/2021 8:00 A M ?Medical Record Number: [id number] ?Patient Account Number: [id number] ?Date of Birth/Sex: ?Treating RN: ?02/25/1951 (71 y.o. Charlean Merl, [name] ?Primary Care Piper Albro: [name] ?Other Clinician: ?Referring Asami Lambright: ?Treating Jenicka Coxe/Extender: Geralyn Corwin ?[name] ?Weeks in Treatment: 0 ?Height (in): 69 ?Weight (lbs): 155 ?Body Mass Index (BMI): 22.9 ?Nutrition Risk Screening Items ?Score Screening ?NUTRITION RISK SCREEN: ?I have an illness or condition that made me change the kind and/or amount of food I eat 0 No ?I eat fewer than two meals per day 0 No ?I eat few fruits and vegetables, or milk products 0 No ?I have three or more drinks of beer, liquor or wine almost every day 0 No ?I have tooth or mouth problems that make it hard for me to eat 0 No ?I don't always have enough money to buy the food I need 0 No ?I eat alone most of the time 0 No ?I take three or more different prescribed or over-the-counter drugs a day 0 No ?Without wanting to, I have lost or gained 10 pounds in the last six months 0 No ?I am not always physically able to shop, cook and/or feed myself 0 No ?Nutrition Protocols ?Good Risk Protocol 0 No interventions needed ?Moderate Risk Protocol ?High Risk Proctocol ?Risk Level: Good Risk ?Score: 0 ?Electronic Signature(s) ?Signed: 11/14/2021 12:10:41 PM By: Fonnie Mu RN ?Entered By: Fonnie Mu on 11/13/2021 08:13:48 ?

## 2021-11-24 ENCOUNTER — Encounter (HOSPITAL_BASED_OUTPATIENT_CLINIC_OR_DEPARTMENT_OTHER): Payer: Medicare Other | Attending: Internal Medicine | Admitting: Internal Medicine

## 2021-11-24 DIAGNOSIS — E1040 Type 1 diabetes mellitus with diabetic neuropathy, unspecified: Secondary | ICD-10-CM | POA: Diagnosis not present

## 2021-11-24 DIAGNOSIS — E11621 Type 2 diabetes mellitus with foot ulcer: Secondary | ICD-10-CM | POA: Diagnosis not present

## 2021-11-24 DIAGNOSIS — L97512 Non-pressure chronic ulcer of other part of right foot with fat layer exposed: Secondary | ICD-10-CM | POA: Diagnosis not present

## 2021-11-24 DIAGNOSIS — Z89512 Acquired absence of left leg below knee: Secondary | ICD-10-CM | POA: Insufficient documentation

## 2021-11-24 DIAGNOSIS — M86571 Other chronic hematogenous osteomyelitis, right ankle and foot: Secondary | ICD-10-CM | POA: Insufficient documentation

## 2021-11-24 DIAGNOSIS — E10621 Type 1 diabetes mellitus with foot ulcer: Secondary | ICD-10-CM | POA: Insufficient documentation

## 2021-11-24 DIAGNOSIS — I2510 Atherosclerotic heart disease of native coronary artery without angina pectoris: Secondary | ICD-10-CM | POA: Insufficient documentation

## 2021-11-24 DIAGNOSIS — Z794 Long term (current) use of insulin: Secondary | ICD-10-CM | POA: Diagnosis not present

## 2021-11-24 DIAGNOSIS — E1051 Type 1 diabetes mellitus with diabetic peripheral angiopathy without gangrene: Secondary | ICD-10-CM | POA: Diagnosis not present

## 2021-11-24 DIAGNOSIS — Z9641 Presence of insulin pump (external) (internal): Secondary | ICD-10-CM | POA: Insufficient documentation

## 2021-11-24 DIAGNOSIS — I10 Essential (primary) hypertension: Secondary | ICD-10-CM | POA: Diagnosis not present

## 2021-11-24 DIAGNOSIS — E7800 Pure hypercholesterolemia, unspecified: Secondary | ICD-10-CM | POA: Diagnosis not present

## 2021-11-24 NOTE — Progress Notes (Addendum)
Routson, Fadi L. ([id number]) ?Visit Report for 11/24/2021 ?Chief Complaint Document Details ?Patient Name: Date of Service: ?[name], [name]. 11/24/2021 9:30 A M ?Medical Record Number: [id number] ?Patient Account Number: [id number] ?Date of Birth/Sex: Treating RN: ?1951/06/12 (71 y.o. M) ?Primary Care Provider: [name] Other Clinician: ?Referring Provider: ?Treating Provider/Extender: Geralyn Corwin ?[name] ?Weeks in Treatment: 1 ?Information Obtained from: Patient ?Chief Complaint ?Right great toe wound ?Electronic Signature(s) ?Signed: 11/24/2021 12:22:18 PM By: Geralyn Corwin DO ?Entered By: Geralyn Corwin on 11/24/2021 11:59:02 ?-------------------------------------------------------------------------------- ?HPI Details ?Patient Name: Date of Service: ?[name], [name]. 11/24/2021 9:30 A M ?Medical Record Number: [id number] ?Patient Account Number: [id number] ?Date of Birth/Sex: Treating RN: ?1950/09/06 (71 y.o. 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 He is a type I diabetic on an insulin pump diagnosed at age 12. He also ?has known peripheral neuropathy. He has been dealing with a wound on his right first toe since at least February by review of epic. He was seen by Dr. Lajoyce Corners. ?Diagnosed with an ischemic wound sent to see Dr. Myra Gianotti him who noted a noncompressible ABI 1.67 and a TBI of 0.25 monophasic waveforms. On ?11/15/2019 he underwent a right femoral to below-knee popliteal bypass with an ipsilateral saphenous vein. He also underwent extensive endarterectomy I ?believe that the external iliac. Postoperative ABI was 1.16 he has been using Neosporin on this and gradually making progress according to his companion. He ?is offloading  this with a hole in the inserts in his work shoes. Per his companion who does the dressings the wound has been getting smaller ?Past medical history; type 1 diabetes with PAD and PN, history of a left BKA in 2017, coronary artery disease, hypercholesterolemia, hypertension he is a ?smoker ?6/29; this is a patient I have not seen in 4 weeks. He is a type I diabetic on an insulin pump with a prior left BKA. He was revascularized before he came to our ?clinic with a right femoral to below-knee popliteal bypass with a saphenous vein. He also had an extensive endarterectomy. We used Hydrofera Blue to this ?wound. 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 Wears work boots. ?7/13; type I diabetic with an area on the plantar tip of his right great toe in the setting of a previous left BKA and extensive endarterectomy with a right femoral ?to below-knee popliteal bypass before he came to our clinic. We have been using Hydrofera Blue the wound is measuring small. He is still a very active man ?working on his own farm there just is not a way for him to offload this ?7/27; type I diabetic with a plantar wound on the tip of his right great toe. Previous left BKA. He is also had an extensive endarterectomy with a right femoral to ?below-knee popliteal bypass before he came here. We have been using Hydrofera Blue. Nice improvements in the wound which is small and superficial now. ?He has an appointment tomorrow with [facility]. I think he made this on his own but we have talked about this last week. He has a hammer ?deformity of the toe. I also wondered if they could be helpful in modifying his foot wear. He is an active man still works and manages his own farm ?8/10-Patient returns at 2 weeks,  wearing left prosthesis after BKA, using Hydrofera Blue on the right plantar great toe wound which is measuring bigger today, ?patient does do a fair amount of work outdoors and apparently sold 2 cattle recently and must of been on  his feet a whole lot more. For the next 3 weeks he is ?traveling with his wife and therefore will not be outdoors as much on his farm. ?8/17; patient returns to clinic today with a much bigger wound than what I was used to seeing 3 weeks ago. I have looked through his arterial studies from 7/12. ?At that point he had noncompressible ABIs at 1.72 biphasic waveforms at the PTA and monophasic waveforms at the dorsalis pedis. Unfortunately they did ?not do a TBI on the great toe because it was bandaged. The interpretation however suggested waveforms showed adequate perfusion. The patient states that ?he saw Dr. Myra GianottiBrabham about a month ago, I will see if I can pull this record. As noted previously the patient is a very active man works with a left below-knee ?prosthesis. The great toe has a bit of a hammer deformity to it. He is trying to offload this using modified insoles in his shoes i.e. cutting out a part of this to ?lift the toe off the bottom of his shoe. ?8/23; culture grew abundant amount of Enterobacter and a few methicillin sensitive staph aureus. I empirically put him on Levaquin in response to this although ?we were not able to get a hold of him by phone to let them know that the antibiotic was at his pharmacy therefore he has not started it yet. I encouraged him to ?start today. The wound is larger, a lot of drainage. There is nothing that probes to bone however we are certainly not making any progress. He is attempting to ?modify his foot wear. 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 I looked at this x-ray indeed it does look ?damaged. He reminds me that he has rods and screws in his left arm from remote trauma. He will not be a candidate for an MRI. Nevertheless I think it is ?important to be sure of the diagnosis here. The patient also has severe PAD. Amputation of parts of the total toe may be necessary if this does not heal. He ?started  Levaquin last week. Using silver alginate on the wound ?9/13; we still do not have a CT scan. He is completing 2 weeks of Levaquin and 2 weeks of Flagyl. Most of his complaints of side effects I think are related to ?the Flagyl but that can stop as of when he finishes his medications either later today or tomorrow. ?He tells me that he has been using Neosporin on this for the last week, thinking that the silver alginate causes drainage. I do not know told him I thought this ?would be unlikely. It looks like he is offloading this better ?9/28. Very small improvement in measurements. Surface looks reasonably healthy. He has finished 4 weeks of Levaquin and 2 weeks of Flagyl. He has a CT ?scan on October 5. [facility] ?10/4; dimensions about the same however the wound looks very healthy. ?We are using Hydrofera Blue. Felt offloading. 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 He has 1 more week of Levaquin CT scan tomorrow ?10/11; wound is contracting surface looks very healthy we have been using Hydrofera Blue ?His CT scan showed osteomyelitis of the distal tuft  of the great toe this is what I thought it would show fortunately does not show any damage beyond what I ?thought. Also noted that she he had advanced neuropathic change at the Lisfranc joint with osteophytes and heterotrophic calcification. There was no evidence ?of soft tissue emphysema. He is completing his Levaquin I gave him 2 weeks of Flagyl as well I have told him I think we should wait and see now. If we can ?offload this enough for him to heal. we will watch ?10/25; dimensions of the wound are down dramatically. Epithelialization however the epithelialization still looks somewhat vulnerable. His wife says he has been ?up on the tractor recently which she thinks has offloaded the wound. They are also using a felt donut ?11/8; the wound is fully epithelialized. He completed 6 weeks of Levaquin and 2 weeks of  Flagyl. ?Readmission 11/13/2021 ?[name] is a 71 year old male with a past medical history of type 1 diabetes on an insulin pump, peripheral neuropathy, right femoral below the knee ?popliteal bypass with an ip

## 2021-11-24 NOTE — Progress Notes (Signed)
Stifter, Oluwademilade L. ([id number]) ?Visit Report for 11/24/2021 ?Arrival Information Details ?Patient Name: Date of Service: ?[name], [name]. 11/24/2021 9:30 A M ?Medical Record Number: [id number] ?Patient Account Number: [id number] ?Date of Birth/Sex: Treating RN: ?08-Aug-1951 (71 y.o. Charlean Merl, [name] ?Primary Care Marilu Rylander: [name] Other Clinician: ?Referring Teola Felipe: ?Treating Amarya Kuehl/Extender: Geralyn Corwin ?[name] ?Weeks in Treatment: 1 ?Visit Information History Since Last Visit ?Added or deleted any medications: No ?Patient Arrived: Ambulatory ?Any new allergies or adverse reactions: No ?Arrival Time: 09:58 ?Had a fall or experienced change in No ?Accompanied By: wife ?activities of daily living that may affect ?Transfer Assistance: None ?risk of falls: ?Patient Identification Verified: Yes ?Signs or symptoms of abuse/neglect since last visito No ?Secondary Verification Process Completed: Yes ?Hospitalized since last visit: No ?Patient Requires Transmission-Based Precautions: No ?Implantable device outside of the clinic excluding No ?Patient Has Alerts: No ?cellular tissue based products placed in the center ?since last visit: ?Has Dressing in Place as Prescribed: Yes ?Pain Present Now: No ?Electronic Signature(s) ?Signed: 11/24/2021 3:48:01 PM By: Fonnie Mu RN ?Entered By: Fonnie Mu on 11/24/2021 10:00:25 ?-------------------------------------------------------------------------------- ?Clinic Level of Care Assessment Details ?Patient Name: Date of Service: ?[name], [name]. 11/24/2021 9:30 A M ?Medical Record Number: [id number] ?Patient Account Number: [id number] ?Date of Birth/Sex: Treating RN: ?1951/02/04 (71 y.o. Charlean Merl, [name] ?Primary Care Roston Grunewald: [name] Other Clinician: ?Referring Gibran Veselka: ?Treating Sienna Stonehocker/Extender: Geralyn Corwin ?[name] ?Weeks in Treatment: 1 ?Clinic Level of Care Assessment Items ?TOOL 4 Quantity Score ?X- 1 0 ?Use when only an EandM is  performed on FOLLOW-UP visit ?ASSESSMENTS - Nursing Assessment / Reassessment ?X- 1 10 ?Reassessment of Co-morbidities (includes updates in patient status) ?X- 1 5 ?Reassessment of Adherence to Treatment Plan ?ASSESSMENTS - Wound and Skin A ssessment / Reassessment ?X - Simple Wound Assessment / Reassessment - one wound 1 5 ?[]  - 0 ?Complex Wound Assessment / Reassessment - multiple wounds ?[]  - 0 ?Dermatologic / Skin Assessment (not related to wound area) ?ASSESSMENTS - Focused Assessment ?X- 1 5 ?Circumferential Edema Measurements - multi extremities ?[]  - 0 ?Nutritional Assessment / Counseling / Intervention ?[]  - 0 ?Lower Extremity Assessment (monofilament, tuning fork, pulses) ?[]  - 0 ?Peripheral Arterial Disease Assessment (using hand held doppler) ?ASSESSMENTS - Ostomy and/or Continence Assessment and Care ?[]  - 0 ?Incontinence Assessment and Management ?[]  - 0 ?Ostomy Care Assessment and Management (repouching, etc.) ?PROCESS - Coordination of Care ?X - Simple Patient / Family Education for ongoing care 1 15 ?[]  - 0 ?Complex (extensive) Patient / Family Education for ongoing care ?X- 1 10 ?Staff obtains Consents, Records, T Results / Process Orders ?est ?[]  - 0 ?Staff telephones HHA, Nursing Homes / Clarify orders / etc ?[]  - 0 ?Routine Transfer to another Facility (non-emergent condition) ?[]  - 0 ?Routine Hospital Admission (non-emergent condition) ?[]  - 0 ?New Admissions / / Ordering NPWT Apligraf, etc. ?, ?[]  - 0 ?Emergency Hospital Admission (emergent condition) ?X- 1 10 ?Simple Discharge Coordination ?[]  - 0 ?Complex (extensive) Discharge Coordination ?PROCESS - Special Needs ?[]  - 0 ?Pediatric / Minor Patient Management ?[]  - 0 ?Isolation Patient Management ?[]  - 0 ?Hearing / Language / Visual special needs ?[]  - 0 ?Assessment of Community assistance (transportation, D/C planning, etc.) ?[]  - 0 ?Additional assistance / Altered mentation ?[]  - 0 ?Support Surface(s) Assessment  (bed, cushion, seat, etc.) ?INTERVENTIONS - Wound Cleansing / Measurement ?X - Simple Wound Cleansing - one wound 1 5 ?[]  - 0 ?Complex Wound Cleansing - multiple wounds ?X-  1 5 ?Wound Imaging (photographs - any number of wounds) ?[]  - 0 ?Wound Tracing (instead of photographs) ?X- 1 5 ?Simple Wound Measurement - one wound ?[]  - 0 ?Complex Wound Measurement - multiple wounds ?INTERVENTIONS - Wound Dressings ?X - Small Wound Dressing one or multiple wounds 1 10 ?[]  - 0 ?Medium Wound Dressing one or multiple wounds ?[]  - 0 ?Large Wound Dressing one or multiple wounds ?X- 1 5 ?Application of Medications - topical ?[]  - 0 ?Application of Medications - injection ?INTERVENTIONS - Miscellaneous ?[]  - 0 ?External ear exam ?[]  - 0 ?Specimen Collection (cultures, biopsies, blood, body fluids, etc.) ?[]  - 0 ?Specimen(s) / Culture(s) sent or taken to Lab for analysis ?[]  - 0 ?Patient Transfer (multiple staff / / Similar devices) ?[]  - 0 ?Simple Staple / Suture removal (25 or less) ?[]  - 0 ?Complex Staple / Suture removal (26 or more) ?[]  - 0 ?Hypo / Hyperglycemic Management (close monitor of Blood Glucose) ?[]  - 0 ?Ankle / Brachial Index (ABI) - do not check if billed separately ?X- 1 5 ?Vital Signs ?Has the patient been seen at the hospital within the last three years: Yes ?Total Score: 95 ?Level Of Care: New/Established - Level 3 ?Electronic Signature(s) ?Signed: 11/24/2021 3:48:01 PM By: RN ?Entered By: on 11/24/2021 10:35:57 ?-------------------------------------------------------------------------------- ?Encounter Discharge Information Details ?Patient Name: Date of Service: ?[name], [name]. 11/24/2021 9:30 A M ?Medical Record Number: ?Patient Account Number: ?Date of Birth/Sex: Treating RN: ?04-24-1951 (71 y.o. , [name] ?Primary Care Deontre Allsup: Other Clinician: ?Referring Ellison Rieth: ?Treating Raeshawn Vo/Extender: ?01/24/2022 ?Weeks in Treatment: 1 ?Encounter Discharge Information Items ?Discharge Condition: Stable ?Ambulatory Status: Ambulatory ?Discharge Destination: Home ?Transportation: Private Auto ?Accompanied By: wife ?Schedule Follow-up Appointment: Yes ?Clinical Summary of Care: Patient Declined ?Electronic Signature(s) ?Signed: 11/24/2021 3:48:01 PM By: Fonnie Mu RN ?Entered By: 01/24/2022 on 11/24/2021 10:38:54 ?-------------------------------------------------------------------------------- ?Lower Extremity Assessment Details ?Patient Name: Date of Service: ?[name], [name]. 11/24/2021 9:30 A M ?Medical Record Number: [id number] ?Patient Account Number: 07/19/1951 ?Date of Birth/Sex: Treating RN: ?02-23-1951 (71 y.o. [name], [name] ?Primary Care Blossie Raffel: Geralyn Corwin Other Clinician: ?Referring Braylei Totino: ?Treating Deundre Thong/Extender: [name] ?01/24/2022 ?Weeks in Treatment: 1 ?Edema Assessment ?Assessed: [Left: No] [Right: Yes] ?Edema: [Left: N] [Right: o] ?Calf ?Left: Right: ?Point of Measurement: From Medial Instep 33 cm ?Ankle ?Left: Right: ?Point of Measurement: From Medial Instep 22 cm ?Vascular Assessment ?Pulses: ?Dorsalis Pedis ?Palpable: [Right:Yes] ?Posterior Tibial ?Palpable: [Right:Yes] ?Electronic Signature(s) ?Signed: 11/24/2021 3:48:01 PM By: Fonnie Mu RN ?Entered By: 01/24/2022 on 11/24/2021 10:03:56 ?-------------------------------------------------------------------------------- ?Multi Wound Chart Details ?Patient Name: ?Date of Service: ?[name], [name]. 11/24/2021 9:30 A M ?Medical Record Number: [id number] ?Patient Account Number: 07/19/1951 ?Date of Birth/Sex: ?Treating RN: ?01-03-1951 (71 y.o. M) ?Primary Care Larosa Rhines: [name] ?Other Clinician: ?Referring Sladen Plancarte: ?Treating Tayshawn Purnell/Extender: Geralyn Corwin ?[name] ?Weeks in Treatment: 1 ?Vital Signs ?Height(in): 69 ?Pulse(bpm): 57 ?Weight(lbs): 155 ?Blood Pressure(mmHg): 149/65 ?Body Mass Index(BMI):  22.9 ?Temperature(??F): 98.1 ?Respiratory Rate(breaths/min): 17 ?Photos: [N/A:N/A] ?Right, Plantar T [place] N/A N/A ?Wound Location: ?Gradually Appeared N/A N/A ?Wounding Event: ?Diabetic Wound/Ulcer o

## 2021-12-02 ENCOUNTER — Encounter (HOSPITAL_BASED_OUTPATIENT_CLINIC_OR_DEPARTMENT_OTHER): Payer: Medicare Other | Admitting: Internal Medicine

## 2021-12-02 DIAGNOSIS — E10621 Type 1 diabetes mellitus with foot ulcer: Secondary | ICD-10-CM | POA: Diagnosis not present

## 2021-12-05 NOTE — Progress Notes (Addendum)
[name], [name] ([id number]) ?Visit Report for 12/02/2021 ?HPI Details ?Patient Name: Date of Service: ?[name], [name] 12/02/2021 9:30 A M ?Medical Record Number: [id number] ?Patient Account Number: [id number] ?Date of Birth/Sex: Treating RN: ?[birth year](70 y.o. 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 He is a type I diabetic on an insulin pump diagnosed at age 89. He also ?has known peripheral neuropathy. He has been dealing with a wound on his right first toe since at least February by review of epic. He was seen by Dr. Lajoyce Cornersuda. ?Diagnosed with an ischemic wound sent to see Dr. Myra GianottiBrabham him who noted a noncompressible ABI 1.67 and a TBI of 0.25 monophasic waveforms. On ?11/15/2019 he underwent a right femoral to below-knee popliteal bypass with an ipsilateral saphenous vein. He also underwent extensive endarterectomy I ?believe that the external iliac. Postoperative ABI was 1.16 he has been using Neosporin on this and gradually making progress according to his companion. He ?is offloading this with a hole in the inserts in his work shoes. Per his companion who does the dressings the wound has been getting smaller ?Past medical history; type 1 diabetes with PAD and PN, history of a left BKA in 2017, coronary artery disease, hypercholesterolemia, hypertension he is a ?smoker ?6/29; this is a patient I have not seen in 4 weeks. He is a type I diabetic on an insulin pump with a prior left BKA. He was revascularized before he came to our ?clinic with a right femoral to below-knee popliteal bypass with a saphenous vein. He also had an extensive endarterectomy. We used Hydrofera Blue to this ?wound. 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  Wears work boots. ?7/13; type I diabetic with an area on the plantar tip of his right great toe in the setting of a previous left BKA and extensive endarterectomy with a right femoral ?to below-knee popliteal bypass before he came to our clinic. We have been using Hydrofera Blue the wound is measuring small. He is still a very active man ?working on his own farm there just is not a way for him to offload this ?7/27; type I diabetic with a plantar wound on the tip of his right great toe. Previous left BKA. He is also had an extensive endarterectomy with a right femoral to ?below-knee popliteal bypass before he came here. We have been using Hydrofera Blue. Nice improvements in the wound which is small and superficial now. ?He has an appointment tomorrow with [facility]. I think he made this on his own but we have talked about this last week. He has a hammer ?deformity of the toe. I also wondered if they could be helpful in modifying his foot wear. He is an active man still works and manages his own farm ?8/10-Patient returns at 2 weeks, wearing left prosthesis after BKA, using Hydrofera Blue on the right plantar great toe wound which is measuring bigger today, ?patient does do a fair amount of work outdoors and apparently sold 2 cattle recently and must of been on his feet a whole lot more. For the next 3 weeks he is ?traveling with his wife and therefore will not be outdoors as much on his farm. ?8/17; patient returns to clinic today with a much bigger  wound than what I was used to seeing 3 weeks ago. I have looked through his arterial studies from 7/12. ?At that point he had noncompressible ABIs at 1.72 biphasic waveforms at the PTA and monophasic waveforms at the dorsalis pedis. Unfortunately they did ?not do a TBI on the great toe because it was bandaged. The interpretation however suggested waveforms showed adequate perfusion. The patient states that ?he saw Dr. Myra Gianotti about a month ago, I will see if  I can pull this record. As noted previously the patient is a very active man works with a left below-knee ?prosthesis. The great toe has a bit of a hammer deformity to it. He is trying to offload this using modified insoles in his shoes i.e. cutting out a part of this to ?lift the toe off the bottom of his shoe. ?8/23; culture grew abundant amount of Enterobacter and a few methicillin sensitive staph aureus. I empirically put him on Levaquin in response to this although ?we were not able to get a hold of him by phone to let them know that the antibiotic was at his pharmacy therefore he has not started it yet. I encouraged him to ?start today. The wound is larger, a lot of drainage. There is nothing that probes to bone however we are certainly not making any progress. He is attempting to ?modify his foot wear. 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 I looked at this x-ray indeed it does look ?damaged. He reminds me that he has rods and screws in his left arm from remote trauma. He will not be a candidate for an MRI. Nevertheless I think it is ?important to be sure of the diagnosis here. The patient also has severe PAD. Amputation of parts of the total toe may be necessary if this does not heal. He ?started Levaquin last week. Using silver alginate on the wound ?9/13; we still do not have a CT scan. He is completing 2 weeks of Levaquin and 2 weeks of Flagyl. Most of his complaints of side effects I think are related to ?the Flagyl but that can stop as of when he finishes his medications either later today or tomorrow. ?He tells me that he has been using Neosporin on this for the last week, thinking that the silver alginate causes drainage. I do not know told him I thought this ?would be unlikely. It looks like he is offloading this better ?9/28. Very small improvement in measurements. Surface looks reasonably healthy. He has finished 4 weeks of  Levaquin and 2 weeks of Flagyl. He has a CT ?scan on October 5. Change the dressing to [facility] today ?10/4; dimensions about the same however the wound looks very healthy. ?We are using Hydrofera Blue. Felt offloading. 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 He has 1 more week of Levaquin CT scan tomorrow ?10/11; wound is contracting surface looks very healthy we have been using Hydrofera Blue ?His CT scan showed osteomyelitis of the distal tuft of the great toe this is what I thought it would show fortunately does not show any damage beyond what I ?thought. Also noted that she he had advanced neuropathic change at the Lisfranc joint with osteophytes and heterotrophic calcification. There was no evidence ?of soft tissue emphysema. He is completing his Levaquin I gave him 2 weeks of Flagyl as well I have told him I think we should wait and see now. If we can ?offload  this enough for him to heal. we will watch ?10/25; dimensions of the wound are down dramatically. Epithelialization however the epithelialization still looks somewhat vulnerable. His wife says he has been ?up on the tractor recently which she thinks has offloaded the wound. They are also using a felt donut ?11/8; the wound is fully epithelialized. He completed 6 weeks of Levaquin and 2 weeks of Flagyl. ?Readmission 11/13/2021 ?Mr. [name] is a 71 year old male with a past medical history of type 1 diabetes on an insulin pump, peripheral neuropathy, right femoral below the knee ?popliteal bypass with an ipsilateral saphenous vein on 11/15/2019, and left BKA and smoker that presents to the clinic for a reoccurring wound to his right great ?toe. He has a history of osteomyelitis to the distal tuft of the right great toe. He was treated with 4 weeks of antibiotics including Levaquin and Flagyl. He ?reports that the wound to this area has healed in the past and it has been closed for at least 6 months until it reopened 1 month ago. He has been  using ?Xeroform dressings with benefit. He states he has used [employer]	[place] classic in the past but does not want to use this again. He currently denies signs of ?infection. He states he has insoles that have a cu

## 2021-12-05 NOTE — Progress Notes (Signed)
Tine, Greig L. ([id number]) ?Visit Report for 12/02/2021 ?Arrival Information Details ?Patient Name: Date of Service: ?JOCK, MAHON. 12/02/2021 9:30 A M ?Medical Record Number: [id number] ?Patient Account Number: [id number] ?Date of Birth/Sex: Treating RN: ?October 31, 1950 (71 y.o. Charlean Merl, [name] ?Primary Care Devon Kingdon: [name] Other Clinician: ?Referring Elmore Hyslop: ?Treating Jariana Shumard/Extender: Baltazar Najjar ?[name] ?Weeks in Treatment: 2 ?Visit Information History Since Last Visit ?Added or deleted any medications: No ?Patient Arrived: Ambulatory ?Any new allergies or adverse reactions: No ?Arrival Time: 09:40 ?Had a fall or experienced change in No ?Accompanied By: wife ?activities of daily living that may affect ?Transfer Assistance: None ?risk of falls: ?Patient Identification Verified: Yes ?Signs or symptoms of abuse/neglect since last visito No ?Secondary Verification Process Completed: Yes ?Hospitalized since last visit: No ?Patient Requires Transmission-Based Precautions: No ?Implantable device outside of the clinic excluding No ?Patient Has Alerts: No ?cellular tissue based products placed in the center ?since last visit: ?Has Dressing in Place as Prescribed: Yes ?Pain Present Now: No ?Electronic Signature(s) ?Signed: 12/05/2021 12:54:41 PM By: Fonnie Mu RN ?Entered By: Fonnie Mu on 12/02/2021 09:40:56 ?-------------------------------------------------------------------------------- ?Clinic Level of Care Assessment Details ?Patient Name: Date of Service: ?ROONEY, SWAILS. 12/02/2021 9:30 A M ?Medical Record Number: [id number] ?Patient Account Number: [id number] ?Date of Birth/Sex: Treating RN: ?[birth year] (71 y.o. Charlean Merl, [name] ?Primary Care Brion Hedges: [name] Other Clinician: ?Referring Genetta Fiero: ?Treating Zakkiyya Barno/Extender: Baltazar Najjar ?[name] ?Weeks in Treatment: 2 ?Clinic Level of Care Assessment Items ?TOOL 4 Quantity Score ?X- 1 0 ?Use when only an EandM is  performed on FOLLOW-UP visit ?ASSESSMENTS - Nursing Assessment / Reassessment ?X- 1 10 ?Reassessment of Co-morbidities (includes updates in patient status) ?X- 1 5 ?Reassessment of Adherence to Treatment Plan ?ASSESSMENTS - Wound and Skin A ssessment / Reassessment ?X - Simple Wound Assessment / Reassessment - one wound 1 5 ?[]  - 0 ?Complex Wound Assessment / Reassessment - multiple wounds ?[]  - 0 ?Dermatologic / Skin Assessment (not related to wound area) ?ASSESSMENTS - Focused Assessment ?X- 1 5 ?Circumferential Edema Measurements - multi extremities ?[]  - 0 ?Nutritional Assessment / Counseling / Intervention ?[]  - 0 ?Lower Extremity Assessment (monofilament, tuning fork, pulses) ?[]  - 0 ?Peripheral Arterial Disease Assessment (using hand held doppler) ?ASSESSMENTS - Ostomy and/or Continence Assessment and Care ?[]  - 0 ?Incontinence Assessment and Management ?[]  - 0 ?Ostomy Care Assessment and Management (repouching, etc.) ?PROCESS - Coordination of Care ?X - Simple Patient / Family Education for ongoing care 1 15 ?[]  - 0 ?Complex (extensive) Patient / Family Education for ongoing care ?X- 1 10 ?Staff obtains Consents, Records, T Results / Process Orders ?est ?[]  - 0 ?Staff telephones HHA, Nursing Homes / Clarify orders / etc ?[]  - 0 ?Routine Transfer to another Facility (non-emergent condition) ?[]  - 0 ?Routine Hospital Admission (non-emergent condition) ?[]  - 0 ?New Admissions / / Ordering NPWT Apligraf, etc. ?, ?[]  - 0 ?Emergency Hospital Admission (emergent condition) ?X- 1 10 ?Simple Discharge Coordination ?[]  - 0 ?Complex (extensive) Discharge Coordination ?PROCESS - Special Needs ?[]  - 0 ?Pediatric / Minor Patient Management ?[]  - 0 ?Isolation Patient Management ?[]  - 0 ?Hearing / Language / Visual special needs ?[]  - 0 ?Assessment of Community assistance (transportation, D/C planning, etc.) ?[]  - 0 ?Additional assistance / Altered mentation ?[]  - 0 ?Support Surface(s) Assessment  (bed, cushion, seat, etc.) ?INTERVENTIONS - Wound Cleansing / Measurement ?X - Simple Wound Cleansing - one wound 1 5 ?[]  - 0 ?Complex Wound Cleansing - multiple wounds ?X-  1 5 ?Wound Imaging (photographs - any number of wounds) ?[]  - 0 ?Wound Tracing (instead of photographs) ?X- 1 5 ?Simple Wound Measurement - one wound ?[]  - 0 ?Complex Wound Measurement - multiple wounds ?INTERVENTIONS - Wound Dressings ?[]  - 0 ?Small Wound Dressing one or multiple wounds ?[]  - 0 ?Medium Wound Dressing one or multiple wounds ?[]  - 0 ?Large Wound Dressing one or multiple wounds ?X- 1 5 ?Application of Medications - topical ?[]  - 0 ?Application of Medications - injection ?INTERVENTIONS - Miscellaneous ?[]  - 0 ?External ear exam ?[]  - 0 ?Specimen Collection (cultures, biopsies, blood, body fluids, etc.) ?[]  - 0 ?Specimen(s) / Culture(s) sent or taken to Lab for analysis ?[]  - 0 ?Patient Transfer (multiple staff / / Similar devices) ?[]  - 0 ?Simple Staple / Suture removal (25 or less) ?[]  - 0 ?Complex Staple / Suture removal (26 or more) ?[]  - 0 ?Hypo / Hyperglycemic Management (close monitor of Blood Glucose) ?[]  - 0 ?Ankle / Brachial Index (ABI) - do not check if billed separately ?[]  - 0 ?Vital Signs ?Has the patient been seen at the hospital within the last three years: Yes ?Total Score: 80 ?Level Of Care: New/Established - Level 3 ?Electronic Signature(s) ?Signed: 12/05/2021 12:54:41 PM By: RN ?Entered By: on 12/02/2021 09:58:43 ?-------------------------------------------------------------------------------- ?Encounter Discharge Information Details ?Patient Name: Date of Service: ?ASHLEIGH, ARYA. 12/02/2021 9:30 A M ?Medical Record Number: ?Patient Account Number: ?Date of Birth/Sex: Treating RN: ?06/13/1951 (71 y.o. , [name] ?Primary Care Akeelah Seppala: Other Clinician: ?Referring Bader Stubblefield: ?Treating Whisper Kurka/Extender: ? ?Weeks in Treatment: 2 ?Encounter Discharge Information Items ?Discharge Condition: Stable ?Ambulatory Status: Ambulatory ?Discharge Destination: Home ?Transportation: Private Auto ?Accompanied By: self ?Schedule Follow-up Appointment: Yes ?Clinical Summary of Care: Patient Declined ?Electronic Signature(s) ?Signed: 12/05/2021 12:54:41 PM By: 12/07/2021 RN ?Entered By: Fonnie Mu on 12/02/2021 10:08:14 ?-------------------------------------------------------------------------------- ?Lower Extremity Assessment Details ?Patient Name: Date of Service: ?BOBY, EYER. 12/02/2021 9:30 A M ?Medical Record Number: 02/01/2022 ?Patient Account Number: [id number] ?Date of Birth/Sex: Treating RN: ?1951/03/13 (71 y.o. (62, [name] ?Primary Care Tyanna Hach: Charlean Merl Other Clinician: ?Referring Amelio Brosky: ?Treating Shanese Riemenschneider/Extender: [name] ?Baltazar Najjar ?Weeks in Treatment: 2 ?Edema Assessment ?Assessed: [Left: No] [Right: Yes] ?Edema: [Left: N] [Right: o] ?Calf ?Left: Right: ?Point of Measurement: From Medial Instep 33 cm ?Ankle ?Left: Right: ?Point of Measurement: From Medial Instep 22 cm ?Vascular Assessment ?Pulses: ?Dorsalis Pedis ?Palpable: [Right:Yes] ?Posterior Tibial ?Palpable: [Right:Yes] ?Electronic Signature(s) ?Signed: 12/05/2021 12:54:41 PM By: 12/07/2021 RN ?Entered By: Fonnie Mu on 12/02/2021 09:42:05 ?-------------------------------------------------------------------------------- ?Multi Wound Chart Details ?Patient Name: ?Date of Service: ?ZYIRE, EIDSON. 12/02/2021 9:30 A M ?Medical Record Number: 02/01/2022 ?Patient Account Number: [id number] ?Date of Birth/Sex: ?Treating RN: ?May 23, 1951 (71 y.o. (62, [name] ?Primary Care Zariyah Stephens: Charlean Merl ?Other Clinician: ?Referring Fayette Gasner: ?Treating Jayleana Colberg/Extender: [name] ?Baltazar Najjar ?Weeks in Treatment: 2 ?Vital Signs ?Height(in): 69 ?Pulse(bpm): 56 ?Weight(lbs): 155 ?Blood Pressure(mmHg):  131/75 ?Body Mass Index(BMI): 22.9 ?Temperature(??F): 97.7 ?Respiratory Rate(breaths/min): 17 ?Photos: [2:No Photos Right, Plantar T Great oe] [N/A:N/A N/A] ?Wound Location: [2:Gradually Appeared] [N/A:N/A] ?

## 2021-12-11 ENCOUNTER — Encounter (HOSPITAL_BASED_OUTPATIENT_CLINIC_OR_DEPARTMENT_OTHER): Payer: Medicare Other | Admitting: Internal Medicine

## 2021-12-15 ENCOUNTER — Encounter (HOSPITAL_BASED_OUTPATIENT_CLINIC_OR_DEPARTMENT_OTHER): Payer: Medicare Other | Admitting: Internal Medicine

## 2021-12-15 DIAGNOSIS — E11621 Type 2 diabetes mellitus with foot ulcer: Secondary | ICD-10-CM | POA: Diagnosis not present

## 2021-12-15 DIAGNOSIS — Z89512 Acquired absence of left leg below knee: Secondary | ICD-10-CM | POA: Diagnosis not present

## 2021-12-15 DIAGNOSIS — M86571 Other chronic hematogenous osteomyelitis, right ankle and foot: Secondary | ICD-10-CM | POA: Diagnosis not present

## 2021-12-15 DIAGNOSIS — L97512 Non-pressure chronic ulcer of other part of right foot with fat layer exposed: Secondary | ICD-10-CM | POA: Diagnosis not present

## 2021-12-15 DIAGNOSIS — E10621 Type 1 diabetes mellitus with foot ulcer: Secondary | ICD-10-CM | POA: Diagnosis not present

## 2021-12-15 NOTE — Progress Notes (Signed)
Fiebelkorn, Jermanie L. ([id number]) ?Visit Report for 12/15/2021 ?Arrival Information Details ?Patient Name: Date of Service: ?[name], [name] 12/15/2021 10:15 A M ?Medical Record Number: [id number] ?Patient Account Number: [id number] ?Date of Birth/Sex: Treating RN: ?May 18, 1951 (71 y.o. [name], [name] ?Primary Care Bianna Haran: [name] Other Clinician: ?Referring Nolie Bignell: ?Treating Chalmers Iddings/Extender: Kalman Shan ?[name] ?Weeks in Treatment: 4 ?Visit Information History Since Last Visit ?Added or deleted any medications: No ?Patient Arrived: Ambulatory ?Any new allergies or adverse reactions: No ?Arrival Time: 10:33 ?Had a fall or experienced change in No ?Accompanied By: self ?activities of daily living that may affect ?Transfer Assistance: None ?risk of falls: ?Patient Identification Verified: Yes ?Signs or symptoms of abuse/neglect since last visito No ?Secondary Verification Process Completed: Yes ?Hospitalized since last visit: No ?Patient Requires Transmission-Based Precautions: No ?Implantable device outside of the clinic excluding No ?Patient Has Alerts: No ?cellular tissue based products placed in the center ?since last visit: ?Has Dressing in Place as Prescribed: Yes ?Pain Present Now: No ?Electronic Signature(s) ?Signed: 12/15/2021 12:30:28 PM By: Rhae Hammock RN ?Entered By: Rhae Hammock on 12/15/2021 10:33:50 ?-------------------------------------------------------------------------------- ?Clinic Level of Care Assessment Details ?Patient Name: Date of Service: ?[name], [name] 12/15/2021 10:15 A M ?Medical Record Number: [id number] ?Patient Account Number: [id number] ?Date of Birth/Sex: Treating RN: ?[birth year] (71 y.o. [name], [name] ?Primary Care Sierrah Luevano: [name] Other Clinician: ?Referring Wyndham Santilli: ?Treating Andon Villard/Extender: Kalman Shan ?[name] ?Weeks in Treatment: 4 ?Clinic Level of Care Assessment Items ?TOOL 4 Quantity Score ?X- 1 0 ?Use when only an  EandM is performed on FOLLOW-UP visit ?ASSESSMENTS - Nursing Assessment / Reassessment ?X- 1 10 ?Reassessment of Co-morbidities (includes updates in patient status) ?X- 1 5 ?Reassessment of Adherence to Treatment Plan ?ASSESSMENTS - Wound and Skin A ssessment / Reassessment ?X - Simple Wound Assessment / Reassessment - one wound 1 5 ?[]  - 0 ?Complex Wound Assessment / Reassessment - multiple wounds ?[]  - 0 ?Dermatologic / Skin Assessment (not related to wound area) ?ASSESSMENTS - Focused Assessment ?X- 1 5 ?Circumferential Edema Measurements - multi extremities ?[]  - 0 ?Nutritional Assessment / Counseling / Intervention ?[]  - 0 ?Lower Extremity Assessment (monofilament, tuning fork, pulses) ?[]  - 0 ?Peripheral Arterial Disease Assessment (using hand held doppler) ?ASSESSMENTS - Ostomy and/or Continence Assessment and Care ?[]  - 0 ?Incontinence Assessment and Management ?[]  - 0 ?Ostomy Care Assessment and Management (repouching, etc.) ?PROCESS - Coordination of Care ?X - Simple Patient / Family Education for ongoing care 1 15 ?[]  - 0 ?Complex (extensive) Patient / Family Education for ongoing care ?X- 1 10 ?Staff obtains Consents, Records, T Results / Process Orders ?est ?[]  - 0 ?Staff telephones HHA, Nursing Homes / Clarify orders / etc ?[]  - 0 ?Routine Transfer to another Facility (non-emergent condition) ?[]  - 0 ?Routine Hospital Admission (non-emergent condition) ?[]  - 0 ?New Admissions / Biomedical engineer / Ordering NPWT Apligraf, etc. ?, ?[]  - 0 ?Emergency Hospital Admission (emergent condition) ?X- 1 10 ?Simple Discharge Coordination ?[]  - 0 ?Complex (extensive) Discharge Coordination ?PROCESS - Special Needs ?[]  - 0 ?Pediatric / Minor Patient Management ?[]  - 0 ?Isolation Patient Management ?[]  - 0 ?Hearing / Language / Visual special needs ?[]  - 0 ?Assessment of Community assistance (transportation, D/C planning, etc.) ?[]  - 0 ?Additional assistance / Altered mentation ?[]  - 0 ?Support Surface(s)  Assessment (bed, cushion, seat, etc.) ?INTERVENTIONS - Wound Cleansing / Measurement ?X - Simple Wound Cleansing - one wound 1 5 ?[]  - 0 ?Complex Wound Cleansing - multiple wounds ?X-  1 5 ?Wound Imaging (photographs - any number of wounds) ?[]  - 0 ?Wound Tracing (instead of photographs) ?X- 1 5 ?Simple Wound Measurement - one wound ?[]  - 0 ?Complex Wound Measurement - multiple wounds ?INTERVENTIONS - Wound Dressings ?X - Small Wound Dressing one or multiple wounds 1 10 ?[]  - 0 ?Medium Wound Dressing one or multiple wounds ?[]  - 0 ?Large Wound Dressing one or multiple wounds ?[]  - 0 ?Application of Medications - topical ?[]  - 0 ?Application of Medications - injection ?INTERVENTIONS - Miscellaneous ?[]  - 0 ?External ear exam ?[]  - 0 ?Specimen Collection (cultures, biopsies, blood, body fluids, etc.) ?[]  - 0 ?Specimen(s) / Culture(s) sent or taken to Lab for analysis ?[]  - 0 ?Patient Transfer (multiple staff / Civil Service fast streamer / Similar devices) ?[]  - 0 ?Simple Staple / Suture removal (25 or less) ?[]  - 0 ?Complex Staple / Suture removal (26 or more) ?[]  - 0 ?Hypo / Hyperglycemic Management (close monitor of Blood Glucose) ?[]  - 0 ?Ankle / Brachial Index (ABI) - do not check if billed separately ?X- 1 5 ?Vital Signs ?Has the patient been seen at the hospital within the last three years: Yes ?Total Score: 90 ?Level Of Care: New/Established - Level 3 ?Electronic Signature(s) ?Signed: 12/15/2021 12:30:28 PM By: Rhae Hammock RN ?Entered By: Rhae Hammock on 12/15/2021 11:47:17 ?-------------------------------------------------------------------------------- ?Encounter Discharge Information Details ?Patient Name: Date of Service: ?[name], [name] 12/15/2021 10:15 A M ?Medical Record Number: [id number] ?Patient Account Number: [id number] ?Date of Birth/Sex: Treating RN: ?[birth year] (71 y.o. [name], [name] ?Primary Care Katheleen Stella: [name] Other Clinician: ?Referring Alyn Jurney: ?Treating Mekenzie Modeste/Extender: Kalman Shan ?[name] ?Weeks in Treatment: 4 ?Encounter Discharge Information Items ?Discharge Condition: Stable ?Ambulatory Status: Ambulatory ?Discharge Destination: Home ?Transportation: Private Auto ?Accompanied By: self ?Schedule Follow-up Appointment: Yes ?Clinical Summary of Care: Patient Declined ?Electronic Signature(s) ?Signed: 12/15/2021 12:30:28 PM By: Rhae Hammock RN ?Entered By: Rhae Hammock on 12/15/2021 11:49:02 ?-------------------------------------------------------------------------------- ?Lower Extremity Assessment Details ?Patient Name: Date of Service: ?[name], [name] 12/15/2021 10:15 A M ?Medical Record Number: [id number] ?Patient Account Number: [id number] ?Date of Birth/Sex: Treating RN: ?1951/01/10 (71 y.o. [name], [name] ?Primary Care Shylin Keizer: [name] Other Clinician: ?Referring Squire Withey: ?Treating Waverly Chavarria/Extender: Kalman Shan ?[name] ?Weeks in Treatment: 4 ?Edema Assessment ?Assessed: [Left: No] [Right: Yes] ?Edema: [Left: N] [Right: o] ?Calf ?Left: Right: ?Point of Measurement: From Medial Instep 33 cm ?Ankle ?Left: Right: ?Point of Measurement: From Medial Instep 22 cm ?Vascular Assessment ?Pulses: ?Dorsalis Pedis ?Palpable: [Right:Yes] ?Posterior Tibial ?Palpable: [Right:Yes] ?Electronic Signature(s) ?Signed: 12/15/2021 12:30:28 PM By: Rhae Hammock RN ?Entered By: Rhae Hammock on 12/15/2021 10:34:28 ?-------------------------------------------------------------------------------- ?Multi Wound Chart Details ?Patient Name: ?Date of Service: ?[name], [name]. 12/15/2021 10:15 A M ?Medical Record Number: [id number] ?Patient Account Number: [id number] ?Date of Birth/Sex: ?Treating RN: ?July 26, 1951 (71 y.o. M) ?Primary Care Oneta Sigman: [name] ?Other Clinician: ?Referring Lima Chillemi: ?Treating Keyatta Tolles/Extender: Kalman Shan ?[name] ?Weeks in Treatment: 4 ?Vital Signs ?Height(in): 69 ?Pulse(bpm): 58 ?Weight(lbs): 155 ?Blood Pressure(mmHg):  152/71 ?Body Mass Index(BMI): 22.9 ?Temperature(??F): 98.1 ?Respiratory Rate(breaths/min): 17 ?Photos: [N/A:N/A] ?Right, Plantar T Great ?oe N/A N/A ?Wound Location: ?Gradually Appeared N/A N/A ?Wounding Event: ?Dia

## 2021-12-15 NOTE — Progress Notes (Signed)
Ortwein, Hadden L. ([id number]) ?Visit Report for 12/15/2021 ?Chief Complaint Document Details ?Patient Name: Date of Service: ?[name] [name], [name] [name] 12/15/2021 10:15 A M ?Medical Record Number: [id number] ?Patient Account Number: [id number] ?Date of Birth/Sex: Treating RN: ?Nov 14, 1950 (71 y.o. M) ?Primary Care Provider: Creola [name] Other Clinician: ?Referring Provider: ?Treating Provider/Extender: [name] [name] ?[name] [name] ?Weeks in Treatment: 4 ?Information Obtained from: Patient ?Chief Complaint ?Right great toe wound ?Electronic Signature(s) ?Signed: 12/15/2021 11:45:46 AM By: [name] [name] ?Entered By: [name] [name] on 12/15/2021 11:04:03 ?-------------------------------------------------------------------------------- ?HPI Details ?Patient Name: Date of Service: ?[name] [name], [name] [name] 12/15/2021 10:15 A M ?Medical Record Number: [id number] ?Patient Account Number: [id number] ?Date of Birth/Sex: Treating RN: ?10-Feb-1951 (71 y.o. 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 He is a type I diabetic on an insulin pump diagnosed at age 70. He also ?has known peripheral neuropathy. He has been dealing with a wound on his right first toe since at least February by review of epic. He was seen by Dr. Lajoyce [name]. ?Diagnosed with an ischemic wound sent to see Dr. Myra [name] him who noted a noncompressible ABI 1.67 and a TBI of 0.25 monophasic waveforms. On ?11/15/2019 he underwent a right femoral to below-knee popliteal bypass with an ipsilateral saphenous vein. He also underwent extensive endarterectomy I ?believe that the external iliac. Postoperative ABI was 1.16 he has been using Neosporin on this and gradually making progress according to his companion. He ?is  offloading this with a hole in the inserts in his work shoes. Per his companion who does the dressings the wound has been getting smaller ?Past medical history; type 1 diabetes with PAD and PN, history of a left BKA in 2017, coronary artery disease, hypercholesterolemia, hypertension he is a ?smoker ?6/29; this is a patient I have not seen in 4 weeks. He is a type I diabetic on an insulin pump with a prior left BKA. He was revascularized before he came to our ?clinic with a right femoral to below-knee popliteal bypass with a saphenous vein. He also had an extensive endarterectomy. We used Hydrofera Blue to this ?wound. 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 Wears work boots. ?7/13; type I diabetic with an area on the plantar tip of his right great toe in the setting of a previous left BKA and extensive endarterectomy with a right femoral ?to below-knee popliteal bypass before he came to our clinic. We have been using Hydrofera Blue the wound is measuring small. He is still a very active man ?working on his own farm there just is not a way for him to offload this ?7/27; type I diabetic with a plantar wound on the tip of his right great toe. Previous left BKA. He is also had an extensive endarterectomy with a right femoral to ?below-knee popliteal bypass before he came here. We have been using Hydrofera Blue. Nice improvements in the wound which is small and superficial now. ?He has an appointment tomorrow with [facility]. I think he made this on his own but we have talked about this last week. He has a hammer ?deformity of the toe. I also wondered if they could be helpful in modifying his foot wear. He is an active man still works and manages his own farm ?8/10-Patient returns at 2 weeks,  wearing left prosthesis after BKA, using Hydrofera Blue on the right plantar great toe wound which is measuring bigger today, ?patient does [name] a fair amount of work outdoors and apparently sold 2 cattle recently and must  of been on his feet a whole lot more. For the next 3 weeks he is ?traveling with his wife and therefore will not be outdoors as much on his farm. ?8/17; patient returns to clinic today with a much bigger wound than what I was used to seeing 3 weeks ago. I have looked through his arterial studies from 7/12. ?At that point he had noncompressible ABIs at 1.72 biphasic waveforms at the PTA and monophasic waveforms at the dorsalis pedis. Unfortunately they did ?not [name] a TBI on the great toe because it was bandaged. The interpretation however suggested waveforms showed adequate perfusion. The patient states that ?he saw [name] [name] about a month ago, I will see if I can pull this record. As noted previously the patient is a very active man works with a left below-knee ?prosthesis. The great toe has a bit of a hammer deformity to it. He is trying to offload this using modified insoles in his shoes i.e. cutting out a part of this to ?lift the toe off the bottom of his shoe. ?8/23; culture grew abundant amount of Enterobacter and a few methicillin sensitive staph aureus. I empirically put him on Levaquin in response to this although ?we were not able to get a hold of him by phone to let them know that the antibiotic was at his pharmacy therefore he has not started it yet. I encouraged him to ?start today. The wound is larger, a lot of drainage. There is nothing that probes to bone however we are certainly not making any progress. He is attempting to ?modify his foot wear. 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 I looked at this x-ray indeed it does look ?damaged. He reminds me that he has rods and screws in his left arm from remote trauma. He will not be a candidate for an MRI. Nevertheless I think it is ?important to be sure of the diagnosis here. The patient also has severe PAD. Amputation of parts of the total toe may be necessary if this does not heal.  He ?started Levaquin last week. Using silver alginate on the wound ?9/13; we still [name] not have a CT scan. He is completing 2 weeks of Levaquin and 2 weeks of Flagyl. Most of his complaints of side effects I think are related to ?the Flagyl but that can stop as of when he finishes his medications either later today or tomorrow. ?He tells me that he has been using Neosporin on this for the last week, thinking that the silver alginate causes drainage. I [name] not know told him I thought this ?would be unlikely. It looks like he is offloading this better ?9/28. Very small improvement in measurements. Surface looks reasonably healthy. He has finished 4 weeks of Levaquin and 2 weeks of Flagyl. He has a CT ?scan on October 5. [facility] ?10/4; dimensions about the same however the wound looks very healthy. ?We are using Hydrofera Blue. Felt offloading. 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 He has 1 more week of Levaquin CT scan tomorrow ?10/11; wound is contracting surface looks very healthy we have been using Hydrofera Blue ?His CT scan showed osteomyelitis of the distal tuft  of the great toe this is what I thought it would show fortunately does not show any damage beyond what I ?thought. Also noted that she he had advanced neuropathic change at the Lisfranc joint with osteophytes and heterotrophic calcification. There was no evidence ?of soft tissue emphysema. He is completing his Levaquin I gave him 2 weeks of Flagyl as well I have told him I think we should wait and see now. If we can ?offload this enough for him to heal. we will watch ?10/25; dimensions of the wound are down dramatically. Epithelialization however the epithelialization still looks somewhat vulnerable. His wife says he has been ?up on the tractor recently which she thinks has offloaded the wound. They are also using a felt donut ?11/8; the wound is fully epithelialized. He completed 6 weeks of Levaquin and 2 weeks of  Flagyl. ?Readmission 11/13/2021 ?Mr. Alder Murri is a 71 year old male with a past medical history of type 1 diabetes on an insulin pump, peripheral neuropathy, right femoral below the knee ?popliteal bypass with

## 2022-02-08 IMAGING — CT CT FOOT*R* W/CM
2 series · 13 of 20 positions shown, 16 images · IV contrast (agent unspecified)
Comparison: Great toe radiographs 04/18/2020 and 10/02/2019.

CONTRAST:  100mL OMNIPAQUE IOHEXOL 300 MG/ML  SOLN

CLINICAL DATA: Diabetic ulcer on great toe for 8 months.

EXAM:
CT OF THE LOWER RIGHT EXTREMITY WITH CONTRAST
TECHNIQUE: Multidetector CT imaging of the right foot was performed according
to the standard protocol following intravenous contrast
administration.

[Series 6: axial st · axial · 0.57mm/px · z∈[-1273,-1123]mm · 10 of 120 slices shown, 13 images (1 of 2)]
[im 10/120  soft-tissue]
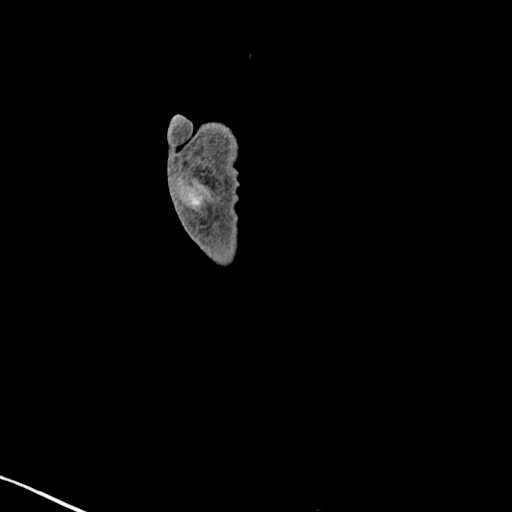
[im 10/120  bone]
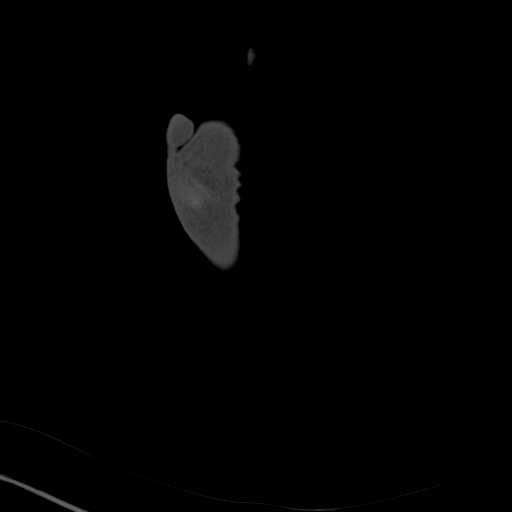
[im 19/120  bone]
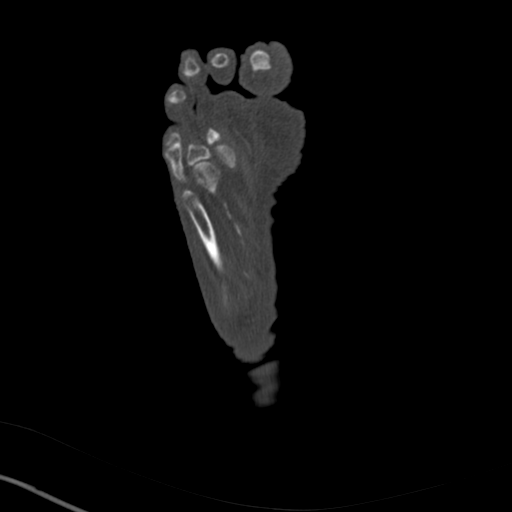
[im 37/120  bone]
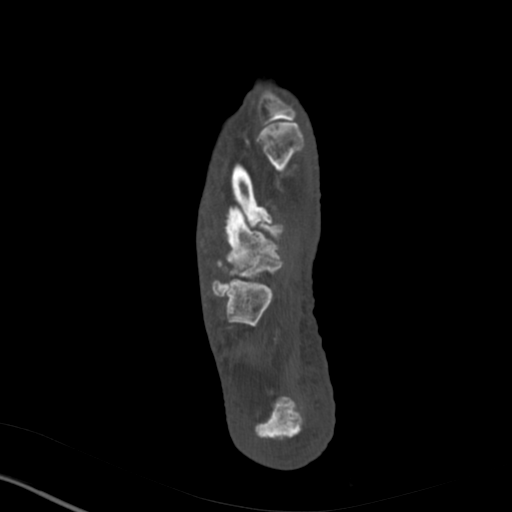
[im 46/120  bone]
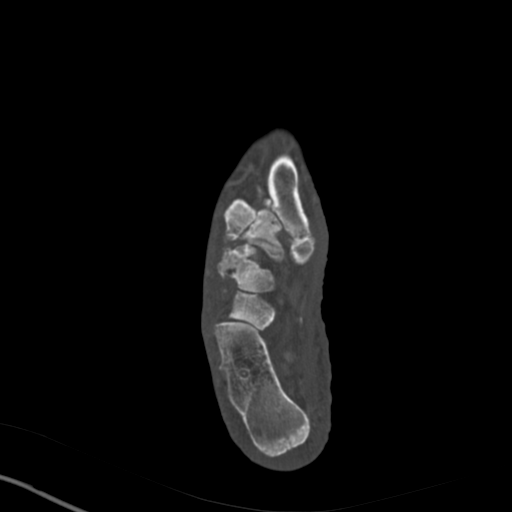
[im 55/120  soft-tissue]
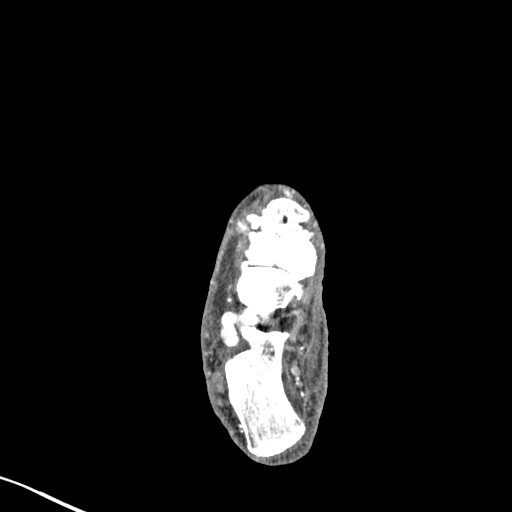
[im 55/120  bone]
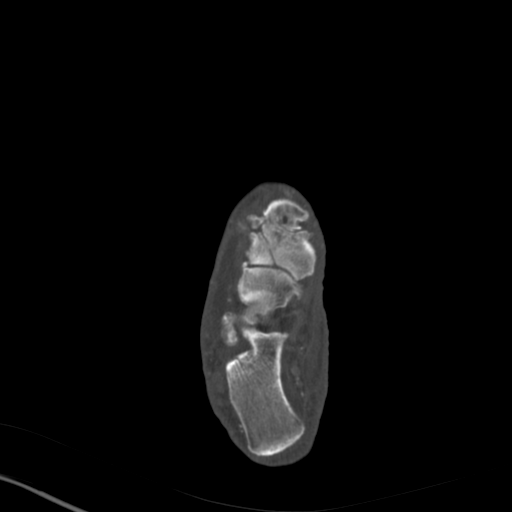
[im 65/120  bone]
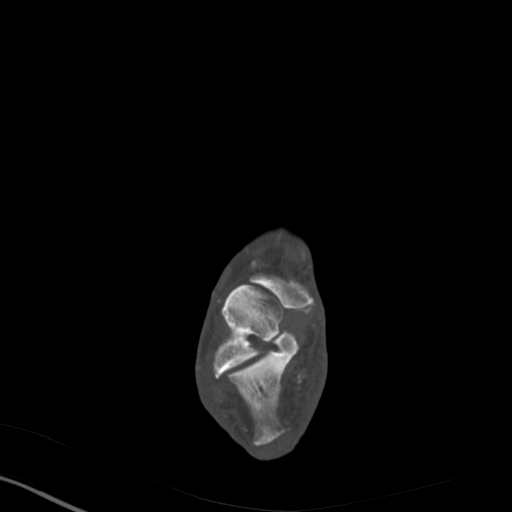
[im 74/120  bone]
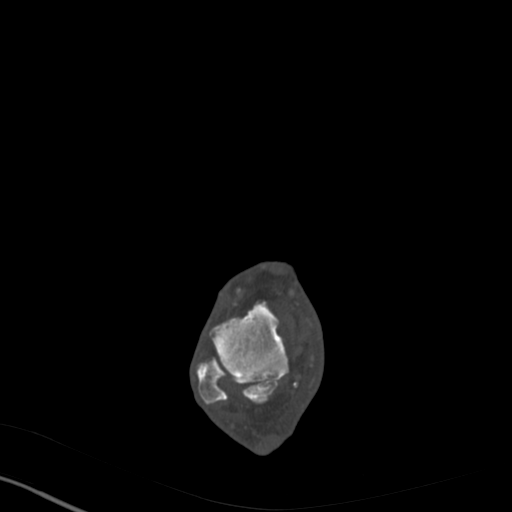
[im 92/120  bone]
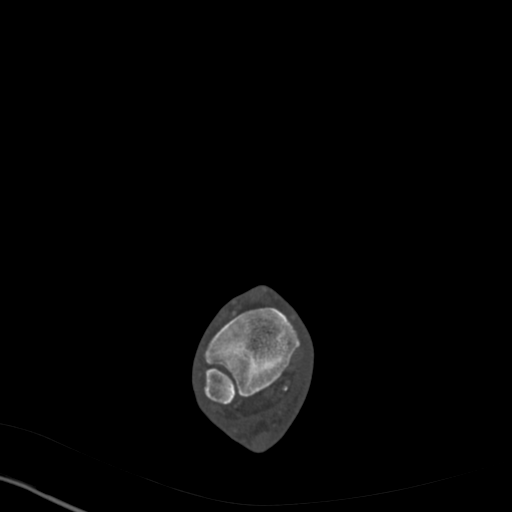
[im 101/120  soft-tissue]
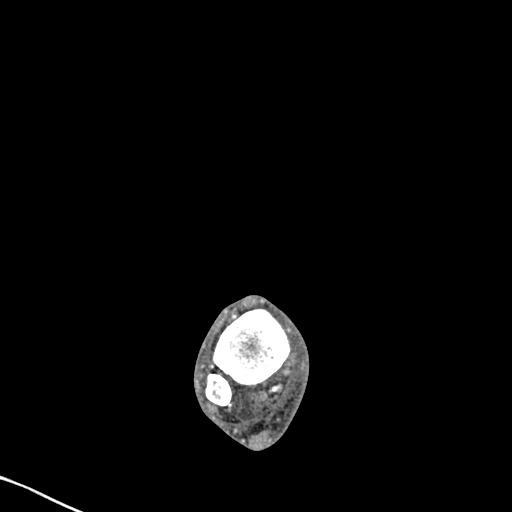
[im 101/120  bone]
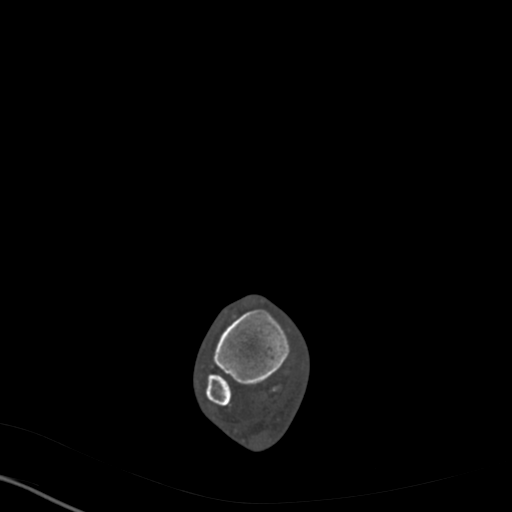
[im 110/120  bone]
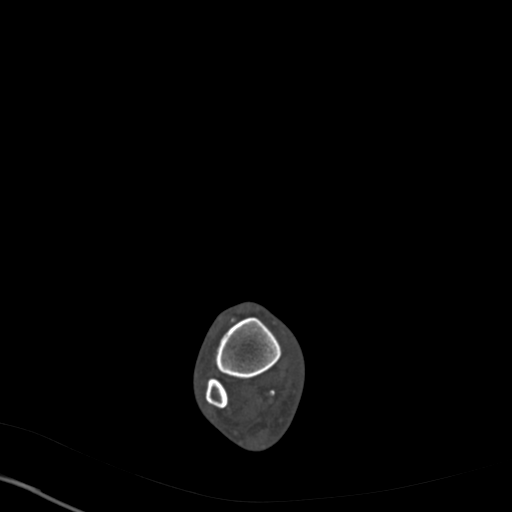

[Series 9: axial st · coronal · 0.18mm/px · 3 of 188 slices shown (2 of 2)]
[im 68/188  bone]
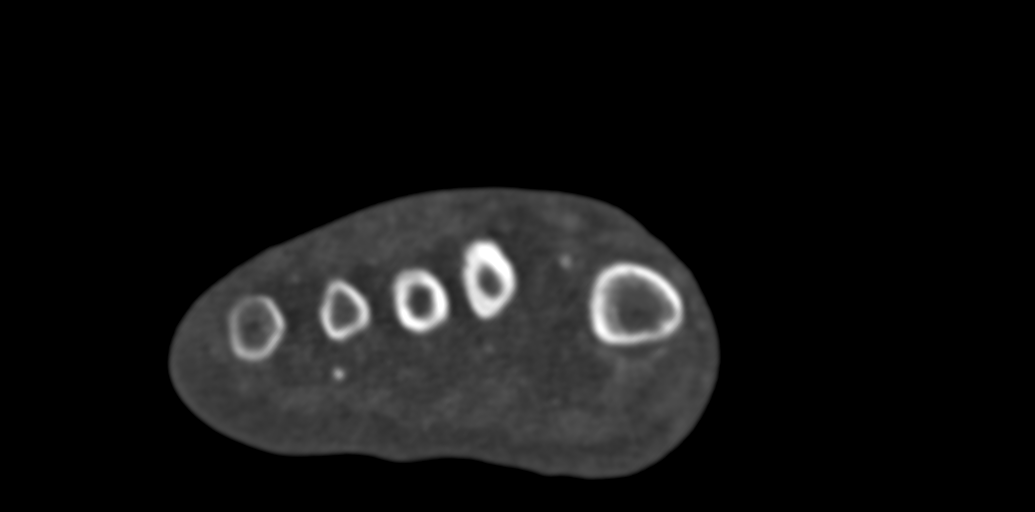
[im 85/188  bone]
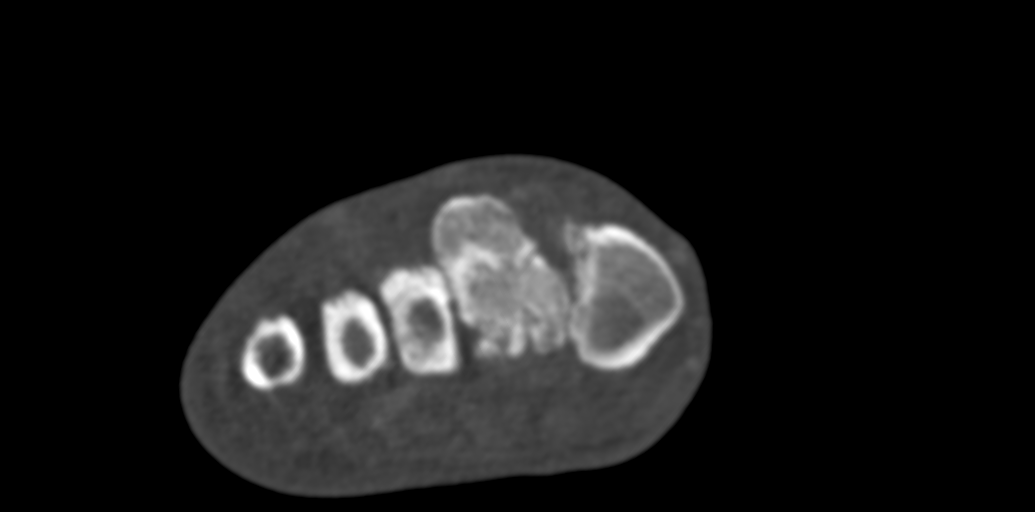
[im 103/188  bone]
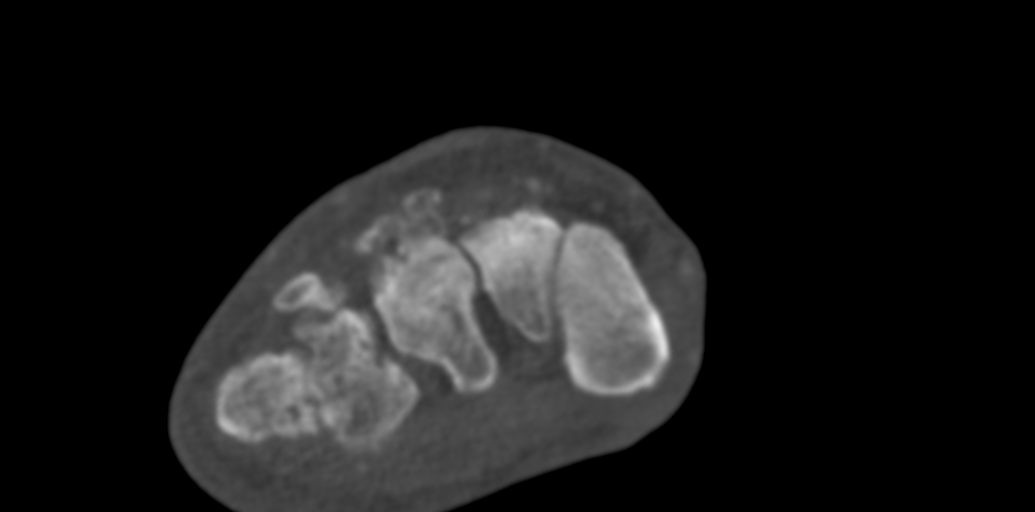

[13 of 20 positions shown; findings below may reference images not displayed]

FINDINGS: Bones/Joint/Cartilage

As demonstrated radiographically, there are erosive changes
involving the distal tuft of the great toe consistent with
osteomyelitis. The proximal phalanx and additional digits appear
intact. Advanced neuropathic changes are present at the Lisfranc
joint with osteophytes and heterotopic ossification. Mild
degenerative changes are present at the ankle. No evidence of acute
fracture or dislocation.

Ligaments

Suboptimally assessed by CT.

Muscles and Tendons

The major ankle tendons appear intact as evaluated by CT.
Generalized forefoot muscular atrophy without focal abnormality.

Soft tissues

Soft tissue ulceration and swelling of the great toe without
evidence of foreign body, focal fluid collection or soft tissue
emphysema. Diffuse vascular calcifications are noted.
IMPRESSION: 1. Osteomyelitis of the distal tuft of the great toe.
2. Advanced neuropathic changes at the Lisfranc joint with
osteophytes and heterotopic ossification.
3. Soft tissue ulceration and swelling of the great toe. No evidence
of foreign body, focal fluid collection or soft tissue emphysema.

## 2022-09-28 ENCOUNTER — Ambulatory Visit: Payer: Medicare Other | Admitting: Podiatry

## 2022-09-29 ENCOUNTER — Encounter: Payer: Self-pay | Admitting: Podiatry

## 2022-09-29 ENCOUNTER — Ambulatory Visit: Payer: Medicare Other | Admitting: Podiatry

## 2022-09-29 DIAGNOSIS — S88112A Complete traumatic amputation at level between knee and ankle, left lower leg, initial encounter: Secondary | ICD-10-CM | POA: Diagnosis not present

## 2022-09-29 DIAGNOSIS — E1159 Type 2 diabetes mellitus with other circulatory complications: Secondary | ICD-10-CM | POA: Diagnosis not present

## 2022-09-29 DIAGNOSIS — B351 Tinea unguium: Secondary | ICD-10-CM

## 2022-09-29 DIAGNOSIS — M79675 Pain in left toe(s): Secondary | ICD-10-CM

## 2022-09-29 DIAGNOSIS — M79674 Pain in right toe(s): Secondary | ICD-10-CM

## 2022-09-29 DIAGNOSIS — Z794 Long term (current) use of insulin: Secondary | ICD-10-CM

## 2022-09-29 DIAGNOSIS — I739 Peripheral vascular disease, unspecified: Secondary | ICD-10-CM

## 2022-09-29 NOTE — Progress Notes (Signed)
  Subjective:  Patient ID: [name], male    DOB: 1950-09-22,   MRN: [id number]  Chief Complaint  Patient presents with   Nail Problem    Routine foot care, nail trim     72 y.o. male presents for concern of thickened elongated and painful nails that are difficult to trim. Requesting to have them trimmed today. Relates burning and tingling in their feet. Patient is diabetic and last A1c was  Lab Results  Component Value Date   HGBA1C 8.0 (H) 11/14/2019   .   PCP:  [name], MD    . Denies any other pedal complaints. Denies n/v/f/c.   Past Medical History:  Diagnosis Date   Cigarette smoker    Coronary artery disease    Diabetes mellitus    type 1  x 50 yrs   Diabetic retinopathy ([place])    Dyslipidemia    Hypertension    Peripheral vascular disease (HCC)     Objective:  Physical Exam: Vascular: DP/PT pulses 2/4 bilateral. CFT <3 seconds. Absent hair growth on digits. Edema noted to bilateral lower extremities. Xerosis noted bilaterally.  Skin. No lacerations or abrasions bilateral feet. Nails 1-5 right  are thickened discolored and elongated with subungual debris.  Musculoskeletal: MMT 5/5 bilateral lower extremities in DF, PF, Inversion and Eversion. Deceased ROM in DF of ankle joint. BKA on left Neurological: Sensation intact to light touch. Protective sensation diminished bilateral.    Assessment:   1. Controlled type 2 diabetes mellitus with other circulatory complication, with long-term current use of insulin (HCC)   2. Claudication in peripheral vascular disease (HCC)   3. Below-knee amputation of left lower extremity (HCC)   4. Pain due to onychomycosis of toenails of both feet      Plan:  Patient was evaluated and treated and all questions answered. -Discussed and educated patient on diabetic foot care, especially with  regards to the vascular, neurological and musculoskeletal systems.  -Stressed the importance of good glycemic control and the  detriment of not  controlling glucose levels in relation to the foot. -Discussed supportive shoes at all times and checking feet regularly.  -Mechanically debrided all nails 1-5 right using sterile nail nipper and filed with dremel without incident  -Answered all patient questions -Patient to return  in 3 months for at risk foot care -Patient advised to call the office if any problems or questions arise in the meantime.   Lorenda Peck, DPM

## 2022-10-19 ENCOUNTER — Encounter: Payer: Self-pay | Admitting: Cardiology

## 2022-10-19 ENCOUNTER — Ambulatory Visit: Payer: Medicare Other | Admitting: Cardiology

## 2022-10-19 VITALS — BP 130/57 | HR 60 | Resp 16 | Ht 71.0 in | Wt 144.0 lb

## 2022-10-19 DIAGNOSIS — E7800 Pure hypercholesterolemia, unspecified: Secondary | ICD-10-CM

## 2022-10-19 DIAGNOSIS — S88112A Complete traumatic amputation at level between knee and ankle, left lower leg, initial encounter: Secondary | ICD-10-CM

## 2022-10-19 DIAGNOSIS — I2510 Atherosclerotic heart disease of native coronary artery without angina pectoris: Secondary | ICD-10-CM

## 2022-10-19 DIAGNOSIS — I739 Peripheral vascular disease, unspecified: Secondary | ICD-10-CM

## 2022-10-19 NOTE — Progress Notes (Signed)
Primary Physician/Referring:  [name], MD  Patient ID: [name], male    DOB: [birth year], 72 y.o.   MRN: [id number]  Chief Complaint  Patient presents with   Hypertension   Coronary Artery Disease   PAD   HPI:    [name]  is a 72 y.o. Caucasian male current smoker with history of type 1 diabetes mellitus with insulin pump, hyperlipidemia, coronary artery disease and had non-ST elevation myocardial infarction,  moderate diffuse scattered coronary artery disease. Last stress test in 2015 revealed no ischemia and normal LV systolic function.   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 Unfortunately he continues to smoke a half a pack of cigarettes per day.    Past Medical History:  Diagnosis Date   Cigarette smoker    Coronary artery disease    Diabetes mellitus    type 1  x 50 yrs   Diabetic retinopathy ([place])    Dyslipidemia    Hypertension    Peripheral vascular disease ([place])    Past Surgical History:  Procedure Laterality Date   ABDOMINAL ANGIOGRAM N/A 05/29/2014   Procedure: ABDOMINAL ANGIOGRAM;  Surgeon: Laverda Page, MD;  Location: [facility] CATH LAB;  Service: Cardiovascular;  Laterality: N/A;   ABDOMINAL AORTOGRAM W/LOWER EXTREMITY N/A 11/14/2019   Procedure: ABDOMINAL AORTOGRAM W/LOWER EXTREMITY;  Surgeon: Serafina Mitchell, MD;  Location: [place] [facility] LAB;  Service: Cardiovascular;  Laterality: N/A;   AMPUTATION Left 10/25/2015   Procedure: Left Foot 5th Ray Amputation;  Surgeon: Newt Minion, MD;  Location: [place];  Service: Orthopedics;  Laterality: Left;   AMPUTATION Left 12/06/2015   Procedure: AMPUTATION BELOW KNEE;  Surgeon: Newt Minion, MD;  Location: [place];  Service: Orthopedics;  Laterality: Left;   CARDIAC  CATHETERIZATION     EF is 55-60% and no wall motion abnormalities (long long time ago)   FEMORAL-POPLITEAL BYPASS GRAFT Left 10/24/2015   Procedure: BYPASS GRAFT FEMORAL below knee POPLITEAL ARTERY with Left Saphenous Vein;  Surgeon: Serafina Mitchell, MD;  Location: [place];  Service: Vascular;  Laterality: Left;   FEMORAL-POPLITEAL BYPASS GRAFT Right 11/15/2019   Procedure: BYPASS GRAFT FEMORAL-POPLITEAL ARTERY using Right Leg Greater Saphenous Vein;  Surgeon: Serafina Mitchell, MD;  Location: [place];  Service: Vascular;  Laterality: Right;   FRACTURE SURGERY     left arm "many yrs ago"   LOWER EXTREMITY ANGIOGRAM N/A 01/23/2014   Procedure: LOWER EXTREMITY ANGIOGRAM;  Surgeon: Laverda Page, MD;  Location: [facility] CATH LAB;  Service: Cardiovascular;  Laterality: N/A;   LOWER EXTREMITY ANGIOGRAM N/A 07/31/2014   Procedure: LOWER EXTREMITY ANGIOGRAM;  Surgeon: Laverda Page, MD;  Location: [facility] CATH LAB;  Service: Cardiovascular;  Laterality: N/A;   LOWER EXTREMITY ANGIOGRAM Left 10/11/2015   Procedure: Lower Extremity Angiogram;  Surgeon: Serafina Mitchell, MD;  Location: [place] [facility] LAB;  Service: Cardiovascular;  Laterality: Left;   PERIPHERAL VASCULAR BALLOON ANGIOPLASTY  11/14/2019   Procedure: PERIPHERAL VASCULAR BALLOON ANGIOPLASTY;  Surgeon: Serafina Mitchell, MD;  Location: [place] CV LAB;  Service: Cardiovascular;;   PERIPHERAL VASCULAR CATHETERIZATION Left 10/11/2015   Procedure: Peripheral Vascular Balloon Angioplasty;  Surgeon: Serafina Mitchell, MD;  Location: [place] [facility] LAB;  Service: Cardiovascular;  Laterality: Left;  sfa failed unable to cross occluded sfa   PERIPHERAL VASCULAR CATHETERIZATION N/A 10/11/2015   Procedure: Abdominal Aortogram;  Surgeon: Serafina Mitchell, MD;  Location: [place] CV LAB;  Service: Cardiovascular;  Laterality: N/A;   STUMP REVISION Left 03/02/2016   Procedure: Revision Left Below Knee Amputation;  Surgeon: Newt Minion, MD;  Location: [place];  Service:  Orthopedics;  Laterality: Left;     Social History   Tobacco Use   Smoking status: Every Day    Packs/day: 0.50    Years: 30.00    Total pack years: 15.00    Types: Cigarettes   Smokeless tobacco: Never  Substance Use Topics   Alcohol use: Yes    Comment: on occasion wine   Marital Status: Widowed   ROS  Review of Systems  Cardiovascular:  Positive for dyspnea on exertion. Negative for chest pain and leg swelling.   Objective  Blood pressure (!) 130/57, pulse 60, resp. rate 16, height '5\' 11"'$  (1.803 m), weight 144 lb (65.3 kg).     10/19/2022   11:39 AM 06/18/2020    3:57 PM 03/04/2020   12:58 PM  Vitals with BMI  Height '5\' 11"'$  '5\' 11"'$  '5\' 11"'$   Weight 144 lbs 162 lbs 152 lbs 2 oz  BMI 20.09 [id number] 0000000  Systolic [id number]  Diastolic 57 64 69  Pulse 60 68 59      Physical Exam Vitals reviewed.  Constitutional:      Comments: Appears older than stated age  HENT:     Head: Normocephalic and atraumatic.  Neck:     Vascular: No carotid bruit or JVD.  Cardiovascular:     Rate and Rhythm: Normal rate and regular rhythm.     Pulses:          Femoral pulses are 2+ on the right side and 2+ on the left side.      Popliteal pulses are 0 on the right side. Left popliteal pulse not accessible.       Dorsalis pedis pulses are 0 on the right side. Left dorsalis pedis pulse not accessible.       Posterior tibial pulses are 0 on the right side. Left posterior tibial pulse not accessible.     Heart sounds: S1 normal and S2 normal. No murmur heard.    No gallop.     Comments: Capillary refill normal right leg. Left BKA.  Pulmonary:     Effort: Pulmonary effort is normal.     Breath sounds: Normal breath sounds.  Musculoskeletal:     Right lower leg: No edema.  Skin:    Comments: Unable to evaluate right great toe ulcer as dressings are intact.   Neurological:     Mental Status: He is alert.     Laboratory examination:   External labs:   Labs 07/28/2022:  A1c 8.5%.   TSH normal at 1.06.  Serum glucose: 48 mg, BUN 16, creatinine 0.8, EGFR 95 mill, potassium 5.8, LFTs normal.  Hb 13.4/HCT 38.6, platelets 351, normal indicis.  Total cholesterol 142, triglycerides 41, HDL 53, LDL 61.  Non-HDL cholesterol 69.  Medications and allergies   Allergies  Allergen Reactions   Simvastatin Other (See Comments)    MYALGIAS, MUSCLE WEAKNESS      Current Outpatient Medications:    acetaminophen (TYLENOL) 500 MG tablet, Take 1,000 mg by mouth 2 (two) times daily as needed (pain). , Disp: , Rfl:    aspirin EC 81 MG tablet, Take  243 mg by mouth daily. , Disp: , Rfl:    baclofen (LIORESAL) 20 MG tablet, Take 20 mg by mouth daily as needed (for leg cramps). Reported on 12/05/2015, Disp: , Rfl: 0   calcium carbonate (TUMS - DOSED IN MG ELEMENTAL CALCIUM) 500 MG chewable tablet, Chew 2 tablets by mouth every 6 (six) hours as needed for indigestion or heartburn., Disp: , Rfl:    folic acid (FOLVITE) Q000111Q MCG tablet, Take 1,600 mcg by mouth daily. , Disp: , Rfl:    HUMALOG 100 UNIT/ML injection, PUMP, Disp: , Rfl:    metoprolol succinate (TOPROL-XL) 25 MG 24 hr tablet, Take 1 tablet (25 mg total) by mouth daily., Disp: 30 tablet, Rfl: 1   Multiple Vitamins-Minerals (MULTIVITAMIN GUMMIES ADULT PO), Take 2 tablets by mouth daily. , Disp: , Rfl:    ONETOUCH ULTRA test strip, , Disp: , Rfl:    ramipril (ALTACE) 10 MG tablet, Take 10 mg by mouth daily.  , Disp: , Rfl:    rosuvastatin (CRESTOR) 10 MG tablet, TAKE 1 TABLET BY MOUTH  DAILY, Disp: 90 tablet, Rfl: 3    Radiology:   No results found.  Cardiac Studies:   Coronary Angiogram 11/18/2004: Mild diffuse luminal irregularity, 40% stenosis in the proximal RCA, 30-40% in the mid and 50-60% in the distal. EF 55-60%.   Lexiscan sestamibi stress test 10/23/2013: 1. Resting EKG NSR, early repolarization, stress EKG was non-diagnostic for ischemia. No ST-T changes of ischemia noted with pharmacologic stress testing. Stress  symptoms included lightheadedness. Stress terminated due to completion of protocol. 2. The perfusion study demonstrated normal isotope uptake both at rest and stress. There was no evidence of ischemia or scar. Dynamic gated images reveal normal wall motion and endocardial thickening. Left ventricular ejection fraction was estimated to be 63%.    Abdominal aortogram with lower extremity/peripheral vascular balloon angioplasty 11/14/2019: Impression: Occluded right superficial femoral artery unsuccessful recanalization Patient will be scheduled for a right femoral below-knee popliteal artery bypass graft tomorrow for limb salvage.  Vascular ultrasound ABI 03/04/2020: +---------+------------------+-----+----------+--------+  Right    Rt Pressure (mmHg)IndexWaveform  Comment   +---------+------------------+-----+----------+--------+  Brachial 148                                        +---------+------------------+-----+----------+--------+  PTA      252               1.70 biphasic            +---------+------------------+-----+----------+--------+  DP       >254              1.72 monophasic          +---------+------------------+-----+----------+--------+  Great Toe                                 Bandage   +---------+------------------+-----+----------+--------+   +--------+------------------+-----+--------+-------+  Left    Lt Pressure (mmHg)IndexWaveformComment  +--------+------------------+-----+--------+-------+  [id number]                                     +--------+------------------+-----+--------+-------+ Summary:  Right: Resting right ankle-brachial index indicates noncompressible right lower extremity arteries. Waveforms suggest adequate perfusion.   Vascular ultrasound lower extremity arterial duplex right 03/04/2020:  Patent femoral  to popliteal bypass graft with no evidence for restenosis.   Carotid artery duplex  07/24/2020: There is a large fixed calcific plaque noted in the bilateral mid common carotid artery. Otherwise mild diffuse heterogeneous plaque noted bilateral ICA. No significant stenosis. Antegrade right vertebral artery flow. Antegrade left vertebral artery flow. Follow up studies if clinically indicated.   EKG:   EKG 10/19/2022: Sinus bradycardia at rate of 57 beats minute, left atrial enlargement, otherwise normal EKG.  Compared to 06/18/2020, no significant change.   Assessment     ICD-10-CM   1. Coronary artery disease involving native coronary artery of native heart without angina pectoris  I25.10 EKG 12-Lead    2. Pure hypercholesterolemia  E78.00     3. Claudication in peripheral vascular disease (HCC)  I73.9     4. Below-knee amputation of left lower extremity ([place])  [id number]        Medications Discontinued During This Encounter  Medication Reason   amLODipine (NORVASC) 10 MG tablet    NOVOLOG 100 UNIT/ML injection     No orders of the defined types were placed in this encounter.   Recommendations:   [name] is a 72 y.o. Caucasian male current smoker with history of type 1 diabetes mellitus with insulin pump, hyperlipidemia, coronary artery disease and had non-ST elevation myocardial infarction,  moderate diffuse scattered coronary artery disease. Last stress test in 2015 revealed no ischemia and normal LV systolic function.   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   1. 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  In spite of extensive tobacco use history, fortunately he has done well.  2. Pure  hypercholesterolemia Lipids are under good control, he would be a good candidate for "Prevail-ASCVD" study using Cholestryl Esther transfer protein inhibitor Obecertrapib 10 mg daily in patients with ASCVD on maximal lipid-lowering therapy, LDL >80 mg and CV risk reduction.   3. Claudication in peripheral vascular disease ([place]) He has had bypass surgery to his right lower extremity, capillary refill is normal, good femoral pulse.  No tissue loss.  Again discussed with him regarding low threshold to call us if he develops any bluish discoloration in his toes or any wound.  With regard to feet care, he does see podiatrist for maintenance of his toenails.  He is aware to be careful with regard to toenail clipping.  4. Below-knee amputation of left lower extremity ([place]) Patient is SP left BKA.  He walks with help of a prosthesis without complications.  As it has been greater than 2 years since the last time, would like to see him back in 6 months to make sure that he is doing well and stable.  I reviewed his external labs and updated them.    Adrian Prows, MD, [facility] 10/19/2022, 12:23 PM Office: [phone] Fax: [phone] Pager: [phone]

## 2022-11-27 ENCOUNTER — Emergency Department (HOSPITAL_COMMUNITY)
Admission: EM | Admit: 2022-11-27 | Discharge: 2022-11-27 | Disposition: A | Discharge status: Home / Self Care | Payer: Medicare Other | Attending: Emergency Medicine | Admitting: Emergency Medicine

## 2022-11-27 ENCOUNTER — Other Ambulatory Visit: Payer: Self-pay

## 2022-11-27 ENCOUNTER — Emergency Department (HOSPITAL_COMMUNITY): Payer: Medicare Other

## 2022-11-27 ENCOUNTER — Encounter (HOSPITAL_COMMUNITY): Payer: Self-pay

## 2022-11-27 DIAGNOSIS — Z794 Long term (current) use of insulin: Secondary | ICD-10-CM | POA: Insufficient documentation

## 2022-11-27 DIAGNOSIS — R0600 Dyspnea, unspecified: Secondary | ICD-10-CM | POA: Diagnosis present

## 2022-11-27 DIAGNOSIS — I2510 Atherosclerotic heart disease of native coronary artery without angina pectoris: Secondary | ICD-10-CM | POA: Insufficient documentation

## 2022-11-27 DIAGNOSIS — I10 Essential (primary) hypertension: Secondary | ICD-10-CM | POA: Diagnosis not present

## 2022-11-27 DIAGNOSIS — J441 Chronic obstructive pulmonary disease with (acute) exacerbation: Secondary | ICD-10-CM | POA: Diagnosis not present

## 2022-11-27 DIAGNOSIS — E11319 Type 2 diabetes mellitus with unspecified diabetic retinopathy without macular edema: Secondary | ICD-10-CM | POA: Diagnosis not present

## 2022-11-27 DIAGNOSIS — E278 Other specified disorders of adrenal gland: Secondary | ICD-10-CM

## 2022-11-27 DIAGNOSIS — Z20822 Contact with and (suspected) exposure to covid-19: Secondary | ICD-10-CM | POA: Diagnosis not present

## 2022-11-27 DIAGNOSIS — Z7951 Long term (current) use of inhaled steroids: Secondary | ICD-10-CM | POA: Insufficient documentation

## 2022-11-27 DIAGNOSIS — J45909 Unspecified asthma, uncomplicated: Secondary | ICD-10-CM | POA: Diagnosis not present

## 2022-11-27 DIAGNOSIS — F17210 Nicotine dependence, cigarettes, uncomplicated: Secondary | ICD-10-CM | POA: Insufficient documentation

## 2022-11-27 DIAGNOSIS — Z79899 Other long term (current) drug therapy: Secondary | ICD-10-CM | POA: Diagnosis not present

## 2022-11-27 DIAGNOSIS — R7989 Other specified abnormal findings of blood chemistry: Secondary | ICD-10-CM

## 2022-11-27 DIAGNOSIS — F17200 Nicotine dependence, unspecified, uncomplicated: Secondary | ICD-10-CM | POA: Diagnosis not present

## 2022-11-27 LAB — COMPREHENSIVE METABOLIC PANEL
ALT: 20 U/L (ref 0–44)
AST: 22 U/L (ref 15–41)
Albumin: 3.7 g/dL (ref 3.5–5.0)
Alkaline Phosphatase: 71 U/L (ref 38–126)
Anion gap: 6 (ref 5–15)
BUN: 20 mg/dL (ref 8–23)
CO2: 28 mmol/L (ref 22–32)
Calcium: 8.8 mg/dL — ABNORMAL LOW (ref 8.9–10.3)
Chloride: 101 mmol/L (ref 98–111)
Creatinine, Ser: 0.81 mg/dL (ref 0.61–1.24)
GFR, Estimated: >60 mL/min (ref >60)
Glucose, Bld: 228 mg/dL — ABNORMAL HIGH (ref 70–99)
Potassium: 4.4 mmol/L (ref 3.5–5.1)
Sodium: 135 mmol/L (ref 135–145)
Total Bilirubin: 0.6 mg/dL (ref 0.3–1.2)
Total Protein: 6.5 g/dL (ref 6.5–8.1)

## 2022-11-27 LAB — ECHOCARDIOGRAM COMPLETE
Area-P 1/2: 2.61 cm2
Calc EF: 73.8 %
Est EF: >75
Height: 71 in
S' Lateral: 3.2 cm
Single Plane A2C EF: 69.9 %
Single Plane A4C EF: 79 %
Weight: 2400 oz

## 2022-11-27 LAB — CBC WITH DIFFERENTIAL/PLATELET
Abs Immature Granulocytes: 0.02 10*3/uL (ref 0.00–0.07)
Basophils Absolute: 0 10*3/uL (ref 0.0–0.1)
Basophils Relative: 1 %
Eosinophils Absolute: 0.2 10*3/uL (ref 0.0–0.5)
Eosinophils Relative: 3 %
HCT: 38.2 % — ABNORMAL LOW (ref 39.0–52.0)
Hemoglobin: 13 g/dL (ref 13.0–17.0)
Immature Granulocytes: 0 %
Lymphocytes Relative: 36 %
Lymphs Abs: 1.9 10*3/uL (ref 0.7–4.0)
MCH: 32.2 pg (ref 26.0–34.0)
MCHC: 34 g/dL (ref 30.0–36.0)
MCV: 94.6 fL (ref 80.0–100.0)
Monocytes Absolute: 0.6 10*3/uL (ref 0.1–1.0)
Monocytes Relative: 12 %
Neutro Abs: 2.7 10*3/uL (ref 1.7–7.7)
Neutrophils Relative %: 48 %
Platelets: 328 10*3/uL (ref 150–400)
RBC: 4.04 MIL/uL — ABNORMAL LOW (ref 4.22–5.81)
RDW: 13.2 % (ref 11.5–15.5)
WBC: 5.4 10*3/uL (ref 4.0–10.5)
nRBC: 0 % (ref 0.0–0.2)

## 2022-11-27 LAB — TROPONIN I (HIGH SENSITIVITY)
Troponin I (High Sensitivity): 19 ng/L — ABNORMAL HIGH (ref <18)
Troponin I (High Sensitivity): 119 ng/L (ref <18)

## 2022-11-27 LAB — BRAIN NATRIURETIC PEPTIDE: B Natriuretic Peptide: 36.7 pg/mL (ref 0.0–100.0)

## 2022-11-27 LAB — BLOOD GAS, VENOUS
Acid-Base Excess: 2.6 mmol/L — ABNORMAL HIGH (ref 0.0–2.0)
Bicarbonate: 29.7 mmol/L — ABNORMAL HIGH (ref 20.0–28.0)
O2 Saturation: 37.1 %
Patient temperature: 37
pCO2, Ven: 55 mmHg (ref 44–60)
pH, Ven: 7.34 (ref 7.25–7.43)
pO2, Ven: <31 mmHg — CL (ref 32–45)

## 2022-11-27 LAB — SARS CORONAVIRUS 2 BY RT PCR: SARS Coronavirus 2 by RT PCR: NEGATIVE

## 2022-11-27 LAB — D-DIMER, QUANTITATIVE: D-Dimer, Quant: 0.78 ug/mL-FEU — ABNORMAL HIGH (ref 0.00–0.50)

## 2022-11-27 MED ORDER — PREDNISONE 50 MG PO TABS: 50.0000 mg | ORAL_TABLET | Freq: Every day | ORAL | 0 refills | Status: AC

## 2022-11-27 MED ORDER — MAGNESIUM SULFATE 2 GM/50ML IV SOLN
2.0000 g | Freq: Once | INTRAVENOUS | Status: AC
  Administered 2022-11-27: 2 g via INTRAVENOUS
  Filled 2022-11-27: qty 50

## 2022-11-27 MED ORDER — ASPIRIN 81 MG PO CHEW
324.0000 mg | CHEWABLE_TABLET | Freq: Once | ORAL | Status: AC
  Administered 2022-11-27: 324 mg via ORAL
  Filled 2022-11-27: qty 4

## 2022-11-27 MED ORDER — SODIUM CHLORIDE (PF) 0.9 % IJ SOLN
INTRAMUSCULAR | Status: AC
  Filled 2022-11-27: qty 50

## 2022-11-27 MED ORDER — ALBUTEROL SULFATE HFA 108 (90 BASE) MCG/ACT IN AERS: 1.0000 | INHALATION_SPRAY | Freq: Four times a day (QID) | RESPIRATORY_TRACT | 1 refills | Status: DC | PRN

## 2022-11-27 MED ORDER — ALBUTEROL SULFATE HFA 108 (90 BASE) MCG/ACT IN AERS
2.0000 | INHALATION_SPRAY | Freq: Once | RESPIRATORY_TRACT | Status: AC
  Administered 2022-11-27: 2 via RESPIRATORY_TRACT
  Filled 2022-11-27: qty 6.7

## 2022-11-27 MED ORDER — METHYLPREDNISOLONE SODIUM SUCC 125 MG IJ SOLR
62.5000 mg | Freq: Once | INTRAMUSCULAR | Status: AC
  Administered 2022-11-27: 62.5 mg via INTRAVENOUS
  Filled 2022-11-27: qty 2

## 2022-11-27 MED ORDER — IOHEXOL 350 MG/ML SOLN
75.0000 mL | Freq: Once | INTRAVENOUS | Status: AC | PRN
  Administered 2022-11-27: 75 mL via INTRAVENOUS

## 2022-11-27 NOTE — ED Notes (Signed)
CRITICAL VALUE STICKER  CRITICAL VALUE: Troponin 119  RECEIVER (on-site recipient of call):  DATE & TIME NOTIFIED:   MESSENGER (representative from lab):  MD NOTIFIED: [place]  TIME OF NOTIFICATION: 0820  RESPONSE: Orders received.

## 2022-11-27 NOTE — ED Provider Notes (Signed)
Care transferred to me.  Patient seems to be feeling better upon talking to him.  He never had any chest pain though he did have shortness of breath and wheezing.  However his troponin has gone from 19-119.  Repeat EKG shows no acute ischemia.  He was given a dose of aspirin.  9:20 AM I discussed with cardiology, Dr. Leonides Cave. Likely this is supply and demand. He advises that given his risk factors we should get a stat echo, one more troponin. If these are ok, can likely be discharged for outpatient stress test.  11:06 AM Patient continues to have no chest pain.  Troponin has gone slightly up from 119-139.  The echo shows hyperdynamic heart but no other concerning findings.  Cardiology is recommending a D-dimer for completeness but otherwise can follow-up closely as an outpatient.  Discussed this with the patient, will do D-dimer and CT if positive.  2:26 PM CT scan shows no obvious PE or pneumonia.  I did make him aware of the adrenal nodule and he will need follow-up.  Otherwise, albuterol and prednisone was already sent into his pharmacy by Dr. Wallace Cullens.  Discussed return precautions.   EKG Interpretation  Date/Time:  Friday November 27 2022 08:31:03 EDT Ventricular Rate:  67 PR Interval:  148 QRS Duration: 85 QT Interval:  403 QTC Calculation: 426 R Axis:   69 Text Interpretation: Sinus rhythm no acute ST/T changes similar to earlier in the day Confirmed by Pricilla Loveless [phone]) on 11/27/2022 9:17:59 AM          Pricilla Loveless, MD 11/27/22 1430

## 2022-11-27 NOTE — ED Triage Notes (Signed)
Patient brought in by [facility], reports he woke up about an hour ago with SOB, EMS reports audible wheezing upon arrival and patient was cyanotic. Received 2 breathing treatments pta. Denies any hx of COPD or asthma.

## 2022-11-27 NOTE — ED Provider Notes (Signed)
[place] EMERGENCY DEPARTMENT AT [facility] Provider Note  CSN: [id number] Arrival date & time: 11/27/22 0458  Chief Complaint(s) Shortness of Breath  HPI [name] is a 72 y.o. male with past medical history as below, significant for DM, HLD, CAD, L BKA, tobacco abuse who presents to the ED with complaint of dib/wheezing.  Patient reports that he woke up suddenly with difficulty breathing, wheezing.  Does not use home oxygen, no formal diagnosis of COPD or asthma.  EMS unsure pulse ox on arrival, they placed him on nebulized breathing treatment which she received x 2 with significant improvement to his respiratory status.  On arrival he feels he is breathing comfortably, no chest pain or significant tightness.  No diaphoresis, lightheadedness, nausea or vomiting.  Reports he went to bed in his typical state of health.  Reports he has exertional dyspnea at times but this is worse than it has been previously.  Continue to smoke cigarettes, no home oxygen use no fevers or chills  Past Medical History Past Medical History:  Diagnosis Date   Cigarette smoker    Coronary artery disease    Diabetes mellitus    type 1  x 50 yrs   Diabetic retinopathy    Dyslipidemia    Hypertension    Peripheral vascular disease    Patient Active Problem List   Diagnosis Date Noted   Below-knee amputation of left lower extremity 12/06/2015   PAD (peripheral artery disease) 10/24/2015   Diabetes mellitus with peripheral artery disease 07/30/2014   Claudication in peripheral vascular disease 07/30/2014   Diabetes type 2, controlled 01/23/2013   CAD (coronary artery disease) 04/15/2011   Hyperlipidemia 04/15/2011   Home Medication(s) Prior to Admission medications   Medication Sig Start Date End Date Taking? Authorizing Provider  albuterol (VENTOLIN HFA) 108 (90 Base) MCG/ACT inhaler Inhale 1-2 puffs into the lungs every 6 (six) hours as needed for wheezing or shortness of breath. 11/27/22   Yes Sloan Leiter, DO  predniSONE (DELTASONE) 50 MG tablet Take 1 tablet (50 mg total) by mouth daily for 5 days. 11/28/22 12/03/22 Yes Sloan Leiter, DO  acetaminophen (TYLENOL) 500 MG tablet Take 1,000 mg by mouth 2 (two) times daily as needed (pain).     [provider]  aspirin EC 81 MG tablet Take 243 mg by mouth daily.     [provider]  baclofen (LIORESAL) 20 MG tablet Take 20 mg by mouth daily as needed (for leg cramps). Reported on 12/05/2015 09/09/15   [provider]  calcium carbonate (TUMS - DOSED IN MG ELEMENTAL CALCIUM) 500 MG chewable tablet Chew 2 tablets by mouth every 6 (six) hours as needed for indigestion or heartburn.    [provider]  folic acid (FOLVITE) 800 MCG tablet Take 1,600 mcg by mouth daily.     [provider]  HUMALOG 100 UNIT/ML injection PUMP 01/09/19   [provider]  metoprolol succinate (TOPROL-XL) 25 MG 24 hr tablet Take 1 tablet (25 mg total) by mouth daily. 02/14/13   Nahser, Deloris Ping, MD  Multiple Vitamins-Minerals (MULTIVITAMIN GUMMIES ADULT PO) Take 2 tablets by mouth daily.     [provider]  [facility] ULTRA test strip  12/31/19   [provider]  ramipril (ALTACE) 10 MG tablet Take 10 mg by mouth daily.      [provider]  rosuvastatin (CRESTOR) 10 MG tablet TAKE 1 TABLET BY MOUTH  DAILY 06/24/20   Carlynn Purl, [place]  Salena Saner, PA-C                                                                                                                                    Past Surgical History Past Surgical History:  Procedure Laterality Date   ABDOMINAL ANGIOGRAM N/A 05/29/2014   Procedure: ABDOMINAL ANGIOGRAM;  Surgeon: Pamella Pert, MD;  Location: [facility] CATH LAB;  Service: Cardiovascular;  Laterality: N/A;   ABDOMINAL AORTOGRAM W/LOWER EXTREMITY N/A 11/14/2019   Procedure: ABDOMINAL AORTOGRAM W/LOWER EXTREMITY;  Surgeon: Nada Libman, MD;  Location: [facility] INVASIVE CV LAB;  Service:  Cardiovascular;  Laterality: N/A;   AMPUTATION Left 10/25/2015   Procedure: Left Foot 5th Ray Amputation;  Surgeon: Nadara Mustard, MD;  Location: [facility] OR;  Service: Orthopedics;  Laterality: Left;   AMPUTATION Left 12/06/2015   Procedure: AMPUTATION BELOW KNEE;  Surgeon: Nadara Mustard, MD;  Location: [facility] OR;  Service: Orthopedics;  Laterality: Left;   CARDIAC CATHETERIZATION     EF is 55-60% and no wall motion abnormalities (long long time ago)   FEMORAL-POPLITEAL BYPASS GRAFT Left 10/24/2015   Procedure: BYPASS GRAFT FEMORAL below knee POPLITEAL ARTERY with Left Saphenous Vein;  Surgeon: Nada Libman, MD;  Location: [facility] OR;  Service: Vascular;  Laterality: Left;   FEMORAL-POPLITEAL BYPASS GRAFT Right 11/15/2019   Procedure: BYPASS GRAFT FEMORAL-POPLITEAL ARTERY using Right Leg Greater Saphenous Vein;  Surgeon: Nada Libman, MD;  Location: [facility] OR;  Service: Vascular;  Laterality: Right;   FRACTURE SURGERY     left arm "many yrs ago"   LOWER EXTREMITY ANGIOGRAM N/A 01/23/2014   Procedure: LOWER EXTREMITY ANGIOGRAM;  Surgeon: Pamella Pert, MD;  Location: [facility] CATH LAB;  Service: Cardiovascular;  Laterality: N/A;   LOWER EXTREMITY ANGIOGRAM N/A 07/31/2014   Procedure: LOWER EXTREMITY ANGIOGRAM;  Surgeon: Pamella Pert, MD;  Location: [facility] CATH LAB;  Service: Cardiovascular;  Laterality: N/A;   LOWER EXTREMITY ANGIOGRAM Left 10/11/2015   Procedure: Lower Extremity Angiogram;  Surgeon: Nada Libman, MD;  Location: [facility] INVASIVE CV LAB;  Service: Cardiovascular;  Laterality: Left;   PERIPHERAL VASCULAR BALLOON ANGIOPLASTY  11/14/2019   Procedure: PERIPHERAL VASCULAR BALLOON ANGIOPLASTY;  Surgeon: Nada Libman, MD;  Location: [facility] INVASIVE CV LAB;  Service: Cardiovascular;;   PERIPHERAL VASCULAR CATHETERIZATION Left 10/11/2015   Procedure: Peripheral Vascular Balloon Angioplasty;  Surgeon: Nada Libman, MD;  Location: [facility] INVASIVE CV LAB;  Service: Cardiovascular;  Laterality: Left;  sfa failed unable  to cross occluded sfa   PERIPHERAL VASCULAR CATHETERIZATION N/A 10/11/2015   Procedure: Abdominal Aortogram;  Surgeon: Nada Libman, MD;  Location: [facility] INVASIVE CV LAB;  Service: Cardiovascular;  Laterality: N/A;   STUMP REVISION Left 03/02/2016   Procedure: Revision Left Below Knee Amputation;  Surgeon: Nadara Mustard, MD;  Location: [facility] OR;  Service: Orthopedics;  Laterality: Left;   Family History Family History  Problem Relation Age of  Onset   Coronary artery disease Mother    Other Father        Declined after a fall    Social History Social History   Tobacco Use   Smoking status: Every Day    Packs/day: 0.50    Years: 30.00    Additional pack years: 0.00    Total pack years: 15.00    Types: Cigarettes   Smokeless tobacco: Never  Vaping Use   Vaping Use: Never used  Substance Use Topics   Alcohol use: Yes    Comment: on occasion wine   Drug use: No   Allergies Simvastatin  Review of Systems Review of Systems  Constitutional:  Negative for chills and fever.  HENT:  Negative for facial swelling and trouble swallowing.   Eyes:  Negative for photophobia and visual disturbance.  Respiratory:  Positive for cough, chest tightness, shortness of breath and wheezing.   Cardiovascular:  Negative for chest pain and palpitations.  Gastrointestinal:  Negative for abdominal pain, nausea and vomiting.  Endocrine: Negative for polydipsia and polyuria.  Genitourinary:  Negative for difficulty urinating and hematuria.  Musculoskeletal:  Negative for gait problem and joint swelling.  Skin:  Negative for pallor and rash.  Neurological:  Negative for syncope and headaches.  Psychiatric/Behavioral:  Negative for agitation and confusion.     Physical Exam Vital Signs  I have reviewed the triage vital signs BP 117/65   Pulse 67   Temp 97.8 F (36.6 C) (Oral)   Resp (!) 24   Ht 5\' 11"  (1.803 m)   Wt 68 kg   SpO2 95%   BMI 20.92 kg/m  Physical Exam Vitals and nursing note  reviewed.  Constitutional:      General: He is not in acute distress.    Appearance: He is well-developed. He is not diaphoretic.  HENT:     Head: Normocephalic and atraumatic.     Right Ear: External ear normal.     Left Ear: External ear normal.     Mouth/Throat:     Mouth: Mucous membranes are moist.  Eyes:     General: No scleral icterus. Cardiovascular:     Rate and Rhythm: Normal rate and regular rhythm.     Pulses: Normal pulses.     Heart sounds: Normal heart sounds.  Pulmonary:     Effort: Pulmonary effort is normal. Tachypnea present. No accessory muscle usage or respiratory distress.     Breath sounds: Wheezing present.  Abdominal:     General: Abdomen is flat.     Palpations: Abdomen is soft.     Tenderness: There is no abdominal tenderness.  Musculoskeletal:     Right lower leg: No edema.     Left lower leg: No edema.  Skin:    General: Skin is warm and dry.     Capillary Refill: Capillary refill takes less than 2 seconds.  Neurological:     Mental Status: He is alert and oriented to person, place, and time.     GCS: GCS eye subscore is 4. GCS verbal subscore is 5. GCS motor subscore is 6.  Psychiatric:        Mood and Affect: Mood normal.        Behavior: Behavior normal.     ED Results and Treatments Labs (all labs ordered are listed, but only abnormal results are displayed) Labs Reviewed  CBC WITH DIFFERENTIAL/PLATELET - Abnormal; Notable for the following components:      Result Value  RBC 4.04 (*)    HCT 38.2 (*)    All other components within normal limits  BLOOD GAS, VENOUS - Abnormal; Notable for the following components:   pO2, Ven <31 (*)    Bicarbonate 29.7 (*)    Acid-Base Excess 2.6 (*)    All other components within normal limits  COMPREHENSIVE METABOLIC PANEL - Abnormal; Notable for the following components:   Glucose, Bld 228 (*)    Calcium 8.8 (*)    All other components within normal limits  TROPONIN I (HIGH SENSITIVITY) -  Abnormal; Notable for the following components:   Troponin I (High Sensitivity) 19 (*)    All other components within normal limits  SARS CORONAVIRUS 2 BY RT PCR  BRAIN NATRIURETIC PEPTIDE  TROPONIN I (HIGH SENSITIVITY)                                                                                                                          Radiology DG Chest Port 1 View  Result Date: 11/27/2022 CLINICAL DATA:  Wheezing, shortness of breath, COPD. EXAM: PORTABLE CHEST 1 VIEW COMPARISON:  AP chest 02/25/2016 FINDINGS: There is overlying monitor wiring. The heart size and mediastinal contours are within normal limits. There is progressive subpleural reticulation in the lung bases. No focal pneumonia is seen. The lungs are otherwise clear. The visualized skeletal structures are unremarkable. IMPRESSION: Progressive subpleural reticulation in the lung bases consistent with progressive interstitial lung disease. Electronically Signed   By: Almira BarKeith  Chesser M.D.   On: 11/27/2022 05:59    Pertinent labs & imaging results that were available during my care of the patient were reviewed by me and considered in my medical decision making (see MDM for details).  Medications Ordered in ED Medications  methylPREDNISolone sodium succinate (SOLU-MEDROL) 125 mg/2 mL injection 62.5 mg (62.5 mg Intravenous Given 11/27/22 0532)  magnesium sulfate IVPB 2 g 50 mL (0 g Intravenous Stopped 11/27/22 0636)                                                                                                                                     Procedures Procedures  (including critical care time)  Medical Decision Making / ED Course    Medical Decision Making:    [name] BostonStephen L [name] is a 72 y.o. male with past medical history as below, significant for DM, HLD, CAD, L BKA, tobacco abuse  who presents to the ED with complaint of dib/wheezing. . The complaint involves an extensive differential diagnosis and also carries with it a high  risk of complications and morbidity.  Serious etiology was considered. Ddx includes but is not limited to: In my evaluation of this patient's dyspnea my DDx includes, but is not limited to, pneumonia, pulmonary embolism, pneumothorax, pulmonary edema, metabolic acidosis, asthma, COPD, cardiac cause, anemia, anxiety, etc.    Complete initial physical exam performed, notably the patient  was audibly wheezing, no respiratory distress, pulse ox 95% on ambient air.    Reviewed and confirmed nursing documentation for past medical history, family history, social history.  Vital signs reviewed.    Clinical Course as of 11/27/22 [id number]  Fri Nov 27, 2022  0522 Patient with ongoing wheezing on arrival, received nebulized breathing treatment x 2 by EMS.  Will give Solu-Medrol and mag sulfate. [SG]  0547 On recheck patient reports that he is feeling better, breathing comfortably, no hypoxia [SG]  0613 CXR c/w ILD, no PNA [SG]  0619 Troponin I (High Sensitivity)(!): 19 No ongoing chest pain, EKG w/o acute ischemia; will get delta  [SG]    Clinical Course User Index [SG] Tanda RockersGray, Rosetta Rupnow A, DO   Improved, breathing well on ambient air. Labs / imaging reviewed; high suspicion for COPD exacerbation.  Signed out to incoming EDP pending delta trop, if this is flat/stable would be reasonable for discharge and close o/p f/u I do not see pulm eval in the past, would be reasonable to have him f/u with pulm   Additional history obtained: -Additional history obtained from ems -External records from outside source obtained and reviewed including: Chart review including previous notes, labs, imaging, consultation notes including primary care documentation, home medications, prior labs and imaging Follow[facility] cardiology Dr Jacinto HalimGanji, recent cardiology office note reviewed   Lab Tests: -I ordered, reviewed, and interpreted labs.   The pertinent results include:   Labs Reviewed  CBC WITH DIFFERENTIAL/PLATELET -  Abnormal; Notable for the following components:      Result Value   RBC 4.04 (*)    HCT 38.2 (*)    All other components within normal limits  BLOOD GAS, VENOUS - Abnormal; Notable for the following components:   pO2, Ven <31 (*)    Bicarbonate 29.7 (*)    Acid-Base Excess 2.6 (*)    All other components within normal limits  COMPREHENSIVE METABOLIC PANEL - Abnormal; Notable for the following components:   Glucose, Bld 228 (*)    Calcium 8.8 (*)    All other components within normal limits  TROPONIN I (HIGH SENSITIVITY) - Abnormal; Notable for the following components:   Troponin I (High Sensitivity) 19 (*)    All other components within normal limits  SARS CORONAVIRUS 2 BY RT PCR  BRAIN NATRIURETIC PEPTIDE  TROPONIN I (HIGH SENSITIVITY)    Notable for stable  EKG   EKG Interpretation  Date/Time:  Friday November 27 2022 05:07:08 EDT Ventricular Rate:  73 PR Interval:  154 QRS Duration: 90 QT Interval:  380 QTC Calculation: 419 R Axis:   71 Text Interpretation: Sinus rhythm Similar to prior tracing, no STEMI Confirmed by Tanda RockersGray, Alexavier Tsutsui ([id number]) on 11/27/2022 5:23:02 AM         Imaging Studies ordered: I ordered imaging studies including CXR I independently visualized the following imaging with scope of interpretation limited to determining acute life threatening conditions related to emergency care; findings noted above, significant for ILD I independently  visualized and interpreted imaging. I agree with the radiologist interpretation   Medicines ordered and prescription drug management: Meds ordered this encounter  Medications   methylPREDNISolone sodium succinate (SOLU-MEDROL) 125 mg/2 mL injection 62.5 mg   magnesium sulfate IVPB 2 g 50 mL   predniSONE (DELTASONE) 50 MG tablet    Sig: Take 1 tablet (50 mg total) by mouth daily for 5 days.    Dispense:  5 tablet    Refill:  0   albuterol (VENTOLIN HFA) 108 (90 Base) MCG/ACT inhaler    Sig: Inhale 1-2 puffs into the  lungs every 6 (six) hours as needed for wheezing or shortness of breath.    Dispense:  1 each    Refill:  1    -I have reviewed the patients home medicines and have made adjustments as needed   Consultations Obtained: na   Cardiac Monitoring: The patient was maintained on a cardiac monitor.  I personally viewed and interpreted the cardiac monitored which showed an underlying rhythm of: SR  Social Determinants of Health:  Diagnosis or treatment significantly limited by social determinants of health: current smoker Counseled patient for approximately 3 minutes regarding smoking cessation. Discussed risks of smoking and how they applied and affected their visit here today. Patient not ready to quit at this time, however will follow up with their primary doctor when they are.   CPT code: 40981: intermediate counseling for smoking cessation     Reevaluation: After the interventions noted above, I reevaluated the patient and found that they have improved  Co morbidities that complicate the patient evaluation  Past Medical History:  Diagnosis Date   Cigarette smoker    Coronary artery disease    Diabetes mellitus    type 1  x 50 yrs   Diabetic retinopathy    Dyslipidemia    Hypertension    Peripheral vascular disease       Dispostion: Disposition decision including need for hospitalization was considered, and patient disposition pending at time of sign out.    Final Clinical Impression(s) / ED Diagnoses Final diagnoses:  COPD exacerbation  Dyspnea, unspecified type     This chart was dictated using voice recognition software.  Despite best efforts to proofread,  errors can occur which can change the documentation meaning.    Sloan Leiter, DO 11/27/22 [phone]

## 2022-11-27 NOTE — Progress Notes (Signed)
  Echocardiogram 2D Echocardiogram has been performed.  Janalyn Harder 11/27/2022, 12:05 PM

## 2022-11-27 NOTE — Discharge Instructions (Addendum)
It was a pleasure caring for you today in the emergency department.  Your heart blood test called a troponin was mildly elevated today.  This is probably due to your COPD exacerbation but she need to follow-up with your cardiologist, Dr. Jacinto Halim.  They should call you for an appointment but if you have not heard by 4/8, call their office.  Otherwise we have prescribed steroids and a refill of your inhaler.  Please return to the emergency department for any worsening or worrisome symptoms.  Please stop smoking

## 2022-11-27 NOTE — ED Notes (Signed)
CRITICAL VALUE STICKER  CRITICAL VALUE: Troponin 139  RECEIVER (on-site recipient of call):  DATE & TIME NOTIFIED: 1058, 11/27/2022  MESSENGER (representative from lab):  MD NOTIFIED: Criss Alvine  TIME OF NOTIFICATION:  RESPONSE: Orders received.

## 2022-11-28 LAB — TROPONIN I (HIGH SENSITIVITY): Troponin I (High Sensitivity): 139 ng/L (ref <18)

## 2022-12-01 ENCOUNTER — Ambulatory Visit: Payer: Medicare Other | Admitting: Cardiology

## 2022-12-04 ENCOUNTER — Other Ambulatory Visit: Payer: Self-pay | Admitting: Registered Nurse

## 2022-12-04 DIAGNOSIS — R9389 Abnormal findings on diagnostic imaging of other specified body structures: Secondary | ICD-10-CM

## 2022-12-08 ENCOUNTER — Encounter: Payer: Self-pay | Admitting: Cardiology

## 2022-12-09 ENCOUNTER — Emergency Department (HOSPITAL_COMMUNITY): Payer: Medicare Other

## 2022-12-09 ENCOUNTER — Inpatient Hospital Stay (HOSPITAL_COMMUNITY)
Admission: EM | Admit: 2022-12-09 | Discharge: 2022-12-17 | DRG: 011 | Disposition: A | Discharge status: Home / Self Care | Payer: Medicare Other | Attending: Internal Medicine | Admitting: Internal Medicine

## 2022-12-09 ENCOUNTER — Encounter (HOSPITAL_COMMUNITY): Payer: Self-pay

## 2022-12-09 ENCOUNTER — Other Ambulatory Visit: Payer: Self-pay

## 2022-12-09 DIAGNOSIS — L8990 Pressure ulcer of unspecified site, unspecified stage: Secondary | ICD-10-CM | POA: Insufficient documentation

## 2022-12-09 DIAGNOSIS — L89892 Pressure ulcer of other site, stage 2: Secondary | ICD-10-CM | POA: Diagnosis not present

## 2022-12-09 DIAGNOSIS — E119 Type 2 diabetes mellitus without complications: Secondary | ICD-10-CM | POA: Diagnosis present

## 2022-12-09 DIAGNOSIS — E43 Unspecified severe protein-calorie malnutrition: Secondary | ICD-10-CM | POA: Insufficient documentation

## 2022-12-09 DIAGNOSIS — Z89512 Acquired absence of left leg below knee: Secondary | ICD-10-CM | POA: Diagnosis not present

## 2022-12-09 DIAGNOSIS — E10319 Type 1 diabetes mellitus with unspecified diabetic retinopathy without macular edema: Secondary | ICD-10-CM | POA: Diagnosis present

## 2022-12-09 DIAGNOSIS — Z930 Tracheostomy status: Secondary | ICD-10-CM

## 2022-12-09 DIAGNOSIS — Z681 Body mass index (BMI) 19 or less, adult: Secondary | ICD-10-CM

## 2022-12-09 DIAGNOSIS — Z79899 Other long term (current) drug therapy: Secondary | ICD-10-CM | POA: Diagnosis not present

## 2022-12-09 DIAGNOSIS — E1151 Type 2 diabetes mellitus with diabetic peripheral angiopathy without gangrene: Secondary | ICD-10-CM | POA: Diagnosis present

## 2022-12-09 DIAGNOSIS — Z720 Tobacco use: Secondary | ICD-10-CM | POA: Diagnosis present

## 2022-12-09 DIAGNOSIS — E871 Hypo-osmolality and hyponatremia: Secondary | ICD-10-CM | POA: Diagnosis not present

## 2022-12-09 DIAGNOSIS — Z7982 Long term (current) use of aspirin: Secondary | ICD-10-CM

## 2022-12-09 DIAGNOSIS — D3502 Benign neoplasm of left adrenal gland: Secondary | ICD-10-CM | POA: Diagnosis not present

## 2022-12-09 DIAGNOSIS — E1065 Type 1 diabetes mellitus with hyperglycemia: Secondary | ICD-10-CM | POA: Diagnosis present

## 2022-12-09 DIAGNOSIS — J95811 Postprocedural pneumothorax: Secondary | ICD-10-CM

## 2022-12-09 DIAGNOSIS — E1051 Type 1 diabetes mellitus with diabetic peripheral angiopathy without gangrene: Secondary | ICD-10-CM | POA: Diagnosis present

## 2022-12-09 DIAGNOSIS — Z8249 Family history of ischemic heart disease and other diseases of the circulatory system: Secondary | ICD-10-CM

## 2022-12-09 DIAGNOSIS — J387 Other diseases of larynx: Secondary | ICD-10-CM | POA: Diagnosis present

## 2022-12-09 DIAGNOSIS — Z1152 Encounter for screening for COVID-19: Secondary | ICD-10-CM | POA: Diagnosis not present

## 2022-12-09 DIAGNOSIS — J441 Chronic obstructive pulmonary disease with (acute) exacerbation: Secondary | ICD-10-CM | POA: Diagnosis not present

## 2022-12-09 DIAGNOSIS — J982 Interstitial emphysema: Secondary | ICD-10-CM | POA: Diagnosis not present

## 2022-12-09 DIAGNOSIS — J9601 Acute respiratory failure with hypoxia: Secondary | ICD-10-CM | POA: Diagnosis not present

## 2022-12-09 DIAGNOSIS — R634 Abnormal weight loss: Secondary | ICD-10-CM | POA: Diagnosis present

## 2022-12-09 DIAGNOSIS — I2510 Atherosclerotic heart disease of native coronary artery without angina pectoris: Secondary | ICD-10-CM | POA: Diagnosis present

## 2022-12-09 DIAGNOSIS — I10 Essential (primary) hypertension: Secondary | ICD-10-CM | POA: Diagnosis present

## 2022-12-09 DIAGNOSIS — S88112A Complete traumatic amputation at level between knee and ankle, left lower leg, initial encounter: Secondary | ICD-10-CM | POA: Diagnosis present

## 2022-12-09 DIAGNOSIS — E109 Type 1 diabetes mellitus without complications: Secondary | ICD-10-CM | POA: Diagnosis present

## 2022-12-09 DIAGNOSIS — Z9641 Presence of insulin pump (external) (internal): Secondary | ICD-10-CM | POA: Diagnosis not present

## 2022-12-09 DIAGNOSIS — E785 Hyperlipidemia, unspecified: Secondary | ICD-10-CM | POA: Diagnosis present

## 2022-12-09 DIAGNOSIS — R0602 Shortness of breath: Secondary | ICD-10-CM | POA: Diagnosis present

## 2022-12-09 DIAGNOSIS — I739 Peripheral vascular disease, unspecified: Secondary | ICD-10-CM | POA: Diagnosis present

## 2022-12-09 DIAGNOSIS — D649 Anemia, unspecified: Secondary | ICD-10-CM | POA: Diagnosis not present

## 2022-12-09 DIAGNOSIS — C329 Malignant neoplasm of larynx, unspecified: Principal | ICD-10-CM | POA: Diagnosis present

## 2022-12-09 DIAGNOSIS — F17210 Nicotine dependence, cigarettes, uncomplicated: Secondary | ICD-10-CM | POA: Diagnosis not present

## 2022-12-09 LAB — CBG MONITORING, ED
Glucose-Capillary: 192 mg/dL — ABNORMAL HIGH (ref 70–99)
Glucose-Capillary: 296 mg/dL — ABNORMAL HIGH (ref 70–99)
Glucose-Capillary: 250 mg/dL — ABNORMAL HIGH (ref 70–99)

## 2022-12-09 LAB — COMPREHENSIVE METABOLIC PANEL
ALT: 24 U/L (ref 0–44)
AST: 28 U/L (ref 15–41)
Albumin: 3.9 g/dL (ref 3.5–5.0)
Alkaline Phosphatase: 71 U/L (ref 38–126)
Anion gap: 10 (ref 5–15)
BUN: 19 mg/dL (ref 8–23)
CO2: 28 mmol/L (ref 22–32)
Calcium: 10 mg/dL (ref 8.9–10.3)
Chloride: 97 mmol/L — ABNORMAL LOW (ref 98–111)
Creatinine, Ser: 0.79 mg/dL (ref 0.61–1.24)
GFR, Estimated: >60 mL/min (ref >60)
Glucose, Bld: 183 mg/dL — ABNORMAL HIGH (ref 70–99)
Potassium: 4.1 mmol/L (ref 3.5–5.1)
Sodium: 135 mmol/L (ref 135–145)
Total Bilirubin: 0.7 mg/dL (ref 0.3–1.2)
Total Protein: 7 g/dL (ref 6.5–8.1)

## 2022-12-09 LAB — GLUCOSE, CAPILLARY
Glucose-Capillary: 239 mg/dL — ABNORMAL HIGH (ref 70–99)
Glucose-Capillary: 477 mg/dL — ABNORMAL HIGH (ref 70–99)

## 2022-12-09 LAB — CBC
HCT: 39.7 % (ref 39.0–52.0)
Hemoglobin: 13.6 g/dL (ref 13.0–17.0)
MCH: 32.7 pg (ref 26.0–34.0)
MCHC: 34.3 g/dL (ref 30.0–36.0)
MCV: 95.4 fL (ref 80.0–100.0)
Platelets: 382 10*3/uL (ref 150–400)
RBC: 4.16 MIL/uL — ABNORMAL LOW (ref 4.22–5.81)
RDW: 13.6 % (ref 11.5–15.5)
WBC: 12 10*3/uL — ABNORMAL HIGH (ref 4.0–10.5)
nRBC: 0 % (ref 0.0–0.2)

## 2022-12-09 LAB — RESPIRATORY PANEL BY PCR

## 2022-12-09 LAB — BLOOD GAS, VENOUS
Acid-Base Excess: 0.4 mmol/L (ref 0.0–2.0)
Bicarbonate: 26.5 mmol/L (ref 20.0–28.0)
O2 Saturation: 81.4 %
Patient temperature: 37
pCO2, Ven: 48 mmHg (ref 44–60)
pH, Ven: 7.35 (ref 7.25–7.43)
pO2, Ven: 48 mmHg — ABNORMAL HIGH (ref 32–45)

## 2022-12-09 LAB — HEMOGLOBIN A1C
Hgb A1c MFr Bld: 8 % — ABNORMAL HIGH (ref 4.8–5.6)
Mean Plasma Glucose: 182.9 mg/dL

## 2022-12-09 MED ORDER — IPRATROPIUM BROMIDE 0.02 % IN SOLN
0.5000 mg | Freq: Once | RESPIRATORY_TRACT | Status: AC
  Administered 2022-12-09: 0.5 mg via RESPIRATORY_TRACT
  Filled 2022-12-09: qty 2.5

## 2022-12-09 MED ORDER — ACETAMINOPHEN 325 MG PO TABS: 650.0000 mg | ORAL_TABLET | Freq: Four times a day (QID) | ORAL | Status: DC | PRN

## 2022-12-09 MED ORDER — PREDNISONE 20 MG PO TABS: 40.0000 mg | ORAL_TABLET | Freq: Every day | ORAL | Status: DC

## 2022-12-09 MED ORDER — ENOXAPARIN SODIUM 40 MG/0.4ML IJ SOSY
40.0000 mg | PREFILLED_SYRINGE | INTRAMUSCULAR | Status: DC
  Administered 2022-12-09 – 2022-12-16 (×8): 40 mg via SUBCUTANEOUS
  Filled 2022-12-09 (×8): qty 0.4

## 2022-12-09 MED ORDER — FOLIC ACID 1 MG PO TABS
1500.0000 ug | ORAL_TABLET | Freq: Every day | ORAL | Status: DC
  Administered 2022-12-09 – 2022-12-16 (×7): 1.5 mg via ORAL
  Filled 2022-12-09 (×7): qty 2

## 2022-12-09 MED ORDER — METOPROLOL SUCCINATE ER 25 MG PO TB24
25.0000 mg | ORAL_TABLET | Freq: Every day | ORAL | Status: DC
  Administered 2022-12-09 – 2022-12-16 (×5): 25 mg via ORAL
  Filled 2022-12-09 (×6): qty 1

## 2022-12-09 MED ORDER — ALBUTEROL SULFATE (2.5 MG/3ML) 0.083% IN NEBU
INHALATION_SOLUTION | RESPIRATORY_TRACT | Status: AC
  Filled 2022-12-09: qty 3

## 2022-12-09 MED ORDER — IPRATROPIUM-ALBUTEROL 0.5-2.5 (3) MG/3ML IN SOLN
3.0000 mL | Freq: Once | RESPIRATORY_TRACT | Status: AC
  Administered 2022-12-09: 3 mL via RESPIRATORY_TRACT

## 2022-12-09 MED ORDER — INSULIN ASPART 100 UNIT/ML IJ SOLN
0.0000 [IU] | Freq: Three times a day (TID) | INTRAMUSCULAR | Status: DC
  Filled 2022-12-09: qty 0.2

## 2022-12-09 MED ORDER — RAMIPRIL 5 MG PO CAPS
5.0000 mg | ORAL_CAPSULE | Freq: Every day | ORAL | Status: DC
  Administered 2022-12-09 – 2022-12-16 (×6): 5 mg via ORAL
  Filled 2022-12-09 (×9): qty 1

## 2022-12-09 MED ORDER — IPRATROPIUM-ALBUTEROL 0.5-2.5 (3) MG/3ML IN SOLN
RESPIRATORY_TRACT | Status: AC
  Administered 2022-12-09: 3 mL
  Filled 2022-12-09: qty 3

## 2022-12-09 MED ORDER — ASPIRIN 325 MG PO TBEC
325.0000 mg | DELAYED_RELEASE_TABLET | Freq: Every day | ORAL | Status: DC
  Administered 2022-12-09 – 2022-12-16 (×5): 325 mg via ORAL
  Filled 2022-12-09 (×8): qty 1

## 2022-12-09 MED ORDER — CALCIUM CARBONATE ANTACID 500 MG PO CHEW: 1000.0000 mg | CHEWABLE_TABLET | ORAL | Status: DC | PRN

## 2022-12-09 MED ORDER — DEXAMETHASONE SODIUM PHOSPHATE 10 MG/ML IJ SOLN
10.0000 mg | Freq: Once | INTRAMUSCULAR | Status: AC
  Administered 2022-12-09: 10 mg via INTRAVENOUS
  Filled 2022-12-09: qty 1

## 2022-12-09 MED ORDER — INSULIN PUMP
Freq: Three times a day (TID) | SUBCUTANEOUS | Status: DC
  Administered 2022-12-12: 0.9 via SUBCUTANEOUS
  Administered 2022-12-12: 0.5 via SUBCUTANEOUS
  Administered 2022-12-12: 1.7 via SUBCUTANEOUS
  Administered 2022-12-13: 1.9 via SUBCUTANEOUS
  Administered 2022-12-13: 1.7 via SUBCUTANEOUS
  Administered 2022-12-13: 4.5 via SUBCUTANEOUS
  Administered 2022-12-14: 1.7 via SUBCUTANEOUS
  Administered 2022-12-14: 4.5 via SUBCUTANEOUS
  Administered 2022-12-14: 5.3 via SUBCUTANEOUS
  Administered 2022-12-14: 5.1 via SUBCUTANEOUS
  Administered 2022-12-15: 5.8 via SUBCUTANEOUS
  Administered 2022-12-15: 5.5 via SUBCUTANEOUS
  Administered 2022-12-15: 4 via SUBCUTANEOUS
  Administered 2022-12-16: 5.8 via SUBCUTANEOUS
  Filled 2022-12-09: qty 1

## 2022-12-09 MED ORDER — BACLOFEN 20 MG PO TABS
20.0000 mg | ORAL_TABLET | Freq: Every day | ORAL | Status: DC | PRN
  Filled 2022-12-09: qty 1

## 2022-12-09 MED ORDER — ONDANSETRON HCL 4 MG PO TABS: 4.0000 mg | ORAL_TABLET | Freq: Four times a day (QID) | ORAL | Status: DC | PRN

## 2022-12-09 MED ORDER — ONDANSETRON HCL 4 MG/2ML IJ SOLN: 4.0000 mg | Freq: Four times a day (QID) | INTRAMUSCULAR | Status: DC | PRN

## 2022-12-09 MED ORDER — ROSUVASTATIN CALCIUM 5 MG PO TABS
10.0000 mg | ORAL_TABLET | Freq: Every day | ORAL | Status: DC
  Administered 2022-12-09 – 2022-12-16 (×7): 10 mg via ORAL
  Filled 2022-12-09 (×2): qty 2
  Filled 2022-12-09: qty 1
  Filled 2022-12-09 (×4): qty 2

## 2022-12-09 MED ORDER — ALBUTEROL SULFATE (2.5 MG/3ML) 0.083% IN NEBU
2.5000 mg | INHALATION_SOLUTION | Freq: Once | RESPIRATORY_TRACT | Status: AC
  Administered 2022-12-09: 2.5 mg via RESPIRATORY_TRACT

## 2022-12-09 MED ORDER — ACETAMINOPHEN 650 MG RE SUPP: 650.0000 mg | Freq: Four times a day (QID) | RECTAL | Status: DC | PRN

## 2022-12-09 MED ORDER — IPRATROPIUM-ALBUTEROL 0.5-2.5 (3) MG/3ML IN SOLN
3.0000 mL | Freq: Four times a day (QID) | RESPIRATORY_TRACT | Status: DC
  Administered 2022-12-09: 3 mL via RESPIRATORY_TRACT
  Filled 2022-12-09 (×2): qty 3

## 2022-12-09 MED ORDER — ALBUTEROL SULFATE (2.5 MG/3ML) 0.083% IN NEBU
10.0000 mg/h | INHALATION_SOLUTION | Freq: Once | RESPIRATORY_TRACT | Status: AC
  Administered 2022-12-09: 10 mg/h via RESPIRATORY_TRACT
  Filled 2022-12-09: qty 12

## 2022-12-09 MED ORDER — ALBUTEROL SULFATE (2.5 MG/3ML) 0.083% IN NEBU
2.5000 mg | INHALATION_SOLUTION | RESPIRATORY_TRACT | Status: DC | PRN
  Administered 2022-12-10: 2.5 mg via RESPIRATORY_TRACT
  Filled 2022-12-09: qty 3

## 2022-12-09 MED ORDER — MAGNESIUM SULFATE 2 GM/50ML IV SOLN
2.0000 g | Freq: Once | INTRAVENOUS | Status: AC
  Administered 2022-12-09: 2 g via INTRAVENOUS
  Filled 2022-12-09: qty 50

## 2022-12-09 NOTE — ED Notes (Addendum)
Pt stated he feels a lot better than when he got here. He stated the increased SoB comes when he ambulates. He currently has no complaints.

## 2022-12-09 NOTE — ED Triage Notes (Addendum)
Arrives POV with "roommate/ living partner" with sudden shob and labored breathing that began tonight. Has been using albuterol inhaler with minimal improvement.   Evaluated by EMS at home but declined transportation.

## 2022-12-09 NOTE — Progress Notes (Signed)
[facility] admitting physician addendum:  The patient asked the staff to use his insulin pump instead of the regular insulin sliding scale.  The insulin pump order set placed after discontinuation of regular insulin sliding scale.  Sanda Klein, MD..

## 2022-12-09 NOTE — ED Notes (Signed)
[facility] called. 

## 2022-12-09 NOTE — Consult Note (Signed)
Reason for Consult:stridor hoarseness Referring Physician: Dr Lance Bosch is an 72 y.o. male.  HPI: With a history of hoarseness for about 1 month.  He has had a little bit of shortness of breath a month ago and up until 2 weeks ago.  About 2 weeks ago he started having more feeling like there was some resistance to breathing.  He did note some noise that was occurring with his breathing.  He has not had any change in his voice.  He was seen in the emergency room once and given breathing treatments and thought he had a COPD exacerbation.  He is now back again with the same symptoms.  He was stridorous at the [facility] emergency room.  He has been transferred to [facility] for evaluation.  He does have smoking history  Past Medical History:  Diagnosis Date   Cigarette smoker    Coronary artery disease    Diabetes mellitus    type 1  x 50 yrs   Diabetic retinopathy    Dyslipidemia    Hypertension    Peripheral vascular disease     Past Surgical History:  Procedure Laterality Date   ABDOMINAL ANGIOGRAM N/A 05/29/2014   Procedure: ABDOMINAL ANGIOGRAM;  Surgeon: Pamella Pert, MD;  Location: [facility] CATH LAB;  Service: Cardiovascular;  Laterality: N/A;   ABDOMINAL AORTOGRAM W/LOWER EXTREMITY N/A 11/14/2019   Procedure: ABDOMINAL AORTOGRAM W/LOWER EXTREMITY;  Surgeon: Nada Libman, MD;  Location: [facility] INVASIVE CV LAB;  Service: Cardiovascular;  Laterality: N/A;   AMPUTATION Left 10/25/2015   Procedure: Left Foot 5th Ray Amputation;  Surgeon: Nadara Mustard, MD;  Location: [facility] OR;  Service: Orthopedics;  Laterality: Left;   AMPUTATION Left 12/06/2015   Procedure: AMPUTATION BELOW KNEE;  Surgeon: Nadara Mustard, MD;  Location: [facility] OR;  Service: Orthopedics;  Laterality: Left;   CARDIAC CATHETERIZATION     EF is 55-60% and no wall motion abnormalities (long long time ago)   FEMORAL-POPLITEAL BYPASS GRAFT Left 10/24/2015   Procedure: BYPASS GRAFT FEMORAL below knee POPLITEAL ARTERY with Left  Saphenous Vein;  Surgeon: Nada Libman, MD;  Location: [facility] OR;  Service: Vascular;  Laterality: Left;   FEMORAL-POPLITEAL BYPASS GRAFT Right 11/15/2019   Procedure: BYPASS GRAFT FEMORAL-POPLITEAL ARTERY using Right Leg Greater Saphenous Vein;  Surgeon: Nada Libman, MD;  Location: [facility] OR;  Service: Vascular;  Laterality: Right;   FRACTURE SURGERY     left arm "many yrs ago"   LOWER EXTREMITY ANGIOGRAM N/A 01/23/2014   Procedure: LOWER EXTREMITY ANGIOGRAM;  Surgeon: Pamella Pert, MD;  Location: [facility] CATH LAB;  Service: Cardiovascular;  Laterality: N/A;   LOWER EXTREMITY ANGIOGRAM N/A 07/31/2014   Procedure: LOWER EXTREMITY ANGIOGRAM;  Surgeon: Pamella Pert, MD;  Location: [facility] CATH LAB;  Service: Cardiovascular;  Laterality: N/A;   LOWER EXTREMITY ANGIOGRAM Left 10/11/2015   Procedure: Lower Extremity Angiogram;  Surgeon: Nada Libman, MD;  Location: [facility] INVASIVE CV LAB;  Service: Cardiovascular;  Laterality: Left;   PERIPHERAL VASCULAR BALLOON ANGIOPLASTY  11/14/2019   Procedure: PERIPHERAL VASCULAR BALLOON ANGIOPLASTY;  Surgeon: Nada Libman, MD;  Location: [facility] INVASIVE CV LAB;  Service: Cardiovascular;;   PERIPHERAL VASCULAR CATHETERIZATION Left 10/11/2015   Procedure: Peripheral Vascular Balloon Angioplasty;  Surgeon: Nada Libman, MD;  Location: [facility] INVASIVE CV LAB;  Service: Cardiovascular;  Laterality: Left;  sfa failed unable to cross occluded sfa   PERIPHERAL VASCULAR CATHETERIZATION N/A 10/11/2015   Procedure: Abdominal Aortogram;  Surgeon: Nada Libman, MD;  Location: [facility] INVASIVE CV LAB;  Service: Cardiovascular;  Laterality: N/A;   STUMP REVISION Left 03/02/2016   Procedure: Revision Left Below Knee Amputation;  Surgeon: Nadara Mustard, MD;  Location: [facility] OR;  Service: Orthopedics;  Laterality: Left;    Family History  Problem Relation Age of Onset   Coronary artery disease Mother    Other Father        Declined after a fall    Social History:  reports that he has  been smoking cigarettes. He has a 15.00 pack-year smoking history. He has never used smokeless tobacco. He reports current alcohol use. He reports that he does not use drugs.  Allergies:  Allergies  Allergen Reactions   Simvastatin Other (See Comments)    MYALGIAS, MUSCLE WEAKNESS     Medications: I have reviewed the patient's current medications.  Results for orders placed or performed during the hospital encounter of 12/09/22 (from the past 48 hour(s))  Hemoglobin A1c     Status: Abnormal   Collection Time: 12/09/22  5:34 AM  Result Value Ref Range   Hgb A1c MFr Bld 8.0 (H) 4.8 - 5.6 %    Comment: (NOTE) Pre diabetes:          5.7%-6.4%  Diabetes:              >6.4%  Glycemic control for   <7.0% adults with diabetes    Mean Plasma Glucose 182.9 mg/dL    Comment: Performed at [facility], [address] [place], [place] [address]  Comprehensive metabolic panel     Status: Abnormal   Collection Time: 12/09/22  5:35 AM  Result Value Ref Range   Sodium 135 135 - 145 mmol/L   Potassium 4.1 3.5 - 5.1 mmol/L   Chloride 97 (L) 98 - 111 mmol/L   CO2 28 22 - 32 mmol/L   Glucose, Bld 183 (H) 70 - 99 mg/dL    Comment: Glucose reference range applies only to samples taken after fasting for at least 8 hours.   BUN 19 8 - 23 mg/dL   Creatinine, Ser 6.04 0.61 - 1.24 mg/dL   Calcium 54.0 8.9 - 98.1 mg/dL   Total Protein 7.0 6.5 - 8.1 g/dL   Albumin 3.9 3.5 - 5.0 g/dL   AST 28 15 - 41 U/L   ALT 24 0 - 44 U/L   Alkaline Phosphatase 71 38 - 126 U/L   Total Bilirubin 0.7 0.3 - 1.2 mg/dL   GFR, Estimated >19 >14 mL/min    Comment: (NOTE) Calculated using the CKD-EPI Creatinine Equation (2021)    Anion gap 10 5 - 15    Comment: Performed at [facility], [address] [place], [place] [address]  CBC     Status: Abnormal   Collection Time: 12/09/22  5:35 AM  Result Value Ref Range   WBC 12.0 (H) 4.0 - 10.5 K/uL   RBC 4.16 (L) 4.22 - 5.81 MIL/uL    Hemoglobin 13.6 13.0 - 17.0 g/dL   HCT 62.1 30.8 - 65.7 %   MCV 95.4 80.0 - 100.0 fL   MCH 32.7 26.0 - 34.0 pg   MCHC 34.3 30.0 - 36.0 g/dL   RDW 84.6 96.2 - 95.2 %   Platelets 382 150 - 400 K/uL   nRBC 0.0 0.0 - 0.2 %    Comment: Performed at [facility], [address] [place], [place] [address]  Blood  gas, venous     Status: Abnormal   Collection Time: 12/09/22  6:07 AM  Result Value Ref Range   pH, Ven 7.35 7.25 - 7.43   pCO2, Ven 48 44 - 60 mmHg   pO2, Ven 48 (H) 32 - 45 mmHg   Bicarbonate 26.5 20.0 - 28.0 mmol/L   Acid-Base Excess 0.4 0.0 - 2.0 mmol/L   O2 Saturation 81.4 %   Patient temperature 37.0     Comment: Performed at [facility], [address] [place], [place] [address]  POC CBG, ED     Status: Abnormal   Collection Time: 12/09/22  6:32 AM  Result Value Ref Range   Glucose-Capillary 192 (H) 70 - 99 mg/dL    Comment: Glucose reference range applies only to samples taken after fasting for at least 8 hours.  CBG monitoring, ED     Status: Abnormal   Collection Time: 12/09/22 12:05 PM  Result Value Ref Range   Glucose-Capillary 296 (H) 70 - 99 mg/dL    Comment: Glucose reference range applies only to samples taken after fasting for at least 8 hours.  CBG monitoring, ED     Status: Abnormal   Collection Time: 12/09/22  2:12 PM  Result Value Ref Range   Glucose-Capillary 250 (H) 70 - 99 mg/dL    Comment: Glucose reference range applies only to samples taken after fasting for at least 8 hours.  Glucose, capillary     Status: Abnormal   Collection Time: 12/09/22  4:01 PM  Result Value Ref Range   Glucose-Capillary 239 (H) 70 - 99 mg/dL    Comment: Glucose reference range applies only to samples taken after fasting for at least 8 hours.    DG Chest Portable 1 View  Result Date: 12/09/2022 CLINICAL DATA:  72 year old male with history of shortness of breath. EXAM: PORTABLE CHEST 1 VIEW COMPARISON:  Chest x-ray 11/27/2022.  FINDINGS: Lung volumes are normal. Emphysematous changes are noted. No consolidative airspace disease. No pleural effusions. No pneumothorax. No pulmonary nodule or mass noted. Pulmonary vasculature and the cardiomediastinal silhouette are within normal limits. Atherosclerosis in the thoracic aorta. IMPRESSION: 1. No radiographic evidence of acute cardiopulmonary disease. 2. Emphysema. 3. Aortic atherosclerosis. Electronically Signed   By: Trudie Reed M.D.   On: 12/09/2022 05:57    ROS Blood pressure 135/64, pulse 85, temperature (!) 97.5 F (36.4 C), temperature source Oral, resp. rate 20, height  (1.803 m), weight 58.6 kg, SpO2 99 %. Physical Exam HENT:     Head: Normocephalic.     Comments: Fiberoptic exam reveals he has a exophytic mass of the right vocal cord that appears to be extending through the entire cord but the exam is difficult with the disposable scope and his discomfort.  There is also a polyp at the anterior commissure that is slightly exophytic probably tumorous and then a more benign looking polyp of the left vocal cord that ball-valve's into the glottis.  This does explain his stridorous activity.  Difficult to say because there is some limitation due to the anterior commissure mass effect but it does look like both vocal cords move. Eyes:     Extraocular Movements: Extraocular movements intact.     Pupils: Pupils are equal, round, and reactive to light.  Musculoskeletal:     Cervical back: Normal range of motion.  Neurological:     Mental Status: He is alert.       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  He does need a direct laryngoscopy and biopsy.  He also needs a CT scan with contrast.  If that could be ordered that would be helpful in expediting his workup.  I am rotating off the service in the morning and will  pass this along to the next otolaryngologist that will be taking over for a treatment plan.  Suzanna Obey 12/09/2022, 8:49 PM

## 2022-12-09 NOTE — ED Provider Notes (Signed)
Clinical Course as of 12/09/22 1050  Wed Dec 09, 2022  1011 Patient states he is feeling better.  Would like to try and walk around to make sure he does not get short of breath again [JK]  1020 Patient feeling more short of breath again after trying to walk around.  He requires oxygen [JK]  1036 Reviewed with Dr Jearld Fenton.  Would like pt to be admitted to [facility].  Will see pt.  Would like to be called.once pt arrives [JK]    Clinical Course User Index [JK] Linwood Dibbles, MD   Patient initially seen by Dr. Eudelia Bunch.  Please see his note.  Plan was to reassess after his breathing treatments.  Patient continues to feel short of breath although he did not have some initial improvement.  My exam he does sound like he may have a component of upper airway obstruction.  I have consulted with Dr. Jearld Fenton ENT who will evaluate patient.  I will consult with the medical service and plan for admission to [facility].  Dr. Jearld Fenton will plan on seeing him at that facility.  Order dose of Decadron for the patient.  He is concerned about his blood sugars and I told him we will monitor that closely while in the hospital.  I have discussed the case with Dr. Robb Matar regarding admission   Linwood Dibbles, MD 12/09/22 1115

## 2022-12-09 NOTE — ED Provider Notes (Signed)
[place] EMERGENCY DEPARTMENT AT [facility] Provider Note  CSN: [id number] Arrival date & time: 12/09/22 2130  Chief Complaint(s) Shortness of Breath  HPI [name] is a 72 y.o. male with a past medical history listed below including hypertension, hyperlipidemia, diabetes and longtime smoker with likely undiagnosed COPD but known emphysema on chest x-rays here for several days of gradually worsening shortness of breath became severe this evening and not relieved by his home inhaler.  Patient denies any known fevers or chills.  Baseline cough.  No lower extremity edema.  No chest pain.  No other physical complaints.  The history is provided by the patient.    Past Medical History Past Medical History:  Diagnosis Date   Cigarette smoker    Coronary artery disease    Diabetes mellitus    type 1  x 50 yrs   Diabetic retinopathy    Dyslipidemia    Hypertension    Peripheral vascular disease    Patient Active Problem List   Diagnosis Date Noted   Dyspnea 11/27/2022   Elevated troponin 11/27/2022   Below-knee amputation of left lower extremity 12/06/2015   PAD (peripheral artery disease) 10/24/2015   Diabetes mellitus with peripheral artery disease 07/30/2014   Claudication in peripheral vascular disease 07/30/2014   Diabetes type 2, controlled 01/23/2013   CAD (coronary artery disease) 04/15/2011   Hyperlipidemia 04/15/2011   Home Medication(s) Prior to Admission medications   Medication Sig Start Date End Date Taking? Authorizing Provider  acetaminophen (TYLENOL) 500 MG tablet Take 1,000 mg by mouth 2 (two) times daily as needed (pain).     [provider]  albuterol (VENTOLIN HFA) 108 (90 Base) MCG/ACT inhaler Inhale 1-2 puffs into the lungs every 6 (six) hours as needed for wheezing or shortness of breath. 11/27/22   Sloan Leiter, DO  aspirin EC 81 MG tablet Take 243 mg by mouth daily.     [provider]  baclofen (LIORESAL) 20 MG tablet  Take 20 mg by mouth daily as needed (for leg cramps). Reported on 12/05/2015 09/09/15   [provider]  calcium carbonate (TUMS - DOSED IN MG ELEMENTAL CALCIUM) 500 MG chewable tablet Chew 2 tablets by mouth every 6 (six) hours as needed for indigestion or heartburn.    [provider]  folic acid (FOLVITE) 800 MCG tablet Take 1,600 mcg by mouth daily.     [provider]  HUMALOG 100 UNIT/ML injection PUMP 01/09/19   [provider]  metoprolol succinate (TOPROL-XL) 25 MG 24 hr tablet Take 1 tablet (25 mg total) by mouth daily. 02/14/13   Nahser, Deloris Ping, MD  Multiple Vitamins-Minerals (MULTIVITAMIN GUMMIES ADULT PO) Take 2 tablets by mouth daily.     [provider]  [facility] ULTRA test strip  12/31/19   [provider]  ramipril (ALTACE) 10 MG tablet Take 10 mg by mouth daily.      [provider]  rosuvastatin (CRESTOR) 10 MG tablet TAKE 1 TABLET BY MOUTH  DAILY 06/24/20   [place], [place] C, PA-C  Allergies Simvastatin  Review of Systems Review of Systems As noted in HPI  Physical Exam Vital Signs  I have reviewed the triage vital signs BP (!) 158/78   Pulse (!) 107   Temp (!) 97.4 F (36.3 C) (Oral)   Resp (!) 23   Ht  (1.803 m)   Wt 68 kg   SpO2 95%   BMI 20.92 kg/m   Physical Exam Vitals reviewed.  Constitutional:      General: He is not in acute distress.    Appearance: He is well-developed. He is not diaphoretic.  HENT:     Head: Normocephalic and atraumatic.     Nose: Nose normal.  Eyes:     General: No scleral icterus.       Right eye: No discharge.        Left eye: No discharge.     Conjunctiva/sclera: Conjunctivae normal.     Pupils: Pupils are equal, round, and reactive to light.  Cardiovascular:     Rate and Rhythm: Normal rate and regular rhythm.     Heart  sounds: No murmur heard.    No friction rub. No gallop.  Pulmonary:     Effort: Tachypnea, accessory muscle usage and respiratory distress present.     Breath sounds: Normal breath sounds. Decreased air movement present. No stridor.  Abdominal:     General: There is no distension.     Palpations: Abdomen is soft.     Tenderness: There is no abdominal tenderness.  Musculoskeletal:        General: No tenderness.     Cervical back: Normal range of motion and neck supple.  Skin:    General: Skin is warm and dry.     Findings: No erythema or rash.  Neurological:     Mental Status: He is alert and oriented to person, place, and time.     ED Results and Treatments Labs (all labs ordered are listed, but only abnormal results are displayed) Labs Reviewed  COMPREHENSIVE METABOLIC PANEL - Abnormal; Notable for the following components:      Result Value   Chloride 97 (*)    Glucose, Bld 183 (*)    All other components within normal limits  BLOOD GAS, VENOUS - Abnormal; Notable for the following components:   pO2, Ven 48 (*)    All other components within normal limits  CBC - Abnormal; Notable for the following components:   WBC 12.0 (*)    RBC 4.16 (*)    All other components within normal limits  CBG MONITORING, ED - Abnormal; Notable for the following components:   Glucose-Capillary 192 (*)    All other components within normal limits  RESP PANEL BY RT-PCR (RSV, FLU A&B, COVID)  RVPGX2                                                                                                                         EKG  EKG Interpretation  Date/Time:  Wednesday December 09 2022 05:22:54 EDT Ventricular Rate:  105 PR Interval:  145 QRS Duration: 83 QT Interval:  319 QTC Calculation: 422 R Axis:   70 Text Interpretation: Sinus tachycardia Biatrial enlargement Borderline repolarization abnormality Confirmed by Drema Pry [phone]) on 12/09/2022 6:25:08 AM       Radiology DG Chest  Portable 1 View  Result Date: 12/09/2022 CLINICAL DATA:  72 year old male with history of shortness of breath. EXAM: PORTABLE CHEST 1 VIEW COMPARISON:  Chest x-ray 11/27/2022. FINDINGS: Lung volumes are normal. Emphysematous changes are noted. No consolidative airspace disease. No pleural effusions. No pneumothorax. No pulmonary nodule or mass noted. Pulmonary vasculature and the cardiomediastinal silhouette are within normal limits. Atherosclerosis in the thoracic aorta. IMPRESSION: 1. No radiographic evidence of acute cardiopulmonary disease. 2. Emphysema. 3. Aortic atherosclerosis. Electronically Signed   By: Trudie Reed M.D.   On: 12/09/2022 05:57    Medications Ordered in ED Medications  albuterol (PROVENTIL) (2.5 MG/3ML) 0.083% nebulizer solution (  Not Given 12/09/22 0549)  ipratropium-albuterol (DUONEB) 0.5-2.5 (3) MG/3ML nebulizer solution (  Not Given 12/09/22 0550)  ipratropium-albuterol (DUONEB) 0.5-2.5 (3) MG/3ML nebulizer solution 3 mL (3 mLs Nebulization Given 12/09/22 0551)  albuterol (PROVENTIL) (2.5 MG/3ML) 0.083% nebulizer solution 2.5 mg (2.5 mg Nebulization Given 12/09/22 0551)  albuterol (PROVENTIL) (2.5 MG/3ML) 0.083% nebulizer solution (10 mg/hr Nebulization Given 12/09/22 0607)  ipratropium (ATROVENT) nebulizer solution 0.5 mg (0.5 mg Nebulization Given 12/09/22 0607)  magnesium sulfate IVPB 2 g 50 mL ( Intravenous Infusion Verify 12/09/22 0628)                                                                                                                                     Procedures .Critical Care  Performed by: Nira Conn, MD Authorized by: Nira Conn, MD   Critical care provider statement:    Critical care time (minutes):  45   Critical care time was exclusive of:  Separately billable procedures and treating other patients   Critical care was necessary to treat or prevent imminent or life-threatening deterioration of the following conditions:   Respiratory failure   Critical care was time spent personally by me on the following activities:  Development of treatment plan with patient or surrogate, discussions with consultants, evaluation of patient's response to treatment, examination of patient, obtaining history from patient or surrogate, review of old charts, re-evaluation of patient's condition, pulse oximetry, ordering and review of radiographic studies, ordering and review of laboratory studies and ordering and performing treatments and interventions   (including critical care time)  Medical Decision Making / ED Course  Click here for ABCD2, HEART and other calculators  Medical Decision Making Amount and/or Complexity of Data Reviewed Labs: ordered. Decision-making details documented in ED Course. Radiology: ordered and independent interpretation performed. Decision-making details documented in ED Course. ECG/medicine tests: ordered and independent interpretation performed. Decision-making details documented in ED Course.  Risk Prescription  drug management.    Patient presents with shortness of breath Most suspicious for COPD exacerbation Will assess for evidence of pneumonia, pneumothorax, pulmonary edema or pleural effusions. Will treat as COPD initially. Low suspicion for pulmonary embolism, ACS.  Doubt heart failure.  CBC with leukocytosis.  No anemia. Metabolic panel without significant electrolyte derangements.  Hyperglycemia without evidence of DKA. VBG without respiratory acidosis or hypercarbia Chest x-ray without evidence of pneumonia, pneumothorax, pulmonary edema or pleural effusions.  After continuous DuoNeb, and magnesium, patient reported feeling improved shortness of breath.  Will monitor the patient and reassess.  Patient care turned over to oncoming provider. Patient case and results discussed in detail; please see their note for further ED managment.         Final Clinical Impression(s) / ED  Diagnoses Final diagnoses:  Shortness of breath           This chart was dictated using voice recognition software.  Despite best efforts to proofread,  errors can occur which can change the documentation meaning.    Nira Conn, MD 12/09/22 [phone]

## 2022-12-09 NOTE — ED Notes (Signed)
Pt ambulated about 25 feet and he got more SoB and stated this was a shorter distance than normal. MD notified.

## 2022-12-09 NOTE — ED Notes (Signed)
RT at bedside.

## 2022-12-09 NOTE — Inpatient Diabetes Management (Signed)
Inpatient Diabetes Program Recommendations  AACE/ADA: New Consensus Statement on Inpatient Glycemic Control (2015)  Target Ranges:  Prepandial:   less than 140 mg/dL      Peak postprandial:   less than 180 mg/dL (1-2 hours)      Critically ill patients:  140 - 180 mg/dL   Lab Results  Component Value Date   GLUCAP 477 (H) 12/09/2022   HGBA1C 8.0 (H) 12/09/2022    Review of Glycemic Control  Diabetes history: DM1 x 50 years Outpatient Diabetes medications: Insulin pump - [employer] Current orders for Inpatient glycemic control: Insulin pump, Novolog 0-20 TID was d/ced.  HgbA1C - 8.0% PCP - Timothy Lasso at [facility]  Inpatient Diabetes Program Recommendations:    Insulin pump order set has been placed. Spoke with pt on phone, he was unable to tell me his basal and bolus rate at current time. Called [facility] to get pump settings and was transferred to answering service. Will get setting in am. RN had Novolog s/s order discontinued per insulin pump order set.  296, 250, 239 mg/dL  Will follow closely.  Thank you. Ailene Ards, RD, LDN, CDCES Inpatient Diabetes Coordinator [phone]

## 2022-12-09 NOTE — H&P (Signed)
History and Physical    Patient: [name] ZOX:[id number] DOB: 12/25/1950 DOA: 12/09/2022 DOS: the patient was seen and examined on 12/09/2022 PCP: [name], MD  Patient coming from: Home  Chief Complaint:  Chief Complaint  Patient presents with   Shortness of Breath   HPI: [name] is a 72 y.o. male with medical history significant of emphysema, CAD, type 1 diabetes diagnosed 50 years ago, diabetic retinopathy, hyperlipidemia, hypertension, peripheral vascular disease who presented to the emergency department complaints of sudden worsening of recent dyspnea, mostly nonproductive cough, fatigue and wheezing that began last night that did not improve with his albuterol MDI.  He has been having dyspnea for the past 2 weeks, but also stated he has lost about 30 pounds of weight over the last 6 months.  He was seen 12 days ago in the emergency department for similar symptoms.  Since then, he has not felt at baseline.  He denied fever, chills, rhinorrhea, sore throat or hemoptysis.  No pleuritic or typical chest pain, palpitations, diaphoresis, PND, orthopnea or pitting edema of the lower extremities.  No abdominal pain, nausea, emesis, diarrhea, constipation, melena or hematochezia.  No flank pain, dysuria, frequency or hematuria.  No polyuria, polydipsia, polyphagia or blurred vision.   ED course: Initial vital signs were temperature 97.4 F, pulse 107, respirations 23, BP 158/78 mmHg O2 sat 95% on room air.  The patient received a continuous albuterol 10+ ipratropium 0.5 mg neb, an albuterol 2.5 mg nebs, DuoNeb, magnesium sulfate 2 g IVPB and 10 mg of dexamethasone IVP.  Lab work: CBC 0 white count 12.0, hemoglobin 13.6 g/dL platelets 811.  Venous blood gas was normal SF4 pO2 of 48 mmHg.  CMP showed a chloride of 97 mmol/L and glucose of 183 mg/dL, the rest of the CMP measurements were normal.  Imaging: Portable 1 view chest radiograph with no radiographic evidence of acute  cardiopulmonary disease.  There is emphysema and aortic atherosclerosis.  CTA chest done 12 days ago showed no PE and similar findings.  There was also a small hiatal hernia, significant coronary artery calcifications in the left adrenal nodule with recommendations for correlation with prior imaging or dedicated renal imaging in the future.   Review of Systems: As mentioned in the history of present illness. All other systems reviewed and are negative. Past Medical History:  Diagnosis Date   Cigarette smoker    Coronary artery disease    Diabetes mellitus    type 1  x 50 yrs   Diabetic retinopathy    Dyslipidemia    Hypertension    Peripheral vascular disease    Past Surgical History:  Procedure Laterality Date   ABDOMINAL ANGIOGRAM N/A 05/29/2014   Procedure: ABDOMINAL ANGIOGRAM;  Surgeon: Pamella Pert, MD;  Location: [facility] CATH LAB;  Service: Cardiovascular;  Laterality: N/A;   ABDOMINAL AORTOGRAM W/LOWER EXTREMITY N/A 11/14/2019   Procedure: ABDOMINAL AORTOGRAM W/LOWER EXTREMITY;  Surgeon: Nada Libman, MD;  Location: [facility] INVASIVE CV LAB;  Service: Cardiovascular;  Laterality: N/A;   AMPUTATION Left 10/25/2015   Procedure: Left Foot 5th Ray Amputation;  Surgeon: Nadara Mustard, MD;  Location: [facility] OR;  Service: Orthopedics;  Laterality: Left;   AMPUTATION Left 12/06/2015   Procedure: AMPUTATION BELOW KNEE;  Surgeon: Nadara Mustard, MD;  Location: [facility] OR;  Service: Orthopedics;  Laterality: Left;   CARDIAC CATHETERIZATION     EF is 55-60% and no wall motion abnormalities (long long time ago)   FEMORAL-POPLITEAL BYPASS  GRAFT Left 10/24/2015   Procedure: BYPASS GRAFT FEMORAL below knee POPLITEAL ARTERY with Left Saphenous Vein;  Surgeon: Nada Libman, MD;  Location: [facility] OR;  Service: Vascular;  Laterality: Left;   FEMORAL-POPLITEAL BYPASS GRAFT Right 11/15/2019   Procedure: BYPASS GRAFT FEMORAL-POPLITEAL ARTERY using Right Leg Greater Saphenous Vein;  Surgeon: Nada Libman, MD;  Location:  [facility] OR;  Service: Vascular;  Laterality: Right;   FRACTURE SURGERY     left arm "many yrs ago"   LOWER EXTREMITY ANGIOGRAM N/A 01/23/2014   Procedure: LOWER EXTREMITY ANGIOGRAM;  Surgeon: Pamella Pert, MD;  Location: [facility] CATH LAB;  Service: Cardiovascular;  Laterality: N/A;   LOWER EXTREMITY ANGIOGRAM N/A 07/31/2014   Procedure: LOWER EXTREMITY ANGIOGRAM;  Surgeon: Pamella Pert, MD;  Location: [facility] CATH LAB;  Service: Cardiovascular;  Laterality: N/A;   LOWER EXTREMITY ANGIOGRAM Left 10/11/2015   Procedure: Lower Extremity Angiogram;  Surgeon: Nada Libman, MD;  Location: [facility] INVASIVE CV LAB;  Service: Cardiovascular;  Laterality: Left;   PERIPHERAL VASCULAR BALLOON ANGIOPLASTY  11/14/2019   Procedure: PERIPHERAL VASCULAR BALLOON ANGIOPLASTY;  Surgeon: Nada Libman, MD;  Location: [facility] INVASIVE CV LAB;  Service: Cardiovascular;;   PERIPHERAL VASCULAR CATHETERIZATION Left 10/11/2015   Procedure: Peripheral Vascular Balloon Angioplasty;  Surgeon: Nada Libman, MD;  Location: [facility] INVASIVE CV LAB;  Service: Cardiovascular;  Laterality: Left;  sfa failed unable to cross occluded sfa   PERIPHERAL VASCULAR CATHETERIZATION N/A 10/11/2015   Procedure: Abdominal Aortogram;  Surgeon: Nada Libman, MD;  Location: [facility] INVASIVE CV LAB;  Service: Cardiovascular;  Laterality: N/A;   STUMP REVISION Left 03/02/2016   Procedure: Revision Left Below Knee Amputation;  Surgeon: Nadara Mustard, MD;  Location: [facility] OR;  Service: Orthopedics;  Laterality: Left;   Social History:  reports that he has been smoking cigarettes. He has a 15.00 pack-year smoking history. He has never used smokeless tobacco. He reports current alcohol use. He reports that he does not use drugs.  Allergies  Allergen Reactions   Simvastatin Other (See Comments)    MYALGIAS, MUSCLE WEAKNESS     Family History  Problem Relation Age of Onset   Coronary artery disease Mother    Other Father        Declined after a fall    Prior to  Admission medications   Medication Sig Start Date End Date Taking? Authorizing Provider  acetaminophen (TYLENOL) 500 MG tablet Take 1,000 mg by mouth 2 (two) times daily as needed (pain).     [provider]  albuterol (VENTOLIN HFA) 108 (90 Base) MCG/ACT inhaler Inhale 1-2 puffs into the lungs every 6 (six) hours as needed for wheezing or shortness of breath. 11/27/22   Sloan Leiter, DO  aspirin EC 81 MG tablet Take 243 mg by mouth daily.     [provider]  baclofen (LIORESAL) 20 MG tablet Take 20 mg by mouth daily as needed (for leg cramps). Reported on 12/05/2015 09/09/15   [provider]  calcium carbonate (TUMS - DOSED IN MG ELEMENTAL CALCIUM) 500 MG chewable tablet Chew 2 tablets by mouth every 6 (six) hours as needed for indigestion or heartburn.    [provider]  folic acid (FOLVITE) 800 MCG tablet Take 1,600 mcg by mouth daily.     [provider]  HUMALOG 100 UNIT/ML injection PUMP 01/09/19   [provider]  metoprolol succinate (TOPROL-XL) 25 MG 24 hr tablet Take 1 tablet (25 mg total) by mouth  daily. 02/14/13   Nahser, Deloris Ping, MD  Multiple Vitamins-Minerals (MULTIVITAMIN GUMMIES ADULT PO) Take 2 tablets by mouth daily.     [provider]  [facility] ULTRA test strip  12/31/19   [provider]  ramipril (ALTACE) 10 MG tablet Take 10 mg by mouth daily.      [provider]  rosuvastatin (CRESTOR) 10 MG tablet TAKE 1 TABLET BY MOUTH  DAILY 06/24/20   Rayford Halsted, [place]    Physical Exam: Vitals:   12/09/22 0515 12/09/22 0518 12/09/22 0828 12/09/22 1000  BP: (!) 158/78  (!) 132/57 (!) 145/65  Pulse: (!) 107  (!) 104 96  Resp: (!) 23  (!) 21   Temp: (!) 97.4 F (36.3 C)  97.9 F (36.6 C)   TempSrc: Oral     SpO2: 95%  98% 96%  Weight:  68 kg    Height:   (1.803 m)     Physical Exam Vitals and nursing note reviewed.  Constitutional:      General: He is awake. He is not in acute  distress.    Appearance: He is well-developed.     Interventions: Nasal cannula in place.  HENT:     Head: Normocephalic.     Nose: No rhinorrhea.     Mouth/Throat:     Mouth: Mucous membranes are moist.     Pharynx: No oropharyngeal exudate.  Eyes:     General: No scleral icterus.    Pupils: Pupils are equal, round, and reactive to light.  Neck:     Vascular: No JVD.  Cardiovascular:     Rate and Rhythm: Normal rate and regular rhythm.     Heart sounds: S1 normal and S2 normal.  Pulmonary:     Effort: Pulmonary effort is normal. Tachypnea present. No accessory muscle usage.     Breath sounds: Stridor present. Decreased breath sounds, wheezing and rhonchi present.  Abdominal:     General: Bowel sounds are normal. There is no distension.     Palpations: Abdomen is soft.     Tenderness: There is no abdominal tenderness. There is no right CVA tenderness, left CVA tenderness, guarding or rebound.  Musculoskeletal:     Cervical back: Neck supple.     Right lower leg: No edema.     Left lower leg: No edema.  Skin:    General: Skin is warm and dry.  Neurological:     General: No focal deficit present.     Mental Status: He is alert and oriented to person, place, and time.  Psychiatric:        Mood and Affect: Mood normal.        Behavior: Behavior normal. Behavior is cooperative.    Data Reviewed:  Results are pending, will review when available. EKG today: Vent. rate 105 BPM PR interval 145 ms QRS duration 83 ms QT/QTcB 319/422 ms P-R-T axes 83 70 33 Sinus tachycardia Biatrial enlargement Borderline repolarization abnormality  11/27/2022 echocardiogram IMPRESSIONS:   1. Left ventricular ejection fraction, by estimation, is >75%. The left  ventricle has hyperdynamic function. The left ventricle has no regional  wall motion abnormalities. Left ventricular diastolic parameters were  normal.   2. Right ventricular systolic function is normal. The right ventricular   size is normal.   3. The mitral valve is degenerative. Trivial mitral valve regurgitation.  No evidence of mitral stenosis.   4. The aortic valve was not well visualized. Aortic valve regurgitation  is not  visualized. No aortic stenosis is present.   Assessment and Plan: Principal Problem:   COPD with acute exacerbation Observation/telemetry Continue supplemental oxygen. Dexamethasone 10 mg IVP x1 given in ED.. Followed by prednisone 40 mg p.o. daily in a.m. Scheduled and as needed bronchodilators. Follow-up CBC and chemistry in the morning.  ENT will be evaluating at [facility] for airway direct visualization.  Active Problems:   Tobacco user Last smoked 2 days ago. Declined nicotine replacement therapy. The patient needs tobacco cessation.    Weight loss, unintentional Secondary to emphysema. ENT will perform directly subluxation of her airways.    CAD (coronary artery disease) No anginal symptoms. Good function on echocardiogram 12 days ago. Continue ACE, aspirin, statin and beta-blocker. Follow-up with cardiology for further workup in the future    Hyperlipidemia Continue rosuvastatin 10 mg p.o. daily.    Diabetes mellitus with peripheral artery disease Carbohydrate modified diet. Check hemoglobin A1c. CBG monitoring with RI SS.    Below-knee amputation of left lower extremity Supportive care.    Adrenal adenoma, left Advised to get imaging follow-up. Will need appointment with PCP.     Advance Care Planning:   Code Status: Full Code   Consults:   Family Communication: His male partner was at bedside.  Severity of Illness: The appropriate patient status for this patient is OBSERVATION. Observation status is judged to be reasonable and necessary in order to provide the required intensity of service to ensure the patient's safety. The patient's presenting symptoms, physical exam findings, and initial radiographic and laboratory data in the context of their  medical condition is felt to place them at decreased risk for further clinical deterioration. Furthermore, it is anticipated that the patient will be medically stable for discharge from the hospital within 2 midnights of admission.   Author: Bobette Mo, MD 12/09/2022 10:55 AM  For on call review [email or web address].   This document was prepared using Dragon voice recognition software and may contain some unintended transcription errors.

## 2022-12-09 NOTE — ED Notes (Signed)
Prior to insulin administration, pt dosed himself with his own insulin pump.

## 2022-12-10 ENCOUNTER — Other Ambulatory Visit: Payer: Self-pay

## 2022-12-10 ENCOUNTER — Encounter (HOSPITAL_COMMUNITY): Payer: Self-pay | Admitting: Family Medicine

## 2022-12-10 ENCOUNTER — Observation Stay (HOSPITAL_COMMUNITY): Payer: Medicare Other

## 2022-12-10 ENCOUNTER — Encounter (HOSPITAL_COMMUNITY)
Admission: EM | Disposition: A | Discharge status: Home / Self Care | Payer: Self-pay | Source: Home / Self Care | Attending: Internal Medicine

## 2022-12-10 ENCOUNTER — Ambulatory Visit: Payer: Self-pay | Admitting: Otolaryngology

## 2022-12-10 ENCOUNTER — Inpatient Hospital Stay (HOSPITAL_COMMUNITY): Payer: Medicare Other | Admitting: Registered Nurse

## 2022-12-10 ENCOUNTER — Inpatient Hospital Stay (HOSPITAL_COMMUNITY): Payer: Medicare Other

## 2022-12-10 DIAGNOSIS — Z89512 Acquired absence of left leg below knee: Secondary | ICD-10-CM | POA: Diagnosis not present

## 2022-12-10 DIAGNOSIS — I2510 Atherosclerotic heart disease of native coronary artery without angina pectoris: Secondary | ICD-10-CM | POA: Diagnosis not present

## 2022-12-10 DIAGNOSIS — D649 Anemia, unspecified: Secondary | ICD-10-CM | POA: Diagnosis present

## 2022-12-10 DIAGNOSIS — C329 Malignant neoplasm of larynx, unspecified: Secondary | ICD-10-CM | POA: Diagnosis not present

## 2022-12-10 DIAGNOSIS — J95811 Postprocedural pneumothorax: Secondary | ICD-10-CM | POA: Diagnosis not present

## 2022-12-10 DIAGNOSIS — E785 Hyperlipidemia, unspecified: Secondary | ICD-10-CM | POA: Diagnosis present

## 2022-12-10 DIAGNOSIS — Z79899 Other long term (current) drug therapy: Secondary | ICD-10-CM | POA: Diagnosis not present

## 2022-12-10 DIAGNOSIS — Z8249 Family history of ischemic heart disease and other diseases of the circulatory system: Secondary | ICD-10-CM | POA: Diagnosis not present

## 2022-12-10 DIAGNOSIS — Z681 Body mass index (BMI) 19 or less, adult: Secondary | ICD-10-CM | POA: Diagnosis not present

## 2022-12-10 DIAGNOSIS — S88112S Complete traumatic amputation at level between knee and ankle, left lower leg, sequela: Secondary | ICD-10-CM | POA: Diagnosis not present

## 2022-12-10 DIAGNOSIS — E1065 Type 1 diabetes mellitus with hyperglycemia: Secondary | ICD-10-CM | POA: Diagnosis present

## 2022-12-10 DIAGNOSIS — E871 Hypo-osmolality and hyponatremia: Secondary | ICD-10-CM | POA: Insufficient documentation

## 2022-12-10 DIAGNOSIS — Z1152 Encounter for screening for COVID-19: Secondary | ICD-10-CM | POA: Diagnosis not present

## 2022-12-10 DIAGNOSIS — I10 Essential (primary) hypertension: Secondary | ICD-10-CM | POA: Diagnosis not present

## 2022-12-10 DIAGNOSIS — D3502 Benign neoplasm of left adrenal gland: Secondary | ICD-10-CM

## 2022-12-10 DIAGNOSIS — I739 Peripheral vascular disease, unspecified: Secondary | ICD-10-CM | POA: Diagnosis not present

## 2022-12-10 DIAGNOSIS — E7800 Pure hypercholesterolemia, unspecified: Secondary | ICD-10-CM

## 2022-12-10 DIAGNOSIS — L89892 Pressure ulcer of other site, stage 2: Secondary | ICD-10-CM | POA: Diagnosis present

## 2022-12-10 DIAGNOSIS — Z720 Tobacco use: Secondary | ICD-10-CM

## 2022-12-10 DIAGNOSIS — R0602 Shortness of breath: Secondary | ICD-10-CM | POA: Diagnosis present

## 2022-12-10 DIAGNOSIS — E10319 Type 1 diabetes mellitus with unspecified diabetic retinopathy without macular edema: Secondary | ICD-10-CM | POA: Diagnosis present

## 2022-12-10 DIAGNOSIS — R0600 Dyspnea, unspecified: Secondary | ICD-10-CM | POA: Diagnosis not present

## 2022-12-10 DIAGNOSIS — J9601 Acute respiratory failure with hypoxia: Secondary | ICD-10-CM | POA: Diagnosis present

## 2022-12-10 DIAGNOSIS — J387 Other diseases of larynx: Secondary | ICD-10-CM | POA: Diagnosis present

## 2022-12-10 DIAGNOSIS — Z930 Tracheostomy status: Secondary | ICD-10-CM | POA: Diagnosis not present

## 2022-12-10 DIAGNOSIS — E119 Type 2 diabetes mellitus without complications: Secondary | ICD-10-CM

## 2022-12-10 DIAGNOSIS — F17210 Nicotine dependence, cigarettes, uncomplicated: Secondary | ICD-10-CM | POA: Diagnosis not present

## 2022-12-10 DIAGNOSIS — E43 Unspecified severe protein-calorie malnutrition: Secondary | ICD-10-CM | POA: Diagnosis present

## 2022-12-10 DIAGNOSIS — E1151 Type 2 diabetes mellitus with diabetic peripheral angiopathy without gangrene: Secondary | ICD-10-CM | POA: Diagnosis not present

## 2022-12-10 DIAGNOSIS — Z7982 Long term (current) use of aspirin: Secondary | ICD-10-CM | POA: Diagnosis not present

## 2022-12-10 DIAGNOSIS — Z9641 Presence of insulin pump (external) (internal): Secondary | ICD-10-CM | POA: Diagnosis present

## 2022-12-10 DIAGNOSIS — E1051 Type 1 diabetes mellitus with diabetic peripheral angiopathy without gangrene: Secondary | ICD-10-CM | POA: Diagnosis present

## 2022-12-10 DIAGNOSIS — J982 Interstitial emphysema: Secondary | ICD-10-CM | POA: Diagnosis present

## 2022-12-10 HISTORY — PX: DIRECT LARYNGOSCOPY: SHX5326

## 2022-12-10 HISTORY — PX: TRACHEOSTOMY TUBE PLACEMENT: SHX814

## 2022-12-10 LAB — COMPREHENSIVE METABOLIC PANEL
ALT: 24 U/L (ref 0–44)
AST: 22 U/L (ref 15–41)
Albumin: 3.1 g/dL — ABNORMAL LOW (ref 3.5–5.0)
Alkaline Phosphatase: 70 U/L (ref 38–126)
Anion gap: 7 (ref 5–15)
BUN: 24 mg/dL — ABNORMAL HIGH (ref 8–23)
CO2: 26 mmol/L (ref 22–32)
Calcium: 8.9 mg/dL (ref 8.9–10.3)
Chloride: 99 mmol/L (ref 98–111)
Creatinine, Ser: 0.89 mg/dL (ref 0.61–1.24)
GFR, Estimated: >60 mL/min (ref >60)
Glucose, Bld: 301 mg/dL — ABNORMAL HIGH (ref 70–99)
Potassium: 4.5 mmol/L (ref 3.5–5.1)
Sodium: 132 mmol/L — ABNORMAL LOW (ref 135–145)
Total Bilirubin: 0.6 mg/dL (ref 0.3–1.2)
Total Protein: 5.8 g/dL — ABNORMAL LOW (ref 6.5–8.1)

## 2022-12-10 LAB — GLUCOSE, CAPILLARY
Glucose-Capillary: 276 mg/dL — ABNORMAL HIGH (ref 70–99)
Glucose-Capillary: 207 mg/dL — ABNORMAL HIGH (ref 70–99)
Glucose-Capillary: 136 mg/dL — ABNORMAL HIGH (ref 70–99)
Glucose-Capillary: 184 mg/dL — ABNORMAL HIGH (ref 70–99)
Glucose-Capillary: 184 mg/dL — ABNORMAL HIGH (ref 70–99)
Glucose-Capillary: 197 mg/dL — ABNORMAL HIGH (ref 70–99)
Glucose-Capillary: 190 mg/dL — ABNORMAL HIGH (ref 70–99)
Glucose-Capillary: 156 mg/dL — ABNORMAL HIGH (ref 70–99)

## 2022-12-10 LAB — CBC
HCT: 33.1 % — ABNORMAL LOW (ref 39.0–52.0)
Hemoglobin: 12 g/dL — ABNORMAL LOW (ref 13.0–17.0)
MCH: 33.1 pg (ref 26.0–34.0)
MCHC: 36.3 g/dL — ABNORMAL HIGH (ref 30.0–36.0)
MCV: 91.4 fL (ref 80.0–100.0)
Platelets: 343 10*3/uL (ref 150–400)
RBC: 3.62 MIL/uL — ABNORMAL LOW (ref 4.22–5.81)
RDW: 13.5 % (ref 11.5–15.5)
WBC: 9.4 10*3/uL (ref 4.0–10.5)
nRBC: 0 % (ref 0.0–0.2)

## 2022-12-10 LAB — SARS CORONAVIRUS 2 BY RT PCR: SARS Coronavirus 2 by RT PCR: NEGATIVE

## 2022-12-10 LAB — MRSA NEXT GEN BY PCR, NASAL: MRSA by PCR Next Gen: NOT DETECTED

## 2022-12-10 LAB — HIV ANTIBODY (ROUTINE TESTING W REFLEX): HIV Screen 4th Generation wRfx: NONREACTIVE

## 2022-12-10 SURGERY — CREATION, TRACHEOSTOMY
Anesthesia: General

## 2022-12-10 MED ORDER — ROCURONIUM BROMIDE 10 MG/ML (PF) SYRINGE
PREFILLED_SYRINGE | INTRAVENOUS | Status: DC | PRN
  Administered 2022-12-10: 30 mg via INTRAVENOUS

## 2022-12-10 MED ORDER — LIDOCAINE-EPINEPHRINE 1 %-1:100000 IJ SOLN
INTRAMUSCULAR | Status: DC | PRN
  Administered 2022-12-10: 4.5 mL

## 2022-12-10 MED ORDER — EPINEPHRINE HCL (NASAL) 0.1 % NA SOLN
NASAL | Status: AC
  Filled 2022-12-10: qty 30

## 2022-12-10 MED ORDER — PROPOFOL 10 MG/ML IV BOLUS
INTRAVENOUS | Status: DC | PRN
  Administered 2022-12-10: 30 mg via INTRAVENOUS

## 2022-12-10 MED ORDER — ONDANSETRON HCL 4 MG/2ML IJ SOLN
INTRAMUSCULAR | Status: AC
  Filled 2022-12-10: qty 2

## 2022-12-10 MED ORDER — PROPOFOL 10 MG/ML IV BOLUS
INTRAVENOUS | Status: AC
  Filled 2022-12-10: qty 20

## 2022-12-10 MED ORDER — EPINEPHRINE HCL (NASAL) 0.1 % NA SOLN
NASAL | Status: DC | PRN
  Administered 2022-12-10: 10 mL via TOPICAL

## 2022-12-10 MED ORDER — ROCURONIUM BROMIDE 100 MG/10ML IV SOLN: INTRAVENOUS | Status: DC | PRN

## 2022-12-10 MED ORDER — ONDANSETRON HCL 4 MG/2ML IJ SOLN
INTRAMUSCULAR | Status: DC | PRN
  Administered 2022-12-10: 4 mg via INTRAVENOUS

## 2022-12-10 MED ORDER — LACTATED RINGERS IV SOLN: INTRAVENOUS | Status: DC | PRN

## 2022-12-10 MED ORDER — FENTANYL CITRATE PF 50 MCG/ML IJ SOSY
50.0000 ug | PREFILLED_SYRINGE | INTRAMUSCULAR | Status: DC | PRN
  Administered 2022-12-10 – 2022-12-11 (×2): 50 ug via INTRAVENOUS
  Filled 2022-12-10: qty 1

## 2022-12-10 MED ORDER — SUGAMMADEX SODIUM 200 MG/2ML IV SOLN
INTRAVENOUS | Status: DC | PRN
  Administered 2022-12-10: 120 mg via INTRAVENOUS

## 2022-12-10 MED ORDER — DEXAMETHASONE SODIUM PHOSPHATE 10 MG/ML IJ SOLN
INTRAMUSCULAR | Status: DC | PRN
  Administered 2022-12-10: 10 mg via INTRAVENOUS

## 2022-12-10 MED ORDER — 0.9 % SODIUM CHLORIDE (POUR BTL) OPTIME
TOPICAL | Status: DC | PRN
  Administered 2022-12-10: 1000 mL

## 2022-12-10 MED ORDER — LIDOCAINE-EPINEPHRINE 1 %-1:100000 IJ SOLN
INTRAMUSCULAR | Status: AC
  Filled 2022-12-10: qty 1

## 2022-12-10 MED ORDER — FENTANYL CITRATE PF 50 MCG/ML IJ SOSY
25.0000 ug | PREFILLED_SYRINGE | INTRAMUSCULAR | Status: DC | PRN
  Administered 2022-12-10 (×2): 25 ug via INTRAVENOUS
  Filled 2022-12-10 (×2): qty 1

## 2022-12-10 MED ORDER — FENTANYL CITRATE PF 50 MCG/ML IJ SOSY
100.0000 ug | PREFILLED_SYRINGE | INTRAMUSCULAR | Status: DC | PRN
  Filled 2022-12-10: qty 2

## 2022-12-10 MED ORDER — FENTANYL CITRATE (PF) 250 MCG/5ML IJ SOLN
INTRAMUSCULAR | Status: AC
  Filled 2022-12-10: qty 5

## 2022-12-10 MED ORDER — DEXAMETHASONE SODIUM PHOSPHATE 10 MG/ML IJ SOLN
INTRAMUSCULAR | Status: AC
  Filled 2022-12-10: qty 1

## 2022-12-10 MED ORDER — IOHEXOL 350 MG/ML SOLN
75.0000 mL | Freq: Once | INTRAVENOUS | Status: AC | PRN
  Administered 2022-12-10: 75 mL via INTRAVENOUS

## 2022-12-10 MED ORDER — DEXAMETHASONE SODIUM PHOSPHATE 10 MG/ML IJ SOLN
10.0000 mg | Freq: Two times a day (BID) | INTRAMUSCULAR | Status: DC
  Administered 2022-12-10: 10 mg via INTRAVENOUS
  Filled 2022-12-10 (×2): qty 1

## 2022-12-10 MED ORDER — CHLORHEXIDINE GLUCONATE CLOTH 2 % EX PADS
6.0000 | MEDICATED_PAD | Freq: Every day | CUTANEOUS | Status: DC
  Administered 2022-12-10 – 2022-12-17 (×7): 6 via TOPICAL

## 2022-12-10 MED ORDER — FENTANYL CITRATE (PF) 250 MCG/5ML IJ SOLN
INTRAMUSCULAR | Status: DC | PRN
  Administered 2022-12-10: 25 ug via INTRAVENOUS

## 2022-12-10 MED ORDER — PHENYLEPHRINE 80 MCG/ML (10ML) SYRINGE FOR IV PUSH (FOR BLOOD PRESSURE SUPPORT)
PREFILLED_SYRINGE | INTRAVENOUS | Status: DC | PRN
  Administered 2022-12-10: 160 ug via INTRAVENOUS

## 2022-12-10 SURGICAL SUPPLY — 44 items
APL SKNCLS STERI-STRIP NONHPOA (GAUZE/BANDAGES/DRESSINGS)
BAG COUNTER SPONGE SURGICOUNT (BAG) ×2 IMPLANT
BAG SPNG CNTER NS LX DISP (BAG) ×1
BENZOIN TINCTURE PRP APPL 2/3 (GAUZE/BANDAGES/DRESSINGS) IMPLANT
BLADE CLIPPER SURG (BLADE) IMPLANT
CANISTER SUCT 3000ML PPV (MISCELLANEOUS) ×2 IMPLANT
CLEANER TIP ELECTROSURG 2X2 (MISCELLANEOUS) ×2 IMPLANT
COVER BACK TABLE 60X90IN (DRAPES) ×2 IMPLANT
COVER MAYO STAND STRL (DRAPES) ×2 IMPLANT
COVER SURGICAL LIGHT HANDLE (MISCELLANEOUS) ×2 IMPLANT
DRAPE HALF SHEET 40X57 (DRAPES) ×2 IMPLANT
ELECT COATED BLADE 2.86 ST (ELECTRODE) ×2 IMPLANT
ELECT REM PT RETURN 9FT ADLT (ELECTROSURGICAL) ×1
ELECTRODE REM PT RTRN 9FT ADLT (ELECTROSURGICAL) ×2 IMPLANT
GAUZE 4X4 16PLY ~~LOC~~+RFID DBL (SPONGE) ×2 IMPLANT
GAUZE SPONGE 4X4 12PLY STRL (GAUZE/BANDAGES/DRESSINGS) IMPLANT
GLOVE ECLIPSE 7.5 STRL STRAW (GLOVE) ×2 IMPLANT
GOWN STRL REUS W/ TWL LRG LVL3 (GOWN DISPOSABLE) ×4 IMPLANT
GOWN STRL REUS W/TWL LRG LVL3 (GOWN DISPOSABLE) ×2
GUARD TEETH (MISCELLANEOUS) IMPLANT
KIT BASIN OR (CUSTOM PROCEDURE TRAY) ×2 IMPLANT
KIT TURNOVER KIT B (KITS) ×2 IMPLANT
NDL PRECISIONGLIDE 27X1.5 (NEEDLE) ×2 IMPLANT
NEEDLE PRECISIONGLIDE 27X1.5 (NEEDLE) ×1 IMPLANT
NS IRRIG 1000ML POUR BTL (IV SOLUTION) ×2 IMPLANT
PAD ARMBOARD 7.5X6 YLW CONV (MISCELLANEOUS) ×4 IMPLANT
PATTIES SURGICAL .5 X3 (DISPOSABLE) IMPLANT
PENCIL FOOT CONTROL (ELECTRODE) ×2 IMPLANT
SOL ANTI FOG 6CC (MISCELLANEOUS) IMPLANT
SPIKE FLUID TRANSFER (MISCELLANEOUS) ×2 IMPLANT
SUT CHROMIC 2 0 SH (SUTURE) ×2 IMPLANT
SUT ETHILON 3 0 PS 1 (SUTURE) ×2 IMPLANT
SUT SILK 4 0 (SUTURE) ×1
SUT SILK 4 0 TIE 10X30 (SUTURE) ×2 IMPLANT
SUT SILK 4-0 18XBRD TIE 12 (SUTURE) ×2 IMPLANT
SYR 20ML LL LF (SYRINGE) ×2 IMPLANT
SYR CONTROL 10ML LL (SYRINGE) IMPLANT
TOWEL GREEN STERILE (TOWEL DISPOSABLE) ×2 IMPLANT
TOWEL GREEN STERILE FF (TOWEL DISPOSABLE) ×2 IMPLANT
TRAY ENT MC OR (CUSTOM PROCEDURE TRAY) ×2 IMPLANT
TUBE CONNECTING 12X1/4 (SUCTIONS) ×2 IMPLANT
TUBE TRACH  6.0 CUFF FLEX (MISCELLANEOUS) ×1
TUBE TRACH 6.0 CUFF FLEX (MISCELLANEOUS) IMPLANT
WATER STERILE IRR 1000ML POUR (IV SOLUTION) ×2 IMPLANT

## 2022-12-10 NOTE — Progress Notes (Signed)
Asked to see patient for further evaluation and treatment of a laryngeal mass.  He had symptoms now for about 6 months but the past month has been especially bad as far as difficulty breathing, and he is lost about 30 pounds.  He has chronic diabetes and multiple complications from that including vascular disease and left lower leg amputation.  He does not have any renal insufficiency that he knows of.  He has a history of cigarette smoking.  I reviewed the consultation note from Dr. Jearld Fenton.  Listening to his breathing he has definite inspiratory stridor.  Based on that I would recommend that we do tracheostomy under local anesthesia followed by direct laryngoscopy with biopsy.  We will complete staging of the tumor once we have the biopsy and additional imaging.  We had a brief discussion about treatment options which depending on the staging could involve either radiotherapy, chemotherapy with radiotherapy, surgical treatment with laryngectomy.  He understands all of this and is agreeable.

## 2022-12-10 NOTE — Assessment & Plan Note (Addendum)
Coronary artery disease - Continue home aspirin, metoprolol, ramipiril, Crestor

## 2022-12-10 NOTE — Assessment & Plan Note (Signed)
Mild, asymptomatic - Continue home BP meds

## 2022-12-10 NOTE — Hospital Course (Signed)
[name] is a 72 y.o. M with hx smoking, PVD s/p left BKA in 2017, right fem-pop bypass in 2021, CAD never PCI, HLD, HTN, and DM on insulin pump who presented with vocal cord mass.  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

## 2022-12-10 NOTE — Progress Notes (Signed)
  Progress Note   Patient: [name] ZOX:[id number] DOB: 1950-08-30 DOA: 12/09/2022     0 DOS: the patient was seen and examined on 12/10/2022 at 1:02PM      Brief hospital course: Mr Glick is a 72 y.o. M with hx smoking, PVD s/p left BKA in 2017, right fem-pop bypass in 2021, CAD never PCI, HLD, HTN, and DM on insulin pump who presented with vocal cord mass.  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  Patient with significant stridor, hard to talk more than a few sentences.  Evaluated by ENT earlier today who recommend tracheostomy.  Nursing believe this is planned for tomorrow  - Stop steroids - Obtain CT neck with contrast - Appreciate ENT expertise - To OR per ENT for tracheostomy and biopsy    Hyponatremia Mild, asymptomatic - Continue home BP meds  Essential hypertension BP controlled - Continue metop, ramip  Adrenal adenoma, left - Outpatient follow up  Tobacco user    Below-knee amputation of left lower extremity    Diabetes mellitus with peripheral artery disease and hyperglycemia Glucoses initially elevated, better more recently - Continue home insulin pump  Hyperlipidemia - Continue Crestor  PVD (peripheral vascular disease) s/p R fem-pop bypass and L BKA Coronary artery disease - Continue home aspirin, metoprolol, ramipiril, Crestor  Unintentional weight loss 30 lbs in 1 month        Subjective: Patient is fairly dyspneic and stridorous, after just a few sentences he gets out of breath and has to take a labored deep breath.  No fever, confusion.  No hemoptysis.     Physical  Exam: BP 119/64 (BP Location: Left Arm)   Pulse 73   Temp (!) 97.5 F (36.4 C) (Oral)   Resp 15   Ht  (1.803 m)   Wt 58.6 kg   SpO2 97%   BMI 18.02 kg/m   Thin adult male, lying in bed, no acute distress RRR, no murmurs, no peripheral edema Respiratory effort appears labored and stridorous, he has no wheezing or rales at all in the lower lung fields, although significant stridor and difficulty breathing due to his laryngeal narrowing Abdomen soft without tenderness palpation Attention normal, affect appropriate, judgment insight appear normal    Data Reviewed: Comprehensive metabolic panel normal other than sodium slightly low at 132 Hemoglobin A1c 8% CBC normal  Family Communication: None present    Disposition: Status is: Inpatient The patient is significant difficulty breathing, and warrants tracheostomy  ENT plan to do this tomorrow, likely disposition to home when cleared postoperatively by ENT        Author: Alberteen Sam, MD 12/10/2022 1:51 PM  For on call review [email or web address].

## 2022-12-10 NOTE — Assessment & Plan Note (Signed)
-   Outpatient follow-up °

## 2022-12-10 NOTE — Assessment & Plan Note (Signed)
Glucoses initially elevated, better more recently - Continue home insulin pump

## 2022-12-10 NOTE — Assessment & Plan Note (Signed)
Continue Crestor 

## 2022-12-10 NOTE — Progress Notes (Signed)
RT called at bedside by RN. Pt seems to be in respiratory distress with stridor. Pt was placed on BiPAP. Pt states BiPAP helped with WOB. 67M RT given report.

## 2022-12-10 NOTE — Assessment & Plan Note (Addendum)
COPD exacerbation ruled out.  Patient with significant stridor, hard to talk more than a few sentences.  Evaluated by ENT earlier today who recommend tracheostomy.  Nursing believe this is planned for tomorrow  - Stop steroids - Obtain CT neck with contrast - Appreciate ENT expertise - To OR per ENT for tracheostomy and biopsy

## 2022-12-10 NOTE — Anesthesia Preprocedure Evaluation (Addendum)
Anesthesia Evaluation  Patient identified by MRN, date of birth, ID band Patient awake    Reviewed: Allergy & Precautions, NPO status , Patient's Chart, lab work & pertinent test resultsPreop documentation limited or incomplete due to emergent nature of procedure.  Airway Mallampati: Unable to assess       Dental  (+) Teeth Intact, Poor Dentition   Pulmonary Current Smoker and Patient abstained from smoking.    + decreased breath sounds      Cardiovascular hypertension, + CAD and + Peripheral Vascular Disease   Rhythm:Regular Rate:Normal  Echo:  1. Left ventricular ejection fraction, by estimation, is >75%. The left  ventricle has hyperdynamic function. The left ventricle has no regional  wall motion abnormalities. Left ventricular diastolic parameters were  normal.   2. Right ventricular systolic function is normal. The right ventricular  size is normal.   3. The mitral valve is degenerative. Trivial mitral valve regurgitation.  No evidence of mitral stenosis.   4. The aortic valve was not well visualized. Aortic valve regurgitation  is not visualized. No aortic stenosis is present.     Neuro/Psych  negative psych ROS   GI/Hepatic negative GI ROS, Neg liver ROS,,,  Endo/Other  diabetes    Renal/GU negative Renal ROS     Musculoskeletal negative musculoskeletal ROS (+)    Abdominal   Peds  Hematology negative hematology ROS (+)   Anesthesia Other Findings   Reproductive/Obstetrics                             Anesthesia Physical Anesthesia Plan  ASA: 4  Anesthesia Plan: MAC   Post-op Pain Management: Minimal or no pain anticipated   Induction: Intravenous  PONV Risk Score and Plan: 0  Airway Management Planned: Tracheostomy  Additional Equipment: None  Intra-op Plan:   Post-operative Plan:   Informed Consent: I have reviewed the patients History and Physical, chart, labs  and discussed the procedure including the risks, benefits and alternatives for the proposed anesthesia with the patient or authorized representative who has indicated his/her understanding and acceptance.       Plan Discussed with: CRNA  Anesthesia Plan Comments:        Anesthesia Quick Evaluation

## 2022-12-10 NOTE — Assessment & Plan Note (Signed)
BP controlled - Continue metop, ramip

## 2022-12-10 NOTE — Progress Notes (Signed)
Called back to bedside for respiratory distress.  Patient much more stridorous, struggling to breathe.  Worse with talking.    SpO2 normal on 2L [place].  Discussed with Dr. Pollyann Kennedy, aware of distress, recommended transfer to ICU.  Discussed with CCM, they will come to evaluate.  Transfer order placed.

## 2022-12-10 NOTE — H&P (Addendum)
NAME:  [name], MRN:  [id number], DOB:  23-May-1951, LOS: 0 ADMISSION DATE:  12/09/2022, CONSULTATION DATE:  12/10/2022 REFERRING MD:  Dr. Joen Laura, CHIEF COMPLAINT:  Shortness of Breath    History of Present Illness:  This is a 72 year old male with a past medical history of emphysema, CAD, Type 1 diabetes, HLD, HTN who presented to the emergency department with concerns of shortness of breath.  Initial vital signs were temperature 97.4 F, pulse 107, respirations 23, BP 158/78 mmHg O2 sat 95% on room air. Initially thought to be a COPD exacerbation. Upon further examination there was concern for upper airway obstruction.   The patient received a continuous albuterol 10+ ipratropium 0.5 mg neb, an albuterol 2.5 mg nebs, DuoNeb, magnesium sulfate 2 g IVPB and 10 mg of dexamethasone IVP. Patient initially admitted to the floor for further evaluation and management for new laryngeal mass. On 12/10/2022 patient had worsening shortness of breath and rapid response was called. Patient required to go onto BiPAP. PCCM consulted for ICU admit for further evaluation and management.    Pertinent  Medical History  Emphysema, CAD, Tye 1 DM, HLD, HTN, PVD  Significant Hospital Events: Including procedures, antibiotic start and stop dates in addition to other pertinent events   04/17: Patient admitted to [facility] for new laryngeal mass 04/18: Patient had a rapid called for worsening shortness of breath. Patient required to go to ICU closer monitoring.   Interim History / Subjective:  Upon my exam, patient is resting in bed sitting on the side. He is on BiPAP and is not able to answer much questions. He notes that he is short of breath and that the BiPAP helped him. He reports that he has been having night sweats but denies any fevers or chills. He states he has been eating and drinking well no concerns.   Objective   Blood pressure 136/73, pulse 85, temperature (!) 97.5 F (36.4 C),  temperature source Oral, resp. rate 20, height 5\' 11"  (1.803 m), weight 58.6 kg, SpO2 100 %.    FiO2 (%):  [40 %] 40 %   Intake/Output Summary (Last 24 hours) at 12/10/2022 1531 Last data filed at 12/10/2022 1452 Gross per 24 hour  Intake --  Output 200 ml  Net -200 ml   Filed Weights   12/09/22 0518 12/09/22 1508  Weight: 68 kg 58.6 kg    Examination: General: Resting in bed in some distress    HENT: Normocephalic atraumatic  Lungs: Upper airway stridor on BiPAP  Cardiovascular: RRR, no murmurs rubs or gallops Abdomen: Soft, non distended with normoactive bowel sounds  Extremities: able to move freely  Neuro: able to follow all commands   Resolved Hospital Problem list     Assessment & Plan:  This is a 72 year old male with past medical history of hypertension, COPD, hyperlipidemia who presents emergency department with concerns of shortness of breath.  Patient found to have laryngeal mass, and admitted for further evaluation management.  On second day of admission, patient was found to have worsening shortness of breath requiring BiPAP.  Patient mated to [facility] for closer monitoring.  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  Imaging revealed patient had laryngeal mass.  Mass is suspected cancer given smoking history as well as 30 pound weight loss in the last month.  Otolaryngology following patient, who was planning  on taking the patient to the OR tomorrow for tracheostomy.  Unfortunately, patient decompensated on the floor, and PCCM was called for further evaluation and management.  Upon arrival, patient is on BiPAP, and patient had upper airway stridor appreciated.  On exam, patient did look in acute distress.  His oxygen saturations remained in the 90s on BiPAP.  Given concern for further airway demise, patient was transferred to the ICU  for further evaluation management.  Did reach out to otolaryngology to expedite tracheostomy, which they agreed to do.  Patient will proceed to the OR for tracheostomy. -Continue to monitor respiratory status -Continue on BiPAP until tracheostomy -ENT to take patient to the OR today -Continue dexamethasone 10 mg every 12 hours  #Type 1 diabetes Patient glucose this morning at 301 on BMET.  Most recent glucose has been 184.  Patient does have his own insulin pump.  Will reach out to diabetic coordinator for insulin pump management. -Continue on insulin pump -I do anticipate blood sugars to increase given patient has been taking dexamethasone -Monitor blood sugars closely  #Hypertension Patient is a past medical history of hypertension.  Current medications include metoprolol 25 mg nightly, ramipril 5 mg nightly.  Patient current blood pressure at 136/73.  Will continue with same management. -Continue metoprolol 25 mg nightly -Continue ramipril 5 mg nightly -Continue monitor blood pressure  #Hyperlipidemia Patient past medical history of hyperlipidemia.  Patient currently on Crestor 10 mg daily. Plan continue Crestor 10 mg daily  #Pseudohyponatremia Sodium 132 this morning.  Glucose 301.  Pseudohyponatremia as sodium corrects to 135-137. -Continue to monitor BMP  #Normocytic anemia Patient does have a normocytic anemia.  No concern for active blood loss at this point.  Hemoglobin 12. -Continue to monitor as patient is going to OR  #Left adrenal adenoma -Follow-up outpatient  #PVD status post left BKA #CAD No acute concerns at this time. -Continue aspirin, metoprolol, Orapred, Crestor  Best Practice (right click and "Reselect all SmartList Selections" daily)   Diet/type: NPO DVT prophylaxis: LMWH GI prophylaxis: N/A Lines: N/A Foley:  N/A Code Status:  full code  Labs   CBC: Recent Labs  Lab 12/09/22 0535 12/10/22 0246  WBC 12.0* 9.4  HGB 13.6 12.0*  HCT 39.7 33.1*   MCV 95.4 91.4  PLT 382 343    Basic Metabolic Panel: Recent Labs  Lab 12/09/22 0535 12/10/22 0246  NA 135 132*  K 4.1 4.5  CL 97* 99  CO2 28 26  GLUCOSE 183* 301*  BUN 19 24*  CREATININE 0.79 0.89  CALCIUM 10.0 8.9   GFR: Estimated Creatinine Clearance: 64 mL/min (by C-G formula based on SCr of 0.89 mg/dL). Recent Labs  Lab 12/09/22 0535 12/10/22 0246  WBC 12.0* 9.4    Liver Function Tests: Recent Labs  Lab 12/09/22 0535 12/10/22 0246  AST 28 22  ALT 24 24  ALKPHOS 71 70  BILITOT 0.7 0.6  PROT 7.0 5.8*  ALBUMIN 3.9 3.1*   No results for input(s): "LIPASE", "AMYLASE" in the last 168 hours. No results for input(s): "AMMONIA" in the last 168 hours.  ABG    Component Value Date/Time   HCO3 26.5 12/09/2022 0607   TCO2 28 11/14/2019 1026   O2SAT 81.4 12/09/2022 0607     Coagulation Profile: No results for input(s): "INR", "PROTIME" in the last 168 hours.  Cardiac Enzymes: No results for input(s): "CKTOTAL", "CKMB", "CKMBINDEX", "TROPONINI" in the last 168 hours.  HbA1C: Hgb A1c MFr Bld  Date/Time Value Ref  Range Status  12/09/2022 05:34 AM 8.0 (H) 4.8 - 5.6 % Final    Comment:    (NOTE) Pre diabetes:          5.7%-6.4%  Diabetes:              >6.4%  Glycemic control for   <7.0% adults with diabetes   11/14/2019 03:55 PM 8.0 (H) 4.8 - 5.6 % Final    Comment:    (NOTE) Pre diabetes:          5.7%-6.4% Diabetes:              >6.4% Glycemic control for   <7.0% adults with diabetes     CBG: Recent Labs  Lab 12/09/22 2123 12/10/22 0341 12/10/22 0716 12/10/22 1135 12/10/22 1427  GLUCAP 477* 276* 207* 136* 184*    Review of Systems:   Negative except for what is stated in the HPI  Past Medical History:  He,  has a past medical history of Cigarette smoker, Coronary artery disease, Diabetes mellitus, Diabetic retinopathy, Dyslipidemia, Hypertension, and Peripheral vascular disease.   Surgical History:   Past Surgical History:   Procedure Laterality Date   ABDOMINAL ANGIOGRAM N/A 05/29/2014   Procedure: ABDOMINAL ANGIOGRAM;  Surgeon: Pamella Pert, MD;  Location: [facility] CATH LAB;  Service: Cardiovascular;  Laterality: N/A;   ABDOMINAL AORTOGRAM W/LOWER EXTREMITY N/A 11/14/2019   Procedure: ABDOMINAL AORTOGRAM W/LOWER EXTREMITY;  Surgeon: Nada Libman, MD;  Location: [facility] INVASIVE CV LAB;  Service: Cardiovascular;  Laterality: N/A;   AMPUTATION Left 10/25/2015   Procedure: Left Foot 5th Ray Amputation;  Surgeon: Nadara Mustard, MD;  Location: [facility] OR;  Service: Orthopedics;  Laterality: Left;   AMPUTATION Left 12/06/2015   Procedure: AMPUTATION BELOW KNEE;  Surgeon: Nadara Mustard, MD;  Location: [facility] OR;  Service: Orthopedics;  Laterality: Left;   CARDIAC CATHETERIZATION     EF is 55-60% and no wall motion abnormalities (long long time ago)   FEMORAL-POPLITEAL BYPASS GRAFT Left 10/24/2015   Procedure: BYPASS GRAFT FEMORAL below knee POPLITEAL ARTERY with Left Saphenous Vein;  Surgeon: Nada Libman, MD;  Location: [facility] OR;  Service: Vascular;  Laterality: Left;   FEMORAL-POPLITEAL BYPASS GRAFT Right 11/15/2019   Procedure: BYPASS GRAFT FEMORAL-POPLITEAL ARTERY using Right Leg Greater Saphenous Vein;  Surgeon: Nada Libman, MD;  Location: [facility] OR;  Service: Vascular;  Laterality: Right;   FRACTURE SURGERY     left arm "many yrs ago"   LOWER EXTREMITY ANGIOGRAM N/A 01/23/2014   Procedure: LOWER EXTREMITY ANGIOGRAM;  Surgeon: Pamella Pert, MD;  Location: [name] CATH LAB;  Service: Cardiovascular;  Laterality: N/A;   LOWER EXTREMITY ANGIOGRAM N/A 07/31/2014   Procedure: LOWER EXTREMITY ANGIOGRAM;  Surgeon: Pamella Pert, MD;  Location: [facility] CATH LAB;  Service: Cardiovascular;  Laterality: N/A;   LOWER EXTREMITY ANGIOGRAM Left 10/11/2015   Procedure: Lower Extremity Angiogram;  Surgeon: Nada Libman, MD;  Location: [facility] INVASIVE CV LAB;  Service: Cardiovascular;  Laterality: Left;   PERIPHERAL VASCULAR BALLOON ANGIOPLASTY   11/14/2019   Procedure: PERIPHERAL VASCULAR BALLOON ANGIOPLASTY;  Surgeon: Nada Libman, MD;  Location: [facility] INVASIVE CV LAB;  Service: Cardiovascular;;   PERIPHERAL VASCULAR CATHETERIZATION Left 10/11/2015   Procedure: Peripheral Vascular Balloon Angioplasty;  Surgeon: Nada Libman, MD;  Location: [facility] INVASIVE CV LAB;  Service: Cardiovascular;  Laterality: Left;  sfa failed unable to cross occluded sfa   PERIPHERAL VASCULAR CATHETERIZATION N/A 10/11/2015   Procedure: Abdominal Aortogram;  Surgeon: Faylene Million  Janae Bridgeman, MD;  Location: [facility] INVASIVE CV LAB;  Service: Cardiovascular;  Laterality: N/A;   STUMP REVISION Left 03/02/2016   Procedure: Revision Left Below Knee Amputation;  Surgeon: Nadara Mustard, MD;  Location: [facility] OR;  Service: Orthopedics;  Laterality: Left;     Social History:   reports that he has been smoking cigarettes. He has a 15.00 pack-year smoking history. He has never used smokeless tobacco. He reports current alcohol use. He reports that he does not use drugs.   Family History:  His family history includes Coronary artery disease in his mother; Other in his father.   Allergies Allergies  Allergen Reactions   Simvastatin Other (See Comments)    MYALGIAS, MUSCLE WEAKNESS      Home Medications  Prior to Admission medications   Medication Sig Start Date End Date Taking? Authorizing Provider  albuterol (VENTOLIN HFA) 108 (90 Base) MCG/ACT inhaler Inhale 1-2 puffs into the lungs every 6 (six) hours as needed for wheezing or shortness of breath. Patient taking differently: Inhale 2 puffs into the lungs every 6 (six) hours as needed for wheezing or shortness of breath. 11/27/22  Yes Sloan Leiter, DO  aspirin EC 81 MG tablet Take 325 mg by mouth at bedtime.   Yes [provider]  baclofen (LIORESAL) 20 MG tablet Take 20 mg by mouth daily as needed (for leg cramps). Reported on 12/05/2015 09/09/15  Yes [provider]  calcium carbonate (TUMS - DOSED IN MG ELEMENTAL  CALCIUM) 500 MG chewable tablet Chew 1,000-2,000 mg by mouth as needed for indigestion or heartburn.   Yes [provider]  folic acid (FOLVITE) 800 MCG tablet Take 1,600 mcg by mouth at bedtime.   Yes [provider]  HUMALOG 100 UNIT/ML injection 2.1 mLs See admin instructions. Via PUMP Inject 2.1 ml into the pump when it runs out. Pt is unsure how often he fills his pump or the rate of his doses. 01/09/19  Yes [provider]  ibuprofen (ADVIL) 200 MG tablet Take 400 mg by mouth as needed for moderate pain.   Yes [provider]  metoprolol succinate (TOPROL-XL) 25 MG 24 hr tablet Take 1 tablet (25 mg total) by mouth daily. Patient taking differently: Take 25 mg by mouth at bedtime. 02/14/13  Yes Nahser, Deloris Ping, MD  Multiple Vitamins-Minerals (MULTIVITAMIN GUMMIES ADULT PO) Take 3 tablets by mouth at bedtime.   Yes [provider]  ramipril (ALTACE) 5 MG capsule Take 5 mg by mouth at bedtime.   Yes [provider]  rosuvastatin (CRESTOR) 10 MG tablet TAKE 1 TABLET BY MOUTH  DAILY Patient taking differently: Take 10 mg by mouth at bedtime. 06/24/20  Yes Rayford Halsted, PA-C     Critical care time: 15 Mintues    Modena Slater, DO Internal Medicine Resident PGY-1 Pager: [phone]  Critical care attending attestation note:  Patient seen and examined and relevant ancillary tests reviewed.  I agree with the assessment and plan of care as outlined by Modena Slater, DO.   Synopsis of assessment and plan:  71 year old man with respiratory distress from obstructing laryngeal mass on CT.   On exam, still distressed on BIPAP.   - Called ENT who will take for trach now.   CRITICAL CARE Performed by: Lynnell Catalan   Total critical care time: 35 minutes  Critical care time was exclusive of separately billable procedures and treating other patients.  Critical care was necessary to treat or prevent  imminent or life-threatening  deterioration.  Critical care was time spent personally by me on the following activities: development of treatment plan with patient and/or surrogate as well as nursing, discussions with consultants, evaluation of patient's response to treatment, examination of patient, obtaining history from patient or surrogate, ordering and performing treatments and interventions, ordering and review of laboratory studies, ordering and review of radiographic studies, pulse oximetry, re-evaluation of patient's condition and participation in multidisciplinary rounds.  Lynnell Catalan, MD [facility] ICU Physician [facility]  Pager: [phone] Mobile: [phone] After hours: [phone].  12/10/2022, 5:14 PM

## 2022-12-10 NOTE — Transfer of Care (Signed)
Immediate Anesthesia Transfer of Care Note  Patient: [name]  Procedure(s) Performed: TRACHEOSTOMY UNDER LOCAL DIRECT LARYNGOSCOPY WITH BIOPSY  Patient Location: PACU  Anesthesia Type:General  Level of Consciousness: drowsy  Airway & Oxygen Therapy: Patient Spontanous Breathing and Patient connected to tracheostomy mask oxygen  Post-op Assessment: Report given to RN  Post vital signs: Reviewed and stable  Last Vitals:  Vitals Value Taken Time  BP 180/86 12/10/22 1711  Temp 36.8 C 12/10/22 1711  Pulse 90 12/10/22 1713  Resp 18 12/10/22 1713  SpO2 96 % 12/10/22 1713  Vitals shown include unvalidated device data.  Last Pain:  Vitals:   12/10/22 1600  TempSrc:   PainSc: 0-No pain         Complications: No notable events documented.

## 2022-12-10 NOTE — Op Note (Signed)
12/10/2022 4:54 PM   PATIENT:  [name], 72 y.o. male  PRE-OPERATIVE DIAGNOSIS:  Laryngeal Mass, airway obstruction  POST-OPERATIVE DIAGNOSIS:  Laryngeal Mass, airway obstruction  PROCEDURE:  Procedure(s): TRACHEOSTOMY under local anesthesia Direct laryngoscopy with biopsy of laryngeal mass under general anesthesia  SURGEON:  Surgeon(s): Susy Frizzle, MD  ASSISTANTS: none   ANESTHESIA:   Local, general  EBL: Minimal   DRAINS: none   LOCAL MEDICATIONS USED: 1% Xylocaine with epinephrine  COUNTS CORRECT:  YES  PROCEDURE DETAILS: Patient was taken to the operating room and placed on the operating table in the supine position. A shoulder roll was placed for positioning. The patient was previously on BiPAP. The neck was prepped and draped in a standard fashion.  Local anesthetic was infiltrated into the proposed incision site.  A vertical incision was created just above the sternal notch using electrocautery. The midline fascia was divided. The isthmus of the thyroid was reflected superiorly and the upper trachea was exposed. A tracheotomy was created between the second and third tracheal rings in a horizontal fashion. A lower tracheal flap was created with scissors and the flap was sutured to the cervical skin using 2-0 chromic suture. The orotracheal tube was removed. The # 6 Shiley tracheostomy tube was placed without difficulty and the cuff was inflated. The shield was secured to the neck using a Velcro straps and nylon suture.  At this point the patient was placed under general anesthesia once the airway secured.  Table was turned 90 degrees and a direct laryngoscopy was performed.  The head was draped in a standard fashion.  Maxillary tooth protector was used.  A Jako laryngoscope was used to evaluate the larynx and hypopharynx.  Hypopharynx is clear.  Esophageal introitus was clear.  There is a large multi polypoid type mass obstructing the entire laryngeal airway.  Could not  visualize the cords.  Large biopsy forceps were used to remove 2 of the larger exophytic masses.  This uncovered the endolarynx which revealed a large diffuse mass that seem to be arising from the left cord but appeared to involve both sides of the larynx.  Could not visualize the subglottic larynx.  Multiple biopsies were taken.  These were all sent together for pathologic evaluation.  Topical adrenaline was used on pledgets for hemostasis.  The patient was then transferred back to the intensive care unit in stable condition.  PLAN OF CARE: Transfer to ICU  PATIENT DISPOSITION:  ICU - hemodynamically stable.

## 2022-12-10 NOTE — Inpatient Diabetes Management (Signed)
Inpatient Diabetes Program Recommendations  AACE/ADA: New Consensus Statement on Inpatient Glycemic Control (2015)  Target Ranges:  Prepandial:   less than 140 mg/dL      Peak postprandial:   less than 180 mg/dL (1-2 hours)      Critically ill patients:  140 - 180 mg/dL   Lab Results  Component Value Date   GLUCAP 136 (H) 12/10/2022   HGBA1C 8.0 (H) 12/09/2022    Review of Glycemic Control  Latest Reference Range & Units 12/09/22 21:23 12/10/22 03:41 12/10/22 07:16 12/10/22 11:35  Glucose-Capillary 70 - 99 mg/dL 161 (H) 096 (H) 045 (H) 136 (H)  (H): Data is abnormally high Diabetes history: DM1 x 50 years Outpatient Diabetes medications: Insulin pump - Medtronic Current orders for Inpatient glycemic control: Insulin pump   HgbA1C - 8.0% PCP - Timothy Lasso at [facility]  Inpatient Diabetes Program Recommendations:     Spoke with patient regarding diabetes and insulin pump. Is followed by Dr Timothy Lasso and has had Type 1 DM for over 50 years. Is currently managing with insulin pump at bedside. Has additional supplies but will need additional Novolog in pump at the end of the day on 4/19.  Discussed with patient of the impact of steroids and how this increased current glucose trends. Patient responding appropriately. No questions at this time.   Current insulin pump settings are as follows:  Basal insulin  12A 0.7 units/hour Total daily basal insulin: 16.8 units/24 hours  Carb Coverage 1:15 1 unit for every 15 grams of carbohydrates  Insulin Sensitivity 1:50 1 unit drops blood glucose 50 mg/dl  Target Glucose Goals 4098-1191 120-120 mg/dl   NURSING: Once insulin pump order set is ordered please print off the Patient insulin pump contract and flow sheet. The insulin pump contract should be signed by the patient and then placed in the chart. The patient insulin pump flow sheet will be completed by the patient at the bedside and the RN caring for the patient will use the  patient's flow sheet to document in the [facility]. RN will need to complete the Nursing Insulin Pump Flowsheet at least once a shift. Patient will need to keep extra insulin pump supplies at the bedside at all times.   Thanks, Lujean Rave, MSN, RNC-OB Diabetes Coordinator [phone] (8a-5p)

## 2022-12-10 NOTE — Progress Notes (Addendum)
eLink Physician-Brief Progress Note Patient Name: EDILSON VITAL DOB: 09-29-1950 MRN: [id number]   Date of Service  12/10/2022  HPI/Events of Note  72 year old male that presented from the floor with dyspnea, hoarseness, and a nodule in the right thought to be causing airway obstruction.  Left adrenal nodule with weight loss concerning for ongoing malignancy.  Patient underwent emergent tracheostomy, currently on blow-by oxygen.  He is oriented, complaining of pain and pressure at the level of the neck as well as his left greater than right eye.  Continues to be tender at the time despite fentanyl.  eICU Interventions  Increase dose of fentanyl to 100 mcg every hour as needed  Patient has a secure airway, if he has hypersomnolence or bradypnea, will need to put the patient on the ventilator overnight.  Obtain chest x-ray to rule out pneumothorax.   2212 -currently, the patient has no safe enteral access, I hesitate to have nursing place a feeding tube in the setting of a potential obstructing mass.  For now, hold p.o. meds until this can be readdressed with ENT in the morning.  2256 -small right-sided apical pneumothorax with associated subcutaneous emphysema throughout the neck.  Will have ground team evaluate, but suspect this is normal post operative subcutaneous emphysema.  Continue analgesics.  Will order repeat interval scan in 6 hours.  Intervention Category Intermediate Interventions: Pain - evaluation and management  Desma Wilkowski 12/10/2022, 9:45 PM

## 2022-12-11 ENCOUNTER — Inpatient Hospital Stay (HOSPITAL_COMMUNITY): Payer: Medicare Other

## 2022-12-11 ENCOUNTER — Encounter (HOSPITAL_COMMUNITY): Payer: Self-pay | Admitting: Otolaryngology

## 2022-12-11 DIAGNOSIS — J9601 Acute respiratory failure with hypoxia: Secondary | ICD-10-CM

## 2022-12-11 DIAGNOSIS — J95811 Postprocedural pneumothorax: Secondary | ICD-10-CM

## 2022-12-11 DIAGNOSIS — L8990 Pressure ulcer of unspecified site, unspecified stage: Secondary | ICD-10-CM | POA: Insufficient documentation

## 2022-12-11 DIAGNOSIS — C329 Malignant neoplasm of larynx, unspecified: Secondary | ICD-10-CM | POA: Diagnosis not present

## 2022-12-11 DIAGNOSIS — Z930 Tracheostomy status: Secondary | ICD-10-CM

## 2022-12-11 LAB — CBC WITH DIFFERENTIAL/PLATELET
Abs Immature Granulocytes: 0.09 10*3/uL — ABNORMAL HIGH (ref 0.00–0.07)
Basophils Absolute: 0 10*3/uL (ref 0.0–0.1)
Basophils Relative: 0 %
Eosinophils Absolute: 0 10*3/uL (ref 0.0–0.5)
Eosinophils Relative: 0 %
HCT: 36.6 % — ABNORMAL LOW (ref 39.0–52.0)
Hemoglobin: 12.7 g/dL — ABNORMAL LOW (ref 13.0–17.0)
Immature Granulocytes: 1 %
Lymphocytes Relative: 2 %
Lymphs Abs: 0.5 10*3/uL — ABNORMAL LOW (ref 0.7–4.0)
MCH: 32.6 pg (ref 26.0–34.0)
MCHC: 34.7 g/dL (ref 30.0–36.0)
MCV: 93.8 fL (ref 80.0–100.0)
Monocytes Absolute: 0.6 10*3/uL (ref 0.1–1.0)
Monocytes Relative: 4 %
Neutro Abs: 17.2 10*3/uL — ABNORMAL HIGH (ref 1.7–7.7)
Neutrophils Relative %: 93 %
Platelets: 349 10*3/uL (ref 150–400)
RBC: 3.9 MIL/uL — ABNORMAL LOW (ref 4.22–5.81)
RDW: 13.6 % (ref 11.5–15.5)
WBC: 18.4 10*3/uL — ABNORMAL HIGH (ref 4.0–10.5)
nRBC: 0 % (ref 0.0–0.2)

## 2022-12-11 LAB — GLUCOSE, CAPILLARY
Glucose-Capillary: 143 mg/dL — ABNORMAL HIGH (ref 70–99)
Glucose-Capillary: 145 mg/dL — ABNORMAL HIGH (ref 70–99)
Glucose-Capillary: 143 mg/dL — ABNORMAL HIGH (ref 70–99)
Glucose-Capillary: 143 mg/dL — ABNORMAL HIGH (ref 70–99)
Glucose-Capillary: 137 mg/dL — ABNORMAL HIGH (ref 70–99)

## 2022-12-11 LAB — BASIC METABOLIC PANEL
Anion gap: 10 (ref 5–15)
BUN: 24 mg/dL — ABNORMAL HIGH (ref 8–23)
CO2: 25 mmol/L (ref 22–32)
Calcium: 8.7 mg/dL — ABNORMAL LOW (ref 8.9–10.3)
Chloride: 101 mmol/L (ref 98–111)
Creatinine, Ser: 0.81 mg/dL (ref 0.61–1.24)
GFR, Estimated: >60 mL/min (ref >60)
Glucose, Bld: 150 mg/dL — ABNORMAL HIGH (ref 70–99)
Potassium: 4.8 mmol/L (ref 3.5–5.1)
Sodium: 136 mmol/L (ref 135–145)

## 2022-12-11 MED ORDER — ACETAMINOPHEN 10 MG/ML IV SOLN
1000.0000 mg | Freq: Once | INTRAVENOUS | Status: AC
  Administered 2022-12-11: 1000 mg via INTRAVENOUS
  Filled 2022-12-11: qty 100

## 2022-12-11 MED ORDER — FENTANYL CITRATE PF 50 MCG/ML IJ SOSY
50.0000 ug | PREFILLED_SYRINGE | INTRAMUSCULAR | Status: DC | PRN
  Administered 2022-12-11 – 2022-12-13 (×2): 50 ug via INTRAVENOUS
  Filled 2022-12-11 (×2): qty 1

## 2022-12-11 MED ORDER — IPRATROPIUM-ALBUTEROL 0.5-2.5 (3) MG/3ML IN SOLN
3.0000 mL | Freq: Two times a day (BID) | RESPIRATORY_TRACT | Status: DC
  Administered 2022-12-11 – 2022-12-17 (×13): 3 mL via RESPIRATORY_TRACT
  Filled 2022-12-11 (×14): qty 3

## 2022-12-11 NOTE — Progress Notes (Signed)
Postop day 1, doing well so far.  No new problems.  Tracheostomy is well-seated.  No bleeding.  Copious secretions.  Cuff deflated.  Stable postop.  Anticipate changing to an uncuffed trach tube on Monday.  After that we can start speech pathology and Passy-Muir valve.  May start oral diet as tolerated now.  Pathology pending.

## 2022-12-11 NOTE — Anesthesia Postprocedure Evaluation (Signed)
Anesthesia Post Note  Patient: [name]  Procedure(s) Performed: TRACHEOSTOMY UNDER LOCAL DIRECT LARYNGOSCOPY WITH BIOPSY     Patient location during evaluation: PACU Anesthesia Type: General Level of consciousness: awake and alert Pain management: pain level controlled Vital Signs Assessment: post-procedure vital signs reviewed and stable Respiratory status: patient connected to tracheostomy mask oxygen Cardiovascular status: blood pressure returned to baseline and stable Postop Assessment: no apparent nausea or vomiting Anesthetic complications: no   No notable events documented.  Last Vitals:  Vitals:   12/11/22 0617 12/11/22 0801  BP:    Pulse:    Resp:    Temp:  37.1 C  SpO2: 96%     Last Pain:  Vitals:   12/11/22 0801  TempSrc: Oral  PainSc:                  Shelton Silvas

## 2022-12-11 NOTE — Inpatient Diabetes Management (Signed)
Inpatient Diabetes Program Recommendations  AACE/ADA: New Consensus Statement on Inpatient Glycemic Control (2015)  Target Ranges:  Prepandial:   less than 140 mg/dL      Peak postprandial:   less than 180 mg/dL (1-2 hours)      Critically ill patients:  140 - 180 mg/dL   Lab Results  Component Value Date   GLUCAP 143 (H) 12/11/2022   HGBA1C 8.0 (H) 12/09/2022    Review of Glycemic Control  Latest Reference Range & Units 12/10/22 21:49 12/11/22 03:27 12/11/22 08:00 12/11/22 11:00  Glucose-Capillary 70 - 99 mg/dL 914 (H) 782 (H) 956 (H) 143 (H)  (H): Data is abnormally high Diabetes history: DM1 x 50 years Outpatient Diabetes medications: Insulin pump - Medtronic Current orders for Inpatient glycemic control: Insulin pump   HgbA1C - 8.0% PCP - Timothy Lasso at [facility] Decadron 10 mg x 2    Inpatient Diabetes Program Recommendations:    Trending well today despite steroids.  Patient progressing and orders placed for advancing diet.  Secure chat sent to pharmacy & RN to confirm Novolog received to refill insulin pump.  Follow.  Thanks, Lujean Rave, MSN, RNC-OB Diabetes Coordinator [phone] (8a-5p)

## 2022-12-11 NOTE — Progress Notes (Addendum)
NAME:  [name], MRN:  [id number], DOB:  03-Dec-1950, LOS: 1 ADMISSION DATE:  12/09/2022, CONSULTATION DATE:  12/10/2022 REFERRING MD:  Dr. Joen Laura, CHIEF COMPLAINT:  Shortness of Breath    History of Present Illness:  This is a 72 year old male with a past medical history of emphysema, CAD, Type 1 diabetes, HLD, HTN who presented to the emergency department with concerns of shortness of breath.  Initial vital signs were temperature 97.4 F, pulse 107, respirations 23, BP 158/78 mmHg O2 sat 95% on room air. Initially thought to be a COPD exacerbation. Upon further examination there was concern for upper airway obstruction.   The patient received a continuous albuterol 10+ ipratropium 0.5 mg neb, an albuterol 2.5 mg nebs, DuoNeb, magnesium sulfate 2 g IVPB and 10 mg of dexamethasone IVP. Patient initially admitted to the floor for further evaluation and management for new laryngeal mass. On 12/10/2022 patient had worsening shortness of breath and rapid response was called. Patient required to go onto BiPAP. PCCM consulted for ICU admit for further evaluation and management.    Pertinent  Medical History  Emphysema, CAD, Tye 1 DM, HLD, HTN, PVD  Significant Hospital Events: Including procedures, antibiotic start and stop dates in addition to other pertinent events   04/17: Patient admitted to [facility] for new laryngeal mass 04/18: Patient had a rapid called for worsening shortness of breath. Patient required to go to ICU closer monitoring.   Interim History / Subjective:  Patient is evaluated at beside this morning. He is getting adjusted to his trach. He is communicating with me via a pen and paper. He state he did have some eye pain yesterday, but it has resolved now. He states he is doing well otherwise and has no other concerns.   Objective   Blood pressure (!) 121/58, pulse 73, temperature 98.7 F (37.1 C), temperature source Oral, resp. rate 17, height 5\' 11"  (1.803 m),  weight 58.6 kg, SpO2 96 %.    FiO2 (%):  [28 %-50 %] 28 %   Intake/Output Summary (Last 24 hours) at 12/11/2022 0736 Last data filed at 12/10/2022 1701 Gross per 24 hour  Intake 400 ml  Output 200 ml  Net 200 ml    Filed Weights   12/09/22 0518 12/09/22 1508  Weight: 68 kg 58.6 kg   Examination: General: Resting in bed in no acute distress  HENT: Normocephalic atraumatic  Lungs: Coarse breath sounds noted, no stridor appreciated  Cardiovascular: RRR, no murmurs rubs or gallops Abdomen: Soft, non distended with normoactive bowel sounds  Extremities: able to move freely  Neuro: able to follow all commands   Resolved Hospital Problem list     Assessment & Plan:  This is a 72 year old male with past medical history of hypertension, COPD, hyperlipidemia who presents emergency department with concerns of shortness of breath.  Patient found to have laryngeal mass, and admitted for further evaluation management.  On second day of admission, patient was found to have worsening shortness of breath requiring BiPAP.  Patient mated to [facility] for closer monitoring.  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 He is tolerating it well. Patient had some tissues samples taken from his mass yesterday but has not resulted yet to see what this is. Patient is being followed by ENT who is planning to change his trach on Monday and at that time he will  likely be able to try a Passeymuir valve. Patient is to get speech evaluation today. Did explain to the patient that he could likley need more surgery depedning on what the biopsies show. He is understanding about this. Did also mention to huim that he should follow with pulm outpatient to further manage his emphysema.  -Continue to monitor respiratory status -Continue with trach collar -ENT to change Trach on Monday 12/14/22 -Discontinue dexamethasone   -Continue to monitor for further secretions -Speech evaluation today -Stable to transfer out of ICU today  #Type 1 diabetes Patient has a history of type 1 diabetes mellitus.  Patient's glucose has been well-controlled at this point.  Goal glucose between 140-180.  Diabetes coordinator to evaluate patient as well. -Continue insulin pump -Continue monitor blood sugars closely   #Hypertension Patient with past medical history of hypertension.  Current blood pressure measuring well.  Continue on home medications. -Continue metoprolol 25 mg nightly -Continue ramipril 5 mg nightly -Continue monitor blood pressure  #Hyperlipidemia Patient past medical history of hyperlipidemia.  Patient currently on Crestor 10 mg daily. Plan continue Crestor 10 mg daily  #Normocytic anemia Hemoglobin stable this morning. -Continue to monitor as patient is going to OR  #Left adrenal adenoma -Follow-up outpatient  #PVD status post left BKA #CAD No acute concerns at this time. -Continue aspirin, metoprolol, Crestor  Best Practice (right click and "Reselect all SmartList Selections" daily)   Diet/type: NPO DVT prophylaxis: LMWH GI prophylaxis: N/A Lines: N/A Foley:  N/A Code Status:  full code  Labs   CBC: Recent Labs  Lab 12/09/22 0535 12/10/22 0246 12/11/22 0055  WBC 12.0* 9.4 18.4*  NEUTROABS  --   --  17.2*  HGB 13.6 12.0* 12.7*  HCT 39.7 33.1* 36.6*  MCV 95.4 91.4 93.8  PLT 382 343 349     Basic Metabolic Panel: Recent Labs  Lab 12/09/22 0535 12/10/22 0246 12/11/22 0055  NA 135 132* 136  K 4.1 4.5 4.8  CL 97* 99 101  CO2 28 26 25   GLUCOSE 183* 301* 150*  BUN 19 24* 24*  CREATININE 0.79 0.89 0.81  CALCIUM 10.0 8.9 8.7*    GFR: Estimated Creatinine Clearance: 70.3 mL/min (by C-G formula based on SCr of 0.81 mg/dL). Recent Labs  Lab 12/09/22 0535 12/10/22 0246 12/11/22 0055  WBC 12.0* 9.4 18.4*     Liver Function Tests: Recent Labs  Lab 12/09/22 0535  12/10/22 0246  AST 28 22  ALT 24 24  ALKPHOS 71 70  BILITOT 0.7 0.6  PROT 7.0 5.8*  ALBUMIN 3.9 3.1*    No results for input(s): "LIPASE", "AMYLASE" in the last 168 hours. No results for input(s): "AMMONIA" in the last 168 hours.  ABG    Component Value Date/Time   HCO3 26.5 12/09/2022 0607   TCO2 28 11/14/2019 1026   O2SAT 81.4 12/09/2022 0607     Coagulation Profile: No results for input(s): "INR", "PROTIME" in the last 168 hours.  Cardiac Enzymes: No results for input(s): "CKTOTAL", "CKMB", "CKMBINDEX", "TROPONINI" in the last 168 hours.  HbA1C: Hgb A1c MFr Bld  Date/Time Value Ref Range Status  12/09/2022 05:34 AM 8.0 (H) 4.8 - 5.6 % Final    Comment:    (NOTE) Pre diabetes:          5.7%-6.4%  Diabetes:              >6.4%  Glycemic control for   <7.0% adults with diabetes   11/14/2019 03:55 PM 8.0 (H)  4.8 - 5.6 % Final    Comment:    (NOTE) Pre diabetes:          5.7%-6.4% Diabetes:              >6.4% Glycemic control for   <7.0% adults with diabetes     CBG: Recent Labs  Lab 12/10/22 1606 12/10/22 1711 12/10/22 1803 12/10/22 2149 12/11/22 0327  GLUCAP 184* 197* 190* 156* 143*     Review of Systems:   Negative except for what is stated in the HPI  Past Medical History:  He,  has a past medical history of Cigarette smoker, Coronary artery disease, Diabetes mellitus, Diabetic retinopathy, Dyslipidemia, Hypertension, and Peripheral vascular disease.   Surgical History:   Past Surgical History:  Procedure Laterality Date   ABDOMINAL ANGIOGRAM N/A 05/29/2014   Procedure: ABDOMINAL ANGIOGRAM;  Surgeon: Pamella Pert, MD;  [facility] C[facility] LAB;  Service: Cardiovascular;  Laterality: N/A;   ABDOMINAL AORTOGRAM W/LOWER EXTREMITY N/A 11/14/2019   Procedure: ABDOMINAL AORTOGRAM W/LOWER EXTREMITY;  Surgeon: Nada Libman, MD;  Location: [facility] INVASIVE CV LAB;  Service: Cardiovascular;  Laterality: N/A;   AMPUTATION Left 10/25/2015   Procedure:  Left Foot 5th Ray Amputation;  Surgeon: Nadara Mustard, MD;  Location: [facility] OR;  Service: Orthopedics;  Laterality: Left;   AMPUTATION Left 12/06/2015   Procedure: AMPUTATION BELOW KNEE;  Surgeon: Nadara Mustard, MD;  Location: [facility] OR;  Service: Orthopedics;  Laterality: Left;   CARDIAC CATHETERIZATION     EF is 55-60% and no wall motion abnormalities (long long time ago)   FEMORAL-POPLITEAL BYPASS GRAFT Left 10/24/2015   Procedure: BYPASS GRAFT FEMORAL below knee POPLITEAL ARTERY with Left Saphenous Vein;  Surgeon: Nada Libman, MD;  Location: [facility] OR;  Service: Vascular;  Laterality: Left;   FEMORAL-POPLITEAL BYPASS GRAFT Right 11/15/2019   Procedure: BYPASS GRAFT FEMORAL-POPLITEAL ARTERY using Right Leg Greater Saphenous Vein;  Surgeon: Nada Libman, MD;  Location: [facility] OR;  Service: Vascular;  Laterality: Right;   FRACTURE SURGERY     left arm "many yrs ago"   LOWER EXTREMITY ANGIOGRAM N/A 01/23/2014   Procedure: LOWER EXTREMITY ANGIOGRAM;  Surgeon: Pamella Pert, MD;  Location: [facility] CATH LAB;  Service: Cardiovascular;  Laterality: N/A;   LOWER EXTREMITY ANGIOGRAM N/A 07/31/2014   Procedure: LOWER EXTREMITY ANGIOGRAM;  Surgeon: Pamella Pert, MD;  Location: [facility] CATH LAB;  Service: Cardiovascular;  Laterality: N/A;   LOWER EXTREMITY ANGIOGRAM Left 10/11/2015   Procedure: Lower Extremity Angiogram;  Surgeon: Nada Libman, MD;  Location: [facility] INVASIVE CV LAB;  Service: Cardiovascular;  Laterality: Left;   PERIPHERAL VASCULAR BALLOON ANGIOPLASTY  11/14/2019   Procedure: PERIPHERAL VASCULAR BALLOON ANGIOPLASTY;  Surgeon: Nada Libman, MD;  Location: [facility] INVASIVE CV LAB;  Service: Cardiovascular;;   PERIPHERAL VASCULAR CATHETERIZATION Left 10/11/2015   Procedure: Peripheral Vascular Balloon Angioplasty;  Surgeon: Nada Libman, MD;  Location: [facility] INVASIVE CV LAB;  Service: Cardiovascular;  Laterality: Left;  sfa failed unable to cross occluded sfa   PERIPHERAL VASCULAR CATHETERIZATION N/A 10/11/2015    Procedure: Abdominal Aortogram;  Surgeon: Nada Libman, MD;  Location: [facility] INVASIVE CV LAB;  Service: Cardiovascular;  Laterality: N/A;   STUMP REVISION Left 03/02/2016   Procedure: Revision Left Below Knee Amputation;  Surgeon: Nadara Mustard, MD;  Location: [facility] OR;  Service: Orthopedics;  Laterality: Left;     Social History:   reports that he has been smoking cigarettes. He has a 15.00 pack-year  smoking history. He has never used smokeless tobacco. He reports current alcohol use. He reports that he does not use drugs.   Family History:  His family history includes Coronary artery disease in his mother; Other in his father.   Allergies Allergies  Allergen Reactions   Simvastatin Other (See Comments)    MYALGIAS, MUSCLE WEAKNESS      Home Medications  Prior to Admission medications   Medication Sig Start Date End Date Taking? Authorizing Provider  albuterol (VENTOLIN HFA) 108 (90 Base) MCG/ACT inhaler Inhale 1-2 puffs into the lungs every 6 (six) hours as needed for wheezing or shortness of breath. Patient taking differently: Inhale 2 puffs into the lungs every 6 (six) hours as needed for wheezing or shortness of breath. 11/27/22  Yes Sloan Leiter, DO  aspirin EC 81 MG tablet Take 325 mg by mouth at bedtime.   Yes [provider]  baclofen (LIORESAL) 20 MG tablet Take 20 mg by mouth daily as needed (for leg cramps). Reported on 12/05/2015 09/09/15  Yes [provider]  calcium carbonate (TUMS - DOSED IN MG ELEMENTAL CALCIUM) 500 MG chewable tablet Chew 1,000-2,000 mg by mouth as needed for indigestion or heartburn.   Yes [provider]  folic acid (FOLVITE) 800 MCG tablet Take 1,600 mcg by mouth at bedtime.   Yes [provider]  HUMALOG 100 UNIT/ML injection 2.1 mLs See admin instructions. Via PUMP Inject 2.1 ml into the pump when it runs out. Pt is unsure how often he fills his pump or the rate of his doses. 01/09/19  Yes [provider]   ibuprofen (ADVIL) 200 MG tablet Take 400 mg by mouth as needed for moderate pain.   Yes [provider]  metoprolol succinate (TOPROL-XL) 25 MG 24 hr tablet Take 1 tablet (25 mg total) by mouth daily. Patient taking differently: Take 25 mg by mouth at bedtime. 02/14/13  Yes Nahser, Deloris Ping, MD  Multiple Vitamins-Minerals (MULTIVITAMIN GUMMIES ADULT PO) Take 3 tablets by mouth at bedtime.   Yes [provider]  ramipril (ALTACE) 5 MG capsule Take 5 mg by mouth at bedtime.   Yes [provider]  rosuvastatin (CRESTOR) 10 MG tablet TAKE 1 TABLET BY MOUTH  DAILY Patient taking differently: Take 10 mg by mouth at bedtime. 06/24/20  Yes Rayford Halsted, PA-C     Critical care time: [address]    Modena Slater, DO Internal Medicine Resident PGY-1 Pager: [phone]

## 2022-12-12 ENCOUNTER — Inpatient Hospital Stay (HOSPITAL_COMMUNITY): Payer: Medicare Other

## 2022-12-12 DIAGNOSIS — C329 Malignant neoplasm of larynx, unspecified: Secondary | ICD-10-CM | POA: Diagnosis not present

## 2022-12-12 DIAGNOSIS — J95811 Postprocedural pneumothorax: Secondary | ICD-10-CM | POA: Diagnosis not present

## 2022-12-12 DIAGNOSIS — Z930 Tracheostomy status: Secondary | ICD-10-CM | POA: Diagnosis not present

## 2022-12-12 LAB — BASIC METABOLIC PANEL
Anion gap: 9 (ref 5–15)
BUN: 26 mg/dL — ABNORMAL HIGH (ref 8–23)
CO2: 27 mmol/L (ref 22–32)
Calcium: 8.6 mg/dL — ABNORMAL LOW (ref 8.9–10.3)
Chloride: 97 mmol/L — ABNORMAL LOW (ref 98–111)
Creatinine, Ser: 0.99 mg/dL (ref 0.61–1.24)
GFR, Estimated: >60 mL/min (ref >60)
Glucose, Bld: 165 mg/dL — ABNORMAL HIGH (ref 70–99)
Potassium: 4.4 mmol/L (ref 3.5–5.1)
Sodium: 133 mmol/L — ABNORMAL LOW (ref 135–145)

## 2022-12-12 LAB — GLUCOSE, CAPILLARY
Glucose-Capillary: 125 mg/dL — ABNORMAL HIGH (ref 70–99)
Glucose-Capillary: 169 mg/dL — ABNORMAL HIGH (ref 70–99)
Glucose-Capillary: 197 mg/dL — ABNORMAL HIGH (ref 70–99)
Glucose-Capillary: 212 mg/dL — ABNORMAL HIGH (ref 70–99)

## 2022-12-12 LAB — CBC
HCT: 37.2 % — ABNORMAL LOW (ref 39.0–52.0)
Hemoglobin: 13.2 g/dL (ref 13.0–17.0)
MCH: 33 pg (ref 26.0–34.0)
MCHC: 35.5 g/dL (ref 30.0–36.0)
MCV: 93 fL (ref 80.0–100.0)
Platelets: 335 10*3/uL (ref 150–400)
RBC: 4 MIL/uL — ABNORMAL LOW (ref 4.22–5.81)
RDW: 13.7 % (ref 11.5–15.5)
WBC: 14.7 10*3/uL — ABNORMAL HIGH (ref 4.0–10.5)
nRBC: 0 % (ref 0.0–0.2)

## 2022-12-12 LAB — CULTURE, RESPIRATORY W GRAM STAIN

## 2022-12-12 MED ORDER — ARFORMOTEROL TARTRATE 15 MCG/2ML IN NEBU
15.0000 ug | INHALATION_SOLUTION | Freq: Two times a day (BID) | RESPIRATORY_TRACT | Status: DC
  Administered 2022-12-12 – 2022-12-17 (×11): 15 ug via RESPIRATORY_TRACT
  Filled 2022-12-12 (×11): qty 2

## 2022-12-12 MED ORDER — BUDESONIDE 0.5 MG/2ML IN SUSP
0.5000 mg | Freq: Two times a day (BID) | RESPIRATORY_TRACT | Status: DC
  Administered 2022-12-12 – 2022-12-17 (×11): 0.5 mg via RESPIRATORY_TRACT
  Filled 2022-12-12 (×11): qty 2

## 2022-12-12 MED ORDER — SODIUM CHLORIDE 0.9 % IV SOLN
3.0000 g | Freq: Four times a day (QID) | INTRAVENOUS | Status: DC
  Administered 2022-12-12 – 2022-12-15 (×11): 3 g via INTRAVENOUS
  Filled 2022-12-12 (×12): qty 8

## 2022-12-12 NOTE — Progress Notes (Signed)
NAME:  [name], MRN:  [id number], DOB:  [birth year], LOS: 2 ADMISSION DATE:  12/09/2022, CONSULTATION DATE:  12/10/2022 REFERRING MD:  Dr. Joen Laura, CHIEF COMPLAINT:  Shortness of Breath    History of Present Illness:  This is a 72 year old male with a past medical history of emphysema, CAD, Type 1 diabetes, HLD, HTN who presented to the emergency department with concerns of shortness of breath.  Initial vital signs were temperature 97.4 F, pulse 107, respirations 23, BP 158/78 mmHg O2 sat 95% on room air. Initially thought to be a COPD exacerbation. Upon further examination there was concern for upper airway obstruction.   The patient received a continuous albuterol 10+ ipratropium 0.5 mg neb, an albuterol 2.5 mg nebs, DuoNeb, magnesium sulfate 2 g IVPB and 10 mg of dexamethasone IVP. Patient initially admitted to the floor for further evaluation and management for new laryngeal mass. On 12/10/2022 patient had worsening shortness of breath and rapid response was called. Patient required to go onto BiPAP. PCCM consulted for ICU admit for further evaluation and management.    Pertinent  Medical History  Emphysema, CAD, Tye 1 DM, HLD, HTN, PVD  Significant Hospital Events: Including procedures, antibiotic start and stop dates in addition to other pertinent events   04/17: Patient admitted to [facility] for new laryngeal mass 04/18: Patient had a rapid called for worsening shortness of breath. Patient required to go to ICU closer monitoring.  4/18 emergent tracheostomy  Interim History / Subjective:   Copious sputum production. Able to eat. On 28% / 6 L  Objective   Blood pressure (!) 129/59, pulse 76, temperature 98.4 F (36.9 C), temperature source Oral, resp. rate 18, height  (1.803 m), weight 58.6 kg, SpO2 98 %.    FiO2 (%):  [28 %] 28 %   Intake/Output Summary (Last 24 hours) at 12/12/2022 1438 Last data filed at 12/12/2022 1200 Gross per 24 hour  Intake 480 ml   Output 750 ml  Net -270 ml    Filed Weights   12/09/22 0518 12/09/22 1508  Weight: 68 kg 58.6 kg   Examination: General: Resting in bed in no acute distress  HENT: Normocephalic atraumatic  Lungs: Decreased breath sounds bilateral, no chest wall crepitus, no accessory muscle use Cardiovascular: RRR, no murmurs rubs or gallops Abdomen: Soft, non distended with normoactive bowel sounds  Extremities: able to move freely  Neuro: Alert, Interactive, able to communicate by writing  Labs show mild hyponatremia, decreased leukocytosis  Chest x-ray dependently reviewed shows small apical right pneumothorax unchanged, unchanged subcutaneous emphysema  Resolved Hospital Problem list     Assessment & Plan:  This is a 72 year old male with past medical history of hypertension, COPD, hyperlipidemia who presents emergency department with concerns of shortness of breath.  Patient found to have laryngeal mass, and admitted for further evaluation management.  On second day of admission, patient was found to have worsening shortness of breath requiring BiPAP.  Patient mated to [facility] for closer monitoring.  #Laryngeal cancer #Upper airway obstruction s/p tracheostomy   -Continue to monitor respiratory status -Continue with trach collar, can decrease FiO2, continue humidity -ENT to change Trach on Monday 12/14/22 -Will need to teach trach care  Right apical pneumothorax Subcutaneous emphysema -Follow-up daily chest x-ray until resolved  #History of COPD  -Budesonide/Brovana -Since he has copious secretions, will treat with Unasyn and can switch to Augmentin on discharge  #Type 1 diabetes Patient has a history of type 1 diabetes  mellitus.  Patient's glucose has been well-controlled at this point.  Goal glucose between 140-180.  Diabetes coordinator to evaluate patient as well. -Continue insulin pump -Continue monitor blood sugars closely   #Left adrenal adenoma -Follow-up  outpatient  #PVD status post left BKA #CAD # Hypertension Per [facility] -Continue aspirin, metoprolol, Crestor  Updated daughters at bedside and discussed with [facility].  He will need education about trach care and awaiting ENT/oncology to outline plan of care  Best Practice (right click and "Reselect all SmartList Selections" daily)   Diet/type: NPO DVT prophylaxis: LMWH GI prophylaxis: N/A Lines: N/A Foley:  N/A Code Status:  full code  Labs   CBC: Recent Labs  Lab 12/09/22 0535 12/10/22 0246 12/11/22 0055 12/12/22 0134  WBC 12.0* 9.4 18.4* 14.7*  NEUTROABS  --   --  17.2*  --   HGB 13.6 12.0* 12.7* 13.2  HCT 39.7 33.1* 36.6* 37.2*  MCV 95.4 91.4 93.8 93.0  PLT 382 343 349 335     Basic Metabolic Panel: Recent Labs  Lab 12/09/22 0535 12/10/22 0246 12/11/22 0055 12/12/22 0134  NA 135 132* 136 133*  K 4.1 4.5 4.8 4.4  CL 97* 99 101 97*  CO2 GLUCOSE 183* 301* 150* 165*  BUN 19 24* 24* 26*  CREATININE 0.79 0.89 0.81 0.99  CALCIUM 10.0 8.9 8.7* 8.6*    GFR: Estimated Creatinine Clearance: 57.5 mL/min (by C-G formula based on SCr of 0.99 mg/dL). Recent Labs  Lab 12/09/22 0535 12/10/22 0246 12/11/22 0055 12/12/22 0134  WBC 12.0* 9.4 18.4* 14.7*     Liver Function Tests: Recent Labs  Lab 12/09/22 0535 12/10/22 0246  AST 28 22  ALT 24 24  ALKPHOS 71 70  BILITOT 0.7 0.6  PROT 7.0 5.8*  ALBUMIN 3.9 3.1*    No results for input(s): "LIPASE", "AMYLASE" in the last 168 hours. No results for input(s): "AMMONIA" in the last 168 hours.  ABG    Component Value Date/Time   HCO3 26.5 12/09/2022 0607   TCO2 28 11/14/2019 1026   O2SAT 81.4 12/09/2022 0607     Coagulation Profile: No results for input(s): "INR", "PROTIME" in the last 168 hours.  Cardiac Enzymes: No results for input(s): "CKTOTAL", "CKMB", "CKMBINDEX", "TROPONINI" in the last 168 hours.  HbA1C: Hgb A1c MFr Bld  Date/Time Value Ref Range Status  12/09/2022 05:34 AM 8.0 (H)  4.8 - 5.6 % Final    Comment:    (NOTE) Pre diabetes:          5.7%-6.4%  Diabetes:              >6.4%  Glycemic control for   <7.0% adults with diabetes   11/14/2019 03:55 PM 8.0 (H) 4.8 - 5.6 % Final    Comment:    (NOTE) Pre diabetes:          5.7%-6.4% Diabetes:              >6.4% Glycemic control for   <7.0% adults with diabetes     CBG: Recent Labs  Lab 12/11/22 1100 12/11/22 1702 12/11/22 2122 12/12/22 0615 12/12/22 1106  GLUCAP 143* 143* 137* 125* 169*    Cyril Mourning MD. FCCP.  Pulmonary & Critical care Pager : [phone]  If no response to pager , please call [phone] until 7 pm After 7:00 pm call Elink  [phone]   12/12/2022

## 2022-12-12 NOTE — Progress Notes (Signed)
Pharmacy Antibiotic Note  [name] is a 72 y.o. male admitted on 12/09/2022 with  aspiration pneumonia .  Pharmacy has been consulted for Unasyn dosing.Pt afebrile, WBC 14.7, Scr 0.99 (BL 0.8).   Plan: Unasyn 3g IV q6h Monitor sputum cx, s/sx improvement, renal function, LOT  Height:  (180.3 cm) Weight: 58.6 kg (129 lb 3.2 oz) IBW/kg (Calculated) : 75.3  Temp (24hrs), Avg:98.5 F (36.9 C), Min:98.4 F (36.9 C), Max:98.6 F (37 C)  Recent Labs  Lab 12/09/22 0535 12/10/22 0246 12/11/22 0055 12/12/22 0134  WBC 12.0* 9.4 18.4* 14.7*  CREATININE 0.79 0.89 0.81 0.99    Estimated Creatinine Clearance: 57.5 mL/min (by C-G formula based on SCr of 0.99 mg/dL).    Allergies  Allergen Reactions   Simvastatin Other (See Comments)    MYALGIAS, MUSCLE WEAKNESS     Antimicrobials this admission: 4/20 Unasyn >>   Microbiology results: 4/20 Sputum: sent  4/18 COVID-19: neg 4/17 RVP: neg  Thank you for allowing pharmacy to be a part of this patient's care.  Larena Sox, PharmD PGY1 Pharmacy Resident   12/12/2022  2:56 PM

## 2022-12-12 NOTE — Progress Notes (Signed)
SLP Cancellation Note  Patient Details Name: [name] MRN: [id number] DOB: 05-13-1951   Cancelled treatment:       Reason Eval/Treat Not Completed: Medical issues which prohibited therapy;Patient not medically ready; ST will f/u for PMV/BSE as pt able.   Pat Aqil Goetting,M.S., CCC-SLP 12/12/2022, 8:56 AM

## 2022-12-12 NOTE — Progress Notes (Signed)
PROGRESS NOTE    CAPRI RABEN  ZOX:[id number] DOB: 1951/05/15 DOA: 12/09/2022 PCP: [name], MD  Outpatient Specialists:     Brief Narrative:  Patient is a 72 year old male with past medical history significant for type 1 diabetes mellitus, coronary artery disease, hyperlipidemia, hypertension and s/p left below-knee amputation.  Patient has had long history of cigarette use.  Patient was diagnosed with COPD recently.  Patient presented with stridor and difficulty breathing.  Imaging studies reveal vocal cord lesion/laryngeal mass and vocal cord nodule.  ENT team was consulted to assist with patient's management.  Patient has had laryngoscopy and biopsy.  Biopsy result is pending.  Patient is also status post tracheostomy.  Hospitalist team assumed care today, 12/12/2022.  12/12/2022: Patient seen alongside patient's 2 daughters.  Also discussed with the ICU team extensively.  Above documentation is noted.  Pathology result is pending.  Secretions have been better today.   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 -ENT input is appreciated. -ICU team is appreciated. -Pursue biopsy result. -Further management will depend on biopsy result. -ENT and ICU team is directing trach care.  Right apical pneumothorax Subcutaneous emphysema -Follow-up daily chest x-ray until resolved   Recent diagnosis of COPD: -Patient  smoked for very long time. -Budesonide/Brovana -Antibiotics.   Type 1 diabetes: -Continue to optimize.     Left adrenal adenoma -Await the result of laryngeal mass biopsy. -May need further workup.   PVD: -Status post left BKA  CAD: -Stable.  Hypertension: -Continue to optimize. -Goal blood pressure should be less than 130/80 mmHg.   DVT prophylaxis: Subcutaneous Lovenox Code Status: Full code Family Communication: 2 daughters Disposition Plan: This will depend on hospital course   Consultants:  ENT ICU/pulmonary  Procedures:  Laryngeal mass biopsy. Tracheostomy.  Antimicrobials:  IV Unasyn.   Subjective: No new complaints today.  Objective: Vitals:   12/12/22 0900 12/12/22 0931 12/12/22 1115 12/12/22 1150  BP:  (!) 129/59 (!) 129/59   Pulse:  83 83 76  Resp: (!) 23 20 20 18   Temp:  98.4 F (36.9 C) 98.4 F (36.9 C)   TempSrc:   Oral   SpO2:   97% 98%  Weight:      Height:        Intake/Output Summary (Last 24 hours) at 12/12/2022 1232 Last data filed at 12/11/2022 1954 Gross per 24 hour  Intake --  Output 300 ml  Net -300 ml   Filed Weights   12/09/22 0518 12/09/22 1508  Weight: 68 kg 58.6 kg    Examination:  General exam: Appears calm and comfortable.  Patient is status post left BKA. Respiratory system: Decreased air entry.  Cardiovascular system: S1 & S2 heard Gastrointestinal system: Abdomen is soft and nontender.   Central nervous system: Awake and alert.  Patient moves all extremities.   Extremities: Status post left BKA  Data Reviewed: I have personally reviewed following labs and imaging studies  CBC:  Recent Labs  Lab 12/09/22 0535 12/10/22 0246 12/11/22 0055 12/12/22 0134  WBC 12.0* 9.4 18.4* 14.7*  NEUTROABS  --   --  17.2*  --   HGB 13.6 12.0* 12.7* 13.2  HCT 39.7 33.1* 36.6* 37.2*  MCV 95.4 91.4 93.8 93.0  PLT 382 343 349 335   Basic Metabolic Panel: Recent Labs  Lab 12/09/22 0535 12/10/22 0246  12/11/22 0055 12/12/22 0134  NA 135 132* 136 133*  K 4.1 4.5 4.8 4.4  CL 97* 99 101 97*  CO2 GLUCOSE 183* 301* 150* 165*  BUN 19 24* 24* 26*  CREATININE 0.79 0.89 0.81 0.99  CALCIUM 10.0 8.9 8.7* 8.6*   GFR: Estimated Creatinine Clearance: 57.5 mL/min (by C-G formula based on SCr of 0.99 mg/dL). Liver Function Tests: Recent Labs  Lab 12/09/22 0535 12/10/22 0246  AST 28 22  ALT 24 24  ALKPHOS 71 70  BILITOT 0.7 0.6  PROT 7.0 5.8*  ALBUMIN 3.9 3.1*   No results for input(s): "LIPASE", "AMYLASE" in the last 168 hours. No results for input(s): "AMMONIA" in the last 168 hours. Coagulation Profile: No results for input(s): "INR", "PROTIME" in the last 168 hours. Cardiac Enzymes: No results for input(s): "CKTOTAL", "CKMB", "CKMBINDEX", "TROPONINI" in the last 168 hours. BNP (last 3 results) No results for input(s): "PROBNP" in the last 8760 hours. HbA1C: No results for input(s): "HGBA1C" in the last 72 hours. CBG: Recent Labs  Lab 12/11/22 1100 12/11/22 1702 12/11/22 2122 12/12/22 0615 12/12/22 1106  GLUCAP 143* 143* 137* 125* 169*   Lipid Profile: No results for input(s): "CHOL", "HDL", "LDLCALC", "TRIG", "CHOLHDL", "LDLDIRECT" in the last 72 hours. Thyroid Function Tests: No results for input(s): "TSH", "T4TOTAL", "FREET4", "T3FREE", "THYROIDAB" in the last 72 hours. Anemia Panel: No results for input(s): "VITAMINB12", "FOLATE", "FERRITIN", "TIBC", "IRON", "RETICCTPCT" in the last 72 hours. Urine analysis:    Component Value Date/Time   COLORURINE YELLOW 10/22/2015 1307   APPEARANCEUR CLEAR 10/22/2015 1307   LABSPEC 1.011 10/22/2015 1307   PHURINE 5.0 10/22/2015 1307   GLUCOSEU >1000 (A) 10/22/2015 1307   HGBUR NEGATIVE 10/22/2015 1307   BILIRUBINUR NEGATIVE 10/22/2015 1307   KETONESUR NEGATIVE 10/22/2015 1307   PROTEINUR NEGATIVE 10/22/2015 1307   NITRITE NEGATIVE 10/22/2015 1307   LEUKOCYTESUR NEGATIVE 10/22/2015 1307   Sepsis  Labs: (procalcitonin:4,lacticidven:4)  ) Recent Results (from the past 240 hour(s))  Respiratory (~20 pathogens) panel by PCR     Status: None   Collection Time: 12/09/22  3:29 PM   Specimen: Nasopharyngeal Swab; Respiratory  Result Value Ref Range Status   Adenovirus NOT DETECTED NOT DETECTED Final   Coronavirus 229E NOT DETECTED NOT DETECTED Final    Comment: (NOTE) The Coronavirus on the Respiratory Panel, DOES NOT test for the novel  Coronavirus (2019 nCoV)    Coronavirus HKU1 NOT DETECTED NOT DETECTED Final   Coronavirus NL63 NOT DETECTED NOT DETECTED Final   Coronavirus OC43 NOT DETECTED NOT DETECTED Final   Metapneumovirus NOT DETECTED NOT DETECTED Final   Rhinovirus / Enterovirus NOT DETECTED NOT DETECTED Final   Influenza A NOT DETECTED NOT DETECTED Final   Influenza B NOT DETECTED NOT DETECTED Final   Parainfluenza Virus 1 NOT DETECTED NOT DETECTED Final   Parainfluenza Virus 2 NOT DETECTED NOT DETECTED Final   Parainfluenza Virus 3 NOT DETECTED NOT DETECTED Final   Parainfluenza Virus 4 NOT DETECTED NOT DETECTED Final   Respiratory Syncytial Virus NOT DETECTED NOT DETECTED Final  Bordetella pertussis NOT DETECTED NOT DETECTED Final   Bordetella Parapertussis NOT DETECTED NOT DETECTED Final   Chlamydophila pneumoniae NOT DETECTED NOT DETECTED Final   Mycoplasma pneumoniae NOT DETECTED NOT DETECTED Final    Comment: Performed at [facility], [address], [place], [place] [address]  SARS Coronavirus 2 by RT PCR (hospital order, performed in [facility] hospital lab) *cepheid single result test* Anterior Nasal Swab     Status: None   Collection Time: 12/10/22  1:56 PM   Specimen: Anterior Nasal Swab  Result Value Ref Range Status   SARS Coronavirus 2 by RT PCR NEGATIVE NEGATIVE Final    Comment: Performed at [facility], [address] [place], [place] [address]  MRSA Next Gen by PCR, Nasal     Status: None   Collection Time: 12/10/22  6:18  PM   Specimen: Nasal Mucosa; Nasal Swab  Result Value Ref Range Status   MRSA by PCR Next Gen NOT DETECTED NOT DETECTED Final    Comment: (NOTE) The GeneXpert MRSA Assay (FDA approved for NASAL specimens only), is one component of a comprehensive MRSA colonization surveillance program. It is not intended to diagnose MRSA infection nor to guide or monitor treatment for MRSA infections. Test performance is not FDA approved in patients less than 6 years old. Performed at [facility], [address] [place], [place] [address]          Radiology Studies: DG CHEST PORT 1 VIEW  Result Date: 12/12/2022 CLINICAL DATA:  Follow-up pneumothorax EXAM: PORTABLE CHEST 1 VIEW COMPARISON:  12/11/2022 FINDINGS: Cardiac shadow is within normal limits. Tracheostomy tube is noted in satisfactory position. Lungs are well aerated bilaterally. Small right-sided pneumothorax is again noted and stable. Subcutaneous emphysema is seen. No focal confluent infiltrate is noted. IMPRESSION: Stable right pneumothorax. Electronically Signed   By: Alcide Clever M.D.   On: 12/12/2022 09:04   DG CHEST PORT 1 VIEW  Result Date: 12/11/2022 CLINICAL DATA:  [id number] with pneumothorax and tracheostomy. Shortness of breath. Evaluate airspace disease and pleural effusions. EXAM: PORTABLE CHEST 1 VIEW COMPARISON:  Portable chest yesterday at 9:50 p.m. FINDINGS: 4:31 a.m. Tracheostomy cannula again terminates about 6.4 cm from the carina, unchanged. Left chest wall and bilateral supraclavicular soft tissue emphysema continue to be seen and show mild improvement. At least a small pneumomediastinum continues to be noted along the left heart border and may have improved. There is a small right apical pneumothorax estimated 5% of the chest volume and seems grossly unchanged, without mediastinal shift. There is no appreciable left pneumothorax. The lungs are generally clear. The cardiomediastinal silhouette is normal apart from the  pneumomediastinum. IMPRESSION: 1. Small right apical pneumothorax, estimated 5% of the chest volume, seems grossly unchanged without mediastinal shift. 2. Left chest wall and bilateral supraclavicular soft tissue emphysema continue to be seen and show mild improvement. 3. Small pneumomediastinum may have improved. 4. No consolidation or effusion is seen. Electronically Signed   By: Almira Bar M.D.   On: 12/11/2022 06:31   DG CHEST PORT 1 VIEW  Addendum Date: 12/10/2022   ADDENDUM REPORT: 12/10/2022 22:57 ADDENDUM: Critical Value/emergent results were called by telephone at the time of interpretation on 12/10/2022 at 10:56 pm to provider ADITYA PALIWAL , who verbally acknowledged these results. Electronically Signed   By: Minerva Fester M.D.   On: 12/10/2022 22:57   Result Date: 12/10/2022 CLINICAL DATA:  Hypoxia EXAM: PORTABLE CHEST 1 VIEW COMPARISON:  Chest  radiograph 12/09/2022 FINDINGS: New tracheostomy tube with tip in the mid intrathoracic trachea. There is new pneumomediastinum. Cardiomediastinal silhouette is otherwise stable. Small right apical pneumothorax. No evidence of tension morphology. Extensive subcutaneous emphysema through the left chest wall and bilateral neck. No focal consolidation or pleural effusion. IMPRESSION: 1. New tracheostomy tube with tip in the mid intrathoracic trachea. 2. New small right apical pneumothorax. 3. New pneumomediastinum. 4. Extensive subcutaneous emphysema throughout the left chest wall and bilateral neck. Electronically Signed: By: Minerva Fester M.D. On: 12/10/2022 22:53        Scheduled Meds:  aspirin EC  325 mg Oral QHS   Chlorhexidine Gluconate Cloth  6 each Topical Daily   enoxaparin (LOVENOX) injection  40 mg Subcutaneous Q24H   folic acid  1,500 mcg Oral QHS   insulin pump   Subcutaneous TID WC, HS, 0200   ipratropium-albuterol  3 mL Nebulization BID   metoprolol succinate  25 mg Oral QHS   ramipril  5 mg Oral QHS   rosuvastatin  10 mg  Oral QHS   Continuous Infusions:   LOS: 2 days    Time spent: 55 minutes.    Berton Mount, MD  [facility] Pager #: [phone] 7PM-7AM contact night coverage as above

## 2022-12-12 NOTE — Progress Notes (Signed)
Patient has his own home insulin pump.  Insulin pump contract was explained to patient and is signed in the chart.  Patient educated regarding insulin pump flowsheet that he will keep at the bedside and record his sugars and insulin dosing throughout hospitalization.

## 2022-12-13 ENCOUNTER — Inpatient Hospital Stay (HOSPITAL_COMMUNITY): Payer: Medicare Other

## 2022-12-13 DIAGNOSIS — C329 Malignant neoplasm of larynx, unspecified: Secondary | ICD-10-CM | POA: Diagnosis not present

## 2022-12-13 DIAGNOSIS — J95811 Postprocedural pneumothorax: Secondary | ICD-10-CM | POA: Diagnosis not present

## 2022-12-13 LAB — GLUCOSE, CAPILLARY
Glucose-Capillary: 147 mg/dL — ABNORMAL HIGH (ref 70–99)
Glucose-Capillary: 100 mg/dL — ABNORMAL HIGH (ref 70–99)
Glucose-Capillary: 215 mg/dL — ABNORMAL HIGH (ref 70–99)
Glucose-Capillary: 208 mg/dL — ABNORMAL HIGH (ref 70–99)
Glucose-Capillary: 374 mg/dL — ABNORMAL HIGH (ref 70–99)

## 2022-12-13 LAB — CBC WITH DIFFERENTIAL/PLATELET
Abs Immature Granulocytes: 0.04 10*3/uL (ref 0.00–0.07)
Basophils Absolute: 0 10*3/uL (ref 0.0–0.1)
Basophils Relative: 0 %
Eosinophils Absolute: 0 10*3/uL (ref 0.0–0.5)
Eosinophils Relative: 0 %
HCT: 35 % — ABNORMAL LOW (ref 39.0–52.0)
Hemoglobin: 12 g/dL — ABNORMAL LOW (ref 13.0–17.0)
Immature Granulocytes: 0 %
Lymphocytes Relative: 8 %
Lymphs Abs: 0.9 10*3/uL (ref 0.7–4.0)
MCH: 32.5 pg (ref 26.0–34.0)
MCHC: 34.3 g/dL (ref 30.0–36.0)
MCV: 94.9 fL (ref 80.0–100.0)
Monocytes Absolute: 1.2 10*3/uL — ABNORMAL HIGH (ref 0.1–1.0)
Monocytes Relative: 11 %
Neutro Abs: 9 10*3/uL — ABNORMAL HIGH (ref 1.7–7.7)
Neutrophils Relative %: 81 %
Platelets: 295 10*3/uL (ref 150–400)
RBC: 3.69 MIL/uL — ABNORMAL LOW (ref 4.22–5.81)
RDW: 13.6 % (ref 11.5–15.5)
WBC: 11.2 10*3/uL — ABNORMAL HIGH (ref 4.0–10.5)
nRBC: 0 % (ref 0.0–0.2)

## 2022-12-13 LAB — RENAL FUNCTION PANEL
Albumin: 2.5 g/dL — ABNORMAL LOW (ref 3.5–5.0)
Anion gap: 11 (ref 5–15)
BUN: 27 mg/dL — ABNORMAL HIGH (ref 8–23)
CO2: 26 mmol/L (ref 22–32)
Calcium: 8.4 mg/dL — ABNORMAL LOW (ref 8.9–10.3)
Chloride: 99 mmol/L (ref 98–111)
Creatinine, Ser: 1 mg/dL (ref 0.61–1.24)
GFR, Estimated: >60 mL/min (ref >60)
Glucose, Bld: 177 mg/dL — ABNORMAL HIGH (ref 70–99)
Phosphorus: 2.6 mg/dL (ref 2.5–4.6)
Potassium: 3.9 mmol/L (ref 3.5–5.1)
Sodium: 136 mmol/L (ref 135–145)

## 2022-12-13 NOTE — Progress Notes (Signed)
PROGRESS NOTE    [name]  YNW:[id number] DOB: February 14, 1951 DOA: 12/09/2022 PCP: [name], MD  Outpatient Specialists:     Brief Narrative:  Patient is a 72 year old male with past medical history significant for type 1 diabetes mellitus, coronary artery disease, hyperlipidemia, hypertension and s/p left below-knee amputation.  Patient has had long history of cigarette use.  Patient was diagnosed with COPD recently.  Patient presented with stridor and difficulty breathing.  Imaging studies reveal vocal cord lesion/laryngeal mass and vocal cord nodule.  ENT team was consulted to assist with patient's management.  Patient has had laryngoscopy and biopsy.  Biopsy result is pending.  Patient is also status post tracheostomy.  Hospitalist team assumed care today, 12/12/2022.  12/12/2022: Patient seen alongside patient's 2 daughters.  Also discussed with the ICU team extensively.  Above documentation is noted.  Pathology result is pending.  Secretions have been better today.  12/13/2022: Patient seen.  Laryngeal mass pathology still pending.   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 -ENT input is appreciated. -ICU team is appreciated. -Pursue biopsy result. -Further management will depend on biopsy result. -ENT and ICU team is directing trach care. 12/13/2022: Pursue result of laryngeal mass.  Right apical  pneumothorax Subcutaneous emphysema -Follow-up daily chest x-ray until resolved   Recent diagnosis of COPD: -Patient smoked for very long time. -Budesonide/Brovana -Antibiotics.   Type 1 diabetes: -Continue to optimize.     Left adrenal adenoma -Await the result of laryngeal mass biopsy. -May need further workup.   PVD: -Status post left BKA  CAD: -Stable.  Hypertension: -Continue to optimize. -Goal blood pressure should be less than 130/80 mmHg.   DVT prophylaxis: Subcutaneous Lovenox Code Status: Full code Family Communication: 2 daughters Disposition Plan: This will depend on hospital course   Consultants:  ENT ICU/pulmonary  Procedures:  Laryngeal mass biopsy. Tracheostomy.  Antimicrobials:  IV Unasyn.   Subjective: No new complaints today.  Objective: Vitals:   12/13/22 0759 12/13/22 1137 12/13/22 1512 12/13/22 1621  BP:    (!) 125/57  Pulse:  76 79 75  Resp:  20 18 18   Temp:    98.3 F (36.8 C)  TempSrc:    Oral  SpO2: 97% 98% 95% 92%  Weight:      Height:        Intake/Output Summary (Last 24 hours) at 12/13/2022 1843 Last data filed at 12/13/2022 1400 Gross per 24 hour  Intake 1540 ml  Output 550 ml  Net 990 ml    Filed Weights   12/09/22 0518 12/09/22 1508  Weight: 68 kg 58.6 kg    Examination:  General exam: Appears calm and comfortable.  Patient is status post left BKA. Respiratory system: Decreased air entry.  Cardiovascular system: S1 & S2 heard Gastrointestinal system: Abdomen is soft and nontender.   Central nervous system: Awake and alert.  Patient moves all extremities.   Extremities: Status post left BKA  Data Reviewed: I have personally reviewed following labs and imaging studies  CBC: Recent Labs  Lab 12/09/22 0535 12/10/22 0246 12/11/22 0055 12/12/22 0134 12/13/22 1243  WBC 12.0* 9.4 18.4* 14.7* 11.2*  NEUTROABS  --   --  17.2*  --  9.0*  HGB 13.6 12.0* 12.7* 13.2 12.0*  HCT 39.7 33.1* 36.6* 37.2*  35.0*  MCV 95.4 91.4 93.8 93.0 94.9  PLT 382 343 349 335 295    Basic Metabolic Panel: Recent Labs  Lab 12/09/22 0535 12/10/22 0246 12/11/22 0055 12/12/22 0134 12/13/22 1243  NA 135 132* 136 133* 136  K 4.1 4.5 4.8 4.4 3.9  CL 97* 99 101 97* 99  CO2 GLUCOSE 183* 301* 150* 165* 177*  BUN 19 24* 24* 26* 27*  CREATININE 0.79 0.89 0.81 0.99 1.00  CALCIUM 10.0 8.9 8.7* 8.6* 8.4*  PHOS  --   --   --   --  2.6    GFR: Estimated Creatinine Clearance: 57 mL/min (by C-G formula based on SCr of 1 mg/dL). Liver Function Tests: Recent Labs  Lab 12/09/22 0535 12/10/22 0246 12/13/22 1243  AST 28 22  --   ALT 24 24  --   ALKPHOS 71 70  --   BILITOT 0.7 0.6  --   PROT 7.0 5.8*  --   ALBUMIN 3.9 3.1* 2.5*    No results for input(s): "LIPASE", "AMYLASE" in the last 168 hours. No results for input(s): "AMMONIA" in the last 168 hours. Coagulation Profile: No results for input(s): "INR", "PROTIME" in the last 168 hours. Cardiac Enzymes: No results for input(s): "CKTOTAL", "CKMB", "CKMBINDEX", "TROPONINI" in the last 168 hours. BNP (last 3 results) No results for input(s): "PROBNP" in the last 8760 hours. HbA1C: No results for input(s): "HGBA1C" in the last 72 hours. CBG: Recent Labs  Lab 12/12/22 2102 12/13/22 0311 12/13/22 0542 12/13/22 1108 12/13/22 1626  GLUCAP 212* 147* 100* 215* 208*    Lipid Profile: No results for input(s): "CHOL", "HDL", "LDLCALC", "TRIG", "CHOLHDL", "LDLDIRECT" in the last 72 hours. Thyroid Function Tests: No results for input(s): "TSH", "T4TOTAL", "FREET4", "T3FREE", "THYROIDAB" in the last 72 hours. Anemia Panel: No results for input(s): "VITAMINB12", "FOLATE", "FERRITIN", "TIBC", "IRON", "RETICCTPCT" in the last 72 hours. Urine analysis:    Component Value Date/Time   COLORURINE YELLOW 10/22/2015 1307   APPEARANCEUR CLEAR 10/22/2015 1307   LABSPEC 1.011 10/22/2015 1307   PHURINE 5.0 10/22/2015 1307   GLUCOSEU >1000 (A)  10/22/2015 1307   HGBUR NEGATIVE 10/22/2015 1307   BILIRUBINUR NEGATIVE 10/22/2015 1307   KETONESUR NEGATIVE 10/22/2015 1307   PROTEINUR NEGATIVE 10/22/2015 1307   NITRITE NEGATIVE 10/22/2015 1307   LEUKOCYTESUR NEGATIVE 10/22/2015 1307   Sepsis Labs: (procalcitonin:4,lacticidven:4)  ) Recent Results (from the past 240 hour(s))  Respiratory (~20 pathogens) panel by PCR     Status: None   Collection Time: 12/09/22  3:29 PM   Specimen: Nasopharyngeal Swab; Respiratory  Result Value Ref Range Status   Adenovirus NOT DETECTED NOT DETECTED Final   Coronavirus 229E NOT DETECTED NOT DETECTED Final    Comment: (NOTE) The Coronavirus on the Respiratory Panel, DOES NOT test for the novel  Coronavirus (2019 nCoV)    Coronavirus HKU1 NOT DETECTED NOT DETECTED Final   Coronavirus NL63 NOT DETECTED NOT DETECTED Final   Coronavirus OC43 NOT DETECTED NOT DETECTED Final   Metapneumovirus NOT DETECTED NOT DETECTED Final   Rhinovirus / Enterovirus NOT DETECTED NOT DETECTED Final  Influenza A NOT DETECTED NOT DETECTED Final   Influenza B NOT DETECTED NOT DETECTED Final   Parainfluenza Virus 1 NOT DETECTED NOT DETECTED Final   Parainfluenza Virus 2 NOT DETECTED NOT DETECTED Final   Parainfluenza Virus 3 NOT DETECTED NOT DETECTED Final   Parainfluenza Virus 4 NOT DETECTED NOT DETECTED Final   Respiratory Syncytial Virus NOT DETECTED NOT DETECTED Final   Bordetella pertussis NOT DETECTED NOT DETECTED Final   Bordetella Parapertussis NOT DETECTED NOT DETECTED Final   Chlamydophila pneumoniae NOT DETECTED NOT DETECTED Final   Mycoplasma pneumoniae NOT DETECTED NOT DETECTED Final    Comment: Performed at [facility], [address] [place], [place] [address]  SARS Coronavirus 2 by RT PCR (hospital order, performed in [facility] hospital lab) *cepheid single result test* Anterior Nasal Swab     Status: None   Collection Time: 12/10/22  1:56 PM   Specimen: Anterior Nasal Swab   Result Value Ref Range Status   SARS Coronavirus 2 by RT PCR NEGATIVE NEGATIVE Final    Comment: Performed at [facility], [address] [place], [place] [address]  MRSA Next Gen by PCR, Nasal     Status: None   Collection Time: 12/10/22  6:18 PM   Specimen: Nasal Mucosa; Nasal Swab  Result Value Ref Range Status   MRSA by PCR Next Gen NOT DETECTED NOT DETECTED Final    Comment: (NOTE) The GeneXpert MRSA Assay (FDA approved for NASAL specimens only), is one component of a comprehensive MRSA colonization surveillance program. It is not intended to diagnose MRSA infection nor to guide or monitor treatment for MRSA infections. Test performance is not FDA approved in patients less than 71 years old. Performed at [facility], [address] [place], [place] [address]   Culture, Respiratory w Gram Stain     Status: None (Preliminary result)   Collection Time: 12/12/22  2:45 PM   Specimen: Tracheal Aspirate; Respiratory  Result Value Ref Range Status   Specimen Description TRACHEAL ASPIRATE  Final   Special Requests NONE  Final   Gram Stain   Final    RARE WBC PRESENT, PREDOMINANTLY PMN MODERATE GRAM NEGATIVE RODS FEW GRAM POSITIVE COCCI    Culture   Final    CULTURE REINCUBATED FOR BETTER GROWTH Performed at [facility], [address] [place], [place] [address]    Report Status PENDING  Incomplete         Radiology Studies: DG Chest Port 1 View  Result Date: 12/13/2022 CLINICAL DATA:  Follow-up pneumothorax. EXAM: PORTABLE CHEST 1 VIEW COMPARISON:  Multiple recent chest x-rays. FINDINGS: The tracheostomy tube is stable. Small residual right apical pneumothorax, less than 5%. Persistent but improving subcutaneous emphysema and pneumomediastinum. Stable advanced emphysematous changes and pulmonary scarring. IMPRESSION: 1. Small residual right apical pneumothorax, less than 5%. 2. Persistent but improving subcutaneous emphysema and pneumomediastinum.  Electronically Signed   By: Rudie Meyer M.D.   On: 12/13/2022 09:10   DG CHEST PORT 1 VIEW  Result Date: 12/12/2022 CLINICAL DATA:  Follow-up pneumothorax EXAM: PORTABLE CHEST 1 VIEW COMPARISON:  12/11/2022 FINDINGS: Cardiac shadow is within normal limits. Tracheostomy tube is noted in satisfactory position. Lungs are well aerated bilaterally. Small right-sided pneumothorax is again noted and stable. Subcutaneous emphysema is seen. No focal confluent infiltrate is noted. IMPRESSION: Stable right pneumothorax. Electronically Signed   By: Alcide Clever M.D.   On: 12/12/2022 09:04  Scheduled Meds:  arformoterol  15 mcg Nebulization BID   aspirin EC  325 mg Oral QHS   budesonide (PULMICORT) nebulizer solution  0.5 mg Nebulization BID   Chlorhexidine Gluconate Cloth  6 each Topical Daily   enoxaparin (LOVENOX) injection  40 mg Subcutaneous Q24H   folic acid  1,500 mcg Oral QHS   insulin pump   Subcutaneous TID WC, HS, 0200   ipratropium-albuterol  3 mL Nebulization BID   metoprolol succinate  25 mg Oral QHS   ramipril  5 mg Oral QHS   rosuvastatin  10 mg Oral QHS   Continuous Infusions:  ampicillin-sulbactam (UNASYN) IV 3 g (12/13/22 1612)     LOS: 3 days    Time spent: 35 minutes.    Berton Mount, MD  [facility] Pager #: [phone] 7PM-7AM contact night coverage as above

## 2022-12-13 NOTE — Progress Notes (Signed)
NAME:  [name], MRN:  [id number], DOB:  09/28/1950, LOS: 3 ADMISSION DATE:  12/09/2022, CONSULTATION DATE:  12/10/2022 REFERRING MD:  Dr. Joen Laura, CHIEF COMPLAINT:  Shortness of Breath    History of Present Illness:  This is a 72 year old male with a past medical history of emphysema, CAD, Type 1 diabetes, HLD, HTN who presented to the emergency department with concerns of shortness of breath.  Initial vital signs were temperature 97.4 F, pulse 107, respirations 23, BP 158/78 mmHg O2 sat 95% on room air. Initially thought to be a COPD exacerbation. Upon further examination there was concern for upper airway obstruction.   The patient received a continuous albuterol 10+ ipratropium 0.5 mg neb, an albuterol 2.5 mg nebs, DuoNeb, magnesium sulfate 2 g IVPB and 10 mg of dexamethasone IVP. Patient initially admitted to the floor for further evaluation and management for new laryngeal mass. On 12/10/2022 patient had worsening shortness of breath and rapid response was called. Patient required to go onto BiPAP. PCCM consulted for ICU admit for further evaluation and management.    Pertinent  Medical History  Emphysema, CAD, Tye 1 DM, HLD, HTN, PVD  Significant Hospital Events: Including procedures, antibiotic start and stop dates in addition to other pertinent events   04/17: Patient admitted to [facility] for new laryngeal mass 04/18: Patient had a rapid called for worsening shortness of breath. Patient required to go to ICU closer monitoring.  4/18 emergent tracheostomy 4/20 added Unasyn  Interim History / Subjective:   He still has cough with sputum production Remains on 28% FiO2 with humidity Afebrile  Objective   Blood pressure (!) 117/52, pulse 76, temperature 98.4 F (36.9 C), temperature source Oral, resp. rate 20, height  (1.803 m), weight 58.6 kg, SpO2 98 %.    FiO2 (%):  [28 %] 28 %   Intake/Output Summary (Last 24 hours) at 12/13/2022 1204 Last data filed at  12/13/2022 0300 Gross per 24 hour  Intake 1400 ml  Output 350 ml  Net 1050 ml    Filed Weights   12/09/22 0518 12/09/22 1508  Weight: 68 kg 58.6 kg   Examination: General: Resting in bed in no acute distress  HENT: Normocephalic atraumatic  Lungs: No accessory muscle use, decreased breath sounds bilateral, decreased chest wall crepitus Cardiovascular: RRR, no murmurs rubs or gallops Abdomen: Soft, non distended with normoactive bowel sounds  Extremities: No deformity, no edema Neuro: Alert, Interactive, able to communicate by writing  Labs show mild hyponatremia, decreased leukocytosis  Chest x-ray shows unchanged right apical pneumothorax  Resolved Hospital Problem list     Assessment & Plan:  This is a 72 year old male with past medical history of hypertension, COPD, hyperlipidemia who presents emergency department with concerns of shortness of breath.  Patient found to have laryngeal mass, and required emergent tracheostomy   #Laryngeal cancer #Upper airway obstruction s/p tracheostomy   -Continue to monitor respiratory status -Continue with trach collar, can decrease FiO2, continue humidity -ENT to change Trach on Monday 12/14/22 - teach trach care  Right apical pneumothorax Subcutaneous emphysema -Follow-up daily chest x-ray until resolved, unchanged and do not expect to worsen  #History of COPD  -Budesonide/Brovana -Since he has copious secretions, will treat with Unasyn , follow respiratory culture and tailor  #Type 1 diabetes Patient has a history of type 1 diabetes mellitus.  Patient's glucose has been well-controlled at this point.  Goal glucose between 140-180.  Diabetes coordinator to evaluate patient as well. -  Continue insulin pump -Continue monitor blood sugars closely   #Left adrenal adenoma -Follow-up on PET scan  #PVD status post left BKA #CAD # Hypertension Per TRH -Continue aspirin, metoprolol, Crestor  Updated daughters at bedside and  discussed with TRH.  He will need education about trach care and awaiting ENT/oncology to outline plan of care PCCM will be available as needed  Best Practice (right click and "Reselect all SmartList Selections" daily)    Code Status:  full code  Labs   CBC: Recent Labs  Lab 12/09/22 0535 12/10/22 0246 12/11/22 0055 12/12/22 0134  WBC 12.0* 9.4 18.4* 14.7*  NEUTROABS  --   --  17.2*  --   HGB 13.6 12.0* 12.7* 13.2  HCT 39.7 33.1* 36.6* 37.2*  MCV 95.4 91.4 93.8 93.0  PLT 382 343 349 335     Basic Metabolic Panel: Recent Labs  Lab 12/09/22 0535 12/10/22 0246 12/11/22 0055 12/12/22 0134  NA 135 132* 136 133*  K 4.1 4.5 4.8 4.4  CL 97* 99 101 97*  CO2 GLUCOSE 183* 301* 150* 165*  BUN 19 24* 24* 26*  CREATININE 0.79 0.89 0.81 0.99  CALCIUM 10.0 8.9 8.7* 8.6*    GFR: Estimated Creatinine Clearance: 57.5 mL/min (by C-G formula based on SCr of 0.99 mg/dL). Recent Labs  Lab 12/09/22 0535 12/10/22 0246 12/11/22 0055 12/12/22 0134  WBC 12.0* 9.4 18.4* 14.7*     Liver Function Tests: Recent Labs  Lab 12/09/22 0535 12/10/22 0246  AST 28 22  ALT 24 24  ALKPHOS 71 70  BILITOT 0.7 0.6  PROT 7.0 5.8*  ALBUMIN 3.9 3.1*    No results for input(s): "LIPASE", "AMYLASE" in the last 168 hours. No results for input(s): "AMMONIA" in the last 168 hours.  ABG    Component Value Date/Time   HCO3 26.5 12/09/2022 0607   TCO2 28 11/14/2019 1026   O2SAT 81.4 12/09/2022 0607     Coagulation Profile: No results for input(s): "INR", "PROTIME" in the last 168 hours.  Cardiac Enzymes: No results for input(s): "CKTOTAL", "CKMB", "CKMBINDEX", "TROPONINI" in the last 168 hours.  HbA1C: Hgb A1c MFr Bld  Date/Time Value Ref Range Status  12/09/2022 05:34 AM 8.0 (H) 4.8 - 5.6 % Final    Comment:    (NOTE) Pre diabetes:          5.7%-6.4%  Diabetes:              >6.4%  Glycemic control for   <7.0% adults with diabetes   11/14/2019 03:55 PM 8.0 (H)  4.8 - 5.6 % Final    Comment:    (NOTE) Pre diabetes:          5.7%-6.4% Diabetes:              >6.4% Glycemic control for   <7.0% adults with diabetes     CBG: Recent Labs  Lab 12/12/22 1627 12/12/22 2102 12/13/22 0311 12/13/22 0542 12/13/22 1108  GLUCAP 197* 212* 147* 100* 215*    Cyril Mourning MD. FCCP. [facility] Pager : [phone]  If no response to pager , please call [phone] until 7 pm After 7:00 pm call Elink  [phone]   12/13/2022

## 2022-12-14 ENCOUNTER — Telehealth: Payer: Self-pay | Admitting: Pulmonary Disease

## 2022-12-14 DIAGNOSIS — E1151 Type 2 diabetes mellitus with diabetic peripheral angiopathy without gangrene: Secondary | ICD-10-CM | POA: Diagnosis not present

## 2022-12-14 DIAGNOSIS — C329 Malignant neoplasm of larynx, unspecified: Secondary | ICD-10-CM | POA: Diagnosis not present

## 2022-12-14 DIAGNOSIS — I10 Essential (primary) hypertension: Secondary | ICD-10-CM | POA: Diagnosis not present

## 2022-12-14 DIAGNOSIS — R0602 Shortness of breath: Secondary | ICD-10-CM

## 2022-12-14 LAB — CBC WITH DIFFERENTIAL/PLATELET
Abs Immature Granulocytes: 0.02 10*3/uL (ref 0.00–0.07)
Basophils Absolute: 0 10*3/uL (ref 0.0–0.1)
Basophils Relative: 0 %
Eosinophils Absolute: 0.1 10*3/uL (ref 0.0–0.5)
Eosinophils Relative: 1 %
HCT: 32.6 % — ABNORMAL LOW (ref 39.0–52.0)
Hemoglobin: 11.4 g/dL — ABNORMAL LOW (ref 13.0–17.0)
Immature Granulocytes: 0 %
Lymphocytes Relative: 12 %
Lymphs Abs: 0.9 10*3/uL (ref 0.7–4.0)
MCH: 32.6 pg (ref 26.0–34.0)
MCHC: 35 g/dL (ref 30.0–36.0)
MCV: 93.1 fL (ref 80.0–100.0)
Monocytes Absolute: 1 10*3/uL (ref 0.1–1.0)
Monocytes Relative: 13 %
Neutro Abs: 5.8 10*3/uL (ref 1.7–7.7)
Neutrophils Relative %: 74 %
Platelets: 275 10*3/uL (ref 150–400)
RBC: 3.5 MIL/uL — ABNORMAL LOW (ref 4.22–5.81)
RDW: 13.3 % (ref 11.5–15.5)
WBC: 7.9 10*3/uL (ref 4.0–10.5)
nRBC: 0 % (ref 0.0–0.2)

## 2022-12-14 LAB — GLUCOSE, CAPILLARY
Glucose-Capillary: 244 mg/dL — ABNORMAL HIGH (ref 70–99)
Glucose-Capillary: 278 mg/dL — ABNORMAL HIGH (ref 70–99)
Glucose-Capillary: 378 mg/dL — ABNORMAL HIGH (ref 70–99)
Glucose-Capillary: 345 mg/dL — ABNORMAL HIGH (ref 70–99)

## 2022-12-14 LAB — CULTURE, RESPIRATORY W GRAM STAIN: Culture: NORMAL

## 2022-12-14 LAB — RENAL FUNCTION PANEL
Albumin: 2.2 g/dL — ABNORMAL LOW (ref 3.5–5.0)
Anion gap: 11 (ref 5–15)
BUN: 24 mg/dL — ABNORMAL HIGH (ref 8–23)
CO2: 27 mmol/L (ref 22–32)
Calcium: 8.2 mg/dL — ABNORMAL LOW (ref 8.9–10.3)
Chloride: 97 mmol/L — ABNORMAL LOW (ref 98–111)
Creatinine, Ser: 0.8 mg/dL (ref 0.61–1.24)
GFR, Estimated: >60 mL/min (ref >60)
Glucose, Bld: 238 mg/dL — ABNORMAL HIGH (ref 70–99)
Phosphorus: 2.6 mg/dL (ref 2.5–4.6)
Potassium: 3.7 mmol/L (ref 3.5–5.1)
Sodium: 135 mmol/L (ref 135–145)

## 2022-12-14 LAB — SURGICAL PATHOLOGY

## 2022-12-14 NOTE — Care Management Important Message (Signed)
Important Message  Patient Details  Name: [name] MRN: [id number] Date of Birth: 06-03-1951   Medicare Important Message Given:  Yes     Renie Ora 12/14/2022, 11:02 AM

## 2022-12-14 NOTE — Progress Notes (Addendum)
Speech Language Pathology  Patient Details Name: [name] MRN: [id number] DOB: [birth year] Today's Date: 12/14/2022 Time:  -     Will assess for PMV once trach changed to cuffless per ENT note (notes planned for today). Swallow assessment to be completed after PMV evaluation.                                 [name]  12/14/2022, 10:15 AM

## 2022-12-14 NOTE — Telephone Encounter (Signed)
Please schedule hospital follow-up in 4 weeks with APP New diagnosis of laryngeal cancer status post tracheostomy, COPD/right apical pneumothorax

## 2022-12-14 NOTE — Telephone Encounter (Signed)
[facility] appt has been scheduled for pt with Rubye Oaks, NP. Appt will show up on pt's AVS when pt is discharged from the hospital. If appt needs to be changed, pt can call the office to get appt rescheduled. Nothing further needed.

## 2022-12-14 NOTE — Progress Notes (Signed)
NAME:  ESTABAN MAINVILLE, MRN:  [id number], DOB:  15-Dec-1950, LOS: 4 ADMISSION DATE:  12/09/2022, CONSULTATION DATE:  12/10/2022 REFERRING MD:  Dr. Joen Laura, CHIEF COMPLAINT:  Shortness of Breath    History of Present Illness:  This is a 72 year old male with a past medical history of emphysema, CAD, Type 1 diabetes, HLD, HTN who presented to the emergency department with concerns of shortness of breath.  Initial vital signs were temperature 97.4 F, pulse 107, respirations 23, BP 158/78 mmHg O2 sat 95% on room air. Initially thought to be a COPD exacerbation. Upon further examination there was concern for upper airway obstruction.   The patient received a continuous albuterol 10+ ipratropium 0.5 mg neb, an albuterol 2.5 mg nebs, DuoNeb, magnesium sulfate 2 g IVPB and 10 mg of dexamethasone IVP. Patient initially admitted to the floor for further evaluation and management for new laryngeal mass. On 12/10/2022 patient had worsening shortness of breath and rapid response was called. Patient required to go onto BiPAP. PCCM consulted for ICU admit for further evaluation and management.    Pertinent Medical History:  Emphysema, CAD, Tye 1 DM, HLD, HTN, PVD  Significant Hospital Events: Including procedures, antibiotic start and stop dates in addition to other pertinent events   04/17 - Patient admitted to [facility] for new laryngeal mass 04/18 - Patient had a rapid called for worsening shortness of breath. Patient required to go to ICU closer monitoring.  4/18 - Emergent tracheostomy 4/20 - Added Unasyn 4/22 - Significant secretion burden. SLP consult in place, added PMV eval once appropriate. ?ENT trach change to cuffless today, per note 4/19.  Interim History / Subjective:  Feeling ok today, seems frustrated - says no one is coming in to help suction him Suctioned at bedside with NTS cannula, gauze changed States it hurts when he coughs, getting up thick, yellow-Heine  mucous/secretions Remains on ATC, tolerating well Per ENT note 4/19, ?trach change to cuffless today SLP eval in place, added PMV evaluation  Objective   Blood pressure (!) 117/53, pulse 66, temperature 98.3 F (36.8 C), temperature source Oral, resp. rate 20, height  (1.803 m), weight 58.6 kg, SpO2 94 %.    FiO2 (%):  [21 %-28 %] 21 %   Intake/Output Summary (Last 24 hours) at 12/14/2022 0910 Last data filed at 12/14/2022 0700 Gross per 24 hour  Intake 980.06 ml  Output 1050 ml  Net -69.94 ml    Filed Weights   12/09/22 0518 12/09/22 1508  Weight: 68 kg 58.6 kg   Physical Examination: General: Acute-on-chronically ill-appearing older man in NAD. Sitting up in bed, communicating by mouthing words. HEENT: /AT, anicteric sclera, PERRL, moist mucous membranes. Neck: Tracheostomy in place, midline with copious moderate-thick yellow-Deman secretions from trach. Same obtained with NTS suctioning. Neuro: Awake, oriented x 4. Responds to verbal stimuli. Following commands consistently. Moves all 4 extremities spontaneously.  CV: RRR, no m/g/r. PULM: Breathing even and unlabored on ATC. Lung fields with scattered rhonchi bilaterally, more pronounced in upper fields. GI: Soft, nontender, nondistended. Normoactive bowel sounds. Extremities: No LE edema noted. +L BKA. Skin: Warm/dry, no rashes.  Resolved Hospital Problem List:    Assessment & Plan:  72 year old male with past medical history of hypertension, COPD, hyperlipidemia who presents emergency department with concerns of shortness of breath.  Patient found to have laryngeal mass, and required emergent tracheostomy  #Laryngeal cancer #Upper airway obstruction s/p tracheostomy  - Continue to monitor respiratory status, seems to be  improving - ATC, decrease FiO2 as tolerated - Continue humidity to mobilize secretions - Pulmonary hygiene - ENT to change trach today to cuffless - Trach care per protocol, will need teaching  regarding trach care - SLP/PMV evaluation - F/u laryngeal mass pathology  Right apical pneumothorax Subcutaneous emphysema - Intermittent CXR, no change in clinical status today 4/22  #History of COPD #Possible CAP versus aspiration - Continue Brovana, budesonide - Unasyn for copious secretions - Follow Resp Cx/TA, narrow abx as able  #Type 1 diabetes Patient has a history of type 1 diabetes mellitus.  Patient's glucose has been well-controlled at this point.  Goal glucose between 140-180.  Diabetes coordinator to evaluate patient as well. - Continue insulin pump - Monitor CBGs closely  #Left adrenal adenoma - F/u on PET  #PVD status post left BKA #CAD # Hypertension Per TRH - Continue ASA, metoprolol, Crestor  Best Practice (right click and "Reselect all SmartList Selections" daily)   Code Status:  full code  Signature:   Tim Lair, PA-C [facility] 12/14/22 9:13 AM  Please see [email or web address] for pager details.  From 7A-7P if no response, please call [phone] After hours, please call ELink [phone]

## 2022-12-14 NOTE — Progress Notes (Signed)
RT was called to get a #4 cuffless trach for 4E12 so that ENT could change it out. RT called and verified w/ ENT on what he wanted/needed at the bedside. RT was then called again by 4E staff saying that the wrong trach was obtained. ENT contacted by RT over the phone asking what supplies were not correct and he stated he wanted a #6 cuffless not a #4 so Respiratory could change the trach. I informed him when it is an emergent trach put in by ENT that we are not the ones who change it out and he stated the pt would go home with the #6 shiley cuffed trach because he was not making a 3rd trip up there.

## 2022-12-14 NOTE — Progress Notes (Signed)
[facility] PROGRESS NOTE    Progress Note  [name]  NWG:[id number] DOB: 18-Sep-1950 DOA: 12/09/2022 PCP: [name], MD     Brief Narrative:   [name] is an 72 y.o. male past medical history of diabetes mellitus type 1, essential hypertension, status below the knee amputation ongoing tobacco abuse presents to the ED with stridor difficulty breathing imaging showed vocal cord lesions ENT was consulted perform laryngoscopy with biopsy status post tracheostomy try resume care on 12/12/2022   Assessment/Plan:   Laryngeal mass/suspected laryngeal cancer/upper airway obstruction status post tracheostomy: Status post laryngoscopy with biopsy which is pending. Trach in place. ENT and ICU following. Further management per biopsy results. Hopefully for trach change today  Right apical pneumothorax/subcutaneous emphysema: Resolved on chest x-ray.  Recently diagnosed with COPD: Continue inhalers and antibiotics.  Diabetes mellitus type 1: A1c of 8.0, patient on an insulin pump, which he is managing blood glucose this morning is 244.  Left adrenal mass: May need workup as an outpatient.  PVD: Status post left BKA.  CAD: Stable.  Essential hypertension: Blood pressure relative control continue current management.  Stage II pretibial ulcer present on admission RN Pressure Injury Documentation: Pressure Injury 12/11/22 Pretibial Left;Proximal Stage 2 -  Partial thickness loss of dermis presenting as a shallow open injury with a red, pink wound bed without slough. (Active)  12/11/22 1053  Location: Pretibial  Location Orientation: Left;Proximal  Staging: Stage 2 -  Partial thickness loss of dermis presenting as a shallow open injury with a red, pink wound bed without slough.  Wound Description (Comments):   Present on Admission: Yes  Dressing Type None 12/13/22 2100    DVT prophylaxis: lovenox Family Communication:none Status is: Inpatient Remains  inpatient appropriate because: Acute hypoxic respiratory failure    Code Status:     Code Status Orders  (From admission, onward)           Start     Ordered   12/09/22 1127  Full code  Continuous       Question:  By:  Answer:  Consent: discussion documented in EHR   12/09/22 1127           Code Status History     Date Active Date Inactive Code Status Order ID Comments User Context   11/14/2019 1501 11/17/2019 1554 Full Code [id number]  Nada Libman, MD Inpatient   03/02/2016 2011 03/04/2016 1822 Full Code [id number]  Nadara Mustard, MD Inpatient   07/31/2014 1329 07/31/2014 1830 Full Code [id number]  Yates Decamp, MD Inpatient   05/29/2014 1347 05/29/2014 2249 Full Code [id number]  Yates Decamp, MD Inpatient   01/23/2014 1343 01/23/2014 2027 Full Code [id number]  Yates Decamp, MD Inpatient         IV Access:   Peripheral IV   Procedures and diagnostic studies:   DG Chest Port 1 View  Result Date: 12/13/2022 CLINICAL DATA:  Follow-up pneumothorax. EXAM: PORTABLE CHEST 1 VIEW COMPARISON:  Multiple recent chest x-rays. FINDINGS: The tracheostomy tube is stable. Small residual right apical pneumothorax, less than 5%. Persistent but improving subcutaneous emphysema and pneumomediastinum. Stable advanced emphysematous changes and pulmonary scarring. IMPRESSION: 1. Small residual right apical pneumothorax, less than 5%. 2. Persistent but improving subcutaneous emphysema and pneumomediastinum. Electronically Signed   By: Rudie Meyer M.D.   On: 12/13/2022 09:10     Medical Consultants:   None.   Subjective:    [name] no complaints not in a good mood  Objective:    Vitals:   12/13/22 2335 12/14/22 0311 12/14/22 0801 12/14/22 0823  BP:    (!) 117/53  Pulse: 70 78 78 66  Resp: 18 18 20 20   Temp:    98.3 F (36.8 C)  TempSrc:    Oral  SpO2: 94% 94% 94% 94%  Weight:      Height:       SpO2: 94 % O2 Flow Rate (L/min): 6 L/min FiO2 (%): 21 %   Intake/Output  Summary (Last 24 hours) at 12/14/2022 0834 Last data filed at 12/14/2022 0700 Gross per 24 hour  Intake 980.06 ml  Output 1050 ml  Net -69.94 ml   Filed Weights   12/09/22 0518 12/09/22 1508  Weight: 68 kg 58.6 kg    Exam: General exam: In no acute distress. Respiratory system: Good air movement and clear to auscultation. Cardiovascular system: S1 & S2 heard, RRR. No JVD. Gastrointestinal system: Abdomen is nondistended, soft and nontender.  Extremities: No pedal edema. Skin: No rashes, lesions or ulcers Psychiatry: Judgement and insight appear normal. Mood & affect appropriate.    Data Reviewed:    Labs: Basic Metabolic Panel: Recent Labs  Lab 12/10/22 0246 12/11/22 0055 12/12/22 0134 12/13/22 1243 12/14/22 0133  NA 132* 136 133* 136 135  K 4.5 4.8 4.4 3.9 3.7  CL 99 101 97* 99 97*  CO2 26 25 27 26 27   GLUCOSE 301* 150* 165* 177* 238*  BUN 24* 24* 26* 27* 24*  CREATININE 0.89 0.81 0.99 1.00 0.80  CALCIUM 8.9 8.7* 8.6* 8.4* 8.2*  PHOS  --   --   --  2.6 2.6   GFR Estimated Creatinine Clearance: 71.2 mL/min (by C-G formula based on SCr of 0.8 mg/dL). Liver Function Tests: Recent Labs  Lab 12/09/22 0535 12/10/22 0246 12/13/22 1243 12/14/22 0133  AST 28 22  --   --   ALT 24 24  --   --   ALKPHOS 71 70  --   --   BILITOT 0.7 0.6  --   --   PROT 7.0 5.8*  --   --   ALBUMIN 3.9 3.1* 2.5* 2.2*   No results for input(s): "LIPASE", "AMYLASE" in the last 168 hours. No results for input(s): "AMMONIA" in the last 168 hours. Coagulation profile No results for input(s): "INR", "PROTIME" in the last 168 hours. COVID-19 Labs  No results for input(s): "DDIMER", "FERRITIN", "LDH", "CRP" in the last 72 hours.  Lab Results  Component Value Date   SARSCOV2NAA NEGATIVE 12/10/2022   SARSCOV2NAA NEGATIVE 11/27/2022   SARSCOV2NAA NEGATIVE 11/13/2019    CBC: Recent Labs  Lab 12/10/22 0246 12/11/22 0055 12/12/22 0134 12/13/22 1243 12/14/22 0133  WBC 9.4 18.4*  14.7* 11.2* 7.9  NEUTROABS  --  17.2*  --  9.0* 5.8  HGB 12.0* 12.7* 13.2 12.0* 11.4*  HCT 33.1* 36.6* 37.2* 35.0* 32.6*  MCV 91.4 93.8 93.0 94.9 93.1  PLT 343 349 335 295 275   Cardiac Enzymes: No results for input(s): "CKTOTAL", "CKMB", "CKMBINDEX", "TROPONINI" in the last 168 hours. BNP (last 3 results) No results for input(s): "PROBNP" in the last 8760 hours. CBG: Recent Labs  Lab 12/13/22 0542 12/13/22 1108 12/13/22 1626 12/13/22 2119 12/14/22 0609  GLUCAP 100* 215* 208* 374* 244*   D-Dimer: No results for input(s): "DDIMER" in the last 72 hours. Hgb A1c: No results for input(s): "HGBA1C" in the last 72 hours. Lipid Profile: No results for input(s): "CHOL", "HDL", "LDLCALC", "TRIG", "CHOLHDL", "LDLDIRECT"  in the last 72 hours. Thyroid function studies: No results for input(s): "TSH", "T4TOTAL", "T3FREE", "THYROIDAB" in the last 72 hours.  Invalid input(s): "FREET3" Anemia work up: No results for input(s): "VITAMINB12", "FOLATE", "FERRITIN", "TIBC", "IRON", "RETICCTPCT" in the last 72 hours. Sepsis Labs: Recent Labs  Lab 12/11/22 0055 12/12/22 0134 12/13/22 1243 12/14/22 0133  WBC 18.4* 14.7* 11.2* 7.9   Microbiology Recent Results (from the past 240 hour(s))  Respiratory (~20 pathogens) panel by PCR     Status: None   Collection Time: 12/09/22  3:29 PM   Specimen: Nasopharyngeal Swab; Respiratory  Result Value Ref Range Status   Adenovirus NOT DETECTED NOT DETECTED Final   Coronavirus 229E NOT DETECTED NOT DETECTED Final    Comment: (NOTE) The Coronavirus on the Respiratory Panel, DOES NOT test for the novel  Coronavirus (2019 nCoV)    Coronavirus HKU1 NOT DETECTED NOT DETECTED Final   Coronavirus NL63 NOT DETECTED NOT DETECTED Final   Coronavirus OC43 NOT DETECTED NOT DETECTED Final   Metapneumovirus NOT DETECTED NOT DETECTED Final   Rhinovirus / Enterovirus NOT DETECTED NOT DETECTED Final   Influenza A NOT DETECTED NOT DETECTED Final   Influenza B  NOT DETECTED NOT DETECTED Final   Parainfluenza Virus 1 NOT DETECTED NOT DETECTED Final   Parainfluenza Virus 2 NOT DETECTED NOT DETECTED Final   Parainfluenza Virus 3 NOT DETECTED NOT DETECTED Final   Parainfluenza Virus 4 NOT DETECTED NOT DETECTED Final   Respiratory Syncytial Virus NOT DETECTED NOT DETECTED Final   Bordetella pertussis NOT DETECTED NOT DETECTED Final   Bordetella Parapertussis NOT DETECTED NOT DETECTED Final   Chlamydophila pneumoniae NOT DETECTED NOT DETECTED Final   Mycoplasma pneumoniae NOT DETECTED NOT DETECTED Final    Comment: Performed at [facility], [address], [place], [place] [address]  SARS Coronavirus 2 by RT PCR (hospital order, performed in [facility] hospital lab) *cepheid single result test* Anterior Nasal Swab     Status: None   Collection Time: 12/10/22  1:56 PM   Specimen: Anterior Nasal Swab  Result Value Ref Range Status   SARS Coronavirus 2 by RT PCR NEGATIVE NEGATIVE Final    Comment: Performed at [facility], [address], [place], [place] [address]  MRSA Next Gen by PCR, Nasal     Status: None   Collection Time: 12/10/22  6:18 PM   Specimen: Nasal Mucosa; Nasal Swab  Result Value Ref Range Status   MRSA by PCR Next Gen NOT DETECTED NOT DETECTED Final    Comment: (NOTE) The GeneXpert MRSA Assay (FDA approved for NASAL specimens only), is one component of a comprehensive MRSA colonization surveillance program. It is not intended to diagnose MRSA infection nor to guide or monitor treatment for MRSA infections. Test performance is not FDA approved in patients less than 23 years old. Performed at [facility], [address] [place], [place] [address]   Culture, Respiratory w Gram Stain     Status: None (Preliminary result)   Collection Time: 12/12/22  2:45 PM   Specimen: Tracheal Aspirate; Respiratory  Result Value Ref Range Status   Specimen Description TRACHEAL ASPIRATE  Final   Special Requests NONE  Final    Gram Stain   Final    RARE WBC PRESENT, PREDOMINANTLY PMN MODERATE GRAM NEGATIVE RODS FEW GRAM POSITIVE COCCI    Culture   Final    CULTURE REINCUBATED FOR BETTER GROWTH Performed at [facility], [address], [place], [place] [address]    Report Status PENDING  Incomplete     Medications:    arformoterol  15 mcg Nebulization BID   aspirin EC  325 mg Oral QHS   budesonide (PULMICORT) nebulizer solution  0.5 mg Nebulization BID   Chlorhexidine Gluconate Cloth  6 each Topical Daily   enoxaparin (LOVENOX) injection  40 mg Subcutaneous Q24H   folic acid  1,500 mcg Oral QHS   insulin pump   Subcutaneous TID WC, HS, 0200   ipratropium-albuterol  3 mL Nebulization BID   metoprolol succinate  25 mg Oral QHS   ramipril  5 mg Oral QHS   rosuvastatin  10 mg Oral QHS   Continuous Infusions:  ampicillin-sulbactam (UNASYN) IV 3 g (12/14/22 0429)      LOS: 4 days   Marinda Elk  [facility]  12/14/2022, 8:34 AM

## 2022-12-14 NOTE — Progress Notes (Signed)
Initial Nutrition Assessment  DOCUMENTATION CODES:   Severe malnutrition in context of chronic illness, Underweight  INTERVENTION:  Education on increased needs  Magic cup BID with meals, each supplement provides 290 kcal and 9 grams of protein Encourage po intake MVI   NUTRITION DIAGNOSIS:   Severe Malnutrition related to chronic illness, cancer and cancer related treatments as evidenced by per patient/family report, severe fat depletion, severe muscle depletion, moderate fat depletion, moderate muscle depletion.   GOAL:   Patient will meet greater than or equal to 90% of their needs   MONITOR:   PO intake, Labs, I & O's, Supplement acceptance, Weight trends, Skin  REASON FOR ASSESSMENT:   Malnutrition Screening Tool    ASSESSMENT:   72 y.o. male with PMHx including T1DM, HTN, CAD, PVD s/p L BKA, recent dx of COPD, tobacco abuse presents with difficulty breathing 2/2 vocal cord lesions which are concerning for malignancy  S/p laryngoscopy trach 4/20-awaiting biopsies  Labs: Glu 238. Hga1c 8 Meds: Unasyn, folvite, insulin pump, crestor Wt: 9.4 kg (14%) wt loss x 12 days?  PO: 85% avg meal intake x last 4 documented meals  I/O's: +642 mL   Visited patient at bedside who is nonverbal due to trach. Patient reports he has been eating well since admission. He c/o throat soreness and patient reports that nursing helps him make meal selections.  He endorses significant weight loss. Patient unable to elaborate due to trach. He declined protein shakes but is agreeable to magic cup while stressing his diabetes. RD explained to him how he requires more kcals and protein, and we have to figure out a way to incorporate this into his daily eating routine. RD stressed that his BG's will be controlled as we increase his nutrition intake.   He denies N/V/D/C, trouble chewing/swallowing, but does c/o throat soreness.  Patient reports he plans to d/c home in the next couple of days.    NUTRITION - FOCUSED PHYSICAL EXAM:  Flowsheet Row Most Recent Value  Orbital Region Moderate depletion  Upper Arm Region Severe depletion  Thoracic and Lumbar Region Moderate depletion  Buccal Region Severe depletion  Temple Region Moderate depletion  Clavicle Bone Region Severe depletion  Clavicle and Acromion Bone Region Severe depletion  Scapular Bone Region Unable to assess  Dorsal Hand Severe depletion  Patellar Region Severe depletion  Anterior Thigh Region Severe depletion  Posterior Calf Region Moderate depletion  Edema (RD Assessment) None  Hair Reviewed  Eyes Reviewed  Mouth Reviewed  Skin Reviewed  Nails Reviewed       Diet Order:   Diet Order             Diet Carb Modified Fluid consistency: Thin; Room service appropriate? Yes  Diet effective now                   EDUCATION NEEDS:   Education needs have been addressed  Skin:  Skin Assessment: Skin Integrity Issues: Skin Integrity Issues:: Stage II Stage II: L tibia  Last BM:  4/19  Height:   Ht Readings from Last 1 Encounters:  12/09/22  (1.803 m)    Weight:   Wt Readings from Last 1 Encounters:  12/09/22 58.6 kg     BMI:  Body mass index is 18.02 kg/m.  Estimated Nutritional Needs:   Kcal:  1800-2100 kcal  Protein:  90-120 g  Fluid:  >/= 2L    Leodis Rains, RDN, LDN  Clinical Nutrition

## 2022-12-14 NOTE — Progress Notes (Signed)
Patient seen on rounds.  Unable to change the tracheostomy to a noncuffed tube because the nursing staff was unable to obtain 1.  We discussed the pathology results.  This is a squamous cell cancer.  He is going to require PET scan evaluation, and referral to radiation and medical oncology.  Depending on the staging, treatment options were discussed briefly.  These include primary radiotherapy, chemo and radiation therapy, or total laryngectomy.

## 2022-12-14 NOTE — Evaluation (Signed)
Physical Therapy Evaluation Patient Details Name: [name] MRN: [id number] DOB: 03/19/1951 Today's Date: 12/14/2022  History of Present Illness  72 year old male presents to ED 4/17 after several days of gradually worsening SoB. 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 Developed stridor 4/18 and moved to ICU, tracheostomy and biopsy of laryngeal mass.  WUJ:WJXB 1 diabetes mellitus, recent COPD diagnosis, coronary artery disease, hyperlipidemia, hypertension and L BKA  Clinical Impression  PTA pt living with friend at her house while his trailer was being renovated, however he would like to return to his trailer on discharge. 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   Pt is currently very frustrated by his situation and is unsure what the plan is other than his trach is to be changed today. Pt able to don prosthetic, get up and transfer to recliner, requiring min guard for safety and mainly to manage wires. Pt will likely not have any PT needs at discharge. PT will continue to see acutely to progress mobility and will refer to Mobility Specialist.      Recommendations for follow up therapy are one component of a multi-disciplinary discharge planning process, led by the attending physician.  Recommendations may be updated based on patient status, additional functional criteria and insurance authorization.     Assistance Recommended at Discharge Intermittent Supervision/Assistance     Equipment Recommendations None recommended by PT  Recommendations for Other Services  OT consult    Functional Status Assessment Patient has had a recent decline in their functional status and demonstrates the ability to make significant improvements in function in a reasonable and predictable amount of time.     Precautions / Restrictions Precautions Precautions: Fall Precaution Comments: L BKA prosthetic Restrictions Weight Bearing Restrictions: No      Mobility  Bed Mobility Overal bed  mobility: Independent                  Transfers Overall transfer level: Needs assistance Equipment used: None Transfers: Bed to chair/wheelchair/BSC, Sit to/from Stand Sit to Stand: Min guard   Step pivot transfers: Min guard       General transfer comment: min guard for safety and management of trach collar and lines    Ambulation/Gait               General Gait Details: deferred as scheduled to have trach changed         Balance Overall balance assessment: Needs assistance Sitting-balance support: Feet supported, No upper extremity supported Sitting balance-Leahy Scale: Good     Standing balance support: No upper extremity supported, During functional activity Standing balance-Leahy Scale: Fair                               Pertinent Vitals/Pain Pain Assessment Pain Assessment: Faces Faces Pain Scale: Hurts little more Pain Location: generalized Pain Descriptors / Indicators: Grimacing, Guarding Pain Intervention(s): Limited activity within patient's tolerance, Monitored during session, Repositioned    Home Living Family/patient expects to be discharged to:: Private residence Living Arrangements: Alone Available Help at Discharge: Family;Available PRN/intermittently Type of Home: Mobile home Home Access: Ramped entrance       Home Layout: One level Home Equipment: Shower seat;Wheelchair - manual      Prior Function Prior Level of Function : Independent/Modified Independent             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  Hand Dominance   Dominant Hand: Right    Extremity/Trunk Assessment        Lower Extremity Assessment Lower Extremity Assessment: LLE deficits/detail LLE Deficits / Details: L BKA       Communication   Communication: Tracheostomy  Cognition Arousal/Alertness: Awake/alert Behavior During Therapy: WFL for tasks assessed/performed Overall Cognitive Status: Difficult to  assess                                          General Comments General comments (skin integrity, edema, etc.): Pt on 4L O2 via trach collar, VSS        Assessment/Plan    PT Assessment Patient needs continued PT services  PT Problem List Decreased strength;Decreased activity tolerance;Decreased mobility;Cardiopulmonary status limiting activity;Decreased safety awareness       PT Treatment Interventions Gait training;Functional mobility training;Stair training;Therapeutic activities;Therapeutic exercise;Balance training;Cognitive remediation;Patient/family education    PT Goals (Current goals can be found in the Care Plan section)  Acute Rehab PT Goals Patient Stated Goal: go to his newly renovated home PT Goal Formulation: With patient Time For Goal Achievement: 12/28/22 Potential to Achieve Goals: Good    Frequency Min 1X/week        AM-PAC PT "6 Clicks" Mobility  Outcome Measure Help needed turning from your back to your side while in a flat bed without using bedrails?: None Help needed moving from lying on your back to sitting on the side of a flat bed without using bedrails?: None Help needed moving to and from a bed to a chair (including a wheelchair)?: None Help needed standing up from a chair using your arms (e.g., wheelchair or bedside chair)?: None Help needed to walk in hospital room?: A Little Help needed climbing 3-5 steps with a railing? : A Little 6 Click Score: 22    End of Session Equipment Utilized During Treatment: Oxygen Activity Tolerance: Patient tolerated treatment well Patient left: in chair;with call bell/phone within reach Nurse Communication: Mobility status PT Visit Diagnosis: Unsteadiness on feet (R26.81);Difficulty in walking, not elsewhere classified (R26.2)    Time: 1610-9604 PT Time Calculation (min) (ACUTE ONLY): 44 min   Charges:   PT Evaluation $PT Eval Moderate Complexity: 1 Mod PT Treatments $Therapeutic  Activity: 23-37 mins        Tannen Vandezande B. Beverely Risen PT, DPT Acute Rehabilitation Services Please use secure chat or  Call Office [phone]   Elon Alas [facility] 12/14/2022, 2:02 PM

## 2022-12-15 ENCOUNTER — Inpatient Hospital Stay (HOSPITAL_COMMUNITY): Payer: Medicare Other

## 2022-12-15 DIAGNOSIS — J9601 Acute respiratory failure with hypoxia: Secondary | ICD-10-CM | POA: Diagnosis not present

## 2022-12-15 DIAGNOSIS — C329 Malignant neoplasm of larynx, unspecified: Secondary | ICD-10-CM | POA: Diagnosis not present

## 2022-12-15 DIAGNOSIS — E1151 Type 2 diabetes mellitus with diabetic peripheral angiopathy without gangrene: Secondary | ICD-10-CM | POA: Diagnosis not present

## 2022-12-15 DIAGNOSIS — E43 Unspecified severe protein-calorie malnutrition: Secondary | ICD-10-CM | POA: Insufficient documentation

## 2022-12-15 DIAGNOSIS — I10 Essential (primary) hypertension: Secondary | ICD-10-CM | POA: Diagnosis not present

## 2022-12-15 LAB — GLUCOSE, CAPILLARY
Glucose-Capillary: 200 mg/dL — ABNORMAL HIGH (ref 70–99)
Glucose-Capillary: 233 mg/dL — ABNORMAL HIGH (ref 70–99)
Glucose-Capillary: 198 mg/dL — ABNORMAL HIGH (ref 70–99)
Glucose-Capillary: 352 mg/dL — ABNORMAL HIGH (ref 70–99)
Glucose-Capillary: 313 mg/dL — ABNORMAL HIGH (ref 70–99)
Glucose-Capillary: 358 mg/dL — ABNORMAL HIGH (ref 70–99)

## 2022-12-15 MED ORDER — AMOXICILLIN-POT CLAVULANATE 600-42.9 MG/5ML PO SUSR
600.0000 mg | Freq: Two times a day (BID) | ORAL | Status: DC
  Administered 2022-12-15 – 2022-12-16 (×4): 600 mg via ORAL
  Filled 2022-12-15 (×6): qty 5

## 2022-12-15 MED ORDER — AMOXICILLIN-POT CLAVULANATE 875-125 MG PO TABS: 1.0000 | ORAL_TABLET | Freq: Two times a day (BID) | ORAL | Status: DC

## 2022-12-15 NOTE — Progress Notes (Signed)
[facility] PROGRESS NOTE    Progress Note  [name]  WUJ:[id number] DOB: [birth year] DOA: 12/09/2022 PCP: [name], MD     Brief Narrative:   [name] is an 72 y.o. 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 Trach in place. ENT and ICU following. Biopsies showed squamous cell carcinoma ENT cannot exchange cuffless tube yesterday as nursing staff was unable to obtain 1. Awaiting ENT further recommendations. Consult oncology  Right apical pneumothorax/subcutaneous emphysema: Resolved on chest x-ray. PCCM has signed off follow-up with them in 4 to 6 weeks.  Recently diagnosed with COPD: Continue inhalers and antibiotics.  Diabetes mellitus type 1: A1c of 8.0, patient on an insulin pump, which he is managing blood glucose this morning is 244.  Left adrenal mass: May need workup as an outpatient.  PVD: Status post left BKA.  CAD: Stable.  Essential hypertension: Blood pressure relative control continue current management.  Stage II pretibial ulcer present on admission RN Pressure Injury Documentation: Pressure Injury 12/11/22 Pretibial Left;Proximal Stage 2 -  Partial thickness loss of dermis presenting as a shallow open injury with a red, pink wound bed without slough. (Active)  12/11/22 1053  Location: Pretibial  Location Orientation: Left;Proximal  Staging: Stage 2 -  Partial thickness loss of dermis presenting as a shallow open injury with a red, pink wound bed without slough.  Wound Description (Comments):    Present on Admission: Yes  Dressing Type None 12/14/22 1530    DVT prophylaxis: lovenox Family Communication:none Status is: Inpatient Remains inpatient appropriate because: Acute hypoxic respiratory failure    Code Status:     Code Status Orders  (From admission, onward)           Start     Ordered   12/09/22 1127  Full code  Continuous       Question:  By:  Answer:  Consent: discussion documented in EHR   12/09/22 1127           Code Status History     Date Active Date Inactive Code Status Order ID Comments User Context   11/14/2019 1501 11/17/2019 1554 Full Code [id number]  Nada Libman, MD Inpatient   03/02/2016 2011 03/04/2016 1822 Full Code [id number]  Nadara Mustard, MD Inpatient   07/31/2014 1329 07/31/2014 1830 Full Code [id number]  Yates Decamp, MD Inpatient   05/29/2014 1347 05/29/2014 2249 Full Code [id number]  Yates Decamp, MD Inpatient   01/23/2014 1343 01/23/2014 2027 Full Code [id number]  Yates Decamp, MD Inpatient         IV Access:   Peripheral IV   Procedures and diagnostic studies:   No results found.   Medical Consultants:   None.   Subjective:    [name] in a good mood this morning.  Objective:    Vitals:   12/15/22 0220 12/15/22 0304 12/15/22 0755 12/15/22 0800  BP:  119/69  124/69  Pulse: 63 (!) 58 (!) 58 (!) 54  Resp: (!) (!) 26  Temp:  98.5 F (36.9 C)    TempSrc:  Oral    SpO2: 93% 94% 97% 100%  Weight:      Height:       SpO2: 100 % O2 Flow Rate (L/min): 6 L/min FiO2 (%): 21 %   Intake/Output Summary (Last 24 hours) at 12/15/2022 0904 Last data filed at 12/15/2022 0416 Gross per 24 hour  Intake 1520 ml  Output 575 ml  Net 945 ml    Filed Weights   12/09/22 0518 12/09/22 1508  Weight: 68 kg 58.6 kg    Exam: General exam: In no acute distress, trach in place Respiratory system: Good air movement and clear to auscultation. Cardiovascular system: S1 & S2 heard, RRR. No JVD. Gastrointestinal  system: Abdomen is nondistended, soft and nontender.  Extremities: No pedal edema. Skin: No rashes, lesions or ulcers Psychiatry: Judgement and insight appear normal. Mood & affect appropriate.  Data Reviewed:    Labs: Basic Metabolic Panel: Recent Labs  Lab 12/10/22 0246 12/11/22 0055 12/12/22 0134 12/13/22 1243 12/14/22 0133  NA 132* 136 133* 136 135  K 4.5 4.8 4.4 3.9 3.7  CL 99 101 97* 99 97*  CO2 26 25 27 26 27   GLUCOSE 301* 150* 165* 177* 238*  BUN 24* 24* 26* 27* 24*  CREATININE 0.89 0.81 0.99 1.00 0.80  CALCIUM 8.9 8.7* 8.6* 8.4* 8.2*  PHOS  --   --   --  2.6 2.6    GFR Estimated Creatinine Clearance: 71.2 mL/min (by C-G formula based on SCr of 0.8 mg/dL). Liver Function Tests: Recent Labs  Lab 12/09/22 0535 12/10/22 0246 12/13/22 1243 12/14/22 0133  AST 28 22  --   --   ALT 24 24  --   --   ALKPHOS 71 70  --   --   BILITOT 0.7 0.6  --   --   PROT 7.0 5.8*  --   --   ALBUMIN 3.9 3.1* 2.5* 2.2*    No results for input(s): "LIPASE", "AMYLASE" in the last 168 hours. No results for input(s): "AMMONIA" in the last 168 hours. Coagulation profile No results for input(s): "INR", "PROTIME" in the last 168 hours. COVID-19 Labs  No results for input(s): "DDIMER", "FERRITIN", "LDH", "CRP" in the last 72 hours.  Lab Results  Component Value Date   SARSCOV2NAA NEGATIVE 12/10/2022   SARSCOV2NAA NEGATIVE 11/27/2022   SARSCOV2NAA NEGATIVE 11/13/2019    CBC: Recent Labs  Lab 12/10/22 0246 12/11/22 0055 12/12/22 0134 12/13/22 1243 12/14/22 0133  WBC 9.4 18.4* 14.7* 11.2* 7.9  NEUTROABS  --  17.2*  --  9.0* 5.8  HGB 12.0* 12.7* 13.2 12.0* 11.4*  HCT 33.1* 36.6* 37.2* 35.0* 32.6*  MCV 91.4 93.8 93.0 94.9 93.1  PLT 343 349 335 295 275    Cardiac Enzymes: No results for input(s): "CKTOTAL", "CKMB", "CKMBINDEX", "TROPONINI" in the last 168 hours. BNP (last 3 results) No results for input(s): "PROBNP" in the last 8760 hours. CBG: Recent Labs  Lab  12/14/22 1703 12/14/22 2158 12/15/22 0302 12/15/22 0343 12/15/22 0609  GLUCAP 378* 345* 200* 233* 198*    D-Dimer: No results for input(s): "DDIMER" in the last 72 hours. Hgb A1c: No results for input(s): "HGBA1C" in the last 72 hours. Lipid Profile: No results for input(s): "CHOL", "HDL", "LDLCALC", "TRIG", "CHOLHDL", "LDLDIRECT" in the last 72 hours. Thyroid function studies: No results for input(s): "TSH", "T4TOTAL", "T3FREE", "THYROIDAB" in the last 72 hours.  Invalid input(s): "FREET3" Anemia work up: No results for input(s): "VITAMINB12", "FOLATE", "FERRITIN", "TIBC", "IRON", "RETICCTPCT" in the last 72 hours. Sepsis Labs: Recent Labs  Lab 12/11/22  8119 12/12/22 0134 12/13/22 1243 12/14/22 0133  WBC 18.4* 14.7* 11.2* 7.9    Microbiology Recent Results (from the past 240 hour(s))  Respiratory (~20 pathogens) panel by PCR     Status: None   Collection Time: 12/09/22  3:29 PM   Specimen: Nasopharyngeal Swab; Respiratory  Result Value Ref Range Status   Adenovirus NOT DETECTED NOT DETECTED Final   Coronavirus 229E NOT DETECTED NOT DETECTED Final    Comment: (NOTE) The Coronavirus on the Respiratory Panel, DOES NOT test for the novel  Coronavirus (2019 nCoV)    Coronavirus HKU1 NOT DETECTED NOT DETECTED Final   Coronavirus NL63 NOT DETECTED NOT DETECTED Final   Coronavirus OC43 NOT DETECTED NOT DETECTED Final   Metapneumovirus NOT DETECTED NOT DETECTED Final   Rhinovirus / Enterovirus NOT DETECTED NOT DETECTED Final   Influenza A NOT DETECTED NOT DETECTED Final   Influenza B NOT DETECTED NOT DETECTED Final   Parainfluenza Virus 1 NOT DETECTED NOT DETECTED Final   Parainfluenza Virus 2 NOT DETECTED NOT DETECTED Final   Parainfluenza Virus 3 NOT DETECTED NOT DETECTED Final   Parainfluenza Virus 4 NOT DETECTED NOT DETECTED Final   Respiratory Syncytial Virus NOT DETECTED NOT DETECTED Final   Bordetella pertussis NOT DETECTED NOT DETECTED Final   Bordetella  Parapertussis NOT DETECTED NOT DETECTED Final   Chlamydophila pneumoniae NOT DETECTED NOT DETECTED Final   Mycoplasma pneumoniae NOT DETECTED NOT DETECTED Final    Comment: Performed at [facility], [address], [place], [place] [address]  SARS Coronavirus 2 by RT PCR (hospital order, performed in [facility] hospital lab) *cepheid single result test* Anterior Nasal Swab     Status: None   Collection Time: 12/10/22  1:56 PM   Specimen: Anterior Nasal Swab  Result Value Ref Range Status   SARS Coronavirus 2 by RT PCR NEGATIVE NEGATIVE Final    Comment: Performed at [facility], [address] [place], [place] [address]  MRSA Next Gen by PCR, Nasal     Status: None   Collection Time: 12/10/22  6:18 PM   Specimen: Nasal Mucosa; Nasal Swab  Result Value Ref Range Status   MRSA by PCR Next Gen NOT DETECTED NOT DETECTED Final    Comment: (NOTE) The GeneXpert MRSA Assay (FDA approved for NASAL specimens only), is one component of a comprehensive MRSA colonization surveillance program. It is not intended to diagnose MRSA infection nor to guide or monitor treatment for MRSA infections. Test performance is not FDA approved in patients less than 36 years old. Performed at [facility], [address], [place], [place] [address]   Culture, Respiratory w Gram Stain     Status: None   Collection Time: 12/12/22  2:45 PM   Specimen: Tracheal Aspirate; Respiratory  Result Value Ref Range Status   Specimen Description TRACHEAL ASPIRATE  Final   Special Requests NONE  Final   Gram Stain   Final    RARE WBC PRESENT, PREDOMINANTLY PMN MODERATE GRAM NEGATIVE RODS FEW GRAM POSITIVE COCCI    Culture   Final    MODERATE Normal respiratory flora-no Staph aureus or Pseudomonas seen Performed at [facility], [address] [place], [place] [address]    Report Status 12/14/2022 FINAL  Final     Medications:    arformoterol  15 mcg Nebulization BID   aspirin EC  325 mg  Oral QHS   budesonide (PULMICORT) nebulizer solution  0.5 mg Nebulization BID  Chlorhexidine Gluconate Cloth  6 each Topical Daily   enoxaparin (LOVENOX) injection  40 mg Subcutaneous Q24H   folic acid  1,500 mcg Oral QHS   insulin pump   Subcutaneous TID WC, HS, 0200   ipratropium-albuterol  3 mL Nebulization BID   metoprolol succinate  25 mg Oral QHS   ramipril  5 mg Oral QHS   rosuvastatin  10 mg Oral QHS   Continuous Infusions:  ampicillin-sulbactam (UNASYN) IV Stopped (12/15/22 0416)      LOS: 5 days   Marinda Elk  [facility]  12/15/2022, 9:04 AM

## 2022-12-15 NOTE — Evaluation (Signed)
Occupational Therapy Evaluation Patient Details Name: [name] MRN: [id number] DOB: 1951/06/17 Today's Date: 12/15/2022   History of Present Illness 71 year old male presents to ED 4/17 after several days of gradually worsening SoB. 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 Developed stridor 4/18 and moved to ICU, tracheostomy and biopsy of laryngeal mass.  GUY:QIHK 1 diabetes mellitus, recent COPD diagnosis, coronary artery disease, hyperlipidemia, hypertension and L BKA   Clinical Impression   Pt admitted for above dx, PTA patient reports living with friend who can provide 24/7 assist and being independent in bADLs/iADLs ambulating no AD. Patient currently demonstrating impaired balance and needs RW for support to assist with ambulation. Pt does require occasional rest breaks d/t low back pain following ambulation. Pt able to complete bedside bADLs with supervision. D/t dynamic balance impairments, OT will continue to follow patient acutely at a low frequency but will likely benefit from continued mobility with mobility specialists as well. No follow-up OT recommended at this time, DME recs may change pending patient progression.     Recommendations for follow up therapy are one component of a multi-disciplinary discharge planning process, led by the attending physician.  Recommendations may be updated based on patient status, additional functional criteria and insurance authorization.   Assistance Recommended at Discharge Set up Supervision/Assistance  Patient can return home with the following Assist for transportation;Assistance with cooking/housework;Help with stairs or ramp for entrance    Functional Status Assessment  Patient has had a recent decline in their functional status and demonstrates the ability to make significant improvements in function in a reasonable and predictable amount of time.  Equipment Recommendations  Other (comment) (TTB pending patient progression)     Recommendations for Other Services       Precautions / Restrictions Precautions Precautions: Fall Precaution Comments: L BKA prosthetic Restrictions Weight Bearing Restrictions: No      Mobility Bed Mobility Overal bed mobility: Modified Independent             General bed mobility comments: using bedrails to assist with sup<>sit    Transfers Overall transfer level: Needs assistance Equipment used: Rolling walker (2 wheels) Transfers: Sit to/from Stand Sit to Stand: Min guard                  Balance Overall balance assessment: Needs assistance Sitting-balance support: Feet supported, No upper extremity supported Sitting balance-Leahy Scale: Good     Standing balance support: No upper extremity supported, During functional activity Standing balance-Leahy Scale: Fair Standing balance comment: Stands statically without RW but balance is impaired                           ADL either performed or assessed with clinical judgement   ADL Overall ADL's : Needs assistance/impaired Eating/Feeding: Sitting;Independent   Grooming: Standing;Min guard   Upper Body Bathing: Sitting;Supervision/ safety   Lower Body Bathing: Min guard;Sitting/lateral leans   Upper Body Dressing : Sitting;Supervision/safety   Lower Body Dressing: Sitting/lateral leans;Supervision/safety   Toilet Transfer: Min guard;Ambulation   Toileting- Clothing Manipulation and Hygiene: Sit to/from stand;Minimal assistance   Tub/ Shower Transfer: Ambulation;Min guard   Functional mobility during ADLs: Rolling walker (2 wheels);Min guard       Vision         Perception     Praxis      Pertinent Vitals/Pain Pain Assessment Pain Assessment: Faces Faces Pain Scale: Hurts little more Pain Location: Throat Pain Descriptors / Indicators: Grimacing, Guarding,  Discomfort Pain Intervention(s): Limited activity within patient's tolerance, Monitored during session     Hand  Dominance Right   Extremity/Trunk Assessment Upper Extremity Assessment Upper Extremity Assessment: Overall WFL for tasks assessed   Lower Extremity Assessment Lower Extremity Assessment: Generalized weakness;LLE deficits/detail LLE Deficits / Details: L BKA       Communication Communication Communication: Tracheostomy   Cognition Arousal/Alertness: Awake/alert Behavior During Therapy: WFL for tasks assessed/performed Overall Cognitive Status: Difficult to assess                                       General Comments  Pt on 6L 02 via trach collar with hall level ambulation, VSS on RA    Exercises     Shoulder Instructions      Home Living Family/patient expects to be discharged to:: Private residence Living Arrangements: Non-relatives/Friends Available Help at Discharge: Family;Available 24 hours/day Type of Home: House Home Access: Ramped entrance (one handrail, Pt not sure which side)     Home Layout: One level     Bathroom Shower/Tub: Chief Strategy Officer: Handicapped height Bathroom Accessibility: Yes How Accessible: Accessible via walker Home Equipment: Grab bars - tub/shower;Grab bars - toilet;Wheelchair - manual;Cane - single Librarian, academic (2 wheels);Rollator (4 wheels)   Additional Comments: Pt reports he also owns a mobile home but he will not be going there upon DC. 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 ADLs Comments: independent in bADLs/iADLs        OT Problem List: Decreased strength;Decreased activity tolerance;Impaired balance (sitting and/or standing)      OT Treatment/Interventions: Self-care/ADL training;Therapeutic exercise;Therapeutic activities;Energy conservation;DME and/or AE instruction;Balance  training;Patient/family education    OT Goals(Current goals can be found in the care plan section) Acute Rehab OT Goals Patient Stated Goal: To go home OT Goal Formulation: With patient Time For Goal Achievement: 12/30/22 ADL Goals Pt Will Perform Grooming: standing;with supervision Pt Will Transfer to Toilet: with supervision;ambulating  OT Frequency: Min 1X/week    Co-evaluation              AM-PAC OT "6 Clicks" Daily Activity     Outcome Measure Help from another person eating meals?: None Help from another person taking care of personal grooming?: A Little Help from another person toileting, which includes using toliet, bedpan, or urinal?: A Little Help from another person bathing (including washing, rinsing, drying)?: A Little Help from another person to put on and taking off regular upper body clothing?: A Little Help from another person to put on and taking off regular lower body clothing?: A Little 6 Click Score: 19   End of Session Equipment Utilized During Treatment: Gait belt;Rolling walker (2 wheels);Oxygen Nurse Communication: Mobility status  Activity Tolerance: Patient tolerated treatment well Patient left: in bed;with call bell/phone within reach  OT Visit Diagnosis: Unsteadiness on feet (R26.81);Other abnormalities of gait and mobility (R26.89)                Time: 1610-9604 OT Time Calculation (min): 34 min Charges:  OT General Charges $OT Visit: 1 Visit OT Evaluation $OT Eval Moderate Complexity: 1 Mod OT Treatments $Therapeutic Activity: 8-22 mins  12/15/2022  AB, OTR/L  Acute Rehabilitation Services  Office: [phone]   Tristan [name] 12/15/2022, 9:24 AM

## 2022-12-15 NOTE — Progress Notes (Signed)
Modified Barium Swallow Study  Patient Details  Name: [name] MRN: [id number] Date of Birth: 09-22-1950  Today's Date: 12/15/2022  Modified Barium Swallow completed.  Full report located under Chart Review in the Imaging Section.  History of Present Illness 72 year old male presents to ED 4/17 after several days of gradually worsening SoB. 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 Developed stridor 4/18 and moved to ICU, tracheostomy and biopsy of laryngeal mass.  WUJ:WJXB 1 diabetes mellitus, recent COPD diagnosis, coronary artery disease, hyperlipidemia, hypertension and L BKA   Clinical Impression Pt's MBS completed without a PMV due to trach not yet changed to uncuffed prior to [facility] evaluation per ENT orders. He demonstrated mild oropharyngeal dysphagia marked by decreased oral transit with pt compensating by  thrusting/extending head to assist in oral transit and occasional minimal lingual residue. Pt noted to have reduced laryngeal elevation and epiglottic deflection resulting in penetration with thin barium before full closure could occur. Most penetration instances with thin were initially ejected from vestibule during the swallow however barium on upper 1/3 of epiglottis began to penetrate midway into vestiuble without awareness after the swallow. Pt then penetrated to vocal cords with thin. Gloved finger occlusion with cough helped mobilize penetrate to upper epiglottis. Chin tuck resulted in silent aspiration and right head turn was ineffective. Nectar thick penetrated to the cords and finger occlusion with cough moved barium higher in vestibule. Mastication and transit of solid was functional. Recommend pt continue regular texture, thin liquid with gloved finger occlusion and strong cough after every 2-3 sips liquid, pills whole in puree. Pt is being worked up for laryngeal mass and treatment plan is unknown to this SLP. Recommend follow up with home health ST for continuation of strategies  and assist during treatment process. Factors that may increase risk of adverse event in presence of aspiration Rubye Oaks & Clearance Coots 2021): Presence of tubes (ETT, trach, NG, etc.)  Swallow Evaluation Recommendations Recommendations: PO diet PO Diet Recommendation: Regular;Thin liquids (Level 0) Liquid Administration via: Cup Medication Administration: Whole meds with puree Supervision: Patient able to self-feed Swallowing strategies  : Slow rate;Small bites/sips;Multiple dry swallows after each bite/sip (finger occlude trach and cough) Postural changes: Position pt fully upright for meals Oral care recommendations: Oral care BID (2x/day)      [name] 12/15/2022,2:47 PM

## 2022-12-15 NOTE — Inpatient Diabetes Management (Signed)
Inpatient Diabetes Program Recommendations  AACE/ADA: New Consensus Statement on Inpatient Glycemic Control (2015)  Target Ranges:  Prepandial:   less than 140 mg/dL      Peak postprandial:   less than 180 mg/dL (1-2 hours)      Critically ill patients:  140 - 180 mg/dL   Lab Results  Component Value Date   GLUCAP 198 (H) 12/15/2022   HGBA1C 8.0 (H) 12/09/2022    Noticed glucose trends are elevated the last couple of days. I went to see pt to inquire if there is anything different he is eating and to see how he is bolusing for his glucose. Pt has had Diabetes for 55 years. Pt reports that his glucose is being checked after he eats his meals. Pt is on a unit that does room service and has room trays that are able to be delivered at various times. Pts CBG checks are scheduled at specific times that do not correlate with when pt orders meal tray. Pt reports possibly being discharged tomorrow. I spoke with nursing staff regarding glucose checks an meal tray timing.  Thanks,  Christena Deem RN, MSN, BC-ADM Inpatient Diabetes Coordinator Team Pager [phone] (8a-5p)

## 2022-12-15 NOTE — Evaluation (Signed)
Clinical/Bedside Swallow Evaluation Patient Details  Name: [name] MRN: [id number] Date of Birth: 1951-03-07  Today's Date: 12/15/2022 Time: SLP Start Time (ACUTE ONLY): 1191 SLP Stop Time (ACUTE ONLY): 0945 SLP Time Calculation (min) (ACUTE ONLY): 22 min  Past Medical History:  Past Medical History:  Diagnosis Date   Cigarette smoker    Coronary artery disease    Diabetes mellitus    type 1  x 50 yrs   Diabetic retinopathy    Dyslipidemia    Hypertension    Peripheral vascular disease    Past Surgical History:  Past Surgical History:  Procedure Laterality Date   ABDOMINAL ANGIOGRAM N/A 05/29/2014   Procedure: ABDOMINAL ANGIOGRAM;  Surgeon: Pamella Pert, MD;  Location: [facility] CATH LAB;  Service: Cardiovascular;  Laterality: N/A;   ABDOMINAL AORTOGRAM W/LOWER EXTREMITY N/A 11/14/2019   Procedure: ABDOMINAL AORTOGRAM W/LOWER EXTREMITY;  Surgeon: Nada Libman, MD;  Location: [facility] INVASIVE CV LAB;  Service: Cardiovascular;  Laterality: N/A;   AMPUTATION Left 10/25/2015   Procedure: Left Foot 5th Ray Amputation;  Surgeon: Nadara Mustard, MD;  Location: [facility] OR;  Service: Orthopedics;  Laterality: Left;   AMPUTATION Left 12/06/2015   Procedure: AMPUTATION BELOW KNEE;  Surgeon: Nadara Mustard, MD;  Location: [facility] OR;  Service: Orthopedics;  Laterality: Left;   CARDIAC CATHETERIZATION     EF is 55-60% and no wall motion abnormalities (long long time ago)   DIRECT LARYNGOSCOPY N/A 12/10/2022   Procedure: DIRECT LARYNGOSCOPY WITH BIOPSY;  Surgeon: Serena Colonel, MD;  Location: [facility] OR;  Service: ENT;  Laterality: N/A;   FEMORAL-POPLITEAL BYPASS GRAFT Left 10/24/2015   Procedure: BYPASS GRAFT FEMORAL below knee POPLITEAL ARTERY with Left Saphenous Vein;  Surgeon: Nada Libman, MD;  Location: [facility] OR;  Service: Vascular;  Laterality: Left;   FEMORAL-POPLITEAL BYPASS GRAFT Right 11/15/2019   Procedure: BYPASS GRAFT FEMORAL-POPLITEAL ARTERY using Right Leg Greater Saphenous Vein;  Surgeon: Nada Libman, MD;  Location: [facility] OR;  Service: Vascular;  Laterality: Right;   FRACTURE SURGERY     left arm "many yrs ago"   LOWER EXTREMITY ANGIOGRAM N/A 01/23/2014   Procedure: LOWER EXTREMITY ANGIOGRAM;  Surgeon: Pamella Pert, MD;  Location: [facility] CATH LAB;  Service: Cardiovascular;  Laterality: N/A;   LOWER EXTREMITY ANGIOGRAM N/A 07/31/2014   Procedure: LOWER EXTREMITY ANGIOGRAM;  Surgeon: Pamella Pert, MD;  Location: [facility] CATH LAB;  Service: Cardiovascular;  Laterality: N/A;   LOWER EXTREMITY ANGIOGRAM Left 10/11/2015   Procedure: Lower Extremity Angiogram;  Surgeon: Nada Libman, MD;  Location: [facility] INVASIVE CV LAB;  Service: Cardiovascular;  Laterality: Left;   PERIPHERAL VASCULAR BALLOON ANGIOPLASTY  11/14/2019   Procedure: PERIPHERAL VASCULAR BALLOON ANGIOPLASTY;  Surgeon: Nada Libman, MD;  Location: [facility] INVASIVE CV LAB;  Service: Cardiovascular;;   PERIPHERAL VASCULAR CATHETERIZATION Left 10/11/2015   Procedure: Peripheral Vascular Balloon Angioplasty;  Surgeon: Nada Libman, MD;  Location: [facility] INVASIVE CV LAB;  Service: Cardiovascular;  Laterality: Left;  sfa failed unable to cross occluded sfa   PERIPHERAL VASCULAR CATHETERIZATION N/A 10/11/2015   Procedure: Abdominal Aortogram;  Surgeon: Nada Libman, MD;  Location: [facility] INVASIVE CV LAB;  Service: Cardiovascular;  Laterality: N/A;   STUMP REVISION Left 03/02/2016   Procedure: Revision Left Below Knee Amputation;  Surgeon: Nadara Mustard, MD;  Location: [facility] OR;  Service: Orthopedics;  Laterality: Left;   TRACHEOSTOMY TUBE PLACEMENT N/A 12/10/2022   Procedure: TRACHEOSTOMY UNDER LOCAL;  Surgeon: Serena Colonel, MD;  Location: [facility] OR;  Service: ENT;  Laterality: N/A;   HPI:  72 year old male presents to ED 4/17 after several days of gradually worsening SoB. 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 Developed stridor 4/18 and moved to ICU, tracheostomy and biopsy of laryngeal mass.  BJY:NWGN 1 diabetes mellitus, recent COPD diagnosis, coronary  artery disease, hyperlipidemia, hypertension and L BKA    Assessment / Plan / Recommendation  Clinical Impression  Pt with trach on trach collar (no PMV eval completed- MD ordered PMV after trach changed to cuffless which has not been done). No voicing present with finger occlusion. He has majority of dentition. There was intermittent immediate coughing noted with consecutive sips thin and cued for smaller sips did not make a significant difference. No s/s aspiration noted with solids. Given current laryngeal tumor and clinical observations recommend, and pt is agreeable to MBS to  assess oropharyngeal swallow which is scheduled for 1300 today. May continue regular texture, thin liquids, pill with water. SLP Visit Diagnosis: Dysphagia, unspecified (R13.10)    Aspiration Risk  Mild aspiration risk;Moderate aspiration risk    Diet Recommendation Regular;Thin liquid   Liquid Administration via: Straw;Cup Medication Administration: Whole meds with liquid Supervision: Patient able to self feed Compensations: Slow rate;Small sips/bites Postural Changes: Seated upright at 90 degrees    Other  Recommendations Oral Care Recommendations: Oral care BID    Recommendations for follow up therapy are one component of a multi-disciplinary discharge planning process, led by the attending physician.  Recommendations may be updated based on patient status, additional functional criteria and insurance authorization.  Follow up Recommendations  (TBD)      Assistance Recommended at Discharge    Functional Status Assessment Patient has had a recent decline in their functional status and demonstrates the ability to make significant improvements in function in a reasonable and predictable amount of time.  Frequency and Duration min 2x/week  2 weeks       Prognosis Prognosis for improved oropharyngeal function:  (fair-good)      Swallow Study   General Date of Onset: 12/09/22 HPI: 72 year old male  presents to ED 4/17 after several days of gradually worsening SoB. 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 Developed stridor 4/18 and moved to ICU, tracheostomy and biopsy of laryngeal mass.  FAO:ZHYQ 1 diabetes mellitus, recent COPD diagnosis, coronary artery disease, hyperlipidemia, hypertension and L BKA Type of Study: Bedside Swallow Evaluation Previous Swallow Assessment:  (none) Diet Prior to this Study: Regular;Thin liquids (Level 0) Temperature Spikes Noted: No Respiratory Status: Trach Collar;Trach Trach Size and Type: #6;Cuff;Deflated History of Recent Intubation: No Behavior/Cognition: Alert;Cooperative;Requires cueing Oral Cavity Assessment: Within Functional Limits Oral Care Completed by SLP: No Oral Cavity - Dentition: Adequate natural dentition (missing several posterior) Vision: Functional for self-feeding Self-Feeding Abilities: Able to feed self Patient Positioning: Upright in bed Baseline Vocal Quality:  (trach- no PMV- unable to get voice with finger occlusion) Volitional Swallow: Able to elicit    Oral/Motor/Sensory Function Overall Oral Motor/Sensory Function: Within functional limits   Ice Chips Ice chips: Not tested   Thin Liquid Thin Liquid: Impaired Presentation: Cup Pharyngeal  Phase Impairments: Throat Clearing - Immediate    Nectar Thick Nectar Thick Liquid: Not tested   Honey Thick Honey Thick Liquid: Not tested   Puree Puree: Not tested   Solid     Solid: Within functional limits      [name] 12/15/2022,10:17 AM

## 2022-12-15 NOTE — Progress Notes (Addendum)
Speech Language Pathology  Patient Details Name: [name] MRN: [id number] DOB: 12/02/1950 Today's Date: 12/15/2022 Time:  SLP Time Calculation (min) (ACUTE ONLY):    PMV assessment ordered when trach changed to cuffless which has not been performed.  Pt's cuff deflated at baseline and SLP finger occluded trach and he was unable to achieve phonation. Please notify SLP when pt's trach is changed to uncuffed so that we may proceed with PMV assessment per Dr Lucky Rathke orders.  Will proceed with MBS this afternoon at 1300- please see evaluation of bedside swallow.     [name]  12/15/2022, 10:19 AM

## 2022-12-16 DIAGNOSIS — I739 Peripheral vascular disease, unspecified: Secondary | ICD-10-CM | POA: Diagnosis not present

## 2022-12-16 DIAGNOSIS — E1151 Type 2 diabetes mellitus with diabetic peripheral angiopathy without gangrene: Secondary | ICD-10-CM | POA: Diagnosis not present

## 2022-12-16 DIAGNOSIS — I10 Essential (primary) hypertension: Secondary | ICD-10-CM | POA: Diagnosis not present

## 2022-12-16 DIAGNOSIS — C329 Malignant neoplasm of larynx, unspecified: Secondary | ICD-10-CM | POA: Diagnosis not present

## 2022-12-16 LAB — GLUCOSE, CAPILLARY
Glucose-Capillary: 223 mg/dL — ABNORMAL HIGH (ref 70–99)
Glucose-Capillary: 190 mg/dL — ABNORMAL HIGH (ref 70–99)
Glucose-Capillary: 198 mg/dL — ABNORMAL HIGH (ref 70–99)
Glucose-Capillary: 189 mg/dL — ABNORMAL HIGH (ref 70–99)
Glucose-Capillary: 227 mg/dL — ABNORMAL HIGH (ref 70–99)
Glucose-Capillary: 237 mg/dL — ABNORMAL HIGH (ref 70–99)

## 2022-12-16 LAB — CREATININE, SERUM
Creatinine, Ser: 0.75 mg/dL (ref 0.61–1.24)
GFR, Estimated: >60 mL/min (ref >60)

## 2022-12-16 NOTE — Procedures (Signed)
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 Stoma patent.  Procedure details:  The patient was placed in supine position. The tracheostomy sutures were removed and ties released. Appropriate tracheostomy replacement was prepared with obturator. The tracheostomy was removed and stoma suctioned. The new shiley 6-0 cuffless tube was inserted into the tracheostomy and obturator removed. Inner cannula placed. A tracheostomy collar was placed securing the tube.  Recommendations: - Routine tracheostomy tube care; futher tracheostomy tube changes by RT or Primary team  Electronically signed by:  Scarlette Ar, MD  Staff Physician Facial Plastic & Reconstructive Surgery Otolaryngology - Head and Neck Surgery [facility] [place]

## 2022-12-16 NOTE — Progress Notes (Signed)
PT Cancellation Note  Patient Details Name: [name] MRN: [id number] DOB: Jan 29, 1951   Cancelled Treatment:    Reason Eval/Treat Not Completed: (P) Other (comment) (pt standing at nursing desk with RW and dressed for DC. Pt requesting to DC. NT standing by for pt safety. See chart review for RN note, RN attempting to problem solve pt request, defer PT given pt requesting to leave at this time.)   Angus Palms 12/16/2022, 6:04 PM

## 2022-12-16 NOTE — TOC Initial Note (Signed)
Transition of Care (TOC) - Initial/Assessment Note  Donn Pierini RN, BSN Transitions of Care Unit 4E- RN Case Manager See Treatment Team for direct phone #   Patient Details  Name: [name] MRN: [id number] Date of Birth: 1951/03/20  Transition of Care [facility]) CM/SW Contact:    Darrold Span, RN Phone Number: 12/16/2022, 4:24 PM  Clinical Narrative:                 Pt s/p new Trach, from home w/ wife.  Per noted ENT is to change trach to cuffless #6 prior to going home.   RT has initiated [employer] education with pt/wife.   Pt will need Trach supplies set up for home- Order forms on front of chart for signature ENT MD needs to sign Trach supply Order form  CM has spoken with pt and explained that staff is working to get in touch with ENT doctor for plan with regarding to changing out Janina Mayo- pt is wanting to go home today- explained that pt needs supplies for home- and CM would like to set this up for pt to go home safely- pt nodded that he understood, and wrote on wipe board that he has been waiting since Monday. Re-assured pt that staff is working to get in touch with Dr. Pollyann Kennedy to find out plan to get Trach changed, and to get Order for Janina Mayo supplies that are needed for home. Discussed using in house provider for needed supplies- pt nodded agreement.   TOC has also spoken to [facility] supervisor Jiles Crocker to see if she can assist with barriers.   CM to continue to follow for transition needs  Expected Discharge Plan: Home/Self Care Barriers to Discharge: Continued Medical Work up   Patient Goals and CMS Choice Patient states their goals for this hospitalization and ongoing recovery are:: return home CMS Medicare.gov Compare Post Acute Care list provided to:: Patient Choice offered to / list presented to : Patient      Expected Discharge Plan and Services   Discharge Planning Services: CM Consult Post Acute Care Choice: Durable Medical Equipment Living  arrangements for the past 2 months: Single Family Home                 DME Arranged: Trach supplies DME Agency: [employer] Date DME Agency Contacted: 12/16/22 Time DME Agency Contacted: [phone] Representative spoke with at DME Agency: Bonita Quin            Prior Living Arrangements/Services Living arrangements for the past 2 months: Single Family Home Lives with:: Spouse Patient language and need for interpreter reviewed:: Yes        Need for Family Participation in Patient Care: Yes (Comment) Care giver support system in place?: Yes (comment)   Criminal Activity/Legal Involvement Pertinent to Current Situation/Hospitalization: No - Comment as needed  Activities of Daily Living Home Assistive Devices/Equipment: None ADL Screening (condition at time of admission) Patient's cognitive ability adequate to safely complete daily activities?: Yes Is the patient deaf or have difficulty hearing?: No Does the patient have difficulty seeing, even when wearing glasses/contacts?: No Does the patient have difficulty concentrating, remembering, or making decisions?: No Patient able to express need for assistance with ADLs?: Yes Does the patient have difficulty dressing or bathing?: No Independently performs ADLs?: Yes (appropriate for developmental age) Does the patient have difficulty walking or climbing stairs?: No Weakness of Legs: None Weakness of Arms/Hands: None  Permission Sought/Granted Permission sought to share information with : Magazine features editor  Permission granted to share information with : Yes, Verbal Permission Granted     Permission granted to share info w AGENCY: DME        Emotional Assessment Appearance:: Appears stated age Attitude/Demeanor/Rapport: Other (comment), Engaged (Frustrated) Affect (typically observed): Appropriate, Frustrated Orientation: : Oriented to Self, Oriented to Place, Oriented to  Time, Oriented to Situation Alcohol / Substance Use:  Not Applicable Psych Involvement: No (comment)  Admission diagnosis:  Shortness of breath [R06.02] COPD with acute exacerbation [J44.1] 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] Patient Active Problem List   Diagnosis Date Noted   Protein-calorie malnutrition, severe 12/15/2022   Postprocedural pneumothorax 12/11/2022   Acute respiratory failure with hypoxia 12/11/2022   Tracheostomy status 12/11/2022   Pressure injury of skin 12/11/2022   Hyponatremia 12/10/2022   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 12/09/2022   Adrenal adenoma, left 12/09/2022   Essential hypertension 12/09/2022   Dyspnea 11/27/2022   Elevated troponin 11/27/2022   Cough 10/21/2017   Wheezing 09/21/2017   Below-knee amputation of left lower extremity 12/06/2015   Diabetic retinopathy associated with type 1 diabetes mellitus 12/27/2014   Pleurodynia 11/23/2014   Diabetes mellitus with peripheral artery disease and hyperglycemia 07/30/2014   Claudication in peripheral vascular disease 07/30/2014   Male erectile disorder 08/10/2013   PVD (peripheral vascular disease) s/p R fem-pop bypass and L BKA 04/15/2011   Hyperlipidemia 04/15/2011   Polyneuropathy 10/11/2009   Carotid artery occlusion 06/27/2009   Tobacco user 06/18/2009   PCP:  [name], MD Pharmacy:   [facility] [phone] - [place], [place] - [address] [place] [address] Phone: [phone] Fax: [phone]  [facility] [address] [place], [place] - [address] [place] [address] Phone: [phone] Fax: [phone]  [facility] [address] [place], [place] - [address] [place] [address] Phone: [phone] Fax: [phone]  [facility]) - [place], [place] - [address] [facility] [address] [place] [address] [place] [address] Phone: [phone] Fax: [phone]  [facility] - [place], [place] - [address] [place] [address] Phone: [phone] Fax: [phone]     Social Determinants of Health (SDOH) Social History: SDOH Screenings   Food Insecurity: No Food Insecurity (12/09/2022)  Housing: Low Risk  (12/09/2022)  Transportation Needs: No Transportation Needs (12/09/2022)  Utilities: Not At Risk (12/09/2022)  Tobacco Use: High Risk (12/11/2022)   SDOH Interventions:     Readmission Risk Interventions     No data to display

## 2022-12-16 NOTE — Progress Notes (Signed)
Speech Language Pathology  Patient Details Name: [name] MRN: [id number] DOB: [birth year] Today's Date: 12/16/2022 Time: 4098-1191 SLP Time Calculation (min) (ACUTE ONLY): 16 min    SLP attempted to contact Dr. Pollyann Kennedy. SLP spoke with Rinaldo Cloud at [facility] who stated Dr. Pollyann Kennedy is in the OR today but would send him a message asking if plans are to change his trach to uncuffed.       [name]  12/16/2022, 2:56 PM

## 2022-12-16 NOTE — Progress Notes (Addendum)
Contacted ENT MD regarding change of trach for patient. This RN was able to hear back from Dr. Pollyann Kennedy ENT MD, today but he is unable to come and change out trach. Trach #6 uncufffed is at bedside as requested.   Respiratory therapy also unable to perform the change of trach at this time. I have called ENT office to see if they would be able to give any guidance on this matter at this time. Awaiting a call back from on call provider/office.  Patient wanting to leave today.  Charge RN, RN Sports coach aware also. Mariaceleste Herrera, Randall An RN

## 2022-12-16 NOTE — Progress Notes (Signed)
Speech Language Pathology Treatment: Dysphagia  Patient Details Name: [name] MRN: [id number] DOB: 1951/02/20 Today's Date: 12/16/2022 Time: 4098-1191 SLP Time Calculation (min) (ACUTE ONLY): 16 min  Assessment / Plan / Recommendation Clinical Impression  Pt seen for dysphagia treatment. Pt recalls strategy of covering trach with gloved finger and strong cough to be performed during meals in attempts to eject potential penetrates. He needed visual/tactile prompts to move trach collar to the side to reach end of trach. Observed with water via straw and graham cracker trials without s/s aspiration and pt independently able to move trach collar, cover trach with gloved finger with strong cough. Recommend continue regular/thin liquids with strategy. Pt requested and SLP obtained a suction toothbrush.   Pt asked SLP if trach was going to be changed. SLP reached out to Dr. Pollyann Kennedy via secure chat to ask if trach would be changed to uncuffed before pt is discharged from the hospital.    HPI HPI: 72 year old male presents to ED 4/17 after several days of gradually worsening SoB. 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 Developed stridor 4/18 and moved to ICU, tracheostomy and biopsy of laryngeal mass.  YNW:GNFA 1 diabetes mellitus, recent COPD diagnosis, coronary artery disease, hyperlipidemia, hypertension and L BKA      SLP Plan  Continue with current plan of care      Recommendations for follow up therapy are one component of a multi-disciplinary discharge planning process, led by the attending physician.  Recommendations may be updated based on patient status, additional functional criteria and insurance authorization.    Recommendations  Diet recommendations: Regular;Thin liquid Liquids provided via: Straw Medication Administration: Whole meds with liquid Supervision: Patient able to self feed Compensations: Slow rate;Small sips/bites;Other (Comment) (finger occlude trach using glove and  strong cough) Postural Changes and/or Swallow Maneuvers: Seated upright 90 degrees                  Oral care BID   Intermittent Supervision/Assistance Dysphagia, oropharyngeal phase (R13.12)     Continue with current plan of care     [name]  12/16/2022, 11:28 AM

## 2022-12-16 NOTE — Progress Notes (Addendum)
PROGRESS NOTE    [name]  ZOX:[id number] DOB: 11-11-1950 DOA: 12/09/2022 PCP: [name], MD    Chief Complaint  Patient presents with   Shortness of Breath    Brief Narrative:  [name] is an 72 y.o. 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 Active Problems:   PVD (peripheral vascular disease) s/p R fem-pop bypass and L BKA   Hyperlipidemia   Diabetes mellitus with peripheral artery disease and hyperglycemia   Below-knee amputation of left lower extremity   Tobacco user   Adrenal adenoma, left   Essential hypertension   Hyponatremia   Postprocedural pneumothorax   Acute respiratory failure with hypoxia   Tracheostomy status   Pressure injury of skin   Protein-calorie malnutrition, severe  #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 -Tracheostomy in place, ENT was following. 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 -ENT attempted to exchange cuffless tube on 12/14/2022 however it was noted that nursing staff was unable to obtain 1. -Awaiting ENT follow-up to see for exchange of cuffless tube and further recommendations. -Spoke with oncology who had spoken with prior hospitalist, reviewed films and felt patient just needed outpatient follow-up with radiation oncology and ENT. -Oncology navigator notified of patient. -Per ENT.  2.  Right apical pneumothorax/subcutaneous emphysema -Resolved on chest x-ray. -PCCM signed off  and recommending follow-up in 4 to 6 weeks.  3.  History of recently diagnosed COPD/possible CAP versus aspiration -Improving clinically. -Some improvement with copious secretions. -Seen by PCCM recommended continuation of Brovana, budesonide, scheduled nebs. -IV antibiotics have been narrowed down to Augmentin which will continue to complete a 7-10 day course of antibiotic treatment. -Outpatient follow-up with pulmonary.  4.  Type 1 diabetes mellitus -Hemoglobin A1c 8.0 (12/09/2022) -CBG 190 this morning. -Continue insulin pump.  5.  Peripheral vascular disease status post left BKA/CAD/hypertension -Stable. -Continue aspirin, metoprolol, Crestor, Altace.  6.  Left adrenal adenoma -Will need outpatient PET scan for follow-up.  7.  Stage II pretibial ulcer, POA Pressure Injury 12/11/22 Pretibial Left;Proximal Stage 2 -  Partial thickness loss of dermis presenting as a shallow open injury with a red, pink wound bed without slough. (Active)  12/11/22 1053  Location: Pretibial  Location Orientation: Left;Proximal  Staging: Stage 2 -  Partial thickness loss of dermis presenting as a shallow open injury with a red, pink wound bed without slough.  Wound Description (Comments):   Present on Admission: Yes (per pt he has had it for 3 weeks)       DVT prophylaxis: Lovenox Code Status: Full Family Communication: Updated patient.  No family at bedside. Disposition: Home when cleared by ENT.  Status is: Inpatient Remains inpatient appropriate because: Severity of illness   Consultants:  ENT: Dr. Jearld Fenton 12/09/2022  Procedures:  CT soft tissue neck 12/10/2022 Modified barium swallow 12/15/2022 Tracheostomy under local anesthesia/direct laryngoscopy with biopsy of laryngeal mass under general anesthesia per Dr. Pollyann Kennedy, ENT 12/10/2022  Significant Hospital Events: Including procedures, antibiotic start and stop dates in addition to other pertinent events   04/17 - Patient  admitted to [facility]  for new laryngeal mass 04/18 - Patient had a rapid called for worsening shortness of breath. Patient required to go to ICU closer monitoring.  4/18 - Emergent tracheostomy 4/20 - Added Unasyn 4/22 - Significant secretion burden. SLP consult in place, added PMV eval once appropriate. ?ENT trach change to cuffless today, per note 4/19.  Antimicrobials:  Anti-infectives (From admission, onward)    Start     Dose/Rate Route Frequency Ordered Stop   12/15/22 1130  amoxicillin-clavulanate (AUGMENTIN) 600-42.9 MG/5ML suspension 600 mg        600 mg Oral 2 times daily 12/15/22 1041     12/15/22 1000  amoxicillin-clavulanate (AUGMENTIN) 875-125 MG per tablet 1 tablet  Status:  Discontinued        1 tablet Oral Every 12 hours 12/15/22 0906 12/15/22 1041   12/12/22 1600  Ampicillin-Sulbactam (UNASYN) 3 g in sodium chloride 0.9 % 100 mL IVPB  Status:  Discontinued        3 g 200 mL/hr over 30 Minutes Intravenous Every 6 hours 12/12/22 1500 12/15/22 0906         Subjective: Patient sitting up at the side of the bed.  Frustrated tracheostomy has not been changed and awaiting this to be done per ENT.  Patient anxious to be discharged home.  Denies any chest pain or shortness of breath.  Overall feels well than when he was admitted.  Objective: Vitals:   12/16/22 1111 12/16/22 1452 12/16/22 1500 12/16/22 1600  BP: (!) 115/33  (!) 121/59   Pulse: 61 (!) 54 61 63  Resp: (!) 22 20 (!) 27 18  Temp: 98.4 F (36.9 C)  98.8 F (37.1 C)   TempSrc: Oral  Oral   SpO2: 94% 96% 95% 95%  Weight:      Height:        Intake/Output Summary (Last 24 hours) at 12/16/2022 1841 Last data filed at 12/16/2022 0900 Gross per 24 hour  Intake --  Output 500 ml  Net -500 ml   Filed Weights   12/09/22 0518 12/09/22 1508  Weight: 68 kg 58.6 kg    Examination:  General exam: Appears calm and comfortable.  Tracheostomy intact. Respiratory system: Clear to auscultation.  No wheezes, no crackles, no rhonchi.   Fair air movement.  Speaking in full sentences.  Respiratory effort normal. Cardiovascular system: S1 & S2 heard, RRR. No JVD, murmurs, rubs, gallops or clicks. No pedal edema. Gastrointestinal system: Abdomen is nondistended, soft and nontender. No organomegaly or masses felt. Normal bowel sounds heard. Central nervous system: Alert and oriented. No focal neurological deficits. Extremities: Status post left BKA, prosthesis on.   Skin: No rashes, lesions or ulcers Psychiatry: Judgement and insight appear normal. Mood & affect appropriate.     Data Reviewed: I have personally reviewed following labs and imaging studies  CBC: Recent Labs  Lab 12/10/22 0246 12/11/22 0055 12/12/22 0134 12/13/22 1243 12/14/22 0133  WBC 9.4 18.4* 14.7* 11.2* 7.9  NEUTROABS  --  17.2*  --  9.0* 5.8  HGB 12.0* 12.7* 13.2 12.0* 11.4*  HCT 33.1* 36.6* 37.2* 35.0* 32.6*  MCV 91.4 93.8 93.0 94.9 93.1  PLT 343 349 335 295 275    Basic Metabolic Panel: Recent Labs  Lab 12/10/22 0246 12/11/22 0055 12/12/22 0134 12/13/22 1243 12/14/22 0133 12/16/22 0534  NA 132* 136 133* 136 135  --   K 4.5 4.8 4.4 3.9 3.7  --   CL 99 101 97* 99 97*  --  CO2 26 25 27 26 27   --   GLUCOSE 301* 150* 165* 177* 238*  --   BUN 24* 24* 26* 27* 24*  --   CREATININE 0.89 0.81 0.99 1.00 0.80 0.75  CALCIUM 8.9 8.7* 8.6* 8.4* 8.2*  --   PHOS  --   --   --  2.6 2.6  --     GFR: Estimated Creatinine Clearance: 71.2 mL/min (by C-G formula based on SCr of 0.75 mg/dL).  Liver Function Tests: Recent Labs  Lab 12/10/22 0246 12/13/22 1243 12/14/22 0133  AST 22  --   --   ALT 24  --   --   ALKPHOS 70  --   --   BILITOT 0.6  --   --   PROT 5.8*  --   --   ALBUMIN 3.1* 2.5* 2.2*    CBG: Recent Labs  Lab 12/16/22 0305 12/16/22 0630 12/16/22 1112 12/16/22 1405 12/16/22 1607  GLUCAP 223* 190* 198* 189* 227*     Recent Results (from the past 240 hour(s))  Respiratory (~20 pathogens) panel by PCR     Status: None    Collection Time: 12/09/22  3:29 PM   Specimen: Nasopharyngeal Swab; Respiratory  Result Value Ref Range Status   Adenovirus NOT DETECTED NOT DETECTED Final   Coronavirus 229E NOT DETECTED NOT DETECTED Final    Comment: (NOTE) The Coronavirus on the Respiratory Panel, DOES NOT test for the novel  Coronavirus (2019 nCoV)    Coronavirus HKU1 NOT DETECTED NOT DETECTED Final   Coronavirus NL63 NOT DETECTED NOT DETECTED Final   Coronavirus OC43 NOT DETECTED NOT DETECTED Final   Metapneumovirus NOT DETECTED NOT DETECTED Final   Rhinovirus / Enterovirus NOT DETECTED NOT DETECTED Final   Influenza A NOT DETECTED NOT DETECTED Final   Influenza B NOT DETECTED NOT DETECTED Final   Parainfluenza Virus 1 NOT DETECTED NOT DETECTED Final   Parainfluenza Virus 2 NOT DETECTED NOT DETECTED Final   Parainfluenza Virus 3 NOT DETECTED NOT DETECTED Final   Parainfluenza Virus 4 NOT DETECTED NOT DETECTED Final   Respiratory Syncytial Virus NOT DETECTED NOT DETECTED Final   Bordetella pertussis NOT DETECTED NOT DETECTED Final   Bordetella Parapertussis NOT DETECTED NOT DETECTED Final   Chlamydophila pneumoniae NOT DETECTED NOT DETECTED Final   Mycoplasma pneumoniae NOT DETECTED NOT DETECTED Final    Comment: Performed at [facility] Lab, [address] [place], [place] [address]  SARS Coronavirus 2 by RT PCR (hospital order, performed in [facility] hospital lab) *cepheid single result test* Anterior Nasal Swab     Status: None   Collection Time: 12/10/22  1:56 PM   Specimen: Anterior Nasal Swab  Result Value Ref Range Status   SARS Coronavirus 2 by RT PCR NEGATIVE NEGATIVE Final    Comment: Performed at [facility] Lab, [address], [place], [place] [address]  MRSA Next Gen by PCR, Nasal     Status: None   Collection Time: 12/10/22  6:18 PM   Specimen: Nasal Mucosa; Nasal Swab  Result Value Ref Range Status   MRSA by PCR Next Gen NOT DETECTED NOT DETECTED Final    Comment: (NOTE) The  GeneXpert MRSA Assay (FDA approved for NASAL specimens only), is one component of a comprehensive MRSA colonization surveillance program. It is not intended to diagnose MRSA infection nor to guide or monitor treatment for MRSA infections. Test performance is not FDA approved in patients less than 105 years old. Performed at  [facility] Lab, [address] [place], [place] [address]   Culture, Respiratory w Gram Stain     Status: None   Collection Time: 12/12/22  2:45 PM   Specimen: Tracheal Aspirate; Respiratory  Result Value Ref Range Status   Specimen Description TRACHEAL ASPIRATE  Final   Special Requests NONE  Final   Gram Stain   Final    RARE WBC PRESENT, PREDOMINANTLY PMN MODERATE GRAM NEGATIVE RODS FEW GRAM POSITIVE COCCI    Culture   Final    MODERATE Normal respiratory flora-no Staph aureus or Pseudomonas seen Performed at [facility], [address], [place], [place] [address]    Report Status 12/14/2022 FINAL  Final         Radiology Studies: DG Swallowing Func-Speech Pathology  Result Date: 12/15/2022 Table formatting from the original result was not included. Modified Barium Swallow Study Patient Details Name: JAHLEEL STROSCHEIN MRN: [id number] Date of Birth: [birth year] Today's Date: 12/15/2022 HPI/PMH: HPI: 72 year old male presents to ED 4/17 after several days of gradually worsening SoB. 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 Developed stridor 4/18 and moved to ICU, tracheostomy and biopsy of laryngeal mass.  WGN:FAOZ 1 diabetes mellitus, recent COPD diagnosis, coronary artery disease, hyperlipidemia, hypertension and L BKA Clinical Impression: Clinical Impression: Pt's MBS completed without a PMV due to trach not yet changed to uncuffed prior to [facility] evaluation per ENT orders. He demonstrated mild oropharyngeal dysphagia marked by decreased oral transit with pt compensating by  thrusting/extending head to assist in oral transit and occasional minimal lingual residue.  Pt noted to have reduced laryngeal elevation and epiglottic deflection resulting in penetration with thin barium before full closure could occur. Most penetration instances with thin were initially ejected from vestibule during the swallow however barium on upper 1/3 of epiglottis began to penetrate midway into vestiuble without awareness after the swallow. Pt then penetrated to vocal cords with thin. Gloved finger occlusion with cough helped mobilize penetrate to upper epiglottis. Chin tuck resulted in silent aspiration and right head turn was ineffective. Nectar thick penetrated to the cords and finger occlusion with cough moved barium higher in vestibule. Mastication and transit of solid was functional. Recommend pt continue regular texture, thin liquid with gloved finger occlusion and strong cough after every 2-3 sips liquid, pills whole in puree. Pt is being worked up for laryngeal mass and treatment plan is unknown to this SLP. Recommend follow up with home health ST for continuation of strategies and assist during treatment process. Factors that may increase risk of adverse event in presence of aspiration Rubye Oaks & Clearance Coots 2021): Factors that may increase risk of adverse event in presence of aspiration Rubye Oaks & Clearance Coots 2021): Presence of tubes (ETT, trach, NG, etc.) Recommendations/Plan: Swallowing Evaluation Recommendations Swallowing Evaluation Recommendations Recommendations: PO diet PO Diet Recommendation: Regular; Thin liquids (Level 0) Liquid Administration via: Cup Medication Administration: Whole meds with puree Supervision: Patient able to self-feed Swallowing strategies  : Slow rate; Small bites/sips; Multiple dry swallows after each bite/sip (finger occlude trach and cough) Postural changes: Position pt fully upright for meals Oral care recommendations: Oral care BID (2x/day) Treatment Plan Treatment Plan Treatment recommendations: Therapy as outlined in treatment plan below Follow-up  recommendations: Home health SLP Functional status assessment: Patient has had a recent decline in their functional status and demonstrates the ability to make significant improvements in function in a reasonable and predictable amount of time. Treatment frequency: Min 2x/week Treatment duration: 2 weeks Interventions: Compensatory techniques;  Patient/family education; Diet toleration management by SLP Recommendations Recommendations for follow up therapy are one component of a multi-disciplinary discharge planning process, led by the attending physician.  Recommendations may be updated based on patient status, additional functional criteria and insurance authorization. Assessment: Orofacial Exam: Orofacial Exam Oral Cavity: Oral Hygiene: WFL Oral Cavity - Dentition: Adequate natural dentition Orofacial Anatomy: WFL Oral Motor/Sensory Function: WFL Anatomy: Anatomy: Other (Comment) (possible fullness around vocal cords -difficult to view) Boluses Administered: Boluses Administered Boluses Administered: Thin liquids (Level 0); Mildly thick liquids (Level 2, nectar thick); Puree; Solid  Oral Impairment Domain: Oral Impairment Domain Lip Closure: No labial escape Tongue control during bolus hold: Cohesive bolus between tongue to palatal seal Bolus preparation/mastication: Timely and efficient chewing and mashing Bolus transport/lingual motion: Brisk tongue motion (extended head back to help transit bolus) Oral residue: Trace residue lining oral structures Location of oral residue : Tongue Initiation of pharyngeal swallow : Pyriform sinuses; Valleculae  Pharyngeal Impairment Domain: Pharyngeal Impairment Domain Soft palate elevation: No bolus between soft palate (SP)/pharyngeal wall (PW) Laryngeal elevation: Partial superior movement of thyroid cartilage/partial approximation of arytenoids to epiglottic petiole Anterior hyoid excursion: Complete anterior movement Epiglottic movement: Partial inversion Laryngeal  vestibule closure: Incomplete, narrow column air/contrast in laryngeal vestibule Pharyngeal stripping wave : Present - diminished Pharyngeal contraction (A/P view only): N/A Pharyngoesophageal segment opening: Complete distension and complete duration, no obstruction of flow Tongue base retraction: Narrow column of contrast or air between tongue base and PPW Pharyngeal residue: Collection of residue within or on pharyngeal structures Location of pharyngeal residue: Valleculae; Pyriform sinuses; Aryepiglottic folds; Pharyngeal wall  Esophageal Impairment Domain: Esophageal Impairment Domain Esophageal clearance upright position: Complete clearance, esophageal coating Pill: Esophageal Impairment Domain Esophageal clearance upright position: Complete clearance, esophageal coating Penetration/Aspiration Scale Score: Penetration/Aspiration Scale Score 2.  Material enters airway, remains ABOVE vocal cords then ejected out: Thin liquids (Level 0); Solid; Puree; Mildly thick liquids (Level 2, nectar thick) 3.  Material enters airway, remains ABOVE vocal cords and not ejected out: Thin liquids (Level 0); Mildly thick liquids (Level 2, nectar thick) 8.  Material enters airway, passes BELOW cords without attempt by patient to eject out (silent aspiration) : Thin liquids (Level 0) (with a chin tuck) Compensatory Strategies: Compensatory Strategies Compensatory strategies: Yes Chin tuck: Ineffective Ineffective Chin Tuck: Thin liquid (Level 0) Right head turn: Ineffective Ineffective Right Head Turn: Thin liquid (Level 0)   General Information: Caregiver present: No  Diet Prior to this Study: Regular; Thin liquids (Level 0)   Temperature : Normal   Respiratory Status: WFL   Supplemental O2: Trach Collar   History of Recent Intubation: No  Behavior/Cognition: Alert; Cooperative Self-Feeding Abilities: Able to self-feed Baseline vocal quality/speech: -- (no pmv- no voicing with finger occlusion) Volitional Cough: -- (some air  movement with finger occlusion) Volitional Swallow: Able to elicit No data recorded Goal Planning: Prognosis for improved oropharyngeal function: -- (fair-good) No data recorded No data recorded No data recorded Consulted and agree with results and recommendations: Patient Pain: Pain Assessment Pain Assessment: No/denies pain Faces Pain Scale: 4 Pain Location: Throat Pain Descriptors / Indicators: Grimacing; Guarding; Discomfort Pain Intervention(s): Limited activity within patient's tolerance; Monitored during session End of Session: Start Time:SLP Start Time (ACUTE ONLY): 1314 Stop Time: SLP Stop Time (ACUTE ONLY): 1331 Time Calculation:SLP Time Calculation (min) (ACUTE ONLY): 17 min Charges: SLP Evaluations $ SLP Speech Visit: 1 Visit SLP Evaluations $BSS Swallow: 1 Procedure $MBS Swallow: 1 Procedure SLP visit diagnosis: SLP Visit  Diagnosis: Dysphagia, oropharyngeal phase (R13.12) Past Medical History: Past Medical History: Diagnosis Date  Cigarette smoker   Coronary artery disease   Diabetes mellitus   type 1  x 50 yrs  Diabetic retinopathy   Dyslipidemia   Hypertension   Peripheral vascular disease  Past Surgical History: Past Surgical History: Procedure Laterality Date  ABDOMINAL ANGIOGRAM N/A 05/29/2014  Procedure: ABDOMINAL ANGIOGRAM;  Surgeon: Pamella Pert, MD;  Location: [facility] CATH LAB;  Service: Cardiovascular;  Laterality: N/A;  ABDOMINAL AORTOGRAM W/LOWER EXTREMITY N/A 11/14/2019  Procedure: ABDOMINAL AORTOGRAM W/LOWER EXTREMITY;  Surgeon: Nada Libman, MD;  Location: [facility] INVASIVE CV LAB;  Service: Cardiovascular;  Laterality: N/A;  AMPUTATION Left 10/25/2015  Procedure: Left Foot 5th Ray Amputation;  Surgeon: Nadara Mustard, MD;  Location: [facility] OR;  Service: Orthopedics;  Laterality: Left;  AMPUTATION Left 12/06/2015  Procedure: AMPUTATION BELOW KNEE;  Surgeon: Nadara Mustard, MD;  Location: [facility] OR;  Service: Orthopedics;  Laterality: Left;  CARDIAC CATHETERIZATION    EF is 55-60% and no wall motion  abnormalities (long long time ago)  DIRECT LARYNGOSCOPY N/A 12/10/2022  Procedure: DIRECT LARYNGOSCOPY WITH BIOPSY;  Surgeon: Serena Colonel, MD;  Location: [facility] OR;  Service: ENT;  Laterality: N/A;  FEMORAL-POPLITEAL BYPASS GRAFT Left 10/24/2015  Procedure: BYPASS GRAFT FEMORAL below knee POPLITEAL ARTERY with Left Saphenous Vein;  Surgeon: Nada Libman, MD;  Location: [facility] OR;  Service: Vascular;  Laterality: Left;  FEMORAL-POPLITEAL BYPASS GRAFT Right 11/15/2019  Procedure: BYPASS GRAFT FEMORAL-POPLITEAL ARTERY using Right Leg Greater Saphenous Vein;  Surgeon: Nada Libman, MD;  Location: [facility] OR;  Service: Vascular;  Laterality: Right;  FRACTURE SURGERY    left arm "many yrs ago"  LOWER EXTREMITY ANGIOGRAM N/A 01/23/2014  Procedure: LOWER EXTREMITY ANGIOGRAM;  Surgeon: Pamella Pert, MD;  Location: [facility] CATH LAB;  Service: Cardiovascular;  Laterality: N/A;  LOWER EXTREMITY ANGIOGRAM N/A 07/31/2014  Procedure: LOWER EXTREMITY ANGIOGRAM;  Surgeon: Pamella Pert, MD;  Location: [facility] CATH LAB;  Service: Cardiovascular;  Laterality: N/A;  LOWER EXTREMITY ANGIOGRAM Left 10/11/2015  Procedure: Lower Extremity Angiogram;  Surgeon: Nada Libman, MD;  Location: [facility] INVASIVE CV LAB;  Service: Cardiovascular;  Laterality: Left;  PERIPHERAL VASCULAR BALLOON ANGIOPLASTY  11/14/2019  Procedure: PERIPHERAL VASCULAR BALLOON ANGIOPLASTY;  Surgeon: Nada Libman, MD;  Location: [facility] INVASIVE CV LAB;  Service: Cardiovascular;;  PERIPHERAL VASCULAR CATHETERIZATION Left 10/11/2015  Procedure: Peripheral Vascular Balloon Angioplasty;  Surgeon: Nada Libman, MD;  Location: [facility] INVASIVE CV LAB;  Service: Cardiovascular;  Laterality: Left;  sfa failed unable to cross occluded sfa  PERIPHERAL VASCULAR CATHETERIZATION N/A 10/11/2015  Procedure: Abdominal Aortogram;  Surgeon: Nada Libman, MD;  Location: [facility] INVASIVE CV LAB;  Service: Cardiovascular;  Laterality: N/A;  STUMP REVISION Left 03/02/2016  Procedure: Revision Left Below Knee  Amputation;  Surgeon: Nadara Mustard, MD;  Location: [facility] OR;  Service: Orthopedics;  Laterality: Left;  TRACHEOSTOMY TUBE PLACEMENT N/A 12/10/2022  Procedure: TRACHEOSTOMY UNDER LOCAL;  Surgeon: Serena Colonel, MD;  Location: [facility] OR;  Service: ENT;  Laterality: N/A; [name] 12/15/2022, 2:46 PM  DG Chest Port 1 View  Result Date: 12/15/2022 CLINICAL DATA:  Encounter for pneumothorax. EXAM: PORTABLE CHEST 1 VIEW COMPARISON:  12/13/2022 FINDINGS: Persistent lucency at the right lung apex is compatible with a small pneumothorax which is minimally changed. Again noted is subcutaneous air on the left side of the chest. There is decreased subcutaneous air in the lower neck regions. Patient has a tracheostomy tube.  Heart size is stable. Patchy interstitial densities in the lower lungs, right side greater than left. IMPRESSION: 1. Stable small right apical pneumothorax. 2. Slightly decreased subcutaneous air. 3. Patchy interstitial densities in both lungs. Findings are nonspecific but could represent atelectasis or interstitial edema. Atypical infection cannot be excluded. Recommend continued follow-up. Electronically Signed   By: Richarda Overlie M.D.   On: 12/15/2022 09:43        Scheduled Meds:  amoxicillin-clavulanate  600 mg Oral BID   arformoterol  15 mcg Nebulization BID   aspirin EC  325 mg Oral QHS   budesonide (PULMICORT) nebulizer solution  0.5 mg Nebulization BID   Chlorhexidine Gluconate Cloth  6 each Topical Daily   enoxaparin (LOVENOX) injection  40 mg Subcutaneous Q24H   folic acid  1,500 mcg Oral QHS   insulin pump   Subcutaneous TID WC, HS, 0200   ipratropium-albuterol  3 mL Nebulization BID   metoprolol succinate  25 mg Oral QHS   ramipril  5 mg Oral QHS   rosuvastatin  10 mg Oral QHS   Continuous Infusions:   LOS: 6 days    Time spent: 35 minutes    Ramiro Harvest, MD [facility]   To contact the attending provider between 7A-7P or the covering provider during  after hours 7P-7A, please log into the web site [email or web address] and access using universal [place] password for that web site. If you do not have the password, please call the hospital operator.  12/16/2022, 6:41 PM

## 2022-12-17 ENCOUNTER — Other Ambulatory Visit (HOSPITAL_COMMUNITY): Payer: Self-pay

## 2022-12-17 DIAGNOSIS — J9601 Acute respiratory failure with hypoxia: Secondary | ICD-10-CM | POA: Diagnosis not present

## 2022-12-17 DIAGNOSIS — C329 Malignant neoplasm of larynx, unspecified: Secondary | ICD-10-CM | POA: Diagnosis not present

## 2022-12-17 DIAGNOSIS — D3502 Benign neoplasm of left adrenal gland: Secondary | ICD-10-CM | POA: Diagnosis not present

## 2022-12-17 DIAGNOSIS — I10 Essential (primary) hypertension: Secondary | ICD-10-CM | POA: Diagnosis not present

## 2022-12-17 LAB — GLUCOSE, CAPILLARY
Glucose-Capillary: 219 mg/dL — ABNORMAL HIGH (ref 70–99)
Glucose-Capillary: 171 mg/dL — ABNORMAL HIGH (ref 70–99)
Glucose-Capillary: 177 mg/dL — ABNORMAL HIGH (ref 70–99)
Glucose-Capillary: 294 mg/dL — ABNORMAL HIGH (ref 70–99)

## 2022-12-17 LAB — BASIC METABOLIC PANEL
Anion gap: 7 (ref 5–15)
BUN: 20 mg/dL (ref 8–23)
CO2: 27 mmol/L (ref 22–32)
Calcium: 8.1 mg/dL — ABNORMAL LOW (ref 8.9–10.3)
Chloride: 100 mmol/L (ref 98–111)
Creatinine, Ser: 0.72 mg/dL (ref 0.61–1.24)
GFR, Estimated: >60 mL/min (ref >60)
Glucose, Bld: 282 mg/dL — ABNORMAL HIGH (ref 70–99)
Potassium: 4.2 mmol/L (ref 3.5–5.1)
Sodium: 134 mmol/L — ABNORMAL LOW (ref 135–145)

## 2022-12-17 LAB — CBC WITH DIFFERENTIAL/PLATELET
Abs Immature Granulocytes: 0.04 10*3/uL (ref 0.00–0.07)
Basophils Absolute: 0 10*3/uL (ref 0.0–0.1)
Basophils Relative: 0 %
Eosinophils Absolute: 0.1 10*3/uL (ref 0.0–0.5)
Eosinophils Relative: 2 %
HCT: 26.6 % — ABNORMAL LOW (ref 39.0–52.0)
Hemoglobin: 9.6 g/dL — ABNORMAL LOW (ref 13.0–17.0)
Immature Granulocytes: 1 %
Lymphocytes Relative: 18 %
Lymphs Abs: 1.3 10*3/uL (ref 0.7–4.0)
MCH: 33.2 pg (ref 26.0–34.0)
MCHC: 36.1 g/dL — ABNORMAL HIGH (ref 30.0–36.0)
MCV: 92 fL (ref 80.0–100.0)
Monocytes Absolute: 0.9 10*3/uL (ref 0.1–1.0)
Monocytes Relative: 12 %
Neutro Abs: 5 10*3/uL (ref 1.7–7.7)
Neutrophils Relative %: 67 %
Platelets: 305 10*3/uL (ref 150–400)
RBC: 2.89 MIL/uL — ABNORMAL LOW (ref 4.22–5.81)
RDW: 13.1 % (ref 11.5–15.5)
WBC: 7.3 10*3/uL (ref 4.0–10.5)
nRBC: 0 % (ref 0.0–0.2)

## 2022-12-17 MED ORDER — IPRATROPIUM-ALBUTEROL 0.5-2.5 (3) MG/3ML IN SOLN
3.0000 mL | Freq: Two times a day (BID) | RESPIRATORY_TRACT | 1 refills | Status: DC
  Filled 2022-12-17: qty 360, 60d supply, fill #0

## 2022-12-17 MED ORDER — DULERA 200-5 MCG/ACT IN AERO
2.0000 | INHALATION_SPRAY | Freq: Two times a day (BID) | RESPIRATORY_TRACT | 1 refills | Status: DC
  Filled 2022-12-17: qty 13, 30d supply, fill #0

## 2022-12-17 MED ORDER — ACETAMINOPHEN 325 MG PO TABS: 650.0000 mg | ORAL_TABLET | Freq: Four times a day (QID) | ORAL | Status: AC | PRN

## 2022-12-17 NOTE — Care Management Important Message (Signed)
Important Message  Patient Details  Name: [name] MRN: [id number] Date of Birth: [birth year]   Medicare Important Message Given:  Yes     Renie Ora 12/17/2022, 11:16 AM

## 2022-12-17 NOTE — Progress Notes (Signed)
Speech Language Pathology Treatment: Dysphagia  Patient Details Name: [name] MRN: [id number] DOB: 1951/02/02 Today's Date: 12/17/2022 Time: 6962-9528 SLP Time Calculation (min) (ACUTE ONLY): 21 min  Assessment / Plan / Recommendation Clinical Impression  Pt seen with portion of breakfast wearing PMV. He needed min verbal prompts for volitional cough after every 2-3 sips/bites. He did have one cough during observation, coughing off valve but was able to donn using mirror and verbal cueing. No other s/s aspiration were present. Pt reminded to wear valve with all meals and to cough intermittently to clear potential penetrates. Pt may be discharged soon and does not qualify for home health as he has no PT/OT/nursing needs per case manager. He will be followed up for trach care in the trach clinic.    HPI HPI: 72 year old male presents to ED 4/17 after several days of gradually worsening SoB. 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 Developed stridor 4/18 and moved to ICU, tracheostomy and biopsy of laryngeal mass.  UXL:KGMW 1 diabetes mellitus, recent COPD diagnosis, coronary artery disease, hyperlipidemia, hypertension and L BKA      SLP Plan  Continue with current plan of care  Patient needs continued Speech Lanaguage Pathology Services   Recommendations for follow up therapy are one component of a multi-disciplinary discharge planning process, led by the attending physician.  Recommendations may be updated based on patient status, additional functional criteria and insurance authorization.    Recommendations  Diet recommendations: Regular;Thin liquid Liquids provided via: Cup;Straw Medication Administration: Whole meds with liquid Supervision: Patient able to self feed Compensations: Slow rate;Small sips/bites;Clear throat intermittently Postural Changes and/or Swallow Maneuvers: Seated upright 90 degrees      Patient may use Passy-Muir Speech Valve: During all waking hours (remove  during sleep) PMSV Supervision: Intermittent           Oral care BID   Intermittent Supervision/Assistance Aphonia (R49.1)     Continue with current plan of care     [name]  12/17/2022, 10:15 AM

## 2022-12-17 NOTE — Progress Notes (Signed)
Discharge instructions given to patient's daughter. Verbalized understanding and all questions were answered.

## 2022-12-17 NOTE — Progress Notes (Signed)
Talked to Rayne with [employer], nebulizer for home ordered and will be delivered to the home today. Jeral Pinch Transition of Care Supervisor [phone]

## 2022-12-17 NOTE — Progress Notes (Signed)
Gave daughter and patient trach education. Went over suction and care.

## 2022-12-17 NOTE — Progress Notes (Addendum)
Physical Therapy Treatment Patient Details Name: [name] MRN: [id number] DOB: 1950/09/28 Today's Date: 12/17/2022   History of Present Illness 72 year old male presents to ED 4/17 after several days of gradually worsening SoB. 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 Developed stridor 4/18 and moved to ICU, tracheostomy and biopsy of laryngeal mass.  NWG:NFAO 1 diabetes mellitus, recent COPD diagnosis, coronary artery disease, hyperlipidemia, hypertension and L BKA    PT Comments    Pt received in supine, agreeable to therapy session with good participation and tolerance for transfer and gait training using cane. 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 Pt states he feels below functional baseline and does demonstrate balance deficits, and also may have trouble getting rides multiple times a week, so would benefit from HHPT for home safety assist/education and fall risk prevention. Pt also had questions regarding trach care/follow-up, RN and SLP notified. Disposition updated below per discussion with pt and supervising PT Zain B. Case mgmt/RN also notified. Pt continues to benefit from PT services to progress toward functional mobility goals.    Recommendations for follow up therapy are one component of a multi-disciplinary discharge planning process, led by the attending physician.  Recommendations may be updated based on patient status, additional functional criteria and insurance authorization.  Follow Up Recommendations       Assistance Recommended at Discharge Intermittent Supervision/Assistance  Patient can return home with the following Help with stairs or ramp for entrance;Assist for transportation (anticipate pt  has needs for trach care/teaching still as he needed reminder to wipe secretions)   Equipment Recommendations  None recommended by PT (pt has 4WW, RW, walking stick)    Recommendations for Other Services OT consult     Precautions / Restrictions Precautions Precautions: Fall Precaution Comments: L BKA prosthetic Restrictions Weight Bearing Restrictions: No     Mobility  Bed Mobility Overal bed mobility: Independent                  Transfers Overall transfer level: Needs assistance Equipment used: Straight cane Transfers: Sit to/from Stand Sit to Stand: Supervision           General transfer comment: Pt standing unassisted using cane and furniture at time. He states this is what he does at home (walking stick). Cues not to use furniture that is on wheels when he tried to use his table.    Ambulation/Gait Ambulation/Gait assistance: Min guard, Supervision Gait Distance (Feet): 250 Feet Assistive device: Straight cane Gait Pattern/deviations: Step-through pattern, Drifts right/left, Wide base of support       General Gait Details: Pt heavily reliant on cane and often reaching out for wall rail/furniture for additional support. 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 SpO2/HR WFL on RA with exertional tasks. x3 standing breaks ~1-2 mins due to pt c/o low back pain.   Stairs Stairs: Yes Stairs assistance: Min guard Stair Management: With cane, Alternating pattern, Step to pattern, Forwards Number of Stairs: 2 (single 7" step x2 reps) General stair comments: cues  for safety and sequencing via step-to pattern, pt ignoring cues on attempt and stepping up with single leg then over step and down with opposite leg rather than performing with step-to  pattern. Pt mildly unsteady but able to stabilize himself using cane and nearby furniture.   Wheelchair Mobility    Modified Rankin (Stroke Patients Only)       Balance Overall balance assessment: Needs assistance Sitting-balance support: Feet supported, No upper extremity supported Sitting balance-Leahy Scale: Good     Standing balance support: No upper extremity supported, During functional activity Standing balance-Leahy Scale: Fair Standing balance comment: Stands statically without RW but balance is impaired, needs cane and sometimes BUE support for dynamic tasks                            Cognition Arousal/Alertness: Awake/alert Behavior During Therapy: WFL for tasks assessed/performed Overall Cognitive Status: Within Functional Limits for tasks assessed                                 General Comments: Pt with PMSV donned and able to make his needs known well, hoarse.        Exercises      General Comments General comments (skin integrity, edema, etc.): SpO2 98% on RA at rest and >92% with exertional tasks when sensor reading achieved. HR 60's bpm with exertion. RR 22 resting, pt cued for standing breaks and OK to doff PMSV PRN if he isn't feeling able to take a deep breath.      Pertinent Vitals/Pain Pain Assessment Pain Assessment: Faces Faces Pain Scale: Hurts little more Pain Location: low back pain Pain Descriptors / Indicators: Grimacing, Guarding, Discomfort Pain Intervention(s): Limited activity within patient's tolerance, Monitored during session, Repositioned    Home Living                          Prior Function            PT Goals (current goals can now be found in the care plan section) Acute Rehab PT Goals Patient Stated Goal: go to his newly renovated home PT Goal Formulation: With patient Time For Goal Achievement: 12/28/22 Progress towards PT goals: Progressing toward goals    Frequency    Min  1X/week      PT Plan Discharge plan needs to be updated    Co-evaluation              AM-PAC PT "6 Clicks" Mobility   Outcome Measure  Help needed turning from your back to your side while in a flat bed without using bedrails?: None Help needed moving from lying on your back to sitting on the side of a flat bed without using bedrails?: None Help needed moving to and from a bed to a chair (including a wheelchair)?: A Little Help needed standing up from a chair using your arms (e.g., wheelchair or bedside chair)?: A Little Help needed to walk in hospital room?: A Little Help needed climbing 3-5 steps with a railing? : A Little 6 Click Score: 20    End of Session Equipment Utilized During Treatment: Gait belt Activity Tolerance: Patient tolerated treatment well Patient left: with call bell/phone within reach;in bed Nurse Communication: Mobility status;Other (comment) (pt asking for HHPT (OK with OPPT if insurance doesnt cover [facility] services)) PT Visit Diagnosis: Unsteadiness on  feet (R26.81);Difficulty in walking, not elsewhere classified (R26.2)     Time: 1000-1039 PT Time Calculation (min) (ACUTE ONLY): 39 min  Charges:  $Gait Training: 8-22 mins $Therapeutic Activity: 8-22 mins $Self Care/Home Management: 8-22                     Laurens Matheny P., PTA Acute Rehabilitation Services Secure Chat Preferred 9a-5:30pm Office: [phone]    Dorathy Kinsman [facility] 12/17/2022, 11:10 AM

## 2022-12-17 NOTE — Discharge Summary (Signed)
Physician Discharge Summary  [name] ZOX:[id number] DOB: Jan 31, 1951 DOA: 12/09/2022  PCP: [name], MD  Admit date: 12/09/2022 Discharge date: 12/17/2022  Time spent: 60 minutes  Recommendations for Outpatient Follow-up:  Follow-up with Dr. Pollyann Kennedy, ENT in 1 week. Follow-up with [name], MD in 2 weeks.  On follow-up patient will need a basic metabolic profile done to follow-up on the electrolytes and renal function. Follow-up with Rubye Oaks, NP, pulmonary on 01/11/2023 at 2 PM.   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  Diet recommendation: Carb modified diet  Filed Weights   12/09/22 0518 12/09/22 1508  Weight: 68 kg 58.6 kg    History of present illness:  HPI per Dr. Lance Bosch is a 72 y.o. male with medical history significant of emphysema, CAD, type 1 diabetes diagnosed 50 years ago, diabetic retinopathy, hyperlipidemia, hypertension, peripheral vascular disease who presented to the emergency department complaints of sudden worsening of recent dyspnea, mostly nonproductive cough, fatigue and wheezing that began last night that did not improve with his albuterol MDI.  He has been having dyspnea for the past 2 weeks, but also stated he has lost about 30 pounds of weight over the last 6 months.  He was seen 12 days ago in the emergency department for similar symptoms.  Since then, he has not felt at baseline.  He denied fever,  chills, rhinorrhea, sore throat or hemoptysis.  No pleuritic or typical chest pain, palpitations, diaphoresis, PND, orthopnea or pitting edema of the lower extremities.  No abdominal pain, nausea, emesis, diarrhea, constipation, melena or hematochezia.  No flank pain, dysuria, frequency or hematuria.  No polyuria, polydipsia, polyphagia or blurred vision.    ED course: Initial vital signs were temperature 97.4 F, pulse 107, respirations 23, BP 158/78 mmHg O2 sat 95% on room air.  The patient received a continuous albuterol 10+ ipratropium 0.5 mg neb, an albuterol 2.5 mg nebs, DuoNeb, magnesium sulfate 2 g IVPB and 10 mg of dexamethasone IVP.   Lab work: CBC 0 white count 12.0, hemoglobin 13.6 g/dL platelets 811.  Venous blood gas was normal SF4 pO2 of 48 mmHg.  CMP showed a chloride of 97 mmol/L and glucose of 183 mg/dL, the rest of the CMP measurements were normal.   Imaging: Portable 1 view chest radiograph with no radiographic evidence of acute cardiopulmonary disease.  There is emphysema and aortic atherosclerosis.  CTA chest done 12 days ago showed no PE and similar findings.  There was also a small hiatal hernia, significant coronary artery calcifications in the left adrenal nodule with recommendations for correlation with prior imaging or dedicated renal imaging in the future.   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 -Tracheostomy in place, ENT was following. 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 -ENT attempted to exchange cuffless tube on 12/14/2022 however it  was noted that nursing staff was unable to obtain 1. -Patient again seen by ENT the evening of 12/16/2022 and tracheostomy tube exchanged and replaced with a Shiley 6 cuffed flex tube.  Stoma noted to be patent. -Patient be  discharged home with tracheostomy supplies and home health. -Oncology navigator notified of patient. -Outpatient follow-up with Dr. Pollyann Kennedy, ENT in 1 to 2 weeks.   2.  Right apical pneumothorax/subcutaneous emphysema -Resolved on chest x-ray. -PCCM signed off and recommended follow-up in 4 to 6 weeks.   3.  History of recently diagnosed COPD/possible CAP versus aspiration -Improved clinically. -Some improvement with copious secretions. -Seen by PCCM recommended continuation of Brovana, budesonide, scheduled nebs. -IV antibiotics have been narrowed down to Augmentin which patient was maintained on during the hospitalization and completed a course of antibiotic treatment.  On day of discharge patient refused any further Augmentin. -Patient be discharged on Dulera, scheduled nebs. -Outpatient follow-up with pulmonary.   4.  Type 1 diabetes mellitus -Hemoglobin A1c 8.0 (12/09/2022) -Patient maintained on home regimen insulin pump.     5.  Peripheral vascular disease status post left BKA/CAD/hypertension -Remained stable.   -Patient maintained on home regimen aspirin, metoprolol, Crestor, Altace.   -Outpatient follow-up.    6.  Left adrenal adenoma -Will need outpatient PET scan for follow-up.   7.  Stage II pretibial ulcer, POA Pressure Injury 12/11/22 Pretibial Left;Proximal Stage 2 -  Partial thickness loss of dermis presenting as a shallow open injury with a red, pink wound bed without slough. (Active)  12/11/22 1053  Location: Pretibial  Location Orientation: Left;Proximal  Staging: Stage 2 -  Partial thickness loss of dermis presenting as a shallow open injury with a red, pink wound bed without slough.  Wound Description (Comments):   Present on Admission: Yes (per pt he has had it for 3 weeks)       Procedures: CT soft tissue neck 12/10/2022 Modified barium swallow 12/15/2022 Tracheostomy under local anesthesia/direct laryngoscopy with biopsy of laryngeal mass under general  anesthesia per Dr. Pollyann Kennedy, ENT 12/10/2022 Tracheostomy tube exchange by ENT, Dr.Hoshal 12/16/2022   Significant Hospital Events: Including procedures, antibiotic start and stop dates in addition to other pertinent events   04/17 - Patient admitted to [facility] for new laryngeal mass 04/18 - Patient had a rapid called for worsening shortness of breath. Patient required to go to ICU closer monitoring.  4/18 - Emergent tracheostomy 4/20 - Added Unasyn 4/22 - Significant secretion burden. SLP consult in place, added PMV eval once appropriate. ?ENT trach change to cuffless today, per note 4/19.  Consultations: ENT: Dr. Jearld Fenton 12/09/2022  PCCM: Dr.Agarwala 12/10/2022  Discharge Exam: Vitals:   12/17/22 1136 12/17/22 1552  BP: (!) 109/45   Pulse: (!) 56 60  Resp: 18   Temp: 98.1 F (36.7 C) 98.1 F (36.7 C)  SpO2: 96% 93%    General: NAD Cardiovascular: RRR no murmurs rubs or gallops.  No JVD.  No lower extremity edema. Respiratory: Clear to auscultation bilaterally.  No wheezes, no crackles, no rhonchi.  Discharge Instructions   Discharge Instructions     Diet Carb Modified   Complete by: As directed    Discharge wound care:   Complete by: As directed    Pressure Injury 12/11/22 Pretibial Left;Proximal Stage 2 -  Partial thickness loss of dermis presenting as a shallow open injury with a red, pink wound bed without slough. 6 days   Increase activity slowly   Complete by: As directed  Allergies as of 12/17/2022       Reactions   Simvastatin Other (See Comments)   MYALGIAS, MUSCLE WEAKNESS        Medication List     TAKE these medications    acetaminophen 325 MG tablet Commonly known as: TYLENOL Take 2 tablets (650 mg total) by mouth every 6 (six) hours as needed for mild pain (or Fever >/= 101).   albuterol 108 (90 Base) MCG/ACT inhaler Commonly known as: VENTOLIN HFA Inhale 1-2 puffs into the lungs every 6 (six) hours as needed for wheezing or shortness of  breath. What changed: how much to take   aspirin EC 81 MG tablet Take 325 mg by mouth at bedtime.   baclofen 20 MG tablet Commonly known as: LIORESAL Take 20 mg by mouth daily as needed (for leg cramps). Reported on 12/05/2015   calcium carbonate 500 MG chewable tablet Commonly known as: TUMS - dosed in mg elemental calcium Chew 1,000-2,000 mg by mouth as needed for indigestion or heartburn.   Dulera 200-5 MCG/ACT Aero Generic drug: mometasone-formoterol Inhale 2 puffs into the lungs 2 (two) times daily.   folic acid 800 MCG tablet Commonly known as: FOLVITE Take 1,600 mcg by mouth at bedtime.   HumaLOG 100 UNIT/ML injection Generic drug: insulin lispro 2.1 mLs See admin instructions. Via PUMP Inject 2.1 ml into the pump when it runs out. Pt is unsure how often he fills his pump or the rate of his doses.   ibuprofen 200 MG tablet Commonly known as: ADVIL Take 400 mg by mouth as needed for moderate pain.   ipratropium-albuterol 0.5-2.5 (3) MG/3ML Soln Commonly known as: DUONEB Take 3 mLs by nebulization 2 (two) times daily.   metoprolol succinate 25 MG 24 hr tablet Commonly known as: TOPROL-XL Take 1 tablet (25 mg total) by mouth daily. What changed: when to take this   MULTIVITAMIN GUMMIES ADULT PO Take 3 tablets by mouth at bedtime.   ramipril 5 MG capsule Commonly known as: ALTACE Take 5 mg by mouth at bedtime.   rosuvastatin 10 MG tablet Commonly known as: CRESTOR TAKE 1 TABLET BY MOUTH  DAILY What changed: when to take this               Durable Medical Equipment  (From admission, onward)           Start     Ordered   12/17/22 1525  For home use only DME Nebulizer machine  Once       Question Answer Comment  Patient needs a nebulizer to treat with the following condition COPD (chronic obstructive pulmonary disease)   Length of Need Lifetime      12/17/22 1524   12/17/22 0938  For home use only DME Trach supplies  (For Home Use Only DME  Trach Supplies)  Once       Question Answer Comment  Trach Type Shiley   Back Up Trach Type Shiley   Cuffed or Uncuffed Uncuffed   Fenestrated No   Size 6 and 4   XLT Length No   Trach Supplies/Equipment for Home Use Tube Holder/Collar   Trach Supplies/Equipment for Home Use Tracheostomy Drain Dressings   Trach Supplies/Equipment for Home Use Medical Suction Machine   Trach Supplies/Equipment for Home Use Suction Catheters   Trach Supplies/Equipment for Home Use Tracheostomy Care Cleaning Kits   Trach Supplies/Equipment for Home Use Speaking Valve   Trach Supplies/Equipment for Home Use HME Device   Trach Supplies/Equipment for  Home Use Humidification (oxygen 5 liters via trach collar to provide specified % - if tracheostomy is capped with occlusive cap during day, a separate Fowler order will need to be provided)   Suction Catheter Size 14 French (for size 6 or higher)   Cleaning Kits 60   Oxygen % 30%      12/17/22 0940              Discharge Care Instructions  (From admission, onward)           Start     Ordered   12/17/22 0000  Discharge wound care:       Comments: Pressure Injury 12/11/22 Pretibial Left;Proximal Stage 2 -  Partial thickness loss of dermis presenting as a shallow open injury with a red, pink wound bed without slough. 6 days   12/17/22 1523           Allergies  Allergen Reactions   Simvastatin Other (See Comments)    MYALGIAS, MUSCLE WEAKNESS     Follow-up Information     Serena Colonel, MD. Schedule an appointment as soon as possible for a visit in 1 week(s).   Specialty: Otolaryngology Contact information: [address] [place] [address] [phone]         [facility] Follow up.   Why: (Adapt)- Trach Supplies have been ordered and shipped (2-3 day delivery)- home suction and humidification arranged- to be delivered to home day of discharge (4/25) Contact information: [address] [place]  [address] [phone]         [name], MD. Schedule an appointment as soon as possible for a visit in 2 week(s).   Specialty: Internal Medicine Contact information: [address] [place] [address] [phone]         Julio Sicks, NP Follow up on 01/11/2023.   Specialty: Pulmonary Disease Why: Follow-up as scheduled at 2 PM Contact information: [address] [place] [address] [phone]                  The results of significant diagnostics from this hospitalization (including imaging, microbiology, ancillary and laboratory) are listed below for reference.    Significant Diagnostic Studies: DG Swallowing Func-Speech Pathology  Result Date: 12/15/2022 Table formatting from the original result was not included. Modified Barium Swallow Study Patient Details Name: [name] MRN: [id number] Date of Birth: 11-16-1950 Today's Date: 12/15/2022 HPI/PMH: HPI: 72 year old male presents to ED 4/17 after several days of gradually worsening SoB. 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 Developed stridor 4/18 and moved to ICU, tracheostomy and biopsy of laryngeal mass.  AYT:KZSW 1 diabetes mellitus, recent COPD diagnosis, coronary artery disease, hyperlipidemia, hypertension and L BKA Clinical Impression: Clinical Impression: Pt's MBS completed without a PMV due to trach not yet changed to uncuffed prior to [facility] evaluation per ENT orders. He demonstrated mild oropharyngeal dysphagia marked by decreased oral transit with pt compensating by  thrusting/extending head to assist in oral transit and occasional minimal lingual residue. Pt noted to have reduced laryngeal elevation and epiglottic deflection resulting in penetration with thin barium before full closure could occur. Most penetration instances with thin were initially ejected from vestibule during the swallow however barium on upper 1/3 of epiglottis began to penetrate midway into vestiuble without awareness  after the swallow. Pt then penetrated to vocal cords with thin. Gloved finger occlusion with cough helped mobilize penetrate to upper epiglottis. Chin tuck resulted in  silent aspiration and right head turn was ineffective. Nectar thick penetrated to the cords and finger occlusion with cough moved barium higher in vestibule. Mastication and transit of solid was functional. Recommend pt continue regular texture, thin liquid with gloved finger occlusion and strong cough after every 2-3 sips liquid, pills whole in puree. Pt is being worked up for laryngeal mass and treatment plan is unknown to this SLP. Recommend follow up with home health ST for continuation of strategies and assist during treatment process. Factors that may increase risk of adverse event in presence of aspiration Rubye Oaks & Clearance Coots 2021): Factors that may increase risk of adverse event in presence of aspiration Rubye Oaks & Clearance Coots 2021): Presence of tubes (ETT, trach, NG, etc.) Recommendations/Plan: Swallowing Evaluation Recommendations Swallowing Evaluation Recommendations Recommendations: PO diet PO Diet Recommendation: Regular; Thin liquids (Level 0) Liquid Administration via: Cup Medication Administration: Whole meds with puree Supervision: Patient able to self-feed Swallowing strategies  : Slow rate; Small bites/sips; Multiple dry swallows after each bite/sip (finger occlude trach and cough) Postural changes: Position pt fully upright for meals Oral care recommendations: Oral care BID (2x/day) Treatment Plan Treatment Plan Treatment recommendations: Therapy as outlined in treatment plan below Follow-up recommendations: Home health SLP Functional status assessment: Patient has had a recent decline in their functional status and demonstrates the ability to make significant improvements in function in a reasonable and predictable amount of time. Treatment frequency: Min 2x/week Treatment duration: 2 weeks Interventions: Compensatory techniques;  Patient/family education; Diet toleration management by SLP Recommendations Recommendations for follow up therapy are one component of a multi-disciplinary discharge planning process, led by the attending physician.  Recommendations may be updated based on patient status, additional functional criteria and insurance authorization. Assessment: Orofacial Exam: Orofacial Exam Oral Cavity: Oral Hygiene: WFL Oral Cavity - Dentition: Adequate natural dentition Orofacial Anatomy: WFL Oral Motor/Sensory Function: WFL Anatomy: Anatomy: Other (Comment) (possible fullness around vocal cords -difficult to view) Boluses Administered: Boluses Administered Boluses Administered: Thin liquids (Level 0); Mildly thick liquids (Level 2, nectar thick); Puree; Solid  Oral Impairment Domain: Oral Impairment Domain Lip Closure: No labial escape Tongue control during bolus hold: Cohesive bolus between tongue to palatal seal Bolus preparation/mastication: Timely and efficient chewing and mashing Bolus transport/lingual motion: Brisk tongue motion (extended head back to help transit bolus) Oral residue: Trace residue lining oral structures Location of oral residue : Tongue Initiation of pharyngeal swallow : Pyriform sinuses; Valleculae  Pharyngeal Impairment Domain: Pharyngeal Impairment Domain Soft palate elevation: No bolus between soft palate (SP)/pharyngeal wall (PW) Laryngeal elevation: Partial superior movement of thyroid cartilage/partial approximation of arytenoids to epiglottic petiole Anterior hyoid excursion: Complete anterior movement Epiglottic movement: Partial inversion Laryngeal vestibule closure: Incomplete, narrow column air/contrast in laryngeal vestibule Pharyngeal stripping wave : Present - diminished Pharyngeal contraction (A/P view only): N/A Pharyngoesophageal segment opening: Complete distension and complete duration, no obstruction of flow Tongue base retraction: Narrow column of contrast or air between tongue base  and PPW Pharyngeal residue: Collection of residue within or on pharyngeal structures Location of pharyngeal residue: Valleculae; Pyriform sinuses; Aryepiglottic folds; Pharyngeal wall  Esophageal Impairment Domain: Esophageal Impairment Domain Esophageal clearance upright position: Complete clearance, esophageal coating Pill: Esophageal Impairment Domain Esophageal clearance upright position: Complete clearance, esophageal coating Penetration/Aspiration Scale Score: Penetration/Aspiration Scale Score 2.  Material enters airway, remains ABOVE vocal cords then ejected out: Thin liquids (Level 0); Solid; Puree; Mildly thick liquids (Level 2, nectar thick) 3.  Material enters airway, remains ABOVE vocal cords and not ejected out:  Thin liquids (Level 0); Mildly thick liquids (Level 2, nectar thick) 8.  Material enters airway, passes BELOW cords without attempt by patient to eject out (silent aspiration) : Thin liquids (Level 0) (with a chin tuck) Compensatory Strategies: Compensatory Strategies Compensatory strategies: Yes Chin tuck: Ineffective Ineffective Chin Tuck: Thin liquid (Level 0) Right head turn: Ineffective Ineffective Right Head Turn: Thin liquid (Level 0)   General Information: Caregiver present: No  Diet Prior to this Study: Regular; Thin liquids (Level 0)   Temperature : Normal   Respiratory Status: WFL   Supplemental O2: Trach Collar   History of Recent Intubation: No  Behavior/Cognition: Alert; Cooperative Self-Feeding Abilities: Able to self-feed Baseline vocal quality/speech: -- (no pmv- no voicing with finger occlusion) Volitional Cough: -- (some air movement with finger occlusion) Volitional Swallow: Able to elicit No data recorded Goal Planning: Prognosis for improved oropharyngeal function: -- (fair-good) No data recorded No data recorded No data recorded Consulted and agree with results and recommendations: Patient Pain: Pain Assessment Pain Assessment: No/denies pain Faces Pain Scale: 4 Pain  Location: Throat Pain Descriptors / Indicators: Grimacing; Guarding; Discomfort Pain Intervention(s): Limited activity within patient's tolerance; Monitored during session End of Session: Start Time:SLP Start Time (ACUTE ONLY): 1314 Stop Time: SLP Stop Time (ACUTE ONLY): 1331 Time Calculation:SLP Time Calculation (min) (ACUTE ONLY): 17 min Charges: SLP Evaluations $ SLP Speech Visit: 1 Visit SLP Evaluations $BSS Swallow: 1 Procedure $MBS Swallow: 1 Procedure SLP visit diagnosis: SLP Visit Diagnosis: Dysphagia, oropharyngeal phase (R13.12) Past Medical History: Past Medical History: Diagnosis Date  Cigarette smoker   Coronary artery disease   Diabetes mellitus   type 1  x 50 yrs  Diabetic retinopathy   Dyslipidemia   Hypertension   Peripheral vascular disease  Past Surgical History: Past Surgical History: Procedure Laterality Date  ABDOMINAL ANGIOGRAM N/A 05/29/2014  Procedure: ABDOMINAL ANGIOGRAM;  Surgeon: Pamella Pert, MD;  Location: [facility] CATH LAB;  Service: Cardiovascular;  Laterality: N/A;  ABDOMINAL AORTOGRAM W/LOWER EXTREMITY N/A 11/14/2019  Procedure: ABDOMINAL AORTOGRAM W/LOWER EXTREMITY;  Surgeon: Nada Libman, MD;  Location: [facility] INVASIVE CV LAB;  Service: Cardiovascular;  Laterality: N/A;  AMPUTATION Left 10/25/2015  Procedure: Left Foot 5th Ray Amputation;  Surgeon: Nadara Mustard, MD;  Location: [facility] OR;  Service: Orthopedics;  Laterality: Left;  AMPUTATION Left 12/06/2015  Procedure: AMPUTATION BELOW KNEE;  Surgeon: Nadara Mustard, MD;  Location: [facility] OR;  Service: Orthopedics;  Laterality: Left;  CARDIAC CATHETERIZATION    EF is 55-60% and no wall motion abnormalities (long long time ago)  DIRECT LARYNGOSCOPY N/A 12/10/2022  Procedure: DIRECT LARYNGOSCOPY WITH BIOPSY;  Surgeon: Serena Colonel, MD;  Location: [facility] OR;  Service: ENT;  Laterality: N/A;  FEMORAL-POPLITEAL BYPASS GRAFT Left 10/24/2015  Procedure: BYPASS GRAFT FEMORAL below knee POPLITEAL ARTERY with Left Saphenous Vein;  Surgeon: Nada Libman, MD;   Location: [facility] OR;  Service: Vascular;  Laterality: Left;  FEMORAL-POPLITEAL BYPASS GRAFT Right 11/15/2019  Procedure: BYPASS GRAFT FEMORAL-POPLITEAL ARTERY using Right Leg Greater Saphenous Vein;  Surgeon: Nada Libman, MD;  Location: [facility] OR;  Service: Vascular;  Laterality: Right;  FRACTURE SURGERY    left arm "many yrs ago"  LOWER EXTREMITY ANGIOGRAM N/A 01/23/2014  Procedure: LOWER EXTREMITY ANGIOGRAM;  Surgeon: Pamella Pert, MD;  Location: [facility] CATH LAB;  Service: Cardiovascular;  Laterality: N/A;  LOWER EXTREMITY ANGIOGRAM N/A 07/31/2014  Procedure: LOWER EXTREMITY ANGIOGRAM;  Surgeon: Pamella Pert, MD;  Location: [facility] CATH LAB;  Service: Cardiovascular;  Laterality: N/A;  LOWER EXTREMITY ANGIOGRAM Left 10/11/2015  Procedure: Lower Extremity Angiogram;  Surgeon: Nada Libman, MD;  Location: [facility] INVASIVE CV LAB;  Service: Cardiovascular;  Laterality: Left;  PERIPHERAL VASCULAR BALLOON ANGIOPLASTY  11/14/2019  Procedure: PERIPHERAL VASCULAR BALLOON ANGIOPLASTY;  Surgeon: Nada Libman, MD;  Location: [facility] INVASIVE CV LAB;  Service: Cardiovascular;;  PERIPHERAL VASCULAR CATHETERIZATION Left 10/11/2015  Procedure: Peripheral Vascular Balloon Angioplasty;  Surgeon: Nada Libman, MD;  Location: [facility] INVASIVE CV LAB;  Service: Cardiovascular;  Laterality: Left;  sfa failed unable to cross occluded sfa  PERIPHERAL VASCULAR CATHETERIZATION N/A 10/11/2015  Procedure: Abdominal Aortogram;  Surgeon: Nada Libman, MD;  Location: [facility] INVASIVE CV LAB;  Service: Cardiovascular;  Laterality: N/A;  STUMP REVISION Left 03/02/2016  Procedure: Revision Left Below Knee Amputation;  Surgeon: Nadara Mustard, MD;  Location: [facility] OR;  Service: Orthopedics;  Laterality: Left;  TRACHEOSTOMY TUBE PLACEMENT N/A 12/10/2022  Procedure: TRACHEOSTOMY UNDER LOCAL;  Surgeon: Serena Colonel, MD;  Location: [facility] OR;  Service: ENT;  Laterality: N/A; [name] 12/15/2022, 2:46 PM  DG Chest Port 1 View  Result Date: 12/15/2022 CLINICAL  DATA:  Encounter for pneumothorax. EXAM: PORTABLE CHEST 1 VIEW COMPARISON:  12/13/2022 FINDINGS: Persistent lucency at the right lung apex is compatible with a small pneumothorax which is minimally changed. Again noted is subcutaneous air on the left side of the chest. There is decreased subcutaneous air in the lower neck regions. Patient has a tracheostomy tube. Heart size is stable. Patchy interstitial densities in the lower lungs, right side greater than left. IMPRESSION: 1. Stable small right apical pneumothorax. 2. Slightly decreased subcutaneous air. 3. Patchy interstitial densities in both lungs. Findings are nonspecific but could represent atelectasis or interstitial edema. Atypical infection cannot be excluded. Recommend continued follow-up. Electronically Signed   By: Richarda Overlie M.D.   On: 12/15/2022 09:43   DG Chest Port 1 View  Result Date: 12/13/2022 CLINICAL DATA:  Follow-up pneumothorax. EXAM: PORTABLE CHEST 1 VIEW COMPARISON:  Multiple recent chest x-rays. FINDINGS: The tracheostomy tube is stable. Small residual right apical pneumothorax, less than 5%. Persistent but improving subcutaneous emphysema and pneumomediastinum. Stable advanced emphysematous changes and pulmonary scarring. IMPRESSION: 1. Small residual right apical pneumothorax, less than 5%. 2. Persistent but improving subcutaneous emphysema and pneumomediastinum. Electronically Signed   By: Rudie Meyer M.D.   On: 12/13/2022 09:10   DG CHEST PORT 1 VIEW  Result Date: 12/12/2022 CLINICAL DATA:  Follow-up pneumothorax EXAM: PORTABLE CHEST 1 VIEW COMPARISON:  12/11/2022 FINDINGS: Cardiac shadow is within normal limits. Tracheostomy tube is noted in satisfactory position. Lungs are well aerated bilaterally. Small right-sided pneumothorax is again noted and stable. Subcutaneous emphysema is seen. No focal confluent infiltrate is noted. IMPRESSION: Stable right pneumothorax. Electronically Signed   By: Alcide Clever M.D.   On:  12/12/2022 09:04   DG CHEST PORT 1 VIEW  Result Date: 12/11/2022 CLINICAL DATA:  [id number] with pneumothorax and tracheostomy. Shortness of breath. Evaluate airspace disease and pleural effusions. EXAM: PORTABLE CHEST 1 VIEW COMPARISON:  Portable chest yesterday at 9:50 p.m. FINDINGS: 4:31 a.m. Tracheostomy cannula again terminates about 6.4 cm from the carina, unchanged. Left chest wall and bilateral supraclavicular soft tissue emphysema continue to be seen and show mild improvement. At least a small pneumomediastinum continues to be noted along the left heart border and may have improved. There is a small right apical pneumothorax estimated 5% of the chest volume and seems grossly unchanged, without mediastinal shift. There is no appreciable  left pneumothorax. The lungs are generally clear. The cardiomediastinal silhouette is normal apart from the pneumomediastinum. IMPRESSION: 1. Small right apical pneumothorax, estimated 5% of the chest volume, seems grossly unchanged without mediastinal shift. 2. Left chest wall and bilateral supraclavicular soft tissue emphysema continue to be seen and show mild improvement. 3. Small pneumomediastinum may have improved. 4. No consolidation or effusion is seen. Electronically Signed   By: Almira Bar M.D.   On: 12/11/2022 06:31   DG CHEST PORT 1 VIEW  Addendum Date: 12/10/2022   ADDENDUM REPORT: 12/10/2022 22:57 ADDENDUM: Critical Value/emergent results were called by telephone at the time of interpretation on 12/10/2022 at 10:56 pm to provider ADITYA PALIWAL , who verbally acknowledged these results. Electronically Signed   By: Minerva Fester M.D.   On: 12/10/2022 22:57   Result Date: 12/10/2022 CLINICAL DATA:  Hypoxia EXAM: PORTABLE CHEST 1 VIEW COMPARISON:  Chest radiograph 12/09/2022 FINDINGS: New tracheostomy tube with tip in the mid intrathoracic trachea. There is new pneumomediastinum. Cardiomediastinal silhouette is otherwise stable. Small right apical  pneumothorax. No evidence of tension morphology. Extensive subcutaneous emphysema through the left chest wall and bilateral neck. No focal consolidation or pleural effusion. IMPRESSION: 1. New tracheostomy tube with tip in the mid intrathoracic trachea. 2. New small right apical pneumothorax. 3. New pneumomediastinum. 4. Extensive subcutaneous emphysema throughout the left chest wall and bilateral neck. Electronically Signed: By: Minerva Fester M.D. On: 12/10/2022 22:53   CT SOFT TISSUE NECK W CONTRAST  Result Date: 12/10/2022 CLINICAL DATA:  Vocal cord mass EXAM: CT NECK WITH CONTRAST TECHNIQUE: Multidetector CT imaging of the neck was performed using the standard protocol following the bolus administration of intravenous contrast. RADIATION DOSE REDUCTION: This exam was performed according to the departmental dose-optimization program which includes automated exposure control, adjustment of the mA and/or kV according to patient size and/or use of iterative reconstruction technique. CONTRAST:  75mL OMNIPAQUE IOHEXOL 350 MG/ML SOLN COMPARISON:  None Available. FINDINGS: Pharynx and larynx: There is thickening of the bilateral vocal cords, right-greater-than-left. There is medialization of the right vocal fold (series 3, image 74). There is an exophytic nodule arising from the right vocal fold (series 7, image 83/series 5, image 83) measuring 5 x 9 x 7 mm. The epiglottis is normal in appearance. No evidence of tonsillar hypertrophy. Salivary glands: No inflammation, mass, or stone. Thyroid: Normal. Lymph nodes: No evidence of cervical lymphadenopathy. Vascular: Mild narrowing of the origin of bilateral ICAs Limited intracranial: Negative. Visualized orbits: Negative. Mastoids and visualized paranasal sinuses: Clear. Skeleton: Degenerative changes in the cervical spine without focal osseous lesions. Upper chest: Mild centrilobular emphysema. Biapical pleural-parenchymal scarring. Other: None IMPRESSION: 1.  Thickening of the bilateral vocal cords, right-greater-than-left, with medialization of the right vocal fold and an exophytic nodule arising from the right vocal fold measuring 5 x 9 x 7 mm. 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 2. No evidence of cervical lymphadenopathy. 3. Emphysema. Emphysema (ICD10-J43.9). Electronically Signed   By: Lorenza Cambridge M.D.   On: 12/10/2022 11:32   DG Chest Portable 1 View  Result Date: 12/09/2022 CLINICAL DATA:  72 year old male with history of shortness of breath. EXAM: PORTABLE CHEST 1 VIEW COMPARISON:  Chest x-ray 11/27/2022. FINDINGS: Lung volumes are normal. Emphysematous changes are noted. No consolidative airspace disease. No pleural effusions. No pneumothorax. No pulmonary nodule or mass noted. Pulmonary vasculature and the cardiomediastinal silhouette are within normal limits. Atherosclerosis in the thoracic aorta. IMPRESSION: 1. No radiographic evidence of acute cardiopulmonary disease. 2.  Emphysema. 3. Aortic atherosclerosis. Electronically Signed   By: Trudie Reed M.D.   On: 12/09/2022 05:57   CT Angio Chest PE W and/or Wo Contrast  Result Date: 11/27/2022 CLINICAL DATA:  Shortness of breath, COPD and wheezing EXAM: CT ANGIOGRAPHY CHEST WITH CONTRAST TECHNIQUE: Multidetector CT imaging of the chest was performed using the standard protocol during bolus administration of intravenous contrast. Multiplanar CT image reconstructions and MIPs were obtained to evaluate the vascular anatomy. RADIATION DOSE REDUCTION: This exam was performed according to the departmental dose-optimization program which includes automated exposure control, adjustment of the mA and/or kV according to patient size and/or use of iterative reconstruction technique. CONTRAST:  75mL OMNIPAQUE IOHEXOL 350 MG/ML SOLN COMPARISON:  Chest x-ray earlier 11/27/2022 and older FINDINGS: Cardiovascular: Coronary artery calcifications are seen. Heart is nonenlarged. No pericardial effusion.  Scattered mild atherosclerotic calcified plaque along the thoracic aorta and branch vessels. Breathing motion identified particularly along the lower lung zones. In this location evaluation for small and peripheral emboli are nondiagnostic. No segmental or larger pulmonary embolism identified. Mediastinum/Nodes: Small hiatal hernia. Patulous esophagus. No specific abnormal lymph node enlargement present in the axillary region, hilum or mediastinum. Lungs/Pleura: Apical pleural thickening. No consolidation, pneumothorax or effusion. Centrilobular emphysematous lung changes are identified greatest along the lower lung zones. There also some air cysts. Upper Abdomen: The right adrenal gland is preserved. The left is slightly thickened. One focal nodular area measures 11 mm on series 4, image 104 and has Hounsfield units of 26. Based on appearance [name] an adenoma but not confirmatory for such. Musculoskeletal: Degenerative changes are seen along the spine. Review of the MIP images confirms the above findings. IMPRESSION: Breathing motion limits evaluation. No segmental or larger pulmonary embolism. Emphysematous lung changes. Significant coronary artery calcifications. Small hiatal hernia. Left adrenal nodule. Probable adenoma but not confirmed as such on this examination. Please correlate with any prior imaging or dedicated adrenal imaging when appropriate to confirm etiology Aortic Atherosclerosis (ICD10-I70.0) and Emphysema (ICD10-J43.9). Electronically Signed   By: Karen Kays M.D.   On: 11/27/2022 14:09   ECHOCARDIOGRAM COMPLETE  Result Date: 11/27/2022    ECHOCARDIOGRAM REPORT   Patient Name:   AMBER GUTHRIDGE Date of Exam: 11/27/2022 Medical Rec #:  [id number]       Height:       71.0 in Accession #:    [id number]      Weight:       150.0 lb Date of Birth:  [birth year]      BSA:          1.866 m Patient Age:    71 years        BP:           109/54 mmHg Patient Gender: M               HR:           66 bpm. Exam  Location:  Inpatient Procedure: 2D Echo, Cardiac Doppler and Color Doppler Indications:    R07.9* Chest pain, unspecified. Elevated troponin.  History:        Patient has no prior history of Echocardiogram examinations.                 CAD; Risk Factors:Diabetes and Dyslipidemia.  Sonographer:    Sheralyn Boatman RDCS Referring Phys: [phone] SCOTT GOLDSTON  Sonographer Comments: Technically difficult study due to poor echo windows, suboptimal apical window and suboptimal parasternal window. Image acquisition challenging due to respiratory motion.  Difficult exam due to respiratory motion. IMPRESSIONS  1. Left ventricular ejection fraction, by estimation, is >75%. The left ventricle has hyperdynamic function. The left ventricle has no regional wall motion abnormalities. Left ventricular diastolic parameters were normal.  2. Right ventricular systolic function is normal. The right ventricular size is normal.  3. The mitral valve is degenerative. Trivial mitral valve regurgitation. No evidence of mitral stenosis.  4. The aortic valve was not well visualized. Aortic valve regurgitation is not visualized. No aortic stenosis is present. FINDINGS  Left Ventricle: Left ventricular ejection fraction, by estimation, is >75%. The left ventricle has hyperdynamic function. The left ventricle has no regional wall motion abnormalities. The left ventricular internal cavity size was normal in size. There is no left ventricular hypertrophy. Left ventricular diastolic parameters were normal. Right Ventricle: The right ventricular size is normal. No increase in right ventricular wall thickness. Right ventricular systolic function is normal. Left Atrium: Left atrial size was normal in size. Right Atrium: Right atrial size was normal in size. Pericardium: There is no evidence of pericardial effusion. Mitral Valve: The mitral valve is degenerative in appearance. Normal mobility of the mitral valve leaflets. Mild mitral annular calcification. Trivial  mitral valve regurgitation. No evidence of mitral valve stenosis. Tricuspid Valve: The tricuspid valve is grossly normal. Tricuspid valve regurgitation is not demonstrated. No evidence of tricuspid stenosis. Aortic Valve: The aortic valve was not well visualized. Aortic valve regurgitation is not visualized. No aortic stenosis is present. Pulmonic Valve: The pulmonic valve was not well visualized. Pulmonic valve regurgitation is not visualized. No evidence of pulmonic stenosis. Aorta: The aortic root is normal in size and structure and the ascending aorta was not well visualized. IAS/Shunts: The interatrial septum was not well visualized.  LEFT VENTRICLE PLAX 2D LVIDd:         4.60 cm     Diastology LVIDs:         3.20 cm     LV e' medial:    7.40 cm/s LV PW:         0.70 cm     LV E/e' medial:  8.8 LV IVS:        0.80 cm     LV e' lateral:   11.30 cm/s LVOT diam:     2.10 cm     LV E/e' lateral: 5.8 LV SV:         72 LV SV Index:   39 LVOT Area:     3.46 cm  LV Volumes (MOD) LV vol d, MOD A2C: 44.9 ml LV vol d, MOD A4C: 93.4 ml LV vol s, MOD A2C: 13.5 ml LV vol s, MOD A4C: 19.6 ml LV SV MOD A2C:     31.4 ml LV SV MOD A4C:     93.4 ml LV SV MOD BP:      51.7 ml RIGHT VENTRICLE             IVC RV S prime:     10.20 cm/s  IVC diam: 1.30 cm TAPSE (M-mode): 2.2 cm LEFT ATRIUM           Index       RIGHT ATRIUM           Index LA diam:      2.60 cm 1.39 cm/m  RA Area:     10.20 cm LA Vol (A2C): 14.3 ml 7.66 ml/m  RA Volume:   20.40 ml  10.93 ml/m LA Vol (A4C): 17.1 ml 9.16 ml/m  AORTIC VALVE LVOT Vmax:   99.80 cm/s LVOT Vmean:  66.100 cm/s LVOT VTI:    0.209 m  AORTA Ao Root diam: 3.10 cm MITRAL VALVE MV Area (PHT): 2.61 cm    SHUNTS MV Decel Time: 291 msec    Systemic VTI:  0.21 m MV E velocity: 65.05 cm/s  Systemic Diam: 2.10 cm MV A velocity: 93.25 cm/s MV E/A ratio:  0.70 Sunit Tolia DO Electronically signed by Tessa Lerner DO Signature Date/Time: 11/27/2022/11:00:31 AM    Final    DG Chest Port 1  View  Result Date: 11/27/2022 CLINICAL DATA:  Wheezing, shortness of breath, COPD. EXAM: PORTABLE CHEST 1 VIEW COMPARISON:  AP chest 02/25/2016 FINDINGS: There is overlying monitor wiring. The heart size and mediastinal contours are within normal limits. There is progressive subpleural reticulation in the lung bases. No focal pneumonia is seen. The lungs are otherwise clear. The visualized skeletal structures are unremarkable. IMPRESSION: Progressive subpleural reticulation in the lung bases consistent with progressive interstitial lung disease. Electronically Signed   By: Almira Bar M.D.   On: 11/27/2022 05:59    Microbiology: Recent Results (from the past 240 hour(s))  Respiratory (~20 pathogens) panel by PCR     Status: None   Collection Time: 12/09/22  3:29 PM   Specimen: Nasopharyngeal Swab; Respiratory  Result Value Ref Range Status   Adenovirus NOT DETECTED NOT DETECTED Final   Coronavirus 229E NOT DETECTED NOT DETECTED Final    Comment: (NOTE) The Coronavirus on the Respiratory Panel, DOES NOT test for the novel  Coronavirus (2019 nCoV)    Coronavirus HKU1 NOT DETECTED NOT DETECTED Final   Coronavirus NL63 NOT DETECTED NOT DETECTED Final   Coronavirus OC43 NOT DETECTED NOT DETECTED Final   Metapneumovirus NOT DETECTED NOT DETECTED Final   Rhinovirus / Enterovirus NOT DETECTED NOT DETECTED Final   Influenza A NOT DETECTED NOT DETECTED Final   Influenza B NOT DETECTED NOT DETECTED Final   Parainfluenza Virus 1 NOT DETECTED NOT DETECTED Final   Parainfluenza Virus 2 NOT DETECTED NOT DETECTED Final   Parainfluenza Virus 3 NOT DETECTED NOT DETECTED Final   Parainfluenza Virus 4 NOT DETECTED NOT DETECTED Final   Respiratory Syncytial Virus NOT DETECTED NOT DETECTED Final   Bordetella pertussis NOT DETECTED NOT DETECTED Final   Bordetella Parapertussis NOT DETECTED NOT DETECTED Final   Chlamydophila pneumoniae NOT DETECTED NOT DETECTED Final   Mycoplasma pneumoniae NOT DETECTED NOT  DETECTED Final    Comment: Performed at [facility], [address], [place], [place] [address]  SARS Coronavirus 2 by RT PCR (hospital order, performed in [facility] hospital lab) *cepheid single result test* Anterior Nasal Swab     Status: None   Collection Time: 12/10/22  1:56 PM   Specimen: Anterior Nasal Swab  Result Value Ref Range Status   SARS Coronavirus 2 by RT PCR NEGATIVE NEGATIVE Final    Comment: Performed at [facility], [address], [place], [place] [address]  MRSA Next Gen by PCR, Nasal     Status: None   Collection Time: 12/10/22  6:18 PM   Specimen: Nasal Mucosa; Nasal Swab  Result Value Ref Range Status   MRSA by PCR Next Gen NOT DETECTED NOT DETECTED Final    Comment: (NOTE) The GeneXpert MRSA Assay (FDA approved for NASAL specimens only), is one component of a comprehensive MRSA colonization surveillance program. It is not intended to diagnose MRSA infection nor to guide or monitor treatment for  MRSA infections. Test performance is not FDA approved in patients less than 36 years old. Performed at [facility], [address] [place], [place] [address]   Culture, Respiratory w Gram Stain     Status: None   Collection Time: 12/12/22  2:45 PM   Specimen: Tracheal Aspirate; Respiratory  Result Value Ref Range Status   Specimen Description TRACHEAL ASPIRATE  Final   Special Requests NONE  Final   Gram Stain   Final    RARE WBC PRESENT, PREDOMINANTLY PMN MODERATE GRAM NEGATIVE RODS FEW GRAM POSITIVE COCCI    Culture   Final    MODERATE Normal respiratory flora-no Staph aureus or Pseudomonas seen Performed at [facility], [address], [place], [place] [address]    Report Status 12/14/2022 FINAL  Final     Labs: Basic Metabolic Panel: Recent Labs  Lab 12/11/22 0055 12/12/22 0134 12/13/22 1243 12/14/22 0133 12/16/22 0534 12/17/22 0133  NA 136 133* 136 135  --  134*  K 4.8 4.4 3.9 3.7  --  4.2  CL 101 97* 99 97*  --   100  CO2 25 27 26 27   --  27  GLUCOSE 150* 165* 177* 238*  --  282*  BUN 24* 26* 27* 24*  --  20  CREATININE 0.81 0.99 1.00 0.80 0.75 0.72  CALCIUM 8.7* 8.6* 8.4* 8.2*  --  8.1*  PHOS  --   --  2.6 2.6  --   --    Liver Function Tests: Recent Labs  Lab 12/13/22 1243 12/14/22 0133  ALBUMIN 2.5* 2.2*   No results for input(s): "LIPASE", "AMYLASE" in the last 168 hours. No results for input(s): "AMMONIA" in the last 168 hours. CBC: Recent Labs  Lab 12/11/22 0055 12/12/22 0134 12/13/22 1243 12/14/22 0133 12/17/22 0133  WBC 18.4* 14.7* 11.2* 7.9 7.3  NEUTROABS 17.2*  --  9.0* 5.8 5.0  HGB 12.7* 13.2 12.0* 11.4* 9.6*  HCT 36.6* 37.2* 35.0* 32.6* 26.6*  MCV 93.8 93.0 94.9 93.1 92.0  PLT 349 335 295 275 305   Cardiac Enzymes: No results for input(s): "CKTOTAL", "CKMB", "CKMBINDEX", "TROPONINI" in the last 168 hours. BNP: BNP (last 3 results) Recent Labs    11/27/22 0524  BNP 36.7    ProBNP (last 3 results) No results for input(s): "PROBNP" in the last 8760 hours.  CBG: Recent Labs  Lab 12/16/22 2035 12/17/22 0346 12/17/22 0620 12/17/22 1135 12/17/22 1558  GLUCAP 237* 219* 171* 177* 294*       Signed:  Ramiro Harvest MD.  [facility] 12/17/2022, 4:29 PM

## 2022-12-17 NOTE — Progress Notes (Signed)
Pt left the floor at 08:25 pm with his daughter named Jeanice Lim and a Licensed conveyancer accompany. Pt is alert, stable respiratory and hemodynamically. All of his tracheostomy care equipments and AVS  and all of his personal belongings were given to the Pt. Trach-care was provided by respiratory therapist prior discharge. Pt and family member had expressed understanding and had no questions at this time.   Filiberto Pinks, RN

## 2022-12-17 NOTE — TOC Transition Note (Signed)
Transition of Care (TOC) - CM/SW Discharge Note Donn Pierini RN, BSN Transitions of Care Unit 4E- RN Case Manager See Treatment Team for direct phone #   Patient Details  Name: [name] MRN: [id number] Date of Birth: 09/14/1950  Transition of Care [facility]) CM/SW Contact:  Darrold Span, RN Phone Number: 12/17/2022, 2:58 PM   Clinical Narrative:    ENT came last night and changed out Trach to cuffless, attending MD has placed trach supply order in epic.  CM has reached out to [place] at [employer] for [employer]- per [place] she is having trouble with her Epic access, CM will see if MD will also sign paper order form to fax in.   1200- Per Sharion Settler does not follow Trach pts for RT needs. Per PT and SLP follow up this am- pt would benefit from [facility] as well- CM spoke with pt at bedside to reassure that CM is working on getting Janina Mayo supplies to the home as well as needed DME for him to safely go home later today. Choice offered for [facility] needs Per CMS guidelines from PhoneFinancing.pl website with star ratings (copy placed in shadow chart)- pt states he does not have a preference. CM will work to find needed services.     1245-Call made to Amy w/ [facility] to see if they have staffing for PT/SLP and coordinate for RT needs. - awaiting return call.  1505- received msg back from [place] liaison- they are unable to service for [facility] needs at this time.  Calls made to additional [facility] agencies- [facility]- unable to service for SLP at this time [facility]- do not accept [employer]- do not accept new Trachs [facility]- do not accept new Trachs  CM unable to find accepting [facility] agency for PT/SLP- spoke with pt to see if he was interested in outpt- pt voiced he would go to outpt- closest [facility] location is [place] which pt voiced is about from home- agreeable to outpt referral for PT/SLP- MD provided verbal order for outpt referral-  Referral has been sent for PT/SLP needs to [facility]  [place] location.    1240- Paper Trach order form signed by MD and faxed to [facility].   1430- spoke with Bonita Quin at [facility]- trach supplies have been ordered and shipped to pt- delivery ETA 2-3 days- pt will need to be sent home with supplies from hospital to last until he gets his supplies for home. [facility] has reached out to pts SOBritta Mccreedy and spoken with her for delivery of suction and humidification to the home today- awaiting ETA for delivery so pt can transition safely home.  Per pt his daughter is coming from [facility] tonight (will be here around 7pm) and will stay overnight.   CM has also placed call to Dr. Pollyann Kennedy office to schedule follow up- awaiting return call.  1600- return call received - Dr. Pollyann Kennedy office- Rinaldo Cloud- voiced Dr. Pollyann Kennedy [facility] with [facility]- asked if referrals have been made as no appointments seen in epic- Rinaldo Cloud unsure and will f/u with Dr. Pollyann KennedyRinaldo Cloud to call CM back with appointment info.   [id number]- spoke with pt bedside- [employer] has contacted him to let him know they are on the way to home for delivery- TOC supervisor [address] [employer]- still working with [employer] to confirm RT will follow up with pt in home. CM spoke with daughter Jeanice Lim by TC at bedside- d/c arrangements reviewed as well as need to call ENT office  to find out about follow up appointment. Daughter to be here around 7 and if pt does not have transport home prior to that will come to hospital to transport. Daughter and pt aware that pt will not have HH services in the home, and that Janina Mayo supplies will be given to take home to last until pt gets shipment.   1700- noted MD also ordered nebulizer for home- will contact [employer] to also f/u for delivery of nebulizer to home.    Final next level of care: Home w Home Health Services Barriers to Discharge: Barriers Resolved   Patient Goals and CMS Choice CMS Medicare.gov Compare Post Acute Care list provided to:: Patient Choice offered to / list  presented to : Patient  Discharge Placement                 Home         Discharge Plan and Services Additional resources added to the After Visit Summary for     Discharge Planning Services: CM Consult Post Acute Care Choice: Durable Medical Equipment          DME Arranged: Suction DME Agency: [facility] Date DME Agency Contacted: 12/16/22 Time DME Agency Contacted: 1445 Representative spoke with at DME Agency: Christiane Ha Arranged: PT, Speech Therapy, Respirator Therapy   Date HH Agency Contacted: 12/17/22 Time HH Agency Contacted: 1300    Social Determinants of Health (SDOH) Interventions SDOH Screenings   Food Insecurity: No Food Insecurity (12/09/2022)  Housing: Low Risk  (12/09/2022)  Transportation Needs: No Transportation Needs (12/09/2022)  Utilities: Not At Risk (12/09/2022)  Tobacco Use: High Risk (12/11/2022)     Readmission Risk Interventions    12/17/2022    2:58 PM  Readmission Risk Prevention Plan  Transportation Screening Complete  Home Care Screening Complete  Medication Review (RN CM) Complete

## 2022-12-17 NOTE — Progress Notes (Signed)
Visit made to patients room to educate patient on trach care and maintenance. Demonstrated to patient using hand held mirror how to change inner cannula and importance of changing at least once a day and more often if needed.  Patient showed great effort in changing inner cannula.  Patient also instructed on how to suction himself.  Patient will need more education on this by home health RT when his portable at home suction is set up.  I instructed patient on importance of wearing the aerosol trach collar and to definitely make it a priority at bedtime.  Advised patient to not sleep in the PMV speaking valve.  Patient should not be discharged home until he has all necessary trach supplies and suction set up available.

## 2022-12-17 NOTE — Evaluation (Signed)
Passy-Muir Speaking Valve - Evaluation Patient Details  Name: [name] MRN: [id number] Date of Birth: 19-Jul-1951  Today's Date: 12/17/2022 Time: 0909-0930 SLP Time Calculation (min) (ACUTE ONLY): 21 min  Past Medical History:  Past Medical History:  Diagnosis Date   Cigarette smoker    Coronary artery disease    Diabetes mellitus    type 1  x 50 yrs   Diabetic retinopathy    Dyslipidemia    Hypertension    Peripheral vascular disease    Past Surgical History:  Past Surgical History:  Procedure Laterality Date   ABDOMINAL ANGIOGRAM N/A 05/29/2014   Procedure: ABDOMINAL ANGIOGRAM;  Surgeon: Pamella Pert, MD;  Location: [facility] CATH LAB;  Service: Cardiovascular;  Laterality: N/A;   ABDOMINAL AORTOGRAM W/LOWER EXTREMITY N/A 11/14/2019   Procedure: ABDOMINAL AORTOGRAM W/LOWER EXTREMITY;  Surgeon: Nada Libman, MD;  [facility]: [facility] INVASIVE CV LAB;  Service: Cardiovascular;  Laterality: N/A;   AMPUTATION Left 10/25/2015   Procedure: Left Foot 5th Ray Amputation;  Surgeon: Nadara Mustard, MD;  Location: [facility] OR;  Service: Orthopedics;  Laterality: Left;   AMPUTATION Left 12/06/2015   Procedure: AMPUTATION BELOW KNEE;  Surgeon: Nadara Mustard, MD;  [facility]: [facility] OR;  Service: Orthopedics;  Laterality: Left;   CARDIAC CATHETERIZATION     EF is 55-60% and no wall motion abnormalities (long long time ago)   DIRECT LARYNGOSCOPY N/A 12/10/2022   Procedure: DIRECT LARYNGOSCOPY WITH BIOPSY;  Surgeon: Serena Colonel, MD;  Location: [facility] OR;  Service: ENT;  Laterality: N/A;   FEMORAL-POPLITEAL BYPASS GRAFT Left 10/24/2015   Procedure: BYPASS GRAFT FEMORAL below knee POPLITEAL ARTERY with Left Saphenous Vein;  Surgeon: Nada Libman, MD;  [facility]: [facility] OR;  Service: Vascular;  Laterality: Left;   FEMORAL-POPLITEAL BYPASS GRAFT Right 11/15/2019   Procedure: BYPASS GRAFT FEMORAL-POPLITEAL ARTERY using Right Leg Greater Saphenous Vein;  Surgeon: Nada Libman, MD;  [facility]: [facility] OR;  Service: Vascular;   Laterality: Right;   FRACTURE SURGERY     left arm "many yrs ago"   LOWER EXTREMITY ANGIOGRAM N/A 01/23/2014   Procedure: LOWER EXTREMITY ANGIOGRAM;  Surgeon: Pamella Pert, MD;  Location: [facility] CATH LAB;  Service: Cardiovascular;  Laterality: N/A;   LOWER EXTREMITY ANGIOGRAM N/A 07/31/2014   Procedure: LOWER EXTREMITY ANGIOGRAM;  Surgeon: Pamella Pert, MD;  Location: [facility] CATH LAB;  Service: Cardiovascular;  Laterality: N/A;   LOWER EXTREMITY ANGIOGRAM Left 10/11/2015   Procedure: Lower Extremity Angiogram;  Surgeon: Nada Libman, MD;  Location: [facility] INVASIVE CV LAB;  Service: Cardiovascular;  Laterality: Left;   PERIPHERAL VASCULAR BALLOON ANGIOPLASTY  11/14/2019   Procedure: PERIPHERAL VASCULAR BALLOON ANGIOPLASTY;  Surgeon: Nada Libman, MD;  [facility] INV[facility];  Service: Cardiovascular;;   PERIPHERAL VASCULAR CATHETERIZATION Left 10/11/2015   Procedure: Peripheral Vascular Balloon Angioplasty;  Surgeon: Nada Libman, MD;  [facility] INVASIVE CV LAB;  Service: Cardiovascular;  Laterality: Left;  sfa failed unable to cross occluded sfa   PERIPHERAL VASCULAR CATHETERIZATION N/A 10/11/2015   Procedure: Abdominal Aortogram;  Surgeon: Nada Libman, MD;  [facility] INVASIVE CV LAB;  Service: Cardiovascular;  Laterality: N/A;   STUMP REVISION Left 03/02/2016   Procedure: Revision Left Below Knee Amputation;  Surgeon: Nadara Mustard, MD;  [facility] OR;  Service: Orthopedics;  Laterality: Left;   TRACHEOSTOMY TUBE PLACEMENT N/A 12/10/2022   Procedure: TRACHEOSTOMY UNDER LOCAL;  Surgeon: Serena Colonel, MD;  Location: [facility] OR;  Service: ENT;  Laterality: N/A;  HPI:  72 year old male presents to ED 4/17 after several days of gradually worsening SoB. 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 Developed stridor 4/18 and moved to ICU, tracheostomy and biopsy of laryngeal mass.  VWU:[id number] 1 diabetes mellitus, recent COPD diagnosis, coronary artery disease, hyperlipidemia, hypertension and L BKA     Assessment / Plan / Recommendation  Clinical Impression  Pt seen for PMV eval with uncuffed trach  Valve donned and pt able to blow air through upper airway indicative of patency. No indications of back pressure when doffed and valve remained in place until he coughed it off x 1. His respiratory support is adequate in conversation without SOB or any increase in work of breathing. He stated his vocal quality was hoarse prior to hospitalization given location of mass. Using valve he is able to achieve mild phonation that has both a breathy and hoarse qualityand is 100% intelligible. Heart rate 67 and Sp02 was not in place at time of eval- no increase work of breathing. SLP provided verbal and visual instructions with a mirror for donning and doffing valve and pt able to demonstrate after practicing over course of assessment with cues. Pt instucted to wear valve during all waking hours, especially with meals and SLP attached a leash to valve and trach ties. Case manager states pt does not qualify for home health ST due to not having PT/OT or nursing needs but will be followed up in trach clinic. SLP answered pt's questions pertaining to valve. SLP Visit Diagnosis: Aphonia (R49.1)    SLP Assessment  Patient needs continued Speech Lanaguage Pathology Services    Recommendations for follow up therapy are one component of a multi-disciplinary discharge planning process, led by the attending physician.  Recommendations may be updated based on patient status, additional functional criteria and insurance authorization.  Follow Up Recommendations   (Trach clinic)    Assistance Recommended at Discharge Intermittent Supervision/Assistance  Functional Status Assessment    Frequency and Duration min 1 x/week  1 week    PMSV Trial PMSV was placed for: 30 min Able to redirect subglottic air through upper airway: Yes Able to Attain Phonation: Yes Voice Quality: Breathy;Hoarse Able to Expectorate  Secretions: No attempts Breath Support for Phonation: Adequate Intelligibility: Intelligible Respirations During Trial:  (no increased work of breathing) SpO2 During Trial:  (monitor not on) Pulse During Trial: 67 Behavior: Alert;Controlled;Cooperative   Tracheostomy Tube       Vent Dependency  FiO2 (%): 21 %    Cuff Deflation Trial Tolerated Cuff Deflation:  (uncuffed trach) Behavior: Alert;Controlled;Cooperative         [name] 12/17/2022, 10:12 AM

## 2022-12-22 ENCOUNTER — Other Ambulatory Visit (HOSPITAL_COMMUNITY): Payer: Self-pay | Admitting: Otolaryngology

## 2022-12-22 ENCOUNTER — Other Ambulatory Visit (INDEPENDENT_AMBULATORY_CARE_PROVIDER_SITE_OTHER): Payer: Medicare Other | Admitting: Family

## 2022-12-22 DIAGNOSIS — C329 Malignant neoplasm of larynx, unspecified: Secondary | ICD-10-CM

## 2022-12-22 DIAGNOSIS — M25562 Pain in left knee: Secondary | ICD-10-CM

## 2022-12-22 NOTE — Progress Notes (Signed)
I connected with  [name] on 12/22/22 by a video enabled telemedicine application and verified that I am speaking with the correct person using two identifiers.   I discussed the limitations of evaluation and management by telemedicine. The patient expressed understanding and agreed to proceed.  .    Patient: [name]           Date of Birth: 09-19-1950           MRN: [id number] Visit Date: 12/22/2022              Requested by: No referring provider defined for this encounter. PCP: [name], MD  No chief complaint on file.     HPI: The patient is a 72 year old gentleman who is seen today for evaluation of his left residual limb.  He is status post left below-knee amputation.  Status post recent tracheostomy placement.  Concern for broken down worn out and ill fitting sleeves these are causing skin tears and irritation to his residual limb.  He has areas which are "raw".  Denies any erythema warmth or drainage.  Discomfort when wearing his prosthesis due to the worn-out liners  Assessment & Plan: Visit Diagnoses: No diagnosis found.  Plan: Given an order for new prosthesis supplies to [facility].  Discussed return precautions.  Follow-Up Instructions: No follow-ups on file.   Ortho Exam  Patient is alert, oriented, no adenopathy, well-dressed, normal affect, normal respiratory effort. Tracheostomy present.  On examination of the left residual limb the limb is well consolidated the incision well-healed.  There is dermatitis and superficial skin breakdown from his liner.  There is no callus buildup no erythema or drainage  Imaging: No results found. No images are attached to the encounter.  Labs: Lab Results  Component Value Date   HGBA1C 8.0 (H) 12/09/2022   HGBA1C 8.0 (H) 11/14/2019   HGBA1C 9.1 (H) 10/24/2015   REPTSTATUS 12/14/2022 FINAL 12/12/2022   GRAMSTAIN  12/12/2022    RARE WBC PRESENT, PREDOMINANTLY PMN MODERATE GRAM NEGATIVE RODS FEW GRAM  POSITIVE COCCI    CULT  12/12/2022    MODERATE Normal respiratory flora-no Staph aureus or Pseudomonas seen Performed at [facility], [address], [place], [place] [address]    LABORGA ENTEROBACTER CLOACAE 04/09/2020   LABORGA STAPHYLOCOCCUS AUREUS 04/09/2020     Lab Results  Component Value Date   ALBUMIN 2.2 (L) 12/14/2022   ALBUMIN 2.5 (L) 12/13/2022   ALBUMIN 3.1 (L) 12/10/2022    No results found for: "MG" No results found for: "VD25OH"  No results found for: "PREALBUMIN"    Latest Ref Rng & Units 12/17/2022    1:33 AM 12/14/2022    1:33 AM 12/13/2022   12:43 PM  CBC EXTENDED  WBC 4.0 - 10.5 K/uL 7.3  7.9  11.2   RBC 4.22 - 5.81 MIL/uL 2.89  3.50  3.69   Hemoglobin 13.0 - 17.0 g/dL 9.6  24.4  01.0   HCT 27.2 - 52.0 % 26.6  32.6  35.0   Platelets 150 - 400 K/uL 305  275  295   NEUT# 1.7 - 7.7 K/uL 5.0  5.8  9.0   Lymph# 0.7 - 4.0 K/uL 1.3  0.9  0.9      There is no height or weight on file to calculate BMI.  Orders:  No orders of the defined types were placed in this encounter.  No orders of the defined types were placed in this encounter.  Procedures: No procedures performed  Clinical Data: No additional findings.  ROS:  All other systems negative, except as noted in the HPI. Review of Systems  Objective: Vital Signs: There were no vitals taken for this visit.  Specialty Comments:  No specialty comments available.  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 12/09/2022   Adrenal adenoma, left 12/09/2022   Essential hypertension 12/09/2022   Dyspnea 11/27/2022   Elevated troponin 11/27/2022   Cough 10/21/2017   Wheezing  09/21/2017   Below-knee amputation of left lower extremity (HCC) 12/06/2015   Diabetic retinopathy associated with type 1 diabetes mellitus (HCC) 12/27/2014   Pleurodynia 11/23/2014   Diabetes mellitus with peripheral artery disease and hyperglycemia 07/30/2014   Claudication in peripheral vascular disease (HCC) 07/30/2014   Male erectile disorder 08/10/2013   PVD (peripheral vascular disease) s/p R fem-pop bypass and L BKA 04/15/2011   Hyperlipidemia 04/15/2011   Polyneuropathy 10/11/2009   Carotid artery occlusion 06/27/2009   Tobacco user 06/18/2009   Past Medical History:  Diagnosis Date   Cigarette smoker    Coronary artery disease    Diabetes mellitus    type 1  x 50 yrs   Diabetic retinopathy (HCC)    Dyslipidemia    Hypertension    Peripheral vascular disease (HCC)     Family History  Problem Relation Age of Onset   Coronary artery disease Mother    Other Father        Declined after a fall    Past Surgical History:  Procedure Laterality Date   ABDOMINAL ANGIOGRAM N/A 05/29/2014   Procedure: ABDOMINAL ANGIOGRAM;  Surgeon: Pamella Pert, MD;  Location: [facility] CATH LAB;  Service: Cardiovascular;  Laterality: N/A;   ABDOMINAL AORTOGRAM W/LOWER EXTREMITY N/A 11/14/2019   Procedure: ABDOMINAL AORTOGRAM W/LOWER EXTREMITY;  Surgeon: Nada Libman, MD;  [facility] INVASIVE CV LAB;  Service: Cardiovascular;  Laterality: N/A;   AMPUTATION Left 10/25/2015   Procedure: Left Foot 5th Ray Amputation;  Surgeon: Nadara Mustard, MD;  Location: [facility] OR;  Service: Orthopedics;  Laterality: Left;   AMPUTATION Left 12/06/2015   Procedure: AMPUTATION BELOW KNEE;  Surgeon: Nadara Mustard, MD;  Location: [facility] OR;  Service: Orthopedics;  Laterality: Left;   CARDIAC CATHETERIZATION     EF is 55-60% and no wall motion abnormalities (long long time ago)   DIRECT LARYNGOSCOPY N/A 12/10/2022   Procedure: DIRECT LARYNGOSCOPY WITH BIOPSY;  Surgeon: Serena Colonel, MD;  Location: [facility] OR;  Service: ENT;   Laterality: N/A;   FEMORAL-POPLITEAL BYPASS GRAFT Left 10/24/2015   Procedure: BYPASS GRAFT FEMORAL below knee POPLITEAL ARTERY with Left Saphenous Vein;  Surgeon: Nada Libman, MD;  Location: [facility] OR;  Service: Vascular;  Laterality: Left;   FEMORAL-POPLITEAL BYPASS GRAFT Right 11/15/2019   Procedure: BYPASS GRAFT FEMORAL-POPLITEAL ARTERY using Right Leg Greater Saphenous Vein;  Surgeon: Nada Libman, MD;  Location: [facility] OR;  Service: Vascular;  Laterality: Right;   FRACTURE SURGERY     left arm "many yrs ago"   LOWER EXTREMITY ANGIOGRAM N/A 01/23/2014   Procedure: LOWER EXTREMITY ANGIOGRAM;  Surgeon: Pamella Pert, MD;  Location[facility] C[facility] LAB;  Service: Cardiovascular;  Laterality: N/A;   LOWER EXTREMITY ANGIOGRAM N/A  07/31/2014   Procedure: LOWER EXTREMITY ANGIOGRAM;  Surgeon: Pamella Pert, MD;  Location: [facility] CATH LAB;  Service: Cardiovascular;  Laterality: N/A;   LOWER EXTREMITY ANGIOGRAM Left 10/11/2015   Procedure: Lower Extremity Angiogram;  Surgeon: Nada Libman, MD;  Location: [facility] INVASIVE CV LAB;  Service: Cardiovascular;  Laterality: Left;   PERIPHERAL VASCULAR BALLOON ANGIOPLASTY  11/14/2019   Procedure: PERIPHERAL VASCULAR BALLOON ANGIOPLASTY;  Surgeon: Nada Libman, MD;  [facility] INVASIVE CV LAB;  Service: Cardiovascular;;   PERIPHERAL VASCULAR CATHETERIZATION Left 10/11/2015   Procedure: Peripheral Vascular Balloon Angioplasty;  Surgeon: Nada Libman, MD;  [facility] INVASIVE CV LAB;  Service: Cardiovascular;  Laterality: Left;  sfa failed unable to cross occluded sfa   PERIPHERAL VASCULAR CATHETERIZATION N/A 10/11/2015   Procedure: Abdominal Aortogram;  Surgeon: Nada Libman, MD;  [facility] INVASIVE CV LAB;  Service: Cardiovascular;  Laterality: N/A;   STUMP REVISION Left 03/02/2016   Procedure: Revision Left Below Knee Amputation;  Surgeon: Nadara Mustard, MD;  Location: [facility] OR;  Service: Orthopedics;  Laterality: Left;   TRACHEOSTOMY TUBE PLACEMENT N/A  12/10/2022   Procedure: TRACHEOSTOMY UNDER LOCAL;  Surgeon: Serena Colonel, MD;  Location: [facility] OR;  Service: ENT;  Laterality: N/A;   Social History   Occupational History   Not on file  Tobacco Use   Smoking status: Every Day    Packs/day: 0.50    Years: 30.00    Additional pack years: 0.00    Total pack years: 15.00    Types: Cigarettes   Smokeless tobacco: Never  Vaping Use   Vaping Use: Never used  Substance and Sexual Activity   Alcohol use: Yes    Comment: on occasion wine   Drug use: No   Sexual activity: Not on file

## 2022-12-23 ENCOUNTER — Other Ambulatory Visit: Payer: Self-pay

## 2022-12-23 ENCOUNTER — Encounter: Payer: Self-pay | Admitting: Radiation Oncology

## 2022-12-23 DIAGNOSIS — C320 Malignant neoplasm of glottis: Secondary | ICD-10-CM

## 2022-12-23 NOTE — Progress Notes (Signed)
Oncology Nurse Navigator Documentation   Placed introductory call to new referral patient [name]. Introduced myself as the H&N oncology nurse navigator that works with Dr. Basilio Cairo and Dr. Al Pimple to whom he has been referred by Dr. Pollyann Kennedy. He confirmed understanding of referral. Briefly explained my role as his navigator, provided my contact information. His PET scan is scheduled at [facility] on 5/17. He was agreeable to move it to [facility] on 5/9 in order to expedite his treatment.  I explained that he would receive calls from Medical and Radiation oncology schedulers to get appointments with each after his PET scan has been completed.   I encouraged him to call with questions/concerns as he moves forward with appts and procedures.   He verbalized understanding of information provided, expressed appreciation for my call.   Hedda Slade RN, BSN, OCN Head & Neck Oncology Nurse Navigator [facility] Phone # [phone]  Fax # [phone]

## 2022-12-23 NOTE — Progress Notes (Signed)
Head and Neck Cancer Location of Tumor / Histology: Laryngeal Cancer   Pt has PET scan scheduled for 12-31-22  12-10-22 CT SOFT TISSUE NECK W CONTRAST  IMPRESSION: 1. Thickening of the bilateral vocal cords, right-greater-than-left, with medialization of the right vocal fold and an exophytic nodule arising from the right vocal fold measuring 5 x 9 x 7 mm. 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 2. No evidence of cervical lymphadenopathy. 3. Emphysema.  Patient presented with symptoms of: (Per Dr. Jearld Fenton note on 12-09-22) With a history of hoarseness for about 1 month. He has had a little bit of shortness of breath a month ago and up until 2 weeks ago. About 2 weeks ago he started having more feeling like there was some resistance to breathing. He did note some noise that was occurring with his breathing. He has not had any change in his voice. He was seen in the emergency room once and given breathing treatments and thought he had a COPD exacerbation. He is now back again with the same symptoms. He was stridorous at the [facility] emergency room. He has been transferred to [facility] for evaluation. He does have smoking history   Biopsies revealed:  12-10-22 FINAL MICROSCOPIC DIAGNOSIS:   A. LARYNGEAL MASS, BIOPSY:  - Squamous cell carcinoma, keratinizing  - See comment   COMMENT:  The specimen consists of multiple fragments that are poorly oriented and  thus accurate assessment of invasion is difficult.  There is at least  superficial invasion present on the sections examined.  Clinical  correlation recommended.  Dr. Kenyon Ana has reviewed this case and agrees  with the diagnosis.    Nutrition Status Yes No Comments  Weight changes? [x]  []  Gained weight per report  Swallowing concerns? []  [x]  Pt states there is a gap between his air way and esophagus where things can get stuck, pt has very hoarse and raspy voice by telephone, most likely from having trach  PEG? []  [x]     Referrals  Yes No Comments  Social Work? [x]  []    Dentistry? [x]  []  One tooth needs root canal  Swallowing therapy? [x]  []    Nutrition? [x]  []    Med/Onc? [x]  []     Safety Issues Yes No Comments  Prior radiation? []  [x]    Pacemaker/ICD? []  [x]    Possible current pregnancy? []  [x]  na  Is the patient on methotrexate? []  [x]     Tobacco/Marijuana/Snuff/ETOH use: everyday smoker  Past/Anticipated interventions by otolaryngology, if any:  Dr. Jearld Fenton on 12-09-22 Physical Exam HENT:     Head: Normocephalic.     Comments: Fiberoptic exam reveals he has a exophytic mass of the right vocal cord that appears to be extending through the entire cord but the exam is difficult with the disposable scope and his discomfort.  There is also a polyp at the anterior commissure that is slightly exophytic probably tumorous and then a more benign looking polyp of the left vocal cord that ball-valve's into the glottis.  This does explain his stridorous activity.  Difficult to say because there is some limitation due to the anterior commissure mass effect but it does look like both vocal cords move.  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  He does need a direct laryngoscopy and biopsy.  He also needs a CT scan with contrast.  If that could be ordered that would be helpful  in expediting his workup.  I am rotating off the service in the morning and will pass this along to the next otolaryngologist that will be taking over for a treatment plan.  12-10-22 PRE-OPERATIVE DIAGNOSIS:  Laryngeal Mass, airway obstruction   POST-OPERATIVE DIAGNOSIS:  Laryngeal Mass, airway obstruction   PROCEDURE:  Procedure(s): TRACHEOSTOMY under local anesthesia Direct laryngoscopy with biopsy of laryngeal mass under general anesthesia   SURGEON:  Surgeon(s): Susy Frizzle, MD  Dr. Pollyann Kennedy on 12-14-22  [facility] note) Patient seen on rounds.  Unable to change the tracheostomy to a noncuffed tube because the nursing staff was unable to obtain 1.   We discussed the pathology results.  This is a squamous cell cancer.  He is going to require PET scan evaluation, and referral to radiation and medical oncology.  Depending on the staging, treatment options were discussed briefly.  These include primary radiotherapy, chemo and radiation therapy, or total laryngectomy.        Electronically signed by Serena Colonel, MD at 12/14/2022 12:48 PM  Dr. Ernestene Kiel on 12-16-22 TRACHEOSTOMY TUBE EXCHANGE NOTE   ID: [name] with hx of larynx cancer, respiratory distress,  POD#6  s/p tracheostomy   PROCEDURE: Tracheostomy tube exchange   Findings: Shiley 6-0 cuffed tube replaced with shiley 6- cuffless tube. Stoma patent.   Past/Anticipated interventions by medical oncology, if any: Pt to see Dr. Al Pimple on 01-11-23  Current Complaints / other details:  None at this time.

## 2022-12-28 ENCOUNTER — Ambulatory Visit: Payer: Medicare Other | Admitting: Podiatry

## 2022-12-28 ENCOUNTER — Encounter: Payer: Self-pay | Admitting: Podiatry

## 2022-12-28 DIAGNOSIS — E1159 Type 2 diabetes mellitus with other circulatory complications: Secondary | ICD-10-CM

## 2022-12-28 DIAGNOSIS — B351 Tinea unguium: Secondary | ICD-10-CM | POA: Diagnosis not present

## 2022-12-28 DIAGNOSIS — Z794 Long term (current) use of insulin: Secondary | ICD-10-CM | POA: Diagnosis not present

## 2022-12-28 DIAGNOSIS — M79675 Pain in left toe(s): Secondary | ICD-10-CM

## 2022-12-28 DIAGNOSIS — M79674 Pain in right toe(s): Secondary | ICD-10-CM

## 2022-12-28 NOTE — Progress Notes (Signed)
  Subjective:  Patient ID: [name], male    DOB: [birth year],   MRN: [id number]  Chief Complaint  Patient presents with   Nail Problem     Routine foot care     72 y.o. male presents for concern of thickened elongated and painful nails that are difficult to trim. Requesting to have them trimmed today. Relates burning and tingling in their feet. Patient is diabetic and last A1c was  Lab Results  Component Value Date   HGBA1C 8.0 (H) 12/09/2022   .   PCP:  [name], MD    . Denies any other pedal complaints. Denies n/v/f/c.   Past Medical History:  Diagnosis Date   Cigarette smoker    Coronary artery disease    Diabetes mellitus    type 1  x 50 yrs   Diabetic retinopathy (HCC)    Dyslipidemia    Hypertension    Peripheral vascular disease (HCC)     Objective:  Physical Exam: Vascular: DP/PT pulses 2/4 bilateral. CFT <3 seconds. Absent hair growth on digits. Edema noted to bilateral lower extremities. Xerosis noted bilaterally.  Skin. No lacerations or abrasions bilateral feet. Nails 1-5 right  are thickened discolored and elongated with subungual debris.  Musculoskeletal: MMT 5/5 bilateral lower extremities in DF, PF, Inversion and Eversion. Deceased ROM in DF of ankle joint. 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  Neurological: Sensation intact to light touch. Protective sensation diminished bilateral.    Assessment:   1. Pain due to onychomycosis of toenails of both feet   2. Controlled type 2 diabetes mellitus with other circulatory complication, with long-term current use of insulin (HCC)       Plan:  Patient was evaluated and treated and all questions answered. -Discussed and educated patient on diabetic foot care, especially with  regards to the vascular, neurological and musculoskeletal systems.  -Stressed the importance of good glycemic control and the detriment of not  controlling glucose levels in relation to the foot. -Discussed supportive shoes  at all times and checking feet regularly.  -Mechanically debrided all nails 1-5 right using sterile nail nipper and filed with dremel without incident  -Answered all patient questions -Patient to return  in 3 months for at risk foot care -Patient advised to call the office if any problems or questions arise in the meantime.   Louann Sjogren, DPM

## 2022-12-31 ENCOUNTER — Encounter (HOSPITAL_COMMUNITY)
Admission: RE | Admit: 2022-12-31 | Discharge: 2022-12-31 | Disposition: A | Discharge status: Home / Self Care | Payer: Medicare Other | Source: Ambulatory Visit | Attending: Otolaryngology | Admitting: Otolaryngology

## 2022-12-31 DIAGNOSIS — C329 Malignant neoplasm of larynx, unspecified: Secondary | ICD-10-CM | POA: Insufficient documentation

## 2022-12-31 MED ORDER — FLUDEOXYGLUCOSE F - 18 (FDG) INJECTION
6.6400 | Freq: Once | INTRAVENOUS | Status: AC | PRN
  Administered 2022-12-31: 6.64 via INTRAVENOUS

## 2023-01-04 ENCOUNTER — Ambulatory Visit: Payer: Medicare Other | Attending: Internal Medicine | Admitting: Speech Pathology

## 2023-01-04 ENCOUNTER — Telehealth: Payer: Self-pay

## 2023-01-04 ENCOUNTER — Encounter: Payer: Self-pay | Admitting: Radiation Oncology

## 2023-01-04 DIAGNOSIS — R498 Other voice and resonance disorders: Secondary | ICD-10-CM | POA: Diagnosis present

## 2023-01-04 DIAGNOSIS — R1313 Dysphagia, pharyngeal phase: Secondary | ICD-10-CM | POA: Insufficient documentation

## 2023-01-04 DIAGNOSIS — R471 Dysarthria and anarthria: Secondary | ICD-10-CM | POA: Diagnosis not present

## 2023-01-04 NOTE — Therapy (Unsigned)
OUTPATIENT SPEECH LANGUAGE PATHOLOGY EVALUATION   Patient Name: [name] MRN: [id number] DOB:22-Aug-1951, 72 y.o., male Today's Date: 01/05/2023  PCP: [name], MD REFERRING PROVIDER: Rodolph Bong., MD  END OF SESSION:  End of Session - 01/05/23 0818     Visit Number 1    Number of Visits 9    Date for SLP Re-Evaluation 03/30/23    Authorization Type [employer]    Progress Note Due on Visit 10    SLP Start Time 1400    SLP Stop Time  1445    SLP Time Calculation (min) 45 min    Activity Tolerance Patient tolerated treatment well             Past Medical History:  Diagnosis Date   Cigarette smoker    Coronary artery disease    Diabetes mellitus    type 1  x 50 yrs   Diabetic retinopathy (HCC)    Dyslipidemia    Hypertension    Peripheral vascular disease (HCC)    Past Surgical History:  Procedure Laterality Date   ABDOMINAL ANGIOGRAM N/A 05/29/2014   Procedure: ABDOMINAL ANGIOGRAM;  Surgeon: Pamella Pert, MD;  Location: [facility] CATH LAB;  Service: Cardiovascular;  Laterality: N/A;   ABDOMINAL AORTOGRAM W/LOWER EXTREMITY N/A 11/14/2019   Procedure: ABDOMINAL AORTOGRAM W/LOWER EXTREMITY;  Surgeon: Nada Libman, MD;  Location: [facility] INVASIVE CV LAB;  Service: Cardiovascular;  Laterality: N/A;   AMPUTATION Left 10/25/2015   Procedure: Left Foot 5th Ray Amputation;  Surgeon: Nadara Mustard, MD;  Location: [facility] OR;  Service: Orthopedics;  Laterality: Left;   AMPUTATION Left 12/06/2015   Procedure: AMPUTATION BELOW KNEE;  Surgeon: Nadara Mustard, MD;  Location: [facility] OR;  Service: Orthopedics;  Laterality: Left;   CARDIAC CATHETERIZATION     EF is 55-60% and no wall motion abnormalities (long long time ago)   DIRECT LARYNGOSCOPY N/A 12/10/2022   Procedure: DIRECT LARYNGOSCOPY WITH BIOPSY;  Surgeon: Serena Colonel, MD;  Location: [facility] OR;  Service: ENT;  Laterality: N/A;   FEMORAL-POPLITEAL BYPASS GRAFT Left 10/24/2015   Procedure: BYPASS GRAFT FEMORAL below knee POPLITEAL ARTERY  with Left Saphenous Vein;  Surgeon: Nada Libman, MD;  Location: [facility] OR;  Service: Vascular;  Laterality: Left;   FEMORAL-POPLITEAL BYPASS GRAFT Right 11/15/2019   Procedure: BYPASS GRAFT FEMORAL-POPLITEAL ARTERY using Right Leg Greater Saphenous Vein;  Surgeon: Nada Libman, MD;  Location: [facility] OR;  Service: Vascular;  Laterality: Right;   FRACTURE SURGERY     left arm "many yrs ago"   LOWER EXTREMITY ANGIOGRAM N/A 01/23/2014   Procedure: LOWER EXTREMITY ANGIOGRAM;  Surgeon: Pamella Pert, MD;  Location: [facility] CATH LAB;  Service: Cardiovascular;  Laterality: N/A;   LOWER EXTREMITY ANGIOGRAM N/A 07/31/2014   Procedure: LOWER EXTREMITY ANGIOGRAM;  Surgeon: Pamella Pert, MD;  Location: [facility] CATH LAB;  Service: Cardiovascular;  Laterality: N/A;   LOWER EXTREMITY ANGIOGRAM Left 10/11/2015   Procedure: Lower Extremity Angiogram;  Surgeon: Nada Libman, MD;  Location: [facility] INVASIVE CV LAB;  Service: Cardiovascular;  Laterality: Left;   PERIPHERAL VASCULAR BALLOON ANGIOPLASTY  11/14/2019   Procedure: PERIPHERAL VASCULAR BALLOON ANGIOPLASTY;  Surgeon: Nada Libman, MD;  Location: [facility] INVASIVE CV LAB;  Service: Cardiovascular;;   PERIPHERAL VASCULAR CATHETERIZATION Left 10/11/2015   Procedure: Peripheral Vascular Balloon Angioplasty;  Surgeon: Nada Libman, MD;  Location: [facility] INVASIVE CV LAB;  Service: Cardiovascular;  Laterality: Left;  sfa failed unable to cross occluded sfa  PERIPHERAL VASCULAR CATHETERIZATION N/A 10/11/2015   Procedure: Abdominal Aortogram;  Surgeon: Nada Libman, MD;  Location: [facility] INVASIVE CV LAB;  Service: Cardiovascular;  Laterality: N/A;   STUMP REVISION Left 03/02/2016   Procedure: Revision Left Below Knee Amputation;  Surgeon: Nadara Mustard, MD;  Location: [facility] OR;  Service: Orthopedics;  Laterality: Left;   TRACHEOSTOMY TUBE PLACEMENT N/A 12/10/2022   Procedure: TRACHEOSTOMY UNDER LOCAL;  Surgeon: Serena Colonel, MD;  Location: [facility] OR;  Service: ENT;  Laterality: N/A;    Patient Active Problem List   Diagnosis Date Noted   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   Protein-calorie malnutrition, severe (HCC) 12/15/2022   Postprocedural pneumothorax 12/11/2022   Acute respiratory failure with hypoxia (HCC) 12/11/2022   Tracheostomy status (HCC) 12/11/2022   Pressure injury of skin 12/11/2022   Hyponatremia 12/10/2022   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 12/09/2022   Adrenal adenoma, left 12/09/2022   Essential hypertension 12/09/2022   Dyspnea 11/27/2022   Elevated troponin 11/27/2022   Cough 10/21/2017   Wheezing 09/21/2017   Below-knee amputation of left lower extremity (HCC) 12/06/2015   Diabetic retinopathy associated with type 1 diabetes mellitus (HCC) 12/27/2014   Pleurodynia 11/23/2014   Diabetes mellitus with peripheral artery disease and hyperglycemia 07/30/2014   Claudication in peripheral vascular disease (HCC) 07/30/2014   Male erectile disorder 08/10/2013   PVD (peripheral vascular disease) s/p R fem-pop bypass and L BKA 04/15/2011   Hyperlipidemia 04/15/2011   Polyneuropathy 10/11/2009   Carotid artery occlusion 06/27/2009   Tobacco user 06/18/2009    ONSET DATE: 11/27/22   REFERRING DIAG: Z93.0 (ICD-10-CM) - Tracheostomy status   THERAPY DIAG:  Other voice and resonance disorders  Dysarthria and anarthria  Rationale for Evaluation and Treatment: Rehabilitation  SUBJECTIVE:   SUBJECTIVE STATEMENT: "I need to be doing stuff"  Pt accompanied by: friend, Britta Mccreedy  PERTINENT HISTORY: 72 year old male presents to ED 4/17 after several days of gradually worsening SoB. 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 Developed stridor 4/18 and moved to ICU, tracheostomy and biopsy of laryngeal mass. UUV:[id number] 1 diabetes mellitus, recent COPD diagnosis, coronary artery disease, hyperlipidemia, hypertension and L BKA. Dyspnea ~1 month, 30 lbs weight loss over past 6 months. D/c home 12/17/2022  PAIN:  Are you having pain? No  FALLS: Has patient fallen in last  6 months?  No  LIVING ENVIRONMENT: Lives with: lives with their partner Lives in: House/apartment  PLOF:  Level of assistance: Independent with ADLs, Independent with IADLs Employment: Retired  PATIENT GOALS: "get back to doing what I want to do"   OBJECTIVE:   DIAGNOSTIC FINDINGS: CT SOFT TISSUE NECK W CONTRAST  IMPRESSION: 1. Thickening of the bilateral vocal cords, right-greater-than-left, with medialization of the right vocal fold and an exophytic nodule arising from the right vocal fold measuring 5 x 9 x 7 mm. 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 2. No evidence of cervical lymphadenopathy. 3. Emphysema.  RECOMMENDATIONS FROM OBJECTIVE SWALLOW STUDY (MBSS/FEES):  12/15/22  Clinical Impression: Clinical Impression: Pt's MBS completed without a PMV due to trach not yet changed to uncuffed prior to [facility] evaluation per ENT orders. He demonstrated mild oropharyngeal dysphagia marked by decreased oral transit with pt compensating by  thrusting/extending head to assist in oral transit and occasional minimal lingual residue. Pt noted to have reduced laryngeal elevation and epiglottic deflection resulting in penetration with thin barium before full closure could occur. Most penetration instances with thin were initially ejected from vestibule during the swallow however barium on upper 1/3 of  epiglottis began to penetrate midway into vestiuble without awareness after the swallow. Pt then penetrated to vocal cords with thin. Gloved finger occlusion with cough helped mobilize penetrate to upper epiglottis. Chin tuck resulted in silent aspiration and right head turn was ineffective. Nectar thick penetrated to the cords and finger occlusion with cough moved barium higher in vestibule. Mastication and transit of solid was functional. Recommend pt continue regular texture, thin liquid with gloved finger occlusion and strong cough after every 2-3 sips liquid, pills whole in puree. Pt is being  worked up for laryngeal mass and treatment plan is unknown to this SLP. Recommend follow up with home health ST for continuation of strategies and assist during treatment process.  COGNITION: Overall cognitive status: Within functional limits for tasks assessed  ORAL MOTOR EXAMINATION: Overall status: WFL Comments: with exception of voice production 2/2 laryngeal mass and trach  CLINICAL SWALLOW ASSESSMENT:   Current diet: regular and thin liquids Dentition: adequate natural dentition Patient directly observed with POs: Yes: thin liquids  Feeding: able to feed self Liquids provided by: cup Oral phase signs and symptoms:  none Pharyngeal phase signs and symptoms: delayed cough Comments: MBSS completed 12/15/22, demonstrating WNL oral phase, impaired laryngeal elevation and epiglottic inversion resulting in inadequate airway protection.   VOICE ASSESSMENT: Overall voice: Impaired, pt with PMV Voice quality: low vocal intensity Respiratory function: thoracic breathing Maximum phonation time for sustained "ah": 8 seconds Conversational loudness average: 67 dB Comments:Pt demonstrating adequate voicing with PMV. Fully intelligible without over difficulties relaying thoughts and ideas, even in complex, extended samples.   STANDARDIZED ASSESSMENTS: Deferred d/t nature of impairments   PATIENT REPORTED OUTCOME MEASURES (PROM): Deferred for time   TODAY'S TREATMENT:                                                                                                                                         01/04/23: Extensive education provided regarding ST treatment indicated for multiple avenues of care in presence of laryngeal cancer. Pt reporting he has upcoming doctors appointments and is still considering his options. Presents with many questions (e.g. recovery time from laryngectomy, QoL concerns), which SLP answers with caveat that questions are being answered to best of ability but would  best be addressed with oncologist, to which pt verbalizes understanding. Education provided on likely acute and late stage dysphagia if pt received HNC radiation, and best practice being implementation of prophylactic swallowing exercises, to which pt verbalized understanding. Pt verbalizes appreciation for all education provided.   Reviewed swallow strategy recommendations and aspiration precautions with pt, he was able to teach back cough + swallow as strategy to clear airway. Recommendations given to assist in eating of solids as pt reports sometimes having trouble swallow. Recommend small bites, full mastication, and liquid wash. Handout provided.   Direct instruction on home trach care provided, with pt able to teach back recommendations. Has  been wearing PMV valve all day while awake and removing at night. SLP showed pt how to detach PMV from trach collar to enable washing. Education on airway protection, to which pt verbalizes understanding.   PATIENT EDUCATION: Education details: see above Person educated: Patient and friend, Britta Mccreedy Education method: Explanation, Demonstration, and Handouts Education comprehension: verbalized understanding, returned demonstration, and needs further education   GOALS: Goals reviewed with patient? Yes  SHORT TERM GOALS: Target date: third total session   Pt will compelte HEP with modified independence in 2 sessions Baseline: Goal status: INITIAL  2.  pt will tell SLP why pt is completing HEP with modified independence Baseline:  Goal status: INITIAL  3.  pt will describe 3 overt s/s aspiration PNA with modified independence Baseline:  Goal status: INITIAL  4.  pt will tell SLP how a food journal could hasten return to a more normalized diet Baseline:  Goal status: INITIAL   LONG TERM GOALS: Target date: seventh total session  pt will complete HEP with independence over two visits  Baseline:  Goal status: INITIAL  2.  pt will describe  how to modify HEP over time, and the timeline associated with reduction in HEP frequency with modified independence over two sessions  Baseline:  Goal status: INITIAL   ASSESSMENT:  CLINICAL IMPRESSION: Patient is a 72 y.o. M who was seen today for ST evaluation for dysphagia and voice in presence of trach with PMV. Pt has been wearing PMV all day without shortness of breath, is able to don and doff IND. Education provided regarding trach and PMV care at home, to which pt verbalized understanding. Pt noted to have adequate voicing with valve, suitable for conversations. Ongoing dysphagia, 2/2 laryngeal mass. Pt able to teach back swallow strategies and recommendations, declines ongoing therapy sessions to address implementation at this time. Pt reportedly unsure if will pursue chemoradiation vs laryngectomee. SLP provided education on SLP interventions will vary based on cancer treatment course.   Therapeutic interventions will vary based on pt's decision regarding chemo+ radiation vs laryngectomy. If pt desired chemoradiation, SLP recommends initiation of dysphagia HEP to optimize ongoing swallow function and potentially reduce severity of dysphagia. Should pt undergo total laryngectomy, POC will be updated to accommodate goals to address laryngeal communication.   OBJECTIVE IMPAIRMENTS: include voice disorder and dysphagia. These impairments are limiting patient from effectively communicating at home and in community and safety when swallowing. Factors affecting potential to achieve goals and functional outcome are co-morbidities. Patient will benefit from skilled SLP services to address above impairments and improve overall function.  REHAB POTENTIAL: Good  PLAN:  SLP FREQUENCY: 1-2x/week  SLP DURATION: 8 weeks  PLANNED INTERVENTIONS: Aspiration precaution training, Pharyngeal strengthening exercises, Diet toleration management , Internal/external aids, Oral motor exercises, Multimodal  communication approach, SLP instruction and feedback, Compensatory strategies, Patient/family education, and Re-evaluation    Maia Breslow, CCC-SLP 01/05/2023, 12:32 PM

## 2023-01-04 NOTE — Progress Notes (Signed)
Radiation Oncology         [phone] ________________________________  Initial Outpatient Consultation  Name: [name] MRN: [id number]  Date: 01/05/2023  DOB: 27-Jan-1951  WU:[id number], Jonny Ruiz, MD  Serena Colonel, MD   REFERRING PHYSICIAN: Serena Colonel, MD  DIAGNOSIS: No diagnosis found.  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 male who initially presented to the [facility] ED on 11/27/22 with c/o dyspnea and wheezing which began suddenly that night. Upon arrival of EMS, he was given supplemental O2 with significant improvement in his respiratory status. He denied any further symptoms in the hospital. CTA of the chest performed in the ED showed evidence of emphysema and COPD and no evidence of PE (study was overall limited due to motion artifact). Overall, his presentation and imaging findings were suggestive of COPD exacerbation and he was given solu-medorl. He was also discharged home with a short course of prednisone and an albuterol inhaler for as needed use.   His symptoms unfortunately progressed which prompted the patient to return to the ED on 12/09/22. On arrival, the patient endorsed sudden worsening dyspnea, a mostly nonproductive cough, fatigue, and wheezing without improvement with albuterol. Physical exam performed in the ED was notable for stridor on auscultation. He was subsequently admitted per ENT for laryngoscopy to evaluate for upper airway obstruction. Laryngoscopy performed on 04/17 revealed a exophytic mass of the right vocal cord with what appeared to be extension through the entire cord. A polyp at the anterior commissure was also noted that was slightly exophytic and likely tumorous, as well as a more benign looking polyp of the left vocal cord that ball-valved into the glottis, which was thought to  correlate with his stridorous activity.    Soft tissue neck CT with contrast on 12/10/22 demonstrated thickening of the bilateral vocal cords (right-greater-than-left), medialization of the right vocal fold, and an exophytic nodule arising from the right vocal fold measuring 5 x 9 x 7 mm worrisome for malignancy. CT otherwise showed no evidence of cervical lymphadenopathy.   Accordingly, the patient underwent biopsies of the laryngeal mass and tracheostomy on 12/10/22. Biopsy of the laryngeal mass revealed findings consistent with squamous cell carcinoma; keratinizing. He was again seen by ENT on 12/16/2022 for tracheostomy tube exchanged (replaced with a Shiley 6 cuffed flex tube). He was discharged the following date (12/17/22) with tracheostomy supplies and home health.   In most recent history, the patient met with Dr. Pollyann Kennedy [facility] ENT) on 01/01/23 to discuss treatment options. Options reviewed were chemo/XRT or total laryngectomy with possible postop radiation.   Pertinent imaging thus far includes a PET scan performed on 12/31/22 which demonstrated: hypermetabolism in the laryngeal mass consistent with neoplasm (SUV max of 8.37), and emphysematous changes with pulmonary scarring. PET otherwise showed no evidence of cervical lymphadenopathy or evidence of metastatic disease involving the chest, abdomen/pelvis, or bony structures.   He is currently scheduled to meet with Dr. Al Pimple in consultation on 01/11/23.   Swallowing issues, if any: S/p tracheostomy  -- Barium swallow study performed while inpatient on 12/15/22 demonstrated mild oropharyngeal dysphagia  Weight Changes: an approximately 30 pound weight loss over the last 6 months or so  Pain status: ***  Other symptoms: Presented with worsening dyspnea, a mostly nonproductive cough, fatigue, and wheezing without improvement with albuterol. The patient is also a type  1 diabetic (diagnosed in his early 20's) with diabetic retinopathy.    Tobacco history, if any: current smoking with a 15 pack year smoking history  ETOH abuse, if any: drinks on occasion   Prior cancers, if any: none  PREVIOUS RADIATION THERAPY: No  PAST MEDICAL HISTORY:  has a past medical history of Cigarette smoker, Coronary artery disease, Diabetes mellitus, Diabetic retinopathy (HCC), Dyslipidemia, Hypertension, and Peripheral vascular disease (HCC).    PAST SURGICAL HISTORY: Past Surgical History:  Procedure Laterality Date   ABDOMINAL ANGIOGRAM N/A 05/29/2014   Procedure: ABDOMINAL ANGIOGRAM;  Surgeon: Pamella Pert, MD;  Location: [facility] CATH LAB;  Service: Cardiovascular;  Laterality: N/A;   ABDOMINAL AORTOGRAM W/LOWER EXTREMITY N/A 11/14/2019   Procedure: ABDOMINAL AORTOGRAM W/LOWER EXTREMITY;  Surgeon: Nada Libman, MD;  Location: [facility] INVASIVE CV LAB;  Service: Cardiovascular;  Laterality: N/A;   AMPUTATION Left 10/25/2015   Procedure: Left Foot 5th Ray Amputation;  Surgeon: Nadara Mustard, MD;  Location: [facility] OR;  Service: Orthopedics;  Laterality: Left;   AMPUTATION Left 12/06/2015   Procedure: AMPUTATION BELOW KNEE;  Surgeon: Nadara Mustard, MD;  Location: [facility] OR;  Service: Orthopedics;  Laterality: Left;   CARDIAC CATHETERIZATION     EF is 55-60% and no wall motion abnormalities (long long time ago)   DIRECT LARYNGOSCOPY N/A 12/10/2022   Procedure: DIRECT LARYNGOSCOPY WITH BIOPSY;  Surgeon: Serena Colonel, MD;  Location: [facility] OR;  Service: ENT;  Laterality: N/A;   FEMORAL-POPLITEAL BYPASS GRAFT Left 10/24/2015   Procedure: BYPASS GRAFT FEMORAL below knee POPLITEAL ARTERY with Left Saphenous Vein;  Surgeon: Nada Libman, MD;  Location: [facility] OR;  Service: Vascular;  Laterality: Left;   FEMORAL-POPLITEAL BYPASS GRAFT Right 11/15/2019   Procedure: BYPASS GRAFT FEMORAL-POPLITEAL ARTERY using Right Leg Greater Saphenous Vein;  Surgeon: Nada Libman, MD;  Location: [facility] OR;  Service: Vascular;  Laterality: Right;   FRACTURE SURGERY     left arm "many yrs  ago"   LOWER EXTREMITY ANGIOGRAM N/A 01/23/2014   Procedure: LOWER EXTREMITY ANGIOGRAM;  Surgeon: Pamella Pert, MD;  Location: [facility] CATH LAB;  Service: Cardiovascular;  Laterality: N/A;   LOWER EXTREMITY ANGIOGRAM N/A 07/31/2014   Procedure: LOWER EXTREMITY ANGIOGRAM;  Surgeon: Pamella Pert, MD;  Location: [facility] CATH LAB;  Service: Cardiovascular;  Laterality: N/A;   LOWER EXTREMITY ANGIOGRAM Left 10/11/2015   Procedure: Lower Extremity Angiogram;  Surgeon: Nada Libman, MD;  Location: [facility] INVASIVE CV LAB;  Service: Cardiovascular;  Laterality: Left;   PERIPHERAL VASCULAR BALLOON ANGIOPLASTY  11/14/2019   Procedure: PERIPHERAL VASCULAR BALLOON ANGIOPLASTY;  Surgeon: Nada Libman, MD;  Location: [facility] INVAS[facility] CV LAB;  Service: Cardiovascular;;   PERIPHERAL VASCULAR CATHETERIZATION Left 10/11/2015   Procedure: Peripheral Vascular Balloon Angioplasty;  Surgeon: Nada Libman, MD;  Location: [facility] INVASIVE CV LAB;  Service: Cardiovascular;  Laterality: Left;  sfa failed unable to cross occluded sfa   PERIPHERAL VASCULAR CATHETERIZATION N/A 10/11/2015   Procedure: Abdominal Aortogram;  Surgeon: Nada Libman, MD;  Location: [facility] INVASIVE CV LAB;  Service: Cardiovascular;  Laterality: N/A;   STUMP REVISION Left 03/02/2016   Procedure: Revision Left Below Knee Amputation;  Surgeon: Nadara Mustard, MD;  Location: [facility] OR;  Service: Orthopedics;  Laterality: Left;   TRACHEOSTOMY TUBE PLACEMENT N/A 12/10/2022   Procedure: TRACHEOSTOMY UNDER LOCAL;  Surgeon: Serena Colonel, MD;  Location: [facility] OR;  Service: ENT;  Laterality: N/A;    FAMILY HISTORY: family history includes Coronary artery disease in  his mother; Other in his father.  SOCIAL HISTORY:  reports that he has been smoking cigarettes. He has a 15.00 pack-year smoking history. He has never used smokeless tobacco. He reports current alcohol use. He reports that he does not use drugs.  ALLERGIES: Simvastatin  MEDICATIONS:  Current Outpatient Medications   Medication Sig Dispense Refill   acetaminophen (TYLENOL) 325 MG tablet Take 2 tablets (650 mg total) by mouth every 6 (six) hours as needed for mild pain (or Fever >/= 101).     albuterol (VENTOLIN HFA) 108 (90 Base) MCG/ACT inhaler Inhale 1-2 puffs into the lungs every 6 (six) hours as needed for wheezing or shortness of breath. (Patient taking differently: Inhale 2 puffs into the lungs every 6 (six) hours as needed for wheezing or shortness of breath.) 1 each 1   aspirin EC 81 MG tablet Take 325 mg by mouth at bedtime.     baclofen (LIORESAL) 20 MG tablet Take 20 mg by mouth daily as needed (for leg cramps). Reported on 12/05/2015  0   calcium carbonate (TUMS - DOSED IN MG ELEMENTAL CALCIUM) 500 MG chewable tablet Chew 1,000-2,000 mg by mouth as needed for indigestion or heartburn.     folic acid (FOLVITE) 800 MCG tablet Take 1,600 mcg by mouth at bedtime.     HUMALOG 100 UNIT/ML injection 2.1 mLs See admin instructions. Via PUMP Inject 2.1 ml into the pump when it runs out. Pt is unsure how often he fills his pump or the rate of his doses.     ibuprofen (ADVIL) 200 MG tablet Take 400 mg by mouth as needed for moderate pain.     ipratropium-albuterol (DUONEB) 0.5-2.5 (3) MG/3ML SOLN Take 3 mLs by nebulization 2 (two) times daily. 360 mL 1   metoprolol succinate (TOPROL-XL) 25 MG 24 hr tablet Take 1 tablet (25 mg total) by mouth daily. (Patient taking differently: Take 25 mg by mouth at bedtime.) 30 tablet 1   mometasone-formoterol (DULERA) 200-5 MCG/ACT AERO Inhale 2 puffs into the lungs 2 (two) times daily. 13 g 1   Multiple Vitamins-Minerals (MULTIVITAMIN GUMMIES ADULT PO) Take 3 tablets by mouth at bedtime.     ramipril (ALTACE) 5 MG capsule Take 5 mg by mouth at bedtime.     rosuvastatin (CRESTOR) 10 MG tablet TAKE 1 TABLET BY MOUTH  DAILY (Patient taking differently: Take 10 mg by mouth at bedtime.) 90 tablet 3   No current facility-administered medications for this encounter.    REVIEW  OF SYSTEMS:  Notable for that above.   PHYSICAL EXAM:  vitals were not taken for this visit.   General: Alert and oriented, in no acute distress HEENT: Head is normocephalic. Extraocular movements are intact. Oropharynx is notable for ***. Neck: Neck is notable for *** Heart: Regular in rate and rhythm with no murmurs, rubs, or gallops. Chest: Clear to auscultation bilaterally, with no rhonchi, wheezes, or rales. Abdomen: Soft, nontender, nondistended, with no rigidity or guarding. Extremities: No cyanosis or edema. Lymphatics: see Neck Exam Skin: No concerning lesions. Musculoskeletal: symmetric strength and muscle tone throughout. Neurologic: Cranial nerves II through XII are grossly intact. No obvious focalities. Speech is fluent. Coordination is intact. Psychiatric: Judgment and insight are intact. Affect is appropriate.   ECOG = ***  0 - Asymptomatic (Fully active, able to carry on all predisease activities without restriction)  1 - Symptomatic but completely ambulatory (Restricted in physically strenuous activity but ambulatory and able to carry out work of a light or  sedentary nature. For example, light housework, office work)  2 - Symptomatic, <50% in bed during the day (Ambulatory and capable of all self care but unable to carry out any work activities. Up and about more than 50% of waking hours)  3 - Symptomatic, >50% in bed, but not bedbound (Capable of only limited self-care, confined to bed or chair 50% or more of waking hours)  4 - Bedbound (Completely disabled. Cannot carry on any self-care. Totally confined to bed or chair)  5 - Death   Santiago Glad MM, Creech RH, Tormey DC, et al. [phone]). "Toxicity and response criteria of the [facility]". Am. Evlyn Clines. Oncol. 5 (6): 649-55   LABORATORY DATA:  Lab Results  Component Value Date   WBC 7.3 12/17/2022   HGB 9.6 (L) 12/17/2022   HCT 26.6 (L) 12/17/2022   MCV 92.0 12/17/2022   PLT 305 12/17/2022    CMP     Component Value Date/Time   NA 134 (L) 12/17/2022 0133   NA 141 12/17/2015 0000   K 4.2 12/17/2022 0133   CL 100 12/17/2022 0133   CO2 27 12/17/2022 0133   GLUCOSE 282 (H) 12/17/2022 0133   BUN 20 12/17/2022 0133   BUN 17 12/17/2015 0000   CREATININE 0.72 12/17/2022 0133   CALCIUM 8.1 (L) 12/17/2022 0133   PROT 5.8 (L) 12/10/2022 0246   ALBUMIN 2.2 (L) 12/14/2022 0133   AST 22 12/10/2022 0246   ALT 24 12/10/2022 0246   ALKPHOS 70 12/10/2022 0246   BILITOT 0.6 12/10/2022 0246   GFRNONAA >60 12/17/2022 0133   GFRAA >60 05/24/2020 1040      No results found for: "TSH"   RADIOGRAPHY: NM PET Image Initial (PI) Skull Base To Thigh (F-18 FDG)  Result Date: 01/04/2023 CLINICAL DATA:  Initial treatment strategy for laryngeal carcinoma. EXAM: NUCLEAR MEDICINE PET SKULL BASE TO THIGH TECHNIQUE: 6.64 mCi F-18 FDG was injected intravenously. Full-ring PET imaging was performed from the skull base to thigh after the radiotracer. CT data was obtained and used for attenuation correction and anatomic localization. Fasting blood glucose: 161 mg/dl COMPARISON:  Neck CT 11/91/4782 FINDINGS: Mediastinal blood pool activity: SUV max 1.26 Liver activity: SUV max NA NECK: The laryngeal mass is hypermetabolic with SUV max of 8.37. No associated enlarged or hypermetabolic cervical lymphadenopathy. Incidental CT findings: Bilateral carotid artery calcifications. Tracheostomy tube in good position without complicating features. CHEST: No hypermetabolic mediastinal or hilar nodes. No suspicious pulmonary nodules on the CT scan. No supraclavicular or axillary adenopathy. Incidental CT findings: Emphysematous changes and pulmonary scarring no acute pulmonary process or worrisome pulmonary lesions. Advanced atherosclerotic calcifications involving the aorta and coronary arteries. ABDOMEN/PELVIS: No abnormal hypermetabolic activity within the liver, pancreas, adrenal glands, or spleen. No hypermetabolic lymph  nodes in the abdomen or pelvis. Incidental CT findings: Advanced atherosclerotic calcifications involving the aorta and iliac arteries but no aneurysm. SKELETON: No focal hypermetabolic activity to suggest skeletal metastasis. Incidental CT findings: Advanced degenerative changes involving both shoulders and both hips. IMPRESSION: 1. Hypermetabolic laryngeal mass consistent with known neoplasm. 2. No enlarged or hypermetabolic cervical lymphadenopathy. 3. No findings for metastatic disease involving the chest, abdomen/pelvis or bony structures. 4. Emphysematous changes and pulmonary scarring. 5. Advanced vascular disease. Aortic Atherosclerosis (ICD10-I70.0). Electronically Signed   By: Rudie Meyer M.D.   On: 01/04/2023 09:14   DG Swallowing Func-Speech Pathology  Result Date: 12/15/2022 Table formatting from the original result was not included. Modified Barium Swallow Study Patient Details Name:  [name] MRN: [id number] Date of Birth: [birth year] Today's Date: 12/15/2022 HPI/PMH: HPI: 72 year old male presents to ED 4/17 after several days of gradually worsening SoB. 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 Developed stridor 4/18 and moved to ICU, tracheostomy and biopsy of laryngeal mass.  NWG:NFAO 1 diabetes mellitus, recent COPD diagnosis, coronary artery disease, hyperlipidemia, hypertension and L BKA Clinical Impression: Clinical Impression: Pt's MBS completed without a PMV due to trach not yet changed to uncuffed prior to [facility] evaluation per ENT orders. He demonstrated mild oropharyngeal dysphagia marked by decreased oral transit with pt compensating by  thrusting/extending head to assist in oral transit and occasional minimal lingual residue. Pt noted to have reduced laryngeal elevation and epiglottic deflection resulting in penetration with thin barium before full closure could occur. Most penetration instances with thin were initially ejected from vestibule during the swallow however barium on upper 1/3  of epiglottis began to penetrate midway into vestiuble without awareness after the swallow. Pt then penetrated to vocal cords with thin. Gloved finger occlusion with cough helped mobilize penetrate to upper epiglottis. Chin tuck resulted in silent aspiration and right head turn was ineffective. Nectar thick penetrated to the cords and finger occlusion with cough moved barium higher in vestibule. Mastication and transit of solid was functional. Recommend pt continue regular texture, thin liquid with gloved finger occlusion and strong cough after every 2-3 sips liquid, pills whole in puree. Pt is being worked up for laryngeal mass and treatment plan is unknown to this SLP. Recommend follow up with home health ST for continuation of strategies and assist during treatment process. Factors that may increase risk of adverse event in presence of aspiration Rubye Oaks & Clearance Coots 2021): Factors that may increase risk of adverse event in presence of aspiration Rubye Oaks & Clearance Coots 2021): Presence of tubes (ETT, trach, NG, etc.) Recommendations/Plan: Swallowing Evaluation Recommendations Swallowing Evaluation Recommendations Recommendations: PO diet PO Diet Recommendation: Regular; Thin liquids (Level 0) Liquid Administration via: Cup Medication Administration: Whole meds with puree Supervision: Patient able to self-feed Swallowing strategies  : Slow rate; Small bites/sips; Multiple dry swallows after each bite/sip (finger occlude trach and cough) Postural changes: Position pt fully upright for meals Oral care recommendations: Oral care BID (2x/day) Treatment Plan Treatment Plan Treatment recommendations: Therapy as outlined in treatment plan below Follow-up recommendations: Home health SLP Functional status assessment: Patient has had a recent decline in their functional status and demonstrates the ability to make significant improvements in function in a reasonable and predictable amount of time. Treatment frequency: Min 2x/week  Treatment duration: 2 weeks Interventions: Compensatory techniques; Patient/family education; Diet toleration management by SLP Recommendations Recommendations for follow up therapy are one component of a multi-disciplinary discharge planning process, led by the attending physician.  Recommendations may be updated based on patient status, additional functional criteria and insurance authorization. Assessment: Orofacial Exam: Orofacial Exam Oral Cavity: Oral Hygiene: WFL Oral Cavity - Dentition: Adequate natural dentition Orofacial Anatomy: WFL Oral Motor/Sensory Function: WFL Anatomy: Anatomy: Other (Comment) (possible fullness around vocal cords -difficult to view) Boluses Administered: Boluses Administered Boluses Administered: Thin liquids (Level 0); Mildly thick liquids (Level 2, nectar thick); Puree; Solid  Oral Impairment Domain: Oral Impairment Domain Lip Closure: No labial escape Tongue control during bolus hold: Cohesive bolus between tongue to palatal seal Bolus preparation/mastication: Timely and efficient chewing and mashing Bolus transport/lingual motion: Brisk tongue motion (extended head back to help transit bolus) Oral residue: Trace residue lining oral structures Location of oral residue : Tongue Initiation of  pharyngeal swallow : Pyriform sinuses; Valleculae  Pharyngeal Impairment Domain: Pharyngeal Impairment Domain Soft palate elevation: No bolus between soft palate (SP)/pharyngeal wall (PW) Laryngeal elevation: Partial superior movement of thyroid cartilage/partial approximation of arytenoids to epiglottic petiole Anterior hyoid excursion: Complete anterior movement Epiglottic movement: Partial inversion Laryngeal vestibule closure: Incomplete, narrow column air/contrast in laryngeal vestibule Pharyngeal stripping wave : Present - diminished Pharyngeal contraction (A/P view only): N/A Pharyngoesophageal segment opening: Complete distension and complete duration, no obstruction of flow Tongue  base retraction: Narrow column of contrast or air between tongue base and PPW Pharyngeal residue: Collection of residue within or on pharyngeal structures Location of pharyngeal residue: Valleculae; Pyriform sinuses; Aryepiglottic folds; Pharyngeal wall  Esophageal Impairment Domain: Esophageal Impairment Domain Esophageal clearance upright position: Complete clearance, esophageal coating Pill: Esophageal Impairment Domain Esophageal clearance upright position: Complete clearance, esophageal coating Penetration/Aspiration Scale Score: Penetration/Aspiration Scale Score 2.  Material enters airway, remains ABOVE vocal cords then ejected out: Thin liquids (Level 0); Solid; Puree; Mildly thick liquids (Level 2, nectar thick) 3.  Material enters airway, remains ABOVE vocal cords and not ejected out: Thin liquids (Level 0); Mildly thick liquids (Level 2, nectar thick) 8.  Material enters airway, passes BELOW cords without attempt by patient to eject out (silent aspiration) : Thin liquids (Level 0) (with a chin tuck) Compensatory Strategies: Compensatory Strategies Compensatory strategies: Yes Chin tuck: Ineffective Ineffective Chin Tuck: Thin liquid (Level 0) Right head turn: Ineffective Ineffective Right Head Turn: Thin liquid (Level 0)   General Information: Caregiver present: No  Diet Prior to this Study: Regular; Thin liquids (Level 0)   Temperature : Normal   Respiratory Status: WFL   Supplemental O2: Trach Collar   History of Recent Intubation: No  Behavior/Cognition: Alert; Cooperative Self-Feeding Abilities: Able to self-feed Baseline vocal quality/speech: -- (no pmv- no voicing with finger occlusion) Volitional Cough: -- (some air movement with finger occlusion) Volitional Swallow: Able to elicit No data recorded Goal Planning: Prognosis for improved oropharyngeal function: -- (fair-good) No data recorded No data recorded No data recorded Consulted and agree with results and recommendations: Patient Pain: Pain  Assessment Pain Assessment: No/denies pain Faces Pain Scale: 4 Pain Location: Throat Pain Descriptors / Indicators: Grimacing; Guarding; Discomfort Pain Intervention(s): Limited activity within patient's tolerance; Monitored during session End of Session: Start Time:SLP Start Time (ACUTE ONLY): 1314 Stop Time: SLP Stop Time (ACUTE ONLY): 1331 Time Calculation:SLP Time Calculation (min) (ACUTE ONLY): 17 min Charges: SLP Evaluations $ SLP Speech Visit: 1 Visit SLP Evaluations $BSS Swallow: 1 Procedure $MBS Swallow: 1 Procedure SLP visit diagnosis: SLP Visit Diagnosis: Dysphagia, oropharyngeal phase (R13.12) Past Medical History: Past Medical History: Diagnosis Date  Cigarette smoker   Coronary artery disease   Diabetes mellitus   type 1  x 50 yrs  Diabetic retinopathy   Dyslipidemia   Hypertension   Peripheral vascular disease  Past Surgical History: Past Surgical History: Procedure Laterality Date  ABDOMINAL ANGIOGRAM N/A 05/29/2014  Procedure: ABDOMINAL ANGIOGRAM;  Surgeon: Pamella Pert, MD;  Location: [facility] CATH LAB;  Service: Cardiovascular;  Laterality: N/A;  ABDOMINAL AORTOGRAM W/LOWER EXTREMITY N/A 11/14/2019  Procedure: ABDOMINAL AORTOGRAM W/LOWER EXTREMITY;  Surgeon: Nada Libman, MD;  Location: [facility] INVASIVE CV LAB;  Service: Cardiovascular;  Laterality: N/A;  AMPUTATION Left 10/25/2015  Procedure: Left Foot 5th Ray Amputation;  Surgeon: Nadara Mustard, MD;  Location: [facility] OR;  Service: Orthopedics;  Laterality: Left;  AMPUTATION Left 12/06/2015  Procedure: AMPUTATION BELOW KNEE;  Surgeon: Nadara Mustard, MD;  Location: [facility] OR;  Service: Orthopedics;  Laterality: Left;  CARDIAC CATHETERIZATION    EF is 55-60% and no wall motion abnormalities (long long time ago)  DIRECT LARYNGOSCOPY N/A 12/10/2022  Procedure: DIRECT LARYNGOSCOPY WITH BIOPSY;  Surgeon: Serena Colonel, MD;  Location: [facility] OR;  Service: ENT;  Laterality: N/A;  FEMORAL-POPLITEAL BYPASS GRAFT Left 10/24/2015  Procedure: BYPASS GRAFT FEMORAL below knee  POPLITEAL ARTERY with Left Saphenous Vein;  Surgeon: Nada Libman, MD;  Location: [facility] OR;  Service: Vascular;  Laterality: Left;  FEMORAL-POPLITEAL BYPASS GRAFT Right 11/15/2019  Procedure: BYPASS GRAFT FEMORAL-POPLITEAL ARTERY using Right Leg Greater Saphenous Vein;  Surgeon: Nada Libman, MD;  Location: [facility] OR;  Service: Vascular;  Laterality: Right;  FRACTURE SURGERY    left arm "many yrs ago"  LOWER EXTREMITY ANGIOGRAM N/A 01/23/2014  Procedure: LOWER EXTREMITY ANGIOGRAM;  Surgeon: Pamella Pert, MD;  Location: [facility] CATH LAB;  Service: Cardiovascular;  Laterality: N/A;  LOWER EXTREMITY ANGIOGRAM N/A 07/31/2014  Procedure: LOWER EXTREMITY ANGIOGRAM;  Surgeon: Pamella Pert, MD;  Location: [facility] CATH LAB;  Service: Cardiovascular;  Laterality: N/A;  LOWER EXTREMITY ANGIOGRAM Left 10/11/2015  Procedure: Lower Extremity Angiogram;  Surgeon: Nada Libman, MD;  Location: [facility] INVASIVE CV LAB;  Service: Cardiovascular;  Laterality: Left;  PERIPHERAL VASCULAR BALLOON ANGIOPLASTY  11/14/2019  Procedure: PERIPHERAL VASCULAR BALLOON ANGIOPLASTY;  Surgeon: Nada Libman, MD;  Location: [facility] INVASIVE CV LAB;  Service: Cardiovascular;;  PERIPHERAL VASCULAR CATHETERIZATION Left 10/11/2015  Procedure: Peripheral Vascular Balloon Angioplasty;  Surgeon: Nada Libman, MD;  Location: [facility] INVASIVE CV LAB;  Service: Cardiovascular;  Laterality: Left;  sfa failed unable to cross occluded sfa  PERIPHERAL VASCULAR CATHETERIZATION N/A 10/11/2015  Procedure: Abdominal Aortogram;  Surgeon: Nada Libman, MD;  Location: [facility] INVASIVE CV LAB;  Service: Cardiovascular;  Laterality: N/A;  STUMP REVISION Left 03/02/2016  Procedure: Revision Left Below Knee Amputation;  Surgeon: Nadara Mustard, MD;  Location: [facility] OR;  Service: Orthopedics;  Laterality: Left;  TRACHEOSTOMY TUBE PLACEMENT N/A 12/10/2022  Procedure: TRACHEOSTOMY UNDER LOCAL;  Surgeon: Serena Colonel, MD;  Location: [facility] OR;  Service: ENT;  Laterality: N/A; [name]  12/15/2022, 2:46 PM  DG Chest Port 1 View  Result Date: 12/15/2022 CLINICAL DATA:  Encounter for pneumothorax. EXAM: PORTABLE CHEST 1 VIEW COMPARISON:  12/13/2022 FINDINGS: Persistent lucency at the right lung apex is compatible with a small pneumothorax which is minimally changed. Again noted is subcutaneous air on the left side of the chest. There is decreased subcutaneous air in the lower neck regions. Patient has a tracheostomy tube. Heart size is stable. Patchy interstitial densities in the lower lungs, right side greater than left. IMPRESSION: 1. Stable small right apical pneumothorax. 2. Slightly decreased subcutaneous air. 3. Patchy interstitial densities in both lungs. Findings are nonspecific but could represent atelectasis or interstitial edema. Atypical infection cannot be excluded. Recommend continued follow-up. Electronically Signed   By: Richarda Overlie M.D.   On: 12/15/2022 09:43   DG Chest Port 1 View  Result Date: 12/13/2022 CLINICAL DATA:  Follow-up pneumothorax. EXAM: PORTABLE CHEST 1 VIEW COMPARISON:  Multiple recent chest x-rays. FINDINGS: The tracheostomy tube is stable. Small residual right apical pneumothorax, less than 5%. Persistent but improving subcutaneous emphysema and pneumomediastinum. Stable advanced emphysematous changes and pulmonary scarring. IMPRESSION: 1. Small residual right apical pneumothorax, less than 5%. 2. Persistent but improving subcutaneous emphysema and pneumomediastinum. Electronically Signed   By: Rudie Meyer M.D.   On: 12/13/2022 09:10   DG  CHEST PORT 1 VIEW  Result Date: 12/12/2022 CLINICAL DATA:  Follow-up pneumothorax EXAM: PORTABLE CHEST 1 VIEW COMPARISON:  12/11/2022 FINDINGS: Cardiac shadow is within normal limits. Tracheostomy tube is noted in satisfactory position. Lungs are well aerated bilaterally. Small right-sided pneumothorax is again noted and stable. Subcutaneous emphysema is seen. No focal confluent infiltrate is noted. IMPRESSION: Stable  right pneumothorax. Electronically Signed   By: Alcide Clever M.D.   On: 12/12/2022 09:04   DG CHEST PORT 1 VIEW  Result Date: 12/11/2022 CLINICAL DATA:  [id number] with pneumothorax and tracheostomy. Shortness of breath. Evaluate airspace disease and pleural effusions. EXAM: PORTABLE CHEST 1 VIEW COMPARISON:  Portable chest yesterday at 9:50 p.m. FINDINGS: 4:31 a.m. Tracheostomy cannula again terminates about 6.4 cm from the carina, unchanged. Left chest wall and bilateral supraclavicular soft tissue emphysema continue to be seen and show mild improvement. At least a small pneumomediastinum continues to be noted along the left heart border and may have improved. There is a small right apical pneumothorax estimated 5% of the chest volume and seems grossly unchanged, without mediastinal shift. There is no appreciable left pneumothorax. The lungs are generally clear. The cardiomediastinal silhouette is normal apart from the pneumomediastinum. IMPRESSION: 1. Small right apical pneumothorax, estimated 5% of the chest volume, seems grossly unchanged without mediastinal shift. 2. Left chest wall and bilateral supraclavicular soft tissue emphysema continue to be seen and show mild improvement. 3. Small pneumomediastinum may have improved. 4. No consolidation or effusion is seen. Electronically Signed   By: Almira Bar M.D.   On: 12/11/2022 06:31   DG CHEST PORT 1 VIEW  Addendum Date: 12/10/2022   ADDENDUM REPORT: 12/10/2022 22:57 ADDENDUM: Critical Value/emergent results were called by telephone at the time of interpretation on 12/10/2022 at 10:56 pm to provider ADITYA PALIWAL , who verbally acknowledged these results. Electronically Signed   By: Minerva Fester M.D.   On: 12/10/2022 22:57   Result Date: 12/10/2022 CLINICAL DATA:  Hypoxia EXAM: PORTABLE CHEST 1 VIEW COMPARISON:  Chest radiograph 12/09/2022 FINDINGS: New tracheostomy tube with tip in the mid intrathoracic trachea. There is new pneumomediastinum.  Cardiomediastinal silhouette is otherwise stable. Small right apical pneumothorax. No evidence of tension morphology. Extensive subcutaneous emphysema through the left chest wall and bilateral neck. No focal consolidation or pleural effusion. IMPRESSION: 1. New tracheostomy tube with tip in the mid intrathoracic trachea. 2. New small right apical pneumothorax. 3. New pneumomediastinum. 4. Extensive subcutaneous emphysema throughout the left chest wall and bilateral neck. Electronically Signed: By: Minerva Fester M.D. On: 12/10/2022 22:53   CT SOFT TISSUE NECK W CONTRAST  Result Date: 12/10/2022 CLINICAL DATA:  Vocal cord mass EXAM: CT NECK WITH CONTRAST TECHNIQUE: Multidetector CT imaging of the neck was performed using the standard protocol following the bolus administration of intravenous contrast. RADIATION DOSE REDUCTION: This exam was performed according to the departmental dose-optimization program which includes automated exposure control, adjustment of the mA and/or kV according to patient size and/or use of iterative reconstruction technique. CONTRAST:  75mL OMNIPAQUE IOHEXOL 350 MG/ML SOLN COMPARISON:  None Available. FINDINGS: Pharynx and larynx: There is thickening of the bilateral vocal cords, right-greater-than-left. There is medialization of the right vocal fold (series 3, image 74). There is an exophytic nodule arising from the right vocal fold (series 7, image 83/series 5, image 83) measuring 5 x 9 x 7 mm. The epiglottis is normal in appearance. No evidence of tonsillar hypertrophy. Salivary glands: No inflammation, mass, or stone. Thyroid: Normal. Lymph nodes: No  evidence of cervical lymphadenopathy. Vascular: Mild narrowing of the origin of bilateral ICAs Limited intracranial: Negative. Visualized orbits: Negative. Mastoids and visualized paranasal sinuses: Clear. Skeleton: Degenerative changes in the cervical spine without focal osseous lesions. Upper chest: Mild centrilobular emphysema.  Biapical pleural-parenchymal scarring. Other: None IMPRESSION: 1. Thickening of the bilateral vocal cords, right-greater-than-left, with medialization of the right vocal fold and an exophytic nodule arising from the right vocal fold measuring 5 x 9 x 7 mm. 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 2. No evidence of cervical lymphadenopathy. 3. Emphysema. Emphysema (ICD10-J43.9). Electronically Signed   By: Lorenza Cambridge M.D.   On: 12/10/2022 11:32   DG Chest Portable 1 View  Result Date: 12/09/2022 CLINICAL DATA:  72 year old male with history of shortness of breath. EXAM: PORTABLE CHEST 1 VIEW COMPARISON:  Chest x-ray 11/27/2022. FINDINGS: Lung volumes are normal. Emphysematous changes are noted. No consolidative airspace disease. No pleural effusions. No pneumothorax. No pulmonary nodule or mass noted. Pulmonary vasculature and the cardiomediastinal silhouette are within normal limits. Atherosclerosis in the thoracic aorta. IMPRESSION: 1. No radiographic evidence of acute cardiopulmonary disease. 2. Emphysema. 3. Aortic atherosclerosis. Electronically Signed   By: Trudie Reed M.D.   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 I *** recommend radiotherapy for this patient.  We discussed the potential risks, benefits, and side effects of radiotherapy. We talked in detail about acute and late effects. We discussed that some of the most bothersome acute effects may be mucositis, dysgeusia, salivary changes, skin irritation, hair loss, dehydration, weight loss and fatigue. We talked about late effects which include but are not necessarily limited to dysphagia, hypothyroidism, nerve injury, vascular injury, spinal cord injury, xerostomia, trismus, neck edema, and potential injury to any of the tissues in the head and neck region. No guarantees of treatment were given. A consent form was signed and placed in the patient's medical record. The  patient is enthusiastic about proceeding with treatment. I look forward to participating in the patient's care.    Simulation (treatment planning) will take place ***  We also discussed that the treatment of head and neck cancer is a multidisciplinary process to maximize treatment outcomes and quality of life. For this reason the following referrals have been or will be made:  *** Medical oncology to discuss chemotherapy   *** Dentistry for dental evaluation, possible extractions in the radiation fields, and /or advice on reducing risk of cavities, osteoradionecrosis, or other oral issues.  *** Nutritionist for nutrition support during and after treatment.  *** Speech language pathology for swallowing and/or speech therapy.  *** Social work for social support.   *** Physical therapy due to risk of lymphedema in neck and deconditioning.  *** Baseline labs including TSH.  On date of service, in total, I spent *** minutes on this encounter. Patient was seen in person.  __________________________________________   Lonie Peak, MD  This document serves as a record of services personally performed by Lonie Peak, MD. It was created on her behalf by Neena Rhymes, a trained medical scribe. The creation of this record is based on the scribe's personal observations and the provider's statements to them. This document has been checked and approved by the attending provider.

## 2023-01-04 NOTE — Telephone Encounter (Signed)
Rn called to obtain meaningful use and nurse evaluation information. Consult note complete and routed to Dr. Basilio Cairo and Quitman Livings PA. Pt was very hoarse with voice, limiting the conversation.

## 2023-01-05 ENCOUNTER — Ambulatory Visit
Admission: RE | Admit: 2023-01-05 | Discharge: 2023-01-05 | Disposition: A | Discharge status: Home / Self Care | Payer: Medicare Other | Source: Ambulatory Visit | Attending: Radiation Oncology | Admitting: Radiation Oncology

## 2023-01-05 ENCOUNTER — Other Ambulatory Visit: Payer: Self-pay

## 2023-01-05 ENCOUNTER — Encounter: Payer: Self-pay | Admitting: Radiation Oncology

## 2023-01-05 ENCOUNTER — Ambulatory Visit: Payer: Medicare Other | Admitting: Radiation Oncology

## 2023-01-05 VITALS — BP 140/49 | HR 59 | Temp 97.5°F | Resp 18 | Wt 135.0 lb

## 2023-01-05 DIAGNOSIS — E1051 Type 1 diabetes mellitus with diabetic peripheral angiopathy without gangrene: Secondary | ICD-10-CM | POA: Insufficient documentation

## 2023-01-05 DIAGNOSIS — Z794 Long term (current) use of insulin: Secondary | ICD-10-CM | POA: Diagnosis not present

## 2023-01-05 DIAGNOSIS — C320 Malignant neoplasm of glottis: Secondary | ICD-10-CM

## 2023-01-05 DIAGNOSIS — Z7982 Long term (current) use of aspirin: Secondary | ICD-10-CM | POA: Diagnosis not present

## 2023-01-05 DIAGNOSIS — I700 Atherosclerosis of aorta: Secondary | ICD-10-CM | POA: Insufficient documentation

## 2023-01-05 DIAGNOSIS — J432 Centrilobular emphysema: Secondary | ICD-10-CM | POA: Insufficient documentation

## 2023-01-05 DIAGNOSIS — Z79899 Other long term (current) drug therapy: Secondary | ICD-10-CM | POA: Insufficient documentation

## 2023-01-05 DIAGNOSIS — F17210 Nicotine dependence, cigarettes, uncomplicated: Secondary | ICD-10-CM | POA: Insufficient documentation

## 2023-01-05 DIAGNOSIS — R1312 Dysphagia, oropharyngeal phase: Secondary | ICD-10-CM | POA: Diagnosis not present

## 2023-01-05 DIAGNOSIS — Z7951 Long term (current) use of inhaled steroids: Secondary | ICD-10-CM | POA: Diagnosis not present

## 2023-01-05 NOTE — Progress Notes (Signed)
Oncology Nurse Navigator Documentation   Met with patient during initial consult with Mr. Castor. He was accompanied by his partner who lives with him.  Further introduced myself as his/their Navigator, explained my role as a member of the Care Team. Provided New Patient resource guide binder: Contact information for physicians, this navigator, other members of the Care Team Advance Directive information; provided [facility] AD booklet at their request,  Fall Prevention Patient Safety Plan Financial Assistance Information sheet Symptom Management Clinic information [facility] campus map with highlight of [facility] SLP Information sheet Head and Neck cancer basics Nutrition information Patient and family support information including Spiritual care/Chaplain information, Peer mentor program, health and wellness classes, and the survivorship program Community resources  Provided and discussed educational handouts for PEG and PAC. Assisted with post-consult appt scheduling. I called and left a voice mail with Dr. Lucky Rathke assistant asking for an appointment with Dr. Pollyann Kennedy to be scheduled as soon as possible to discuss surgical options. He would like to consider surgery versus chemo/radiation due to the side effects. He will still meet with Dr. Al Pimple on Monday 5/20.  They verbalized understanding of information provided. I encouraged them to call with questions/concerns moving forward.  Hedda Slade, RN, BSN, OCN Head & Neck Oncology Nurse Navigator [facility] [place] [phone]

## 2023-01-06 ENCOUNTER — Other Ambulatory Visit: Payer: Medicare Other

## 2023-01-08 ENCOUNTER — Ambulatory Visit: Payer: Medicare Other | Admitting: Radiation Oncology

## 2023-01-08 ENCOUNTER — Ambulatory Visit: Payer: Medicare Other

## 2023-01-08 ENCOUNTER — Ambulatory Visit (HOSPITAL_COMMUNITY): Payer: Medicare Other

## 2023-01-11 ENCOUNTER — Inpatient Hospital Stay: Payer: Medicare Other | Attending: Hematology and Oncology | Admitting: Hematology and Oncology

## 2023-01-11 ENCOUNTER — Inpatient Hospital Stay: Payer: Medicare Other

## 2023-01-11 ENCOUNTER — Encounter: Payer: Self-pay | Admitting: Adult Health

## 2023-01-11 ENCOUNTER — Ambulatory Visit: Payer: Medicare Other | Admitting: Adult Health

## 2023-01-11 ENCOUNTER — Other Ambulatory Visit: Payer: Self-pay

## 2023-01-11 VITALS — BP 146/44 | HR 69 | Temp 97.7°F | Resp 16 | Wt 135.2 lb

## 2023-01-11 VITALS — BP 130/50 | HR 76 | Ht 71.0 in | Wt 136.6 lb

## 2023-01-11 DIAGNOSIS — Z87891 Personal history of nicotine dependence: Secondary | ICD-10-CM | POA: Insufficient documentation

## 2023-01-11 DIAGNOSIS — C329 Malignant neoplasm of larynx, unspecified: Secondary | ICD-10-CM | POA: Diagnosis present

## 2023-01-11 DIAGNOSIS — E119 Type 2 diabetes mellitus without complications: Secondary | ICD-10-CM | POA: Diagnosis not present

## 2023-01-11 DIAGNOSIS — J449 Chronic obstructive pulmonary disease, unspecified: Secondary | ICD-10-CM | POA: Insufficient documentation

## 2023-01-11 DIAGNOSIS — Z9641 Presence of insulin pump (external) (internal): Secondary | ICD-10-CM | POA: Insufficient documentation

## 2023-01-11 DIAGNOSIS — Z930 Tracheostomy status: Secondary | ICD-10-CM | POA: Diagnosis not present

## 2023-01-11 DIAGNOSIS — Z8249 Family history of ischemic heart disease and other diseases of the circulatory system: Secondary | ICD-10-CM | POA: Diagnosis not present

## 2023-01-11 DIAGNOSIS — Z79899 Other long term (current) drug therapy: Secondary | ICD-10-CM | POA: Diagnosis not present

## 2023-01-11 DIAGNOSIS — I252 Old myocardial infarction: Secondary | ICD-10-CM | POA: Diagnosis not present

## 2023-01-11 DIAGNOSIS — C320 Malignant neoplasm of glottis: Secondary | ICD-10-CM

## 2023-01-11 DIAGNOSIS — Z794 Long term (current) use of insulin: Secondary | ICD-10-CM | POA: Insufficient documentation

## 2023-01-11 DIAGNOSIS — Z89512 Acquired absence of left leg below knee: Secondary | ICD-10-CM | POA: Diagnosis not present

## 2023-01-11 DIAGNOSIS — J9601 Acute respiratory failure with hypoxia: Secondary | ICD-10-CM | POA: Diagnosis not present

## 2023-01-11 DIAGNOSIS — J439 Emphysema, unspecified: Secondary | ICD-10-CM

## 2023-01-11 DIAGNOSIS — Z7982 Long term (current) use of aspirin: Secondary | ICD-10-CM | POA: Insufficient documentation

## 2023-01-11 DIAGNOSIS — E43 Unspecified severe protein-calorie malnutrition: Secondary | ICD-10-CM | POA: Diagnosis not present

## 2023-01-11 MED ORDER — ALBUTEROL SULFATE HFA 108 (90 BASE) MCG/ACT IN AERS: 1.0000 | INHALATION_SPRAY | Freq: Four times a day (QID) | RESPIRATORY_TRACT | 2 refills | Status: DC | PRN

## 2023-01-11 NOTE — Progress Notes (Signed)
@Patient  ID: [name], male    DOB: 07-28-1951, 72 y.o.   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  Hospitalization complicated by right apical pneumothorax resolved without intervention Medical his significant for type 1 diabetes on insulin, peripheral vascular disease status post left BKA and coronary artery disease   TEST/EVENTS :  PET scan Dec 31, 2022 hypermetabolic laryngeal mass, no enlarged or hypermetabolic adenopathy, emphysematous changes   01/11/2023 Follow up: COPD, Laryngeal cancer s/p Trach  Patient present for posthospital follow-up.  Patient presented to the hospital December 09, 2022 with severe shortness of breath found to have a new laryngeal mass.  Patient had acute respiratory distress and required emergent tracheostomy on April 18.  Underwent trach change on December 16, 2022 with Shiley 6.0 cuffed tube replaced with a Shiley 6.0 cuffless tube. 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  PET scan on Dec 31, 2022 showed hypermetabolic laryngeal mass with no enlarged or hypermetabolic adenopathy. Seen recently by oncology and radiation oncology and is considering surgical resection and may need additional chemo or radiation depending on final pathology findings.  Is being followed by ENT with recommendations for possible chemoradiation or total laryngectomy with possible postop radiation.  Patient is leaning towards total laryngectomy.  Currently says his tracheostomy is doing okay.  He is able to use a Passy-Muir valve.  Has trach collar that he uses at night . Has not been using duoneb at home. Does not know how to  use he says. Is changing out inner cannula of trach daily and 4x4. No redness of drainage . No bleeding. Was told he would be referred to trach clinic but does not have appointment to date.   He is a former smoker. No previous diagnosis of COPD or asthma .  Presumed COPD on admission with as CT chest showed emphysema. Was given Dulera at discharge but did not get filled.  Denies any significant cough congestion or shortness of breath.  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     Allergies  Allergen Reactions   Simvastatin Other (See Comments)    MYALGIAS, MUSCLE WEAKNESS     Immunization History  Administered Date(s) Administered   Influenza, High Dose Seasonal PF 08/04/2022   PFIZER(Purple Top)SARS-COV-2 Vaccination 04/24/2020   Pneumococcal Conjugate-13 08/10/2013   Pneumococcal Polysaccharide-23 05/05/2012, 09/25/2014   Td (Adult),5 Lf Tetanus Toxid, Preservative Free 05/05/2012, 12/15/2012, 07/03/2013, 04/02/2018   Tdap 07/03/2013   Zoster, Live 05/05/2012, 01/30/2014    Past Medical History:  Diagnosis Date   Cigarette smoker    Coronary artery disease    Diabetes mellitus    type 1  x 50 yrs   Diabetic retinopathy (HCC)    Dyslipidemia    Hypertension    Peripheral vascular disease (HCC)     Tobacco History: Social History   Tobacco Use  Smoking Status Every Day   Packs/day: 2.00   Years: 30.00   Additional pack years: 0.00   Total pack years: 60.00   Types: Cigarettes  Smokeless Tobacco Never   Ready to quit: Not Answered Counseling given: Not Answered   Outpatient Medications Prior to Visit  Medication  Sig Dispense Refill   acetaminophen (TYLENOL) 325 MG tablet Take 2 tablets (650 mg total) by mouth every 6 (six) hours as needed for mild pain (or Fever >/= 101).     aspirin EC 81 MG tablet Take 325 mg by mouth at bedtime.     baclofen (LIORESAL) 20 MG tablet Take 20 mg by mouth daily as needed (for leg cramps). Reported on  12/05/2015  0   calcium carbonate (TUMS - DOSED IN MG ELEMENTAL CALCIUM) 500 MG chewable tablet Chew 1,000-2,000 mg by mouth as needed for indigestion or heartburn.     folic acid (FOLVITE) 800 MCG tablet Take 1,600 mcg by mouth at bedtime.     HUMALOG 100 UNIT/ML injection 2.1 mLs See admin instructions. Via PUMP Inject 2.1 ml into the pump when it runs out. Pt is unsure how often he fills his pump or the rate of his doses.     ibuprofen (ADVIL) 200 MG tablet Take 400 mg by mouth as needed for moderate pain.     metoprolol succinate (TOPROL-XL) 25 MG 24 hr tablet Take 1 tablet (25 mg total) by mouth daily. (Patient taking differently: Take 25 mg by mouth at bedtime.) 30 tablet 1   Multiple Vitamins-Minerals (MULTIVITAMIN GUMMIES ADULT PO) Take 3 tablets by mouth at bedtime.     ramipril (ALTACE) 5 MG capsule Take 5 mg by mouth at bedtime.     rosuvastatin (CRESTOR) 10 MG tablet TAKE 1 TABLET BY MOUTH  DAILY (Patient taking differently: Take 10 mg by mouth at bedtime.) 90 tablet 3   albuterol (VENTOLIN HFA) 108 (90 Base) MCG/ACT inhaler Inhale 1-2 puffs into the lungs every 6 (six) hours as needed for wheezing or shortness of breath. (Patient not taking: Reported on 01/11/2023) 1 each 1   ipratropium-albuterol (DUONEB) 0.5-2.5 (3) MG/3ML SOLN Take 3 mLs by nebulization 2 (two) times daily. (Patient not taking: Reported on 01/11/2023) 360 mL 1   mometasone-formoterol (DULERA) 200-5 MCG/ACT AERO Inhale 2 puffs into the lungs 2 (two) times daily. (Patient not taking: Reported on 01/11/2023) 13 g 1   No facility-administered medications prior to visit.     Review of Systems:   Constitutional:   No  weight loss, night sweats,  Fevers, chills, + fatigue, or  lassitude.  HEENT:   No headaches,  Difficulty swallowing,  Tooth/dental problems, or  Sore throat,                No sneezing, itching, ear ache, nasal congestion, post nasal drip, +hoarseness   CV:  No chest pain,  Orthopnea, PND, swelling in  lower extremities, anasarca, dizziness, palpitations, syncope.   GI  No heartburn, indigestion, abdominal pain, nausea, vomiting, diarrhea, change in bowel habits, loss of appetite, bloody stools.   Resp: No shortness of breath with exertion or at rest.  No excess mucus, no productive cough,  No non-productive cough,  No coughing up of blood.  No change in color of mucus.  No wheezing.  No chest wall deformity  Skin: no rash or lesions.  GU: no dysuria, change in color of urine, no urgency or frequency.  No flank pain, no hematuria   MS:  No joint pain or swelling.  No decreased range of motion.  No back pain.    Physical Exam  BP (!) 130/50 (BP Location: Left Arm, Patient Position: Sitting, Cuff Size: Normal)   Pulse 76   Ht 5\' 11"  (1.803 m)   Wt 136 lb 9.6 oz (62  kg)   SpO2 98%   BMI 19.05 kg/m   GEN: A/Ox3; pleasant , NAD, elderly and frail, thin   HEENT:  [place]/AT,  EACs-clear, TMs-wnl, NOSE-clear, THROAT-clear, no lesions, no postnasal drip or exudate noted.   NECK:  Supple w/ fair ROM; no JVD; normal carotid impulses w/o bruits; no thyromegaly or nodules palpated; no lymphadenopathy.  Janina Mayo is midline.  Stoma appears pink and moist without any redness noted.  4 x 4 gauze is clean and dry.  Currently using Passy-Muir valve with audible voice  RESP  Clear  P & A; w/o, wheezes/ rales/ or rhonchi. no accessory muscle use, no dullness to percussion  CARD:  RRR, no m/r/g, no peripheral edema, pulses intact, no cyanosis or clubbing.  GI:   Soft & nt; nml bowel sounds; no organomegaly or masses detected.   Musco: Warm bil, no deformities or joint swelling noted.   Neuro: alert, no focal deficits noted.    Skin: Warm, no lesions or rashes    Lab Results:  CBC   BMET    ProBNP No results found for: "PROBNP"  Imaging: NM PET Image Initial (PI) Skull Base To Thigh (F-18 FDG)  Result Date: 01/04/2023 CLINICAL DATA:  Initial treatment strategy for laryngeal carcinoma.  EXAM: NUCLEAR MEDICINE PET SKULL BASE TO THIGH TECHNIQUE: 6.64 mCi F-18 FDG was injected intravenously. Full-ring PET imaging was performed from the skull base to thigh after the radiotracer. CT data was obtained and used for attenuation correction and anatomic localization. Fasting blood glucose: 161 mg/dl COMPARISON:  Neck CT 16/05/9603 FINDINGS: Mediastinal blood pool activity: SUV max 1.26 Liver activity: SUV max NA NECK: The laryngeal mass is hypermetabolic with SUV max of 8.37. No associated enlarged or hypermetabolic cervical lymphadenopathy. Incidental CT findings: Bilateral carotid artery calcifications. Tracheostomy tube in good position without complicating features. CHEST: No hypermetabolic mediastinal or hilar nodes. No suspicious pulmonary nodules on the CT scan. No supraclavicular or axillary adenopathy. Incidental CT findings: Emphysematous changes and pulmonary scarring no acute pulmonary process or worrisome pulmonary lesions. Advanced atherosclerotic calcifications involving the aorta and coronary arteries. ABDOMEN/PELVIS: No abnormal hypermetabolic activity within the liver, pancreas, adrenal glands, or spleen. No hypermetabolic lymph nodes in the abdomen or pelvis. Incidental CT findings: Advanced atherosclerotic calcifications involving the aorta and iliac arteries but no aneurysm. SKELETON: No focal hypermetabolic activity to suggest skeletal metastasis. Incidental CT findings: Advanced degenerative changes involving both shoulders and both hips. IMPRESSION: 1. Hypermetabolic laryngeal mass consistent with known neoplasm. 2. No enlarged or hypermetabolic cervical lymphadenopathy. 3. No findings for metastatic disease involving the chest, abdomen/pelvis or bony structures. 4. Emphysematous changes and pulmonary scarring. 5. Advanced vascular disease. Aortic Atherosclerosis (ICD10-I70.0). Electronically Signed   By: Rudie Meyer M.D.   On: 01/04/2023 09:14   DG Swallowing Func-Speech  Pathology  Result Date: 12/15/2022 Table formatting from the original result was not included. Modified Barium Swallow Study Patient Details Name: MALYKI LECHTENBERG MRN: [id number] Date of Birth: 10/29/1950 Today's Date: 12/15/2022 HPI/PMH: HPI: 72 year old male presents to ED 4/17 after several days of gradually worsening SoB. 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 Developed stridor 4/18 and moved to ICU, tracheostomy and biopsy of laryngeal mass.  YNW:GNFA 1 diabetes mellitus, recent COPD diagnosis, coronary artery disease, hyperlipidemia, hypertension and L BKA Clinical Impression: Clinical Impression: Pt's MBS completed without a PMV due to trach not yet changed to uncuffed prior to [facility] evaluation per ENT orders. He demonstrated mild oropharyngeal dysphagia marked by decreased oral transit with pt  compensating by  thrusting/extending head to assist in oral transit and occasional minimal lingual residue. Pt noted to have reduced laryngeal elevation and epiglottic deflection resulting in penetration with thin barium before full closure could occur. Most penetration instances with thin were initially ejected from vestibule during the swallow however barium on upper 1/3 of epiglottis began to penetrate midway into vestiuble without awareness after the swallow. Pt then penetrated to vocal cords with thin. Gloved finger occlusion with cough helped mobilize penetrate to upper epiglottis. Chin tuck resulted in silent aspiration and right head turn was ineffective. Nectar thick penetrated to the cords and finger occlusion with cough moved barium higher in vestibule. Mastication and transit of solid was functional. Recommend pt continue regular texture, thin liquid with gloved finger occlusion and strong cough after every 2-3 sips liquid, pills whole in puree. Pt is being worked up for laryngeal mass and treatment plan is unknown to this SLP. Recommend follow up with home health ST for continuation of strategies and assist  during treatment process. Factors that may increase risk of adverse event in presence of aspiration Rubye Oaks & Clearance Coots 2021): Factors that may increase risk of adverse event in presence of aspiration Rubye Oaks & Clearance Coots 2021): Presence of tubes (ETT, trach, NG, etc.) Recommendations/Plan: Swallowing Evaluation Recommendations Swallowing Evaluation Recommendations Recommendations: PO diet PO Diet Recommendation: Regular; Thin liquids (Level 0) Liquid Administration via: Cup Medication Administration: Whole meds with puree Supervision: Patient able to self-feed Swallowing strategies  : Slow rate; Small bites/sips; Multiple dry swallows after each bite/sip (finger occlude trach and cough) Postural changes: Position pt fully upright for meals Oral care recommendations: Oral care BID (2x/day) Treatment Plan Treatment Plan Treatment recommendations: Therapy as outlined in treatment plan below Follow-up recommendations: Home health SLP Functional status assessment: Patient has had a recent decline in their functional status and demonstrates the ability to make significant improvements in function in a reasonable and predictable amount of time. Treatment frequency: Min 2x/week Treatment duration: 2 weeks Interventions: Compensatory techniques; Patient/family education; Diet toleration management by SLP Recommendations Recommendations for follow up therapy are one component of a multi-disciplinary discharge planning process, led by the attending physician.  Recommendations may be updated based on patient status, additional functional criteria and insurance authorization. Assessment: Orofacial Exam: Orofacial Exam Oral Cavity: Oral Hygiene: WFL Oral Cavity - Dentition: Adequate natural dentition Orofacial Anatomy: WFL Oral Motor/Sensory Function: WFL Anatomy: Anatomy: Other (Comment) (possible fullness around vocal cords -difficult to view) Boluses Administered: Boluses Administered Boluses Administered: Thin liquids (Level 0);  Mildly thick liquids (Level 2, nectar thick); Puree; Solid  Oral Impairment Domain: Oral Impairment Domain Lip Closure: No labial escape Tongue control during bolus hold: Cohesive bolus between tongue to palatal seal Bolus preparation/mastication: Timely and efficient chewing and mashing Bolus transport/lingual motion: Brisk tongue motion (extended head back to help transit bolus) Oral residue: Trace residue lining oral structures Location of oral residue : Tongue Initiation of pharyngeal swallow : Pyriform sinuses; Valleculae  Pharyngeal Impairment Domain: Pharyngeal Impairment Domain Soft palate elevation: No bolus between soft palate (SP)/pharyngeal wall (PW) Laryngeal elevation: Partial superior movement of thyroid cartilage/partial approximation of arytenoids to epiglottic petiole Anterior hyoid excursion: Complete anterior movement Epiglottic movement: Partial inversion Laryngeal vestibule closure: Incomplete, narrow column air/contrast in laryngeal vestibule Pharyngeal stripping wave : Present - diminished Pharyngeal contraction (A/P view only): N/A Pharyngoesophageal segment opening: Complete distension and complete duration, no obstruction of flow Tongue base retraction: Narrow column of contrast or air between tongue base and PPW Pharyngeal residue:  Collection of residue within or on pharyngeal structures Location of pharyngeal residue: Valleculae; Pyriform sinuses; Aryepiglottic folds; Pharyngeal wall  Esophageal Impairment Domain: Esophageal Impairment Domain Esophageal clearance upright position: Complete clearance, esophageal coating Pill: Esophageal Impairment Domain Esophageal clearance upright position: Complete clearance, esophageal coating Penetration/Aspiration Scale Score: Penetration/Aspiration Scale Score 2.  Material enters airway, remains ABOVE vocal cords then ejected out: Thin liquids (Level 0); Solid; Puree; Mildly thick liquids (Level 2, nectar thick) 3.  Material enters airway, remains  ABOVE vocal cords and not ejected out: Thin liquids (Level 0); Mildly thick liquids (Level 2, nectar thick) 8.  Material enters airway, passes BELOW cords without attempt by patient to eject out (silent aspiration) : Thin liquids (Level 0) (with a chin tuck) Compensatory Strategies: Compensatory Strategies Compensatory strategies: Yes Chin tuck: Ineffective Ineffective Chin Tuck: Thin liquid (Level 0) Right head turn: Ineffective Ineffective Right Head Turn: Thin liquid (Level 0)   General Information: Caregiver present: No  Diet Prior to this Study: Regular; Thin liquids (Level 0)   Temperature : Normal   Respiratory Status: WFL   Supplemental O2: Trach Collar   History of Recent Intubation: No  Behavior/Cognition: Alert; Cooperative Self-Feeding Abilities: Able to self-feed Baseline vocal quality/speech: -- (no pmv- no voicing with finger occlusion) Volitional Cough: -- (some air movement with finger occlusion) Volitional Swallow: Able to elicit No data recorded Goal Planning: Prognosis for improved oropharyngeal function: -- (fair-good) No data recorded No data recorded No data recorded Consulted and agree with results and recommendations: Patient Pain: Pain Assessment Pain Assessment: No/denies pain Faces Pain Scale: 4 Pain Location: Throat Pain Descriptors / Indicators: Grimacing; Guarding; Discomfort Pain Intervention(s): Limited activity within patient's tolerance; Monitored during session End of Session: Start Time:SLP Start Time (ACUTE ONLY): 1314 Stop Time: SLP Stop Time (ACUTE ONLY): 1331 Time Calculation:SLP Time Calculation (min) (ACUTE ONLY): 17 min Charges: SLP Evaluations $ SLP Speech Visit: 1 Visit SLP Evaluations $BSS Swallow: 1 Procedure $MBS Swallow: 1 Procedure SLP visit diagnosis: SLP Visit Diagnosis: Dysphagia, oropharyngeal phase (R13.12) Past Medical History: Past Medical History: Diagnosis Date  Cigarette smoker   Coronary artery disease   Diabetes mellitus   type 1  x 50 yrs  Diabetic  retinopathy   Dyslipidemia   Hypertension   Peripheral vascular disease  Past Surgical History: Past Surgical History: Procedure Laterality Date  ABDOMINAL ANGIOGRAM N/A 05/29/2014  Procedure: ABDOMINAL ANGIOGRAM;  Surgeon: Pamella Pert, MD;  Location: [facility] CATH LAB;  Service: Cardiovascular;  Laterality: N/A;  ABDOMINAL AORTOGRAM W/LOWER EXTREMITY N/A 11/14/2019  Procedure: ABDOMINAL AORTOGRAM W/LOWER EXTREMITY;  Surgeon: Nada Libman, MD;  Location: [facility] INVASIVE CV LAB;  Service: Cardiovascular;  Laterality: N/A;  AMPUTATION Left 10/25/2015  Procedure: Left Foot 5th Ray Amputation;  Surgeon: Nadara Mustard, MD;  Location: [facility] OR;  Service: Orthopedics;  Laterality: Left;  AMPUTATION Left 12/06/2015  Procedure: AMPUTATION BELOW KNEE;  Surgeon: Nadara Mustard, MD;  Location: [facility] OR;  Service: Orthopedics;  Laterality: Left;  CARDIAC CATHETERIZATION    EF is 55-60% and no wall motion abnormalities (long long time ago)  DIRECT LARYNGOSCOPY N/A 12/10/2022  Procedure: DIRECT LARYNGOSCOPY WITH BIOPSY;  Surgeon: Serena Colonel, MD;  Location: [facility] OR;  Service: ENT;  Laterality: N/A;  FEMORAL-POPLITEAL BYPASS GRAFT Left 10/24/2015  Procedure: BYPASS GRAFT FEMORAL below knee POPLITEAL ARTERY with Left Saphenous Vein;  Surgeon: Nada Libman, MD;  Location: [facility] OR;  Service: Vascular;  Laterality: Left;  FEMORAL-POPLITEAL BYPASS GRAFT Right 11/15/2019  Procedure: BYPASS GRAFT FEMORAL-POPLITEAL  ARTERY using Right Leg Greater Saphenous Vein;  Surgeon: Nada Libman, MD;  Location: [facility] OR;  Service: Vascular;  Laterality: Right;  FRACTURE SURGERY    left arm "many yrs ago"  LOWER EXTREMITY ANGIOGRAM N/A 01/23/2014  Procedure: LOWER EXTREMITY ANGIOGRAM;  Surgeon: Pamella Pert, MD;  Location: [facility] CATH LAB;  Service: Cardiovascular;  Laterality: N/A;  LOWER EXTREMITY ANGIOGRAM N/A 07/31/2014  Procedure: LOWER EXTREMITY ANGIOGRAM;  Surgeon: Pamella Pert, MD;  Location: [facility] CATH LAB;  Service: Cardiovascular;  Laterality: N/A;  LOWER  EXTREMITY ANGIOGRAM Left 10/11/2015  Procedure: Lower Extremity Angiogram;  Surgeon: Nada Libman, MD;  Location: [facility] INVASIVE CV LAB;  Service: Cardiovascular;  Laterality: Left;  PERIPHERAL VASCULAR BALLOON ANGIOPLASTY  11/14/2019  Procedure: PERIPHERAL VASCULAR BALLOON ANGIOPLASTY;  Surgeon: Nada Libman, MD;  Location: [facility] INVASIVE CV LAB;  Service: Cardiovascular;;  PERIPHERAL VASCULAR CATHETERIZATION Left 10/11/2015  Procedure: Peripheral Vascular Balloon Angioplasty;  Surgeon: Nada Libman, MD;  Location: [facility] INVASIVE CV LAB;  Service: Cardiovascular;  Laterality: Left;  sfa failed unable to cross occluded sfa  PERIPHERAL VASCULAR CATHETERIZATION N/A 10/11/2015  Procedure: Abdominal Aortogram;  Surgeon: Nada Libman, MD;  Location: [facility] INVASIVE CV LAB;  Service: Cardiovascular;  Laterality: N/A;  STUMP REVISION Left 03/02/2016  Procedure: Revision Left Below Knee Amputation;  Surgeon: Nadara Mustard, MD;  Location: [facility] OR;  Service: Orthopedics;  Laterality: Left;  TRACHEOSTOMY TUBE PLACEMENT N/A 12/10/2022  Procedure: TRACHEOSTOMY UNDER LOCAL;  Surgeon: Serena Colonel, MD;  Location: [facility] OR;  Service: ENT;  Laterality: N/A; [name] 12/15/2022, 2:46 PM  DG Chest Port 1 View  Result Date: 12/15/2022 CLINICAL DATA:  Encounter for pneumothorax. EXAM: PORTABLE CHEST 1 VIEW COMPARISON:  12/13/2022 FINDINGS: Persistent lucency at the right lung apex is compatible with a small pneumothorax which is minimally changed. Again noted is subcutaneous air on the left side of the chest. There is decreased subcutaneous air in the lower neck regions. Patient has a tracheostomy tube. Heart size is stable. Patchy interstitial densities in the lower lungs, right side greater than left. IMPRESSION: 1. Stable small right apical pneumothorax. 2. Slightly decreased subcutaneous air. 3. Patchy interstitial densities in both lungs. Findings are nonspecific but could represent atelectasis or interstitial edema. Atypical  infection cannot be excluded. Recommend continued follow-up. Electronically Signed   By: Richarda Overlie M.D.   On: 12/15/2022 09:43   DG Chest Port 1 View  Result Date: 12/13/2022 CLINICAL DATA:  Follow-up pneumothorax. EXAM: PORTABLE CHEST 1 VIEW COMPARISON:  Multiple recent chest x-rays. FINDINGS: The tracheostomy tube is stable. Small residual right apical pneumothorax, less than 5%. Persistent but improving subcutaneous emphysema and pneumomediastinum. Stable advanced emphysematous changes and pulmonary scarring. IMPRESSION: 1. Small residual right apical pneumothorax, less than 5%. 2. Persistent but improving subcutaneous emphysema and pneumomediastinum. Electronically Signed   By: Rudie Meyer M.D.   On: 12/13/2022 09:10          No data to display          No results found for: "NITRICOXIDE"      Assessment & Plan:   Glottis carcinoma (HCC) Newly diagnosed glottis carcinoma-laryngeal mass hypermetabolic on PET with pathology showing keratinizing squamous cell carcinoma. Patient has been seen by ENT, oncology and radiation oncology.  Currently being recommended for chemo radiation versus total laryngectomy plus or minus radiation pending final path results.  Patient says he is leaning towards total laryngectomy. Recommended follow-up with ENT as planned.   Acute respiratory  failure with hypoxia (HCC) Newly diagnosed laryngeal mass with upper airway obstruction requiring emergent tracheostomy on December 10, 2022.  Status post trach change with Shiley 6.0 cuffless trach on December 16, 2022.  Patient education given on trach care. Will refer to the trach clinic for ongoing management.  Continue follow-up with ENT.   Tracheostomy status (HCC) Continue with trach care.  Continue follow-up with ENT.  Established with trach clinic for ongoing management and care  Plan  Patient Instructions  Refer to [name] clinic.  Trach care as discussed.  Albuterol inhaler 1-2 puffs every 6hr as  needed  Duoneb every 6hr as needed Trach collar At bedtime   Follow up with  Follow up with Dr. Pollyann Kennedy with ENT as discussed  Follow up with oncology and Radiation oncology as directed.  Follow up with Dr. Wynona Neat or Ivoree Felmlee NP in 3 months and As needed     Protein-calorie malnutrition, severe (HCC) Encouraged on a high-protein diet.  COPD (chronic obstructive pulmonary disease) (HCC) Presumed COPD with smoking history.  Emphysema noted on CT scan.  Patient has minimum symptom burden.  Recommend can use albuterol and DuoNeb as needed.  Patient education was given as he did not know how to use his nebulizer or inhaler.    Plan  Patient Instructions  Refer to [facility] clinic.  Trach care as discussed.  Albuterol inhaler 1-2 puffs every 6hr as needed  Duoneb every 6hr as needed Trach collar At bedtime   Follow up with  Follow up with Dr. Pollyann Kennedy with ENT as discussed  Follow up with oncology and Radiation oncology as directed.  Follow up with Dr. Wynona Neat or Jayveon Convey NP in 3 months and As needed  '  I spent  45  minutes dedicated to the care of this patient on the date of this encounter to include pre-visit review of records, face-to-face time with the patient discussing conditions above, post visit ordering of testing, clinical documentation with the electronic health record, making appropriate referrals as documented, and communicating necessary findings to members of the patients care team.    Rubye Oaks, NP 01/11/2023

## 2023-01-11 NOTE — Assessment & Plan Note (Signed)
Continue with trach care.  Continue follow-up with ENT.  Established with trach clinic for ongoing management and care  Plan  Patient Instructions  Refer to [facility] clinic.  Trach care as discussed.  Albuterol inhaler 1-2 puffs every 6hr as needed  Duoneb every 6hr as needed Trach collar At bedtime   Follow up with  Follow up with Dr. Pollyann Kennedy with ENT as discussed  Follow up with oncology and Radiation oncology as directed.  Follow up with Dr. Wynona Neat or Hadassah Rana NP in 3 months and As needed

## 2023-01-11 NOTE — Progress Notes (Signed)
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 10/11/2015  Procedure: Lower Extremity Angiogram;  Surgeon: Nada Libman, MD;  Location: [facility] INVASIVE CV LAB;  Service: Cardiovascular;  Laterality: Left;  PERIPHERAL VASCULAR BALLOON ANGIOPLASTY  11/14/2019  Procedure: PERIPHERAL VASCULAR BALLOON ANGIOPLASTY;  Surgeon: Nada Libman, MD;  Location: [facility] INVASIVE CV LAB;  Service: Cardiovascular;;  PERIPHERAL VASCULAR CATHETERIZATION Left 10/11/2015  Procedure: Peripheral Vascular Balloon Angioplasty;  Surgeon: Nada Libman, MD;  Location: [facility] INVASIVE CV LAB;  Service: Cardiovascular;  Laterality: Left;  sfa failed unable to cross occluded sfa  PERIPHERAL VASCULAR CATHETERIZATION N/A 10/11/2015  Procedure: Abdominal Aortogram;  Surgeon: Nada Libman, MD;  Location: [facility] INVASIVE CV LAB;  Service: Cardiovascular;  Laterality: N/A;  STUMP REVISION Left 03/02/2016  Procedure: Revision Left Below Knee Amputation;  Surgeon: Nadara Mustard, MD;  Location: [facility] OR;  Service: Orthopedics;  Laterality: Left;  TRACHEOSTOMY TUBE PLACEMENT N/A 12/10/2022  Procedure: TRACHEOSTOMY UNDER LOCAL;  Surgeon: Serena Colonel, MD;  Location: [facility] OR;  Service: ENT;  Laterality: N/A; [name] 12/15/2022, 2:46 PM  DG Chest Port 1 [name]  Result Date: 12/15/2022 CLINICAL DATA:  Encounter for pneumothorax. EXAM: PORTABLE CHEST 1 [name] COMPARISON:  12/13/2022 FINDINGS: Persistent lucency at the  right lung apex is compatible with a small pneumothorax which is minimally changed. Again noted is subcutaneous air on the left side of the chest. There is decreased subcutaneous air in the lower neck regions. Patient has a tracheostomy tube. Heart size is stable. Patchy interstitial densities in the lower lungs, right side greater than left. IMPRESSION: 1. Stable small right apical pneumothorax. 2. Slightly decreased subcutaneous air. 3. Patchy interstitial densities in both lungs. Findings are nonspecific but could represent atelectasis or interstitial edema. Atypical infection cannot be excluded. Recommend continued follow-up. Electronically Signed  By: Richarda Overlie M.D.   On: 12/15/2022 09:43   DG Chest Port 1 [name]  Result Date: 12/13/2022 CLINICAL DATA:  Follow-up pneumothorax. EXAM: PORTABLE CHEST 1 [name] COMPARISON:  Multiple recent chest x-rays. FINDINGS: The tracheostomy tube is stable. Small residual right apical pneumothorax, less than 5%. Persistent but improving subcutaneous emphysema and pneumomediastinum. Stable advanced emphysematous changes and pulmonary scarring. IMPRESSION: 1. Small residual right apical pneumothorax, less than 5%. 2. Persistent but improving subcutaneous emphysema and pneumomediastinum. Electronically Signed   By: Rudie Meyer M.D.   On: 12/13/2022 09:10    All questions were answered. The patient knows to call the clinic with any problems, questions or concerns. I spent 45 minutes in the care of this patient including H and P, review of records, counseling and coordination of care.     Rachel Moulds, MD 01/11/2023 10:09 AM

## 2023-01-11 NOTE — Assessment & Plan Note (Signed)
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 Patient has been seen by ENT, oncology and radiation oncology.  Currently being recommended for chemo radiation versus total laryngectomy plus or minus radiation pending final path results.  Patient says he is leaning towards total laryngectomy. Recommended follow-up with ENT as planned.

## 2023-01-11 NOTE — Patient Instructions (Addendum)
Refer to [facility] clinic.  Trach care as discussed.  Albuterol inhaler 1-2 puffs every 6hr as needed  Duoneb every 6hr as needed Trach collar At bedtime   Follow up with  Follow up with Dr. Pollyann Kennedy with ENT as discussed  Follow up with oncology and Radiation oncology as directed.  Follow up with Dr. Wynona Neat or Lige Lakeman NP in 3 months and As needed

## 2023-01-11 NOTE — Progress Notes (Signed)
Oncology Nurse Navigator Documentation   Met with patient during initial consult with Dr. Al Pimple. He was accompanied by his friend, [name].  Further introduced myself as his/their Navigator, explained my role as a member of the Care Team. [name] is deciding between surgery and chemotherapy/radiation. He has spoken with Dr. Basilio Cairo, Dr. Al Pimple, and Dr. Pollyann Kennedy. He is awaiting an appointment for surgery. He will call me when he makes his decision. They verbalized understanding of information provided. I encouraged them to call with questions/concerns moving forward.  Hedda Slade, RN, BSN, OCN Head & Neck Oncology Nurse Navigator [facility] [phone]

## 2023-01-11 NOTE — Assessment & Plan Note (Signed)
Presumed COPD with smoking history.  Emphysema noted on CT scan.  Patient has minimum symptom burden.  Recommend can use albuterol and DuoNeb as needed.  Patient education was given as he did not know how to use his nebulizer or inhaler.    Plan  Patient Instructions  Refer to [facility] clinic.  Trach care as discussed.  Albuterol inhaler 1-2 puffs every 6hr as needed  Duoneb every 6hr as needed Trach collar At bedtime   Follow up with  Follow up with Dr. Pollyann Kennedy with ENT as discussed  Follow up with oncology and Radiation oncology as directed.  Follow up with Dr. Wynona Neat or Sharonann Malbrough NP in 3 months and As needed  '

## 2023-01-11 NOTE — Progress Notes (Signed)
Patient seen in the office today and instructed on use of nebulizer tubing and machine.  Patient expressed understanding and demonstrated technique.

## 2023-01-11 NOTE — Assessment & Plan Note (Signed)
Encouraged on a high-protein diet 

## 2023-01-11 NOTE — Assessment & Plan Note (Signed)
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  Status post trach change with Shiley 6.0 cuffless trach on December 16, 2022.  Patient education given on trach care. Will refer to the trach clinic for ongoing management.  Continue follow-up with ENT.

## 2023-01-13 ENCOUNTER — Telehealth: Payer: Self-pay | Admitting: *Deleted

## 2023-01-13 NOTE — Telephone Encounter (Signed)
[facility] trach clinic [phone] to check status of referral.  Phone rang with no answer.  No vm.  Will attempt again later.  Attempting to get patient established with the [facility] clinic.  I have been unable to reach the clinic at the above number.  Is there another number to call to assure they get the referral?  He is a recent hospital d/c with a new trach.  Please advise.

## 2023-01-19 ENCOUNTER — Encounter: Payer: Self-pay | Admitting: Cardiology

## 2023-01-26 NOTE — Progress Notes (Signed)
     Your surgery and Pre-Admission testing visit will be at [facility] located at [address], [place], [place] [address].  Please let all your doctors (i.e., Primary Care Physician, Cardiologist, Endocrinologist, Pulmonologist) know you are having surgery. You may need clearance for surgery. If you are on blood thinners, notify your surgeon and ask the doctor who prescribed them how long to hold them before surgery.  If you have had a heart test, such as an EKG, stress test, heart ultrasound, etc., or lab work performed outside of [facility], please bring copies of these tests to your Pre-Admission testing, if possible.  These departments may contact you before the day of surgery:  Pre-Service Center - insurance/ billing: [phone] Pharmacy- to review your medications: [phone] Pre-Admission Testing- to set an appointment for your visit: [phone]  (Often, these numbers show up as "SPAM" on your phone)  The Pre-Admission Testing (PAT) visit focuses on Anesthesia for your upcoming surgery.  You do NOT need to fast; take your medications as usual. Please arrive 30 minutes early to allow for parking and admitting.  The visit may last up to an hour. Bring a photo ID and medical insurance card. Reschedule if you are sick. ([phone]) and please, NO children under age 16 at the visit.  During the PAT visit:  We will review your medical and surgical history.   You will receive pre-operative instructions, including the time of arrival at the hospital and surgical start time.  We will review what medication(s) you can take on the day of surgery.  After speaking with the nurse, you will have blood drawn and, if needed, a chest x-ray and EKG.  Most lab results from your doctor are good for 30 days, Hemoglobin A1C is good for 60 days. If you cannot talk to the Pharmacy, bring your medications or a list of them to the PST visit.   Infection control for the [facility] requires: All fingernail and toenail products should be removed before the day of surgery.  (SNS, Acrylic, Gel, Polish, Stickers, Press on, and Poly gel nails.)   Parking information:  Address:  Hospital - [address], [place],  [address]  Please look for signs for entrance A off of [address]. Free valet parking is available Monday-Friday 05:30am-06:00pm     

## 2023-01-29 NOTE — Pre-Procedure Instructions (Signed)
Surgical Instructions    Your procedure is scheduled on February 05, 2023.  Report to [facility] Main Entrance "A" at 9:15 A.M., then check in with the Admitting office.  Call this number if you have problems the morning of surgery:  [phone]   If you have any questions prior to your surgery date call [phone]: Open Monday-Friday 8am-4pm If you experience any cold or flu symptoms such as cough, fever, chills, shortness of breath, etc. between now and your scheduled surgery, please notify us at the above number     Remember:  Do not eat or drink after midnight the night before your surgery   Take these medicines the morning of surgery with A SIP OF WATER:   If needed: acetaminophen (TYLENOL)  albuterol (VENTOLIN HFA) 108 (90 Base) MCG/ACT inhaler - Please bring all inhalers with you the day of surgery.  ipratropium-albuterol (DUONEB) 0.5-2.5 (3) MG/3ML SOLN  mometasone-formoterol (DULERA) 200-5 MCG/ACT AERO   Follow your surgeon's instructions on when to stop Aspirin.  If no instructions were given by your surgeon then you will need to call the office to get those instructions.    As of today, STOP taking any Aleve, Naproxen, Ibuprofen, Motrin, Advil, Goody's, BC's, all herbal medications, fish oil, and all vitamins.          WHAT DO I DO ABOUT MY DIABETES MEDICATION?   Do not take oral diabetes medicines (pills) the morning of surgery.  CONTACT your PCP or endocrinologist for instructions regarding your insulin pump OR reduce all basal rates by 20% at midnight the night before surgery.   The day of surgery, do not take other diabetes injectables, including Byetta (exenatide), Bydureon (exenatide ER), Victoza (liraglutide), or Trulicity (dulaglutide).  If your CBG is greater than 220 mg/dL, you may take  of your sliding scale (correction) dose of insulin.   HOW TO MANAGE YOUR DIABETES BEFORE AND AFTER SURGERY  Why is it important to control my blood sugar before and  after surgery? Improving blood sugar levels before and after surgery helps healing and can limit problems. A way of improving blood sugar control is eating a healthy diet by:  Eating less sugar and carbohydrates  Increasing activity/exercise  Talking with your doctor about reaching your blood sugar goals High blood sugars (greater than 180 mg/dL) can raise your risk of infections and slow your recovery, so you will need to focus on controlling your diabetes during the weeks before surgery. Make sure that the doctor who takes care of your diabetes knows about your planned surgery including the date and location.  How do I manage my blood sugar before surgery? Check your blood sugar at least 4 times a day, starting 2 days before surgery, to make sure that the level is not too high or low.  Check your blood sugar the morning of your surgery when you wake up and every 2 hours until you get to the Short Stay unit.  If your blood sugar is less than 70 mg/dL, you will need to treat for low blood sugar: Do not take insulin. Treat a low blood sugar (less than 70 mg/dL) with  cup of clear juice (cranberry or apple), 4 glucose tablets, OR glucose gel. Recheck blood sugar in 15 minutes after treatment (to make sure it is greater than 70 mg/dL). If your blood sugar is not greater than 70 mg/dL on recheck, call [phone] for further instructions. Report your blood sugar to the short stay nurse when you get  to Short Stay.  If you are admitted to the hospital after surgery: Your blood sugar will be checked by the staff and you will probably be given insulin after surgery (instead of oral diabetes medicines) to make sure you have good blood sugar levels. The goal for blood sugar control after surgery is 80-180 mg/dL.   [facility] is not responsible for any belongings or valuables.    Do NOT Smoke (Tobacco/Vaping)  24 hours prior to your procedure  If you use a CPAP at night, you may bring your mask  for your overnight stay.   Contacts, glasses, hearing aids, dentures or partials may not be worn into surgery, please bring cases for these belongings   For patients admitted to the hospital, discharge time will be determined by your treatment team.   Patients discharged the day of surgery will not be allowed to drive home, and someone needs to stay with them for 24 hours.   SURGICAL WAITING ROOM VISITATION Patients having surgery or a procedure may have no more than 2 support people in the waiting area - these visitors may rotate.   Children under the age of 64 must have an adult with them who is not the patient. If the patient needs to stay at the hospital during part of their recovery, the visitor guidelines for inpatient rooms apply. Pre-op nurse will coordinate an appropriate time for 1 support person to accompany patient in pre-op.  This support person may not rotate.   Please refer to [email or web address] for the visitor guidelines for Inpatients (after your surgery is over and you are in a regular room).    Special instructions:    Oral Hygiene is also important to reduce your risk of infection.  Remember - BRUSH YOUR TEETH THE MORNING OF SURGERY WITH YOUR REGULAR TOOTHPASTE   [facility]- Preparing For Surgery  Before surgery, you can play an important role. Because skin is not sterile, your skin needs to be as free of germs as possible. You can reduce the number of germs on your skin by washing with CHG (chlorahexidine gluconate) Soap before surgery.  CHG is an antiseptic cleaner which kills germs and bonds with the skin to continue killing germs even after washing.     Please do not use if you have an allergy to CHG or antibacterial soaps. If your skin becomes reddened/irritated stop using the CHG.  Do not shave (including legs and underarms) for at least 48 hours prior to first CHG shower. It is OK to shave your face.  Please  follow these instructions carefully.     Shower the NIGHT BEFORE SURGERY and the MORNING OF SURGERY with CHG Soap.   If you chose to wash your hair, wash your hair first as usual with your normal shampoo. After you shampoo, rinse your hair and body thoroughly to remove the shampoo.  Then [employer] and genitals (private parts) with your normal soap and rinse thoroughly to remove soap.  After that Use CHG Soap as you would any other liquid soap. You can apply CHG directly to the skin and wash gently with a scrungie or a clean washcloth.   Apply the CHG Soap to your body ONLY FROM THE NECK DOWN.  Do not use on open wounds or open sores. Avoid contact with your eyes, ears, mouth and genitals (private parts). Wash Face and genitals (private parts)  with your normal soap.   Wash thoroughly, paying special attention to the area where your surgery  will be performed.  Thoroughly rinse your body with warm water from the neck down.  DO NOT shower/wash with your normal soap after using and rinsing off the CHG Soap.  Pat yourself dry with a CLEAN TOWEL.  Wear CLEAN PAJAMAS to bed the night before surgery  Place CLEAN SHEETS on your bed the night before your surgery  DO NOT SLEEP WITH PETS.   Day of Surgery:  Take a shower with CHG soap. Wear Clean/Comfortable clothing the morning of surgery Do not wear jewelry or makeup. Do not wear lotions, powders, perfumes/cologne or deodorant. Do not shave 48 hours prior to surgery.  Men may shave face and neck. Do not bring valuables to the hospital. Do not wear nail polish, gel polish, artificial nails, or any other type of covering on natural nails (fingers and toes) If you have artificial nails or gel coating that need to be removed by a nail salon, please have this removed prior to surgery. Artificial nails or gel coating may interfere with anesthesia's ability to adequately monitor your vital signs. Remember to brush your teeth WITH YOUR REGULAR  TOOTHPASTE.    If you received a COVID test during your pre-op visit, it is requested that you wear a mask when out in public, stay away from anyone that may not be feeling well, and notify your surgeon if you develop symptoms. If you have been in contact with anyone that has tested positive in the last 10 days, please notify your surgeon.    Please read over the following fact sheets that you were given.

## 2023-02-01 ENCOUNTER — Other Ambulatory Visit: Payer: Self-pay

## 2023-02-01 ENCOUNTER — Encounter (HOSPITAL_COMMUNITY): Payer: Self-pay

## 2023-02-01 ENCOUNTER — Encounter (HOSPITAL_COMMUNITY)
Admission: RE | Admit: 2023-02-01 | Discharge: 2023-02-01 | Disposition: A | Discharge status: Home / Self Care | Payer: Medicare Other | Source: Ambulatory Visit | Attending: Otolaryngology | Admitting: Otolaryngology

## 2023-02-01 VITALS — BP 134/62 | HR 79 | Temp 98.4°F | Resp 18 | Ht 71.0 in | Wt 132.1 lb

## 2023-02-01 DIAGNOSIS — Z01812 Encounter for preprocedural laboratory examination: Secondary | ICD-10-CM | POA: Diagnosis present

## 2023-02-01 DIAGNOSIS — Z794 Long term (current) use of insulin: Secondary | ICD-10-CM | POA: Diagnosis not present

## 2023-02-01 DIAGNOSIS — E10319 Type 1 diabetes mellitus with unspecified diabetic retinopathy without macular edema: Secondary | ICD-10-CM | POA: Insufficient documentation

## 2023-02-01 DIAGNOSIS — Z1152 Encounter for screening for COVID-19: Secondary | ICD-10-CM | POA: Diagnosis not present

## 2023-02-01 DIAGNOSIS — I252 Old myocardial infarction: Secondary | ICD-10-CM | POA: Insufficient documentation

## 2023-02-01 DIAGNOSIS — E109 Type 1 diabetes mellitus without complications: Secondary | ICD-10-CM | POA: Insufficient documentation

## 2023-02-01 DIAGNOSIS — Z87891 Personal history of nicotine dependence: Secondary | ICD-10-CM | POA: Diagnosis not present

## 2023-02-01 DIAGNOSIS — E785 Hyperlipidemia, unspecified: Secondary | ICD-10-CM | POA: Diagnosis not present

## 2023-02-01 DIAGNOSIS — I10 Essential (primary) hypertension: Secondary | ICD-10-CM | POA: Diagnosis not present

## 2023-02-01 DIAGNOSIS — E1051 Type 1 diabetes mellitus with diabetic peripheral angiopathy without gangrene: Secondary | ICD-10-CM | POA: Diagnosis not present

## 2023-02-01 DIAGNOSIS — C329 Malignant neoplasm of larynx, unspecified: Secondary | ICD-10-CM | POA: Insufficient documentation

## 2023-02-01 DIAGNOSIS — I2510 Atherosclerotic heart disease of native coronary artery without angina pectoris: Secondary | ICD-10-CM | POA: Insufficient documentation

## 2023-02-01 DIAGNOSIS — Z01818 Encounter for other preprocedural examination: Secondary | ICD-10-CM

## 2023-02-01 HISTORY — DX: Malignant (primary) neoplasm, unspecified: C80.1

## 2023-02-01 HISTORY — DX: Acute myocardial infarction, unspecified: I21.9

## 2023-02-01 HISTORY — DX: Chronic obstructive pulmonary disease, unspecified: J44.9

## 2023-02-01 LAB — BASIC METABOLIC PANEL
Anion gap: 10 (ref 5–15)
BUN: 16 mg/dL (ref 8–23)
CO2: 24 mmol/L (ref 22–32)
Calcium: 9.2 mg/dL (ref 8.9–10.3)
Chloride: 101 mmol/L (ref 98–111)
Creatinine, Ser: 0.8 mg/dL (ref 0.61–1.24)
GFR, Estimated: >60 mL/min (ref >60)
Glucose, Bld: 202 mg/dL — ABNORMAL HIGH (ref 70–99)
Potassium: 4.7 mmol/L (ref 3.5–5.1)
Sodium: 135 mmol/L (ref 135–145)

## 2023-02-01 LAB — CBC
HCT: 36.9 % — ABNORMAL LOW (ref 39.0–52.0)
Hemoglobin: 12.3 g/dL — ABNORMAL LOW (ref 13.0–17.0)
MCH: 32.3 pg (ref 26.0–34.0)
MCHC: 33.3 g/dL (ref 30.0–36.0)
MCV: 96.9 fL (ref 80.0–100.0)
Platelets: 327 10*3/uL (ref 150–400)
RBC: 3.81 MIL/uL — ABNORMAL LOW (ref 4.22–5.81)
RDW: 13.2 % (ref 11.5–15.5)
WBC: 7.7 10*3/uL (ref 4.0–10.5)
nRBC: 0 % (ref 0.0–0.2)

## 2023-02-01 LAB — TYPE AND SCREEN
ABO/RH(D): A POS
Antibody Screen: NEGATIVE

## 2023-02-01 LAB — SARS CORONAVIRUS 2 (TAT 6-24 HRS): SARS Coronavirus 2: NEGATIVE

## 2023-02-01 LAB — GLUCOSE, CAPILLARY: Glucose-Capillary: 300 mg/dL — ABNORMAL HIGH (ref 70–99)

## 2023-02-01 NOTE — Progress Notes (Signed)
During suicide risk assessment, pt answered "yes" that in the last month he has wished that he was dead. Pt answered "yes" that in the last month that he has had thoughts of ending his life. Pt answered "yes" that he has thought of ways he might do this, but states that he has not had any intent to do this. Pt answered "no" that he has not worked out details of how to kill himself or had any intent to carry out a plan. Pt reports he has not done anything to prepare to end his life. Pt denies having any suicidal thoughts today/currently. Pt reports he has family he can talk to about this. I offered to take pt to ED to be evaluated, or to contact CSW to speak with patient today. Pt states he does not need to talk to anyone about this today. I notified anesthesia APPs as well as care coordinator in pre-op. I spoke with CSW Carollee Herter. Per Carollee Herter, pt can willingly come to be evaluated in ED if he desires, or if provider was concerned pt could be IVC. No need for IVC per anesthesia APP. 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 Pt informed that he can reach out to his PCP regarding these thoughts.

## 2023-02-01 NOTE — Progress Notes (Signed)
PCP - [name] Cardiologist - Dr Tacey Ruiz CV  PPM/ICD - denies   Chest x-ray - 12/15/22 EKG - 12/09/22 Stress Test -  ECHO - 11/27/22 Cardiac Cath - 2006  Sleep Study - denies   Fasting Blood Sugar -Type 1 DM- Pt reports fasting blood sugar is typically around 120. Pt uses Freestyle Libre CGM to left arm and Medtronic Mini Insulin pump. Pt basal rate currently set to 0.7 units/hour. Pt reports that he does not know how to change his basal rate, and Dr. Ferd Hibbs office helps change his basal rates for him.  Pt gives himself boluses based on his blood sugar. Pt reports he gave himself a 4.5 unit bolus upon arrival to hospital. CBG 300 upon arrival to PAT. Pt reports he ate an English muffin with grape jelly at 9:30 this morning.  Anesthesia APP notified of elevated blood sugar. I spoke with DM coordinator Carollee Herter at PAT appointment. Per DM coordinator, pt needs to reach out to Dr. Ferd Hibbs office for instructions regarding his insulin pump settings/basal rate prior to surgery. CBG 229 on CGM prior to patient leaving PAT appointment.   Pt with trach in place with PM valve. Pt unsure of type of trach that he has. Pt states he does occasionally cough up clear secretions. Pt notes cough and soreness when coughing since he had the trach placed. Pt states he has had limited education on how to take care of trach at home and only learned from watching the nurses in the hospital. Pt states he has suction at home, but does not know how to set up the machine etc. Pt educated to reach out to Dr. Lucky Rathke office and home care agency regarding any trach supplies/equipment issues. Pt verbalized understanding. Pt asked to bring obturator with him on DOS. Pt states he does not have one of these.  Pt reports he has a machine that has tubing with mask that he puts over his trach at night. Pt unsure if this is oxygen. Pt states he does not have an oxygen tank at home.   Last dose of GLP1 agonist-  N/A  Blood  [facility] Instructions: N/A Aspirin Instructions: Pt last dose of ASA 01/29/23  ERAS Protcol - NPO order   COVID TEST- 02/01/2023    Anesthesia review: yes- CBG 300 at PAT. Pt on insulin pump. Pt has trach in place  Patient denies shortness of breath, fever, cough and chest pain at PAT appointment   All instructions explained to the patient, with a verbal understanding of the material. Patient agrees to go over the instructions while at home for a better understanding. The opportunity to ask questions was provided.

## 2023-02-02 NOTE — Progress Notes (Signed)
Anesthesia Chart Review:  Case: [id number] Date/Time: 02/05/23 0945   Procedures:      TOTAL LARYNGECTOMY     NECK DISSECTION (Bilateral)   Anesthesia type: General   Pre-op diagnosis: Laryngeal cancer   [facility] ROOM 08 / [facility]   Surgeons: Serena Colonel, MD       DISCUSSION: Patient is a 72 year old male scheduled for the above procedure. He is s/p awake tracheostomy, direct laryngoscopy with biopsy of laryngeal mass 12/10/22, + SCC.   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   [facility] admission 12/09/22-12/17/22. He presented with [facility] ED with worsening dyspnea and labored breathing despite home inhaler use. He was treated initially with Duoneb and magnesium with temporary improvement. CXR without acute process. Recent CT negative for PE. Concern for upper airway obstruction and ENT consulted. Transferred to [facility]. Fiberoptic exam revealed an exophytic mass of the right vocal cord appearing to extend through the entire cord. CT neck showed thickening of vocal cords with right vocal fold nodule concerning for ligament C.  Given inspiratory stridor recommendation for tracheostomy under local anesthesia followed by direct laryngoscopy with biopsy. This was done on 12/10/22. #6 Shiley cuffed trach placed. He has a small right apical pneumothorax with subcutaneous emphysema post-operatively was monitored by serial x-rays.  Trach changed to #6 Shiley cuffless on 4//24/24.  Pathology positive for squamous cell carcinoma.  Outpatient ENT, pulmonology,  oncology, and PCP follow-up planned.   He had post-hospitalization pulmonology follow-up by Rubye Oaks, NP. Patient was still deciding about treatment options for right glottic cancer, but was leaning towards total laryngectomy. He was doing okay with his trach and tolerating a Passy-Muir valve. He was using trach collar at night. He was changing out inner canula at home. He had not yet been referred to the trach clinic, so referral sent. She advised continue ENT and oncology follow-up. 3 month pulmonology follow-up planned.    Last cardiology visit with Dr. Jacinto Halim was on 10/19/22. 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 Was still smoking 1/2 PPD. No new testing ordered. Follow-up in 6 months planned.   A1c 8.0% 12/09/22. He uses a Freestyle Libre CGM to LUE and Medtronic Mini Insulin pump. PCP Dr. Timothy Lasso helps manage basal rates/reprogramming. Patient advised to contact Dr. Ferd Hibbs office for recommendations given he in unable to change basal rate himself.  Diabetes coordinator was contacted.   Reviewed PAT RN notes regarding mood.    02/01/23 COVID-19 presurgical test negative.  As of 01/15/23, he had a #6 cuffless Shiley trach. Anesthesia team to evaluate on the day of surgery.   VS: BP 134/62   Pulse 79   Temp 36.9 C   Resp 18   Ht 5\' 11"  (1.803 m)   Wt 59.9 kg   BMI 18.42 kg/m    PROVIDERS: [name], MD is PCP  Yates Decamp, MD is cardiologist Cyril Mourning, MD is pulmonologist Rachel Moulds, MD is HEM-ONC Lonie Peak, MD is RAD-ONC Venida Jarvis, MD is vascular surgeon Serena Colonel, MD is ENT   LABS: Preoperative labs noted.  A1c 7.5% 07/28/22 and 8.0% 12/09/22 (  [facility]) (all labs ordered are listed, but only abnormal results are displayed)  Labs Reviewed  GLUCOSE, CAPILLARY - Abnormal; Notable for the following components:      Result Value   Glucose-Capillary 300 (*)    All other components within normal limits  BASIC METABOLIC PANEL - Abnormal; Notable  for the following components:   Glucose, Bld 202 (*)    All other components within normal limits  CBC - Abnormal; Notable for the following components:   RBC 3.81 (*)    Hemoglobin 12.3 (*)    HCT 36.9 (*)    All other components within normal limits  SARS CORONAVIRUS 2 (TAT 6-24 HRS)  TYPE AND SCREEN     IMAGES: PET Scan 12/31/22: IMPRESSION: 1. Hypermetabolic laryngeal mass consistent with known neoplasm. 2. No enlarged or hypermetabolic cervical lymphadenopathy. 3. No findings for metastatic disease involving the chest, abdomen/pelvis or bony structures. 4. Emphysematous changes and pulmonary scarring. 5. Advanced vascular disease. - Aortic Atherosclerosis (ICD10-I70.0).  Modified Barium Swallow Study 12/15/22: Clinical Impression: Pt's MBS completed without a PMV due to trach not yet changed to uncuffed prior to PMV evaluation per ENT orders. He demonstrated mild oropharyngeal dysphagia marked by decreased oral transit with pt compensating by thrusting/extending head to assist in oral transit and occasional minimal lingual residue. Pt noted to have reduced laryngeal elevation and epiglottic deflection resulting in penetration with thin barium before full closure could occur... Recommend pt continue regular texture, thin liquid with gloved finger occlusion and strong cough after every 2-3 sips liquid, pills whole in puree. Pt is being worked up for laryngeal mass and treatment plan is unknown to this SLP. Recommend follow up with home health ST for continuation of strategies and assist during treatment process.   1V PCXR 12/15/22: IMPRESSION: 1. Stable small right apical pneumothorax. 2. Slightly decreased subcutaneous air. 3. Patchy interstitial densities in both lungs. Findings are nonspecific but could represent atelectasis or interstitial edema. Atypical infection cannot be excluded. Recommend continued follow-up.   CT Soft tissue neck 12/10/22: IMPRESSION: 1. Thickening of  the bilateral vocal cords, right-greater-than-left, with medialization of the right vocal fold and an exophytic nodule arising from the right vocal fold measuring 5 x 9 x 7 mm. 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 2. No evidence of cervical lymphadenopathy. 3. Emphysema.   EKG: 12/09/22:  Sinus tachycardia at 105 bpm Biatrial enlargement Borderline repolarization abnormality Confirmed by Drema Pry [phone]) on 12/09/2022 6:25:08 AM   CV: Echo 11/27/22: IMPRESSIONS   1. Left ventricular ejection fraction, by estimation, is >75%. The left  ventricle has hyperdynamic function. The left ventricle has no regional  wall motion abnormalities. Left ventricular diastolic parameters were  normal.   2. Right ventricular systolic function is normal. The right ventricular  size is normal.   3. The mitral valve is degenerative. Trivial mitral valve regurgitation.  No evidence of mitral stenosis.   4. The aortic valve was not well visualized. Aortic valve regurgitation  is not visualized. No aortic stenosis is present.    Carotid artery duplex 07/24/20:  There is a large fixed calcific plaque noted in the bilateral mid common  carotid artery. Otherwise mild diffuse heterogeneous plaque noted  bilateral ICA. No significant stenosis.  Antegrade right vertebral artery flow. Antegrade left vertebral artery  flow.  Follow up studies if clinically indicated.    Nuclear stress test 10/23/13 (as outlined 10/19/22 note by Dr. Jacinto Halim) 1. Resting EKG NSR, early repolarization, stress EKG was non-diagnostic for  ischemia. No ST-T changes of ischemia noted with pharmacologic stress testing. Stress symptoms included lightheadedness. Stress terminated due to completion of protocol. 2. The perfusion study demonstrated normal isotope uptake both at rest and stress. There was no evidence of ischemia or scar. Dynamic gated images reveal normal wall motion and endocardial thickening. Left ventricular  ejection fraction was estimated to be 63%.     Cardiac cath 11/18/04: 1.  Left main:  Mild to moderate calcification.  No significant obstructive      disease.  2.  LAD:  Mild to moderate calcification.  Ostial 20% stenosis with mild mid      and distal luminal irregularities.  3.  First diagonal:  Small vessel.  No significant obstructive disease.  4.  Second diagonal is moderate size vessel with an ostial 40 to 50%      stenosis.  5.  Left circumflex:  Nondominant with mild luminal irregularities.  6.  OM I:  Large vessel with mild luminal irregularities.  7.  RCA:  Dominant with mild to moderate diffuse calcification, proximal 40      to 50% stenosis followed by mid 30 to 40% stenosis and distal 50 to 60%      stenosis.  PDA and PLV have no significant disease.  8.  LV:  EF is 55 to 60%.  No wall motion abnormalities.  LVEDP was 17 mmHg.  9.  Right femoral:  Mild to moderate luminal irregularities.  No significant      obstructive disease.    IMPRESSION:  1.  Nonobstructive coronaries as described. [Mild to moderate in LM, LAD, RCA; luminal irregularities in LCX]   2.  Preserved left ventricular systolic function with an ejection fraction      of 55 to 60% and no wall motion abnormalities.    Past Medical History:  Diagnosis Date   Cancer (HCC)    throat   Cigarette smoker    COPD (chronic obstructive pulmonary disease) (HCC)    Coronary artery disease    Diabetes mellitus    type 1  x 50 yrs   Diabetic retinopathy (HCC)    Dyslipidemia    Hypertension    Myocardial infarction [facility])    "a long time ago" from insulin pump   Peripheral vascular disease (HCC)     Past Surgical History:  Procedure Laterality Date   ABDOMINAL ANGIOGRAM N/A 05/29/2014   Procedure: ABDOMINAL ANGIOGRAM;  Surgeon: Pamella Pert, MD;  Location: [facility] CATH LAB;  Service: Cardiovascular;  Laterality: N/A;   ABDOMINAL AORTOGRAM W/LOWER EXTREMITY N/A 11/14/2019   Procedure: ABDOMINAL AORTOGRAM  W/LOWER EXTREMITY;  Surgeon: Nada Libman, MD;  [facility] INVASIVE CV LAB;  Service: Cardiovascular;  Laterality: N/A;   AMPUTATION Left 10/25/2015   Procedure: Left Foot 5th Ray Amputation;  Surgeon: Nadara Mustard, MD;  Location: [facility] OR;  Service: Orthopedics;  Laterality: Left;   AMPUTATION Left 12/06/2015   Procedure: AMPUTATION BELOW KNEE;  Surgeon: Nadara Mustard, MD;  Location: [facility] OR;  Service: Orthopedics;  Laterality: Left;   CARDIAC CATHETERIZATION     EF is 55-60% and no wall motion abnormalities (long long time ago)   DIRECT LARYNGOSCOPY N/A 12/10/2022   Procedure: DIRECT LARYNGOSCOPY WITH BIOPSY;  Surgeon: Serena Colonel, MD;  Location: [facility] OR;  Service: ENT;  Laterality: N/A;   FEMORAL-POPLITEAL BYPASS GRAFT Left 10/24/2015   Procedure: BYPASS GRAFT FEMORAL below knee POPLITEAL ARTERY with Left Saphenous Vein;  Surgeon: Nada Libman, MD;  [facility] OR;  Service: Vascular;  Laterality: Left;   FEMORAL-POPLITEAL BYPASS GRAFT Right 11/15/2019   Procedure: BYPASS GRAFT FEMORAL-POPLITEAL ARTERY using Right Leg Greater Saphenous Vein;  Surgeon: Nada Libman, MD;  Location: [facility] OR;  Service: Vascular;  Laterality: Right;   FRACTURE SURGERY     left arm "many yrs ago"   LOWER EXTREMITY ANGIOGRAM N/A 01/23/2014   Procedure: LOWER EXTREMITY ANGIOGRAM;  Surgeon: Pamella Pert, MD;  Location: [facility] CATH LAB;  Service: Cardiovascular;  Laterality: N/A;   LOWER EXTREMITY ANGIOGRAM N/A 07/31/2014   Procedure: LOWER EXTREMITY ANGIOGRAM;  Surgeon: Pamella Pert, MD;  Location: [facility] CATH LAB;  Service: Cardiovascular;  Laterality: N/A;   LOWER EXTREMITY ANGIOGRAM Left 10/11/2015   Procedure: Lower Extremity Angiogram;  Surgeon: Nada Libman, MD;  Location: [facility] INVASIVE CV LAB;  Service: Cardiovascular;  Laterality: Left;   PERIPHERAL VASCULAR BALLOON ANGIOPLASTY  11/14/2019   Procedure: PERIPHERAL VASCULAR BALLOON ANGIOPLASTY;  Surgeon: Nada Libman, MD;  [facility] CV LAB;   Service: Cardiovascular;;   PERIPHERAL VASCULAR CATHETERIZATION Left 10/11/2015   Procedure: Peripheral Vascular Balloon Angioplasty;  Surgeon: Nada Libman, MD;  [facility] INVASIVE CV LAB;  Service: Cardiovascular;  Laterality: Left;  sfa failed unable to cross occluded sfa   PERIPHERAL VASCULAR CATHETERIZATION N/A 10/11/2015   Procedure: Abdominal Aortogram;  Surgeon: Nada Libman, MD;  [facility] INVASIVE CV LAB;  Service: Cardiovascular;  Laterality: N/A;   STUMP REVISION Left 03/02/2016   Procedure: Revision Left Below Knee Amputation;  Surgeon: Nadara Mustard, MD;  Location[facility] OR;  Service: Orthopedics;  Laterality: Left;   TRACHEOSTOMY TUBE PLACEMENT N/A 12/10/2022   Procedure: TRACHEOSTOMY UNDER LOCAL;  Surgeon: Serena Colonel, MD;  Location: [facility] OR;  Service: ENT;  Laterality: N/A;    MEDICATIONS:  acetaminophen (TYLENOL) 325 MG tablet   albuterol (VENTOLIN HFA) 108 (90 Base) MCG/ACT inhaler   aspirin EC 81 MG tablet   baclofen (LIORESAL) 20 MG tablet   calcium carbonate (TUMS - DOSED IN MG ELEMENTAL CALCIUM) 500 MG chewable tablet   folic acid (FOLVITE) 800 MCG tablet   HUMALOG 100 UNIT/ML injection   ibuprofen (ADVIL) 200 MG tablet   ipratropium-albuterol (DUONEB) 0.5-2.5 (3) MG/3ML SOLN   metoprolol succinate (TOPROL-XL) 25 MG 24 hr tablet   mometasone-formoterol (DULERA) 200-5 MCG/ACT AERO   Multiple Vitamins-Minerals (MULTIVITAMIN GUMMIES ADULT PO)   ramipril (ALTACE) 5 MG capsule   rosuvastatin (CRESTOR) 10 MG tablet   sertraline (ZOLOFT) 50 MG tablet   No current facility-administered medications for this encounter.    Shonna Chock, PA-C Surgical Short Stay/Anesthesiology [facility] Phone [phone] [facility] Phone [phone] 02/02/2023 6:12 PM

## 2023-02-02 NOTE — Anesthesia Preprocedure Evaluation (Addendum)
Anesthesia Evaluation  Patient identified by MRN, date of birth, ID band Patient awake    Reviewed: Allergy & Precautions, NPO status , Patient's Chart, lab work & pertinent test results, reviewed documented beta blocker date and time   History of Anesthesia Complications Negative for: history of anesthetic complications  Airway Mallampati: II  TM Distance: >3 FB Neck ROM: Full   Comment: Has trach Dental   Has trach:   Pulmonary COPD, Patient abstained from smoking., former smoker H/o awake trach for laryngeal ca   breath sounds clear to auscultation       Cardiovascular hypertension, Pt. on medications and Pt. on home beta blockers (-) angina + CAD and + Peripheral Vascular Disease   Rhythm:Regular Rate:Normal  11/27/2022 ECHO; EF >75%, hyperdynamic LVF, RVF normal, degenerative MV with trivial MR   Neuro/Psych   Anxiety     negative neurological ROS     GI/Hepatic negative GI ROS, Neg liver ROS,,,  Endo/Other  diabetes (glu 64: glucose given, repeat 166), Insulin Dependent    Renal/GU negative Renal ROS     Musculoskeletal   Abdominal   Peds  Hematology negative hematology ROS (+)   Anesthesia Other Findings   Reproductive/Obstetrics                             Anesthesia Physical Anesthesia Plan  ASA: 3  Anesthesia Plan: General   Post-op Pain Management: Tylenol PO (pre-op)*   Induction: Intravenous  PONV Risk Score and Plan: 2 and Ondansetron and Dexamethasone  Airway Management Planned: Tracheostomy  Additional Equipment: Arterial line  Intra-op Plan:   Post-operative Plan: Extubation in OR  Informed Consent: I have reviewed the patients History and Physical, chart, labs and discussed the procedure including the risks, benefits and alternatives for the proposed anesthesia with the patient or authorized representative who has indicated his/her understanding and  acceptance.       Plan Discussed with: CRNA and Surgeon  Anesthesia Plan Comments: (PAT note written 02/02/2023 by Shonna Chock, PA-C.  )       Anesthesia Quick Evaluation

## 2023-02-04 ENCOUNTER — Encounter (HOSPITAL_COMMUNITY): Payer: Self-pay | Admitting: Otolaryngology

## 2023-02-05 ENCOUNTER — Inpatient Hospital Stay (HOSPITAL_COMMUNITY): Payer: Medicare Other | Admitting: Certified Registered"

## 2023-02-05 ENCOUNTER — Encounter (HOSPITAL_COMMUNITY)
Admission: RE | Disposition: A | Discharge status: Home / Self Care | Payer: Self-pay | Source: Home / Self Care | Attending: Otolaryngology

## 2023-02-05 ENCOUNTER — Other Ambulatory Visit: Payer: Self-pay

## 2023-02-05 ENCOUNTER — Inpatient Hospital Stay (HOSPITAL_COMMUNITY)
Admission: RE | Admit: 2023-02-05 | Discharge: 2023-02-17 | DRG: 011 | Disposition: A | Discharge status: Home / Self Care | Payer: Medicare Other | Attending: Otolaryngology | Admitting: Otolaryngology

## 2023-02-05 ENCOUNTER — Encounter (HOSPITAL_COMMUNITY): Payer: Self-pay | Admitting: Otolaryngology

## 2023-02-05 ENCOUNTER — Inpatient Hospital Stay (HOSPITAL_COMMUNITY): Payer: Medicare Other | Admitting: Vascular Surgery

## 2023-02-05 DIAGNOSIS — Z7982 Long term (current) use of aspirin: Secondary | ICD-10-CM

## 2023-02-05 DIAGNOSIS — E1040 Type 1 diabetes mellitus with diabetic neuropathy, unspecified: Secondary | ICD-10-CM | POA: Diagnosis present

## 2023-02-05 DIAGNOSIS — Z888 Allergy status to other drugs, medicaments and biological substances status: Secondary | ICD-10-CM

## 2023-02-05 DIAGNOSIS — J449 Chronic obstructive pulmonary disease, unspecified: Secondary | ICD-10-CM

## 2023-02-05 DIAGNOSIS — J387 Other diseases of larynx: Secondary | ICD-10-CM | POA: Diagnosis not present

## 2023-02-05 DIAGNOSIS — Z681 Body mass index (BMI) 19 or less, adult: Secondary | ICD-10-CM

## 2023-02-05 DIAGNOSIS — L7634 Postprocedural seroma of skin and subcutaneous tissue following other procedure: Secondary | ICD-10-CM | POA: Diagnosis not present

## 2023-02-05 DIAGNOSIS — E10649 Type 1 diabetes mellitus with hypoglycemia without coma: Secondary | ICD-10-CM | POA: Diagnosis not present

## 2023-02-05 DIAGNOSIS — E43 Unspecified severe protein-calorie malnutrition: Secondary | ICD-10-CM | POA: Diagnosis present

## 2023-02-05 DIAGNOSIS — C329 Malignant neoplasm of larynx, unspecified: Principal | ICD-10-CM | POA: Diagnosis present

## 2023-02-05 DIAGNOSIS — Z9002 Acquired absence of larynx: Secondary | ICD-10-CM | POA: Diagnosis not present

## 2023-02-05 DIAGNOSIS — E109 Type 1 diabetes mellitus without complications: Secondary | ICD-10-CM

## 2023-02-05 DIAGNOSIS — Z8249 Family history of ischemic heart disease and other diseases of the circulatory system: Secondary | ICD-10-CM | POA: Diagnosis not present

## 2023-02-05 DIAGNOSIS — F419 Anxiety disorder, unspecified: Secondary | ICD-10-CM | POA: Diagnosis present

## 2023-02-05 DIAGNOSIS — I2510 Atherosclerotic heart disease of native coronary artery without angina pectoris: Secondary | ICD-10-CM

## 2023-02-05 DIAGNOSIS — E119 Type 2 diabetes mellitus without complications: Secondary | ICD-10-CM | POA: Diagnosis present

## 2023-02-05 DIAGNOSIS — I959 Hypotension, unspecified: Secondary | ICD-10-CM | POA: Diagnosis present

## 2023-02-05 DIAGNOSIS — R490 Dysphonia: Secondary | ICD-10-CM | POA: Diagnosis present

## 2023-02-05 DIAGNOSIS — E785 Hyperlipidemia, unspecified: Secondary | ICD-10-CM | POA: Diagnosis present

## 2023-02-05 DIAGNOSIS — Z79899 Other long term (current) drug therapy: Secondary | ICD-10-CM | POA: Diagnosis not present

## 2023-02-05 DIAGNOSIS — Z930 Tracheostomy status: Secondary | ICD-10-CM

## 2023-02-05 DIAGNOSIS — Z794 Long term (current) use of insulin: Secondary | ICD-10-CM | POA: Diagnosis not present

## 2023-02-05 DIAGNOSIS — I10 Essential (primary) hypertension: Secondary | ICD-10-CM | POA: Diagnosis present

## 2023-02-05 DIAGNOSIS — F32A Depression, unspecified: Secondary | ICD-10-CM | POA: Diagnosis present

## 2023-02-05 DIAGNOSIS — F17210 Nicotine dependence, cigarettes, uncomplicated: Secondary | ICD-10-CM | POA: Diagnosis present

## 2023-02-05 DIAGNOSIS — Z89512 Acquired absence of left leg below knee: Secondary | ICD-10-CM | POA: Diagnosis not present

## 2023-02-05 DIAGNOSIS — S1190XA Unspecified open wound of unspecified part of neck, initial encounter: Secondary | ICD-10-CM | POA: Diagnosis not present

## 2023-02-05 DIAGNOSIS — E1051 Type 1 diabetes mellitus with diabetic peripheral angiopathy without gangrene: Secondary | ICD-10-CM | POA: Diagnosis present

## 2023-02-05 DIAGNOSIS — Z87891 Personal history of nicotine dependence: Secondary | ICD-10-CM

## 2023-02-05 DIAGNOSIS — T85528A Displacement of other gastrointestinal prosthetic devices, implants and grafts, initial encounter: Secondary | ICD-10-CM | POA: Diagnosis not present

## 2023-02-05 DIAGNOSIS — I252 Old myocardial infarction: Secondary | ICD-10-CM | POA: Diagnosis not present

## 2023-02-05 DIAGNOSIS — E1151 Type 2 diabetes mellitus with diabetic peripheral angiopathy without gangrene: Secondary | ICD-10-CM | POA: Diagnosis not present

## 2023-02-05 DIAGNOSIS — Y848 Other medical procedures as the cause of abnormal reaction of the patient, or of later complication, without mention of misadventure at the time of the procedure: Secondary | ICD-10-CM | POA: Diagnosis not present

## 2023-02-05 DIAGNOSIS — Z89422 Acquired absence of other left toe(s): Secondary | ICD-10-CM

## 2023-02-05 DIAGNOSIS — Z7951 Long term (current) use of inhaled steroids: Secondary | ICD-10-CM

## 2023-02-05 DIAGNOSIS — Z9714 Presence of artificial left leg (complete) (partial): Secondary | ICD-10-CM

## 2023-02-05 DIAGNOSIS — Z9641 Presence of insulin pump (external) (internal): Secondary | ICD-10-CM | POA: Diagnosis present

## 2023-02-05 DIAGNOSIS — J9601 Acute respiratory failure with hypoxia: Secondary | ICD-10-CM | POA: Diagnosis present

## 2023-02-05 DIAGNOSIS — E10319 Type 1 diabetes mellitus with unspecified diabetic retinopathy without macular edema: Secondary | ICD-10-CM | POA: Diagnosis present

## 2023-02-05 HISTORY — PX: LARYNGETOMY: SHX5202

## 2023-02-05 HISTORY — PX: TOTAL LARYNGECTOMY: SHX2543

## 2023-02-05 HISTORY — PX: RADICAL NECK DISSECTION: SHX2284

## 2023-02-05 LAB — GLUCOSE, CAPILLARY
Glucose-Capillary: 64 mg/dL — ABNORMAL LOW (ref 70–99)
Glucose-Capillary: 166 mg/dL — ABNORMAL HIGH (ref 70–99)
Glucose-Capillary: 178 mg/dL — ABNORMAL HIGH (ref 70–99)
Glucose-Capillary: 149 mg/dL — ABNORMAL HIGH (ref 70–99)
Glucose-Capillary: 127 mg/dL — ABNORMAL HIGH (ref 70–99)
Glucose-Capillary: 102 mg/dL — ABNORMAL HIGH (ref 70–99)
Glucose-Capillary: 166 mg/dL — ABNORMAL HIGH (ref 70–99)
Glucose-Capillary: 205 mg/dL — ABNORMAL HIGH (ref 70–99)
Glucose-Capillary: 187 mg/dL — ABNORMAL HIGH (ref 70–99)
Glucose-Capillary: 101 mg/dL — ABNORMAL HIGH (ref 70–99)
Glucose-Capillary: 166 mg/dL — ABNORMAL HIGH (ref 70–99)
Glucose-Capillary: 105 mg/dL — ABNORMAL HIGH (ref 70–99)
Glucose-Capillary: 195 mg/dL — ABNORMAL HIGH (ref 70–99)
Glucose-Capillary: 185 mg/dL — ABNORMAL HIGH (ref 70–99)
Glucose-Capillary: 180 mg/dL — ABNORMAL HIGH (ref 70–99)

## 2023-02-05 LAB — BASIC METABOLIC PANEL
Anion gap: 8 (ref 5–15)
BUN: 21 mg/dL (ref 8–23)
CO2: 22 mmol/L (ref 22–32)
Calcium: 8.3 mg/dL — ABNORMAL LOW (ref 8.9–10.3)
Chloride: 104 mmol/L (ref 98–111)
Creatinine, Ser: 0.9 mg/dL (ref 0.61–1.24)
GFR, Estimated: >60 mL/min (ref >60)
Glucose, Bld: 179 mg/dL — ABNORMAL HIGH (ref 70–99)
Potassium: 4 mmol/L (ref 3.5–5.1)
Sodium: 134 mmol/L — ABNORMAL LOW (ref 135–145)

## 2023-02-05 LAB — MRSA NEXT GEN BY PCR, NASAL: MRSA by PCR Next Gen: NOT DETECTED

## 2023-02-05 LAB — POCT I-STAT, CHEM 8
BUN: 19 mg/dL (ref 8–23)
Calcium, Ion: 1.2 mmol/L (ref 1.15–1.40)
Chloride: 102 mmol/L (ref 98–111)
Creatinine, Ser: 0.7 mg/dL (ref 0.61–1.24)
Glucose, Bld: 214 mg/dL — ABNORMAL HIGH (ref 70–99)
HCT: 26 % — ABNORMAL LOW (ref 39.0–52.0)
Hemoglobin: 8.8 g/dL — ABNORMAL LOW (ref 13.0–17.0)
Potassium: 4.7 mmol/L (ref 3.5–5.1)
Sodium: 135 mmol/L (ref 135–145)
TCO2: 26 mmol/L (ref 22–32)

## 2023-02-05 SURGERY — LARYNGECTOMY
Anesthesia: General

## 2023-02-05 MED ORDER — ORAL CARE MOUTH RINSE
15.0000 mL | OROMUCOSAL | Status: DC
  Administered 2023-02-05 – 2023-02-17 (×34): 15 mL via OROMUCOSAL

## 2023-02-05 MED ORDER — LACTATED RINGERS IV SOLN: INTRAVENOUS | Status: DC

## 2023-02-05 MED ORDER — CHLORHEXIDINE GLUCONATE 0.12 % MT SOLN
15.0000 mL | Freq: Once | OROMUCOSAL | Status: AC
  Administered 2023-02-05: 15 mL via OROMUCOSAL
  Filled 2023-02-05: qty 15

## 2023-02-05 MED ORDER — LACTATED RINGERS IV SOLN: INTRAVENOUS | Status: DC | PRN

## 2023-02-05 MED ORDER — LIDOCAINE 2% (20 MG/ML) 5 ML SYRINGE
INTRAMUSCULAR | Status: AC
  Filled 2023-02-05: qty 5

## 2023-02-05 MED ORDER — FENTANYL CITRATE (PF) 250 MCG/5ML IJ SOLN
INTRAMUSCULAR | Status: DC | PRN
  Administered 2023-02-05 (×3): 50 ug via INTRAVENOUS
  Administered 2023-02-05: 150 ug via INTRAVENOUS
  Administered 2023-02-05: 50 ug via INTRAVENOUS

## 2023-02-05 MED ORDER — DEXTROSE 50 % IV SOLN: 25.0000 mL | Freq: Once | INTRAVENOUS | Status: DC

## 2023-02-05 MED ORDER — ARFORMOTEROL TARTRATE 15 MCG/2ML IN NEBU
15.0000 ug | INHALATION_SOLUTION | Freq: Two times a day (BID) | RESPIRATORY_TRACT | Status: DC
  Administered 2023-02-05 – 2023-02-17 (×24): 15 ug via RESPIRATORY_TRACT
  Filled 2023-02-05 (×24): qty 2

## 2023-02-05 MED ORDER — EPINEPHRINE HCL (NASAL) 0.1 % NA SOLN
NASAL | Status: AC
  Filled 2023-02-05: qty 30

## 2023-02-05 MED ORDER — BACITRACIN ZINC 500 UNIT/GM EX OINT
1.0000 | TOPICAL_OINTMENT | Freq: Three times a day (TID) | CUTANEOUS | Status: DC
  Administered 2023-02-05 – 2023-02-17 (×35): 1 via TOPICAL
  Filled 2023-02-05: qty 28.35
  Filled 2023-02-05 (×2): qty 28.4

## 2023-02-05 MED ORDER — JEVITY 1.2 CAL PO LIQD
1000.0000 mL | ORAL | Status: DC
  Administered 2023-02-06 – 2023-02-16 (×15): 1000 mL
  Filled 2023-02-05 (×21): qty 1000

## 2023-02-05 MED ORDER — MIDAZOLAM HCL 2 MG/2ML IJ SOLN: 0.5000 mg | Freq: Once | INTRAMUSCULAR | Status: DC | PRN

## 2023-02-05 MED ORDER — PROPOFOL 1000 MG/100ML IV EMUL
INTRAVENOUS | Status: AC
  Filled 2023-02-05: qty 100

## 2023-02-05 MED ORDER — OXYCODONE HCL 5 MG/5ML PO SOLN: 5.0000 mg | Freq: Once | ORAL | Status: DC | PRN

## 2023-02-05 MED ORDER — SUCCINYLCHOLINE CHLORIDE 200 MG/10ML IV SOSY
PREFILLED_SYRINGE | INTRAVENOUS | Status: AC
  Filled 2023-02-05: qty 10

## 2023-02-05 MED ORDER — IPRATROPIUM-ALBUTEROL 0.5-2.5 (3) MG/3ML IN SOLN
3.0000 mL | Freq: Two times a day (BID) | RESPIRATORY_TRACT | Status: DC | PRN
  Administered 2023-02-16: 3 mL via RESPIRATORY_TRACT
  Filled 2023-02-05: qty 3

## 2023-02-05 MED ORDER — EPHEDRINE SULFATE-NACL 50-0.9 MG/10ML-% IV SOSY
PREFILLED_SYRINGE | INTRAVENOUS | Status: DC | PRN
  Administered 2023-02-05 (×2): 5 mg via INTRAVENOUS

## 2023-02-05 MED ORDER — SODIUM CHLORIDE 0.9 % IV SOLN: INTRAVENOUS | Status: DC | PRN

## 2023-02-05 MED ORDER — ONDANSETRON HCL 4 MG/2ML IJ SOLN: 4.0000 mg | INTRAMUSCULAR | Status: DC | PRN

## 2023-02-05 MED ORDER — FENTANYL CITRATE (PF) 250 MCG/5ML IJ SOLN
INTRAMUSCULAR | Status: AC
  Filled 2023-02-05: qty 5

## 2023-02-05 MED ORDER — CHLORHEXIDINE GLUCONATE CLOTH 2 % EX PADS
6.0000 | MEDICATED_PAD | Freq: Every day | CUTANEOUS | Status: DC
  Administered 2023-02-05 – 2023-02-17 (×10): 6 via TOPICAL

## 2023-02-05 MED ORDER — ALBUTEROL SULFATE HFA 108 (90 BASE) MCG/ACT IN AERS: 1.0000 | INHALATION_SPRAY | Freq: Four times a day (QID) | RESPIRATORY_TRACT | Status: DC | PRN

## 2023-02-05 MED ORDER — OXYCODONE HCL 5 MG PO TABS: 5.0000 mg | ORAL_TABLET | Freq: Once | ORAL | Status: DC | PRN

## 2023-02-05 MED ORDER — DEXAMETHASONE SODIUM PHOSPHATE 10 MG/ML IJ SOLN
INTRAMUSCULAR | Status: AC
  Filled 2023-02-05: qty 1

## 2023-02-05 MED ORDER — INSULIN DETEMIR 100 UNIT/ML ~~LOC~~ SOLN
5.0000 [IU] | Freq: Two times a day (BID) | SUBCUTANEOUS | Status: DC
  Administered 2023-02-05 – 2023-02-06 (×2): 5 [IU] via SUBCUTANEOUS
  Filled 2023-02-05 (×3): qty 0.05

## 2023-02-05 MED ORDER — ROCURONIUM BROMIDE 10 MG/ML (PF) SYRINGE
PREFILLED_SYRINGE | INTRAVENOUS | Status: AC
  Filled 2023-02-05: qty 10

## 2023-02-05 MED ORDER — INSULIN ASPART 100 UNIT/ML IJ SOLN: 0.0000 [IU] | INTRAMUSCULAR | Status: DC

## 2023-02-05 MED ORDER — KCL-LACTATED RINGERS-D5W 20 MEQ/L IV SOLN
INTRAVENOUS | Status: DC
  Filled 2023-02-05: qty 1000

## 2023-02-05 MED ORDER — PHENYLEPHRINE HCL-NACL 20-0.9 MG/250ML-% IV SOLN
INTRAVENOUS | Status: DC | PRN
  Administered 2023-02-05: 15 ug/min via INTRAVENOUS

## 2023-02-05 MED ORDER — BACITRACIN ZINC 500 UNIT/GM EX OINT
TOPICAL_OINTMENT | CUTANEOUS | Status: AC
  Filled 2023-02-05: qty 28.35

## 2023-02-05 MED ORDER — SUCCINYLCHOLINE CHLORIDE 200 MG/10ML IV SOSY
PREFILLED_SYRINGE | INTRAVENOUS | Status: DC | PRN
  Administered 2023-02-05: 120 mg via INTRAVENOUS

## 2023-02-05 MED ORDER — ONDANSETRON HCL 4 MG/2ML IJ SOLN
INTRAMUSCULAR | Status: AC
  Filled 2023-02-05: qty 2

## 2023-02-05 MED ORDER — MOMETASONE FURO-FORMOTEROL FUM 200-5 MCG/ACT IN AERO
2.0000 | INHALATION_SPRAY | Freq: Two times a day (BID) | RESPIRATORY_TRACT | Status: DC
  Filled 2023-02-05: qty 8.8

## 2023-02-05 MED ORDER — INSULIN ASPART 100 UNIT/ML IJ SOLN
1.0000 [IU] | INTRAMUSCULAR | Status: DC
  Administered 2023-02-06: 1 [IU] via SUBCUTANEOUS
  Administered 2023-02-06 (×2): 3 [IU] via SUBCUTANEOUS

## 2023-02-05 MED ORDER — DEXTROSE 50 % IV SOLN
INTRAVENOUS | Status: AC
  Administered 2023-02-05: 12.5 g via INTRAVENOUS
  Filled 2023-02-05: qty 50

## 2023-02-05 MED ORDER — SODIUM CHLORIDE 0.9 % IV SOLN
3.0000 g | INTRAVENOUS | Status: AC
  Administered 2023-02-05: 3 g via INTRAVENOUS
  Filled 2023-02-05: qty 8

## 2023-02-05 MED ORDER — EPHEDRINE 5 MG/ML INJ
INTRAVENOUS | Status: AC
  Filled 2023-02-05: qty 5

## 2023-02-05 MED ORDER — SUGAMMADEX SODIUM 200 MG/2ML IV SOLN
INTRAVENOUS | Status: DC | PRN
  Administered 2023-02-05: 200 mg via INTRAVENOUS

## 2023-02-05 MED ORDER — PROPOFOL 10 MG/ML IV BOLUS
INTRAVENOUS | Status: DC | PRN
  Administered 2023-02-05: 130 mg via INTRAVENOUS

## 2023-02-05 MED ORDER — MORPHINE SULFATE (PF) 2 MG/ML IV SOLN
2.0000 mg | INTRAVENOUS | Status: DC | PRN
  Administered 2023-02-05 – 2023-02-08 (×11): 2 mg via INTRAVENOUS
  Filled 2023-02-05 (×11): qty 1

## 2023-02-05 MED ORDER — PROPOFOL 500 MG/50ML IV EMUL
INTRAVENOUS | Status: DC | PRN
  Administered 2023-02-05: 50 ug/kg/min via INTRAVENOUS

## 2023-02-05 MED ORDER — ORAL CARE MOUTH RINSE: 15.0000 mL | Freq: Once | OROMUCOSAL | Status: AC

## 2023-02-05 MED ORDER — PHENYLEPHRINE 80 MCG/ML (10ML) SYRINGE FOR IV PUSH (FOR BLOOD PRESSURE SUPPORT)
PREFILLED_SYRINGE | INTRAVENOUS | Status: DC | PRN
  Administered 2023-02-05: 80 ug via INTRAVENOUS

## 2023-02-05 MED ORDER — PROSOURCE TF20 ENFIT COMPATIBL EN LIQD
60.0000 mL | Freq: Every day | ENTERAL | Status: DC
  Administered 2023-02-06 – 2023-02-16 (×10): 60 mL
  Filled 2023-02-05 (×10): qty 60

## 2023-02-05 MED ORDER — LIDOCAINE 2% (20 MG/ML) 5 ML SYRINGE
INTRAMUSCULAR | Status: DC | PRN
  Administered 2023-02-05: 40 mg via INTRAVENOUS

## 2023-02-05 MED ORDER — ROCURONIUM BROMIDE 10 MG/ML (PF) SYRINGE
PREFILLED_SYRINGE | INTRAVENOUS | Status: DC | PRN
  Administered 2023-02-05: 50 mg via INTRAVENOUS
  Administered 2023-02-05 (×2): 20 mg via INTRAVENOUS
  Administered 2023-02-05: 10 mg via INTRAVENOUS

## 2023-02-05 MED ORDER — POTASSIUM CHLORIDE 2 MEQ/ML IV SOLN
INTRAVENOUS | Status: DC
  Filled 2023-02-05: qty 1000

## 2023-02-05 MED ORDER — ONDANSETRON HCL 4 MG/2ML IJ SOLN
INTRAMUSCULAR | Status: DC | PRN
  Administered 2023-02-05: 4 mg via INTRAVENOUS

## 2023-02-05 MED ORDER — INSULIN REGULAR(HUMAN) IN NACL 100-0.9 UT/100ML-% IV SOLN
INTRAVENOUS | Status: DC | PRN
  Administered 2023-02-05: 4.6 [IU]/h via INTRAVENOUS

## 2023-02-05 MED ORDER — ORAL CARE MOUTH RINSE: 15.0000 mL | OROMUCOSAL | Status: DC | PRN

## 2023-02-05 MED ORDER — BUDESONIDE 0.5 MG/2ML IN SUSP
0.5000 mg | Freq: Two times a day (BID) | RESPIRATORY_TRACT | Status: DC
  Administered 2023-02-05 – 2023-02-17 (×24): 0.5 mg via RESPIRATORY_TRACT
  Filled 2023-02-05 (×24): qty 2

## 2023-02-05 MED ORDER — HYDROMORPHONE HCL 1 MG/ML IJ SOLN
INTRAMUSCULAR | Status: AC
  Filled 2023-02-05: qty 1

## 2023-02-05 MED ORDER — MEPERIDINE HCL 25 MG/ML IJ SOLN: 6.2500 mg | INTRAMUSCULAR | Status: DC | PRN

## 2023-02-05 MED ORDER — INSULIN REGULAR(HUMAN) IN NACL 100-0.9 UT/100ML-% IV SOLN
INTRAVENOUS | Status: DC
  Administered 2023-02-05: 4 [IU]/h via INTRAVENOUS
  Filled 2023-02-05: qty 100

## 2023-02-05 MED ORDER — HYDROMORPHONE HCL 1 MG/ML IJ SOLN
0.2500 mg | INTRAMUSCULAR | Status: DC | PRN
  Administered 2023-02-05: 0.25 mg via INTRAVENOUS

## 2023-02-05 MED ORDER — DEXTROSE 50 % IV SOLN: 12.5000 g | Freq: Once | INTRAVENOUS | Status: AC

## 2023-02-05 MED ORDER — DEXTROSE 50 % IV SOLN: 0.0000 mL | INTRAVENOUS | Status: DC | PRN

## 2023-02-05 MED ORDER — PROMETHAZINE HCL 25 MG/ML IJ SOLN: 6.2500 mg | INTRAMUSCULAR | Status: DC | PRN

## 2023-02-05 MED ORDER — ACETAMINOPHEN 500 MG PO TABS
1000.0000 mg | ORAL_TABLET | Freq: Once | ORAL | Status: AC
  Administered 2023-02-05: 1000 mg via ORAL
  Filled 2023-02-05: qty 2

## 2023-02-05 MED ORDER — DEXAMETHASONE SODIUM PHOSPHATE 10 MG/ML IJ SOLN
INTRAMUSCULAR | Status: DC | PRN
  Administered 2023-02-05: 5 mg via INTRAVENOUS

## 2023-02-05 MED ORDER — LIDOCAINE-EPINEPHRINE 1 %-1:100000 IJ SOLN
INTRAMUSCULAR | Status: AC
  Filled 2023-02-05: qty 1

## 2023-02-05 MED ORDER — SODIUM CHLORIDE 0.9 % IV SOLN
3.0000 g | Freq: Four times a day (QID) | INTRAVENOUS | Status: AC
  Administered 2023-02-05: 3 g via INTRAVENOUS
  Filled 2023-02-05: qty 8

## 2023-02-05 MED ORDER — PROPOFOL 10 MG/ML IV BOLUS
INTRAVENOUS | Status: AC
  Filled 2023-02-05: qty 20

## 2023-02-05 SURGICAL SUPPLY — 69 items
APPLIER CLIP 9.375 SM OPEN (CLIP)
APR CLP SM 9.3 20 MLT OPN (CLIP)
BAG COUNTER SPONGE SURGICOUNT (BAG) ×3 IMPLANT
BAG SPNG CNTER NS LX DISP (BAG) ×2
BLADE SURG 15 STRL LF DISP TIS (BLADE) IMPLANT
BLADE SURG 15 STRL SS (BLADE)
CANISTER SUCT 3000ML PPV (MISCELLANEOUS) ×3 IMPLANT
CLEANER TIP ELECTROSURG 2X2 (MISCELLANEOUS) ×3 IMPLANT
CLIP APPLIE 9.375 SM OPEN (CLIP) IMPLANT
CNTNR URN SCR LID CUP LEK RST (MISCELLANEOUS) IMPLANT
CONNECTOR BLAKE 2:1 CARIO BLK (MISCELLANEOUS) IMPLANT
CONT SPEC 4OZ STRL OR WHT (MISCELLANEOUS)
CORD BIPOLAR FORCEPS 12FT (ELECTRODE) ×3 IMPLANT
COVER SURGICAL LIGHT HANDLE (MISCELLANEOUS) ×3 IMPLANT
DRAIN CHANNEL 15F RND FF W/TCR (WOUND CARE) IMPLANT
DRAIN JP 10F RND RADIO (DRAIN) IMPLANT
DRAIN JP 15F RND RADIO PRF (DRAIN) IMPLANT
DRAIN JP 15F RND TROCAR (DRAIN) IMPLANT
DRAIN RELI 100 BL SUC LF ST (DRAIN) ×4
DRAPE HALF SHEET 40X57 (DRAPES) IMPLANT
DRAPE INCISE 23X17 STRL (DRAPES) IMPLANT
DRAPE INCISE IOBAN 23X17 STRL (DRAPES) IMPLANT
ELECT COATED BLADE 2.86 ST (ELECTRODE) ×3 IMPLANT
ELECT REM PT RETURN 9FT ADLT (ELECTROSURGICAL) ×2
ELECTRODE REM PT RTRN 9FT ADLT (ELECTROSURGICAL) ×3 IMPLANT
EVACUATOR SILICONE 100CC (DRAIN) ×3 IMPLANT
FORCEPS BIPOLAR SPETZLER 8 1.0 (NEUROSURGERY SUPPLIES) ×3 IMPLANT
GAUZE 4X4 16PLY ~~LOC~~+RFID DBL (SPONGE) ×3 IMPLANT
GLOVE BIO SURGEON STRL SZ 6.5 (GLOVE) ×3 IMPLANT
GLOVE ECLIPSE 7.5 STRL STRAW (GLOVE) ×3 IMPLANT
GOWN STRL REUS W/ TWL LRG LVL3 (GOWN DISPOSABLE) ×6 IMPLANT
GOWN STRL REUS W/TWL LRG LVL3 (GOWN DISPOSABLE) ×4
KIT BASIN OR (CUSTOM PROCEDURE TRAY) ×3 IMPLANT
KIT LARYNGECTOMY PROVOX LPK 9 (KITS) ×3 IMPLANT
KIT TURNOVER KIT B (KITS) ×3 IMPLANT
LOCATOR NERVE 3 VOLT (DISPOSABLE) IMPLANT
NDL PRECISIONGLIDE 27X1.5 (NEEDLE) ×3 IMPLANT
NEEDLE PRECISIONGLIDE 27X1.5 (NEEDLE) ×2 IMPLANT
NS IRRIG 1000ML POUR BTL (IV SOLUTION) ×3 IMPLANT
PAD ARMBOARD 7.5X6 YLW CONV (MISCELLANEOUS) ×6 IMPLANT
PENCIL FOOT CONTROL (ELECTRODE) ×3 IMPLANT
SHEARS HARMONIC 9CM CVD (BLADE) ×3 IMPLANT
SPECIMEN JAR MEDIUM (MISCELLANEOUS) IMPLANT
SPONGE INTESTINAL PEANUT (DISPOSABLE) IMPLANT
SPONGE T-LAP 18X18 ~~LOC~~+RFID (SPONGE) ×3 IMPLANT
STAPLER VISISTAT 35W (STAPLE) ×3 IMPLANT
SUT CHROMIC 3 0 SH 27 (SUTURE) ×9 IMPLANT
SUT CHROMIC 5 0 P 3 (SUTURE) IMPLANT
SUT ETHILON 3 0 PS 1 (SUTURE) ×3 IMPLANT
SUT ETHILON 5 0 PS 2 18 (SUTURE) IMPLANT
SUT MNCRL AB 4-0 PS2 18 (SUTURE) IMPLANT
SUT MON AB 3-0 SH 27 (SUTURE) ×8
SUT MON AB 3-0 SH27 (SUTURE) IMPLANT
SUT SILK 2 0 REEL (SUTURE) IMPLANT
SUT SILK 2 0 SH CR/8 (SUTURE) ×3 IMPLANT
SUT SILK 3 0 REEL (SUTURE) IMPLANT
SUT SILK 3 0 SH CR/8 (SUTURE) ×3 IMPLANT
SUT SILK 4 0 REEL (SUTURE) ×3 IMPLANT
SUT VIC AB 3-0 FS2 27 (SUTURE) IMPLANT
SUT VIC AB 3-0 SH 18 (SUTURE) IMPLANT
SYR BULB IRRIG 60ML STRL (SYRINGE) IMPLANT
TOWEL GREEN STERILE FF (TOWEL DISPOSABLE) ×3 IMPLANT
TRAY ENT MC OR (CUSTOM PROCEDURE TRAY) ×3 IMPLANT
TRAY FOLEY MTR SLVR 14FR STAT (SET/KITS/TRAYS/PACK) IMPLANT
TUBE 10FR 43IN 5G WT TIP ENFIT (TUBING) IMPLANT
TUBE TRACH 6.0 CUFF FLEX (MISCELLANEOUS) IMPLANT
TUBE TRACH FLEX 8.0 CUFF (MISCELLANEOUS)
TUBE TRACH FLEX 8.5 CUFF (MISCELLANEOUS) IMPLANT
WATER STERILE IRR 1000ML POUR (IV SOLUTION) IMPLANT

## 2023-02-05 NOTE — Anesthesia Procedure Notes (Signed)
Arterial Line Insertion Start/End6/14/2024 7:52 AM Performed by: Nils Pyle, CRNA  Patient location: Pre-op. Preanesthetic checklist: patient identified, IV checked, site marked, risks and benefits discussed, surgical consent, monitors and equipment checked, pre-op evaluation and anesthesia consent Lidocaine 1% used for infiltration Left, radial was placed Catheter size: 20 G Hand hygiene performed  and maximum sterile barriers used   Attempts: 1 Procedure performed without using ultrasound guided technique. Following insertion, dressing applied and Biopatch. Post procedure assessment: normal and unchanged  Patient tolerated the procedure well with no immediate complications.

## 2023-02-05 NOTE — Consult Note (Signed)
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 PCCM was consulted to assist with post-op medical management.   Pertinent  Medical History   Past Medical History:  Diagnosis Date   Cancer (HCC)    throat   Cigarette smoker    COPD (chronic obstructive pulmonary disease) (HCC)    Coronary artery disease    Diabetes mellitus    type 1  x 50 yrs   Diabetic retinopathy (HCC)    Dyslipidemia    Hypertension    Myocardial infarction [facility])    "a long time ago" from insulin pump   Peripheral vascular disease (HCC)      Significant Hospital Events: Including procedures, antibiotic start and stop dates in addition to other pertinent events   6/14 Laryngectomy, PCCM consulted for medical management   Interim History / Subjective:    Objective   Blood pressure (!) 115/36, pulse 65, temperature 97.8 F (36.6 C), temperature source Oral, resp. rate 19, height 5\' 11"  (1.803 m), weight 59.7 kg, SpO2 98 %.        Intake/Output Summary (Last 24 hours) at 02/05/2023 1529 Last data filed at 02/05/2023 1500 Gross per 24 hour  Intake 2887.36 ml  Output 410 ml  Net 2477.36 ml   Filed Weights   02/05/23 0553 02/05/23 1330  Weight: 61.2 kg 59.7 kg    Examination: General: Chronically ill appearing adult male, lying on ICU bed  HENT: Normocephalic, Poor dentition, Pink dry M/M, surgical neck incision with JP x 2, tracheostomy stoma Lungs: CTA diminished  Cardiovascular: s1, s2 auscultated, RRR, no m/r/g  Abdomen: NG tube, BS  active  Extremities: moves all extremities, left BKA, no edema  Neuro: Droswy, follow commands, non verbal, nods appropriately to question  GU: foley intact, yellow urine   Resolved Hospital Problem list   N/a   Assessment & Plan:  Acute Respiratory Failure secondary to Squamous Cell Carcinoma Mass  s/p laryngectomy with neck dissection x 2 jp drains (02/05/23)  Trach (placed 12/10/22), last trach change 4/24 to 6 cuffless  P:  Continue to follow ENT post-op recs  Will eventually need post-op radiation  Trache care per protocol  Spo2 goal of 88-92%, utilize trache collar, wean O2 as tolerated Pulmonary hygiene   COPD Hx P: Wean supplemental oxygen to maintain SpO2 of 88-92% Continue prn Duonebs  Continue scheduled Dulera   DM Type 1  On home insulin pump P: Continue insulin gtt per hyperglycemia protocol Target 140-180 CBG q1 per ENDO tool  DM coordinator consulted, appreciate recs  HTN  Aline post op P: Restart Home Metoprolol and Ramipril on 6/15  Remove Aline   Dyslipidemia  P: Restart statin on 6/15  Severe Malnutrition BMI 18.4  P: RD consulted, and will initiate tube feeds on 6/15 via NG tube  Zofran added for nausea prn   CAD hx  PVD s/p Left BKA hx P: Supportive care  Hold aspirin for now   Best Practice (right click and "Reselect all SmartList Selections" daily)   Diet/type:  tubefeeds and NPO DVT prophylaxis: per primary  GI prophylaxis: N/A Lines: N/A Foley:  N/A Code Status:  full code Last date of multidisciplinary goals of care discussion [pending]   Labs   CBC: Recent Labs  Lab 02/01/23 1148 02/05/23 0824  WBC 7.7  --   HGB 12.3* 8.8*  HCT 36.9* 26.0*  MCV 96.9  --   PLT 327  --     Basic Metabolic Panel: Recent Labs  Lab 02/01/23 1148 02/05/23 0824  NA 135 135  K 4.7 4.7  CL 101 102  CO2 24  --   GLUCOSE 202* 214*  BUN 16 19  CREATININE 0.80 0.70  CALCIUM 9.2  --    GFR: Estimated Creatinine Clearance: 72.6  mL/min (by C-G formula based on SCr of 0.7 mg/dL). Recent Labs  Lab 02/01/23 1148  WBC 7.7    Liver Function Tests: No results for input(s): "AST", "ALT", "ALKPHOS", "BILITOT", "PROT", "ALBUMIN" in the last 168 hours. No results for input(s): "LIPASE", "AMYLASE" in the last 168 hours. No results for input(s): "AMMONIA" in the last 168 hours.  ABG    Component Value Date/Time   HCO3 26.5 12/09/2022 0607   TCO2 26 02/05/2023 0824   O2SAT 81.4 12/09/2022 0607     Coagulation Profile: No results for input(s): "INR", "PROTIME" in the last 168 hours.  Cardiac Enzymes: No results for input(s): "CKTOTAL", "CKMB", "CKMBINDEX", "TROPONINI" in the last 168 hours.  HbA1C: Hgb A1c MFr Bld  Date/Time Value Ref Range Status  12/09/2022 05:34 AM 8.0 (H) 4.8 - 5.6 % Final    Comment:    (NOTE) Pre diabetes:          5.7%-6.4%  Diabetes:              >6.4%  Glycemic control for   <7.0% adults with diabetes   11/14/2019 03:55 PM 8.0 (H) 4.8 - 5.6 % Final    Comment:    (NOTE) Pre diabetes:          5.7%-6.4% Diabetes:              >6.4% Glycemic control for   <7.0% adults with diabetes     CBG: Recent Labs  Lab 02/05/23 0925 02/05/23 1028 02/05/23 1129 02/05/23 1202 02/05/23 1329  GLUCAP 178* 149* 127* 102* 166*    Review of Systems:   Please see the history of present illness. All other systems reviewed and are negative    Past Medical History:  He,  has a past medical history of Cancer (HCC), Cigarette smoker, COPD (chronic obstructive pulmonary disease) (HCC), Coronary artery disease, Diabetes mellitus, Diabetic retinopathy (HCC), Dyslipidemia, Hypertension, Myocardial infarction (HCC), and Peripheral vascular disease (HCC).   Surgical History:   Past Surgical History:  Procedure Laterality Date   ABDOMINAL ANGIOGRAM N/A 05/29/2014   Procedure: ABDOMINAL ANGIOGRAM;  Surgeon: Pamella Pert, MD;  Location: [facility] CATH LAB;  Service: Cardiovascular;  Laterality:  N/A;   ABDOMINAL AORTOGRAM W/LOWER EXTREMITY N/A 11/14/2019   Procedure: ABDOMINAL AORTOGRAM W/LOWER EXTREMITY;  Surgeon: Nada Libman, MD;  Location: [facility] INVASIVE CV LAB;  Service: Cardiovascular;  Laterality: N/A;   AMPUTATION Left 10/25/2015   Procedure: Left Foot 5th Ray Amputation;  Surgeon: Nadara Mustard, MD;  Location: [facility] OR;  Service: Orthopedics;  Laterality: Left;   AMPUTATION Left 12/06/2015   Procedure: AMPUTATION BELOW KNEE;  Surgeon: Nadara Mustard, MD;  Location: [facility] OR;  Service: Orthopedics;  Laterality: Left;   CARDIAC CATHETERIZATION  EF is 55-60% and no wall motion abnormalities (long long time ago)   DIRECT LARYNGOSCOPY N/A 12/10/2022   Procedure: DIRECT LARYNGOSCOPY WITH BIOPSY;  Surgeon: Serena Colonel, MD;  Location: [facility] OR;  Service: ENT;  Laterality: N/A;   FEMORAL-POPLITEAL BYPASS GRAFT Left 10/24/2015   Procedure: BYPASS GRAFT FEMORAL below knee POPLITEAL ARTERY with Left Saphenous Vein;  Surgeon: Nada Libman, MD;  Location: [facility] OR;  Service: Vascular;  Laterality: Left;   FEMORAL-POPLITEAL BYPASS GRAFT Right 11/15/2019   Procedure: BYPASS GRAFT FEMORAL-POPLITEAL ARTERY using Right Leg Greater Saphenous Vein;  Surgeon: Nada Libman, MD;  Location: [facility] OR;  Service: Vascular;  Laterality: Right;   FRACTURE SURGERY     left arm "many yrs ago"   LOWER EXTREMITY ANGIOGRAM N/A 01/23/2014   Procedure: LOWER EXTREMITY ANGIOGRAM;  Surgeon: Pamella Pert, MD;  Location: [facility] CATH LAB;  Service: Cardiovascular;  Laterality: N/A;   LOWER EXTREMITY ANGIOGRAM N/A 07/31/2014   Procedure: LOWER EXTREMITY ANGIOGRAM;  Surgeon: Pamella Pert, MD;  Location: [facility] CATH LAB;  Service: Cardiovascular;  Laterality: N/A;   LOWER EXTREMITY ANGIOGRAM Left 10/11/2015   Procedure: Lower Extremity Angiogram;  Surgeon: Nada Libman, MD;  Location: [facility] INVASIVE CV LAB;  Service: Cardiovascular;  Laterality: Left;   PERIPHERAL VASCULAR BALLOON ANGIOPLASTY  11/14/2019   Procedure:  PERIPHERAL VASCULAR BALLOON ANGIOPLASTY;  Surgeon: Nada Libman, MD;  Location: [facility] INVASIVE CV LAB;  Service: Cardiovascular;;   PERIPHERAL VASCULAR CATHETERIZATION Left 10/11/2015   Procedure: Peripheral Vascular Balloon Angioplasty;  Surgeon: Nada Libman, MD;  Location: [facility] INVASIVE CV LAB;  Service: Cardiovascular;  Laterality: Left;  sfa failed unable to cross occluded sfa   PERIPHERAL VASCULAR CATHETERIZATION N/A 10/11/2015   Procedure: Abdominal Aortogram;  Surgeon: Nada Libman, MD;  Location: [facility] INVASIVE CV LAB;  Service: Cardiovascular;  Laterality: N/A;   STUMP REVISION Left 03/02/2016   Procedure: Revision Left Below Knee Amputation;  Surgeon: Nadara Mustard, MD;  Location: [facility] OR;  Service: Orthopedics;  Laterality: Left;   TOTAL LARYNGECTOMY N/A 02/05/2023   Per surgical report on 02/05/2023   TRACHEOSTOMY TUBE PLACEMENT N/A 12/10/2022   Procedure: TRACHEOSTOMY UNDER LOCAL;  Surgeon: Serena Colonel, MD;  Location: [facility] OR;  Service: ENT;  Laterality: N/A;     Social History:   reports that he has quit smoking. His smoking use included cigarettes. He has a 60.00 pack-year smoking history. He has never used smokeless tobacco. He reports current alcohol use. He reports that he does not use drugs.   Family History:  His family history includes Coronary artery disease in his mother; Other in his father.   Allergies Allergies  Allergen Reactions   Simvastatin Other (See Comments)    MYALGIAS, MUSCLE WEAKNESS      Home Medications  Prior to Admission medications   Medication Sig Start Date End Date Taking? Authorizing Provider  albuterol (VENTOLIN HFA) 108 (90 Base) MCG/ACT inhaler Inhale 1-2 puffs into the lungs every 6 (six) hours as needed. 01/11/23  Yes Parrett, Tammy S, NP  aspirin EC 81 MG tablet Take 243 mg by mouth at bedtime.   Yes [provider]  folic acid (FOLVITE) 800 MCG tablet Take 1,600 mcg by mouth at bedtime.   Yes [provider]  HUMALOG  100 UNIT/ML injection 2.1 mLs See admin instructions. Via PUMP Inject 2.1 ml into the pump when it runs out. Pt is unsure how often he fills his pump or the rate of his doses.  01/09/19  Yes [provider]  ibuprofen (ADVIL) 200 MG tablet Take 200-400 mg by mouth every 8 (eight) hours as needed (pain.).   Yes [provider]  ipratropium-albuterol (DUONEB) 0.5-2.5 (3) MG/3ML SOLN Take 3 mLs by nebulization 2 (two) times daily. Patient taking differently: Take 3 mLs by nebulization 2 (two) times daily as needed (wheezing/shortness of breath.). 12/17/22  Yes Rodolph Bong, MD  metoprolol succinate (TOPROL-XL) 25 MG 24 hr tablet Take 1 tablet (25 mg total) by mouth daily. Patient taking differently: Take 25 mg by mouth at bedtime. 02/14/13  Yes Nahser, Deloris Ping, MD  mometasone-formoterol (DULERA) 200-5 MCG/ACT AERO Inhale 2 puffs into the lungs 2 (two) times daily. Patient taking differently: Inhale 2 puffs into the lungs 2 (two) times daily as needed (respiratory issues.). 12/17/22  Yes Rodolph Bong, MD  Multiple Vitamins-Minerals (MULTIVITAMIN GUMMIES ADULT PO) Take 3 tablets by mouth at bedtime.   Yes [provider]  ramipril (ALTACE) 5 MG capsule Take 5 mg by mouth at bedtime.   Yes [provider]  rosuvastatin (CRESTOR) 10 MG tablet TAKE 1 TABLET BY MOUTH  DAILY Patient taking differently: Take 10 mg by mouth at bedtime. 06/24/20  Yes Cantwell, Celeste C, PA-C  sertraline (ZOLOFT) 50 MG tablet Take 50 mg by mouth at bedtime.   Yes [provider]  acetaminophen (TYLENOL) 325 MG tablet Take 2 tablets (650 mg total) by mouth every 6 (six) hours as needed for mild pain (or Fever >/= 101). 12/17/22   Rodolph Bong, MD  baclofen (LIORESAL) 20 MG tablet Take 20 mg by mouth daily as needed (for leg cramps). Reported on 12/05/2015 09/09/15   [provider]  calcium carbonate (TUMS - DOSED IN MG ELEMENTAL CALCIUM) 500 MG chewable tablet Chew  1,000-2,000 mg by mouth as needed for indigestion or heartburn.    [provider]     Critical care time: 35 min     CRITICAL CARE Performed by: Rochel Brome S-ACNP    Total critical care time: 35 minutes  Critical care time was exclusive of separately billable procedures and treating other patients.  Critical care was necessary to treat or prevent imminent or life-threatening deterioration.  Critical care was time spent personally by me on the following activities: development of treatment plan with patient and/or surrogate as well as nursing, discussions with consultants, evaluation of patient's response to treatment, examination of patient, obtaining history from patient or surrogate, ordering and performing treatments and interventions, ordering and review of laboratory studies, ordering and review of radiographic studies, pulse oximetry and re-evaluation of patient's condition.

## 2023-02-05 NOTE — Transfer of Care (Signed)
Immediate Anesthesia Transfer of Care Note  Patient: [name]  Procedure(s) Performed: TOTAL LARYNGECTOMY NECK DISSECTION (Bilateral)  Patient Location: PACU  Anesthesia Type:General  Level of Consciousness: awake  Airway & Oxygen Therapy: Patient Spontanous Breathing  Post-op Assessment: Report given to RN, Post -op Vital signs reviewed and stable, and Patient moving all extremities X 4  Post vital signs: Reviewed and stable  Last Vitals:  Vitals Value Taken Time  BP 131/54 02/05/23 1201  Temp    Pulse 58 02/05/23 1205  Resp 15 02/05/23 1205  SpO2 95 % 02/05/23 1205  Vitals shown include unvalidated device data.  Last Pain:  Vitals:   02/05/23 0617  TempSrc:   PainSc: 0-No pain         Complications: No notable events documented.

## 2023-02-05 NOTE — Progress Notes (Addendum)
Initial Nutrition Assessment  DOCUMENTATION CODES:   Severe malnutrition in context of chronic illness  INTERVENTION:   Plan to initiate enteral nutrition via NG tube tomorrow (02/06/23): - Start Jevity 1.2 @ 25 ml/hr and advance rate by 10 ml every 8 hours to goal rate of 65 ml/hr (1560 ml/day) - PROSource TF20 60 ml daily  Tube feeding regimen at goal rate provides 1952 kcal, 107 grams of protein, and 1259 ml of H2O.  Once tube feeds are initiated, monitor magnesium, potassium, and phosphorus every 12 hours for at least 6 occurrences, MD to replete as needed, as pt is at risk for refeeding syndrome given severe malnutrition.  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  GOAL:   Patient will meet greater than or equal to 90% of their needs  MONITOR:   Diet advancement, Labs, Weight trends, TF tolerance  REASON FOR ASSESSMENT:   Consult Enteral/tube feeding initiation and management (can start tube feeds tomorrow)  ASSESSMENT:   72 year old male who presented on 6/14 for procedure. PMH of CAD, PVD s/p L BKA, dyslipidemia, T1DM, HTN, MI, COPD, laryngeal tumor.  06/14 - s/p total laryngectomy, bilateral neck dissection, NG tube placement  Consult received to start enteral nutrition tomorrow. Discussed with RN. Per Op Note today, "nasogastric tube was placed through the left side of the nose and easily passed into the stomach." Placement confirmed in OR by ENT MD.  Spoke with pt's wife at bedside. She reports that pt's appetite has been okay at home. Pt typically eats 2 meals daily (breakfast and supper) and usually skips lunch. A typical meal may include fruit, 2 eggs, 2 pieces of bacon, and toast. Pt snacks throughout the day per family. Pt with T1DM and manages his blood sugars himself. Pt's wife is not aware of  any recent issues with this. Most recent hemoglobin A1C was 8.0 on 12/09/22.  Pt's family denies pt every complaining of difficulties chewing or swallowing or pain with eating. Pt has never had to modify the texture of his foods or thicken his liquids. Pt last seen by inpatient RD during previous admission on 12/14/22. At that time, pt reported to RD that he had throat soreness.  Pt's family reports that pt has lost about 25 lbs over the last 3 months due to decreased PO intake related to his cancer. Reviewed weight history in chart. Pt with a 6.8 kg weight loss since 11/27/22. This is a 10% weight loss in less than 3 months which is severe and significant for timeframe. Based on NFPE and weight loss, pt meets criteria for severe malnutrition. Pt is at refeeding risk.  Medications reviewed and include: SSI q 4 hours, IV abx IVF: LR with KCl 20 mEq @ 75 ml/hr  Labs reviewed: hemoglobin 8.8 CBG's: 64-178 x 12 hours  NUTRITION - FOCUSED PHYSICAL EXAM:  Flowsheet Row Most Recent Value  Orbital Region Severe depletion  Upper Arm Region Moderate depletion  Thoracic and Lumbar Region Severe depletion  Buccal Region Moderate depletion  Temple Region Mild depletion  Clavicle Bone Region Severe depletion  Clavicle and Acromion Bone Region Severe depletion  Scapular Bone Region Moderate depletion  Dorsal Hand Unable to assess  [mitts]  Patellar Region Moderate depletion  Anterior Thigh Region Severe depletion  Posterior Calf Region Moderate depletion  Edema (RD Assessment) None  Hair Reviewed  Eyes  Reviewed  Mouth Reviewed  Skin Reviewed  Nails Reviewed       Diet Order:   Diet Order             Diet NPO time specified  Diet effective now                   EDUCATION NEEDS:   Education needs have been addressed  Skin:  Skin Assessment: Reviewed RN Assessment (incision to neck)  Last BM:  no documented BM  Height:   Ht Readings from Last 1 Encounters:  02/05/23 5\' 11"   (1.803 m)    Weight:   Wt Readings from Last 1 Encounters:  02/05/23 59.7 kg    BMI:  Body mass index is 18.36 kg/m.  Estimated Nutritional Needs:   Kcal:  1900-2100  Protein:  90-110 grams  Fluid:  1.9-2.1 L    Mertie Clause, MS, RD, LDN Inpatient Clinical Dietitian Please see [facility] for contact information.

## 2023-02-05 NOTE — Anesthesia Procedure Notes (Addendum)
Date/Time: 02/05/2023 7:39 AM  Performed by: Nils Pyle, CRNAPre-anesthesia Checklist: Patient identified, Emergency Drugs available, Suction available and Patient being monitored Patient Re-evaluated:Patient Re-evaluated prior to induction Oxygen Delivery Method: Circle system utilized Preoxygenation: Pre-oxygenation with 100% oxygen Induction Type: IV induction Tube type: Reinforced Tube size: 7.0 mm Number of attempts: 1 Airway Equipment and Method: Tracheostomy Placement Confirmation: positive ETCO2 and breath sounds checked- equal and bilateral Tube secured with: Tape Dental Injury: Teeth and Oropharynx as per pre-operative assessment  Comments: Patient with existing trach site. Tracheostomy removed and 7.0 reinforced ETT passed gently through stoma.

## 2023-02-05 NOTE — Anesthesia Postprocedure Evaluation (Signed)
Anesthesia Post Note  Patient: [name]  Procedure(s) Performed: TOTAL LARYNGECTOMY NECK DISSECTION (Bilateral)     Patient location during evaluation: PACU Anesthesia Type: General Level of consciousness: awake and alert, patient cooperative and oriented Pain management: pain level controlled Vital Signs Assessment: post-procedure vital signs reviewed and stable Respiratory status: spontaneous breathing, nonlabored ventilation, respiratory function stable and patient connected to tracheostomy mask oxygen Cardiovascular status: blood pressure returned to baseline and stable Postop Assessment: no apparent nausea or vomiting Anesthetic complications: no   No notable events documented.  Last Vitals:  Vitals:   02/05/23 1215 02/05/23 1230  BP: (!) 126/50 (!) 126/53  Pulse: (!) 58 66  Resp: 19 17  Temp:    SpO2: 96% 96%    Last Pain:  Vitals:   02/05/23 0617  TempSrc:   PainSc: 0-No pain                 [place] Dani,E. Eliah Ozawa

## 2023-02-05 NOTE — H&P (Signed)
Return visit. Doing fine at home with his tracheostomy. He is able to eat and drink well. He had a PET scan yesterday. The results are not yet available. He is going to see radiation oncology and medical oncology in the next week or 2. Tracheostomy looks stable. He is able to phonate with Passy-Muir valve in place. No new masses palpable. 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 Options discussed were chemo/XRT or total laryngectomy with possible postop radiation. He will consider these options after he talks to the medical and radiation oncologist. We will arrange appropriate follow-up following that. He is going to the trach clinic sometime also next week.    Reason for Consult:stridor hoarseness Referring Physician: Dr Lance Bosch is an 72 y.o. male.  HPI: With a history of hoarseness for about 1 month.  He has had a little bit of shortness of breath a month ago and up until 2 weeks ago.  About 2 weeks ago he started having more feeling like there was some resistance to breathing.  He did note some noise that was occurring with his breathing.  He has not had any change in his voice.  He was seen in the emergency room once and given breathing treatments and thought he had a COPD exacerbation.  He is now back again with the same symptoms.  He was stridorous at the [facility] emergency room.  He has been transferred to [facility] for evaluation.  He does have smoking history       Past Medical History:  Diagnosis Date   Cigarette smoker     Coronary artery disease     Diabetes mellitus      type 1  x 50 yrs   Diabetic retinopathy     Dyslipidemia     Hypertension     Peripheral vascular disease             Past Surgical History:  Procedure Laterality Date   ABDOMINAL ANGIOGRAM N/A 05/29/2014    Procedure: ABDOMINAL ANGIOGRAM;  Surgeon: Pamella Pert, MD;  Location: [facility] CATH LAB;  Service: Cardiovascular;  Laterality: N/A;   ABDOMINAL AORTOGRAM W/LOWER  EXTREMITY N/A 11/14/2019    Procedure: ABDOMINAL AORTOGRAM W/LOWER EXTREMITY;  Surgeon: Nada Libman, MD;  Location: [facility] INVASIVE CV LAB;  Service: Cardiovascular;  Laterality: N/A;   AMPUTATION Left 10/25/2015    Procedure: Left Foot 5th Ray Amputation;  Surgeon: Nadara Mustard, MD;  Location: [facility] OR;  Service: Orthopedics;  Laterality: Left;   AMPUTATION Left 12/06/2015    Procedure: AMPUTATION BELOW KNEE;  Surgeon: Nadara Mustard, MD;  Location: [facility] OR;  Service: Orthopedics;  Laterality: Left;   CARDIAC CATHETERIZATION        EF is 55-60% and no wall motion abnormalities (long long time ago)   FEMORAL-POPLITEAL BYPASS GRAFT Left 10/24/2015    Procedure: BYPASS GRAFT FEMORAL below knee POPLITEAL ARTERY with Left Saphenous Vein;  Surgeon: Nada Libman, MD;  Location: [facility] OR;  Service: Vascular;  Laterality: Left;   FEMORAL-POPLITEAL BYPASS GRAFT Right 11/15/2019    Procedure: BYPASS GRAFT FEMORAL-POPLITEAL ARTERY using Right Leg Greater Saphenous Vein;  Surgeon: Nada Libman, MD;  Location: [facility] OR;  Service: Vascular;  Laterality: Right;   FRACTURE SURGERY        left arm "many yrs ago"   LOWER EXTREMITY ANGIOGRAM N/A 01/23/2014    Procedure: LOWER EXTREMITY ANGIOGRAM;  Surgeon: Pamella Pert,  MD;  Location: [facility] CATH LAB;  Service: Cardiovascular;  Laterality: N/A;   LOWER EXTREMITY ANGIOGRAM N/A 07/31/2014    Procedure: LOWER EXTREMITY ANGIOGRAM;  Surgeon: Pamella Pert, MD;  Location: [facility] CATH LAB;  Service: Cardiovascular;  Laterality: N/A;   LOWER EXTREMITY ANGIOGRAM Left 10/11/2015    Procedure: Lower Extremity Angiogram;  Surgeon: Nada Libman, MD;  Location: [facility] INVASIVE CV LAB;  Service: Cardiovascular;  Laterality: Left;   PERIPHERAL VASCULAR BALLOON ANGIOPLASTY   11/14/2019    Procedure: PERIPHERAL VASCULAR BALLOON ANGIOPLASTY;  Surgeon: Nada Libman, MD;  Location: [facility] INVASIVE CV LAB;  Service: Cardiovascular;;   PERIPHERAL VASCULAR CATHETERIZATION Left 10/11/2015    Procedure:  Peripheral Vascular Balloon Angioplasty;  Surgeon: Nada Libman, MD;  Location: [facility] INVASIVE CV LAB;  Service: Cardiovascular;  Laterality: Left;  sfa failed unable to cross occluded sfa   PERIPHERAL VASCULAR CATHETERIZATION N/A 10/11/2015    Procedure: Abdominal Aortogram;  Surgeon: Nada Libman, MD;  Location: [facility] INVASIVE CV LAB;  Service: Cardiovascular;  Laterality: N/A;   STUMP REVISION Left 03/02/2016    Procedure: Revision Left Below Knee Amputation;  Surgeon: Nadara Mustard, MD;  Location: [facility] OR;  Service: Orthopedics;  Laterality: Left;           Family History  Problem Relation Age of Onset   Coronary artery disease Mother     Other Father          Declined after a fall      Social History:  reports that he has been smoking cigarettes. He has a 15.00 pack-year smoking history. He has never used smokeless tobacco. He reports current alcohol use. He reports that he does not use drugs.   Allergies:       Allergies  Allergen Reactions   Simvastatin Other (See Comments)      MYALGIAS, MUSCLE WEAKNESS        Medications: I have reviewed the patient's current medications.   Lab Results Last 48 Hours        Results for orders placed or performed during the hospital encounter of 12/09/22 (from the past 48 hour(s))  Hemoglobin A1c     Status: Abnormal    Collection Time: 12/09/22  5:34 AM  Result Value Ref Range    Hgb A1c MFr Bld 8.0 (H) 4.8 - 5.6 %      Comment: (NOTE) Pre diabetes:          5.7%-6.4%   Diabetes:              >6.4%   Glycemic control for   <7.0% adults with diabetes      Mean Plasma Glucose 182.9 mg/dL      Comment: Performed at [facility], [address], [place], [place] [address]  Comprehensive metabolic panel     Status: Abnormal    Collection Time: 12/09/22  5:35 AM  Result Value Ref Range    Sodium 135 135 - 145 mmol/L    Potassium 4.1 3.5 - 5.1 mmol/L    Chloride 97 (L) 98 - 111 mmol/L    CO2 28 22 - 32 mmol/L    Glucose, Bld 183  (H) 70 - 99 mg/dL      Comment: Glucose reference range applies only to samples taken after fasting for at least 8 hours.    BUN 19 8 - 23 mg/dL    Creatinine, Ser 6.04 0.61 - 1.24 mg/dL    Calcium 54.0 8.9 -  10.3 mg/dL    Total Protein 7.0 6.5 - 8.1 g/dL    Albumin 3.9 3.5 - 5.0 g/dL    AST 28 15 - 41 U/L    ALT 24 0 - 44 U/L    Alkaline Phosphatase 71 38 - 126 U/L    Total Bilirubin 0.7 0.3 - 1.2 mg/dL    GFR, Estimated >16 >10 mL/min      Comment: (NOTE) Calculated using the CKD-EPI Creatinine Equation (2021)      Anion gap 10 5 - 15      Comment: Performed at [facility], [address], [place], [place] [address]  CBC     Status: Abnormal    Collection Time: 12/09/22  5:35 AM  Result Value Ref Range    WBC 12.0 (H) 4.0 - 10.5 K/uL    RBC 4.16 (L) 4.22 - 5.81 MIL/uL    Hemoglobin 13.6 13.0 - 17.0 g/dL    HCT 40.9 81.1 - 91.4 %    MCV 95.4 80.0 - 100.0 fL    MCH 32.7 26.0 - 34.0 pg    MCHC 34.3 30.0 - 36.0 g/dL    RDW 78.2 95.6 - 21.3 %    Platelets 382 150 - 400 K/uL    nRBC 0.0 0.0 - 0.2 %      Comment: Performed at [facility], [address] [place], [place] [address]  Blood gas, venous     Status: Abnormal    Collection Time: 12/09/22  6:07 AM  Result Value Ref Range    pH, Ven 7.35 7.25 - 7.43    pCO2, Ven 48 44 - 60 mmHg    pO2, Ven 48 (H) 32 - 45 mmHg    Bicarbonate 26.5 20.0 - 28.0 mmol/L    Acid-Base Excess 0.4 0.0 - 2.0 mmol/L    O2 Saturation 81.4 %    Patient temperature 37.0        Comment: Performed at [facility], [address], [place], [place] [address]  POC CBG, ED     Status: Abnormal    Collection Time: 12/09/22  6:32 AM  Result Value Ref Range    Glucose-Capillary 192 (H) 70 - 99 mg/dL      Comment: Glucose reference range applies only to samples taken after fasting for at least 8 hours.  CBG monitoring, ED     Status: Abnormal    Collection Time: 12/09/22 12:05 PM  Result Value Ref  Range    Glucose-Capillary 296 (H) 70 - 99 mg/dL      Comment: Glucose reference range applies only to samples taken after fasting for at least 8 hours.  CBG monitoring, ED     Status: Abnormal    Collection Time: 12/09/22  2:12 PM  Result Value Ref Range    Glucose-Capillary 250 (H) 70 - 99 mg/dL      Comment: Glucose reference range applies only to samples taken after fasting for at least 8 hours.  Glucose, capillary     Status: Abnormal    Collection Time: 12/09/22  4:01 PM  Result Value Ref Range    Glucose-Capillary 239 (H) 70 - 99 mg/dL      Comment: Glucose reference range applies only to samples taken after fasting for at least 8 hours.         Imaging Results (Last 48 hours)  DG Chest Portable 1 View   Result Date: 12/09/2022 CLINICAL DATA:  72 year old male with  history of shortness of breath. EXAM: PORTABLE CHEST 1 VIEW COMPARISON:  Chest x-ray 11/27/2022. FINDINGS: Lung volumes are normal. Emphysematous changes are noted. No consolidative airspace disease. No pleural effusions. No pneumothorax. No pulmonary nodule or mass noted. Pulmonary vasculature and the cardiomediastinal silhouette are within normal limits. Atherosclerosis in the thoracic aorta. IMPRESSION: 1. No radiographic evidence of acute cardiopulmonary disease. 2. Emphysema. 3. Aortic atherosclerosis. Electronically Signed   By: Trudie Reed M.D.   On: 12/09/2022 05:57       ROS Blood pressure 135/64, pulse 85, temperature (!) 97.5 F (36.4 C), temperature source Oral, resp. rate 20, height 5\' 11"  (1.803 m), weight 58.6 kg, SpO2 99 %. Physical Exam HENT:     Head: Normocephalic.     Comments: Fiberoptic exam reveals he has a exophytic mass of the right vocal cord that appears to be extending through the entire cord but the exam is difficult with the disposable scope and his discomfort.  There is also a polyp at the anterior commissure that is slightly exophytic probably tumorous and then a more benign looking  polyp of the left vocal cord that ball-valve's into the glottis.  This does explain his stridorous activity.  Difficult to say because there is some limitation due to the anterior commissure mass effect but it does look like both vocal cords move. Eyes:     Extraocular Movements: Extraocular movements intact.     Pupils: Pupils are equal, round, and reactive to light.  Musculoskeletal:     Cervical back: Normal range of motion.  Neurological:     Mental Status: He is alert.            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  He does need a direct laryngoscopy and biopsy.  He also needs a CT scan with contrast.  If that could be ordered that would be helpful in expediting his workup.  I am rotating off the service in the morning and will pass this along to the next otolaryngologist that will be taking over for a treatment plan.

## 2023-02-05 NOTE — Progress Notes (Signed)
eLink Physician-Brief Progress Note Patient Name: [name] DOB: 1950/11/12 MRN: [id number]   Date of Service  02/05/2023  HPI/Events of Note  Patient is Type 1 diabetic who is NPO s/p laryngectomy, he was on an Insulin gtt which was turned off due to blood sugar of 105 mg / dl. He is due to be placed back on his Insulin pump in the AM.  eICU Interventions  Levemir 5 units SQ tonight + sensitive scale Humalog coverage based on Q 4 hourly blood sugar checks, pending restoration of his Insulin pump.        [name] 02/05/2023, 8:29 PM

## 2023-02-05 NOTE — Op Note (Signed)
OPERATIVE REPORT  DATE OF SURGERY: 02/05/2023  PATIENT:  [name],  72 y.o. male  PRE-OPERATIVE DIAGNOSIS:  Laryngeal cancer  POST-OPERATIVE DIAGNOSIS:  Laryngeal cancer  PROCEDURE:  Procedure(s): TOTAL LARYNGECTOMY NECK DISSECTION, bilateral  SURGEON:  Susy Frizzle, MD  ASSISTANTS: RNFA  ANESTHESIA:   General   EBL: 100 ml  DRAINS: 2 x 15 French round JP  LOCAL MEDICATIONS USED:  None  SPECIMEN: Total laryngectomy with bilateral selective neck dissection including levels 2 3 and 4, specimen oriented for pathology  COUNTS:  Correct  PROCEDURE DETAILS: The patient was taken to the operating room and placed on the operating table in the supine position. Following induction of general endotracheal anesthesia, using the tracheostomy site, replacing it with a 7 reinforced endotracheal tube that of neck was prepped and draped in a standard fashion.  A full apron incision was outlined with a marking pen about 2 fingerbreadths above the tracheostomy site.  Electrocautery was used to incise the skin and subcutaneous tissue and dissection carried down through the platysma layer.  Subplatysmal flaps were developed superiorly to above the hyoid centrally and laterally up towards the angle of the mandible.  Inferiorly the flaps were developed down to the clavicle.  The tracheostomy site was then included in the stomal incision which was created in a circular fashion.  Stay sutures were used to hold the superior neck flaps.  1.  Bilateral selective neck dissection including levels 2, 3 and 4.  The right side was dissected first followed by the left.  An accessory external jugular vein was ligated between clamps and divided.  The main external jugular was preserved as was the great auricular nerve.  Fibrofatty tissue lateral to the sternocleidomastoid muscle was dissected anteriorly and then the dissection continued medial to the sternocleidomastoid muscle.  Superiorly the fascia just below  the submandibular gland was dissected down until the digastric muscle was exposed.  Parotid tail was divided also accessing the posterior digastric.  The spinal accessory nerve was identified and dissected up to the superior limit.  The lymph node bearing tissue posterosuperior to the spinal accessory nerve was then dissected down to the fascia of the splenius capitis and levator scapula muscle.  Specimen brought into the nerve and the specimen continued dissecting anteriorly over the cutaneous branches of the cervical plexus.  The internal jugular vein and the carotid system and vagus were all identified and preserved.  Dissection continued down inferiorly.  The omohyoid was divided.  All lymph node bearing tissue was dissected off of the internal jugular vein.  Branches were ligated with 4-0 silk ties.  Specimen was then brought forward.  Similar procedure was performed on the left side.  There were multiple mildly enlarged lymph nodes identified, more so on the left side.  Hypoglossal nerve was also preserved bilaterally.  2.  Total laryngectomy.  After the neck dissections were completed the larynx was skeletonized for superiorly dissecting through all of the suprahyoid musculature.  The greater cornu of the hyoid was mobilized on both sides and the pharyngeal mucosa was then dissected off of this.  The pharyngeal constrictor muscles were then dissected off of the thyroid ala on both sides.  Again pharyngeal mucosa was dissected off of the superior medial portion of the thyroid cartilage on both sides preserving adequate amounts of mucosa.  Bilateral thyroid lobes were preserved and dissected off laterally preserving the superior vasculature.  The isthmus had previously been divided during the tracheostomy.  Strap muscles  were dissected off inferiorly to expose the trachea about 1-1/2 cm below the tracheostomy site.  Once the larynx was adequately skeletonized the tracheal stoma was created.  A new tube and  circuit were then used for ventilation going through the new tracheostomy incision.  The stoma was then matured using interrupted 3-0 Monocryl in a mattress technique.  Tracheal cut was beveled upward posteriorly.  The party wall was then divided up towards the cricoid.  The superior mucosal dissection continued and the epiglottic ligament was identified.  The pharynx was entered at the midline.  Using direct visualization mucosal cuts were created with of the piriforms preserving adequate mucosa.  The glottic mass was easily identified but there were at least 2-3 cm mucosal margins in all directions.  Postcricoid mucosa was then divided.  The specimen was delivered and oriented for pathology.  Hemostasis was completed using 4-0 silk ties and bipolar cautery as needed.  Nasogastric tube was placed through the left side of the nose and easily passed into the stomach.  This was secured in place with benzoin and tape.  The pharyngeal closure was then accomplished in 2 layers using interrupted 4-0 Monocryl everting the mucosa.  Adequacy of closure was tested with a saline filled A[place].  There is no evidence of leak.  Saline passed easily.  Secondary closure was accomplished using 3-0 chromic on the constrictor muscles and the thyroid.  The wound was then thoroughly irrigated with saline.  2 drains were exited through separate stab incision secured in place with silk suture.  The skin skin was closed in 2 layers using interrupted 3-0 chromic on the platysma and staples on the skin.  The drains were charged.  Patient was awakened extubated and transferred to recovery in stable condition.  Bacitracin was applied to the incisions.    PATIENT DISPOSITION:  To PACU, stable

## 2023-02-05 NOTE — Inpatient Diabetes Management (Signed)
Inpatient Diabetes Program Recommendations  AACE/ADA: New Consensus Statement on Inpatient Glycemic Control (2015)  Target Ranges:  Prepandial:   less than 140 mg/dL      Peak postprandial:   less than 180 mg/dL (1-2 hours)      Critically ill patients:  140 - 180 mg/dL   Lab Results  Component Value Date   GLUCAP 102 (H) 02/05/2023   HGBA1C 8.0 (H) 12/09/2022    Diabetes history: DM1  Outpatient Diabetes medications: Insulin pump Current orders for Inpatient glycemic control: IV insulin  Inpatient Diabetes Program Recommendations:   Per Loel Ro on 05/20/22: Basal: 12 am 0.9 8:30 am 0.8 units 12 pm 0.85 units 6 pm 0.65 units 7 pm 0.9 units Carb Ratio 1 unit/15 gms carb Sensitivity 50 Target 120  Will follow to see what insulin pump changes were made since 05/20/22.  Agree with currently in OR with IV insulin drip.  Will follow during hospitalization.  Thank you, Billy Fischer. Kashmir Leedy, RN, MSN, CDE  Diabetes Coordinator Inpatient Glycemic Control Team Team Pager [phone] (8am-5pm) 02/05/2023 12:25 PM

## 2023-02-06 ENCOUNTER — Encounter (HOSPITAL_COMMUNITY): Payer: Self-pay | Admitting: Otolaryngology

## 2023-02-06 DIAGNOSIS — J387 Other diseases of larynx: Secondary | ICD-10-CM | POA: Diagnosis not present

## 2023-02-06 DIAGNOSIS — Z9002 Acquired absence of larynx: Secondary | ICD-10-CM | POA: Diagnosis not present

## 2023-02-06 DIAGNOSIS — E109 Type 1 diabetes mellitus without complications: Secondary | ICD-10-CM | POA: Diagnosis not present

## 2023-02-06 LAB — GLUCOSE, CAPILLARY
Glucose-Capillary: 140 mg/dL — ABNORMAL HIGH (ref 70–99)
Glucose-Capillary: 163 mg/dL — ABNORMAL HIGH (ref 70–99)
Glucose-Capillary: 213 mg/dL — ABNORMAL HIGH (ref 70–99)
Glucose-Capillary: 254 mg/dL — ABNORMAL HIGH (ref 70–99)
Glucose-Capillary: 254 mg/dL — ABNORMAL HIGH (ref 70–99)
Glucose-Capillary: 317 mg/dL — ABNORMAL HIGH (ref 70–99)
Glucose-Capillary: 321 mg/dL — ABNORMAL HIGH (ref 70–99)
Glucose-Capillary: 66 mg/dL — ABNORMAL LOW (ref 70–99)

## 2023-02-06 LAB — CBC
HCT: 29.6 % — ABNORMAL LOW (ref 39.0–52.0)
Hemoglobin: 9.9 g/dL — ABNORMAL LOW (ref 13.0–17.0)
MCH: 32 pg (ref 26.0–34.0)
MCHC: 33.4 g/dL (ref 30.0–36.0)
MCV: 95.8 fL (ref 80.0–100.0)
Platelets: 302 10*3/uL (ref 150–400)
RBC: 3.09 MIL/uL — ABNORMAL LOW (ref 4.22–5.81)
RDW: 13.3 % (ref 11.5–15.5)
WBC: 9.5 10*3/uL (ref 4.0–10.5)
nRBC: 0 % (ref 0.0–0.2)

## 2023-02-06 LAB — BASIC METABOLIC PANEL
Anion gap: 7 (ref 5–15)
BUN: 15 mg/dL (ref 8–23)
CO2: 25 mmol/L (ref 22–32)
Calcium: 8.1 mg/dL — ABNORMAL LOW (ref 8.9–10.3)
Chloride: 102 mmol/L (ref 98–111)
Creatinine, Ser: 0.72 mg/dL (ref 0.61–1.24)
GFR, Estimated: >60 mL/min (ref >60)
Glucose, Bld: 81 mg/dL (ref 70–99)
Potassium: 3.9 mmol/L (ref 3.5–5.1)
Sodium: 134 mmol/L — ABNORMAL LOW (ref 135–145)

## 2023-02-06 LAB — MAGNESIUM
Magnesium: 1.8 mg/dL (ref 1.7–2.4)
Magnesium: 2.3 mg/dL (ref 1.7–2.4)

## 2023-02-06 LAB — PHOSPHORUS
Phosphorus: 3.4 mg/dL (ref 2.5–4.6)
Phosphorus: 3 mg/dL (ref 2.5–4.6)

## 2023-02-06 MED ORDER — MAGNESIUM SULFATE 2 GM/50ML IV SOLN
2.0000 g | Freq: Once | INTRAVENOUS | Status: AC
  Administered 2023-02-06: 2 g via INTRAVENOUS
  Filled 2023-02-06: qty 50

## 2023-02-06 MED ORDER — NICOTINE 14 MG/24HR TD PT24: 14.0000 mg | MEDICATED_PATCH | Freq: Every day | TRANSDERMAL | Status: DC

## 2023-02-06 MED ORDER — DOCUSATE SODIUM 50 MG/5ML PO LIQD
200.0000 mg | Freq: Once | ORAL | Status: AC
  Administered 2023-02-06: 200 mg
  Filled 2023-02-06: qty 20

## 2023-02-06 MED ORDER — DEXTROSE 50 % IV SOLN
INTRAVENOUS | Status: AC
  Administered 2023-02-06: 25 mL
  Filled 2023-02-06: qty 50

## 2023-02-06 MED ORDER — MELATONIN 3 MG PO TABS
3.0000 mg | ORAL_TABLET | Freq: Every day | ORAL | Status: DC
  Administered 2023-02-06 – 2023-02-16 (×10): 3 mg
  Filled 2023-02-06 (×10): qty 1

## 2023-02-06 MED ORDER — INSULIN DETEMIR 100 UNIT/ML ~~LOC~~ SOLN: 5.0000 [IU] | Freq: Every day | SUBCUTANEOUS | Status: DC

## 2023-02-06 MED ORDER — INSULIN ASPART 100 UNIT/ML IJ SOLN
0.0000 [IU] | INTRAMUSCULAR | Status: DC
  Administered 2023-02-06: 5 [IU] via SUBCUTANEOUS
  Administered 2023-02-06: 7 [IU] via SUBCUTANEOUS
  Administered 2023-02-07: 5 [IU] via SUBCUTANEOUS

## 2023-02-06 MED ORDER — INSULIN DETEMIR 100 UNIT/ML ~~LOC~~ SOLN
8.0000 [IU] | Freq: Every day | SUBCUTANEOUS | Status: DC
  Administered 2023-02-06: 8 [IU] via SUBCUTANEOUS
  Filled 2023-02-06 (×2): qty 0.08

## 2023-02-06 NOTE — Progress Notes (Signed)
Pharmacy Electrolyte Replacement  Recent Labs:  Recent Labs    02/06/23 0657  K 3.9  MG 1.8  PHOS 3.4  CREATININE 0.72    Low Critical Values (K </= 2.5, Phos </= 1, Mg </= 1) Present: None  MD Contacted: n/a  Plan: Magnesium 2g IV x1.   Link Snuffer, PharmD, BCPS, BCCCP Clinical Pharmacist Please refer to [facility] for [facility] Pharmacy numbers

## 2023-02-06 NOTE — Progress Notes (Signed)
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 PCCM was consulted to assist with post-op medical management.   Pertinent  Medical History   Past Medical History:  Diagnosis Date   Cancer (HCC)    throat   Cigarette smoker    COPD (chronic obstructive pulmonary disease) (HCC)    Coronary artery disease    Diabetes mellitus    type 1  x 50 yrs   Diabetic retinopathy (HCC)    Dyslipidemia    Hypertension    Myocardial infarction (HCC)    "a long time ago" from insulin pump   Peripheral vascular disease (HCC)      Significant Hospital Events: Including procedures, antibiotic start and stop dates in addition to other pertinent events   6/14 Laryngectomy, PCCM consulted for medical management   Interim History / Subjective:   POD1 total lary and bilat neck dissection   BMP labs pending   Objective   Blood pressure (!) 132/39, pulse 69, temperature 98.1 F (36.7 C), temperature source Oral, resp. rate (!) 21, height 5\' 11"  (1.803 m), weight 61.5 kg, SpO2 91 %.    FiO2 (%):  [28 %] 28 %   Intake/Output Summary (Last 24 hours) at 02/06/2023 1117 Last data filed at 02/06/2023 1100 Gross per 24 hour  Intake 2945.26 ml  Output 895 ml  Net 2050.26 ml   Filed Weights   02/05/23 0553 02/05/23 1330 02/06/23 0500  Weight: 61.2 kg 59.7 kg 61.5 kg    Examination: General: ill appearing thin older adult M  HENT: Jps with about 150 out. Some generalized neck swelling. Lary stoma tube inserted by ENT. NGT    Lungs:  CTAb  Cardiovascular: rr  Abdomen: thin ndnt  Extremities: no acute joint deformity  Neuro: Awake alert following commands GU: foley   Resolved Hospital Problem list   N/a   Assessment & Plan:  Laryngeal cancer s/p total laryngectomy and bilateral neck dissection 02/05/23 w Dr Pollyann Kennedy   Laryngeal cancer (T3 N0) s/p total lary (02/05/23)  Hx acute resp failure 2/2 obstructing mass in setting of above, s/p trach (11/2022)  Hx COPD  Hx tobacco abuse  DM1 HTN HLD CAD  PVD s/p L BKA  Severe protein calorie malnutrition 2/2 laryngeal ca  P:  -keep in ICU at least through the wknd -appreciate ENT recs  -routine trach care -will transition to home insulin pump 6/15, SSI in interim. Cs DM coord -restart home antiHTN and statin -hold ASA  -EN per RDN -dc foley -PT/OT/SLP       Best Practice (right click and "Reselect all SmartList Selections" daily)   Diet/type: tubefeeds and NPO DVT prophylaxis: per primary  GI prophylaxis: N/A Lines: N/A Foley:  N/A Code Status:  full code Last date of multidisciplinary goals of care discussion [pending]   Labs   CBC: Recent Labs  Lab 02/01/23 1148 02/05/23 0824 02/06/23 0657  WBC 7.7  --  9.5  HGB 12.3* 8.8* 9.9*  HCT 36.9* 26.0* 29.6*  MCV 96.9  --  95.8  PLT 327  --  302    Basic Metabolic Panel: Recent Labs  Lab 02/01/23 1148 02/05/23 0824 02/05/23 1738 02/06/23 0657  NA 135 135 134* 134*  K 4.7 4.7 4.0 3.9  CL 101 102 104 102  CO2 24  --  22 25  GLUCOSE 202* 214* 179* 81  BUN 16 19 21 15   CREATININE 0.80 0.70 0.90 0.72  CALCIUM 9.2  --  8.3* 8.1*  MG  --   --   --  1.8  PHOS  --   --   --  3.4   GFR: Estimated Creatinine Clearance: 74.7 mL/min (by C-G formula based on SCr of 0.72 mg/dL). Recent Labs  Lab 02/01/23 1148 02/06/23 0657  WBC 7.7 9.5    Liver Function Tests: No results for input(s): "AST", "ALT", "ALKPHOS", "BILITOT", "PROT", "ALBUMIN" in the last 168 hours. No results for input(s):  "LIPASE", "AMYLASE" in the last 168 hours. No results for input(s): "AMMONIA" in the last 168 hours.  ABG    Component Value Date/Time   HCO3 26.5 12/09/2022 0607   TCO2 26 02/05/2023 0824   O2SAT 81.4 12/09/2022 0607     Coagulation Profile: No results for input(s): "INR", "PROTIME" in the last 168 hours.  Cardiac Enzymes: No results for input(s): "CKTOTAL", "CKMB", "CKMBINDEX", "TROPONINI" in the last 168 hours.  HbA1C: Hgb A1c MFr Bld  Date/Time Value Ref Range Status  12/09/2022 05:34 AM 8.0 (H) 4.8 - 5.6 % Final    Comment:    (NOTE) Pre diabetes:          5.7%-6.4%  Diabetes:              >6.4%  Glycemic control for   <7.0% adults with diabetes   11/14/2019 03:55 PM 8.0 (H) 4.8 - 5.6 % Final    Comment:    (NOTE) Pre diabetes:          5.7%-6.4% Diabetes:              >6.4% Glycemic control for   <7.0% adults with diabetes     CBG: Recent Labs  Lab 02/05/23 2148 02/05/23 2300 02/06/23 0024 02/06/23 0323 02/06/23 0755  GLUCAP 185* 180* 163* 140* 66*   CRITICAL CARE Performed by: Lanier Clam   Total critical care time: 36 minutes  Critical care time was exclusive of separately billable procedures and treating other patients. Critical care was necessary to treat or prevent imminent or life-threatening deterioration.  Critical care was time spent personally by me on the following activities: development of treatment plan with patient and/or surrogate as well as nursing, discussions with consultants, evaluation of patient's response to treatment, examination of patient, obtaining history from patient or surrogate, ordering and performing treatments and interventions, ordering and review of laboratory studies, ordering and review of radiographic studies, pulse oximetry and re-evaluation of patient's condition.  Tessie Fass MSN, AGACNP-BC [facility] Amion for pager  02/06/2023, 11:17 AM

## 2023-02-06 NOTE — Evaluation (Addendum)
Physical Therapy Evaluation Patient Details Name: [name] MRN: [id number] DOB: Jan 18, 1951 Today's Date: 02/06/2023  History of Present Illness  Pt is 72 year old presented to [facility] on  02/05/23 for laryngeal CA. Underwent total laryngectomy on 02/05/23 PMH - tracheostomy 12/10/22, type 1 diabetes mellitus, recent COPD diagnosis, coronary artery disease, hyperlipidemia, hypertension and L BKA  Clinical Impression  Pt admitted with above diagnosis and presents to PT with functional limitations due to deficits listed below (See PT problem list). Pt needs skilled PT to maximize independence and safety. Expect pt will make good progress with mobility. May need to use more than his cane/walking stick to return home with friend. His friend will not be able to physically assist so he will need to be fairly independent.          Recommendations for follow up therapy are one component of a multi-disciplinary discharge planning process, led by the attending physician.  Recommendations may be updated based on patient status, additional functional criteria and insurance authorization.  Follow Up Recommendations       Assistance Recommended at Discharge Intermittent Supervision/Assistance  Patient can return home with the following  A little help with walking and/or transfers;A little help with bathing/dressing/bathroom;Assistance with cooking/housework    Equipment Recommendations None recommended by PT  Recommendations for Other Services       Functional Status Assessment Patient has had a recent decline in their functional status and demonstrates the ability to make significant improvements in function in a reasonable and predictable amount of time.     Precautions / Restrictions Precautions Precautions: Fall      Mobility  Bed Mobility Overal bed mobility: Needs Assistance Bed Mobility: Supine to Sit     Supine to sit: Min assist     General bed mobility comments: Assist to pull  trunk up into sitting    Transfers Overall transfer level: Needs assistance Equipment used: 2 person hand held assist Transfers: Sit to/from Stand, Bed to chair/wheelchair/BSC Sit to Stand: Min assist, +2 safety/equipment   Step pivot transfers: Min assist, +2 safety/equipment       General transfer comment: Assist for balance and stability    Ambulation/Gait                  Stairs            Wheelchair Mobility    Modified Rankin (Stroke Patients Only)       Balance Overall balance assessment: Needs assistance Sitting-balance support: No upper extremity supported, Feet supported Sitting balance-Leahy Scale: Good     Standing balance support: Single extremity supported, Bilateral upper extremity supported Standing balance-Leahy Scale: Poor Standing balance comment: UE support                             Pertinent Vitals/Pain Pain Assessment Pain Assessment: No/denies pain    Home Living Family/patient expects to be discharged to:: Private residence Living Arrangements: Spouse/significant other Available Help at Discharge: Family;Available 24 hours/day Type of Home: House Home Access: Ramped entrance       Home Layout: One level Home Equipment: Grab bars - tub/shower;Grab bars - toilet;Wheelchair - manual;Cane - single Librarian, academic (2 wheels);Rollator (4 wheels) Additional Comments: Pt has been staying with significant other    Prior Function Prior Level of Function : Independent/Modified Independent             Mobility Comments: Uses walking stick  Hand Dominance   Dominant Hand: Right    Extremity/Trunk Assessment   Upper Extremity Assessment Upper Extremity Assessment: Defer to OT evaluation    Lower Extremity Assessment Lower Extremity Assessment: Generalized weakness       Communication   Communication: Other (comment) (laryngectomy tube)  Cognition Arousal/Alertness: Awake/alert Behavior  During Therapy: Impulsive Overall Cognitive Status: Within Functional Limits for tasks assessed                                          General Comments      Exercises     Assessment/Plan    PT Assessment Patient needs continued PT services  PT Problem List Decreased strength;Decreased activity tolerance;Decreased balance;Decreased mobility       PT Treatment Interventions DME instruction;Gait training;Functional mobility training;Therapeutic activities;Stair training;Therapeutic exercise;Balance training;Patient/family education    PT Goals (Current goals can be found in the Care Plan section)  Acute Rehab PT Goals Patient Stated Goal: not stated PT Goal Formulation: With patient Time For Goal Achievement: 02/20/23 Potential to Achieve Goals: Good    Frequency Min 3X/week     Co-evaluation               AM-PAC PT "6 Clicks" Mobility  Outcome Measure Help needed turning from your back to your side while in a flat bed without using bedrails?: None Help needed moving from lying on your back to sitting on the side of a flat bed without using bedrails?: A Little Help needed moving to and from a bed to a chair (including a wheelchair)?: A Little Help needed standing up from a chair using your arms (e.g., wheelchair or bedside chair)?: A Little Help needed to walk in hospital room?: Total Help needed climbing 3-5 steps with a railing? : Total 6 Click Score: 15    End of Session   Activity Tolerance: Patient tolerated treatment well Patient left: in chair;with call bell/phone within reach;with nursing/sitter in room;with family/visitor present Nurse Communication: Other (comment) (Nurse present) PT Visit Diagnosis: Unsteadiness on feet (R26.81);Other abnormalities of gait and mobility (R26.89);Muscle weakness (generalized) (M62.81)    Time: 1610-9604 PT Time Calculation (min) (ACUTE ONLY): 16 min   Charges:   PT Evaluation $PT Eval Moderate  Complexity: 1 Mod          [facility] PT Acute Rehabilitation Services Office [phone]   Angelina Ok [facility] 02/06/2023, 5:39 PM

## 2023-02-06 NOTE — Inpatient Diabetes Management (Signed)
Inpatient Diabetes Program Recommendations  AACE/ADA: New Consensus Statement on Inpatient Glycemic Control (2015)  Target Ranges:  Prepandial:   less than 140 mg/dL      Peak postprandial:   less than 180 mg/dL (1-2 hours)      Critically ill patients:  140 - 180 mg/dL   Lab Results  Component Value Date   GLUCAP 66 (L) 02/06/2023   HGBA1C 8.0 (H) 12/09/2022    Review of Glycemic Control  Diabetes history: DM1  Outpatient Diabetes medications: Insulin pump Current orders for Inpatient glycemic control: Levemir 5 units q 12 hrs., Novolog 1-3 units q 4 hrs. correction  Inpatient Diabetes Program Recommendations:   Discussed with Tessie Fass regarding insulin recommendations. Jevity tube feed started @ 65 cc/hr. Agree with current regimen and will continue to reassess for changes needed.Suggest holding off of restarting insulin pump until later after nutrition goals and CBGs stable.   Thank you, Billy Fischer. Allard Lightsey, RN, MSN, CDE  Diabetes Coordinator Inpatient Glycemic Control Team Team Pager [phone] (8am-5pm) 02/06/2023 9:34 AM

## 2023-02-06 NOTE — Progress Notes (Signed)
eLink Physician-Brief Progress Note Patient Name: KAZI THRON DOB: 06/14/1951 MRN: [id number]   Date of Service  02/06/2023  HPI/Events of Note  BSRN asking for colace, nicotene patch and something for anxiety     has NG  eICU Interventions  Nicotine patch, colace and melatonin ordered, avoiding benzos for now.      Intervention Category Intermediate Interventions: Other:  Ranee Gosselin 02/06/2023, 11:17 PM

## 2023-02-06 NOTE — Progress Notes (Signed)
OTOLARYNGOLOGY - HEAD AND NECK SURGERY FACIAL PLASTIC & RECONSTRUCTIVE SURGERY PROGRESS NOTE  ID: [name] with larynx cancer POD#1 s/p total laryngectomy, bilateral neck dissection (02/05/23 - Pollyann Kennedy)  Subjective: NAEON Doing well, no complaints. Pain well controlled PCC following for hypotension and blood sugar control   Objective: Vital signs in last 24 hours: Temp:  [97.8 F (36.6 C)-98.4 F (36.9 C)] 98.4 F (36.9 C) (06/15 0319) Pulse Rate:  [55-77] 59 (06/15 0700) Resp:  [8-33] 16 (06/15 0700) BP: (115-149)/(36-65) 121/42 (06/15 0700) SpO2:  [90 %-99 %] 99 % (06/15 0700) Arterial Line BP: (44-180)/(16-44) 44/39 (06/15 0100) FiO2 (%):  [28 %] 28 % (06/15 0336) Weight:  [59.7 kg-61.5 kg] 61.5 kg (06/15 0500)  Physical exam: Resting comfortably in NAD. With neck in flexion stoma becomes narrowed - inserted Lary stoma tube and soft collar. Neck flaps down, staple line intact. JP Drains holding bulb suction with s/s output.   @LABLAST2 (wbc:2,hgb:2,hct:2,plt:2) Recent Labs    02/05/23 0824 02/05/23 1738  NA 135 134*  K 4.7 4.0  CL 102 104  CO2  --  22  GLUCOSE 214* 179*  BUN 19 21  CREATININE 0.70 0.90  CALCIUM  --  8.3*    Medications: I have reviewed the patient's current medications.  JP Drain output total  Assessment/Plan: POD#1 s/p total laryngectomy, bilateral neck dissections. Doing well.   Plan today: - DC Foley - OOB To chair, ambulate with PT (given amputee) - Advance continuous tube feeds; hold for any symptoms of nause - Appreciate [facility] co-management of medical issues - Lary stoma tube care  Dispo: ICU over the weekend   LOS: 1 day   Scarlette Ar 02/06/2023, 7:37 AM  I have personally spent 15 minutes involved in face-to-face and non-face-to-face activities for this patient on the day of the visit.  Professional time spent includes the following activities, in addition to those noted in the documentation: preparing to see the patient  (eg, review of tests), obtaining and/or reviewing separately obtained history, performing a medically appropriate examination and/or evaluation, counseling and educating the patient/family/caregiver, ordering medications, tests or procedures, referring and communicating with other healthcare professionals, documenting clinical information in the electronic or other health record, independently interpreting results and communicating results with the patient/family/caregiver, care coordination.  Electronically signed by:  Scarlette Ar, MD  Staff Physician Facial Plastic & Reconstructive Surgery Otolaryngology - Head and Neck Surgery [facility] [place]

## 2023-02-06 NOTE — Evaluation (Signed)
Laryngectomy Evaluation and Treatment Patient Details  Name: [name] MRN: [id number] Date of Birth: 02-Feb-1951  Today's Date: 02/06/2023 Time: 0925-1035 SLP Time Calculation (min) (ACUTE ONLY): 70 min  Past Medical History:  Past Medical History:  Diagnosis Date   Cancer (HCC)    throat   Cigarette smoker    COPD (chronic obstructive pulmonary disease) (HCC)    Coronary artery disease    Diabetes mellitus    type 1  x 50 yrs   Diabetic retinopathy (HCC)    Dyslipidemia    Hypertension    Myocardial infarction (HCC)    "a long time ago" from insulin pump   Peripheral vascular disease (HCC)    Past Surgical History:  Past Surgical History:  Procedure Laterality Date   ABDOMINAL ANGIOGRAM N/A 05/29/2014   Procedure: ABDOMINAL ANGIOGRAM;  Surgeon: Pamella Pert, MD;  Location: [facility] CATH LAB;  Service: Cardiovascular;  Laterality: N/A;   ABDOMINAL AORTOGRAM W/LOWER EXTREMITY N/A 11/14/2019   Procedure: ABDOMINAL AORTOGRAM W/LOWER EXTREMITY;  Surgeon: Nada Libman, MD;  Location: [facility] INVASIVE CV LAB;  Service: Cardiovascular;  Laterality: N/A;   AMPUTATION Left 10/25/2015   Procedure: Left Foot 5th Ray Amputation;  Surgeon: Nadara Mustard, MD;  Location: [facility] OR;  Service: Orthopedics;  Laterality: Left;   AMPUTATION Left 12/06/2015   Procedure: AMPUTATION BELOW KNEE;  Surgeon: Nadara Mustard, MD;  Location: [facility] OR;  Service: Orthopedics;  Laterality: Left;   CARDIAC CATHETERIZATION     EF is 55-60% and no wall motion abnormalities (long long time ago)   DIRECT LARYNGOSCOPY N/A 12/10/2022   Procedure: DIRECT LARYNGOSCOPY WITH BIOPSY;  Surgeon: Serena Colonel, MD;  Location: [facility] OR;  Service: ENT;  Laterality: N/A;   FEMORAL-POPLITEAL BYPASS GRAFT Left 10/24/2015   Procedure: BYPASS GRAFT FEMORAL below knee POPLITEAL ARTERY with Left Saphenous Vein;  Surgeon: Nada Libman, MD;  Location: [facility] OR;  Service: Vascular;  Laterality: Left;   FEMORAL-POPLITEAL BYPASS GRAFT Right  11/15/2019   Procedure: BYPASS GRAFT FEMORAL-POPLITEAL ARTERY using Right Leg Greater Saphenous Vein;  Surgeon: Nada Libman, MD;  Location: [facility] OR;  Service: Vascular;  Laterality: Right;   FRACTURE SURGERY     left arm "many yrs ago"   LARYNGETOMY N/A 02/05/2023   Procedure: TOTAL LARYNGECTOMY;  Surgeon: Serena Colonel, MD;  Location: [facility] OR;  Service: ENT;  Laterality: N/A;   LOWER EXTREMITY ANGIOGRAM N/A 01/23/2014   Procedure: LOWER EXTREMITY ANGIOGRAM;  Surgeon: Pamella Pert, MD;  Location: [facility] CATH LAB;  Service: Cardiovascular;  Laterality: N/A;   LOWER EXTREMITY ANGIOGRAM N/A 07/31/2014   Procedure: LOWER EXTREMITY ANGIOGRAM;  Surgeon: Pamella Pert, MD;  Location: [facility] CATH LAB;  Service: Cardiovascular;  Laterality: N/A;   LOWER EXTREMITY ANGIOGRAM Left 10/11/2015   Procedure: Lower Extremity Angiogram;  Surgeon: Nada Libman, MD;  Location: [facility] INVASIVE CV LAB;  Service: Cardiovascular;  Laterality: Left;   PERIPHERAL VASCULAR BALLOON ANGIOPLASTY  11/14/2019   Procedure: PERIPHERAL VASCULAR BALLOON ANGIOPLASTY;  Surgeon: Nada Libman, MD;  Location: [facility] INVASIVE CV LAB;  Service: Cardiovascular;;   PERIPHERAL VASCULAR CATHETERIZATION Left 10/11/2015   Procedure: Peripheral Vascular Balloon Angioplasty;  Surgeon: Nada Libman, MD;  Location: [facility] INVASIVE CV LAB;  Service: Cardiovascular;  Laterality: Left;  sfa failed unable to cross occluded sfa   PERIPHERAL VASCULAR CATHETERIZATION N/A 10/11/2015   Procedure: Abdominal Aortogram;  Surgeon: Nada Libman, MD;  Location: [facility] INVASIVE CV LAB;  Service: Cardiovascular;  Laterality: N/A;  RADICAL NECK DISSECTION Bilateral 02/05/2023   Procedure: NECK DISSECTION;  Surgeon: Serena Colonel, MD;  Location: [facility] OR;  Service: ENT;  Laterality: Bilateral;   STUMP REVISION Left 03/02/2016   Procedure: Revision Left Below Knee Amputation;  Surgeon: Nadara Mustard, MD;  Location: [facility] OR;  Service: Orthopedics;  Laterality: Left;   TOTAL  LARYNGECTOMY N/A 02/05/2023   Per surgical report on 02/05/2023   TRACHEOSTOMY TUBE PLACEMENT N/A 12/10/2022   Procedure: TRACHEOSTOMY UNDER LOCAL;  Surgeon: Serena Colonel, MD;  Location: [facility] OR;  Service: ENT;  Laterality: N/A;   HPI:  [name] with COPD, CAD, PVD,HTN, DM Type 1, Diabetic Neuropathy, and a 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 Pollyann Kennedy).    Assessment / Plan / Recommendation  Clinical Impression   SLP followed up s/p total laryngectomy 02/05/23 for pt education and training. Laryngectomy Pulmonary Kit at the bedside, and standard lary tube placed by ENT this morning per chart review and nursing report. Pt initially had humidified O2 over stomal lary site. Coordinated with RT and nursing, SLP placed HME to lary tube, (humidified O2 removed by RT) pt tolerating well on room air. Education initiated with pt and daughter regarding anatomical changes post total laryngectomy, need for HME use 24 hours a day as tolerated to reduce risk for mucous plugging and optimize pulmonary function ("new nose"). SLP educated donning and doffing of HME and precautions to place new HME 24 hours a day (single use); care tips at bedside. Educated on attachment options for HMEs; peristomal and intraluminal with focus on intraluminal while pt heals post op. Pt attempted HME placement with SLP instruction, will need further practice. Reinforced education on neck breather only status, lost connection of upper respiratory system to lungs, and changes to communication. Alaryngeal communication options were educated upon. SLP set up electrolarynx at bedside with intraoral adapter. Educated regarding volume, pitch, chunking, and articulation for alaryngeal communication intelligible. Furhter SLP education and support warranted. SLP to closely follow.     SLP  Visit Diagnosis: Aphonia (R49.1)    SLP Assessment  Patient needs continued Speech Lanaguage Pathology Services    Recommendations for follow up therapy are one component of a multi-disciplinary discharge planning process, led by the attending physician.  Recommendations may be updated based on patient status, additional functional criteria and insurance authorization.  Follow Up Recommendations       Assistance Recommended at Discharge   Outpatient SLP services indicated  Functional Status Assessment    Frequency and Duration min 3x week  2 weeks    PMSV Trial     Laryngectomy Tube   Fenestrated: No Trach Collar Period:  (on room air) Secretion Description:  (none observed)    Vent Dependency  Vent Dependent: No FiO2 (%):  (room air)               Jaicob Dia H. MA, CCC-SLP Acute Rehabilitation Services   02/06/2023, 10:57 AM

## 2023-02-07 DIAGNOSIS — Z9002 Acquired absence of larynx: Secondary | ICD-10-CM | POA: Diagnosis not present

## 2023-02-07 DIAGNOSIS — E109 Type 1 diabetes mellitus without complications: Secondary | ICD-10-CM | POA: Diagnosis not present

## 2023-02-07 DIAGNOSIS — J387 Other diseases of larynx: Secondary | ICD-10-CM | POA: Diagnosis not present

## 2023-02-07 LAB — CBC
HCT: 29.3 % — ABNORMAL LOW (ref 39.0–52.0)
Hemoglobin: 9.8 g/dL — ABNORMAL LOW (ref 13.0–17.0)
MCH: 31.4 pg (ref 26.0–34.0)
MCHC: 33.4 g/dL (ref 30.0–36.0)
MCV: 93.9 fL (ref 80.0–100.0)
Platelets: 273 10*3/uL (ref 150–400)
RBC: 3.12 MIL/uL — ABNORMAL LOW (ref 4.22–5.81)
RDW: 13.4 % (ref 11.5–15.5)
WBC: 8.8 10*3/uL (ref 4.0–10.5)
nRBC: 0 % (ref 0.0–0.2)

## 2023-02-07 LAB — GLUCOSE, CAPILLARY
Glucose-Capillary: 273 mg/dL — ABNORMAL HIGH (ref 70–99)
Glucose-Capillary: 297 mg/dL — ABNORMAL HIGH (ref 70–99)
Glucose-Capillary: 318 mg/dL — ABNORMAL HIGH (ref 70–99)
Glucose-Capillary: 103 mg/dL — ABNORMAL HIGH (ref 70–99)
Glucose-Capillary: 141 mg/dL — ABNORMAL HIGH (ref 70–99)
Glucose-Capillary: 168 mg/dL — ABNORMAL HIGH (ref 70–99)
Glucose-Capillary: 202 mg/dL — ABNORMAL HIGH (ref 70–99)

## 2023-02-07 LAB — BASIC METABOLIC PANEL
Anion gap: 12 (ref 5–15)
BUN: 20 mg/dL (ref 8–23)
CO2: 25 mmol/L (ref 22–32)
Calcium: 8.2 mg/dL — ABNORMAL LOW (ref 8.9–10.3)
Chloride: 100 mmol/L (ref 98–111)
Creatinine, Ser: 0.65 mg/dL (ref 0.61–1.24)
GFR, Estimated: >60 mL/min (ref >60)
Glucose, Bld: 279 mg/dL — ABNORMAL HIGH (ref 70–99)
Potassium: 4.3 mmol/L (ref 3.5–5.1)
Sodium: 137 mmol/L (ref 135–145)

## 2023-02-07 LAB — MAGNESIUM
Magnesium: 1.9 mg/dL (ref 1.7–2.4)
Magnesium: 1.9 mg/dL (ref 1.7–2.4)

## 2023-02-07 LAB — PHOSPHORUS
Phosphorus: 2.7 mg/dL (ref 2.5–4.6)
Phosphorus: 3.2 mg/dL (ref 2.5–4.6)

## 2023-02-07 MED ORDER — INSULIN ASPART 100 UNIT/ML IJ SOLN
3.0000 [IU] | INTRAMUSCULAR | Status: DC
  Administered 2023-02-07 – 2023-02-11 (×18): 3 [IU] via SUBCUTANEOUS

## 2023-02-07 MED ORDER — SENNOSIDES-DOCUSATE SODIUM 8.6-50 MG PO TABS
1.0000 | ORAL_TABLET | Freq: Every day | ORAL | Status: DC
  Administered 2023-02-07 – 2023-02-15 (×6): 1
  Filled 2023-02-07 (×6): qty 1

## 2023-02-07 MED ORDER — INSULIN ASPART 100 UNIT/ML IJ SOLN
0.0000 [IU] | INTRAMUSCULAR | Status: DC
  Administered 2023-02-07: 11 [IU] via SUBCUTANEOUS
  Administered 2023-02-07: 3 [IU] via SUBCUTANEOUS
  Administered 2023-02-07: 8 [IU] via SUBCUTANEOUS
  Administered 2023-02-07 – 2023-02-08 (×2): 5 [IU] via SUBCUTANEOUS
  Administered 2023-02-08: 2 [IU] via SUBCUTANEOUS
  Administered 2023-02-08: 5 [IU] via SUBCUTANEOUS
  Administered 2023-02-09: 8 [IU] via SUBCUTANEOUS
  Administered 2023-02-09: 3 [IU] via SUBCUTANEOUS
  Administered 2023-02-09: 5 [IU] via SUBCUTANEOUS
  Administered 2023-02-09: 8 [IU] via SUBCUTANEOUS
  Administered 2023-02-09: 5 [IU] via SUBCUTANEOUS
  Administered 2023-02-10: 8 [IU] via SUBCUTANEOUS
  Administered 2023-02-10: 5 [IU] via SUBCUTANEOUS
  Administered 2023-02-10: 8 [IU] via SUBCUTANEOUS
  Administered 2023-02-10 (×2): 3 [IU] via SUBCUTANEOUS
  Administered 2023-02-11: 8 [IU] via SUBCUTANEOUS
  Administered 2023-02-11: 5 [IU] via SUBCUTANEOUS
  Administered 2023-02-11: 2 [IU] via SUBCUTANEOUS
  Administered 2023-02-11: 3 [IU] via SUBCUTANEOUS
  Administered 2023-02-12 (×2): 2 [IU] via SUBCUTANEOUS
  Administered 2023-02-12 (×2): 5 [IU] via SUBCUTANEOUS
  Administered 2023-02-12: 3 [IU] via SUBCUTANEOUS
  Administered 2023-02-13: 11 [IU] via SUBCUTANEOUS
  Administered 2023-02-13 (×2): 8 [IU] via SUBCUTANEOUS
  Administered 2023-02-13: 2 [IU] via SUBCUTANEOUS
  Administered 2023-02-14: 5 [IU] via SUBCUTANEOUS
  Administered 2023-02-14: 8 [IU] via SUBCUTANEOUS
  Administered 2023-02-14: 5 [IU] via SUBCUTANEOUS
  Administered 2023-02-14 – 2023-02-15 (×2): 3 [IU] via SUBCUTANEOUS
  Administered 2023-02-15 (×2): 5 [IU] via SUBCUTANEOUS
  Administered 2023-02-16 (×2): 11 [IU] via SUBCUTANEOUS
  Administered 2023-02-16: 8 [IU] via SUBCUTANEOUS

## 2023-02-07 MED ORDER — ACETAMINOPHEN 325 MG PO TABS
650.0000 mg | ORAL_TABLET | Freq: Four times a day (QID) | ORAL | Status: DC | PRN
  Administered 2023-02-07 – 2023-02-08 (×2): 650 mg
  Filled 2023-02-07 (×3): qty 2

## 2023-02-07 MED ORDER — INSULIN DETEMIR 100 UNIT/ML ~~LOC~~ SOLN
8.0000 [IU] | Freq: Two times a day (BID) | SUBCUTANEOUS | Status: DC
  Administered 2023-02-07 – 2023-02-08 (×3): 8 [IU] via SUBCUTANEOUS
  Filled 2023-02-07 (×4): qty 0.08

## 2023-02-07 MED ORDER — MAGNESIUM SULFATE 2 GM/50ML IV SOLN
2.0000 g | Freq: Once | INTRAVENOUS | Status: AC
  Administered 2023-02-07: 2 g via INTRAVENOUS
  Filled 2023-02-07: qty 50

## 2023-02-07 MED ORDER — DOCUSATE SODIUM 50 MG/5ML PO LIQD
100.0000 mg | Freq: Every day | ORAL | Status: DC
  Administered 2023-02-07 – 2023-02-16 (×8): 100 mg
  Filled 2023-02-07 (×10): qty 10

## 2023-02-07 MED ORDER — ROSUVASTATIN CALCIUM 5 MG PO TABS
10.0000 mg | ORAL_TABLET | Freq: Every day | ORAL | Status: DC
  Administered 2023-02-07 – 2023-02-16 (×9): 10 mg
  Filled 2023-02-07 (×3): qty 2
  Filled 2023-02-07: qty 1
  Filled 2023-02-07 (×4): qty 2
  Filled 2023-02-07: qty 1
  Filled 2023-02-07: qty 2

## 2023-02-07 MED ORDER — HYDROXYZINE HCL 10 MG PO TABS: 10.0000 mg | ORAL_TABLET | Freq: Every evening | ORAL | Status: DC | PRN

## 2023-02-07 MED ORDER — SERTRALINE HCL 50 MG PO TABS
50.0000 mg | ORAL_TABLET | Freq: Every day | ORAL | Status: DC
  Administered 2023-02-07 – 2023-02-16 (×8): 50 mg
  Filled 2023-02-07 (×8): qty 1

## 2023-02-07 NOTE — Inpatient Diabetes Management (Signed)
Inpatient Diabetes Program Recommendations  AACE/ADA: New Consensus Statement on Inpatient Glycemic Control (2015)  Target Ranges:  Prepandial:   less than 140 mg/dL      Peak postprandial:   less than 180 mg/dL (1-2 hours)      Critically ill patients:  140 - 180 mg/dL   Lab Results  Component Value Date   GLUCAP 297 (H) 02/07/2023   HGBA1C 8.0 (H) 12/09/2022    Inpatient Diabetes Program Recommendations:   Please consider: -Novolog 3 units q 4 hrs. Tube feed coverage  Thank you, [name]. [name] Perusse, RN, MSN, CDE  Diabetes Coordinator Inpatient Glycemic Control Team Team Pager [phone] (8am-5pm) 02/07/2023 10:49 AM

## 2023-02-07 NOTE — Progress Notes (Signed)
OTOLARYNGOLOGY - HEAD AND NECK SURGERY FACIAL PLASTIC & RECONSTRUCTIVE SURGERY PROGRESS NOTE  ID: [name] with larynx cancer POD#2 s/p total laryngectomy, bilateral neck dissection (02/05/23 - Pollyann Kennedy)  Subjective: NAEON Worked with PT and SLP Only question is when can he drink  Not passing gas or having bowel movement. No N/V. Voiding fine.  Objective: Vital signs in last 24 hours: Temp:  [98 F (36.7 C)-98.3 F (36.8 C)] 98.1 F (36.7 C) (06/16 0808) Pulse Rate:  [53-69] 53 (06/16 0745) Resp:  [0-26] 18 (06/16 0745) BP: (117-150)/(37-61) 132/49 (06/16 0600) SpO2:  [86 %-100 %] 95 % (06/16 0745) FiO2 (%):  [28 %] 28 % (06/15 0900) Weight:  [61.6 kg] 61.6 kg (06/16 0409)  Physical exam: Resting comfortably in NAD. Lary tube with HME in tact, removed briefly, stoma healing well. Neck flaps down, staple line intact. JP Drains holding bulb suction with s/s output.   @LABLAST2 (wbc:2,hgb:2,hct:2,plt:2) Recent Labs    02/06/23 0657 02/07/23 0616  NA 134* 137  K 3.9 4.3  CL 102 100  CO2 25 25  GLUCOSE 81 279*  BUN 15 20  CREATININE 0.72 0.65  CALCIUM 8.1* 8.2*     Medications: I have reviewed the patient's current medications.  JP Drain output 285 mL total  Assessment/Plan: POD#2 s/p total laryngectomy, bilateral neck dissections. Doing well.   Plan today: - OOB To chair, ambulate with PT (given amputee) - Schedule colace to improve bowel movements - Advance continuous tube feeds; hold for any symptoms of nause - Appreciate [facility] co-management of medical issues - Lary stoma tube care  Dispo: ICU over the weekend   LOS: 2 days   Scarlette Ar 02/07/2023, 8:29 AM  I have personally spent 12 minutes involved in face-to-face and non-face-to-face activities for this patient on the day of the visit.  Professional time spent includes the following activities, in addition to those noted in the documentation: preparing to see the patient (eg, review of tests), obtaining  and/or reviewing separately obtained history, performing a medically appropriate examination and/or evaluation, counseling and educating the patient/family/caregiver, ordering medications, tests or procedures, referring and communicating with other healthcare professionals, documenting clinical information in the electronic or other health record, independently interpreting results and communicating results with the patient/family/caregiver, care coordination.  Electronically signed by:  Scarlette Ar, MD  Staff Physician Facial Plastic & Reconstructive Surgery Otolaryngology - Head and Neck Surgery [facility] [place]

## 2023-02-07 NOTE — Progress Notes (Signed)
Nicotine patch dc/d per patient request.

## 2023-02-07 NOTE — Progress Notes (Signed)
eLink Physician-Brief Progress Note Patient Name: [name] DOB: 08/03/1951 MRN: [id number]   Date of Service  02/07/2023  HPI/Events of Note  Magnesium 1.9  eICU Interventions  Mag sulfate 2g     Intervention Category Minor Interventions: Electrolytes abnormality - evaluation and management  Rajesh Wyss 02/07/2023, 9:42 PM

## 2023-02-07 NOTE — Progress Notes (Signed)
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 PCCM was consulted to assist with post-op medical management.   Pertinent  Medical History   Past Medical History:  Diagnosis Date   Cancer (HCC)    throat   Cigarette smoker    COPD (chronic obstructive pulmonary disease) (HCC)    Coronary artery disease    Diabetes mellitus    type 1  x 50 yrs   Diabetic retinopathy (HCC)    Dyslipidemia    Hypertension    Myocardial infarction [facility])    "a long time ago" from insulin pump   Peripheral vascular disease (HCC)      Significant Hospital Events: Including procedures, antibiotic start and stop dates in addition to other pertinent events   6/14 Laryngectomy, PCCM consulted for medical management  6/15-16 post op PT/OT/SLP, working to control CBGs.   Interim History / Subjective:   POD2 total lary and bilat neck dissection   CBGs still up   Objective   Blood pressure (!) 132/49, pulse (!) 53, temperature 98.1 F (36.7 C), temperature source Oral, resp. rate 18, height 5\' 11"  (1.803 m), weight 61.6 kg, SpO2 95 %.        Intake/Output Summary (Last 24 hours) at 02/07/2023 0936 Last data filed at 02/07/2023 0600 Gross per 24 hour  Intake 1564.62 ml  Output 430 ml  Net 1134.62 ml   Filed Weights   02/05/23 1330 02/06/23 0500 02/07/23 0409  Weight: 59.7 kg 61.5 kg 61.6 kg   Examination: General: chronically ill older M NAD  HENT: Surgical site c/d/well approximated. Lary tube with HME Jps with serosang  output Lungs: CTAb  Cardiovascular: rrr  Abdomen: thin ndnt  Extremities: no acute joint deformity. 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, bilat neck dissection (02/05/23)  Hx acute resp failure 2/2 obstructing mass in setting of above, s/p trach (11/2022)  Hx COPD  Hx tobacco abuse  DM1 HTN HLD CAD  PVD s/p L BKA  Severe protein calorie malnutrition 2/2 laryngeal ca  -keep in ICU at least through the wknd -appreciate ENT recs re lary tube mgmnt  -incr insulin coverage again (making levemir 8u BID, incr SSI from sSSI to moderate) -- had d/w DM coord previously, planning to resume insulin pump after he has been maintained on EN  -home statin  -hold ASA and holding home metop for now w slight SB  -EN per RDN -PT/OT/SLP  -incr bowel reg -add home at bedtime sertraline    Best Practice (right click and "Reselect all SmartList Selections" daily)   Diet/type: tubefeeds and NPO DVT prophylaxis: per primary  GI prophylaxis: N/A Lines: N/A Foley:  N/A Code Status:  full code Last date of multidisciplinary goals of care discussion [pt and daughter updated]   Labs  CBC: Recent Labs  Lab 02/01/23 1148 02/05/23 0824 02/06/23 0657 02/07/23 0616  WBC 7.7  --  9.5 8.8  HGB 12.3* 8.8* 9.9* 9.8*  HCT 36.9* 26.0* 29.6* 29.3*  MCV 96.9  --  95.8 93.9  PLT 327  --  302 273    Basic Metabolic Panel: Recent Labs  Lab 02/01/23 1148 02/05/23 0824 02/05/23 1738 02/06/23 0657 02/06/23 1725 02/07/23 0616  NA 135 135 134* 134*  --  137  K 4.7 4.7 4.0 3.9  --  4.3  CL 101 102 104 102  --  100  CO2 24  --  22 25  --  25  GLUCOSE 202* 214* 179* 81  --  279*  BUN 16 19 21 15   --  20  CREATININE 0.80 0.70 0.90 0.72  --  0.65  CALCIUM 9.2  --  8.3* 8.1*  --  8.2*  MG  --    --   --  1.8 2.3 1.9  PHOS  --   --   --  3.4 3.0 2.7   GFR: Estimated Creatinine Clearance: 74.9 mL/min (by C-G formula based on SCr of 0.65 mg/dL). Recent Labs  Lab 02/01/23 1148 02/06/23 0657 02/07/23 0616  WBC 7.7 9.5 8.8    Liver Function Tests: No results for input(s): "AST", "ALT", "ALKPHOS", "BILITOT", "PROT", "ALBUMIN" in the last 168 hours. No results for input(s): "LIPASE", "AMYLASE" in the last 168 hours. No results for input(s): "AMMONIA" in the last 168 hours.  ABG    Component Value Date/Time   HCO3 26.5 12/09/2022 0607   TCO2 26 02/05/2023 0824   O2SAT 81.4 12/09/2022 0607     Coagulation Profile: No results for input(s): "INR", "PROTIME" in the last 168 hours.  Cardiac Enzymes: No results for input(s): "CKTOTAL", "CKMB", "CKMBINDEX", "TROPONINI" in the last 168 hours.  HbA1C: Hgb A1c MFr Bld  Date/Time Value Ref Range Status  12/09/2022 05:34 AM 8.0 (H) 4.8 - 5.6 % Final    Comment:    (NOTE) Pre diabetes:          5.7%-6.4%  Diabetes:              >6.4%  Glycemic control for   <7.0% adults with diabetes   11/14/2019 03:55 PM 8.0 (H) 4.8 - 5.6 % Final    Comment:    (NOTE) Pre diabetes:          5.7%-6.4% Diabetes:              >6.4% Glycemic control for   <7.0% adults with diabetes     CBG: Recent Labs  Lab 02/06/23 1855 02/06/23 1921 02/06/23 2317 02/07/23 0321 02/07/23 0804  GLUCAP 321* 317* 254* 273* 297*   CRITICAL CARE Performed by: Lanier Clam   Total critical care time: 35 minutes  Critical care time was exclusive of separately billable procedures and treating other patients. Critical care was necessary to treat or prevent imminent or life-threatening deterioration.  Critical care was time spent personally by me on the following activities: development of treatment plan with patient and/or surrogate as well as nursing, discussions with consultants, evaluation of patient's response to treatment, examination of  patient, obtaining history from patient or surrogate, ordering and performing treatments and interventions, ordering and review of laboratory studies, ordering and review of radiographic studies, pulse oximetry and re-evaluation of patient's condition.  Tessie Fass MSN, AGACNP-BC [facility] Pulmonary/Critical Care Medicine Amion for pager  02/07/2023, 9:36 AM

## 2023-02-08 DIAGNOSIS — J387 Other diseases of larynx: Secondary | ICD-10-CM | POA: Diagnosis not present

## 2023-02-08 LAB — GLUCOSE, CAPILLARY
Glucose-Capillary: 117 mg/dL — ABNORMAL HIGH (ref 70–99)
Glucose-Capillary: 249 mg/dL — ABNORMAL HIGH (ref 70–99)
Glucose-Capillary: 226 mg/dL — ABNORMAL HIGH (ref 70–99)
Glucose-Capillary: 139 mg/dL — ABNORMAL HIGH (ref 70–99)
Glucose-Capillary: 113 mg/dL — ABNORMAL HIGH (ref 70–99)

## 2023-02-08 LAB — CBC
HCT: 30.4 % — ABNORMAL LOW (ref 39.0–52.0)
Hemoglobin: 10.2 g/dL — ABNORMAL LOW (ref 13.0–17.0)
MCH: 31.3 pg (ref 26.0–34.0)
MCHC: 33.6 g/dL (ref 30.0–36.0)
MCV: 93.3 fL (ref 80.0–100.0)
Platelets: 306 10*3/uL (ref 150–400)
RBC: 3.26 MIL/uL — ABNORMAL LOW (ref 4.22–5.81)
RDW: 13.2 % (ref 11.5–15.5)
WBC: 8.7 10*3/uL (ref 4.0–10.5)
nRBC: 0 % (ref 0.0–0.2)

## 2023-02-08 LAB — BASIC METABOLIC PANEL
Anion gap: 11 (ref 5–15)
BUN: 20 mg/dL (ref 8–23)
CO2: 28 mmol/L (ref 22–32)
Calcium: 8.2 mg/dL — ABNORMAL LOW (ref 8.9–10.3)
Chloride: 101 mmol/L (ref 98–111)
Creatinine, Ser: 0.57 mg/dL — ABNORMAL LOW (ref 0.61–1.24)
GFR, Estimated: >60 mL/min (ref >60)
Glucose, Bld: 165 mg/dL — ABNORMAL HIGH (ref 70–99)
Potassium: 4.6 mmol/L (ref 3.5–5.1)
Sodium: 140 mmol/L (ref 135–145)

## 2023-02-08 LAB — PHOSPHORUS
Phosphorus: 3.6 mg/dL (ref 2.5–4.6)
Phosphorus: 4.2 mg/dL (ref 2.5–4.6)

## 2023-02-08 LAB — MAGNESIUM
Magnesium: 2 mg/dL (ref 1.7–2.4)
Magnesium: 2 mg/dL (ref 1.7–2.4)

## 2023-02-08 LAB — SURGICAL PATHOLOGY

## 2023-02-08 MED ORDER — DEXTROSE-SODIUM CHLORIDE 5-0.45 % IV SOLN: INTRAVENOUS | Status: DC

## 2023-02-08 MED ORDER — OXYCODONE HCL 5 MG PO TABS
5.0000 mg | ORAL_TABLET | ORAL | Status: DC | PRN
  Administered 2023-02-14: 5 mg
  Filled 2023-02-08: qty 1

## 2023-02-08 NOTE — Inpatient Diabetes Management (Signed)
Inpatient Diabetes Program Recommendations  AACE/ADA: New Consensus Statement on Inpatient Glycemic Control   Target Ranges:  Prepandial:   less than 140 mg/dL      Peak postprandial:   less than 180 mg/dL (1-2 hours)      Critically ill patients:  140 - 180 mg/dL    Latest Reference Range & Units 02/07/23 08:04 02/07/23 11:20 02/07/23 16:05 02/07/23 17:33 02/07/23 19:30 02/07/23 23:15 02/08/23 03:30 02/08/23 08:13  Glucose-Capillary 70 - 99 mg/dL 161 (H) 096 (H) 045 (H) 141 (H) 168 (H) 202 (H) 117 (H) 249 (H)   Review of Glycemic Control  Diabetes history: DM1 (does NOT make any insulin; requires basal, correction, and carb coverage insulin) Outpatient Diabetes medications: Medtronic insulin pump with Novolog insulin; total basal 20.7 units/day, 1:50 mg/dl correction, 4:09 grams carb coverage Current orders for Inpatient glycemic control: Levemir 8 units BID, Novolog 0-15 units Q4H, Novolog 3 units Q4H; Jevity @ 65 ml/hr  Inpatient Diabetes Program Recommendations:    Insulin: Please consider increasing Levemir to 10 units BID and tube feeding coverage to Novolog 5 units Q4H.  Thanks, Orlando Penner, RN, MSN, CDCES Diabetes Coordinator Inpatient Diabetes Program [phone] (Team Pager from 8am to 5pm)

## 2023-02-08 NOTE — Progress Notes (Signed)
Notified [facility] about pt on 6N19. Pt was ambulating to the BR and his NGT fell out. Dr. Marene Lenz notified of events and IR will put one in tomorrow.

## 2023-02-08 NOTE — Progress Notes (Signed)
Subjective: No new problems over night.  Objective: Vital signs in last 24 hours: Temp:  [98 F (36.7 C)-98.3 F (36.8 C)] 98.1 F (36.7 C) (06/17 1112) Pulse Rate:  [54-65] 64 (06/17 0738) Resp:  [15-21] 17 (06/17 0738) BP: (116-141)/(43-70) 126/47 (06/17 0500) SpO2:  [94 %-98 %] 94 % (06/17 0738) Weight:  [56.8 kg] 56.8 kg (06/17 0350) Weight change: -4.8 kg Last BM Date :  (pta)  Intake/Output from previous day: 06/16 0701 - 06/17 0700 In: 1787.2 [NG/GT:1737.2; IV Piggyback:50.1] Out: 1217 [Urine:1125; Drains:92] Intake/Output this shift: No intake/output data recorded.  PHYSICAL EXAM: Awake and alert. Breathing well, lary tube in place. Stoma inspected, clear and patent. Incisions clean, dry and intact, flaps are down. JP's with serous fluid, holding a good seal. Tongue looks healthy.   Lab Results: Recent Labs    02/07/23 0616 02/08/23 0648  WBC 8.8 8.7  HGB 9.8* 10.2*  HCT 29.3* 30.4*  PLT 273 306   BMET Recent Labs    02/07/23 0616 02/08/23 0648  NA 137 140  K 4.3 4.6  CL 100 101  CO2 25 28  GLUCOSE 279* 165*  BUN 20 20  CREATININE 0.65 0.57*  CALCIUM 8.2* 8.2*    Studies/Results: No results found.  Medications: I have reviewed the patient's current medications.  Assessment/Plan: Stable, progressing well. No infection or signs of fistula. Tolerating tube feeds. No BM yet. May transfer to [facility], will ask hospitalist service to follow for medical issues.  LOS: 3 days       [name] 02/08/2023, 11:32 AM

## 2023-02-08 NOTE — Progress Notes (Signed)
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 PCCM was consulted to assist with post-op medical management.   Pertinent  Medical History   Past Medical History:  Diagnosis Date   Cancer (HCC)    throat   Cigarette smoker    COPD (chronic obstructive pulmonary disease) (HCC)    Coronary artery disease    Diabetes mellitus    type 1  x 50 yrs   Diabetic retinopathy (HCC)    Dyslipidemia    Hypertension    Myocardial infarction [facility])    "a long time ago" from insulin pump   Peripheral vascular disease (HCC)      Significant Hospital Events: Including procedures, antibiotic start and stop dates in addition to other pertinent events   6/14 Laryngectomy, PCCM consulted for medical management  6/15-16 post op PT/OT/SLP, working to control CBGs.   Interim History / Subjective:   POD#3 I/O+ 4.3 L total BMP still pending this morning CBG 117-202 Room air, no pressors  Objective   Blood pressure (!) 126/47, pulse 64, temperature 98 F (36.7 C), temperature source Oral, resp. rate 17, height 5\' 11"  (1.803 m), weight 56.8 kg, SpO2 94 %.        Intake/Output Summary (Last 24 hours) at 02/08/2023 0759 Last data filed at 02/08/2023 0700 Gross per 24 hour  Intake 1787.24 ml  Output 1217 ml  Net 570.24 ml   Filed Weights   02/06/23 0500 02/07/23 0409 02/08/23 0350  Weight: 61.5 kg 61.6 kg 56.8 kg   Examination: General: Comfortable, no distress, chronically ill-appearing HENT: Surgical site clean and dry.  Lary  tube in place.  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 -Standard tracheostomy care -Continue current insulin management, plan to get back to his insulin pump once he is back on steady enteral nutrition -Restart aspirin and metoprolol when stabilized postoperatively -Bowel regimen -PT/OT/SLP -Sertraline as ordered  Should be able to go out of the ICU today 6/17.  Will confirm with ENT   Best Practice (right click and "Reselect all SmartList Selections" daily)   Diet/type: tubefeeds and NPO DVT prophylaxis: per primary  GI prophylaxis: N/A Lines: N/A Foley:  N/A Code Status:  full code Last date of multidisciplinary goals of care discussion [patient updated 6/17]   Labs   CBC: Recent Labs  Lab 02/01/23 1148 02/05/23 0824 02/06/23 0657 02/07/23 0616 02/08/23 0648  WBC 7.7  --  9.5 8.8 8.7  HGB 12.3* 8.8* 9.9* 9.8* 10.2*  HCT 36.9* 26.0* 29.6* 29.3* 30.4*  MCV 96.9  --  95.8 93.9 93.3  PLT 327  --  302 273 306    Basic Metabolic Panel: Recent Labs  Lab 02/01/23 1148 02/05/23 0824 02/05/23 1738 02/06/23 0657 02/06/23 1725 02/07/23 0616 02/07/23 1723  NA 135 135 134* 134*  --  137  --   K 4.7 4.7 4.0 3.9  --  4.3  --   CL 101 102 104 102  --  100  --   CO2 24  --  22 25  --  25  --   GLUCOSE 202* 214* 179* 81  --  279*  --   BUN 16  19 21 15   --  20  --   CREATININE 0.80 0.70 0.90 0.72  --  0.65  --   CALCIUM 9.2  --  8.3* 8.1*  --  8.2*  --   MG  --   --   --  1.8 2.3 1.9 1.9  PHOS  --   --   --  3.4 3.0 2.7 3.2   GFR: Estimated Creatinine Clearance: 69 mL/min (by C-G formula based on SCr of 0.65 mg/dL). Recent Labs  Lab 02/01/23 1148 02/06/23 0657 02/07/23 0616 02/08/23 0648  WBC 7.7 9.5 8.8 8.7    Liver Function Tests: No results for input(s): "AST", "ALT", "ALKPHOS", "BILITOT", "PROT", "ALBUMIN" in the last 168 hours. No results for input(s): "LIPASE", "AMYLASE" in the last 168 hours. No results for input(s): "AMMONIA" in the last 168 hours.  ABG    Component Value Date/Time   HCO3 26.5 12/09/2022 0607   TCO2 26 02/05/2023 0824   O2SAT 81.4 12/09/2022 0607     Coagulation Profile: No results for input(s): "INR", "PROTIME" in the last 168 hours.  Cardiac Enzymes: No results for input(s): "CKTOTAL", "CKMB", "CKMBINDEX", "TROPONINI" in the last 168 hours.  HbA1C: Hgb A1c MFr Bld  Date/Time Value Ref Range Status  12/09/2022 05:34 AM 8.0 (H) 4.8 - 5.6 % Final    Comment:    (NOTE) Pre diabetes:          5.7%-6.4%  Diabetes:              >6.4%  Glycemic control for   <7.0% adults with diabetes   11/14/2019 03:55 PM 8.0 (H) 4.8 - 5.6 % Final    Comment:    (NOTE) Pre diabetes:          5.7%-6.4% Diabetes:              >6.4% Glycemic control for   <7.0% adults with diabetes     CBG: Recent Labs  Lab 02/07/23 1605 02/07/23 1733 02/07/23 1930 02/07/23 2315 02/08/23 0330  GLUCAP 103* 141* 168* 202* 117*    Levy Pupa, MD, PhD 02/08/2023, 7:59 AM [facility] [phone] or if no answer before 7:00PM call [phone] For any issues after 7:00PM please call eLink [phone]

## 2023-02-08 NOTE — Evaluation (Signed)
Occupational Therapy Evaluation Patient Details Name: [name] MRN: [id number] DOB: Mar 04, 1951 Today's Date: 02/08/2023   History of Present Illness Pt is 72 year old presented to [facility] on  02/05/23 for laryngeal CA. Underwent total laryngectomy on 02/05/23 PMH - tracheostomy 12/10/22, type 1 diabetes mellitus, recent COPD diagnosis, coronary artery disease, hyperlipidemia, hypertension and L BKA   Clinical Impression   PTA, pt lives with significant other, typically Modified Independent with ADLs, IADLs and mobility with walking stick vs no AD. Pt presents now with minor deficits in dynamic standing balance and strength. Overall, pt able to mobilize around ICU unit using RW at min guard. VSS on RA throughout. Pt requires no more than Supervision for ADLs, able to manage prosthetic LE without assist today. Acute OT will follow briefly to ensure continued progress in strength/endurance though anticipate no OT needs at DC.      Recommendations for follow up therapy are one component of a multi-disciplinary discharge planning process, led by the attending physician.  Recommendations may be updated based on patient status, additional functional criteria and insurance authorization.   Assistance Recommended at Discharge PRN  Patient can return home with the following Assist for transportation    Functional Status Assessment  Patient has had a recent decline in their functional status and demonstrates the ability to make significant improvements in function in a reasonable and predictable amount of time.  Equipment Recommendations  None recommended by OT    Recommendations for Other Services       Precautions / Restrictions Precautions Precautions: Fall Restrictions Weight Bearing Restrictions: Yes      Mobility Bed Mobility Overal bed mobility: Modified Independent Bed Mobility: Supine to Sit                Transfers Overall transfer level: Needs assistance Equipment  used: Rolling walker (2 wheels) Transfers: Sit to/from Stand Sit to Stand: Min guard, From elevated surface           General transfer comment: requesting significantly elevated bed      Balance Overall balance assessment: Needs assistance Sitting-balance support: No upper extremity supported, Feet supported Sitting balance-Leahy Scale: Good     Standing balance support: Single extremity supported, Bilateral upper extremity supported, During functional activity Standing balance-Leahy Scale: Fair                             ADL either performed or assessed with clinical judgement   ADL Overall ADL's : Needs assistance/impaired Eating/Feeding: NPO   Grooming: Supervision/safety;Standing   Upper Body Bathing: Set up;Sitting   Lower Body Bathing: Supervison/ safety;Sit to/from stand   Upper Body Dressing : Set up;Sitting   Lower Body Dressing: Supervision/safety;Sit to/from stand Lower Body Dressing Details (indicate cue type and reason): able to don prosthetic LE and sketchers slip ins without assist Toilet Transfer: Min guard;Ambulation;Rolling walker (2 wheels)   Toileting- Clothing Manipulation and Hygiene: Supervision/safety;Sit to/from stand;Sitting/lateral lean       Functional mobility during ADLs: Min guard;Rolling walker (2 wheels) General ADL Comments: Moving fairly well though benefitting with RW use at this time     Vision Baseline Vision/History: 1 Wears glasses Ability to See in Adequate Light: 0 Adequate Patient Visual Report: No change from baseline Vision Assessment?: No apparent visual deficits     Perception     Praxis      Pertinent Vitals/Pain Pain Assessment Pain Assessment: No/denies pain     Hand  Dominance Right   Extremity/Trunk Assessment Upper Extremity Assessment Upper Extremity Assessment: Overall WFL for tasks assessed   Lower Extremity Assessment Lower Extremity Assessment: Defer to PT evaluation   Cervical  / Trunk Assessment Cervical / Trunk Assessment: Other exceptions;Normal Cervical / Trunk Exceptions: incision along lateral side of neck; difficulty with cervical rotation   Communication Communication Communication: Other (comment) (laryngectomy (writing primarily))   Cognition Arousal/Alertness: Awake/alert Behavior During Therapy: WFL for tasks assessed/performed, Impulsive Overall Cognitive Status: Within Functional Limits for tasks assessed                                 General Comments: appears frustrated by no food/drink allowed but likely at cognitive baseline. showing insight into deficits     General Comments  BP WFL pre and post activity, HR 80s    Exercises     Shoulder Instructions      Home Living Family/patient expects to be discharged to:: Private residence Living Arrangements: Spouse/significant other Available Help at Discharge: Family;Available 24 hours/day Type of Home: House Home Access: Ramped entrance     Home Layout: One level     Bathroom Shower/Tub: Chief Strategy Officer: Handicapped height Bathroom Accessibility: Yes How Accessible: Accessible via walker Home Equipment: Grab bars - tub/shower;Grab bars - toilet;Wheelchair - manual;Cane - single Librarian, academic (2 wheels);Rollator (4 wheels)   Additional Comments: Pt has been staying with significant other      Prior Functioning/Environment Prior Level of Function : Independent/Modified Independent             Mobility Comments: reports no AD but per PT eval, uses walking stick ADLs Comments: Indep with ADls/IADLs        OT Problem List: Decreased strength;Impaired balance (sitting and/or standing);Decreased activity tolerance      OT Treatment/Interventions: Self-care/ADL training;Therapeutic exercise;Energy conservation;DME and/or AE instruction;Therapeutic activities;Patient/family education    OT Goals(Current goals can be found in the care  plan section) Acute Rehab OT Goals Patient Stated Goal: have food/drink OT Goal Formulation: With patient Time For Goal Achievement: 02/22/23 Potential to Achieve Goals: Good  OT Frequency: Min 2X/week    Co-evaluation              AM-PAC OT "6 Clicks" Daily Activity     Outcome Measure Help from another person eating meals?: Total Help from another person taking care of personal grooming?: A Little Help from another person toileting, which includes using toliet, bedpan, or urinal?: A Little Help from another person bathing (including washing, rinsing, drying)?: A Little Help from another person to put on and taking off regular upper body clothing?: A Little Help from another person to put on and taking off regular lower body clothing?: A Little 6 Click Score: 16   End of Session Equipment Utilized During Treatment: Gait belt;Rolling walker (2 wheels) Nurse Communication: Mobility status  Activity Tolerance: Patient tolerated treatment well Patient left: in chair;with call bell/phone within reach  OT Visit Diagnosis: Muscle weakness (generalized) (M62.81)                Time: 1610-9604 OT Time Calculation (min): 37 min Charges:  OT General Charges $OT Visit: 1 Visit OT Evaluation $OT Eval Moderate Complexity: 1 Mod OT Treatments $Therapeutic Activity: 8-22 mins  [name], OTR/L Acute Rehab Services Office: [phone]   [name] 02/08/2023, 1:04 PM

## 2023-02-08 NOTE — Progress Notes (Signed)
Speech Language Pathology Treatment:  (laryngectomy treatment)  Patient Details Name: [name] MRN: [id number] DOB: [birth year] Today's Date: 02/08/2023 Time: 4098-1191 SLP Time Calculation (min) (ACUTE ONLY): 11 min  Assessment / Plan / Recommendation Clinical Impression  [name] was sitting in his recliner, mouthing words and supplementing communication with writing on dry erase board. He was able to obtain a free-standing mirror to be used when he places/removes HME.  He agreed to try using electrolarynx (EL) with intraoral adapter. He practiced using EL in various places in mouth to achieve best sound quality and minimize interference with articulators.  Encouraged him to practice in room. He prefers to use writing at this time. Will follow for ongoing education and assist to improve functionality of EL device.   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 Pollyann Kennedy).      SLP Plan  Continue with current plan of care      Recommendations for follow up therapy are one component of a multi-disciplinary discharge planning process, led by the attending physician.  Recommendations may be updated based on patient status, additional functional criteria and insurance authorization.    Recommendations   OP SLP indicated                  Oral care QID   Intermittent Supervision/Assistance Aphonia (R49.1)     Continue with current plan of care    [name] L. [name] Frederic, MA CCC/SLP Clinical Specialist - Acute Care SLP Acute Rehabilitation Services Office number [phone]  [name] [name]  02/08/2023, 1:59 PM

## 2023-02-08 NOTE — Progress Notes (Signed)
I introduced myself to Mr. Hechler and explained that I will be helping with laryngectomy supply teaching and planning for discharge.  He requested his HME be changed. I obtained an new HME from the pulmonary kit at the bedside. I removed his LaryTube with the help of Louis, primary RN, I cleaned the LaryTube with a tracheostomy care kit and sterile saline. I had Mr. Cammisa replace the LaryTube and practice placing his HME. He was able to place the tube but struggled with the HME - he will need a mirror he does not have to hold but can prop on his table so he can use both hands. He prefers at this time to communicate in writing and has a dry erase tablet and a e-writing tablet at the bedside.

## 2023-02-08 NOTE — Progress Notes (Signed)
Pt admitted to 6N19 from 38M and was oriented to room and equipment.  Vital signs:  98.0, 64, 129/63 Map82 and pt has continuous pulse ox on and operating.  Sats 95 at present.  Pt alert and oriented and communicating through communication boards.  Spero Geralds, CNS Resp, came up and reviewed all equipment and procedures with staff and pt.  Ambu bag in room, readily available.

## 2023-02-08 NOTE — Progress Notes (Signed)
Patient Services form faxed to [facility] for patient intake and laryngectomy supply management.

## 2023-02-08 NOTE — Progress Notes (Signed)
PT Cancellation Note  Patient Details Name: [name] MRN: [id number] DOB: 1951-05-15   Cancelled Treatment:    Reason Eval/Treat Not Completed: Fatigue/lethargy limiting ability to participate Delphina Cahill with OT just before PT arrival.) refused for today.   Bevelyn Buckles 02/08/2023, 2:28 PM Paddy Walthall M,PT Acute Rehab Services [phone]

## 2023-02-08 NOTE — Progress Notes (Signed)
Nutrition Follow-up  DOCUMENTATION CODES:   Severe malnutrition in context of chronic illness  INTERVENTION:   Continue tube feeds via NG tube: - Jevity 1.2 @ goal rate of 65 ml/hr (1560 ml/day) - PROSource TF20 60 ml daily  Tube feeding regimen provides 1952 kcal, 107 grams of protein, and 1259 ml of H2O.  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  Ongoing, being addressed via TF  GOAL:   Patient will meet greater than or equal to 90% of their needs  Met via TF at goal rate  MONITOR:   Diet advancement, Labs, Weight trends, TF tolerance  REASON FOR ASSESSMENT:   Consult Assessment of nutrition requirement/status, Enteral/tube feeding initiation and management  ASSESSMENT:   72 year old male who presented on 6/14 for procedure. PMH of CAD, PVD s/p L BKA, dyslipidemia, T1DM, HTN, MI, COPD, laryngeal tumor.  06/14 - s/p total laryngectomy, bilateral neck dissection, NG tube placement 06/15 - TF initiated  Discussed pt with RN and during ICU rounds. Pt tolerating tube feeds at goal rate with no issues. Discussed pt with ENT. Potential for diet advancement with some liquids later this week per ENT.  Spoke with pt at bedside. Pt communicates using a dry erase board and marker. Pt denies any issues with tube feeds at this time. Noted that pt has not yet had a BM. Per RN, pt has been refusing his senna but stated that he would take it today.  Pt asking about his weight. Pt endorses significant weight loss PTA.  Admit weight: 59.7 kg Current weight: 56.8 kg  Current TF: Jevity 1.2 @ 65 ml/hr, PROSource TF20 60 ml daily  Medications reviewed and include: colace, SSI q 4 hours, novolog 3 units q 4 hours, levemir 8 units BID, melatonin, senna  Labs reviewed. CBG's: 117-318 x 24 hours  UOP: 1125 ml x 24  hours R neck JP drain: 52 ml x 24 hours L neck JP drain: 40 ml x 24 hours I/O's: +4.3 L since admit  Diet Order:   Diet Order             Diet NPO time specified  Diet effective now                   EDUCATION NEEDS:   Education needs have been addressed  Skin:  Skin Assessment: Reviewed RN Assessment (incision to neck)  Last BM:  no documented BM  Height:   Ht Readings from Last 1 Encounters:  02/05/23 5\' 11"  (1.803 m)    Weight:   Wt Readings from Last 1 Encounters:  02/08/23 56.8 kg    BMI:  Body mass index is 17.46 kg/m.  Estimated Nutritional Needs:   Kcal:  1900-2100  Protein:  90-110 grams  Fluid:  1.9-2.1 L    Mertie Clause, MS, RD, LDN Inpatient Clinical Dietitian Please see AMiON for contact information.

## 2023-02-08 NOTE — TOC Initial Note (Addendum)
Transition of Care [facility]) - Initial/Assessment Note    Patient Details  Name: [name] MRN: [id number] Date of Birth: [birth year]  Transition [facility]) CM/SW Contact:    Tom-Johnson, Hershal Coria, RN Phone Number: 02/08/2023, 1:40 PM  Clinical Narrative:                  CM spoke with patient at bedside about needs for post hospital transition. Admitted for Laryngeal Mass, underwent Total Laryngectomy with bilateral selective Neck Dissection on 02/05/23. Has Laryngectomy tube. From home with Trach. Patient communicates via writing board. Patient will be getting his Laryngectomy supplies from [facility], supply store.   From home with partner. Has two supportive children living in [place]. Has a Rt BKA with Prosthesis. Has a w/c at home.  PCP is [name], MD and uses [facility] on [address].   Home Health recommended, patient states he has no preference. CM called in referral to [facility] and Tresa Endo noted acceptance, info on AVS.   Patient requested for more assistance at home. Patient does not have Medicaid and requested information about eligibility.  CM sent io to Physiological scientist for [employer] eligibility via Secure chat. Navigator follow up with him since he has primary insurance.   CM will continue to follow as patient progresses with care towards discharge.       Expected Discharge Plan: Home w Home Health Services Barriers to Discharge: Continued Medical Work up   Patient Goals and CMS Choice Patient states their goals for this hospitalization and ongoing recovery are:: To return home CMS Medicare.gov Compare Post Acute Care list provided to:: Patient Choice offered to / list presented to : Patient      Expected Discharge Plan and Services   Discharge Planning Services: CM Consult Post Acute Care Choice: Home Health Living arrangements for the past 2 months: Single Family Home                           HH  Arranged: PT, OT, Nurse's Aide, RN [facility] Agency: [facility] Date [facility] Agency Contacted: 02/08/23 Time HH Agency Contacted: [phone] Representative spoke with at [facility] Agency: Tresa Endo  Prior Living Arrangements/Services Living arrangements for the past 2 months: Single Family Home Lives with:: Significant Other Patient language and need for interpreter reviewed:: Yes Do you feel safe going back to the place where you live?: Yes      Need for Family Participation in Patient Care: Yes (Comment) Care giver support system in place?: Yes (comment) Current home services: DME (Trach, Rt prosthesis, W/C) Criminal Activity/Legal Involvement Pertinent to Current Situation/Hospitalization: No - Comment as needed  Activities of Daily Living      Permission Sought/Granted Permission sought to share information with : Case Manager, Family Supports, Magazine features editor Permission granted to share information with : Yes, Verbal Permission Granted              Emotional Assessment Appearance:: Appears stated age Attitude/Demeanor/Rapport: Engaged, Gracious Affect (typically observed): Accepting, Appropriate, Calm, Hopeful, Pleasant Orientation: : Oriented to Self, Oriented to Place, Oriented to  Time, Oriented to Situation Alcohol / Substance Use: Not Applicable Psych Involvement: No (comment)  Admission diagnosis:  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] Laryngeal cancer [facility]) [C32.9] Patient Active Problem List   Diagnosis Date Noted   Laryngeal mass 02/05/2023   S/P laryngectomy 02/05/2023   COPD (chronic obstructive pulmonary disease) (HCC) 01/11/2023   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  Protein-calorie malnutrition, severe (HCC) 12/15/2022   Postprocedural pneumothorax 12/11/2022   Acute respiratory failure with hypoxia (HCC) 12/11/2022   Tracheostomy status (HCC) 12/11/2022   Pressure injury of skin 12/11/2022   Hyponatremia 12/10/2022   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 12/09/2022    Adrenal adenoma, left 12/09/2022   Essential hypertension 12/09/2022   Dyspnea 11/27/2022   Elevated troponin 11/27/2022   Cough 10/21/2017   Wheezing 09/21/2017   Below-knee amputation of left lower extremity (HCC) 12/06/2015   Diabetic retinopathy associated with type 1 diabetes mellitus (HCC) 12/27/2014   Pleurodynia 11/23/2014   Type 1 diabetes mellitus on insulin therapy (HCC) 07/30/2014   Claudication in peripheral vascular disease (HCC) 07/30/2014   Male erectile disorder 08/10/2013   PVD (peripheral vascular disease) s/p R fem-pop bypass and L BKA 04/15/2011   Hyperlipidemia 04/15/2011   Polyneuropathy 10/11/2009   Carotid artery occlusion 06/27/2009   Tobacco user 06/18/2009   PCP:  [name], MD Pharmacy:   [id number] [address] - [place], [place] - [address] [place] [address] Phone: [phone] Fax: [phone]  [facility] [address] [place], [place] - [address] [place] [address] Phone: [phone] Fax: [phone]  [facility] [address] [place], [place] - [address] [place] [address] Phone: [phone] Fax: [phone]  [facility]) - [place], [place] - [address] [facility] [address] [place] [address] [place] [address] Phone: [phone] Fax: [phone]  [facility] - [place], [place] - [address] [place] [address] Phone: [phone] Fax: [phone]  [facility] [address] [place] [address] Phone: [phone] Fax: [phone]     Social Determinants of Health (SDOH) Social History: SDOH Screenings   Food Insecurity: No Food Insecurity (12/09/2022)  Housing: Low Risk  (12/09/2022)  Transportation Needs: No Transportation Needs (12/09/2022)  Utilities: Not At Risk (12/09/2022)   Depression (PHQ2-9): Low Risk  (01/04/2023)  Tobacco Use: Medium Risk (02/06/2023)   SDOH Interventions: Transportation Interventions: Intervention Not Indicated, Inpatient TOC, Patient Resources (Friends/Family)   Readmission Risk Interventions    02/08/2023    1:34 PM 12/17/2022    5:12 PM 12/17/2022    2:58 PM  Readmission Risk Prevention Plan  Transportation Screening Complete  Complete  PCP or Specialist Appt within 5-7 Days Complete    Home Care Screening Complete Complete Complete  Medication Review (RN CM) Referral to Pharmacy  Complete

## 2023-02-08 NOTE — Hospital Course (Addendum)
Mr. Solivan is a 72 y.o. M with hx smoking, laryngeal CA s/p tracheostomy, DM on insulin pump, PVD s/p left BKA in 2017, right fem-pop bypass in 2021, CAD never PCI, HLD, and HTN who presented for laryngectomy.  In April 2024 admitted for hoarseness, stridor, found to have laryngeal mass.  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  Doing well with tracheostomy since, presented 6/14 for elective total laryngectomy.  Hospitalist service consulted 6/17 for medical management now that he is transferring [facility]. 6/14: Admitted for laryngectomy 6/17: Transferred to the floor. 6/18: Overnight NG tube got dislodged, replaced by IR.  Back on basal bolus regimen for insulin and off of IV fluids.  **Interim History Blood sugars improved once Cortrak was discontinued.  Distress was consulted and assisted with further recommendations.  Patient will be medically stable for discharge and he recommended follow-up with PCP within 1 to 2 weeks.  ENT is planning on checking the patient on Friday for wound check and has instructed the patient's daughter on how to apply the pressure dressing patient is to remain on a strict liquid diet for now.  Assessment and Plan:  Laryngeal squamous cell cancer -S/p total laryngectomy and bilateral neck dissection on 6/14 by Dr. Pollyann Kennedy. Invasive moderately differentiated squamous cell carcinoma, negative margins and LNs.  Concern about air leak in pharyngeal closure but barium swallow without leak.  CXR without pneumothorax. -Tube feed and cortrack discontinued. -Full liquid diet resumed and will remain on it for now -Further care per ENT   T1DM on insulin pump:  -HbA1c 8.0% on last check.   -Now off tube feed on full liquid diet.  Anticipate improvement in CBG now he is off tube feed. -CBG and Glucose  Trend: Recent Labs  Lab 02/10/23 0051 02/10/23 0335 02/11/23 1028 02/11/23 1210 02/12/23 0552 02/12/23 0826 02/14/23 0353 02/14/23 0520 02/15/23 0230 02/15/23 0359 02/16/23 0038 02/16/23 0348 02/17/23 0024 02/17/23 0432 02/17/23 0753  GLUCOSE 166*  --  177*  --  112*  --  169*  --  190*  --  113*  --  191*  --   --   GLUCAP  --    < >  --    < >  --    < >  --    < >  --    < >  --    < >  --    < > 254*   < > = values in this interval not displayed.  -Change SSI and meal coverage from every 4 hours to ACHS. -Continue Semglee 20 units daily -Diabetic coordinator consulted for insulin pump   Acute Respiratory Failure with Hypoxia due to obstructing mass s/p tracheostomy. Chronic COPD -Stable. SpO2: 96 % O2 Flow Rate (L/min): 6 L/min FiO2 (%): 30 %; NOW currently on Room Air -Continue Arformoterol 15 mcg Neb BID, Budesonide 0.5 mg Neb BID, and DuoNeb 3 mL Neb BIDprn Wheezing and SOB   HTN -Normotensive without directed Tx. -Continue to Monitor BP per Protocol  -Last BP reading was 131/53    Depression: Stable -Continue SSRI with Sertraline 50 mg per Tube qHS   HLD:  -Continue Rosuvastatin 10 mg per Tube Daily    PVD, History of osteomyelitis s/p left BKA -Aspirin on hold per ENT.  Continue statin. -Continue routine vascular surgery follow up -Resume ASA per ENT  Severe Malnutrition Nutrition Status: Nutrition Problem: Severe Malnutrition Etiology: chronic illness (laryngeal tumor, COPD) Signs/Symptoms: severe fat depletion, severe muscle depletion, percent weight loss (10% weight loss in < 3 months) Percent weight loss: 10 % Interventions: Tube feeding, Refer to RD note for recommendations

## 2023-02-09 ENCOUNTER — Inpatient Hospital Stay (HOSPITAL_COMMUNITY): Payer: Medicare Other

## 2023-02-09 DIAGNOSIS — J387 Other diseases of larynx: Secondary | ICD-10-CM | POA: Diagnosis not present

## 2023-02-09 LAB — BASIC METABOLIC PANEL
Anion gap: 6 (ref 5–15)
BUN: 22 mg/dL (ref 8–23)
CO2: 28 mmol/L (ref 22–32)
Calcium: 8.2 mg/dL — ABNORMAL LOW (ref 8.9–10.3)
Chloride: 101 mmol/L (ref 98–111)
Creatinine, Ser: 0.57 mg/dL — ABNORMAL LOW (ref 0.61–1.24)
GFR, Estimated: >60 mL/min (ref >60)
Glucose, Bld: 92 mg/dL (ref 70–99)
Potassium: 4.1 mmol/L (ref 3.5–5.1)
Sodium: 135 mmol/L (ref 135–145)

## 2023-02-09 LAB — GLUCOSE, CAPILLARY
Glucose-Capillary: 190 mg/dL — ABNORMAL HIGH (ref 70–99)
Glucose-Capillary: 226 mg/dL — ABNORMAL HIGH (ref 70–99)
Glucose-Capillary: 234 mg/dL — ABNORMAL HIGH (ref 70–99)
Glucose-Capillary: 254 mg/dL — ABNORMAL HIGH (ref 70–99)
Glucose-Capillary: 269 mg/dL — ABNORMAL HIGH (ref 70–99)
Glucose-Capillary: 85 mg/dL (ref 70–99)

## 2023-02-09 MED ORDER — INSULIN GLARGINE-YFGN 100 UNIT/ML ~~LOC~~ SOLN
12.0000 [IU] | Freq: Every day | SUBCUTANEOUS | Status: DC
  Administered 2023-02-09: 12 [IU] via SUBCUTANEOUS
  Filled 2023-02-09 (×2): qty 0.12

## 2023-02-09 MED ORDER — LIDOCAINE VISCOUS HCL 2 % MT SOLN
15.0000 mL | Freq: Once | OROMUCOSAL | Status: AC
  Administered 2023-02-09: 1 mL via OROMUCOSAL

## 2023-02-09 NOTE — Care Management Important Message (Signed)
Important Message  Patient Details  Name: OBSIDIAN NGUYENTHI MRN: [id number] Date of Birth: [birth year]   Medicare Important Message Given:  Yes     Sherilyn Banker 02/09/2023, 2:03 PM

## 2023-02-09 NOTE — Progress Notes (Signed)
Inpatient Diabetes Program Recommendations  AACE/ADA: New Consensus Statement on Inpatient Glycemic Control (2015)  Target Ranges:  Prepandial:   less than 140 mg/dL      Peak postprandial:   less than 180 mg/dL (1-2 hours)      Critically ill patients:  140 - 180 mg/dL   Lab Results  Component Value Date   GLUCAP 234 (H) 02/09/2023   HGBA1C 8.0 (H) 12/09/2022    Review of Glycemic Control  Latest Reference Range & Units 02/09/23 00:14 02/09/23 04:54 02/09/23 07:47 02/09/23 12:05  Glucose-Capillary 70 - 99 mg/dL 85 161 (H) 096 (H) 045 (H)  Diabetes history: DM1 (does NOT make any insulin; requires basal, correction, and carb coverage insulin) Outpatient Diabetes medications: Medtronic insulin pump with Novolog insulin; total basal 20.7 units/day, 1:50 mg/dl correction, 4:09 grams carb coverage Current orders for Inpatient glycemic control:  Novolog moderate q 4 hours, Novolog 3 units q 4 hours Inpatient Diabetes Program Recommendations:    Note continuous feeds stopped overnight- scheduled for replacement today.  Recommend restarting basal insulin due to history of Type 1 DM.  Consider adding Semglee 12 units daily starting now.  Orders received.  Will follow.   Thanks,  Beryl Meager, RN, BC-ADM Inpatient Diabetes Coordinator Pager [phone]  (8a-5p)

## 2023-02-09 NOTE — Progress Notes (Signed)
Speech Language Pathology Treatment:  (laryngectomy treatment)  Patient Details Name: [name] MRN: [id number] DOB: [birth year] Today's Date: 02/09/2023 Time: 4098-1191 SLP Time Calculation (min) (ACUTE ONLY): 17 min  Assessment / Plan / Recommendation Clinical Impression  [name] was seen for ongoing education and electrolarynx (EL) practice after his laryngectomy.  He is reluctant to use the EL and required mod assist for handling, coordination of sound onset, and articulatory practice.  He is mouthing words and writing on communication board, writing today that eating is his highest priority right now.  We talked about plan to resume POs, hopefully this Friday, reviewed surgical changes to airway, and discussed the importance of SLP f/u post-discharge.  [name] agrees with plan.  SLP will follow.    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 Pollyann Kennedy).      SLP Plan  Continue with current plan of care      Recommendations for follow up therapy are one component of a multi-disciplinary discharge planning process, led by the attending physician.  Recommendations may be updated based on patient status, additional functional criteria and insurance authorization.    Recommendations   Will need OP SLP upon D/C due to expertise required with dx                  Oral care QID   Intermittent Supervision/Assistance [place] (R49.1)     Continue with current plan of care    [name] L. [name] Frederic, MA CCC/SLP Clinical Specialist - Acute Care SLP Acute Rehabilitation Services Office number [phone]  [name] [name]  02/09/2023, 4:06 PM

## 2023-02-09 NOTE — Progress Notes (Signed)
[facility] Consultation Progress Note  Patient: [name] ZOX:[id number]   PCP: [name], MD DOB: July 26, 1951   DOA: 02/05/2023   DOS: 02/09/2023   Date of Service: the patient was seen and examined on 02/09/2023 Primary service: Serena Colonel, MD   Brief hospital course: [name] is a 72 y.o. M with hx smoking, laryngeal CA s/p tracheostomy, DM on insulin pump, PVD s/p left BKA in 2017, right fem-pop bypass in 2021, CAD never PCI, HLD, and HTN who presented for laryngectomy.  In April 2024 admitted for hoarseness, stridor, found to have laryngeal mass.  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  Doing well with tracheostomy since, presented 6/14 for elective total laryngectomy.  Hospitalist service consulted 6/17 for medical management now that he is transferring [facility]. 6/14: Admitted for laryngectomy 6/17: Transferred to the floor. 6/18: Overnight NG tube got dislodged, replaced by IR.  Back on basal bolus regimen for insulin and off of IV fluids.   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 SP total laryngectomy and bilateral neck dissection on 6/14 by Dr. Pollyann Kennedy.. Margins negative. Lymph nodes negative for malignancy. Management per surgery. Currently has an NG tube for feeding placed by IR on 6/18. ENT surgery planning for diet advancement possibly on Friday.  History of COPD. Acute hypoxic respiratory failure secondary to obstructing mass SP trach. Patient was for tracheostomy education. Currently on room air. No evidence of exacerbation. Management per ENT.  HTN. Blood pressure stable.  Monitor.  Type 1 diabetes mellitus controlled with HLD with long-term insulin use. Currently on Lantus and sliding scale insulin with tube  feeding coverage. At home on insulin pump. Insulin pump currently on hold while NPO.  Mood disorder. On Zoloft. Continuing.  HLD. On Crestor. Continue current  PVD. Left leg BKA for osteomyelitis history. On 81 mg aspirin. Currently on hold. Resume once cleared by ENT. Outpatient follow-up with vascular surgery as needed. Has a prosthesis for left leg.  We will continue to follow the patient.   Subjective: No acute complaint.  No nausea or vomiting.  Communicating with the help of writing board.  Does not want feeding tube reinserted if it gets dislodged again due to pain of insertion  Objective: Vitals:   02/09/23 0800 02/09/23 1208 02/09/23 1540 02/09/23 1629  BP:    103/72  Pulse: (!) 56 62 65 63  Resp: 18 18 18 18   Temp:    98.2 F (36.8 C)  TempSrc:    Oral  SpO2: 97% 97% 98% 98%  Weight:      Height:        S1-S2 present. Clear to auscultation. Bowel sound present. No edema.  Left leg BKA  Family Communication: No one at bedside  Data Reviewed: Since last encounter, pertinent lab results CBC and BMP   . I have ordered test including CBC and BMP  . I have discussed pt's care plan and test results with speech therapy  .   Author: Lynden Oxford, MD 02/09/2023 5:57 PM  To reach On-call, see care teams to locate the attending and reach out to them via [email or web address]. If 7PM-7AM, please contact night-coverage If you still have difficulty reaching the attending provider, please page the [facility] (Director on Call) for [facility] on amion for assistance.

## 2023-02-09 NOTE — Progress Notes (Signed)
Spoke with Admin department at [facility], they confirmed receiving the patient service form that was faxed yesterday.

## 2023-02-09 NOTE — Progress Notes (Signed)
Subjective: Feeding tube accidentally came out last night.  Scheduled today for radiology to replace.  Otherwise doing well.  Objective: Vital signs in last 24 hours: Temp:  [97.9 F (36.6 C)-98.5 F (36.9 C)] 98.1 F (36.7 C) (06/18 0746) Pulse Rate:  [56-66] 56 (06/18 0800) Resp:  [16-21] 18 (06/18 0800) BP: (129-142)/(49-63) 138/58 (06/18 0746) SpO2:  [93 %-99 %] 97 % (06/18 0800) Weight change:  Last BM Date : 02/04/23 (Per pt)  Intake/Output from previous day: 06/17 0701 - 06/18 0700 In: 1356.8 [I.V.:651.8; NG/GT:705] Out: 86 [Drains:86] Intake/Output this shift: No intake/output data recorded.  PHYSICAL EXAM: Awake and alert.  Breathing well.  Stoma and flaps look excellent.  Drainage is serous, no salivary secretions.  Lab Results: Recent Labs    02/07/23 0616 02/08/23 0648  WBC 8.8 8.7  HGB 9.8* 10.2*  HCT 29.3* 30.4*  PLT 273 306   BMET Recent Labs    02/08/23 0648 02/09/23 0048  NA 140 135  K 4.6 4.1  CL 101 101  CO2 28 28  GLUCOSE 165* 92  BUN 20 22  CREATININE 0.57* 0.57*  CALCIUM 8.2* 8.2*    Studies/Results: No results found.  Medications: I have reviewed the patient's current medications.  Assessment/Plan: Stable postop.  Continue with nasogastric tube feeds after tube is replaced.  Anticipate starting oral liquids on Friday.  LOS: 4 days      Serena Colonel 02/09/2023, 8:44 AM

## 2023-02-09 NOTE — Progress Notes (Signed)
Physical Therapy Treatment Patient Details Name: [name] MRN: [id number] DOB: October 30, 1950 Today's Date: 02/09/2023   History of Present Illness Pt is 72 year old presented to [facility] on  02/05/23 for laryngeal CA. Underwent total laryngectomy on 02/05/23 PMH - tracheostomy 12/10/22, type 1 diabetes mellitus, recent COPD diagnosis, coronary artery disease, hyperlipidemia, hypertension and L BKA    PT Comments    Continuing work on functional mobility and activity tolerance;  Recieved pt sitting EOB, L prosthesis already on; session focused on progressive amb, and he walked well with RW and his L prosthesis; We discussed gentle neck ROM for better ROM with healing, and pt took time to communicate his concerns; He would like to walk more; this PT notified Mobility   Recommendations for follow up therapy are one component of a multi-disciplinary discharge planning process, led by the attending physician.  Recommendations may be updated based on patient status, additional functional criteria and insurance authorization.  Follow Up Recommendations       Assistance Recommended at Discharge Intermittent Supervision/Assistance  Patient can return home with the following A little help with walking and/or transfers;A little help with bathing/dressing/bathroom;Assistance with cooking/housework   Equipment Recommendations  None recommended by PT    Recommendations for Other Services       Precautions / Restrictions Precautions Precautions: Fall Precaution Comments: fall risk is present, minimized by use of RW Restrictions Weight Bearing Restrictions: No     Mobility  Bed Mobility               General bed mobility comments: EOB upon arrival; prosthesis already donned    Transfers Overall transfer level: Needs assistance Equipment used: Rolling walker (2 wheels) Transfers: Sit to/from Stand Sit to Stand: Min guard           General transfer comment: Much less need for  elevated bed today    Ambulation/Gait Ambulation/Gait assistance: Min guard (without physical contact) Gait Distance (Feet): 80 Feet Assistive device: Rolling walker (2 wheels) Gait Pattern/deviations: Step-through pattern Gait velocity: slowed (slowed down mostly by this PT and IV pole)     General Gait Details: Noted pt locking out elbows on RW, increased height for better fit; Seemed to enjoy walking   Stairs             Wheelchair Mobility    Modified Rankin (Stroke Patients Only)       Balance     Sitting balance-Leahy Scale: Good       Standing balance-Leahy Scale: Fair                              Cognition Arousal/Alertness: Awake/alert Behavior During Therapy: WFL for tasks assessed/performed, Impulsive Overall Cognitive Status: Within Functional Limits for tasks assessed                                          Exercises      General Comments General comments (skin integrity, edema, etc.): Took extra time to listen to pt's experience during this hospital stay; He indicated he wants to get up and walk more; discussed with Mobility specialist      Pertinent Vitals/Pain Pain Assessment Pain Assessment: Faces Pain Location: neck Pain Descriptors / Indicators: Grimacing (Stiffness) Pain Intervention(s): Monitored during session    Home Living  Prior Function            PT Goals (current goals can now be found in the care plan section) Acute Rehab PT Goals Patient Stated Goal: Wants to get his strength back; would like to walk daily PT Goal Formulation: With patient Time For Goal Achievement: 02/20/23 Potential to Achieve Goals: Good Progress towards PT goals: Progressing toward goals    Frequency    Min 3X/week      PT Plan Current plan remains appropriate;Other (comment) (May prgress well enough to not need HHPT)    Co-evaluation              AM-PAC PT "6  Clicks" Mobility   Outcome Measure  Help needed turning from your back to your side while in a flat bed without using bedrails?: None Help needed moving from lying on your back to sitting on the side of a flat bed without using bedrails?: None Help needed moving to and from a bed to a chair (including a wheelchair)?: A Little Help needed standing up from a chair using your arms (e.g., wheelchair or bedside chair)?: A Little Help needed to walk in hospital room?: A Little Help needed climbing 3-5 steps with a railing? : A Lot 6 Click Score: 19    End of Session Equipment Utilized During Treatment: Gait belt (and L prosthesis) Activity Tolerance: Patient tolerated treatment well Patient left: in bed;with call bell/phone within reach (readying to go to IR) Nurse Communication: Mobility status PT Visit Diagnosis: Unsteadiness on feet (R26.81);Other abnormalities of gait and mobility (R26.89);Muscle weakness (generalized) (M62.81)     Time: 6440-3474 PT Time Calculation (min) (ACUTE ONLY): 24 min  Charges:  $Gait Training: 8-22 mins $Therapeutic Activity: 8-22 mins                     Van Clines, PT  Acute Rehabilitation Services Office [phone] Secure Chat welcomed    [name] 02/09/2023, 5:04 PM

## 2023-02-10 DIAGNOSIS — J387 Other diseases of larynx: Secondary | ICD-10-CM | POA: Diagnosis not present

## 2023-02-10 LAB — CBC
HCT: 27.9 % — ABNORMAL LOW (ref 39.0–52.0)
Hemoglobin: 9.6 g/dL — ABNORMAL LOW (ref 13.0–17.0)
MCH: 31.6 pg (ref 26.0–34.0)
MCHC: 34.4 g/dL (ref 30.0–36.0)
MCV: 91.8 fL (ref 80.0–100.0)
Platelets: 330 10*3/uL (ref 150–400)
RBC: 3.04 MIL/uL — ABNORMAL LOW (ref 4.22–5.81)
RDW: 12.7 % (ref 11.5–15.5)
WBC: 6.1 10*3/uL (ref 4.0–10.5)
nRBC: 0 % (ref 0.0–0.2)

## 2023-02-10 LAB — BASIC METABOLIC PANEL
Anion gap: 5 (ref 5–15)
BUN: 17 mg/dL (ref 8–23)
CO2: 28 mmol/L (ref 22–32)
Calcium: 8.2 mg/dL — ABNORMAL LOW (ref 8.9–10.3)
Chloride: 98 mmol/L (ref 98–111)
Creatinine, Ser: 0.61 mg/dL (ref 0.61–1.24)
GFR, Estimated: >60 mL/min (ref >60)
Glucose, Bld: 166 mg/dL — ABNORMAL HIGH (ref 70–99)
Potassium: 4.7 mmol/L (ref 3.5–5.1)
Sodium: 131 mmol/L — ABNORMAL LOW (ref 135–145)

## 2023-02-10 LAB — GLUCOSE, CAPILLARY
Glucose-Capillary: 154 mg/dL — ABNORMAL HIGH (ref 70–99)
Glucose-Capillary: 161 mg/dL — ABNORMAL HIGH (ref 70–99)
Glucose-Capillary: 210 mg/dL — ABNORMAL HIGH (ref 70–99)
Glucose-Capillary: 275 mg/dL — ABNORMAL HIGH (ref 70–99)
Glucose-Capillary: 284 mg/dL — ABNORMAL HIGH (ref 70–99)
Glucose-Capillary: 87 mg/dL (ref 70–99)

## 2023-02-10 MED ORDER — INSULIN GLARGINE-YFGN 100 UNIT/ML ~~LOC~~ SOLN
16.0000 [IU] | Freq: Every day | SUBCUTANEOUS | Status: DC
  Administered 2023-02-10: 16 [IU] via SUBCUTANEOUS
  Filled 2023-02-10: qty 0.16

## 2023-02-10 MED ORDER — INSULIN GLARGINE-YFGN 100 UNIT/ML ~~LOC~~ SOLN: 20.0000 [IU] | Freq: Every day | SUBCUTANEOUS | Status: DC

## 2023-02-10 MED ORDER — INSULIN GLARGINE-YFGN 100 UNIT/ML ~~LOC~~ SOLN
18.0000 [IU] | Freq: Every day | SUBCUTANEOUS | Status: DC
  Administered 2023-02-11 – 2023-02-14 (×4): 18 [IU] via SUBCUTANEOUS
  Filled 2023-02-10 (×4): qty 0.18

## 2023-02-10 MED ORDER — INSULIN GLARGINE-YFGN 100 UNIT/ML ~~LOC~~ SOLN
4.0000 [IU] | Freq: Once | SUBCUTANEOUS | Status: AC
  Administered 2023-02-10: 4 [IU] via SUBCUTANEOUS
  Filled 2023-02-10: qty 0.04

## 2023-02-10 NOTE — Progress Notes (Signed)
OT Cancellation Note  Patient Details Name: [name] MRN: [id number] DOB: 15-Sep-1950   Cancelled Treatment:    Reason Eval/Treat Not Completed: Fatigue/lethargy limiting ability to participate (Pt just finished ambulating with RN, reports being tired. OT reassessed cervical ROM briefly, noted to be [facility]) OT to follow up with patient as able  02/10/2023  AB, OTR/L  Acute Rehabilitation Services  Office: [phone]   [name] 02/10/2023, 2:50 PM

## 2023-02-10 NOTE — Progress Notes (Signed)
Physical Therapy Treatment Patient Details Name: [name] MRN: [id number] DOB: 02/20/1951 Today's Date: 02/10/2023   History of Present Illness Pt is 72 year old presented to [facility] on  02/05/23 for laryngeal CA. Underwent total laryngectomy on 02/05/23 PMH - tracheostomy 12/10/22, type 1 diabetes mellitus, recent COPD diagnosis, coronary artery disease, hyperlipidemia, hypertension and L BKA    PT Comments    Continuing work on functional mobility and activity tolerance;  Session focused on progressive amb, with excellent incr distance and activity tolerance; Pt opted to use the RW today -- will encourage him to wean off of RW-- perhaps to cane, or without assistive device;   Neck discomfort persists; Provided pt with 2 neck pillows; hopeful that they will help at least with sleep positioning perhaps   Recommendations for follow up therapy are one component of a multi-disciplinary discharge planning process, led by the attending physician.  Recommendations may be updated based on patient status, additional functional criteria and insurance authorization.  Follow Up Recommendations       Assistance Recommended at Discharge Intermittent Supervision/Assistance  Patient can return home with the following A little help with walking and/or transfers;A little help with bathing/dressing/bathroom;Assistance with cooking/housework   Equipment Recommendations  None recommended by PT    Recommendations for Other Services       Precautions / Restrictions Precautions Precautions: Fall Precaution Comments: fall risk is present, minimized by use of RW Restrictions Weight Bearing Restrictions: No     Mobility  Bed Mobility Overal bed mobility: Modified Independent Bed Mobility: Supine to Sit                Transfers Overall transfer level: Needs assistance Equipment used: Rolling walker (2 wheels) Transfers: Sit to/from Stand Sit to Stand: Supervision           General  transfer comment: tends to let go of RW early and take a few steps without RW to get to chair    Ambulation/Gait Ambulation/Gait assistance: Min guard (without physical contact) Gait Distance (Feet): 250 Feet Assistive device: Rolling walker (2 wheels) Gait Pattern/deviations: Step-through pattern Gait velocity: a bit quick     General Gait Details: Brought a cane to try, but pt choosing to walk with RW today; walking at a quick pace, but did not overtly lose control   Stairs             Wheelchair Mobility    Modified Rankin (Stroke Patients Only)       Balance     Sitting balance-Leahy Scale: Good                                      Cognition Arousal/Alertness: Awake/alert Behavior During Therapy: WFL for tasks assessed/performed, Impulsive Overall Cognitive Status: Within Functional Limits for tasks assessed                                          Exercises      General Comments General comments (skin integrity, edema, etc.): Gave pt a new dry erase marker; used the Notes app on this PT's phone to allow pt to type for communication; also provided pt with Charge Nurse's number, which is a smart phone that he can send text messagesto      Pertinent Vitals/Pain Pain Assessment Pain  Assessment: Faces Faces Pain Scale: Hurts a little bit Pain Location: neck Pain Descriptors / Indicators: Grimacing Pain Intervention(s): Monitored during session    Home Living                          Prior Function            PT Goals (current goals can now be found in the care plan section) Acute Rehab PT Goals Patient Stated Goal: Wants to get his strength back; would like to walk daily PT Goal Formulation: With patient Time For Goal Achievement: 02/20/23 Potential to Achieve Goals: Good Progress towards PT goals: Progressing toward goals    Frequency    Min 3X/week      PT Plan Current plan remains  appropriate;Other (comment) (May prgress well enough to not need HHPT)    Co-evaluation              AM-PAC PT "6 Clicks" Mobility   Outcome Measure  Help needed turning from your back to your side while in a flat bed without using bedrails?: None Help needed moving from lying on your back to sitting on the side of a flat bed without using bedrails?: None Help needed moving to and from a bed to a chair (including a wheelchair)?: A Little Help needed standing up from a chair using your arms (e.g., wheelchair or bedside chair)?: A Little Help needed to walk in hospital room?: A Little Help needed climbing 3-5 steps with a railing? : A Lot 6 Click Score: 19    End of Session Equipment Utilized During Treatment: Gait belt (and L prosthesis) Activity Tolerance: Patient tolerated treatment well Patient left: in bed;with call bell/phone within reach Nurse Communication: Mobility status PT Visit Diagnosis: Unsteadiness on feet (R26.81);Other abnormalities of gait and mobility (R26.89);Muscle weakness (generalized) (M62.81)     Time: 1610-9604 PT Time Calculation (min) (ACUTE ONLY): 28 min  Charges:  $Gait Training: 23-37 mins                     Van Clines, PT  Acute Rehabilitation Services Office [phone] Secure Chat welcomed    [name] 02/10/2023, 5:24 PM

## 2023-02-10 NOTE — Progress Notes (Signed)
Subjective: No new problems, doing well.  Followed by [facility].  Objective: Vital signs in last 24 hours: Temp:  [98 F (36.7 C)-98.5 F (36.9 C)] 98 F (36.7 C) (06/19 0818) Pulse Rate:  [60-67] 61 (06/19 0818) Resp:  [17-20] 17 (06/19 0818) BP: (103-136)/(50-72) 130/50 (06/19 0818) SpO2:  [95 %-98 %] 95 % (06/19 1124) Weight:  [57.1 kg] 57.1 kg (06/19 0343) Weight change:  Last BM Date : 02/07/23  Intake/Output from previous day: 06/18 0701 - 06/19 0700 In: 0  Out: 995 [Urine:950; Drains:45] Intake/Output this shift: No intake/output data recorded.  PHYSICAL EXAM: Awake and alert.  His significant other is in the room.  He is breathing well.  The stoma looks excellent.  Neck flaps are flat.  Serous drainage in both drains.  No evidence of saliva.  Lab Results: Recent Labs    02/08/23 0648 02/10/23 0051  WBC 8.7 6.1  HGB 10.2* 9.6*  HCT 30.4* 27.9*  PLT 306 330   BMET Recent Labs    02/09/23 0048 02/10/23 0051  NA 135 131*  K 4.1 4.7  CL 101 98  CO2 28 28  GLUCOSE 92 166*  BUN 22 17  CREATININE 0.57* 0.61  CALCIUM 8.2* 8.2*    Studies/Results: DG Naso G Tube Plc W/Fl W/Rad  Result Date: 02/09/2023 INDICATION: 72 year old male with obstructing mass in the upper airway status post emergent tracheostomy on 12/10/2022 and total laryngectomy on 02/05/2023 presents for fluoro guided feeding tube placement. EXAM: NASO G TUBE PLACEMENT WITH FL AND WITH RAD COMPARISON:  None Available. CONTRAST:  None FLUOROSCOPY TIME:  2.7 mGy. COMPLICATIONS: None immediate PROCEDURE: The Dobhoff tube was lubricated with viscous lidocaine inserted into the left nostril. Under intermittent fluoroscopic guidance, the Dobhoff tube was advanced into the stomach. Attempted to advance the Dobhoff, however, patient could not tolerate. Decision was made to leave the Dobhoff tube tip in the stomach as patient does not have an absolute indication for post pyloric feeding such as gastric  outlet obstruction. A spot fluoroscopic image was saved for documentation purposes. The tube was affixed to the patient's nose with tape. The patient tolerated the procedure well without immediate postprocedural complication. IMPRESSION: Successful fluoroscopic guided placement of Dobhoff tube with tip terminating in the gastric fundus. The tube is ready for immediate use. This procedure was performed by Lawernce Ion, PA-C under supervision of Carey Bullocks, MD. Electronically Signed   By: Carey Bullocks M.D.   On: 02/09/2023 14:14    Medications: I have reviewed the patient's current medications.  Assessment/Plan: Stable postop, progressing well.  Drainage output still little bit high.  Perhaps tomorrow 1 or 2 drains can be removed.  Anticipate starting oral liquids on Friday.  LOS: 5 days     [name] 02/10/2023, 12:54 PM

## 2023-02-10 NOTE — Inpatient Diabetes Management (Signed)
Inpatient Diabetes Program Recommendations  AACE/ADA: New Consensus Statement on Inpatient Glycemic Control (2015)  Target Ranges:  Prepandial:   less than 140 mg/dL      Peak postprandial:   less than 180 mg/dL (1-2 hours)      Critically ill patients:  140 - 180 mg/dL   Lab Results  Component Value Date   GLUCAP 284 (H) 02/10/2023   HGBA1C 8.0 (H) 12/09/2022   Diabetes history: DM1 (does NOT make any insulin; requires basal, correction, and carb coverage insulin) Outpatient Diabetes medications: Medtronic insulin pump with Novolog insulin; total basal 20.7 units/day, 1:50 mg/dl correction, 1:61 grams carb coverage Current orders for Inpatient glycemic control:  Novolog moderate q 4 hours, Novolog 3 units q 4 hours Semglee 16 units daily Jevity 65 ml/hr Inpatient Diabetes Program Recommendations:    Note blood sugars continue to be >goal.  Consider increasing Semglee to 20 units daily and consider increasing Novolog tube feed coverage to 4 units q 4 hours.  Will follow.   Thanks  Beryl Meager, RN, BC-ADM Inpatient Diabetes Coordinator Pager [phone]  (8a-5p)

## 2023-02-10 NOTE — Progress Notes (Signed)
[facility] Consultation Progress Note  Patient: [name] ZOX:[id number]   PCP: [name], MD DOB: Jan 14, 1951   DOA: 02/05/2023   DOS: 02/10/2023   Date of Service: the patient was seen and examined on 02/10/2023 Primary service: Serena Colonel, MD   Brief hospital course: Mr. [name] is a 72 y.o. M with hx smoking, laryngeal CA s/p tracheostomy, DM on insulin pump, PVD s/p left BKA in 2017, right fem-pop bypass in 2021, CAD never PCI, HLD, and HTN who presented for laryngectomy.  In April 2024 admitted for hoarseness, stridor, found to have laryngeal mass.  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  Doing well with tracheostomy since, presented 6/14 for elective total laryngectomy.  Hospitalist service consulted 6/17 for medical management now that he is transferring OOU. 6/14: Admitted for laryngectomy 6/17: Transferred to the floor. 6/18: Overnight NG tube got dislodged, replaced by IR.  Back on basal bolus regimen for insulin and off of IV fluids.   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 SP total laryngectomy and bilateral neck dissection on 6/14 by Dr. Pollyann Kennedy.. Margins negative. Lymph nodes negative for malignancy. Management per surgery. Currently has an NG tube for feeding placed by IR on 6/18. ENT surgery planning for diet advancement possibly on Friday.  History of COPD. Acute hypoxic respiratory failure secondary to obstructing mass SP trach. Patient was for tracheostomy education. Currently on room air. No evidence of exacerbation. Management per ENT.  HTN. Blood pressure stable.  Monitor.  Type 1 diabetes mellitus controlled with HLD with long-term insulin use. Currently on Lantus and sliding scale insulin with tube  feeding coverage. At home on insulin pump. Insulin pump currently on hold while NPO. Patient was on 12 units of Semglee on 6/18.  I ordered 16 units of Semglee.  At the recommendation, I ordered 4 more units of Semglee to increase it up to 20 units total in a day. Given the episode of the hypoglycemia I will lower the Semglee to 18 units.  Mood disorder. On Zoloft. Continuing.  HLD. On Crestor. Continue current  PVD. Left leg BKA for osteomyelitis history. On 81 mg aspirin. Currently on hold. Resume once cleared by ENT. Outpatient follow-up with vascular surgery as needed. Has a prosthesis for left leg.  We will continue to follow the patient.   Subjective: No acute pain.  No nausea or vomiting.  Objective: Vitals:   02/10/23 0818 02/10/23 1124 02/10/23 1435 02/10/23 1647  BP: (!) 130/50   (!) 122/40  Pulse: 61  76 (!) 58  Resp: 17  17 18   Temp: 98 F (36.7 C)   98.6 F (37 C)  TempSrc: Oral   Oral  SpO2: 95% 95% 95% 97%  Weight:      Height:        S1-S2 present. Clear to auscultation. No edema.  Family Communication: Family at bedside  Data Reviewed: Reviewed CBC and BMP.  Reordered CBC and BMP.  Author: Lynden Oxford, MD 02/10/2023 6:48 PM  To reach On-call, see care teams to locate the attending and reach out to them via [email or web address]. If 7PM-7AM, please contact night-coverage If you still have difficulty reaching the attending provider, please page the [facility] (Director on Call) for [facility] on amion for assistance.

## 2023-02-11 DIAGNOSIS — J387 Other diseases of larynx: Secondary | ICD-10-CM | POA: Diagnosis not present

## 2023-02-11 LAB — BASIC METABOLIC PANEL
Anion gap: 9 (ref 5–15)
BUN: 17 mg/dL (ref 8–23)
CO2: 24 mmol/L (ref 22–32)
Calcium: 8.5 mg/dL — ABNORMAL LOW (ref 8.9–10.3)
Chloride: 99 mmol/L (ref 98–111)
Creatinine, Ser: 0.7 mg/dL (ref 0.61–1.24)
GFR, Estimated: >60 mL/min (ref >60)
Glucose, Bld: 177 mg/dL — ABNORMAL HIGH (ref 70–99)
Potassium: 4.9 mmol/L (ref 3.5–5.1)
Sodium: 132 mmol/L — ABNORMAL LOW (ref 135–145)

## 2023-02-11 LAB — GLUCOSE, CAPILLARY
Glucose-Capillary: 100 mg/dL — ABNORMAL HIGH (ref 70–99)
Glucose-Capillary: 113 mg/dL — ABNORMAL HIGH (ref 70–99)
Glucose-Capillary: 146 mg/dL — ABNORMAL HIGH (ref 70–99)
Glucose-Capillary: 190 mg/dL — ABNORMAL HIGH (ref 70–99)
Glucose-Capillary: 220 mg/dL — ABNORMAL HIGH (ref 70–99)
Glucose-Capillary: 259 mg/dL — ABNORMAL HIGH (ref 70–99)

## 2023-02-11 MED ORDER — INSULIN ASPART 100 UNIT/ML IJ SOLN
4.0000 [IU] | INTRAMUSCULAR | Status: DC
  Administered 2023-02-11 – 2023-02-14 (×13): 4 [IU] via SUBCUTANEOUS

## 2023-02-11 NOTE — Progress Notes (Signed)
Subjective: Doing well, no new problems.  Objective: Vital signs in last 24 hours: Temp:  [98.4 F (36.9 C)-98.7 F (37.1 C)] 98.6 F (37 C) (06/20 0802) Pulse Rate:  [57-76] 57 (06/20 0802) Resp:  [16-18] 17 (06/20 0802) BP: (122-140)/(40-73) 140/55 (06/20 0802) SpO2:  [93 %-98 %] 97 % (06/20 0802) Weight:  [55.9 kg] 55.9 kg (06/20 0422) Weight change: -1.2 kg Last BM Date : 02/10/23  Intake/Output from previous day: 06/19 0701 - 06/20 0700 In: -  Out: 560 [Urine:500; Drains:60] Intake/Output this shift: No intake/output data recorded.  PHYSICAL EXAM: Awake and alert.  Breathing well.  Stoma and neck flaps look excellent.  Lab Results: Recent Labs    02/10/23 0051  WBC 6.1  HGB 9.6*  HCT 27.9*  PLT 330   BMET Recent Labs    02/09/23 0048 02/10/23 0051  NA 135 131*  K 4.1 4.7  CL 101 98  CO2 28 28  GLUCOSE 92 166*  BUN 22 17  CREATININE 0.57* 0.61  CALCIUM 8.2* 8.2*    Studies/Results: DG Naso G Tube Plc W/Fl W/Rad  Result Date: 02/09/2023 INDICATION: 72 year old male with obstructing mass in the upper airway status post emergent tracheostomy on 12/10/2022 and total laryngectomy on 02/05/2023 presents for fluoro guided feeding tube placement. EXAM: NASO G TUBE PLACEMENT WITH FL AND WITH RAD COMPARISON:  None Available. CONTRAST:  None FLUOROSCOPY TIME:  2.7 mGy. COMPLICATIONS: None immediate PROCEDURE: The Dobhoff tube was lubricated with viscous lidocaine inserted into the left nostril. Under intermittent fluoroscopic guidance, the Dobhoff tube was advanced into the stomach. Attempted to advance the Dobhoff, however, patient could not tolerate. Decision was made to leave the Dobhoff tube tip in the stomach as patient does not have an absolute indication for post pyloric feeding such as gastric outlet obstruction. A spot fluoroscopic image was saved for documentation purposes. The tube was affixed to the patient's nose with tape. The patient tolerated the  procedure well without immediate postprocedural complication. IMPRESSION: Successful fluoroscopic guided placement of Dobhoff tube with tip terminating in the gastric fundus. The tube is ready for immediate use. This procedure was performed by Lawernce Ion, PA-C under supervision of Carey Bullocks, MD. Electronically Signed   By: Carey Bullocks M.D.   On: 02/09/2023 14:14    Medications: I have reviewed the patient's current medications.  Assessment/Plan: Progressing very nicely.  Right drain removed today.  Remove left drain possibly tomorrow.  Plan:  1.  Start oral liquids tomorrow.  If he does well and there is no signs of leak or difficulty swallowing then after a couple of days we can remove the nasogastric tube.  2.  Remove left drain tomorrow or Saturday.  3.  If all goes well, anticipate discharge home on Monday on full oral liquids.  LOS: 6 days      Serena Colonel 02/11/2023, 8:27 AM

## 2023-02-11 NOTE — Progress Notes (Signed)
Prescription form for Total Laryngectomy supplies signed by Dr. Pollyann Kennedy and faxed to [facility] this morning. [employer] will reach out to Mr. [name]'s friend/partner, [name], to arrange ongoing supplies. Mr. [name] will be sent home with approximately 40 HMEs (day and night HMEs which can be used interchangeably), a shower guard, multiple neck bands, and an extra lary tube, an electrolarynx with oral adapter and charger, and an e-writing tablet.  He is able to remove and replace his Lary tube and HME using a mirror. I have shown him how to clean his lary tube. He knows to change his HME daily, and I have reviewed the above components with him and their purpose.

## 2023-02-11 NOTE — Progress Notes (Signed)
[facility] Consultation Progress Note  Patient: [name] ZOX:[id number]   PCP: [name], MD DOB: 12-18-1950   DOA: 02/05/2023   DOS: 02/11/2023   Date of Service: the patient was seen and examined on 02/11/2023 Primary service: [name] Colonel, MD   Brief hospital course: [name] is a 72 y.o. M with hx smoking, laryngeal CA s/p tracheostomy, DM on insulin pump, PVD s/p left BKA in 2017, right fem-pop bypass in 2021, CAD never PCI, HLD, and HTN who presented for laryngectomy.  In April 2024 admitted for hoarseness, stridor, found to have laryngeal mass.  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  Doing well with tracheostomy since, presented 6/14 for elective total laryngectomy.  Hospitalist service consulted 6/17 for medical management now that he is transferring OOU. 6/14: Admitted for laryngectomy 6/17: Transferred to the floor. 6/18: Overnight NG tube got dislodged, replaced by IR.  Back on basal bolus regimen for insulin and off of IV fluids.  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 SP total laryngectomy and bilateral neck dissection on 6/14 by Dr. Pollyann Kennedy.. Margins negative. Lymph nodes negative for malignancy. Management per surgery. Currently has an NG tube for feeding placed by IR on 6/18. ENT surgery planning for diet advancement on Friday.  History of COPD. Acute hypoxic respiratory failure secondary to obstructing mass SP trach. Patient was for tracheostomy education. Currently on room air. No evidence of exacerbation. Management per ENT.  HTN. Blood pressure stable.  Monitor.  Type 1 diabetes mellitus controlled with HLD with long-term insulin use. Currently on Lantus and sliding scale insulin with tube feeding  coverage. At home on insulin pump. Insulin pump currently on hold while NPO.  Mood disorder. On Zoloft. Continuing.  HLD. On Crestor. Continue current  PVD. Left leg BKA for osteomyelitis history. On 81 mg aspirin. Currently on hold. Resume once cleared by ENT. Outpatient follow-up with vascular surgery as needed. Has a prosthesis for left leg.  We will continue to follow the patient.   Subjective: No acute complaint.  Few episodes of hypoglycemia yesterday.  Blood sugars reasonable for now.  Pain well-controlled.  Objective: Vitals:   02/11/23 0828 02/11/23 1130 02/11/23 1621 02/11/23 1716  BP:    (!) 112/56  Pulse: 63 64 68 63  Resp: 18 18 16 18   Temp:    98.4 F (36.9 C)  TempSrc:    Oral  SpO2: 98%   98%  Weight:      Height:        S1-S2 present. Clear to auscultation. Bowel sound present. No edema.  Family Communication: Daughter at bedside  Data Reviewed: Reordered CBC and BMP.    Author: Lynden Oxford, MD 02/11/2023 6:23 PM  To reach On-call, see care teams to locate the attending and reach out to them via [email or web address]. If 7PM-7AM, please contact night-coverage If you still have difficulty reaching the attending provider, please page the [facility] (Director on Call) for [facility] on amion for assistance.

## 2023-02-11 NOTE — Progress Notes (Signed)
Occupational Therapy Treatment Patient Details Name: [name] MRN: [id number] DOB: 08-21-1951 Today's Date: 02/11/2023   History of present illness Pt is 72 year old presented [facility] on  02/05/23 for laryngeal CA. Underwent total laryngectomy on 02/05/23 [facility] - tracheostomy 12/10/22, type 1 diabetes mellitus, recent COPD diagnosis, coronary artery disease, hyperlipidemia, hypertension and L BKA   OT comments  Pt tolerated session well. Pt daughter present during session. Pt able to ambulate in hall with supervision using RW, fair activity tolerance, no LOB. Pt not able to safely gather clothes needed for dressing or transfer objects during ambulation due to decreased balance, dependent on RW for support. Pt able to stand at sink and perform hand hygiene unsupported briefly. Pt had difficulty with transfer from bed to recliner without RW, used furniture for balance, decreased safety awareness. Pt determined to return home without need for RW, instructed using RW for now would be more safe until strength returns.  Pt displays RTC tears to R shoulder, states it's been years, shoulder stiffness with decreased AROM R shoulder flexion. Pt instructed on BUE exercises using [place] TB, would benefit from further education on exercise for form/technique.  Pt DC plan still appropriate, not anticipated to require HHOT at this time, would beenfit from continued acute OT to improve activity tolerance and safety awareness to progress as able to with ADL/IADLs.   Recommendations for follow up therapy are one component of a multi-disciplinary discharge planning process, led by the attending physician.  Recommendations may be updated based on patient status, additional functional criteria and insurance authorization.    Assistance Recommended at Discharge PRN  Patient can return home with the following  Assist for transportation   Equipment Recommendations  None recommended by OT    Recommendations for Other  Services      Precautions / Restrictions Precautions Precautions: Fall Precaution Comments: fall risk is present, minimized by use of RW Restrictions Weight Bearing Restrictions: No       Mobility Bed Mobility Overal bed mobility: Modified Independent                  Transfers Overall transfer level: Needs assistance Equipment used: Rolling walker (2 wheels) Transfers: Sit to/from Stand Sit to Stand: Supervision     Step pivot transfers: Supervision     General transfer comment: Pt did not use RW to get to chair, uses furniture for balance, decreased safety awareness, would benefit from consistent RW use at this time     Balance Overall balance assessment: Needs assistance Sitting-balance support: No upper extremity supported, Feet supported Sitting balance-Leahy Scale: Good     Standing balance support: Single extremity supported, Bilateral upper extremity supported, During functional activity Standing balance-Leahy Scale: Fair Standing balance comment: RW for support, decreased balance and safety standing without RW                           ADL either performed or assessed with clinical judgement   ADL                                              Extremity/Trunk Assessment              Vision       Perception     Praxis      Cognition Arousal/Alertness: Awake/alert  Behavior During Therapy: WFL for tasks assessed/performed Overall Cognitive Status: Within Functional Limits for tasks assessed                                          Exercises      Shoulder Instructions       General Comments      Pertinent Vitals/ Pain       Pain Assessment Pain Assessment: No/denies pain  Home Living                                          Prior Functioning/Environment              Frequency  Min 2X/week        Progress Toward Goals  OT Goals(current goals can  now be found in the care plan section)  Progress towards OT goals: Progressing toward goals  Acute Rehab OT Goals Patient Stated Goal: to return home at [facility] OT Goal Formulation: With patient Time For Goal Achievement: 02/22/23 Potential to Achieve Goals: Good ADL Goals Pt/caregiver will Perform Home Exercise Program: Increased strength;Both right and left upper extremity;With theraband;Independently;With written HEP provided Additional ADL Goal #1: Pt to demo ability to gather ADL/IADL items MOD I without LOB or safety concerns Additional ADL Goal #2: Pt to increase standing activity tolerance > 10 min without seated rest break  Plan Discharge plan remains appropriate    Co-evaluation                 AM-PAC OT "6 Clicks" Daily Activity     Outcome Measure   Help from another person eating meals?: Total Help from another person taking care of personal grooming?: A Little Help from another person toileting, which includes using toliet, bedpan, or urinal?: A Little Help from another person bathing (including washing, rinsing, drying)?: A Little Help from another person to put on and taking off regular upper body clothing?: A Little Help from another person to put on and taking off regular lower body clothing?: A Little 6 Click Score: 16    End of Session Equipment Utilized During Treatment: Rolling walker (2 wheels)  OT Visit Diagnosis: Muscle weakness (generalized) (M62.81)   Activity Tolerance Patient tolerated treatment well   Patient Left in chair;with call bell/phone within reach;with family/visitor present   Nurse Communication Mobility status        Time: 1610-9604 OT Time Calculation (min): 18 min  Charges: OT General Charges $OT Visit: 1 Visit OT Treatments $Therapeutic Activity: 8-22 mins  Brandii Lakey, OTR/L   Alexis Goodell 02/11/2023, 11:21 AM

## 2023-02-12 DIAGNOSIS — J387 Other diseases of larynx: Secondary | ICD-10-CM | POA: Diagnosis not present

## 2023-02-12 LAB — BASIC METABOLIC PANEL
Anion gap: 7 (ref 5–15)
BUN: 22 mg/dL (ref 8–23)
CO2: 27 mmol/L (ref 22–32)
Calcium: 8.6 mg/dL — ABNORMAL LOW (ref 8.9–10.3)
Chloride: 99 mmol/L (ref 98–111)
Creatinine, Ser: 0.72 mg/dL (ref 0.61–1.24)
GFR, Estimated: >60 mL/min (ref >60)
Glucose, Bld: 112 mg/dL — ABNORMAL HIGH (ref 70–99)
Potassium: 4.5 mmol/L (ref 3.5–5.1)
Sodium: 133 mmol/L — ABNORMAL LOW (ref 135–145)

## 2023-02-12 LAB — GLUCOSE, CAPILLARY
Glucose-Capillary: 113 mg/dL — ABNORMAL HIGH (ref 70–99)
Glucose-Capillary: 124 mg/dL — ABNORMAL HIGH (ref 70–99)
Glucose-Capillary: 126 mg/dL — ABNORMAL HIGH (ref 70–99)
Glucose-Capillary: 168 mg/dL — ABNORMAL HIGH (ref 70–99)
Glucose-Capillary: 204 mg/dL — ABNORMAL HIGH (ref 70–99)
Glucose-Capillary: 231 mg/dL — ABNORMAL HIGH (ref 70–99)
Glucose-Capillary: 96 mg/dL (ref 70–99)

## 2023-02-12 LAB — CBC
HCT: 30.1 % — ABNORMAL LOW (ref 39.0–52.0)
Hemoglobin: 10.3 g/dL — ABNORMAL LOW (ref 13.0–17.0)
MCH: 32 pg (ref 26.0–34.0)
MCHC: 34.2 g/dL (ref 30.0–36.0)
MCV: 93.5 fL (ref 80.0–100.0)
Platelets: 394 10*3/uL (ref 150–400)
RBC: 3.22 MIL/uL — ABNORMAL LOW (ref 4.22–5.81)
RDW: 12.9 % (ref 11.5–15.5)
WBC: 7.8 10*3/uL (ref 4.0–10.5)
nRBC: 0 % (ref 0.0–0.2)

## 2023-02-12 MED ORDER — KCL IN DEXTROSE-NACL 20-5-0.45 MEQ/L-%-% IV SOLN
INTRAVENOUS | Status: DC
  Filled 2023-02-12: qty 1000

## 2023-02-12 NOTE — Progress Notes (Signed)
[facility] PROGRESS NOTE  [name] (DOB: September 24, 1950) QMV:[id number] PCP: [name], MD Outpatient Specialists: Dr. Pollyann Kennedy, ENT  Brief Narrative: [name] is a 72 y.o. M with hx smoking, laryngeal CA s/p tracheostomy, DM on insulin pump, PVD s/p left BKA in 2017, right fem-pop bypass in 2021, CAD never PCI, HLD, and HTN who presented for laryngectomy.  In April 2024, admitted for hoarseness, stridor, found to have laryngeal mass.  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   Doing well with tracheostomy since, presented 6/14 for elective total laryngectomy.  Hospitalist service consulted 6/17 for medical management, titration of insulin.  Subjective: No complaints, communicates by writing on a board but did not wish to do so this morning.   Objective: BP 134/60 (BP Location: Left Arm)   Pulse 62   Temp 98.6 F (37 C) (Oral)   Resp 18   Ht 5\' 11"  (1.803 m)   Wt 57.1 kg   SpO2 96%   BMI 17.56 kg/m   Gen: No distress Neck: Tracheostomy stoma without exudate, left neck JP drain in position with serosanguinous drainage, minimal.  Pulm: Clear, nonlabored  CV: RRR, no MRG or edema GI: Soft, NT, ND, +BS Neuro: Alert and oriented, MAEW. Ext: Warm, no deformities. Skin: No other rashes, lesions or ulcers on visualized skin   Assessment & Plan: Laryngeal cancer: s/p total laryngectomy and bilateral neck dissection on 6/14 by Dr. Pollyann Kennedy. Invasive moderately differentiated squamous cell carcinoma, negative margins and LNs. - Currently has an NG tube for feeding placed by IR on 6/18. Starting water this AM. Possible clears later today per primary.  T1DM on insulin pump: HbA1c 8%.  - Continue basal-bolus insulin while advancing diet. If tolerates, would change SSI q4h > AC/HS. Continue moderate  scale and 4u coverage. Continue glargine 18u daily. Consider restart insulin pump once dietary advancement is stable.  History of COPD, acute hypoxic respiratory failure secondary to obstructing mass s/p tracheostomy: Doing well on room air. No wheezing.   - Continue pulmicort, brovana, prn duoneb.    HTN: Normotensive without directed Tx.    Depression:  - Continue SSRI  HLD:  - Continue rosuvastatin    PVD, history of osteomyelitis s/p left BKA - Holding ASA 81mg  per ENT. Continue statin - Continue routine vascular surgery follow up  Tyrone Nine, MD [facility] [email or web address] 02/12/2023, 10:36 AM

## 2023-02-12 NOTE — Progress Notes (Signed)
I spoke with Mr. Barrell and his daughter Jeanice Lim this afternoon. They asked about total laryngectomy support groups - they will be provided with this information from [place] once [employer] reaches out to discuss ongoing supplies - Mr. Thatch aware.  Holly asked to be the contact going forward for [employer] - completed.            She is thus far very knowledgeable about   supplies and care of a laryngectomy and has visited every day to learn more - she is an appropriate support contact.   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  The only item insurance will not cover on an ongoing basis is the shower guard - he will have one from the current pulmonary kit at bedside and they will send him one more - beyond this he will have to pay, however this is a durable supply and should not need to be replaced often. Cost is $15.00.  Multiple questions about diet when he goes home, it sounds like he will be on a full liquid diet at home at least until his first follow-up appointment - I will place a nutrition consult.  Although he mostly writes now to communicate, he says he is committed to learning to use his electrolarynx.

## 2023-02-12 NOTE — TOC Progression Note (Addendum)
Transition of Care [facility]) - Progression Note    Patient Details  Name: [name] MRN: [id number] Date of Birth: 1950/10/12  [facility] CM/SW Contact  Nadene Rubins Adria Devon, RN Phone Number: 02/12/2023, 2:12 PM  Clinical Narrative:     Received secure chat from SP. SP recommending OP SP , they have discussed with PT, PT will see patient tomorrow.   Discussed with patient . Explained patient cannot have home health services and OP services at the same time. Patient went to [facility] when he had trach and prefers that location. Orders entered for MD to sign   Updated Tresa Endo with [facility] will follow until discharge.   Secure chatted Dr Pollyann Kennedy regarding home health vs OP therapy . Await response ( currently in surgery).   Expected Discharge Plan: Home w Home Health Services Barriers to Discharge: Continued Medical Work up  Expected Discharge Plan and Services   Discharge Planning Services: CM Consult Post Acute Care Choice: Home Health Living arrangements for the past 2 months: Single Family Home                           HH Arranged: PT, OT, Nurse's Aide, RN [facility] Agency: [facility] Date [facility] Agency Contacted: 02/08/23 Time HH Agency Contacted: [phone] Representative spoke with at [facility] Agency: Tresa Endo   Social Determinants of Health (SDOH) Interventions SDOH Screenings   Food Insecurity: No Food Insecurity (12/09/2022)  Housing: Low Risk  (12/09/2022)  Transportation Needs: No Transportation Needs (12/09/2022)  Utilities: Not At Risk (12/09/2022)  Depression (PHQ2-9): Low Risk  (01/04/2023)  Tobacco Use: Medium Risk (02/06/2023)    Readmission Risk Interventions    02/08/2023    1:34 PM 12/17/2022    5:12 PM 12/17/2022    2:58 PM  Readmission Risk Prevention Plan  Transportation Screening Complete  Complete  PCP or Specialist Appt within 5-7 Days Complete    Home Care Screening Complete Complete Complete  Medication Review (RN CM)  Referral to Pharmacy  Complete

## 2023-02-12 NOTE — Progress Notes (Signed)
Called to pt room for bleeding from larny tube pt. Is not distress sats 99% Hr 68 RN in room advised to get a xray and also to notify md of these new changes.

## 2023-02-12 NOTE — Progress Notes (Signed)
PT Cancellation Note  Patient Details Name: [name] MRN: [id number] DOB: Dec 12, 1950   Cancelled Treatment:    Reason Eval/Treat Not Completed: Patient declined, no reason specified  Pt using a writing tablet to communicate that he was planning to go outside today but currently waiting for the doctor to come by and did not want to miss him. He wrote that he wanted to ask about what all he was allowed to drink. He also communicated that he was supposed to d/c home on Monday. PT will continue to f/u with pt acutely as available and appropriate.   Alessandra Bevels Cailyn Houdek 02/12/2023, 11:25 AM

## 2023-02-12 NOTE — Progress Notes (Signed)
RT in to check patient for routine Laryngectomy assessment. Upon arrival to room, pt currently gone. RT to assess when pt arrives back to unit.

## 2023-02-12 NOTE — Progress Notes (Signed)
RN called on-call otolaryngology provider about bleeding from the laryn tube. Provider

## 2023-02-12 NOTE — Progress Notes (Signed)
Pt laryngectomy tube cleansed/dried and replaced for bloody secretions. Stoma site cleansed/dried/assessed & ointment applied.  Laryngectomy tube secured w/dressing in place. Will follow for further bleeding.

## 2023-02-12 NOTE — Progress Notes (Signed)
Subjective: Doing well, no new changes overnight.  Objective: Vital signs in last 24 hours: Temp:  [98.4 F (36.9 C)-98.6 F (37 C)] 98.6 F (37 C) (06/21 0728) Pulse Rate:  [61-68] 62 (06/21 0842) Resp:  [16-18] 18 (06/21 0842) BP: (112-134)/(53-60) 134/60 (06/21 0728) SpO2:  [96 %-98 %] 96 % (06/21 0842) Weight:  [57.1 kg] 57.1 kg (06/21 0412) Weight change: 1.2 kg Last BM Date : 02/10/23  Intake/Output from previous day: 06/20 0701 - 06/21 0700 In: 4215.3 [NG/GT:4215.3] Out: 320 [Urine:300; Drains:20] Intake/Output this shift: No intake/output data recorded.  PHYSICAL EXAM: Awake and alert.  Stoma clear and healthy.  Neck looks excellent.  Left drain removed.  I had him swallow some water and there is no signs of leakage.  He is able to swallow without much difficulty.  Lab Results: Recent Labs    02/10/23 0051 02/12/23 0552  WBC 6.1 7.8  HGB 9.6* 10.3*  HCT 27.9* 30.1*  PLT 330 394   BMET Recent Labs    02/11/23 1028 02/12/23 0552  NA 132* 133*  K 4.9 4.5  CL 99 99  CO2 24 27  GLUCOSE 177* 112*  BUN 17 22  CREATININE 0.70 0.72  CALCIUM 8.5* 8.6*    Studies/Results: No results found.  Medications: I have reviewed the patient's current medications.  Assessment/Plan: Stable postop.  Start on water today.  Will reevaluate in a couple of hours and if doing well we will put him on full clear liquids.  LOS: 7 days     Serena Colonel 02/12/2023, 9:29 AM

## 2023-02-13 DIAGNOSIS — C329 Malignant neoplasm of larynx, unspecified: Secondary | ICD-10-CM

## 2023-02-13 DIAGNOSIS — E109 Type 1 diabetes mellitus without complications: Secondary | ICD-10-CM | POA: Diagnosis not present

## 2023-02-13 DIAGNOSIS — J387 Other diseases of larynx: Secondary | ICD-10-CM | POA: Diagnosis not present

## 2023-02-13 DIAGNOSIS — Z9002 Acquired absence of larynx: Secondary | ICD-10-CM | POA: Diagnosis not present

## 2023-02-13 LAB — GLUCOSE, CAPILLARY
Glucose-Capillary: 303 mg/dL — ABNORMAL HIGH (ref 70–99)
Glucose-Capillary: 274 mg/dL — ABNORMAL HIGH (ref 70–99)
Glucose-Capillary: 140 mg/dL — ABNORMAL HIGH (ref 70–99)
Glucose-Capillary: 139 mg/dL — ABNORMAL HIGH (ref 70–99)
Glucose-Capillary: 56 mg/dL — ABNORMAL LOW (ref 70–99)
Glucose-Capillary: 154 mg/dL — ABNORMAL HIGH (ref 70–99)
Glucose-Capillary: 257 mg/dL — ABNORMAL HIGH (ref 70–99)

## 2023-02-13 NOTE — Progress Notes (Incomplete)
RN called into pt room and found that the pt has a blood leaking around on laryngectomy tube. Charge Nurse was notified. RT was also notified, and came to assess the pt. RN called and on-call otolaryngology provider (Dr. Jenne Pane) made aware about the bleeding from the laryn tube. Instructed the pt NPO.

## 2023-02-13 NOTE — Progress Notes (Signed)
PT Cancellation Note  Patient Details Name: [name] MRN: [id number] DOB: [birth year]   Cancelled Treatment:    Reason Eval/Treat Not Completed: Patient declined, no reason specified  Pt again declining PT session today. PT observed pt ambulating in the hallway with his daughter as they were heading to the outside area of the hospital. He did not demonstrate any difficulties and did not report any issues afterwards. PT messaged CM regarding changing HHPT to no f/u PT services as PT spoke with pt this morning about having OPPT services to match his OP SLP services;  however, pt felt he did not need the OPPT services. PT will continue to f/u with pt acutely as available and appropriate.   Alessandra Bevels Chrystian Cupples 02/13/2023, 9:24 AM

## 2023-02-13 NOTE — Progress Notes (Signed)
Speech Language Pathology Treatment:  (laryngectomy)  Patient Details Name: [name] MRN: [id number] DOB: 09-03-1950 Today's Date: 02/13/2023 Time: [id number] SLP Time Calculation (min) (ACUTE ONLY): 10 min  Assessment / Plan / Recommendation Clinical Impression  [name] is in good spirits, looking forward to removal of cortrak and D/C home in the next few days.  He generally prefers to use dry erase board for communication vs electrolarynx - still difficult with intraoral device to shape sounds efficiently for speech, so he is deferring to written communication, mouthing speech.  He prefers to f/u for OP SLP at [facility] office. He is eating/drinking a full diet; we reviewed changes in anatomy that should have little impact on swallowing. He verbalizes understanding. Will continue to follow while admitted. D/W RN.    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 Pollyann Kennedy).      SLP Plan  Continue with current plan of care      Recommendations for follow up therapy are one component of a multi-disciplinary discharge planning process, led by the attending physician.  Recommendations may be updated based on patient status, additional functional criteria and insurance authorization.    Recommendations   OP SLP                  Oral care BID   Intermittent Supervision/Assistance Aphonia (R49.1)     Continue with current plan of care    Burlon Centrella L. Samson Frederic, MA CCC/SLP Clinical Specialist - Acute Care SLP Acute Rehabilitation Services Office number [phone]  [name]  02/13/2023, 11:26 AM

## 2023-02-13 NOTE — Progress Notes (Signed)
PROGRESS NOTE  [name] ZOX:[id number] DOB: 06-16-1951   PCP: [name], MD  Patient is from: Home  DOA: 02/05/2023 LOS: 8  Chief complaints No chief complaint on file.    Brief Narrative / Interim history: 72 y.o. M with hx smoking, laryngeal SCC s/p tracheostomy, DM on insulin pump, PVD s/p left BKA in 2017, right fem-pop bypass in 2021, CAD never PCI, HLD, and HTN who presented for laryngectomy.  In April 2024, admitted for hoarseness, stridor, found to have laryngeal mass.  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   Doing well with tracheostomy since, presented 6/14 for elective total laryngectomy.  Hospitalist service consulted 6/17 for medical management, titration of insulin.    Subjective: Seen and examined earlier this morning.  Some bleeding from laryngectomy site overnight.  Laryngectomy tube cleaned and replaced.  Dressing applied.  Evaluated by ENT this morning.  No complaints.  Started on full liquid diet.  He is also on tube feed.  Communicates by writing.  Objective: Vitals:   02/13/23 0521 02/13/23 0718 02/13/23 1217 02/13/23 1236  BP: (!) 132/48 (!) 127/53 125/72   Pulse: 62 62 73 64  Resp: 18 16 19 18   Temp: 98.1 F (36.7 C) 98.6 F (37 C)    TempSrc: Oral Oral    SpO2: 96% 96% 95% 96%  Weight: 56.5 kg     Height:        Examination:  GENERAL: No apparent distress.  Nontoxic. HEENT: MMM.  Vision and hearing grossly intact.  NG tube for feeding NECK: Laryngectomy tube in place. RESP:  No IWOB.  Fair aeration bilaterally. CVS:  RRR. Heart sounds normal.  ABD/GI/GU: BS+. Abd soft, NTND.  MSK/EXT:  Moves extremities. No apparent deformity. No edema.  SKIN: no apparent skin lesion or wound NEURO: Awake, alert and oriented appropriately.  No apparent focal neuro  deficit. PSYCH: Calm. Normal affect.   Procedures:  S/p total laryngectomy and bilateral neck dissection on 6/14 by Dr. Pollyann Kennedy.  Microbiology summarized: MRSA PCR screen nonreactive.  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 Pollyann Kennedy. Invasive moderately differentiated squamous cell carcinoma, negative margins and LNs. -Currently has an NG tube for feeding placed by IR on 6/18.  -Advance to full liquid diet by ENT this morning.   T1DM on insulin pump: HbA1c 8%.  Recent Labs  Lab 02/12/23 2356 02/13/23 0414 02/13/23 0811 02/13/23 1153 02/13/23 1311  GLUCAP 96 303* 274* 140* 139*  -On moderate SSI and additional NovoLog 4 units every 4 hours while on tube feed. -Will change to ACHS once off tube feed to p.o.   Acute respiratory failure with hypoxia due to obstructing mass s/p tracheostomy: On room air. Chronic COPD: Stable. -Continue pulmicort, brovana, prn duoneb.    HTN: Normotensive without directed Tx.    Depression: Stable -Continue SSRI   HLD:  -Continue rosuvastatin    PVD, history of osteomyelitis s/p left BKA -Holding ASA 81mg  per ENT. Continue statin -Continue routine vascular surgery follow up  Severe malnutrition Body mass index is 17.38 kg/m. Nutrition Problem: Severe Malnutrition Etiology: chronic illness (laryngeal tumor, COPD) Signs/Symptoms: severe fat depletion,  severe muscle depletion, percent weight loss (10% weight loss in < 3 months) Percent weight loss: 10 % Interventions: Tube feeding, Refer to RD note for recommendations   DVT prophylaxis:  SCDs Start: 02/05/23 1431  Code Status: Full code Family Communication: None at bedside Level of care: Med-Surg Status is: Inpatient   Final disposition: Per  primary team. Consultants:  Hospitalist  35 minutes with more than 50% spent in reviewing records, counseling patient/family and coordinating care.   Sch Meds:  Scheduled Meds:  arformoterol  15 mcg Nebulization BID   bacitracin  1 Application Topical Q8H   budesonide (PULMICORT) nebulizer solution  0.5 mg Nebulization BID   Chlorhexidine Gluconate Cloth  6 each Topical Daily   docusate  100 mg Per Tube Daily   feeding supplement (PROSource TF20)  60 mL Per Tube Daily   insulin aspart  0-15 Units Subcutaneous Q4H   insulin aspart  4 Units Subcutaneous Q4H   insulin glargine-yfgn  18 Units Subcutaneous Daily   melatonin  3 mg Per Tube QHS   mouth rinse  15 mL Mouth Rinse 4 times per day   rosuvastatin  10 mg Per Tube Daily   senna-docusate  1 tablet Per Tube QHS   sertraline  50 mg Per Tube QHS   Continuous Infusions:  sodium chloride Stopped (02/06/23 1951)   feeding supplement (JEVITY 1.2 CAL) 1,000 mL (02/12/23 2346)   PRN Meds:.sodium chloride, acetaminophen, hydrOXYzine, ipratropium-albuterol, ondansetron (ZOFRAN) IV, mouth rinse, oxyCODONE  Antimicrobials: Anti-infectives (From admission, onward)    Start     Dose/Rate Route Frequency Ordered Stop   02/05/23 1530  Ampicillin-Sulbactam (UNASYN) 3 g in sodium chloride 0.9 % 100 mL IVPB        3 g 200 mL/hr over 30 Minutes Intravenous Every 6 hours 02/05/23 1430 02/05/23 1619   02/05/23 0800  Ampicillin-Sulbactam (UNASYN) 3 g in sodium chloride 0.9 % 100 mL IVPB        3 g 200 mL/hr over 30 Minutes Intravenous To Surgery 02/05/23 0759 02/06/23 0700        I have personally reviewed the following labs and images: CBC: Recent Labs  Lab 02/07/23 0616 02/08/23 0648 02/10/23 0051 02/12/23 0552  WBC 8.8 8.7 6.1 7.8  HGB 9.8* 10.2* 9.6* 10.3*  HCT 29.3* 30.4* 27.9* 30.1*  MCV 93.9 93.3 91.8 93.5  PLT 273 306 330 394   BMP &GFR Recent Labs  Lab 02/06/23 1725 02/07/23 0616 02/07/23 0616 02/07/23 1723  02/08/23 0648 02/08/23 1743 02/09/23 0048 02/10/23 0051 02/11/23 1028 02/12/23 0552  NA  --  137   < >  --  140  --  135 131* 132* 133*  K  --  4.3   < >  --  4.6  --  4.1 4.7 4.9 4.5  CL  --  100   < >  --  101  --  101 98 99 99  CO2  --  25   < >  --  28  --  28 28 24 27   GLUCOSE  --  279*   < >  --  165*  --  92 166* 177* 112*  BUN  --  20   < >  --  20  --  22 17 17 22   CREATININE  --  0.65   < >  --  0.57*  --  0.57* 0.61 0.70 0.72  CALCIUM  --  8.2*   < >  --  8.2*  --  8.2* 8.2* 8.5* 8.6*  MG 2.3 1.9  --  1.9 2.0 2.0  --   --   --   --   PHOS 3.0 2.7  --  3.2 3.6 4.2  --   --   --   --    < > = values in this interval not displayed.   Estimated Creatinine Clearance: 68.7 mL/min (by C-G formula based on SCr of 0.72 mg/dL). Liver & Pancreas: No results for input(s): "AST", "ALT", "ALKPHOS", "BILITOT", "PROT", "ALBUMIN" in the last 168 hours. No results for input(s): "LIPASE", "AMYLASE" in the last 168 hours. No results for input(s): "AMMONIA" in the last 168 hours. Diabetic: No results for input(s): "HGBA1C" in the last 72 hours. Recent Labs  Lab 02/12/23 2356 02/13/23 0414 02/13/23 0811 02/13/23 1153 02/13/23 1311  GLUCAP 96 303* 274* 140* 139*   Cardiac Enzymes: No results for input(s): "CKTOTAL", "CKMB", "CKMBINDEX", "TROPONINI" in the last 168 hours. No results for input(s): "PROBNP" in the last 8760 hours. Coagulation Profile: No results for input(s): "INR", "PROTIME" in the last 168 hours. Thyroid Function Tests: No results for input(s): "TSH", "T4TOTAL", "FREET4", "T3FREE", "THYROIDAB" in the last 72 hours. Lipid Profile: No results for input(s): "CHOL", "HDL", "LDLCALC", "TRIG", "CHOLHDL", "LDLDIRECT" in the last 72 hours. Anemia Panel: No results for input(s): "VITAMINB12", "FOLATE", "FERRITIN", "TIBC", "IRON", "RETICCTPCT" in the last 72 hours. Urine analysis:    Component Value Date/Time   COLORURINE YELLOW 10/22/2015 1307   APPEARANCEUR CLEAR  10/22/2015 1307   LABSPEC 1.011 10/22/2015 1307   PHURINE 5.0 10/22/2015 1307   GLUCOSEU >1000 (A) 10/22/2015 1307   HGBUR NEGATIVE 10/22/2015 1307   BILIRUBINUR NEGATIVE 10/22/2015 1307   KETONESUR NEGATIVE 10/22/2015 1307   PROTEINUR NEGATIVE 10/22/2015 1307   NITRITE NEGATIVE 10/22/2015 1307   LEUKOCYTESUR NEGATIVE 10/22/2015 1307   Sepsis Labs: Invalid input(s): "PROCALCITONIN", "LACTICIDVEN"  Microbiology: Recent Results (from the past 240 hour(s))  MRSA Next Gen by PCR, Nasal     Status: None   Collection Time: 02/05/23  2:55 PM   Specimen: Nasal Mucosa; Nasal Swab  Result Value Ref Range Status   MRSA by PCR Next Gen NOT DETECTED NOT DETECTED Final    Comment: (NOTE) The GeneXpert MRSA Assay (FDA approved for NASAL specimens only), is one component of a comprehensive MRSA colonization surveillance program. It is not intended to diagnose MRSA infection nor to guide or monitor treatment for MRSA infections. Test performance is not FDA approved in patients less than 60 years old. Performed at [facility], [address] [place], [place] [address]     Radiology Studies: No results found.    Namira Rosekrans T. Eesa Justiss [facility]  If 7PM-7AM, please contact night-coverage [email or web address] 02/13/2023, 3:37 PM

## 2023-02-13 NOTE — Progress Notes (Signed)
Subjective: Reported by nursing some bloody drainage about the stoma last night.  Had him hold oral intake.  This morning, no further bleeding.  Feels good.  Objective: Vital signs in last 24 hours: Temp:  [98 F (36.7 C)-98.6 F (37 C)] 98.6 F (37 C) (06/22 0718) Pulse Rate:  [62-68] 62 (06/22 0718) Resp:  [16-18] 16 (06/22 0718) BP: (122-136)/(48-54) 127/53 (06/22 0718) SpO2:  [95 %-98 %] 96 % (06/22 0718) Weight:  [56.5 kg] 56.5 kg (06/22 0521) Wt Readings from Last 1 Encounters:  02/13/23 56.5 kg    Intake/Output from previous day: 06/21 0701 - 06/22 0700 In: 1581.3 [P.O.:200; I.V.:247.7; NG/GT:1133.6] Out: 400 [Urine:400] Intake/Output this shift: No intake/output data recorded.  General appearance: alert, cooperative, and no distress Neck: incisions look great, stoma healthy, no bleeding, had him drink water while watching stoma and no leaking seen, lary tube replaced  Recent Labs    02/12/23 0552  WBC 7.8  HGB 10.3*  HCT 30.1*  PLT 394    Recent Labs    02/11/23 1028 02/12/23 0552  NA 132* 133*  K 4.9 4.5  CL 99 99  CO2 24 27  GLUCOSE 177* 112*  BUN 17 22  CREATININE 0.70 0.72  CALCIUM 8.5* 8.6*    Medications: I have reviewed the patient's current medications.  Assessment/Plan: S/p total laryngectomy  No sign of fistula.  Will resume oral diet and lock IV.  Continue with feeding tube for the time-being.   LOS: 8 days   Christia Reading 02/13/2023, 10:02 AM

## 2023-02-14 DIAGNOSIS — C329 Malignant neoplasm of larynx, unspecified: Secondary | ICD-10-CM | POA: Diagnosis not present

## 2023-02-14 DIAGNOSIS — Z9002 Acquired absence of larynx: Secondary | ICD-10-CM | POA: Diagnosis not present

## 2023-02-14 DIAGNOSIS — E109 Type 1 diabetes mellitus without complications: Secondary | ICD-10-CM | POA: Diagnosis not present

## 2023-02-14 DIAGNOSIS — J387 Other diseases of larynx: Secondary | ICD-10-CM | POA: Diagnosis not present

## 2023-02-14 LAB — CBC
HCT: 28.5 % — ABNORMAL LOW (ref 39.0–52.0)
Hemoglobin: 9.8 g/dL — ABNORMAL LOW (ref 13.0–17.0)
MCH: 31.7 pg (ref 26.0–34.0)
MCHC: 34.4 g/dL (ref 30.0–36.0)
MCV: 92.2 fL (ref 80.0–100.0)
Platelets: 459 10*3/uL — ABNORMAL HIGH (ref 150–400)
RBC: 3.09 MIL/uL — ABNORMAL LOW (ref 4.22–5.81)
RDW: 13.2 % (ref 11.5–15.5)
WBC: 12 10*3/uL — ABNORMAL HIGH (ref 4.0–10.5)
nRBC: 0 % (ref 0.0–0.2)

## 2023-02-14 LAB — RENAL FUNCTION PANEL
Albumin: 2.3 g/dL — ABNORMAL LOW (ref 3.5–5.0)
Anion gap: 9 (ref 5–15)
BUN: 19 mg/dL (ref 8–23)
CO2: 26 mmol/L (ref 22–32)
Calcium: 8.4 mg/dL — ABNORMAL LOW (ref 8.9–10.3)
Chloride: 97 mmol/L — ABNORMAL LOW (ref 98–111)
Creatinine, Ser: 0.7 mg/dL (ref 0.61–1.24)
GFR, Estimated: >60 mL/min (ref >60)
Glucose, Bld: 169 mg/dL — ABNORMAL HIGH (ref 70–99)
Phosphorus: 3.7 mg/dL (ref 2.5–4.6)
Potassium: 4.6 mmol/L (ref 3.5–5.1)
Sodium: 132 mmol/L — ABNORMAL LOW (ref 135–145)

## 2023-02-14 LAB — MAGNESIUM: Magnesium: 1.9 mg/dL (ref 1.7–2.4)

## 2023-02-14 LAB — GLUCOSE, CAPILLARY
Glucose-Capillary: 121 mg/dL — ABNORMAL HIGH (ref 70–99)
Glucose-Capillary: 165 mg/dL — ABNORMAL HIGH (ref 70–99)
Glucose-Capillary: 170 mg/dL — ABNORMAL HIGH (ref 70–99)
Glucose-Capillary: 176 mg/dL — ABNORMAL HIGH (ref 70–99)
Glucose-Capillary: 216 mg/dL — ABNORMAL HIGH (ref 70–99)
Glucose-Capillary: 248 mg/dL — ABNORMAL HIGH (ref 70–99)
Glucose-Capillary: 266 mg/dL — ABNORMAL HIGH (ref 70–99)
Glucose-Capillary: 292 mg/dL — ABNORMAL HIGH (ref 70–99)

## 2023-02-14 MED ORDER — INSULIN GLARGINE-YFGN 100 UNIT/ML ~~LOC~~ SOLN
20.0000 [IU] | Freq: Every day | SUBCUTANEOUS | Status: DC
  Administered 2023-02-15 – 2023-02-17 (×3): 20 [IU] via SUBCUTANEOUS
  Filled 2023-02-14 (×3): qty 0.2

## 2023-02-14 MED ORDER — GLUCERNA SHAKE PO LIQD
237.0000 mL | Freq: Three times a day (TID) | ORAL | Status: DC
  Administered 2023-02-14 – 2023-02-17 (×5): 237 mL via ORAL

## 2023-02-14 MED ORDER — INSULIN ASPART 100 UNIT/ML IJ SOLN
5.0000 [IU] | INTRAMUSCULAR | Status: DC
  Administered 2023-02-14 – 2023-02-16 (×9): 5 [IU] via SUBCUTANEOUS

## 2023-02-14 MED ORDER — ENSURE ENLIVE PO LIQD: 237.0000 mL | Freq: Three times a day (TID) | ORAL | Status: DC

## 2023-02-14 NOTE — Progress Notes (Signed)
Nutrition Brief Note:   MD consult for patient wanting more information s/p laryngectomy on full liquid diet. Pt only able to communicate via a board. RD will plan to see pt in-person to provide information on full liquid diet. RD will also attach full-liquid diet education to discharge paperwork.   Bethann Humble, RD, LDN, CNSC.

## 2023-02-14 NOTE — Discharge Instructions (Signed)
Keep the pressure wrap around the neck.   Use the LaryTube as much as you feel comfortable. Change the gauze anytime it feels saturated. Stay on a liquid diet only. Crush or cut any larger pills into smaller pieces. Avoid getting water in the stoma. Stay on liquid diet only.     Full Liquid Nutrition Therapy  The full liquid diet includes mostly liquids (including milk) and some foods with small amounts of fiber. The full liquid diet can provide many of the nutrients your body needs, but it may not give enough vitamins, minerals, and fiber. A fluid is anything that is liquid or anything that would melt if left at room temperature.  You will need to count these foods and liquids--including any liquid used to take medication--as part of your daily fluid intake. Some examples are: Coffee, tea, and other hot beverages Gelatin  Gravy Ice cream, sherbet, sorbet Ice cubes, ice chips Milk, liquid creamer Nutritional supplements Popsicles Vegetable and fruit juices; fluid in canned fruit (fruit itself is not allowed) Yogurt (without nuts, seeds, or fruit) Soft drinks, lemonade, limeade Soups (strained) Syrup This diet should only be used temporarily during your recovery until it is safe for you to eat regular foods. Your registered dietitian nutritionist can help establish a nutritionally-balanced full liquid meal plan, if needed.    Tips Determine which foods/fluids from the list are most appealing to you. Eat or drink your favorite flavors to help you better enjoy this diet. Include milk-based fluids and juices. If you are lactose intolerant, choose plant-based milks. Eat 3 full liquid meals throughout the day and include a snack time between each meal. Drink nutritional shakes in 6-8 ounce servings as part of a meal or between meals to make sure you're taking in enough calories. You can make fortified shakes for yourself or buy them premade at a store.  Foods Recommended Food Group  Foods Recommended  Grains Thin hot cereal, such as cream of wheat  Dairy Milk: Nonfat, 1%, 2%, whole Soy milk, almond milk, rice milk, coconut milk, cashew milk Milkshakes Yogurt Custard Pudding  Vegetables Vegetable juice with or without pulp Thin, pureed vegetable soups  Fruits Translucent fruit juices without pulp (apple, cranberry, grape)  Oils Oils: Almond, avocado, canola, cashew, corn, grapeseed, olive, safflower, sesame, soybean, sunflower Butter (melted) Margarine (melted)   Other Flavored gelatin (any flavor) Strained cream soups Chicken, beef, or vegetable broths Popsicle  Beverages Water Ice Soda Tea Coffee Nutritional supplements or shakes    A high-protein shake should contain at least 8-10 grams of protein per serving. Read product labels to find a shake that is high in protein. If you are preparing the shake at home, you can increase the amount of protein by adding protein powder, non-fat dry milk powder, yogurt, or low-fat milk.     Foods Not Recommended Food Group Foods Not Recommended  Grains All grain foods including whole grains, processed grains such as pasta, rice, cold cereals, bread, snacks, and sweets that are flour-based (cakes, cookies)  Protein Foods Beef and pork  Chicken and [place]  Fish  Nuts and nut butters  Eggs  Meat substitutes Cold cuts or lunch meat  Sausage   Dairy Hard cheese Yogurt with fruit chunks  Vegetables Whole, frozen, fresh, canned varieties   Fruits Whole, frozen, fresh, canned varieties   Oils Butter Coconut oil Palm Oil Lard   Full Liquid Diet Sample 1-Day Menu View Nutrient Info Breakfast 1 cup cream of wheat 1 cup yogurt (  without nuts, seeds, or fruit)  cup orange juice (without pulp)  cup high-protein nutritional shake 1 cup coffee  Lunch 1 cup cream of potato soup, blended and strained  cup apple juice 1 cup 1% milk 1 cup tea  Evening Meal 1 cup cream of broccoli soup, blended and strained 1 cup 1%  milk 1 cup high-protein nutritional shake  Daily Sum Nutrient Unit Value  Macronutrients  Energy kcal 1467  Energy kJ 6123  Protein g 60  Total lipid (fat) g 47  Carbohydrate, by difference g 207  Fiber, total dietary g 4  Sugars, total g 71  Minerals  Calcium, Ca mg 1904  Iron, Fe mg 20  Sodium, Na mg 2828  Vitamins  Vitamin C, total ascorbic acid mg 144  Vitamin A, IU IU 4201  Vitamin D IU 402  Lipids  Fatty acids, total saturated g 17  Fatty acids, total monounsaturated g 12  Fatty acids, total polyunsaturated g 17  Cholesterol mg 89     Copyright 2020  Academy of Nutrition and Dietetics. All rights reserved   Nutrition [facility] Stay Proper nutrition can help your body recover from illness and injury.   Foods and beverages high in protein, vitamins, and minerals help rebuild muscle loss, promote healing, & reduce fall risk.   In addition to eating healthy foods, a nutrition shake is an easy, delicious way to get the nutrition you need during and after your hospital stay  It is recommended that you continue to drink 2 bottles per day of:  Ensure Plus or Glucerna for at least 1 month (30 days) after your hospital stay   Tips for adding a nutrition shake into your routine: As allowed, drink one with vitamins or medications instead of water or juice Enjoy one as a tasty mid-morning or afternoon snack Drink cold or make a milkshake out of it Drink one instead of milk with cereal or snacks Use as a coffee creamer   Available at the following grocery stores and pharmacies:           * Karin Golden * [employer] * [employer]  * [employer]          * [employer] * [employer]  * [employer]      * Target  * [employer]   * [employer]  * [employer]   * [facility] [phone]            For COUPONS visit: www.ensure.com/join or RoleLink.com.br   Suggested Substitutions Ensure Plus = Boost Plus = Carnation Breakfast Essentials = Boost Compact Glucerna  Shake = Boost Glucose Control = Carnation Breakfast Essentials SUGAR FREE

## 2023-02-14 NOTE — Plan of Care (Signed)
  Problem: Education: Goal: Knowledge of General Education information will improve Description: Including pain rating scale, medication(s)/side effects and non-pharmacologic comfort measures Outcome: Progressing   Problem: Health Behavior/Discharge Planning: Goal: Ability to manage health-related needs will improve Outcome: Progressing   Problem: Clinical Measurements: Goal: Ability to maintain clinical measurements within normal limits will improve Outcome: Progressing Goal: Will remain free from infection Outcome: Progressing Goal: Diagnostic test results will improve Outcome: Progressing Goal: Respiratory complications will improve Outcome: Progressing Goal: Cardiovascular complication will be avoided Outcome: Progressing   Problem: Activity: Goal: Risk for activity intolerance will decrease Outcome: Progressing   Problem: Nutrition: Goal: Adequate nutrition will be maintained Outcome: Progressing   Problem: Coping: Goal: Level of anxiety will decrease Outcome: Progressing   Problem: Elimination: Goal: Will not experience complications related to bowel motility Outcome: Progressing Goal: Will not experience complications related to urinary retention Outcome: Progressing   Problem: Pain Managment: Goal: General experience of comfort will improve Outcome: Progressing   Problem: Safety: Goal: Ability to remain free from injury will improve Outcome: Progressing   Problem: Skin Integrity: Goal: Risk for impaired skin integrity will decrease Outcome: Progressing   Problem: Education: Goal: Ability to describe self-care measures that may prevent or decrease complications (Diabetes Survival Skills Education) will improve Outcome: Progressing Goal: Individualized Educational Video(s) Outcome: Progressing   Problem: Coping: Goal: Ability to adjust to condition or change in health will improve Outcome: Progressing   Problem: Fluid Volume: Goal: Ability to  maintain a balanced intake and output will improve Outcome: Progressing   Problem: Health Behavior/Discharge Planning: Goal: Ability to identify and utilize available resources and services will improve Outcome: Progressing Goal: Ability to manage health-related needs will improve Outcome: Progressing   Problem: Metabolic: Goal: Ability to maintain appropriate glucose levels will improve Outcome: Progressing   Problem: Nutritional: Goal: Maintenance of adequate nutrition will improve Outcome: Progressing Goal: Progress toward achieving an optimal weight will improve Outcome: Progressing   Problem: Skin Integrity: Goal: Risk for impaired skin integrity will decrease Outcome: Progressing   Problem: Tissue Perfusion: Goal: Adequacy of tissue perfusion will improve Outcome: Progressing   Problem: Education: Goal: Knowledge of the prescribed therapeutic regimen will improve Outcome: Progressing   Problem: Activity: Goal: Ability to tolerate increased activity will improve Outcome: Progressing   Problem: Health Behavior/Discharge Planning: Goal: Identification of resources available to assist in meeting health care needs will improve Outcome: Progressing   Problem: Nutrition: Goal: Maintenance of adequate nutrition will improve Outcome: Progressing   Problem: Clinical Measurements: Goal: Complications related to the disease process, condition or treatment will be avoided or minimized Outcome: Progressing   Problem: Respiratory: Goal: Will regain and/or maintain adequate ventilation Outcome: Progressing   Problem: Skin Integrity: Goal: Demonstration of wound healing without infection will improve Outcome: Progressing

## 2023-02-14 NOTE — Progress Notes (Signed)
PROGRESS NOTE  [name]:[id number] DOB: September 11, 1950   PCP: [name], MD  Patient is from: Home  DOA: 02/05/2023 LOS: 9  Chief complaints No chief complaint on file.    Brief Narrative / Interim history: 72 y.o. M with hx smoking, laryngeal SCC s/p tracheostomy, DM on insulin pump, PVD s/p left BKA in 2017, right fem-pop bypass in 2021, CAD never PCI, HLD, and HTN who presented for laryngectomy.  In April 2024, admitted for hoarseness, stridor, found to have laryngeal mass.  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   Doing well with tracheostomy since, presented 6/14 for elective total laryngectomy.  Hospitalist service consulted 6/17 for medical management, titration of insulin while on tube feed.    Subjective: Seen and examined earlier this morning.  No major events overnight of this morning.  No complaints.  Objective: Vitals:   02/13/23 2051 02/14/23 0605 02/14/23 0733 02/14/23 0813  BP:  (!) 143/47 (!) 133/51   Pulse:  62 62 62  Resp:  18 18 18   Temp:  98.5 F (36.9 C) 98.6 F (37 C)   TempSrc:  Oral Oral   SpO2: 98% 97% 99%   Weight:  56.4 kg    Height:        Examination:  GENERAL: No apparent distress.  Nontoxic. HEENT: MMM.  Vision and hearing grossly intact.  NG tube for feeding NECK: Laryngectomy tube in place. RESP:  No IWOB.  Fair aeration bilaterally. CVS:  RRR. Heart sounds normal.  ABD/GI/GU: BS+. Abd soft, NTND.  MSK/EXT:  Moves extremities. No apparent deformity. No edema.  SKIN: no apparent skin lesion or wound NEURO: Awake, alert and oriented appropriately.  No apparent focal neuro deficit. PSYCH: Calm. Normal affect.   Procedures:  S/p total laryngectomy and bilateral neck dissection on 6/14 by Dr. Pollyann Kennedy.  Microbiology summarized: MRSA PCR  screen nonreactive.  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 Pollyann Kennedy. Invasive moderately differentiated squamous cell carcinoma, negative margins and LNs. -Currently has an NG tube for feeding placed by IR on 6/18.  -Advanced to full liquid diet per ENT.   T1DM on insulin pump: HbA1c 8%.  Recent Labs  Lab 02/13/23 2006 02/14/23 0032 02/14/23 0520 02/14/23 0733 02/14/23 1150  GLUCAP 257* 121* 216* 170* 292*  -On moderate SSI every 4 hours -Increase NovoLog from 4 to 5 units every 4 hours -Increase Semglee from 18 to 20 units daily -Will change to ACHS once off tube feed to p.o.   Acute respiratory failure with hypoxia due to obstructing mass s/p tracheostomy: On room air. Chronic COPD: Stable. -Continue pulmicort, brovana, prn duoneb.    HTN: Normotensive without directed Tx.    Depression: Stable -Continue SSRI   HLD:  -Continue rosuvastatin    PVD, history of osteomyelitis s/p left BKA -Holding ASA 81mg  per ENT. Continue statin -Continue routine vascular surgery follow up  Severe malnutrition Body mass index is 17.34 kg/m. Nutrition Problem: Severe Malnutrition Etiology: chronic illness (laryngeal tumor, COPD) Signs/Symptoms: severe fat depletion, severe muscle depletion, percent weight loss (10% weight loss in < 3 months) Percent weight loss: 10 % Interventions: Tube  feeding, Refer to RD note for recommendations   DVT prophylaxis:  SCDs Start: 02/05/23 1431  Code Status: Full code Family Communication: None at bedside Level of care: Med-Surg Status is: Inpatient   Final disposition: Per primary team. Consultants:  Hospitalist  35 minutes with more than 50% spent in reviewing records, counseling patient/family and  coordinating care.   Sch Meds:  Scheduled Meds:  arformoterol  15 mcg Nebulization BID   bacitracin  1 Application Topical Q8H   budesonide (PULMICORT) nebulizer solution  0.5 mg Nebulization BID   Chlorhexidine Gluconate Cloth  6 each Topical Daily   docusate  100 mg Per Tube Daily   feeding supplement (PROSource TF20)  60 mL Per Tube Daily   insulin aspart  0-15 Units Subcutaneous Q4H   insulin aspart  4 Units Subcutaneous Q4H   insulin glargine-yfgn  18 Units Subcutaneous Daily   melatonin  3 mg Per Tube QHS   mouth rinse  15 mL Mouth Rinse 4 times per day   rosuvastatin  10 mg Per Tube Daily   senna-docusate  1 tablet Per Tube QHS   sertraline  50 mg Per Tube QHS   Continuous Infusions:  sodium chloride Stopped (02/06/23 1951)   feeding supplement (JEVITY 1.2 CAL) 1,000 mL (02/14/23 0832)   PRN Meds:.sodium chloride, acetaminophen, hydrOXYzine, ipratropium-albuterol, ondansetron (ZOFRAN) IV, mouth rinse, oxyCODONE  Antimicrobials: Anti-infectives (From admission, onward)    Start     Dose/Rate Route Frequency Ordered Stop   02/05/23 1530  Ampicillin-Sulbactam (UNASYN) 3 g in sodium chloride 0.9 % 100 mL IVPB        3 g 200 mL/hr over 30 Minutes Intravenous Every 6 hours 02/05/23 1430 02/05/23 1619   02/05/23 0800  Ampicillin-Sulbactam (UNASYN) 3 g in sodium chloride 0.9 % 100 mL IVPB        3 g 200 mL/hr over 30 Minutes Intravenous To Surgery 02/05/23 0759 02/06/23 0700        I have personally reviewed the following labs and images: CBC: Recent Labs  Lab 02/08/23 0648 02/10/23 0051 02/12/23 0552 02/14/23 0353  WBC 8.7 6.1 7.8 12.0*  HGB 10.2* 9.6* 10.3* 9.8*  HCT 30.4* 27.9* 30.1* 28.5*  MCV 93.3 91.8 93.5 92.2  PLT 306 330 394 459*   BMP &GFR Recent Labs  Lab 02/07/23 1723 02/08/23 0648 02/08/23 0648 02/08/23 1743 02/09/23 0048 02/10/23 0051 02/11/23 1028 02/12/23 0552 02/14/23 0353  NA  --  140   < >  --  135 131* 132* 133* 132*  K  --  4.6    < >  --  4.1 4.7 4.9 4.5 4.6  CL  --  101   < >  --  101 98 99 99 97*  CO2  --  28   < >  --  28 28 24 27 26   GLUCOSE  --  165*   < >  --  92 166* 177* 112* 169*  BUN  --  20   < >  --  22 17 17 22 19   CREATININE  --  0.57*   < >  --  0.57* 0.61 0.70 0.72 0.70  CALCIUM  --  8.2*   < >  --  8.2* 8.2* 8.5* 8.6* 8.4*  MG 1.9 2.0  --  2.0  --   --   --   --  1.9  PHOS 3.2 3.6  --  4.2  --   --   --   --  3.7   < > = values in this interval not displayed.   Estimated Creatinine Clearance: 68.5 mL/min (by C-G formula based on SCr of 0.7 mg/dL). Liver & Pancreas: Recent Labs  Lab 02/14/23 0353  ALBUMIN 2.3*   No results for input(s): "LIPASE", "AMYLASE" in the last 168 hours. No results for input(s): "AMMONIA" in the last 168 hours. Diabetic: No results for input(s): "HGBA1C" in the last 72 hours. Recent Labs  Lab 02/13/23 2006 02/14/23 0032 02/14/23 0520 02/14/23 0733 02/14/23 1150  GLUCAP 257* 121* 216* 170* 292*   Cardiac Enzymes: No results for input(s): "CKTOTAL", "CKMB", "CKMBINDEX", "TROPONINI" in the last 168 hours. No results for input(s): "PROBNP" in the last 8760 hours. Coagulation Profile: No results for input(s): "INR", "PROTIME" in the last 168 hours. Thyroid Function Tests: No results for input(s): "TSH", "T4TOTAL", "FREET4", "T3FREE", "THYROIDAB" in the last 72 hours. Lipid Profile: No results for input(s): "CHOL", "HDL", "LDLCALC", "TRIG", "CHOLHDL", "LDLDIRECT" in the last 72 hours. Anemia Panel: No results for input(s): "VITAMINB12", "FOLATE", "FERRITIN", "TIBC", "IRON", "RETICCTPCT" in the last 72 hours. Urine analysis:    Component Value Date/Time   COLORURINE YELLOW 10/22/2015 1307   APPEARANCEUR CLEAR 10/22/2015 1307   LABSPEC 1.011 10/22/2015 1307   PHURINE 5.0 10/22/2015 1307   GLUCOSEU >1000 (A) 10/22/2015 1307   HGBUR NEGATIVE 10/22/2015 1307   BILIRUBINUR NEGATIVE 10/22/2015 1307   KETONESUR NEGATIVE 10/22/2015 1307   PROTEINUR NEGATIVE  10/22/2015 1307   NITRITE NEGATIVE 10/22/2015 1307   LEUKOCYTESUR NEGATIVE 10/22/2015 1307   Sepsis Labs: Invalid input(s): "PROCALCITONIN", "LACTICIDVEN"  Microbiology: Recent Results (from the past 240 hour(s))  MRSA Next Gen by PCR, Nasal     Status: None   Collection Time: 02/05/23  2:55 PM   Specimen: Nasal Mucosa; Nasal Swab  Result Value Ref Range Status   MRSA by PCR Next Gen NOT DETECTED NOT DETECTED Final    Comment: (NOTE) The GeneXpert MRSA Assay (FDA approved for NASAL specimens only), is one component of a comprehensive MRSA colonization surveillance program. It is not intended to diagnose MRSA infection nor to guide or monitor treatment for MRSA infections. Test performance is not FDA approved in patients less than 60 years old. Performed at [facility], [address], [place], [place] [address]     Radiology Studies: No results found.    Junaid Wurzer T. Suede [facility]  If 7PM-7AM, please contact night-coverage [email or web address] 02/14/2023, 1:25 PM

## 2023-02-14 NOTE — Progress Notes (Signed)
Subjective: Doing well.  No complaints.  Objective: Vital signs in last 24 hours: Temp:  [98.5 F (36.9 C)-98.6 F (37 C)] 98.6 F (37 C) (06/23 0733) Pulse Rate:  [61-73] 62 (06/23 0813) Resp:  [16-19] 18 (06/23 0813) BP: (125-143)/(47-79) 133/51 (06/23 0733) SpO2:  [95 %-99 %] 99 % (06/23 0733) FiO2 (%):  [28 %] 28 % (06/23 0813) Weight:  [56.4 kg] 56.4 kg (06/23 0605) Wt Readings from Last 1 Encounters:  02/14/23 56.4 kg    Intake/Output from previous day: 06/22 0701 - 06/23 0700 In: -  Out: 450 [Urine:450] Intake/Output this shift: No intake/output data recorded.  General appearance: alert, cooperative, and no distress Neck: incisions clean and intact, lary tube in place  Recent Labs    02/12/23 0552 02/14/23 0353  WBC 7.8 12.0*  HGB 10.3* 9.8*  HCT 30.1* 28.5*  PLT 394 459*    Recent Labs    02/12/23 0552 02/14/23 0353  NA 133* 132*  K 4.5 4.6  CL 99 97*  CO2 27 26  GLUCOSE 112* 169*  BUN 22 19  CREATININE 0.72 0.70  CALCIUM 8.6* 8.4*    Medications: I have reviewed the patient's current medications.  Assessment/Plan: S/p laryngectomy  Doing well.  Dr. Pollyann Kennedy to re-evaluate tomorrow, potential discharge home early in week.   LOS: 9 days   Christia Reading 02/14/2023, 9:14 AM

## 2023-02-14 NOTE — Progress Notes (Signed)
Physical Therapy Treatment Patient Details Name: [name] MRN: [id number] DOB: 08/01/1951 Today's Date: 02/14/2023   History of Present Illness Pt is 72 year old presented to [facility] on  02/05/23 for laryngeal CA. Underwent total laryngectomy on 02/05/23 PMH - tracheostomy 12/10/22, type 1 diabetes mellitus, recent COPD diagnosis, coronary artery disease, hyperlipidemia, hypertension and L BKA    PT Comments    Continuing work on functional mobility and activity tolerance;  Session focused on hallway amb, and discussing dc recommendations; Great progress with mobility, and pt does not need HHPT follow up; discussed Outpt PT, and he isn't interested at this time; Has more Speech therapy needs, and is interested in Outpt ST; Moving well, will continue to see while in hospital; OK for dc home from PT standpoint   Recommendations for follow up therapy are one component of a multi-disciplinary discharge planning process, led by the attending physician.  Recommendations may be updated based on patient status, additional functional criteria and insurance authorization.  Follow Up Recommendations       Assistance Recommended at Discharge Intermittent Supervision/Assistance  Patient can return home with the following A little help with walking and/or transfers;A little help with bathing/dressing/bathroom;Assistance with cooking/housework   Equipment Recommendations  None recommended by PT    Recommendations for Other Services       Precautions / Restrictions Precautions Precaution Comments: fall risk is present, minimized by use of RW     Mobility  Bed Mobility               General bed mobility comments: Met pt walking in the hallway    Transfers   Equipment used: Rolling walker (2 wheels) Transfers: Sit to/from Stand Sit to Stand: Supervision           General transfer comment: stood from relatively low bench by th ewindow in the hallway     Ambulation/Gait Ambulation/Gait assistance: Supervision Gait Distance (Feet): 200 Feet Assistive device: Rolling walker (2 wheels) Gait Pattern/deviations: Step-through pattern, Trunk flexed       General Gait Details: Walks a bit quickly, with RW slightly too far in front, but overall no overt loss of balance   Stairs             Wheelchair Mobility    Modified Rankin (Stroke Patients Only)       Balance     Sitting balance-Leahy Scale: Good       Standing balance-Leahy Scale: Fair                              Cognition Arousal/Alertness: Awake/alert Behavior During Therapy: WFL for tasks assessed/performed Overall Cognitive Status: Within Functional Limits for tasks assessed                                          Exercises      General Comments General comments (skin integrity, edema, etc.): Used Notes app for communication      Pertinent Vitals/Pain Pain Assessment Pain Assessment: No/denies pain    Home Living                          Prior Function            PT Goals (current goals can now be found in the care plan section)  Acute Rehab PT Goals Patient Stated Goal: Wants to get his strength back; would like to walk daily PT Goal Formulation: With patient Time For Goal Achievement: 02/20/23 Potential to Achieve Goals: Good Progress towards PT goals: Progressing toward goals    Frequency    Min 3X/week      PT Plan Discharge plan needs to be updated    Co-evaluation              AM-PAC PT "6 Clicks" Mobility   Outcome Measure  Help needed turning from your back to your side while in a flat bed without using bedrails?: None Help needed moving from lying on your back to sitting on the side of a flat bed without using bedrails?: None Help needed moving to and from a bed to a chair (including a wheelchair)?: None Help needed standing up from a chair using your arms (e.g.,  wheelchair or bedside chair)?: None Help needed to walk in hospital room?: A Little Help needed climbing 3-5 steps with a railing? : A Little 6 Click Score: 22    End of Session Equipment Utilized During Treatment: Gait belt (and L prosthesis) Activity Tolerance: Patient tolerated treatment well Patient left: in bed;with call bell/phone within reach Nurse Communication: Mobility status PT Visit Diagnosis: Unsteadiness on feet (R26.81);Other abnormalities of gait and mobility (R26.89);Muscle weakness (generalized) (M62.81)     Time: 1610-9604 PT Time Calculation (min) (ACUTE ONLY): 10 min  Charges:  $Gait Training: 8-22 mins                     Van Clines, PT  Acute Rehabilitation Services Office [phone] Secure Chat welcomed    [name] 02/14/2023, 5:47 PM

## 2023-02-15 ENCOUNTER — Inpatient Hospital Stay (HOSPITAL_COMMUNITY): Payer: Medicare Other

## 2023-02-15 DIAGNOSIS — J387 Other diseases of larynx: Secondary | ICD-10-CM | POA: Diagnosis not present

## 2023-02-15 DIAGNOSIS — C329 Malignant neoplasm of larynx, unspecified: Secondary | ICD-10-CM | POA: Diagnosis not present

## 2023-02-15 DIAGNOSIS — Z9002 Acquired absence of larynx: Secondary | ICD-10-CM | POA: Diagnosis not present

## 2023-02-15 DIAGNOSIS — E109 Type 1 diabetes mellitus without complications: Secondary | ICD-10-CM | POA: Diagnosis not present

## 2023-02-15 LAB — GLUCOSE, CAPILLARY
Glucose-Capillary: 104 mg/dL — ABNORMAL HIGH (ref 70–99)
Glucose-Capillary: 106 mg/dL — ABNORMAL HIGH (ref 70–99)
Glucose-Capillary: 179 mg/dL — ABNORMAL HIGH (ref 70–99)
Glucose-Capillary: 216 mg/dL — ABNORMAL HIGH (ref 70–99)
Glucose-Capillary: 234 mg/dL — ABNORMAL HIGH (ref 70–99)
Glucose-Capillary: 58 mg/dL — ABNORMAL LOW (ref 70–99)
Glucose-Capillary: 86 mg/dL (ref 70–99)

## 2023-02-15 LAB — RENAL FUNCTION PANEL
Albumin: 2.1 g/dL — ABNORMAL LOW (ref 3.5–5.0)
Anion gap: 8 (ref 5–15)
BUN: 25 mg/dL — ABNORMAL HIGH (ref 8–23)
CO2: 25 mmol/L (ref 22–32)
Calcium: 8.2 mg/dL — ABNORMAL LOW (ref 8.9–10.3)
Chloride: 96 mmol/L — ABNORMAL LOW (ref 98–111)
Creatinine, Ser: 0.59 mg/dL — ABNORMAL LOW (ref 0.61–1.24)
GFR, Estimated: >60 mL/min (ref >60)
Glucose, Bld: 190 mg/dL — ABNORMAL HIGH (ref 70–99)
Phosphorus: 3.6 mg/dL (ref 2.5–4.6)
Potassium: 4.8 mmol/L (ref 3.5–5.1)
Sodium: 129 mmol/L — ABNORMAL LOW (ref 135–145)

## 2023-02-15 LAB — CBC
HCT: 27.2 % — ABNORMAL LOW (ref 39.0–52.0)
Hemoglobin: 9.3 g/dL — ABNORMAL LOW (ref 13.0–17.0)
MCH: 32.5 pg (ref 26.0–34.0)
MCHC: 34.2 g/dL (ref 30.0–36.0)
MCV: 95.1 fL (ref 80.0–100.0)
Platelets: 451 10*3/uL — ABNORMAL HIGH (ref 150–400)
RBC: 2.86 MIL/uL — ABNORMAL LOW (ref 4.22–5.81)
RDW: 13 % (ref 11.5–15.5)
WBC: 11.7 10*3/uL — ABNORMAL HIGH (ref 4.0–10.5)
nRBC: 0 % (ref 0.0–0.2)

## 2023-02-15 LAB — MAGNESIUM: Magnesium: 1.9 mg/dL (ref 1.7–2.4)

## 2023-02-15 MED ORDER — DEXTROSE 50 % IV SOLN
12.5000 g | INTRAVENOUS | Status: AC
  Administered 2023-02-15: 12.5 g via INTRAVENOUS

## 2023-02-15 MED ORDER — IOHEXOL 300 MG/ML  SOLN
100.0000 mL | Freq: Once | INTRAMUSCULAR | Status: AC | PRN
  Administered 2023-02-15: 100 mL via ORAL

## 2023-02-15 MED ORDER — DEXTROSE 50 % IV SOLN
12.5000 g | INTRAVENOUS | Status: AC
  Filled 2023-02-15: qty 50

## 2023-02-15 MED ORDER — DEXTROSE 50 % IV SOLN
INTRAVENOUS | Status: AC
  Filled 2023-02-15: qty 50

## 2023-02-15 NOTE — Plan of Care (Signed)
  Problem: Metabolic: Goal: Ability to maintain appropriate glucose levels will improve 02/15/2023 0049 by Cottie Banda, Mardelle Matte, RN Outcome: Not Progressing 02/15/2023 0049 by Cottie Banda, Mardelle Matte, RN Outcome: Not Progressing   Problem: Nutritional: Goal: Maintenance of adequate nutrition will improve 02/15/2023 0049 by Cottie Banda, Mardelle Matte, RN Outcome: Not Progressing 02/15/2023 0049 by Cottie Banda, Mardelle Matte, RN Outcome: Not Progressing   Problem: Nutritional: Goal: Progress toward achieving an optimal weight will improve 02/15/2023 0049 by Maryjean Morn, RN Outcome: Not Progressing 02/15/2023 0049 by Cottie Banda, Mardelle Matte, RN Outcome: Not Progressing   Problem: Skin Integrity: Goal: Risk for impaired skin integrity will decrease 02/15/2023 0049 by Maryjean Morn, RN Outcome: Not Progressing 02/15/2023 0049 by Cottie Banda, Mardelle Matte, RN Outcome: Not Progressing   Problem: Tissue Perfusion: Goal: Adequacy of tissue perfusion will improve 02/15/2023 0049 by Maryjean Morn, RN Outcome: Not Progressing 02/15/2023 0049 by Cottie Banda, Mardelle Matte, RN Outcome: Not Progressing

## 2023-02-15 NOTE — Progress Notes (Signed)
Subjective: Having some swelling of the neck but no difficulty breathing.  Swallowing has not been affected.  Objective: Vital signs in last 24 hours: Temp:  [98 F (36.7 C)-99.9 F (37.7 C)] 99.9 F (37.7 C) (06/24 0715) Pulse Rate:  [60-69] 65 (06/24 0751) Resp:  [16-20] 18 (06/24 0751) BP: (115-126)/(47-49) 126/47 (06/24 0715) SpO2:  [96 %-99 %] 98 % (06/24 0751) FiO2 (%):  [21 %-28 %] 28 % (06/24 0751) Weight:  [58.9 kg] 58.9 kg (06/24 0400) Weight change: 2.5 kg Last BM Date : 02/13/23  Intake/Output from previous day: 06/23 0701 - 06/24 0700 In: -  Out: 240 [Urine:240] Intake/Output this shift: No intake/output data recorded.  PHYSICAL EXAM: Awake and alert.  Stoma is healthy and clear.  Neck is swollen.  I removed a couple of staples from the left side of the neck and opened up into the surgical bed with scissors.  A large amount of air escape.  There is no liquid.  I placed some Curlex packing and a dressing.  All of the swelling deflated.  Lab Results: Recent Labs    02/14/23 0353 02/15/23 0230  WBC 12.0* 11.7*  HGB 9.8* 9.3*  HCT 28.5* 27.2*  PLT 459* 451*   BMET Recent Labs    02/14/23 0353 02/15/23 0230  NA 132* 129*  K 4.6 4.8  CL 97* 96*  CO2 26 25  GLUCOSE 169* 190*  BUN 19 25*  CREATININE 0.70 0.59*  CALCIUM 8.4* 8.2*    Studies/Results: No results found.  Medications: I have reviewed the patient's current medications.  Assessment/Plan: Air in the neck, concerning for air leak in the pharyngeal closure.  Will keep him n.p.o. for now and get a barium swallow soon as possible.  Not ready for discharge yet.  Elevation of the white blood cell count.  Will start IV antibiotics.  LOS: 10 days      Serena Colonel 02/15/2023, 11:18 AM

## 2023-02-15 NOTE — Progress Notes (Signed)
I reviewed the barium swallow.  I do not see a leak but I am waiting for the official reading from radiology.  Neck looks much better.  It has not reaccumulated air or any other material.  Packing was replaced.  Stable, await results of the study.  Possibly resume oral liquids tomorrow.

## 2023-02-15 NOTE — Plan of Care (Signed)
  Problem: Education: Goal: Knowledge of General Education information will improve Description: Including pain rating scale, medication(s)/side effects and non-pharmacologic comfort measures Outcome: Progressing   Problem: Health Behavior/Discharge Planning: Goal: Ability to manage health-related needs will improve Outcome: Progressing   Problem: Activity: Goal: Ability to tolerate increased activity will improve Outcome: Progressing   Problem: Nutrition: Goal: Maintenance of adequate nutrition will improve Outcome: Progressing   Problem: Clinical Measurements: Goal: Complications related to the disease process, condition or treatment will be avoided or minimized Outcome: Progressing   Problem: Respiratory: Goal: Will regain and/or maintain adequate ventilation Outcome: Progressing   Problem: Metabolic: Goal: Ability to maintain appropriate glucose levels will improve Outcome: Not Progressing   Problem: Skin Integrity: Goal: Risk for impaired skin integrity will decrease Outcome: Not Progressing   Problem: Tissue Perfusion: Goal: Adequacy of tissue perfusion will improve Outcome: Not Progressing

## 2023-02-15 NOTE — Progress Notes (Signed)
PROGRESS NOTE  [name] XBM:[id number] DOB: 15-Sep-1950   PCP: [name], MD  Patient is from: Home  DOA: 02/05/2023 LOS: 10  Chief complaints No chief complaint on file.    Brief Narrative / Interim history: 72 y.o. M with hx smoking, laryngeal SCC s/p tracheostomy, DM on insulin pump, PVD s/p left BKA in 2017, right fem-pop bypass in 2021, CAD never PCI, HLD, and HTN who presented for laryngectomy.  In April 2024, admitted for hoarseness, stridor, found to have laryngeal mass.  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   Doing well with tracheostomy since, presented 6/14 for elective total laryngectomy.  Hospitalist service consulted 6/17 for medical management, titration of insulin while on tube feed.    Subjective: Seen and examined earlier this morning.  No major events overnight of this morning.  Concern about irregularity in pharyngeal closure.  Made n.p.o. pending barium swallow eval.  Objective: Vitals:   02/15/23 0400 02/15/23 0715 02/15/23 0751 02/15/23 1125  BP:  (!) 126/47    Pulse:  62 65 64  Resp:  16 18 18   Temp:  99.9 F (37.7 C)    TempSrc:  Oral    SpO2:  97% 98% 97%  Weight: 58.9 kg     Height:        Examination:  GENERAL: No apparent distress.  Nontoxic. HEENT: MMM.  Vision and hearing grossly intact.  NG tube for feeding NECK: Laryngectomy tube in place. RESP:  No IWOB.  Fair aeration bilaterally. CVS:  RRR. Heart sounds normal.  ABD/GI/GU: BS+. Abd soft, NTND.  MSK/EXT:  Moves extremities. No apparent deformity. No edema.  SKIN: no apparent skin lesion or wound NEURO: Awake, alert and oriented appropriately.  No apparent focal neuro deficit. PSYCH: Calm. Normal affect.   Procedures:  S/p total laryngectomy and bilateral neck dissection on 6/14 by Dr.  Pollyann Kennedy.  Microbiology summarized: MRSA PCR screen nonreactive.  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 Pollyann Kennedy. Invasive moderately differentiated squamous cell carcinoma, negative margins and LNs.  Concern about air leak in pharyngeal closure. -N.p.o. pending barium swallow per ENT   T1DM on insulin pump: HbA1c 8%.  Recent Labs  Lab 02/14/23 2000 02/14/23 2358 02/15/23 0359 02/15/23 0759 02/15/23 1212  GLUCAP 165* 176* 234* 216* 179*  -On moderate SSI every 4 hours -Continue NovoLog 5 units every 4 hours -Continue Semglee 20 units daily -Will change to ACHS once off tube feed to p.o.   Acute respiratory failure with hypoxia due to obstructing mass s/p tracheostomy: On room air. Chronic COPD: Stable. -Continue pulmicort, brovana, prn duoneb.    HTN: Normotensive without directed Tx.    Depression: Stable -Continue SSRI   HLD:  -Continue rosuvastatin    PVD, history of osteomyelitis s/p left BKA -Holding ASA 81mg  per ENT. Continue statin -Continue routine vascular surgery follow up  Severe malnutrition Body mass index is 18.11 kg/m. Nutrition Problem: Severe Malnutrition Etiology: chronic illness (laryngeal tumor, COPD) Signs/Symptoms: severe fat depletion, severe muscle depletion, percent weight loss (10% weight loss in < 3 months) Percent weight loss: 10 % Interventions: Tube feeding, Refer to RD note  for recommendations   DVT prophylaxis:  SCDs Start: 02/05/23 1431  Code Status: Full code Family Communication: None at bedside Level of care: Med-Surg Status is: Inpatient   Final disposition: Per primary team. Consultants:  Hospitalist  35 minutes with more than 50% spent in reviewing records, counseling patient/family  and coordinating care.   Sch Meds:  Scheduled Meds:  arformoterol  15 mcg Nebulization BID   bacitracin  1 Application Topical Q8H   budesonide (PULMICORT) nebulizer solution  0.5 mg Nebulization BID   Chlorhexidine Gluconate Cloth  6 each Topical Daily   docusate  100 mg Per Tube Daily   feeding supplement (GLUCERNA SHAKE)  237 mL Oral TID BM   feeding supplement (PROSource TF20)  60 mL Per Tube Daily   insulin aspart  0-15 Units Subcutaneous Q4H   insulin aspart  5 Units Subcutaneous Q4H   insulin glargine-yfgn  20 Units Subcutaneous Daily   melatonin  3 mg Per Tube QHS   mouth rinse  15 mL Mouth Rinse 4 times per day   rosuvastatin  10 mg Per Tube Daily   senna-docusate  1 tablet Per Tube QHS   sertraline  50 mg Per Tube QHS   Continuous Infusions:  sodium chloride Stopped (02/06/23 1951)   feeding supplement (JEVITY 1.2 CAL) 1,000 mL (02/15/23 0015)   PRN Meds:.sodium chloride, acetaminophen, hydrOXYzine, ipratropium-albuterol, ondansetron (ZOFRAN) IV, mouth rinse, oxyCODONE  Antimicrobials: Anti-infectives (From admission, onward)    Start     Dose/Rate Route Frequency Ordered Stop   02/05/23 1530  Ampicillin-Sulbactam (UNASYN) 3 g in sodium chloride 0.9 % 100 mL IVPB        3 g 200 mL/hr over 30 Minutes Intravenous Every 6 hours 02/05/23 1430 02/05/23 1619   02/05/23 0800  Ampicillin-Sulbactam (UNASYN) 3 g in sodium chloride 0.9 % 100 mL IVPB        3 g 200 mL/hr over 30 Minutes Intravenous To Surgery 02/05/23 0759 02/06/23 0700        I have personally reviewed the following labs and images: CBC: Recent Labs  Lab 02/10/23 0051 02/12/23 0552 02/14/23 0353 02/15/23 0230  WBC 6.1 7.8 12.0* 11.7*  HGB 9.6* 10.3* 9.8* 9.3*  HCT 27.9* 30.1* 28.5* 27.2*  MCV 91.8 93.5 92.2 95.1  PLT 330 394 459* 451*   BMP &GFR Recent Labs  Lab 02/08/23 1743 02/09/23 0048 02/10/23 0051 02/11/23 1028 02/12/23 0552 02/14/23 0353 02/15/23 0230  NA  --    < > 131* 132*  133* 132* 129*  K  --    < > 4.7 4.9 4.5 4.6 4.8  CL  --    < > 98 99 99 97* 96*  CO2  --    < > 28 24 27 26 25   GLUCOSE  --    < > 166* 177* 112* 169* 190*  BUN  --    < > 17 17 22 19  25*  CREATININE  --    < > 0.61 0.70 0.72 0.70 0.59*  CALCIUM  --    < > 8.2* 8.5* 8.6* 8.4* 8.2*  MG 2.0  --   --   --   --  1.9 1.9  PHOS 4.2  --   --   --   --  3.7 3.6   < > = values in this interval not displayed.   Estimated Creatinine Clearance: 71.6 mL/min (A) (by C-G formula based on SCr of 0.59 mg/dL (L)). Liver & Pancreas: Recent Labs  Lab 02/14/23 0353 02/15/23 0230  ALBUMIN 2.3* 2.1*   No results for input(s): "LIPASE", "AMYLASE" in the last 168 hours. No results for input(s): "AMMONIA" in the last 168 hours. Diabetic: No results for input(s): "HGBA1C" in the last 72 hours. Recent Labs  Lab 02/14/23 2000 02/14/23 2358 02/15/23 0359 02/15/23 0759 02/15/23 1212  GLUCAP 165* 176* 234* 216* 179*   Cardiac Enzymes: No results for input(s): "CKTOTAL", "CKMB", "CKMBINDEX", "TROPONINI" in the last 168 hours. No results for input(s): "PROBNP" in the last 8760 hours. Coagulation Profile: No results for input(s): "INR", "PROTIME" in the last 168 hours. Thyroid Function Tests: No results for input(s): "TSH", "T4TOTAL", "FREET4", "T3FREE", "THYROIDAB" in the last 72 hours. Lipid Profile: No results for input(s): "CHOL", "HDL", "LDLCALC", "TRIG", "CHOLHDL", "LDLDIRECT" in the last 72 hours. Anemia Panel: No results for input(s): "VITAMINB12", "FOLATE", "FERRITIN", "TIBC", "IRON", "RETICCTPCT" in the last 72 hours. Urine analysis:    Component Value Date/Time   COLORURINE YELLOW 10/22/2015 1307   APPEARANCEUR CLEAR 10/22/2015 1307   LABSPEC 1.011 10/22/2015 1307   PHURINE 5.0 10/22/2015 1307   GLUCOSEU >1000 (A) 10/22/2015 1307   HGBUR NEGATIVE 10/22/2015 1307   BILIRUBINUR NEGATIVE 10/22/2015 1307   KETONESUR NEGATIVE 10/22/2015 1307   PROTEINUR NEGATIVE 10/22/2015 1307   NITRITE  NEGATIVE 10/22/2015 1307   LEUKOCYTESUR NEGATIVE 10/22/2015 1307   Sepsis Labs: Invalid input(s): "PROCALCITONIN", "LACTICIDVEN"  Microbiology: Recent Results (from the past 240 hour(s))  MRSA Next Gen by PCR, Nasal     Status: None   Collection Time: 02/05/23  2:55 PM   Specimen: Nasal Mucosa; Nasal Swab  Result Value Ref Range Status   MRSA by PCR Next Gen NOT DETECTED NOT DETECTED Final    Comment: (NOTE) The GeneXpert MRSA Assay (FDA approved for NASAL specimens only), is one component of a comprehensive MRSA colonization surveillance program. It is not intended to diagnose MRSA infection nor to guide or monitor treatment for MRSA infections. Test performance is not FDA approved in patients less than 96 years old. Performed at [facility], [address] [place], [place] [address]     Radiology Studies: No results found.    Edison Nicholson T. Ignacia Gentzler [facility]  If 7PM-7AM, please contact night-coverage [email or web address] 02/15/2023, 12:55 PM

## 2023-02-15 NOTE — Progress Notes (Signed)
SLP Cancellation Note  Patient Details Name: [name] MRN: [id number] DOB: [birth year]   Cancelled treatment:    Pt out of room for procedure. Will follow along.  Taya Ashbaugh L. Samson Frederic, MA CCC/SLP Clinical Specialist - Acute Care SLP Acute Rehabilitation Services Office number [phone]        [name] 02/15/2023, 1:21 PM

## 2023-02-15 NOTE — Progress Notes (Signed)
   02/15/23 2113  RT Progression Team  O2 Device Room Air  SpO2 94 %  Laryngectomy  Placement Date/Time: 02/05/23 1113   Placed By: (c) Other (Comment)  Status Secured  Site Assessment (Peristomal Skin) Intact (Mild-Mod Bleeding from Neck Area (Stitches))  Site Care (Peristomal Skin) Open to air;Cleansed  Laryngectomy Tube/Button Care Cleansed, dried, and replaced  Ties Assessment Clean;Dry  Laryngectomy Safety Checklist "Total laryngectomy/neck breather" sign at head of bed

## 2023-02-15 NOTE — Progress Notes (Signed)
Dr. Pollyann Kennedy notified regarding gauze packing removed and patient having bleeding from left side of neck. Bleeding resolved, dry dressing applied by primary nurse. Sterile saline and small kerlix provided at bedside per request for Dr. Pollyann Kennedy to evaluate patient.

## 2023-02-15 NOTE — Care Management Important Message (Signed)
Important Message  Patient Details  Name: [name] MRN: [id number] Date of Birth: 02/05/1951   Medicare Important Message Given:  Yes     Sherilyn Banker 02/15/2023, 2:31 PM

## 2023-02-16 ENCOUNTER — Inpatient Hospital Stay (HOSPITAL_COMMUNITY): Payer: Medicare Other

## 2023-02-16 DIAGNOSIS — J387 Other diseases of larynx: Secondary | ICD-10-CM | POA: Diagnosis not present

## 2023-02-16 DIAGNOSIS — E109 Type 1 diabetes mellitus without complications: Secondary | ICD-10-CM | POA: Diagnosis not present

## 2023-02-16 DIAGNOSIS — Z9002 Acquired absence of larynx: Secondary | ICD-10-CM | POA: Diagnosis not present

## 2023-02-16 DIAGNOSIS — C329 Malignant neoplasm of larynx, unspecified: Secondary | ICD-10-CM | POA: Diagnosis not present

## 2023-02-16 LAB — RENAL FUNCTION PANEL
Albumin: 2.2 g/dL — ABNORMAL LOW (ref 3.5–5.0)
Anion gap: 8 (ref 5–15)
BUN: 21 mg/dL (ref 8–23)
CO2: 26 mmol/L (ref 22–32)
Calcium: 8.3 mg/dL — ABNORMAL LOW (ref 8.9–10.3)
Chloride: 98 mmol/L (ref 98–111)
Creatinine, Ser: 0.72 mg/dL (ref 0.61–1.24)
GFR, Estimated: >60 mL/min (ref >60)
Glucose, Bld: 113 mg/dL — ABNORMAL HIGH (ref 70–99)
Phosphorus: 3.9 mg/dL (ref 2.5–4.6)
Potassium: 4.7 mmol/L (ref 3.5–5.1)
Sodium: 132 mmol/L — ABNORMAL LOW (ref 135–145)

## 2023-02-16 LAB — CBC
HCT: 27.2 % — ABNORMAL LOW (ref 39.0–52.0)
Hemoglobin: 9.3 g/dL — ABNORMAL LOW (ref 13.0–17.0)
MCH: 31.7 pg (ref 26.0–34.0)
MCHC: 34.2 g/dL (ref 30.0–36.0)
MCV: 92.8 fL (ref 80.0–100.0)
Platelets: 502 10*3/uL — ABNORMAL HIGH (ref 150–400)
RBC: 2.93 MIL/uL — ABNORMAL LOW (ref 4.22–5.81)
RDW: 13 % (ref 11.5–15.5)
WBC: 12.8 10*3/uL — ABNORMAL HIGH (ref 4.0–10.5)
nRBC: 0 % (ref 0.0–0.2)

## 2023-02-16 LAB — GLUCOSE, CAPILLARY
Glucose-Capillary: 274 mg/dL — ABNORMAL HIGH (ref 70–99)
Glucose-Capillary: 349 mg/dL — ABNORMAL HIGH (ref 70–99)
Glucose-Capillary: 337 mg/dL — ABNORMAL HIGH (ref 70–99)
Glucose-Capillary: 57 mg/dL — ABNORMAL LOW (ref 70–99)
Glucose-Capillary: 61 mg/dL — ABNORMAL LOW (ref 70–99)
Glucose-Capillary: 63 mg/dL — ABNORMAL LOW (ref 70–99)
Glucose-Capillary: 72 mg/dL (ref 70–99)
Glucose-Capillary: 134 mg/dL — ABNORMAL HIGH (ref 70–99)

## 2023-02-16 LAB — MAGNESIUM: Magnesium: 2 mg/dL (ref 1.7–2.4)

## 2023-02-16 LAB — TSH: TSH: 0.158 u[IU]/mL — ABNORMAL LOW (ref 0.350–4.500)

## 2023-02-16 MED ORDER — INSULIN ASPART 100 UNIT/ML IJ SOLN: 0.0000 [IU] | Freq: Every day | INTRAMUSCULAR | Status: DC

## 2023-02-16 MED ORDER — INSULIN ASPART 100 UNIT/ML IJ SOLN
0.0000 [IU] | Freq: Three times a day (TID) | INTRAMUSCULAR | Status: DC
  Administered 2023-02-17: 8 [IU] via SUBCUTANEOUS

## 2023-02-16 MED ORDER — POLYETHYLENE GLYCOL 3350 17 G PO PACK
17.0000 g | PACK | Freq: Two times a day (BID) | ORAL | Status: DC
  Filled 2023-02-16: qty 1

## 2023-02-16 MED ORDER — INSULIN ASPART 100 UNIT/ML IJ SOLN
4.0000 [IU] | Freq: Three times a day (TID) | INTRAMUSCULAR | Status: DC
  Administered 2023-02-17: 4 [IU] via SUBCUTANEOUS

## 2023-02-16 MED ORDER — SENNOSIDES 8.8 MG/5ML PO SYRP
10.0000 mL | ORAL_SOLUTION | Freq: Two times a day (BID) | ORAL | Status: DC
  Filled 2023-02-16 (×2): qty 10

## 2023-02-16 NOTE — Plan of Care (Signed)
  Problem: Education: Goal: Knowledge of General Education information will improve Description: Including pain rating scale, medication(s)/side effects and non-pharmacologic comfort measures Outcome: Progressing   Problem: Health Behavior/Discharge Planning: Goal: Ability to manage health-related needs will improve Outcome: Progressing   Problem: Clinical Measurements: Goal: Ability to maintain clinical measurements within normal limits will improve Outcome: Progressing Goal: Will remain free from infection Outcome: Progressing Goal: Diagnostic test results will improve Outcome: Progressing Goal: Respiratory complications will improve Outcome: Progressing Goal: Cardiovascular complication will be avoided Outcome: Progressing   Problem: Activity: Goal: Risk for activity intolerance will decrease Outcome: Progressing   Problem: Nutrition: Goal: Adequate nutrition will be maintained Outcome: Progressing   Problem: Coping: Goal: Level of anxiety will decrease Outcome: Progressing   Problem: Elimination: Goal: Will not experience complications related to bowel motility Outcome: Progressing Goal: Will not experience complications related to urinary retention Outcome: Progressing   Problem: Pain Managment: Goal: General experience of comfort will improve Outcome: Progressing   Problem: Safety: Goal: Ability to remain free from injury will improve Outcome: Progressing   Problem: Skin Integrity: Goal: Risk for impaired skin integrity will decrease Outcome: Progressing   Problem: Education: Goal: Ability to describe self-care measures that may prevent or decrease complications (Diabetes Survival Skills Education) will improve Outcome: Progressing Goal: Individualized Educational Video(s) Outcome: Progressing   Problem: Coping: Goal: Ability to adjust to condition or change in health will improve Outcome: Progressing   Problem: Fluid Volume: Goal: Ability to  maintain a balanced intake and output will improve Outcome: Progressing   Problem: Health Behavior/Discharge Planning: Goal: Ability to identify and utilize available resources and services will improve Outcome: Progressing Goal: Ability to manage health-related needs will improve Outcome: Progressing   Problem: Metabolic: Goal: Ability to maintain appropriate glucose levels will improve Outcome: Progressing   Problem: Nutritional: Goal: Maintenance of adequate nutrition will improve Outcome: Progressing Goal: Progress toward achieving an optimal weight will improve Outcome: Progressing   Problem: Skin Integrity: Goal: Risk for impaired skin integrity will decrease Outcome: Progressing   Problem: Tissue Perfusion: Goal: Adequacy of tissue perfusion will improve Outcome: Progressing   Problem: Education: Goal: Knowledge of the prescribed therapeutic regimen will improve Outcome: Progressing   Problem: Activity: Goal: Ability to tolerate increased activity will improve Outcome: Progressing   Problem: Health Behavior/Discharge Planning: Goal: Identification of resources available to assist in meeting health care needs will improve Outcome: Progressing   Problem: Nutrition: Goal: Maintenance of adequate nutrition will improve Outcome: Progressing   Problem: Clinical Measurements: Goal: Complications related to the disease process, condition or treatment will be avoided or minimized Outcome: Progressing   Problem: Respiratory: Goal: Will regain and/or maintain adequate ventilation Outcome: Progressing   Problem: Skin Integrity: Goal: Demonstration of wound healing without infection will improve Outcome: Progressing   

## 2023-02-16 NOTE — Progress Notes (Signed)
No new swelling.  Significant serous material on the dressing.  Dressing changed.  NG feeding tube is out.  He is tolerating liquid diet well.  If he continues to do well we can potentially discharge him home tomorrow on liquid diet.  I am going to try to obtain some elastic netting to use as a pressure dressing for the neck.

## 2023-02-16 NOTE — Plan of Care (Signed)
  Problem: Education: Goal: Knowledge of General Education information will improve Description: Including pain rating scale, medication(s)/side effects and non-pharmacologic comfort measures Outcome: Progressing   Problem: Health Behavior/Discharge Planning: Goal: Ability to manage health-related needs will improve Outcome: Progressing   Problem: Clinical Measurements: Goal: Ability to maintain clinical measurements within normal limits will improve Outcome: Progressing Goal: Will remain free from infection Outcome: Progressing Goal: Diagnostic test results will improve Outcome: Progressing Goal: Respiratory complications will improve Outcome: Progressing Goal: Cardiovascular complication will be avoided Outcome: Progressing   Problem: Activity: Goal: Risk for activity intolerance will decrease Outcome: Progressing   Problem: Nutrition: Goal: Adequate nutrition will be maintained Outcome: Progressing   Problem: Coping: Goal: Level of anxiety will decrease Outcome: Progressing   Problem: Elimination: Goal: Will not experience complications related to bowel motility Outcome: Progressing Goal: Will not experience complications related to urinary retention Outcome: Progressing   Problem: Pain Managment: Goal: General experience of comfort will improve Outcome: Progressing   Problem: Safety: Goal: Ability to remain free from injury will improve Outcome: Progressing   Problem: Skin Integrity: Goal: Risk for impaired skin integrity will decrease Outcome: Progressing   Problem: Skin Integrity: Goal: Risk for impaired skin integrity will decrease Outcome: Progressing   Problem: Tissue Perfusion: Goal: Adequacy of tissue perfusion will improve Outcome: Progressing   Problem: Activity: Goal: Ability to tolerate increased activity will improve Outcome: Progressing   Problem: Health Behavior/Discharge Planning: Goal: Identification of resources available to assist  in meeting health care needs will improve Outcome: Progressing   Problem: Nutrition: Goal: Maintenance of adequate nutrition will improve Outcome: Progressing   Problem: Clinical Measurements: Goal: Complications related to the disease process, condition or treatment will be avoided or minimized Outcome: Progressing   Problem: Respiratory: Goal: Will regain and/or maintain adequate ventilation Outcome: Progressing   Problem: Skin Integrity: Goal: Demonstration of wound healing without infection will improve Outcome: Progressing

## 2023-02-16 NOTE — Progress Notes (Signed)
Occupational Therapy Treatment/Discharge Patient Details Name: [name] MRN: [id number] DOB: [birth year] Today's Date: 02/16/2023   History of present illness Pt is 72 year old presented [facility] on  02/05/23 for laryngeal CA. Underwent total laryngectomy on 02/05/23 PMH - tracheostomy 12/10/22, type 1 diabetes mellitus, recent COPD diagnosis, coronary artery disease, hyperlipidemia, hypertension and L BKA   OT comments  Pt in bed upon therapy arrival . Discussed therapy goals and pt's safety while completing BADL tasks. Pt reports that he is independent in his room. If he is hooked to feeding supplement he'll ask to be disconnected prior to walking to bathroom, around room, etc. Pt verbalized good insight regarding safety awareness and judgement. Pt has met all acute OT goals at this time and will be discharged. If any issues arise prior to discharge OT can be consulted for a re-eval. Pt agrees with recommendation and discharge at this time.    Recommendations for follow up therapy are one component of a multi-disciplinary discharge planning process, led by the attending physician.  Recommendations may be updated based on patient status, additional functional criteria and insurance authorization.    Assistance Recommended at Discharge PRN  Patient can return home with the following  Assist for transportation   Equipment Recommendations  None recommended by OT       Precautions / Restrictions Precautions Precautions: None Precaution Comments: Pt reports that he is independent and does not require staff assistance to ambulate or complete BADL tasks. No bed alarm on upon therapy arrival. Restrictions Weight Bearing Restrictions: No       Mobility Bed Mobility Overal bed mobility: Modified Independent     Transfers Overall transfer level: Needs assistance Equipment used: None Transfers: Sit to/from Stand, Bed to chair/wheelchair/BSC Sit to Stand: Modified independent  (Device/Increase time)     Step pivot transfers: Modified independent (Device/Increase time)     General transfer comment: sit to stand from bed completed without device. RW not used when navigating within room and out in hallway. Pt used foot board lightly when walking around towards room although did not use external support to maintain balance walking from room to bench by large window in hallway.     Balance Overall balance assessment: Mild deficits observed, not formally tested         ADL either performed or assessed with clinical judgement   ADL Overall ADL's : Modified independent        Cognition Arousal/Alertness: Awake/alert Behavior During Therapy: Flat affect Overall Cognitive Status: Within Functional Limits for tasks assessed       General Comments: Utilized dry erase board to communicate with therapist and answer questions.                   Pertinent Vitals/ Pain       Pain Assessment Pain Assessment: Faces Faces Pain Scale: No hurt            Progress Toward Goals  OT Goals(current goals can now be found in the care plan section)  Progress towards OT goals: Goals met/education completed, patient discharged from OT     Plan All goals met and education completed, patient discharged from OT services       AM-PAC OT "6 Clicks" Daily Activity     Outcome Measure   Help from another person eating meals?: Total (cortrak) Help from another person taking care of personal grooming?: None Help from another person toileting, which includes using toliet, bedpan, or urinal?: None Help  from another person bathing (including washing, rinsing, drying)?: None Help from another person to put on and taking off regular upper body clothing?: None Help from another person to put on and taking off regular lower body clothing?: None 6 Click Score: 21    End of Session    OT Visit Diagnosis: Muscle weakness (generalized) (M62.81)   Activity Tolerance Patient  tolerated treatment well   Patient Left in bed;with call bell/phone within reach           Time: 4034-7425 OT Time Calculation (min): 8 min  Charges: OT General Charges $OT Visit: 1 Visit OT Treatments $Therapeutic Activity: 8-22 mins  Limmie Patricia, OTR/L,CBIS  Supplemental OT - [facility] and [facility] Secure Chat Preferred    Cherrise Occhipinti, Charisse March 02/16/2023, 1:21 PM

## 2023-02-16 NOTE — Inpatient Diabetes Management (Signed)
Inpatient Diabetes Program Recommendations  AACE/ADA: New Consensus Statement on Inpatient Glycemic Control (2015)  Target Ranges:  Prepandial:   less than 140 mg/dL      Peak postprandial:   less than 180 mg/dL (1-2 hours)      Critically ill patients:  140 - 180 mg/dL   Lab Results  Component Value Date   GLUCAP 349 (H) 02/16/2023   HGBA1C 8.0 (H) 12/09/2022    Review of Glycemic Control  Latest Reference Range & Units 02/15/23 07:59 02/15/23 12:12 02/15/23 16:59 02/15/23 17:40 02/15/23 20:14 02/15/23 23:57 02/16/23 03:48 02/16/23 08:20  Glucose-Capillary 70 - 99 mg/dL 010 (H) 272 (H) 58 (L) 106 (H) 104 (H) 86 274 (H) 349 (H)   Diabetes history: DM type 1 Outpatient Diabetes medications: Medtronic insulin pump with Novolog insulin; total basal 20.7 units/day, 1:50 mg/dl correction, 5:36 grams carb coverage  Current orders for Inpatient glycemic control:  Semglee 20 units Daily Novolog 0-15 units Q4 hours Novolog 5 units Q4 hours Tube Feed Coverage  Glucerna tid between meals  Jevity 1.2  65 ml/hour  Inpatient Diabetes Program Recommendations:    -   Consider increasing Novolog Tube Feed Coverage to 8 units.  Thanks,  Christena Deem RN, MSN, BC-ADM Inpatient Diabetes Coordinator Team Pager [phone] (8a-5p)

## 2023-02-16 NOTE — Progress Notes (Signed)
Spoke with [employer] and confirmed with them that Johnell Comings, patient's daughter, will be the primary contact for supplies. [facility] has received the prescription form and it has been processed.

## 2023-02-16 NOTE — Progress Notes (Signed)
Subjective: No new problems overnight, neck is feeling swollen again.  Objective: Vital signs in last 24 hours: Temp:  [98.7 F (37.1 C)-100.4 F (38 C)] 98.7 F (37.1 C) (06/25 0822) Pulse Rate:  [61-77] 65 (06/25 0822) Resp:  [16-20] 18 (06/25 0822) BP: (119-130)/(46-51) 130/46 (06/25 0822) SpO2:  [92 %-98 %] 97 % (06/25 0822) FiO2 (%):  [28 %-30 %] 30 % (06/25 0206) Weight:  [58 kg] 58 kg (06/25 0349) Weight change: -0.9 kg Last BM Date : 02/13/23  Intake/Output from previous day: 06/24 0701 - 06/25 0700 In: -  Out: 100 [Urine:100] Intake/Output this shift: No intake/output data recorded.  PHYSICAL EXAM: Awake and alert.  Neck swollen.  Packing removed and area and serous fluid was produced.  No saliva or pus.  Packing replaced.  Stoma looks good.  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 Patient developed left neck swelling with subcutaneous emphysema. Evaluate for postop leak or perforation. EXAM: ESOPHAGUS/BARIUM SWALLOW/TABLET STUDY TECHNIQUE: Single contrast examination was performed using water soluble contrast. This exam was performed by Alwyn Ren, NP, and was supervised and interpreted by Dr. Eppie Gibson. FLUOROSCOPY: Radiation Exposure Index (as provided by the fluoroscopic device): 0.60 mGy Kerma COMPARISON:  None FINDINGS: Swallowing: Feeding tube is seen in place. Appears normal. No vestibular penetration or aspiration seen. Pharynx: Unremarkable. No contrast leak or  extravasation identified. Esophagus: Normal appearance.  No contrast leak identified. Ingested 13mm barium tablet: Not given. Other: None. IMPRESSION: No evidence of pharyngeal or esophageal perforation. Electronically Signed   By: Danae Orleans M.D.   On: 02/15/2023 14:22    Medications: I have reviewed the patient's current medications.  Assessment/Plan: Continued swelling and air and serous fluid accumulation in the neck wound.  Recommend recheck TSH to make sure his thyroid function is adequate.  Check a chest x-ray make sure there is no pneumothorax that may be the source of the air.  Will start him back on oral liquids.  I reviewed the swallowing study and there is no leak.  If he tolerates that well may remove nasogastric feeding tube tomorrow.  LOS: 11 days      Serena Colonel 02/16/2023, 8:46 AM

## 2023-02-16 NOTE — Progress Notes (Signed)
PT Cancellation Note  Patient Details Name: [name] MRN: [id number] DOB: 04-Jun-1951   Cancelled Treatment:    Reason Eval/Treat Not Completed: Other (comment) Politely declining progressive amb at this time; Rough night; Requests PT to return tomorrow;   Van Clines, PT  Acute Rehabilitation Services Office [phone] Secure Chat welcomed    [name] 02/16/2023, 11:28 AM

## 2023-02-16 NOTE — Progress Notes (Signed)
Speech Pathology: Pt back on full liquid diet today; cortrak removed.  Spoke with his daughter, [name], in the hall and we discussed importance of OP SLP f/u to address communication. [name] has not been very interested in using electrolarynx in the immediate aftermath of his surgery, which is not unusual.  Once D/Cd, it will be important to find a reliable method of communication. She agreed.  SLP will follow.  [name] Gamarra L. Samson Frederic, MA CCC/SLP Clinical Specialist - Acute Care SLP Acute Rehabilitation Services Office number [phone]

## 2023-02-16 NOTE — Progress Notes (Addendum)
PROGRESS NOTE  [name] QMV:[id number] DOB: 09/12/1950   PCP: [name], MD  Patient is from: Home  DOA: 02/05/2023 LOS: 11  Chief complaints No chief complaint on file.    Brief Narrative / Interim history: 72 y.o. M with hx smoking, laryngeal SCC s/p tracheostomy, DM on insulin pump, PVD s/p left BKA in 2017, right fem-pop bypass in 2021, CAD never PCI, HLD, and HTN who presented for laryngectomy.  In April 2024, admitted for hoarseness, stridor, found to have laryngeal mass.  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   Doing well with tracheostomy since, presented 6/14 for elective total laryngectomy.  Hospitalist service consulted 6/17 for medical management, titration of insulin while on tube feed.  Patient is off tube feed and on full liquid diet as of 6/25.  Changed CBG and SSI to ACHS.    Subjective: Seen and examined earlier this morning.  No major events overnight of this morning.  No complaints  Objective: Vitals:   02/16/23 0758 02/16/23 0822 02/16/23 1128 02/16/23 1513  BP:  (!) 130/46    Pulse: 65 65 64 64  Resp: 20 18 18 18   Temp:  98.7 F (37.1 C)    TempSrc:  Oral    SpO2: 95% 97% 94% 96%  Weight:      Height:        Examination:  GENERAL: No apparent distress.  Nontoxic. HEENT: MMM.  Vision and hearing grossly intact.  NECK: Laryngectomy tube in place. RESP:  No IWOB.  Fair aeration bilaterally. CVS:  RRR. Heart sounds normal.  ABD/GI/GU: BS+. Abd soft, NTND.  MSK/EXT:  Moves extremities.  Left BKA.  Prosthesis in place. SKIN: no apparent skin lesion or wound NEURO: Awake, alert and oriented appropriately.  No apparent focal neuro deficit. PSYCH: Calm. Normal affect.   Procedures:  S/p total laryngectomy and bilateral neck dissection on 6/14 by Dr.  Pollyann Kennedy.  Microbiology summarized: MRSA PCR screen nonreactive.  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 Pollyann Kennedy. Invasive moderately differentiated squamous cell carcinoma, negative margins and LNs.  Concern about air leak in pharyngeal closure but barium swallow without leak.  CXR without pneumothorax. -Tube feed and cortrack discontinued. -Full liquid diet resumed.   T1DM on insulin pump: HbA1c 8%.  Now off tube feed on full liquid diet.  Anticipate improvement in CBG now he is off tube feed. Recent Labs  Lab 02/15/23 1740 02/15/23 2014 02/15/23 2357 02/16/23 0348 02/16/23 0820  GLUCAP 106* 104* 86 274* 349*  -Change SSI and meal coverage from every 4 hours to ACHS. -Continue Semglee 20 units daily -Diabetic coordinator consulted for insulin pump   Acute respiratory failure with hypoxia due to obstructing mass s/p tracheostomy: On room air. Chronic COPD: Stable. -Continue pulmicort, brovana, prn duoneb.    HTN: Normotensive without directed Tx.    Depression: Stable -Continue SSRI   HLD:  -Continue rosuvastatin    PVD, history of osteomyelitis s/p left BKA -Aspirin on hold per ENT.  Continue statin. -Continue routine vascular surgery follow up  Severe malnutrition Body mass index is 17.83 kg/m. Nutrition Problem: Severe Malnutrition Etiology: chronic illness (  laryngeal tumor, COPD) Signs/Symptoms: severe fat depletion, severe muscle depletion, percent weight loss (10% weight loss in < 3 months) Percent weight loss: 10 % Interventions: Tube feeding, Refer to RD note for recommendations   DVT prophylaxis:  SCDs Start: 02/05/23 1431  Code Status: Full code Family Communication: None at bedside Level of care: Med-Surg Status is:  Inpatient   Final disposition: Per primary team, ENT. Consultants:  Hospitalist  35 minutes with more than 50% spent in reviewing records, counseling patient/family and coordinating care.   Sch Meds:  Scheduled Meds:  arformoterol  15 mcg Nebulization BID   bacitracin  1 Application Topical Q8H   budesonide (PULMICORT) nebulizer solution  0.5 mg Nebulization BID   Chlorhexidine Gluconate Cloth  6 each Topical Daily   dextrose  12.5 g Intravenous STAT   docusate  100 mg Per Tube Daily   feeding supplement (GLUCERNA SHAKE)  237 mL Oral TID BM   feeding supplement (PROSource TF20)  60 mL Per Tube Daily   insulin aspart  0-15 Units Subcutaneous TID WC   insulin aspart  0-5 Units Subcutaneous QHS   insulin aspart  4 Units Subcutaneous TID WC   insulin glargine-yfgn  20 Units Subcutaneous Daily   melatonin  3 mg Per Tube QHS   mouth rinse  15 mL Mouth Rinse 4 times per day   rosuvastatin  10 mg Per Tube Daily   senna-docusate  1 tablet Per Tube QHS   sertraline  50 mg Per Tube QHS   Continuous Infusions:  sodium chloride Stopped (02/06/23 1951)   PRN Meds:.sodium chloride, acetaminophen, hydrOXYzine, ipratropium-albuterol, ondansetron (ZOFRAN) IV, mouth rinse, oxyCODONE  Antimicrobials: Anti-infectives (From admission, onward)    Start     Dose/Rate Route Frequency Ordered Stop   02/05/23 1530  Ampicillin-Sulbactam (UNASYN) 3 g in sodium chloride 0.9 % 100 mL IVPB        3 g 200 mL/hr over 30 Minutes Intravenous Every 6 hours 02/05/23 1430 02/05/23 1619   02/05/23 0800  Ampicillin-Sulbactam (UNASYN) 3 g in sodium chloride 0.9 % 100 mL IVPB        3 g 200 mL/hr over 30 Minutes Intravenous To Surgery 02/05/23 0759 02/06/23 0700        I have personally reviewed the following labs and images: CBC: Recent Labs  Lab 02/10/23 0051 02/12/23 0552 02/14/23 0353 02/15/23 0230 02/16/23 0038  WBC 6.1 7.8 12.0* 11.7* 12.8*  HGB 9.6* 10.3* 9.8* 9.3* 9.3*  HCT 27.9* 30.1*  28.5* 27.2* 27.2*  MCV 91.8 93.5 92.2 95.1 92.8  PLT 330 394 459* 451* 502*   BMP &GFR Recent Labs  Lab 02/11/23 1028 02/12/23 0552 02/14/23 0353 02/15/23 0230 02/16/23 0038  NA 132* 133* 132* 129* 132*  K 4.9 4.5 4.6 4.8 4.7  CL 99 99 97* 96* 98  CO2 24 27 26 25 26   GLUCOSE 177* 112* 169* 190* 113*  BUN 17 22 19  25* 21  CREATININE 0.70 0.72 0.70 0.59* 0.72  CALCIUM 8.5* 8.6* 8.4* 8.2* 8.3*  MG  --   --  1.9 1.9 2.0  PHOS  --   --  3.7 3.6 3.9   Estimated Creatinine Clearance: 70.5 mL/min (by C-G formula based on SCr of 0.72 mg/dL). Liver & Pancreas: Recent Labs  Lab 02/14/23 0353 02/15/23 0230 02/16/23 0038  ALBUMIN 2.3* 2.1* 2.2*   No results for input(s): "LIPASE", "AMYLASE" in the last 168 hours. No results for input(s): "AMMONIA" in the last 168 hours. Diabetic:  No results for input(s): "HGBA1C" in the last 72 hours. Recent Labs  Lab 02/15/23 1740 02/15/23 2014 02/15/23 2357 02/16/23 0348 02/16/23 0820  GLUCAP 106* 104* 86 274* 349*   Cardiac Enzymes: No results for input(s): "CKTOTAL", "CKMB", "CKMBINDEX", "TROPONINI" in the last 168 hours. No results for input(s): "PROBNP" in the last 8760 hours. Coagulation Profile: No results for input(s): "INR", "PROTIME" in the last 168 hours. Thyroid Function Tests: Recent Labs    02/16/23 0038  TSH 0.158*   Lipid Profile: No results for input(s): "CHOL", "HDL", "LDLCALC", "TRIG", "CHOLHDL", "LDLDIRECT" in the last 72 hours. Anemia Panel: No results for input(s): "VITAMINB12", "FOLATE", "FERRITIN", "TIBC", "IRON", "RETICCTPCT" in the last 72 hours. Urine analysis:    Component Value Date/Time   COLORURINE YELLOW 10/22/2015 1307   APPEARANCEUR CLEAR 10/22/2015 1307   LABSPEC 1.011 10/22/2015 1307   PHURINE 5.0 10/22/2015 1307   GLUCOSEU >1000 (A) 10/22/2015 1307   HGBUR NEGATIVE 10/22/2015 1307   BILIRUBINUR NEGATIVE 10/22/2015 1307   KETONESUR NEGATIVE 10/22/2015 1307   PROTEINUR NEGATIVE 10/22/2015  1307   NITRITE NEGATIVE 10/22/2015 1307   LEUKOCYTESUR NEGATIVE 10/22/2015 1307   Sepsis Labs: Invalid input(s): "PROCALCITONIN", "LACTICIDVEN"  Microbiology: No results found for this or any previous visit (from the past 240 hour(s)).   Radiology Studies: DG Chest 2 View  Result Date: 02/16/2023 CLINICAL DATA:  History of laryngectomy. EXAM: CHEST - 2 VIEW COMPARISON:  Chest radiograph 12/15/2022 FINDINGS: The tracheostomy tube has been removed. The enteric catheter tip is in the stomach. Linear opacities in the lateral left base likely reflect atelectasis or scar. There is no other focal airspace opacity. There is no pulmonary edema. There is no pleural effusion or pneumothorax. There is no acute osseous abnormality. IMPRESSION: Left basilar atelectasis or scar. Otherwise, no focal airspace opacity. Electronically Signed   By: Lesia Hausen M.D.   On: 02/16/2023 11:30      Brie Eppard T. [employer] [employer] [facility]  If 7PM-7AM, please contact night-coverage [email or web address] 02/16/2023, 4:12 PM

## 2023-02-17 DIAGNOSIS — E109 Type 1 diabetes mellitus without complications: Secondary | ICD-10-CM | POA: Diagnosis not present

## 2023-02-17 DIAGNOSIS — C329 Malignant neoplasm of larynx, unspecified: Secondary | ICD-10-CM | POA: Diagnosis not present

## 2023-02-17 DIAGNOSIS — J387 Other diseases of larynx: Secondary | ICD-10-CM | POA: Diagnosis not present

## 2023-02-17 DIAGNOSIS — Z9002 Acquired absence of larynx: Secondary | ICD-10-CM | POA: Diagnosis not present

## 2023-02-17 LAB — GLUCOSE, CAPILLARY
Glucose-Capillary: 173 mg/dL — ABNORMAL HIGH (ref 70–99)
Glucose-Capillary: 199 mg/dL — ABNORMAL HIGH (ref 70–99)
Glucose-Capillary: 254 mg/dL — ABNORMAL HIGH (ref 70–99)

## 2023-02-17 LAB — RENAL FUNCTION PANEL
Albumin: 2.2 g/dL — ABNORMAL LOW (ref 3.5–5.0)
Anion gap: 10 (ref 5–15)
BUN: 25 mg/dL — ABNORMAL HIGH (ref 8–23)
CO2: 26 mmol/L (ref 22–32)
Calcium: 8.4 mg/dL — ABNORMAL LOW (ref 8.9–10.3)
Chloride: 95 mmol/L — ABNORMAL LOW (ref 98–111)
Creatinine, Ser: 0.78 mg/dL (ref 0.61–1.24)
GFR, Estimated: >60 mL/min (ref >60)
Glucose, Bld: 191 mg/dL — ABNORMAL HIGH (ref 70–99)
Phosphorus: 4 mg/dL (ref 2.5–4.6)
Potassium: 4.9 mmol/L (ref 3.5–5.1)
Sodium: 131 mmol/L — ABNORMAL LOW (ref 135–145)

## 2023-02-17 LAB — CBC
HCT: 27.4 % — ABNORMAL LOW (ref 39.0–52.0)
Hemoglobin: 9.3 g/dL — ABNORMAL LOW (ref 13.0–17.0)
MCH: 31.2 pg (ref 26.0–34.0)
MCHC: 33.9 g/dL (ref 30.0–36.0)
MCV: 91.9 fL (ref 80.0–100.0)
Platelets: 552 10*3/uL — ABNORMAL HIGH (ref 150–400)
RBC: 2.98 MIL/uL — ABNORMAL LOW (ref 4.22–5.81)
RDW: 13.1 % (ref 11.5–15.5)
WBC: 12.6 10*3/uL — ABNORMAL HIGH (ref 4.0–10.5)
nRBC: 0 % (ref 0.0–0.2)

## 2023-02-17 LAB — MAGNESIUM: Magnesium: 1.9 mg/dL (ref 1.7–2.4)

## 2023-02-17 MED ORDER — CLINDAMYCIN HCL 300 MG PO CAPS: 300.0000 mg | ORAL_CAPSULE | Freq: Three times a day (TID) | ORAL | 0 refills | Status: DC

## 2023-02-17 NOTE — Progress Notes (Signed)
Discharge instructions reviewed with pt and his daughter.  Copy of instructions and printed script given to pt/daughter. Both verbalized/acknowledged understanding of the instructions, printed information/handouts given, questions answered, dressing supplies already given to pt by Spero Geralds whom had done education with pt and daughter on his larytube.   Pt d/c'd via wheelchair with belongings, with his daugher.             Escorted by staff.   Annice Needy, RN SWOT

## 2023-02-17 NOTE — Progress Notes (Signed)
PROGRESS NOTE    [name]  ZOX:[id number] DOB: [birth year] DOA: 02/05/2023 PCP: [name], MD   Brief Narrative:  [name] is a 72 y.o. M with hx smoking, laryngeal CA s/p tracheostomy, DM on insulin pump, PVD s/p left BKA in 2017, right fem-pop bypass in 2021, CAD never PCI, HLD, and HTN who presented for laryngectomy.  In April 2024 admitted for hoarseness, stridor, found to have laryngeal mass.  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  Doing well with tracheostomy since, presented 6/14 for elective total laryngectomy.  Hospitalist service consulted 6/17 for medical management now that he is transferring [facility]. 6/14: Admitted for laryngectomy 6/17: Transferred to the floor. 6/18: Overnight NG tube got dislodged, replaced by IR.  Back on basal bolus regimen for insulin and off of IV fluids.  **Interim History Blood sugars improved once Cortrak was discontinued.  Distress was consulted and assisted with further recommendations.  Patient will be medically stable for discharge and he recommended follow-up with PCP within 1 to 2 weeks.  ENT is planning on checking the patient on Friday for wound check and has instructed the patient's daughter on how to apply the pressure dressing patient is to remain on a strict liquid diet for now.  Assessment and Plan:  Laryngeal squamous cell cancer -S/p total laryngectomy and bilateral neck dissection on 6/14 by Dr. Pollyann Kennedy. Invasive moderately differentiated squamous cell carcinoma, negative margins and LNs.  Concern about air leak in pharyngeal closure but barium swallow without leak.  CXR without pneumothorax. -Tube feed and cortrack discontinued. -Full liquid diet resumed and will remain on it for now -Further care per ENT   T1DM on insulin pump:  -HbA1c 8.0%  on last check.   -Now off tube feed on full liquid diet.  Anticipate improvement in CBG now he is off tube feed. -CBG and Glucose Trend: Recent Labs  Lab 02/10/23 0051 02/10/23 0335 02/11/23 1028 02/11/23 1210 02/12/23 0552 02/12/23 0826 02/14/23 0353 02/14/23 0520 02/15/23 0230 02/15/23 0359 02/16/23 0038 02/16/23 0348 02/17/23 0024 02/17/23 0432 02/17/23 0753  GLUCOSE 166*  --  177*  --  112*  --  169*  --  190*  --  113*  --  191*  --   --   GLUCAP  --    < >  --    < >  --    < >  --    < >  --    < >  --    < >  --    < > 254*   < > = values in this interval not displayed.  -Change SSI and meal coverage from every 4 hours to ACHS. -Continue Semglee 20 units daily -Diabetic coordinator consulted for insulin pump   Acute Respiratory Failure with Hypoxia due to obstructing mass s/p tracheostomy. Chronic COPD -Stable. SpO2: 96 % O2 Flow Rate (L/min): 6 L/min FiO2 (%): 30 %; NOW currently on Room Air -Continue Arformoterol 15 mcg Neb BID, Budesonide 0.5 mg Neb BID, and DuoNeb 3 mL Neb BIDprn Wheezing and SOB   HTN -Normotensive without directed Tx. -Continue to Monitor BP per Protocol  -Last BP reading was 131/53    Depression: Stable -Continue SSRI with Sertraline 50 mg per Tube qHS   HLD:  -Continue Rosuvastatin 10 mg per Tube Daily    PVD, History  of osteomyelitis s/p left BKA -Aspirin on hold per ENT.  Continue statin. -Continue routine vascular surgery follow up -Resume ASA per ENT   Severe Malnutrition Nutrition Status: Nutrition Problem: Severe Malnutrition Etiology: chronic illness (laryngeal tumor, COPD) Signs/Symptoms: severe fat depletion, severe muscle depletion, percent weight loss (10% weight loss in < 3 months) Percent weight loss: 10 % Interventions: Tube feeding, Refer to RD note for recommendations   DVT prophylaxis: SCDs    Code Status: Full Code Family Communication: Discussed with the patient's daughter at bedside  Disposition Plan:   Level of care: Med-Surg  Consultants:  TRH  Procedures:  As delineated as above  Antimicrobials:  Anti-infectives (From admission, onward)    Start     Dose/Rate Route Frequency Ordered Stop   02/17/23 0000  clindamycin (CLEOCIN) 300 MG capsule        300 mg Oral 3 times daily 02/17/23 1025     02/05/23 1530  Ampicillin-Sulbactam (UNASYN) 3 g in sodium chloride 0.9 % 100 mL IVPB        3 g 200 mL/hr over 30 Minutes Intravenous Every 6 hours 02/05/23 1430 02/05/23 1619   02/05/23 0800  Ampicillin-Sulbactam (UNASYN) 3 g in sodium chloride 0.9 % 100 mL IVPB        3 g 200 mL/hr over 30 Minutes Intravenous To Surgery 02/05/23 0759 02/06/23 0700       Subjective: Seen and examined at bedside and states that he did not get any sleep last night.  Doing fairly well and ENT is planning on sending him home today.  No other concerns or questions this time and I communicated with the patient with the writing tablet..  Objective: Vitals:   02/16/23 2044 02/17/23 0434 02/17/23 0741 02/17/23 0904  BP: 131/62 (!) 131/53 (!) 131/53   Pulse: 60 60 64 76  Resp: 18 18 16 18   Temp: 98.8 F (37.1 C) 98 F (36.7 C) 98.7 F (37.1 C)   TempSrc: Oral Oral Oral   SpO2: 99% 93% 95% 96%  Weight:  56.5 kg    Height:        Intake/Output Summary (Last 24 hours) at 02/17/2023 1401 Last data filed at 02/17/2023 0900 Gross per 24 hour  Intake 1000.25 ml  Output --  Net 1000.25 ml   Filed Weights   02/15/23 0400 02/16/23 0349 02/17/23 0434  Weight: 58.9 kg 58 kg 56.5 kg   Examination: Physical Exam:  Constitutional: Thin chronically ill-appearing Caucasian male in no acute distress Respiratory: Diminished to auscultation bilaterally, no wheezing, rales, rhonchi or crackles. Normal respiratory effort and patient is not tachypenic. No accessory muscle use.  Unlabored breathing Cardiovascular: RRR, no murmurs / rubs / gallops. S1 and S2 auscultated.  Abdomen: Soft, non-tender, non-distended.   Bowel sounds positive.  GU: Deferred. Musculoskeletal: Has a left BKA Skin: No rashes, lesions, ulcers on limited skin evaluation. No induration; Warm and dry.  Neurologic: CN 2-12 grossly intact with no focal deficits really speak so communicates via tablet.  Romberg sign cerebellar reflexes not assessed.  Psychiatric: Normal judgment and insight. Alert and oriented x 3. Normal mood and appropriate affect.   Data Reviewed: I have personally reviewed following labs and imaging studies  CBC: Recent Labs  Lab 02/12/23 0552 02/14/23 0353 02/15/23 0230 02/16/23 0038 02/17/23 0024  WBC 7.8 12.0* 11.7* 12.8* 12.6*  HGB 10.3* 9.8* 9.3* 9.3* 9.3*  HCT 30.1* 28.5* 27.2* 27.2* 27.4*  MCV 93.5 92.2 95.1 92.8 91.9  PLT 394 459*  451* 502* 552*   Basic Metabolic Panel: Recent Labs  Lab 02/12/23 0552 02/14/23 0353 02/15/23 0230 02/16/23 0038 02/17/23 0024  NA 133* 132* 129* 132* 131*  K 4.5 4.6 4.8 4.7 4.9  CL 99 97* 96* 98 95*  CO2 27 26 25 26 26   GLUCOSE 112* 169* 190* 113* 191*  BUN 22 19 25* 21 25*  CREATININE 0.72 0.70 0.59* 0.72 0.78  CALCIUM 8.6* 8.4* 8.2* 8.3* 8.4*  MG  --  1.9 1.9 2.0 1.9  PHOS  --  3.7 3.6 3.9 4.0   GFR: Estimated Creatinine Clearance: 68.7 mL/min (by C-G formula based on SCr of 0.78 mg/dL). Liver Function Tests: Recent Labs  Lab 02/14/23 0353 02/15/23 0230 02/16/23 0038 02/17/23 0024  ALBUMIN 2.3* 2.1* 2.2* 2.2*   No results for input(s): "LIPASE", "AMYLASE" in the last 168 hours. No results for input(s): "AMMONIA" in the last 168 hours. Coagulation Profile: No results for input(s): "INR", "PROTIME" in the last 168 hours. Cardiac Enzymes: No results for input(s): "CKTOTAL", "CKMB", "CKMBINDEX", "TROPONINI" in the last 168 hours. BNP (last 3 results) No results for input(s): "PROBNP" in the last 8760 hours. HbA1C: No results for input(s): "HGBA1C" in the last 72 hours. CBG: Recent Labs  Lab 02/16/23 1838 02/16/23 2049 02/17/23 0011  02/17/23 0432 02/17/23 0753  GLUCAP 72 134* 199* 173* 254*   Lipid Profile: No results for input(s): "CHOL", "HDL", "LDLCALC", "TRIG", "CHOLHDL", "LDLDIRECT" in the last 72 hours. Thyroid Function Tests: Recent Labs    02/16/23 0038  TSH 0.158*   Anemia Panel: No results for input(s): "VITAMINB12", "FOLATE", "FERRITIN", "TIBC", "IRON", "RETICCTPCT" in the last 72 hours. Sepsis Labs: No results for input(s): "PROCALCITON", "LATICACIDVEN" in the last 168 hours.  No results found for this or any previous visit (from the past 240 hour(s)).   Radiology Studies: DG Chest 2 View  Result Date: 02/16/2023 CLINICAL DATA:  History of laryngectomy. EXAM: CHEST - 2 VIEW COMPARISON:  Chest radiograph 12/15/2022 FINDINGS: The tracheostomy tube has been removed. The enteric catheter tip is in the stomach. Linear opacities in the lateral left base likely reflect atelectasis or scar. There is no other focal airspace opacity. There is no pulmonary edema. There is no pleural effusion or pneumothorax. There is no acute osseous abnormality. IMPRESSION: Left basilar atelectasis or scar. Otherwise, no focal airspace opacity. Electronically Signed   By: Lesia Hausen M.D.   On: 02/16/2023 11:30    Scheduled Meds:  arformoterol  15 mcg Nebulization BID   bacitracin  1 Application Topical Q8H   budesonide (PULMICORT) nebulizer solution  0.5 mg Nebulization BID   Chlorhexidine Gluconate Cloth  6 each Topical Daily   docusate  100 mg Per Tube Daily   feeding supplement (GLUCERNA SHAKE)  237 mL Oral TID BM   feeding supplement (PROSource TF20)  60 mL Per Tube Daily   insulin aspart  0-15 Units Subcutaneous TID WC   insulin aspart  0-5 Units Subcutaneous QHS   insulin aspart  4 Units Subcutaneous TID WC   insulin glargine-yfgn  20 Units Subcutaneous Daily   melatonin  3 mg Per Tube QHS   mouth rinse  15 mL Mouth Rinse 4 times per day   polyethylene glycol  17 g Oral BID   rosuvastatin  10 mg Per Tube Daily    sennosides  10 mL Oral BID   sertraline  50 mg Per Tube QHS   Continuous Infusions:  sodium chloride Stopped (02/06/23 1951)  LOS: 12 days   Marguerita Merles, DO [facility] Available via Epic secure chat 7am-7pm After these hours, please refer to coverage provider listed on [email or web address] 02/17/2023, 2:01 PM

## 2023-02-17 NOTE — TOC Transition Note (Addendum)
Transition of Care [facility]) - CM/SW Discharge Note   Patient Details  Name: [name] MRN: [id number] Date of Birth: 1950/12/04  Transition of Care [facility]) CM/SW Contact:  Lawerance Sabal, RN Phone Number: 02/17/2023, 10:50 AM   Clinical Narrative:     Reviewed DC plan with family and Dr Pollyann Kennedy at bedside. Daughter able to do dressing change, and HH services are not needed.  They would prefer to have OP SLP at [facility]. Referral placed and I also called to schedule the appointment, added to AVS and informed daughter at bedside.  Texted patient's daughter Jeanice Lim local Laryngectomy support group sites at their request.  Confirmed w Spero Geralds that patient is ok to DC from her end, and Rx form for supplies has been processed.   Final next level of care: Home/Self Care Barriers to Discharge: No Barriers Identified   Patient Goals and CMS Choice CMS Medicare.gov Compare Post Acute Care list provided to:: Patient Choice offered to / list presented to : Patient  Discharge Placement                         Discharge Plan and Services Additional resources added to the After Visit Summary for     Discharge Planning Services: CM Consult Post Acute Care Choice: Home Health                    HH Arranged: PT, OT, Nurse's Aide, RN [facility] Agency: [facility] Date [facility] Agency Contacted: 02/08/23 Time HH Agency Contacted: [phone] Representative spoke with at [facility] Agency: Tresa Endo  Social Determinants of Health (SDOH) Interventions SDOH Screenings   Food Insecurity: No Food Insecurity (12/09/2022)  Housing: Low Risk  (12/09/2022)  Transportation Needs: No Transportation Needs (12/09/2022)  Utilities: Not At Risk (12/09/2022)  Depression (PHQ2-9): Low Risk  (01/04/2023)  Tobacco Use: Medium Risk (02/06/2023)     Readmission Risk Interventions    02/08/2023    1:34 PM 12/17/2022    5:12 PM 12/17/2022    2:58 PM  Readmission Risk Prevention Plan  Transportation Screening  Complete  Complete  PCP or Specialist Appt within 5-7 Days Complete    Home Care Screening Complete Complete Complete  Medication Review (RN CM) Referral to Pharmacy  Complete

## 2023-02-17 NOTE — Discharge Summary (Signed)
Physician Discharge Summary  Patient ID: [name] MRN: [id number] DOB/AGE: Apr 18, 1951 72 y.o.  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  This was treated with opening the incision and packing the wound and a pressure dressing.  Consults: Hospitalist service  Significant Diagnostic Studies: Barium swallow  Treatments: surgery: Total laryngectomy with bilateral selective neck dissection.  Discharge Exam: Blood pressure (!) 131/53, pulse 76, temperature 98.7 F (37.1 C), temperature source Oral, resp. rate 18, height 5\' 11"  (1.803 m), weight 56.5 kg, SpO2 96 %. PHYSICAL EXAM: Awake and alert.  Pressure dressing on neck.  Stoma looks healthy.  Serous drainage from the wound.  Swallowing well.  Breathing without difficulty.  Disposition: Discharge disposition: 01-Home or Self Care       Discharge Instructions     Ambulatory referral to Occupational Therapy   Complete by: As directed    Ambulatory referral to Physical Therapy   Complete by: As directed    Ambulatory referral to Speech Therapy   Complete by: As directed    Diet full liquid   Complete by: As directed    Increase activity slowly   Complete by: As directed       Allergies as of 02/17/2023       Reactions   Simvastatin Other (See Comments)   MYALGIAS, MUSCLE WEAKNESS        Medication List     TAKE these medications    acetaminophen 325 MG tablet Commonly known as: TYLENOL Take 2 tablets (650 mg total) by mouth every 6 (six) hours as needed for mild pain (or Fever >/= 101).   albuterol 108 (90 Base) MCG/ACT inhaler Commonly known as: VENTOLIN HFA Inhale 1-2  puffs into the lungs every 6 (six) hours as needed.   aspirin EC 81 MG tablet Take 243 mg by mouth at bedtime.   baclofen 20 MG tablet Commonly known as: LIORESAL Take 20 mg by mouth daily as needed (for leg cramps). Reported on 12/05/2015   calcium carbonate 500 MG chewable tablet Commonly known as: TUMS - dosed in mg elemental calcium Chew 1,000-2,000 mg by mouth as needed for indigestion or heartburn.   clindamycin 300 MG capsule Commonly known as: Cleocin Take 1 capsule (300 mg total) by mouth 3 (three) times daily.   Dulera 200-5 MCG/ACT Aero Generic drug: mometasone-formoterol Inhale 2 puffs into the lungs 2 (two) times daily. What changed:  when to take this reasons to take this   folic acid 800 MCG tablet Commonly known as: FOLVITE Take 1,600 mcg by mouth at bedtime.   HumaLOG 100 UNIT/ML injection Generic drug: insulin lispro 2.1 mLs See admin instructions. Via PUMP Inject 2.1 ml into the pump when it runs out. Pt is unsure how often he fills his pump or the rate of his doses.   ibuprofen 200 MG tablet Commonly known as: ADVIL Take 200-400 mg by mouth every 8 (eight) hours as needed (pain.).   ipratropium-albuterol 0.5-2.5 (3) MG/3ML Soln Commonly known as: DUONEB Take 3 mLs by nebulization 2 (two) times daily. What changed:  when to take this reasons to take this   metoprolol succinate 25 MG 24 hr tablet Commonly known as: TOPROL-XL  Take 1 tablet (25 mg total) by mouth daily. What changed: when to take this   MULTIVITAMIN GUMMIES ADULT PO Take 3 tablets by mouth at bedtime.   ramipril 5 MG capsule Commonly known as: ALTACE Take 5 mg by mouth at bedtime.   rosuvastatin 10 MG tablet Commonly known as: CRESTOR TAKE 1 TABLET BY MOUTH  DAILY What changed: when to take this   sertraline 50 MG tablet Commonly known as: ZOLOFT Take 50 mg by mouth at bedtime.        Follow-up Information     [facility] Follow up on  03/01/2023.   Specialty: Rehabilitation Why: at 2:45 with Gavin Potters information: [address] [place], [address] [id number] mc [place] [address] [phone]        Serena Colonel, MD Follow up on 02/19/2023.   Specialty: Otolaryngology Why: Come to the office at 2:30 PM. Contact information: [address] [place] [address] [phone]                 Signed: Serena Colonel 02/17/2023, 10:27 AM

## 2023-02-17 NOTE — Progress Notes (Signed)
Subjective: No new problems.  Objective: Vital signs in last 24 hours: Temp:  [98 F (36.7 C)-98.8 F (37.1 C)] 98.7 F (37.1 C) (06/26 0741) Pulse Rate:  [56-64] 64 (06/26 0741) Resp:  [16-18] 16 (06/26 0741) BP: (101-131)/(52-62) 131/53 (06/26 0741) SpO2:  [93 %-99 %] 95 % (06/26 0741) Weight:  [56.5 kg] 56.5 kg (06/26 0434) Weight change: -1.5 kg Last BM Date : 02/13/23  Intake/Output from previous day: 06/25 0701 - 06/26 0700 In: 5913.4 [P.O.:240; NG/GT:5673.4] Out: -  Intake/Output this shift: No intake/output data recorded.  PHYSICAL EXAM: Awake and alert.  Packing changed.  Still having serous discharge.  Packing replaced and a dressing applied.  Pressure dressing applied with an Ace bandage.  Stoma looks excellent.  Lab Results: Recent Labs    02/16/23 0038 02/17/23 0024  WBC 12.8* 12.6*  HGB 9.3* 9.3*  HCT 27.2* 27.4*  PLT 502* 552*   BMET Recent Labs    02/16/23 0038 02/17/23 0024  NA 132* 131*  K 4.7 4.9  CL 98 95*  CO2 26 26  GLUCOSE 113* 191*  BUN 21 25*  CREATININE 0.72 0.78  CALCIUM 8.3* 8.4*    Studies/Results: DG Chest 2 View  Result Date: 02/16/2023 CLINICAL DATA:  History of laryngectomy. EXAM: CHEST - 2 VIEW COMPARISON:  Chest radiograph 12/15/2022 FINDINGS: The tracheostomy tube has been removed. The enteric catheter tip is in the stomach. Linear opacities in the lateral left base likely reflect atelectasis or scar. There is no other focal airspace opacity. There is no pulmonary edema. There is no pleural effusion or pneumothorax. There is no acute osseous abnormality. IMPRESSION: Left basilar atelectasis or scar. Otherwise, no focal airspace opacity. Electronically Signed   By: Lesia Hausen M.D.   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  Patient developed left neck swelling with subcutaneous emphysema. Evaluate for postop leak or perforation. EXAM: ESOPHAGUS/BARIUM SWALLOW/TABLET STUDY TECHNIQUE: Single contrast examination was performed using water soluble contrast. This exam was performed by Alwyn Ren, NP, and was supervised and interpreted by Dr. Eppie Gibson. FLUOROSCOPY: Radiation Exposure Index (as provided by the fluoroscopic device): 0.60 mGy Kerma COMPARISON:  None FINDINGS: Swallowing: Feeding tube is seen in place. Appears normal. No vestibular penetration or aspiration seen. Pharynx: Unremarkable. No contrast leak or extravasation identified. Esophagus: Normal appearance.  No contrast leak identified. Ingested 13mm barium tablet: Not given. Other: None. IMPRESSION: No evidence of pharyngeal or esophageal perforation. Electronically Signed   By: Danae Orleans M.D.   On: 02/15/2023 14:22    Medications: I have reviewed the patient's current medications.  Assessment/Plan: Doing well.  If we can get his nutritional needs set up we can send him home today.  I will have him follow-up Friday to check on the wound.  I am going to instruct his daughter on how to apply the pressure dressing later this morning.  We will stay on a strict liquid diet.  LOS: 12 days       Serena Colonel 02/17/2023, 8:32 AM

## 2023-02-17 NOTE — Progress Notes (Addendum)
Pt has discharge order, pt waiting for dietician to come and do education on full liquid diet options, etc, pt on insulin pump at home and plans to resume that at home per MD notes.    Pt's daughter also to be instructed by MD on the dressing change per MD notes.   AVS medications filled in on the AVS, spoke with pt's nurse Evette and made her aware.  Pt needs above completed before instructions and sent home.   Annice Needy, RN SWOT   At [phone], spoke with pt's daughter, Careers adviser has shown her how to do dressing change. Waiting for the dietician to educate and discuss diet options.

## 2023-02-17 NOTE — Progress Notes (Signed)
Physical Therapy Treatment and Discharge  Patient Details Name: [name] MRN: [id number] DOB: 08-Jan-1951 Today's Date: 02/17/2023   History of Present Illness Pt is 72 year old presented to [facility] on  02/05/23 for laryngeal CA. Underwent total laryngectomy on 02/05/23 PMH - tracheostomy 12/10/22, type 1 diabetes mellitus, recent COPD diagnosis, coronary artery disease, hyperlipidemia, hypertension and L BKA    PT Comments    Continuing work on functional mobility and activity tolerance;  Moving well, and walking the hallways without the need for assistive device overall; a cane could help with gait symmetry, and pt agrees to get one; they do not deliver canes to the home, so pt plans to purchase one; Questions solicited and answered; Goals met, pt to dc home today  Recommendations for follow up therapy are one component of a multi-disciplinary discharge planning process, led by the attending physician.  Recommendations may be updated based on patient status, additional functional criteria and insurance authorization.  Follow Up Recommendations       Assistance Recommended at Discharge Intermittent Supervision/Assistance  Patient can return home with the following A little help with walking and/or transfers;A little help with bathing/dressing/bathroom;Assistance with cooking/housework   Equipment Recommendations  None recommended by PT    Recommendations for Other Services       Precautions / Restrictions Precautions Precautions: None Precaution Comments: Pt reports that he is independent and does not require staff assistance to ambulate or complete BADL tasks. No bed alarm on upon therapy arrival.     Mobility  Bed Mobility Overal bed mobility: Modified Independent                  Transfers Overall transfer level: Modified independent   Transfers: Sit to/from Stand Sit to Stand: Modified independent (Device/Increase time)           General transfer comment:  No difficulty with sit to stand; slight grimace with stand to sit; reorted a pulled muscle in his back    Ambulation/Gait Ambulation/Gait assistance: Modified independent (Device/Increase time) Gait Distance (Feet):  (Hallway amb) Assistive device: None Gait Pattern/deviations: Step-through pattern, Trunk flexed       General Gait Details: Occasionally reaching out to wallfor UE support   Stairs             Wheelchair Mobility    Modified Rankin (Stroke Patients Only)       Balance     Sitting balance-Leahy Scale: Good       Standing balance-Leahy Scale: Fair                              Cognition Arousal/Alertness: Awake/alert Behavior During Therapy: WFL for tasks assessed/performed Overall Cognitive Status: Within Functional Limits for tasks assessed                                          Exercises      General Comments General comments (skin integrity, edema, etc.): Used Notes app for communication      Pertinent Vitals/Pain Pain Assessment Pain Assessment: Faces Faces Pain Scale: Hurts a little bit Pain Location: neck and pulled muscle in his back Pain Descriptors / Indicators: Grimacing Pain Intervention(s): Monitored during session, Heat applied    Home Living  Prior Function            PT Goals (current goals can now be found in the care plan section) Acute Rehab PT Goals Patient Stated Goal: Wants to get his strength back; would like to walk daily PT Goal Formulation: With patient Time For Goal Achievement: 02/20/23 Potential to Achieve Goals: Good Progress towards PT goals: Goals met/education completed, patient discharged from PT    Frequency    Min 3X/week      PT Plan Current plan remains appropriate    Co-evaluation              AM-PAC PT "6 Clicks" Mobility   Outcome Measure  Help needed turning from your back to your side while in a flat bed  without using bedrails?: None Help needed moving from lying on your back to sitting on the side of a flat bed without using bedrails?: None Help needed moving to and from a bed to a chair (including a wheelchair)?: None Help needed standing up from a chair using your arms (e.g., wheelchair or bedside chair)?: None Help needed to walk in hospital room?: A Little Help needed climbing 3-5 steps with a railing? : A Little 6 Click Score: 22    End of Session Equipment Utilized During Treatment: Gait belt (and L prosthesis) Activity Tolerance: Patient tolerated treatment well Patient left: in bed;with call bell/phone within reach Nurse Communication: Mobility status PT Visit Diagnosis: Unsteadiness on feet (R26.81);Other abnormalities of gait and mobility (R26.89);Muscle weakness (generalized) (M62.81)     Time: 1610-9604 PT Time Calculation (min) (ACUTE ONLY): 20 min  Charges:  $Gait Training: 8-22 mins                     Van Clines, PT  Acute Rehabilitation Services Office [phone] Secure Chat welcomed    [name] 02/17/2023, 2:02 PM

## 2023-02-17 NOTE — Progress Notes (Signed)
Nutrition Brief Note   RD received a consult for diet education regarding full liquid diet at home. RD provided pt with "Full liquid Nutrition Therapy" handout. Reviewed with pt and daughter at bedside. Discussed what foods are full liquids. Provided list of foods recommend and not recommend. Encouraged pt to use oral nutrition supplements to meet calorie and protein needs after discharge. Provided pt with coupons for Ensure and Glucerna. Encouraged small frequent meals as well.   Pt to discharge home today.     Kirby Crigler RD, LDN Clinical Dietitian See Loretha Stapler for contact information.

## 2023-02-17 NOTE — Progress Notes (Signed)
During my visit, I helped [name] remove his larytube. Secretions now no longer blood tinged. Larytube cleaned and replaced, secured with larytube holder. New HME applied. Has a dressing to his left neck with ace wrap around his neck to secure gauze dressing, serous drainage on dressing. Dr. Pollyann Kennedy to instruct daughter, [name], on how to change the dressing today. According to Mr. [name], [name] will be staying with him through July 4.

## 2023-02-20 ENCOUNTER — Ambulatory Visit (HOSPITAL_COMMUNITY)
Admission: EM | Admit: 2023-02-20 | Discharge: 2023-02-20 | Disposition: A | Discharge status: Home / Self Care | Payer: Medicare Other | Source: Ambulatory Visit | Attending: Otolaryngology | Admitting: Otolaryngology

## 2023-02-20 ENCOUNTER — Inpatient Hospital Stay (HOSPITAL_COMMUNITY): Payer: Medicare Other | Admitting: Anesthesiology

## 2023-02-20 ENCOUNTER — Encounter (HOSPITAL_COMMUNITY): Payer: Self-pay | Admitting: Otolaryngology

## 2023-02-20 ENCOUNTER — Encounter (HOSPITAL_COMMUNITY)
Admission: EM | Disposition: A | Discharge status: Home / Self Care | Payer: Self-pay | Source: Ambulatory Visit | Attending: Otolaryngology

## 2023-02-20 ENCOUNTER — Other Ambulatory Visit: Payer: Self-pay

## 2023-02-20 DIAGNOSIS — Z794 Long term (current) use of insulin: Secondary | ICD-10-CM | POA: Insufficient documentation

## 2023-02-20 DIAGNOSIS — I10 Essential (primary) hypertension: Secondary | ICD-10-CM

## 2023-02-20 DIAGNOSIS — I2510 Atherosclerotic heart disease of native coronary artery without angina pectoris: Secondary | ICD-10-CM | POA: Diagnosis not present

## 2023-02-20 DIAGNOSIS — J449 Chronic obstructive pulmonary disease, unspecified: Secondary | ICD-10-CM | POA: Diagnosis not present

## 2023-02-20 DIAGNOSIS — Z87891 Personal history of nicotine dependence: Secondary | ICD-10-CM | POA: Insufficient documentation

## 2023-02-20 DIAGNOSIS — Z9641 Presence of insulin pump (external) (internal): Secondary | ICD-10-CM | POA: Diagnosis not present

## 2023-02-20 DIAGNOSIS — I252 Old myocardial infarction: Secondary | ICD-10-CM | POA: Diagnosis not present

## 2023-02-20 DIAGNOSIS — R221 Localized swelling, mass and lump, neck: Secondary | ICD-10-CM | POA: Insufficient documentation

## 2023-02-20 DIAGNOSIS — E1051 Type 1 diabetes mellitus with diabetic peripheral angiopathy without gangrene: Secondary | ICD-10-CM | POA: Diagnosis not present

## 2023-02-20 DIAGNOSIS — S1190XA Unspecified open wound of unspecified part of neck, initial encounter: Secondary | ICD-10-CM

## 2023-02-20 HISTORY — PX: INCISION AND DRAINAGE ABSCESS: SHX5864

## 2023-02-20 LAB — POCT I-STAT, CHEM 8
BUN: 22 mg/dL (ref 8–23)
Calcium, Ion: 1.22 mmol/L (ref 1.15–1.40)
Chloride: 99 mmol/L (ref 98–111)
Creatinine, Ser: 0.7 mg/dL (ref 0.61–1.24)
Glucose, Bld: 261 mg/dL — ABNORMAL HIGH (ref 70–99)
HCT: 29 % — ABNORMAL LOW (ref 39.0–52.0)
Hemoglobin: 9.9 g/dL — ABNORMAL LOW (ref 13.0–17.0)
Potassium: 5.2 mmol/L — ABNORMAL HIGH (ref 3.5–5.1)
Sodium: 131 mmol/L — ABNORMAL LOW (ref 135–145)
TCO2: 25 mmol/L (ref 22–32)

## 2023-02-20 LAB — GLUCOSE, CAPILLARY: Glucose-Capillary: 195 mg/dL — ABNORMAL HIGH (ref 70–99)

## 2023-02-20 SURGERY — INCISION AND DRAINAGE, ABSCESS
Anesthesia: General | Site: Neck

## 2023-02-20 MED ORDER — PROPOFOL 10 MG/ML IV BOLUS
INTRAVENOUS | Status: DC | PRN
  Administered 2023-02-20: 125 mg via INTRAVENOUS

## 2023-02-20 MED ORDER — ORAL CARE MOUTH RINSE: 15.0000 mL | Freq: Once | OROMUCOSAL | Status: DC

## 2023-02-20 MED ORDER — LACTATED RINGERS IV SOLN: INTRAVENOUS | Status: DC | PRN

## 2023-02-20 MED ORDER — BACITRACIN ZINC 500 UNIT/GM EX OINT
TOPICAL_OINTMENT | CUTANEOUS | Status: AC
  Filled 2023-02-20: qty 28.35

## 2023-02-20 MED ORDER — EPHEDRINE SULFATE-NACL 50-0.9 MG/10ML-% IV SOSY
PREFILLED_SYRINGE | INTRAVENOUS | Status: DC | PRN
  Administered 2023-02-20: 10 mg via INTRAVENOUS

## 2023-02-20 MED ORDER — LIDOCAINE-EPINEPHRINE 1 %-1:100000 IJ SOLN
INTRAMUSCULAR | Status: AC
  Filled 2023-02-20: qty 1

## 2023-02-20 MED ORDER — MIDAZOLAM HCL 2 MG/2ML IJ SOLN
INTRAMUSCULAR | Status: DC | PRN
  Administered 2023-02-20: 2 mg via INTRAVENOUS

## 2023-02-20 MED ORDER — ONDANSETRON HCL 4 MG/2ML IJ SOLN
INTRAMUSCULAR | Status: DC | PRN
  Administered 2023-02-20: 4 mg via INTRAVENOUS

## 2023-02-20 MED ORDER — INSULIN ASPART 100 UNIT/ML IJ SOLN
5.0000 [IU] | Freq: Once | INTRAMUSCULAR | Status: AC
  Administered 2023-02-20: 5 [IU] via SUBCUTANEOUS

## 2023-02-20 MED ORDER — LIDOCAINE 2% (20 MG/ML) 5 ML SYRINGE
INTRAMUSCULAR | Status: DC | PRN
  Administered 2023-02-20: 30 mg via INTRAVENOUS

## 2023-02-20 MED ORDER — CHLORHEXIDINE GLUCONATE 0.12 % MT SOLN: 15.0000 mL | Freq: Once | OROMUCOSAL | Status: DC

## 2023-02-20 MED ORDER — FENTANYL CITRATE (PF) 250 MCG/5ML IJ SOLN
INTRAMUSCULAR | Status: AC
  Filled 2023-02-20: qty 5

## 2023-02-20 MED ORDER — ROCURONIUM BROMIDE 10 MG/ML (PF) SYRINGE
PREFILLED_SYRINGE | INTRAVENOUS | Status: DC | PRN
  Administered 2023-02-20: 30 mg via INTRAVENOUS

## 2023-02-20 MED ORDER — LACTATED RINGERS IV SOLN: INTRAVENOUS | Status: DC

## 2023-02-20 MED ORDER — 0.9 % SODIUM CHLORIDE (POUR BTL) OPTIME
TOPICAL | Status: DC | PRN
  Administered 2023-02-20: 1000 mL

## 2023-02-20 MED ORDER — MIDAZOLAM HCL 2 MG/2ML IJ SOLN
INTRAMUSCULAR | Status: AC
  Filled 2023-02-20: qty 2

## 2023-02-20 MED ORDER — BACITRACIN ZINC 500 UNIT/GM EX OINT
TOPICAL_OINTMENT | CUTANEOUS | Status: DC | PRN
  Administered 2023-02-20: 1 via TOPICAL

## 2023-02-20 MED ORDER — FENTANYL CITRATE (PF) 250 MCG/5ML IJ SOLN
INTRAMUSCULAR | Status: DC | PRN
  Administered 2023-02-20: 50 ug via INTRAVENOUS

## 2023-02-20 MED ORDER — PROPOFOL 10 MG/ML IV BOLUS
INTRAVENOUS | Status: AC
  Filled 2023-02-20: qty 20

## 2023-02-20 MED ORDER — SUGAMMADEX SODIUM 200 MG/2ML IV SOLN
INTRAVENOUS | Status: DC | PRN
  Administered 2023-02-20: 200 mg via INTRAVENOUS

## 2023-02-20 SURGICAL SUPPLY — 56 items
BAG COUNTER SPONGE SURGICOUNT (BAG) ×2 IMPLANT
BAG SPNG CNTER NS LX DISP (BAG) ×1
BLADE SURG 15 STRL LF DISP TIS (BLADE) IMPLANT
BLADE SURG 15 STRL SS (BLADE)
BNDG CMPR 75X21 PLY HI ABS (MISCELLANEOUS)
BNDG GAUZE DERMACEA FLUFF 4 (GAUZE/BANDAGES/DRESSINGS) IMPLANT
BNDG GZE DERMACEA 4 6PLY (GAUZE/BANDAGES/DRESSINGS)
CANISTER SUCT 3000ML PPV (MISCELLANEOUS) IMPLANT
CATH ROBINSON RED A/P 16FR (CATHETERS) IMPLANT
CLEANER TIP ELECTROSURG 2X2 (MISCELLANEOUS) ×2 IMPLANT
CNTNR URN SCR LID CUP LEK RST (MISCELLANEOUS) ×2 IMPLANT
CONT SPEC 4OZ STRL OR WHT (MISCELLANEOUS)
CORD BIPOLAR FORCEPS 12FT (ELECTRODE) ×2 IMPLANT
COVER SURGICAL LIGHT HANDLE (MISCELLANEOUS) ×2 IMPLANT
DRAIN JACKSON PRATT 10MM FLAT (MISCELLANEOUS) IMPLANT
DRAIN PENROSE 12X.25 LTX STRL (MISCELLANEOUS) IMPLANT
DRAPE HALF SHEET 40X57 (DRAPES) IMPLANT
DRSG EMULSION OIL 3X3 NADH (GAUZE/BANDAGES/DRESSINGS) IMPLANT
ELECT COATED BLADE 2.86 ST (ELECTRODE) ×2 IMPLANT
ELECT NDL TIP 2.8 STRL (NEEDLE) IMPLANT
ELECT NEEDLE TIP 2.8 STRL (NEEDLE) IMPLANT
ELECT REM PT RETURN 9FT ADLT (ELECTROSURGICAL) ×1
ELECTRODE REM PT RTRN 9FT ADLT (ELECTROSURGICAL) ×2 IMPLANT
EVACUATOR SILICONE 100CC (DRAIN) IMPLANT
FORCEPS BIPOLAR SPETZLER 8 1.0 (NEUROSURGERY SUPPLIES) ×2 IMPLANT
GAUZE 4X4 16PLY ~~LOC~~+RFID DBL (SPONGE) ×2 IMPLANT
GAUZE PACKING IODOFORM 1/4X15 (PACKING) IMPLANT
GAUZE SPONGE 4X4 12PLY STRL (GAUZE/BANDAGES/DRESSINGS) IMPLANT
GAUZE STRETCH 2X75IN STRL (MISCELLANEOUS) IMPLANT
GLOVE ECLIPSE 7.5 STRL STRAW (GLOVE) ×2 IMPLANT
GOWN STRL REUS W/ TWL LRG LVL3 (GOWN DISPOSABLE) ×4 IMPLANT
GOWN STRL REUS W/TWL LRG LVL3 (GOWN DISPOSABLE) ×2
KIT BASIN OR (CUSTOM PROCEDURE TRAY) ×2 IMPLANT
KIT TURNOVER KIT B (KITS) ×2 IMPLANT
NDL HYPO 30X.5 LL (NEEDLE) ×2 IMPLANT
NDL PRECISIONGLIDE 27X1.5 (NEEDLE) IMPLANT
NEEDLE HYPO 30X.5 LL (NEEDLE) IMPLANT
NEEDLE PRECISIONGLIDE 27X1.5 (NEEDLE) IMPLANT
NS IRRIG 1000ML POUR BTL (IV SOLUTION) ×2 IMPLANT
PAD ARMBOARD 7.5X6 YLW CONV (MISCELLANEOUS) ×4 IMPLANT
PENCIL FOOT CONTROL (ELECTRODE) ×2 IMPLANT
STAPLER VISISTAT 35W (STAPLE) IMPLANT
SUT CHROMIC 3 0 PS 2 (SUTURE) IMPLANT
SUT CHROMIC 4 0 P 3 18 (SUTURE) ×2 IMPLANT
SUT ETHILON 4 0 PS 2 18 (SUTURE) ×2 IMPLANT
SUT MNCRL AB 3-0 PS2 18 (SUTURE) IMPLANT
SUT NYLON ETHILON 5-0 P-3 1X18 (SUTURE) ×2 IMPLANT
SUT SILK 3 0 SH 30 (SUTURE) IMPLANT
SUT SILK 4 0 REEL (SUTURE) ×2 IMPLANT
SWAB COLLECTION DEVICE MRSA (MISCELLANEOUS) IMPLANT
SWAB CULTURE ESWAB REG 1ML (MISCELLANEOUS) IMPLANT
SYR BULB IRRIG 60ML STRL (SYRINGE) IMPLANT
SYR CONTROL 10ML LL (SYRINGE) ×2 IMPLANT
TOWEL GREEN STERILE FF (TOWEL DISPOSABLE) ×2 IMPLANT
TRAY ENT MC OR (CUSTOM PROCEDURE TRAY) ×2 IMPLANT
YANKAUER SUCT BULB TIP NO VENT (SUCTIONS) IMPLANT

## 2023-02-20 NOTE — Anesthesia Preprocedure Evaluation (Addendum)
Anesthesia Evaluation  Patient identified by MRN, date of birth, ID band Patient awake    Reviewed: Allergy & Precautions, NPO status , Patient's Chart, lab work & pertinent test results, reviewed documented beta blocker date and time   History of Anesthesia Complications Negative for: history of anesthetic complications  Airway Mallampati: Trach       Dental   Pulmonary COPD, former smoker  s/p laryngectomy    breath sounds clear to auscultation       Cardiovascular hypertension, Pt. on medications and Pt. on home beta blockers + CAD, + Past MI and + Peripheral Vascular Disease  Normal cardiovascular exam   '24 TTE - EF >75%.Trivial MR    Neuro/Psych negative neurological ROS  negative psych ROS   GI/Hepatic negative GI ROS, Neg liver ROS,,,  Endo/Other  diabetes, Type 1, Insulin Dependent   Na 131 Insulin pump   Renal/GU negative Renal ROS     Musculoskeletal negative musculoskeletal ROS (+)    Abdominal   Peds  Hematology  (+) Blood dyscrasia, anemia   Anesthesia Other Findings     Reproductive/Obstetrics                             Anesthesia Physical Anesthesia Plan  ASA: 3  Anesthesia Plan: General   Post-op Pain Management: Ofirmev IV (intra-op)*   Induction: Intravenous  PONV Risk Score and Plan: 2 and Treatment may vary due to age or medical condition and Ondansetron  Airway Management Planned: Tracheostomy  Additional Equipment: None  Intra-op Plan:   Post-operative Plan: Extubation in OR  Informed Consent: I have reviewed the patients History and Physical, chart, labs and discussed the procedure including the risks, benefits and alternatives for the proposed anesthesia with the patient or authorized representative who has indicated his/her understanding and acceptance.     Dental advisory given  Plan Discussed with: CRNA and  Anesthesiologist  Anesthesia Plan Comments:         Anesthesia Quick Evaluation

## 2023-02-20 NOTE — Op Note (Signed)
OPERATIVE REPORT  DATE OF SURGERY: 02/20/2023  PATIENT:  [name],  72 y.o. male  PRE-OPERATIVE DIAGNOSIS:  Post Op Neck wound  POST-OPERATIVE DIAGNOSIS:  Post Op Neck wound  PROCEDURE:  Procedure(s): INCISION AND DRAINAGE  NECK WOUND AND DRAIN PLACEMENT  SURGEON:  Susy Frizzle, MD  ASSISTANTS: None  ANESTHESIA:   General   EBL: 20 ml  DRAINS: 10 mm flat JP  LOCAL MEDICATIONS USED:  None  SPECIMEN:  none  COUNTS:  Correct  PROCEDURE DETAILS: The patient was taken to the operating room and placed on the operating table in the supine position. Following induction of general endotracheal anesthesia, via the tracheal stoma, the neck was prepped and draped in the standard fashion.  Several staples were removed from the left lateral portion of the incision.  The deeper layer was then opened with a hemostat.  Using a headlight and an [employer] retractor the wound was inspected.  There is no signs of infection.  The platysmal flaps were completely separated from the underlying tissue throughout.  There is no evidence of necrosis.  The drain was then exited through a separate stab incision below the incision and secured in place with a silk suture.  The incision was reapproximated using interrupted 3-0 chromic on the deep layer and a running 3-0 Monocryl on the skin.  Bacitracin was applied.  A dressing was applied.  Patient was awakened extubated and transferred to recovery in stable condition.    PATIENT DISPOSITION:  To PACU, stable

## 2023-02-20 NOTE — H&P (Signed)
[name] is an 72 y.o. male.   Chief Complaint: Neck swelling HPI: 2 weeks post laryngectomy, having swelling in his neck.  No signs of fistula.  Past Medical History:  Diagnosis Date   Cancer (HCC)    throat   Cigarette smoker    COPD (chronic obstructive pulmonary disease) (HCC)    Coronary artery disease    Diabetes mellitus    type 1  x 50 yrs   Diabetic retinopathy (HCC)    Dyslipidemia    Hypertension    Myocardial infarction [facility])    "a long time ago" from insulin pump   Peripheral vascular disease (HCC)     Past Surgical History:  Procedure Laterality Date   ABDOMINAL ANGIOGRAM N/A 05/29/2014   Procedure: ABDOMINAL ANGIOGRAM;  Surgeon: Pamella Pert, MD;  Location: [facility] CATH LAB;  Service: Cardiovascular;  Laterality: N/A;   ABDOMINAL AORTOGRAM W/LOWER EXTREMITY N/A 11/14/2019   Procedure: ABDOMINAL AORTOGRAM W/LOWER EXTREMITY;  Surgeon: Nada Libman, MD;  Location: [facility] INVASIVE CV LAB;  Service: Cardiovascular;  Laterality: N/A;   AMPUTATION Left 10/25/2015   Procedure: Left Foot 5th Ray Amputation;  Surgeon: Nadara Mustard, MD;  Location: [facility] OR;  Service: Orthopedics;  Laterality: Left;   AMPUTATION Left 12/06/2015   Procedure: AMPUTATION BELOW KNEE;  Surgeon: Nadara Mustard, MD;  Location: [facility] OR;  Service: Orthopedics;  Laterality: Left;   CARDIAC CATHETERIZATION     EF is 55-60% and no wall motion abnormalities (long long time ago)   DIRECT LARYNGOSCOPY N/A 12/10/2022   Procedure: DIRECT LARYNGOSCOPY WITH BIOPSY;  Surgeon: Serena Colonel, MD;  Location: [facility] OR;  Service: ENT;  Laterality: N/A;   FEMORAL-POPLITEAL BYPASS GRAFT Left 10/24/2015   Procedure: BYPASS GRAFT FEMORAL below knee POPLITEAL ARTERY with Left Saphenous Vein;  Surgeon: Nada Libman, MD;  Location: [facility] OR;  Service: Vascular;  Laterality: Left;   FEMORAL-POPLITEAL BYPASS GRAFT Right 11/15/2019   Procedure: BYPASS GRAFT FEMORAL-POPLITEAL ARTERY using Right Leg Greater Saphenous Vein;   Surgeon: Nada Libman, MD;  Location: [facility] OR;  Service: Vascular;  Laterality: Right;   FRACTURE SURGERY     left arm "many yrs ago"   LARYNGETOMY N/A 02/05/2023   Procedure: TOTAL LARYNGECTOMY;  Surgeon: Serena Colonel, MD;  Location: [facility] OR;  Service: ENT;  Laterality: N/A;   LOWER EXTREMITY ANGIOGRAM N/A 01/23/2014   Procedure: LOWER EXTREMITY ANGIOGRAM;  Surgeon: Pamella Pert, MD;  Location: [facility] CATH LAB;  Service: Cardiovascular;  Laterality: N/A;   LOWER EXTREMITY ANGIOGRAM N/A 07/31/2014   Procedure: LOWER EXTREMITY ANGIOGRAM;  Surgeon: Pamella Pert, MD;  Location: [facility] CATH LAB;  Service: Cardiovascular;  Laterality: N/A;   LOWER EXTREMITY ANGIOGRAM Left 10/11/2015   Procedure: Lower Extremity Angiogram;  Surgeon: Nada Libman, MD;  Location: [facility] INVASIVE CV LAB;  Service: Cardiovascular;  Laterality: Left;   PERIPHERAL VASCULAR BALLOON ANGIOPLASTY  11/14/2019   Procedure: PERIPHERAL VASCULAR BALLOON ANGIOPLASTY;  Surgeon: Nada Libman, MD;  Location: [facility] INVASIVE CV LAB;  Service: Cardiovascular;;   PERIPHERAL VASCULAR CATHETERIZATION Left 10/11/2015   Procedure: Peripheral Vascular Balloon Angioplasty;  Surgeon: Nada Libman, MD;  Location: [facility] INVASIVE CV LAB;  Service: Cardiovascular;  Laterality: Left;  sfa failed unable to cross occluded sfa   PERIPHERAL VASCULAR CATHETERIZATION N/A 10/11/2015   Procedure: Abdominal Aortogram;  Surgeon: Nada Libman, MD;  Location: [facility] INVASIVE CV LAB;  Service: Cardiovascular;  Laterality: N/A;   RADICAL NECK DISSECTION Bilateral 02/05/2023  Procedure: NECK DISSECTION;  Surgeon: Serena Colonel, MD;  Location: [facility] OR;  Service: ENT;  Laterality: Bilateral;   STUMP REVISION Left 03/02/2016   Procedure: Revision Left Below Knee Amputation;  Surgeon: Nadara Mustard, MD;  Location: [facility] OR;  Service: Orthopedics;  Laterality: Left;   TOTAL LARYNGECTOMY N/A 02/05/2023   Per surgical report on 02/05/2023   TRACHEOSTOMY TUBE PLACEMENT N/A  12/10/2022   Procedure: TRACHEOSTOMY UNDER LOCAL;  Surgeon: Serena Colonel, MD;  Location: [facility] OR;  Service: ENT;  Laterality: N/A;    Family History  Problem Relation Age of Onset   Coronary artery disease Mother    Other Father        Declined after a fall   Social History:  reports that he has quit smoking. His smoking use included cigarettes. He has a 60.00 pack-year smoking history. He has never used smokeless tobacco. He reports current alcohol use. He reports that he does not use drugs.  Allergies:  Allergies  Allergen Reactions   Simvastatin Other (See Comments)    MYALGIAS, MUSCLE WEAKNESS     Medications Prior to Admission  Medication Sig Dispense Refill   ibuprofen (ADVIL) 200 MG tablet Take 200-400 mg by mouth every 8 (eight) hours as needed (pain.).     acetaminophen (TYLENOL) 325 MG tablet Take 2 tablets (650 mg total) by mouth every 6 (six) hours as needed for mild pain (or Fever >/= 101).     albuterol (VENTOLIN HFA) 108 (90 Base) MCG/ACT inhaler Inhale 1-2 puffs into the lungs every 6 (six) hours as needed. 8 g 2   aspirin EC 81 MG tablet Take 243 mg by mouth at bedtime.     baclofen (LIORESAL) 20 MG tablet Take 20 mg by mouth daily as needed (for leg cramps). Reported on 12/05/2015  0   calcium carbonate (TUMS - DOSED IN MG ELEMENTAL CALCIUM) 500 MG chewable tablet Chew 1,000-2,000 mg by mouth as needed for indigestion or heartburn.     clindamycin (CLEOCIN) 300 MG capsule Take 1 capsule (300 mg total) by mouth 3 (three) times daily. 15 capsule 0   folic acid (FOLVITE) 800 MCG tablet Take 1,600 mcg by mouth at bedtime.     HUMALOG 100 UNIT/ML injection 2.1 mLs See admin instructions. Via PUMP Inject 2.1 ml into the pump when it runs out. Pt is unsure how often he fills his pump or the rate of his doses.     ipratropium-albuterol (DUONEB) 0.5-2.5 (3) MG/3ML SOLN Take 3 mLs by nebulization 2 (two) times daily. (Patient taking differently: Take 3 mLs by nebulization 2 (two)  times daily as needed (wheezing/shortness of breath.).) 360 mL 1   metoprolol succinate (TOPROL-XL) 25 MG 24 hr tablet Take 1 tablet (25 mg total) by mouth daily. (Patient taking differently: Take 25 mg by mouth at bedtime.) 30 tablet 1   mometasone-formoterol (DULERA) 200-5 MCG/ACT AERO Inhale 2 puffs into the lungs 2 (two) times daily. (Patient taking differently: Inhale 2 puffs into the lungs 2 (two) times daily as needed (respiratory issues.).) 13 g 1   Multiple Vitamins-Minerals (MULTIVITAMIN GUMMIES ADULT PO) Take 3 tablets by mouth at bedtime.     ramipril (ALTACE) 5 MG capsule Take 5 mg by mouth at bedtime.     rosuvastatin (CRESTOR) 10 MG tablet TAKE 1 TABLET BY MOUTH  DAILY (Patient taking differently: Take 10 mg by mouth at bedtime.) 90 tablet 3   sertraline (ZOLOFT) 50 MG tablet Take 50 mg by mouth at bedtime.  No results found for this or any previous visit (from the past 48 hour(s)). No results found.  ROS: otherwise negative  Blood pressure (!) 125/57, pulse 61, temperature 99 F (37.2 C), temperature source Oral, resp. rate 18, height 5' 10.98" (1.803 m), weight 56.5 kg, SpO2 94 %.  PHYSICAL EXAM: Overall appearance:  Healthy appearing, in no distress Head:  Normocephalic, atraumatic. Ears: External ears look healthy. Nose: External nose is healthy in appearance. Internal nasal exam free of any lesions or obstruction. Oral Cavity/pharynx:  There are no mucosal lesions or masses identified. Neuro:  No identifiable neurologic deficits. Neck: No palpable neck masses.  Stoma is healthy and clear.  Swelling of the upper neck area.  Studies Reviewed: none    Assessment/Plan Plan neck exploration and suction drain placement.  Serena Colonel 02/20/2023, 1:01 PM

## 2023-02-20 NOTE — Transfer of Care (Signed)
Immediate Anesthesia Transfer of Care Note  Patient: [name]  Procedure(s) Performed: INCISION AND DRAINAGE  NECK ABSCESS AND DRAIN PLACEMENT (Neck)  Patient Location: PACU  Anesthesia Type:General  Level of Consciousness: awake, alert , and oriented  Airway & Oxygen Therapy: Patient Spontanous Breathing  Post-op Assessment: Report given to RN and Post -op Vital signs reviewed and stable  Post vital signs: Reviewed and stable  Last Vitals:  Vitals Value Taken Time  BP 118/54 02/20/23 1430  Temp 37 C 02/20/23 1423  Pulse 64 02/20/23 1433  Resp 24 02/20/23 1433  SpO2 90 % 02/20/23 1433  Vitals shown include unvalidated device data.  Last Pain:  Vitals:   02/20/23 1258  TempSrc: Oral         Complications: No notable events documented.

## 2023-02-20 NOTE — Anesthesia Procedure Notes (Signed)
Procedure Name: Intubation Date/Time: 02/20/2023 1:34 AM  Performed by: Gwenyth Allegra, CRNAPre-anesthesia Checklist: Patient identified, Suction available, Emergency Drugs available, Patient being monitored and Timeout performed Patient Re-evaluated:Patient Re-evaluated prior to induction Oxygen Delivery Method: Circle system utilized Induction Type: IV induction and Tracheostomy Tube size: 7.0 mm Number of attempts: 1 Placement Confirmation: positive ETCO2 and breath sounds checked- equal and bilateral Tube secured with: Tape Dental Injury: Teeth and Oropharynx as per pre-operative assessment

## 2023-02-20 NOTE — Anesthesia Postprocedure Evaluation (Signed)
Anesthesia Post Note  Patient: [name]  Procedure(s) Performed: INCISION AND DRAINAGE  NECK ABSCESS AND DRAIN PLACEMENT (Neck)     Patient location during evaluation: PACU Anesthesia Type: General Level of consciousness: awake and alert Pain management: pain level controlled Vital Signs Assessment: post-procedure vital signs reviewed and stable Respiratory status: spontaneous breathing, nonlabored ventilation and respiratory function stable Cardiovascular status: stable and blood pressure returned to baseline Anesthetic complications: no   No notable events documented.  Last Vitals:  Vitals:   02/20/23 1500 02/20/23 1515  BP: (!) 111/49 (!) 125/53  Pulse: (!) 56 (!) 56  Resp: (!) 21 20  Temp:  37 C  SpO2: 93% 93%    Last Pain:  Vitals:   02/20/23 1515  TempSrc:   PainSc: 0-No pain                 Beryle Lathe

## 2023-02-20 NOTE — Discharge Instructions (Signed)
Keep the drain charged.  Keep the dressing on.

## 2023-02-21 ENCOUNTER — Encounter (HOSPITAL_COMMUNITY): Payer: Self-pay | Admitting: Otolaryngology

## 2023-03-01 ENCOUNTER — Ambulatory Visit: Payer: Medicare Other

## 2023-03-02 ENCOUNTER — Other Ambulatory Visit: Payer: Self-pay

## 2023-03-02 ENCOUNTER — Ambulatory Visit: Payer: Medicare Other | Attending: Family Medicine

## 2023-03-02 DIAGNOSIS — R498 Other voice and resonance disorders: Secondary | ICD-10-CM | POA: Insufficient documentation

## 2023-03-02 DIAGNOSIS — Z9002 Acquired absence of larynx: Secondary | ICD-10-CM | POA: Insufficient documentation

## 2023-03-02 NOTE — Therapy (Signed)
OUTPATIENT SPEECH LANGUAGE PATHOLOGY ONCOLOGY EVALUATION   Patient Name: [name] MRN: [id number] DOB:1950/10/11, 72 y.o., male Today's Date: 03/02/2023  PCP: [name], MD     REFERRING PROVIDER: Tyrone Nine, MD  END OF SESSION:  End of Session - 03/02/23 2042     Visit Number 1    Number of Visits 13    Date for SLP Re-Evaluation 04/30/23    SLP Start Time 1019    SLP Stop Time  1100    SLP Time Calculation (min) 41 min    Activity Tolerance Patient tolerated treatment well             Past Medical History:  Diagnosis Date   Cancer (HCC)    throat   Cigarette smoker    COPD (chronic obstructive pulmonary disease) (HCC)    Coronary artery disease    Diabetes mellitus    type 1  x 50 yrs   Diabetic retinopathy (HCC)    Dyslipidemia    Hypertension    Myocardial infarction (HCC)    "a long time ago" from insulin pump   Peripheral vascular disease (HCC)    Past Surgical History:  Procedure Laterality Date   ABDOMINAL ANGIOGRAM N/A 05/29/2014   Procedure: ABDOMINAL ANGIOGRAM;  Surgeon: Pamella Pert, MD;  Location: [facility] CATH LAB;  Service: Cardiovascular;  Laterality: N/A;   ABDOMINAL AORTOGRAM W/LOWER EXTREMITY N/A 11/14/2019   Procedure: ABDOMINAL AORTOGRAM W/LOWER EXTREMITY;  Surgeon: Nada Libman, MD;  Location: [facility] INVASIVE CV LAB;  Service: Cardiovascular;  Laterality: N/A;   AMPUTATION Left 10/25/2015   Procedure: Left Foot 5th Ray Amputation;  Surgeon: Nadara Mustard, MD;  Location: [facility] OR;  Service: Orthopedics;  Laterality: Left;   AMPUTATION Left 12/06/2015   Procedure: AMPUTATION BELOW KNEE;  Surgeon: Nadara Mustard, MD;  Location: [facility] OR;  Service: Orthopedics;  Laterality: Left;   CARDIAC CATHETERIZATION     EF is 55-60% and no wall motion abnormalities (long long time ago)   DIRECT LARYNGOSCOPY N/A 12/10/2022   Procedure: DIRECT LARYNGOSCOPY WITH BIOPSY;  Surgeon: Serena Colonel, MD;  Location: [facility] OR;  Service: ENT;  Laterality: N/A;    FEMORAL-POPLITEAL BYPASS GRAFT Left 10/24/2015   Procedure: BYPASS GRAFT FEMORAL below knee POPLITEAL ARTERY with Left Saphenous Vein;  Surgeon: Nada Libman, MD;  Location: [facility] OR;  Service: Vascular;  Laterality: Left;   FEMORAL-POPLITEAL BYPASS GRAFT Right 11/15/2019   Procedure: BYPASS GRAFT FEMORAL-POPLITEAL ARTERY using Right Leg Greater Saphenous Vein;  Surgeon: Nada Libman, MD;  Location: [facility] OR;  Service: Vascular;  Laterality: Right;   FRACTURE SURGERY     left arm "many yrs ago"   INCISION AND DRAINAGE ABSCESS N/A 02/20/2023   Procedure: INCISION AND DRAINAGE  NECK ABSCESS AND DRAIN PLACEMENT;  Surgeon: Serena Colonel, MD;  Location: [facility] OR;  Service: ENT;  Laterality: N/ALillia Corporal N/A 02/05/2023   Procedure: TOTAL LARYNGECTOMY;  Surgeon: Serena Colonel, MD;  Location: [facility] OR;  Service: ENT;  Laterality: N/A;   LOWER EXTREMITY ANGIOGRAM N/A 01/23/2014   Procedure: LOWER EXTREMITY ANGIOGRAM;  Surgeon: Pamella Pert, MD;  Location: [facility] CATH LAB;  Service: Cardiovascular;  Laterality: N/A;   LOWER EXTREMITY ANGIOGRAM N/A 07/31/2014   Procedure: LOWER EXTREMITY ANGIOGRAM;  Surgeon: Pamella Pert, MD;  Location: [facility] CATH LAB;  Service: Cardiovascular;  Laterality: N/A;   LOWER EXTREMITY ANGIOGRAM Left 10/11/2015   Procedure: Lower Extremity Angiogram;  Surgeon: Nada Libman, MD;  Location:  [facility] INVASIVE CV LAB;  Service: Cardiovascular;  Laterality: Left;   PERIPHERAL VASCULAR BALLOON ANGIOPLASTY  11/14/2019   Procedure: PERIPHERAL VASCULAR BALLOON ANGIOPLASTY;  Surgeon: Nada Libman, MD;  Location: [facility] INVASIVE CV LAB;  Service: Cardiovascular;;   PERIPHERAL VASCULAR CATHETERIZATION Left 10/11/2015   Procedure: Peripheral Vascular Balloon Angioplasty;  Surgeon: Nada Libman, MD;  Location: [facility] INVASIVE CV LAB;  Service: Cardiovascular;  Laterality: Left;  sfa failed unable to cross occluded sfa   PERIPHERAL VASCULAR CATHETERIZATION N/A 10/11/2015   Procedure: Abdominal  Aortogram;  Surgeon: Nada Libman, MD;  Location: [facility] INVASIVE CV LAB;  Service: Cardiovascular;  Laterality: N/A;   RADICAL NECK DISSECTION Bilateral 02/05/2023   Procedure: NECK DISSECTION;  Surgeon: Serena Colonel, MD;  Location: [facility] OR;  Service: ENT;  Laterality: Bilateral;   STUMP REVISION Left 03/02/2016   Procedure: Revision Left Below Knee Amputation;  Surgeon: Nadara Mustard, MD;  Location: [facility] OR;  Service: Orthopedics;  Laterality: Left;   TOTAL LARYNGECTOMY N/A 02/05/2023   Per surgical report on 02/05/2023   TRACHEOSTOMY TUBE PLACEMENT N/A 12/10/2022   Procedure: TRACHEOSTOMY UNDER LOCAL;  Surgeon: Serena Colonel, MD;  Location: [facility] OR;  Service: ENT;  Laterality: N/A;   Patient Active Problem List   Diagnosis Date Noted   Laryngeal mass 02/05/2023   S/P laryngectomy 02/05/2023   COPD (chronic obstructive pulmonary disease) (HCC) 01/11/2023   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   Protein-calorie malnutrition, severe (HCC) 12/15/2022   Postprocedural pneumothorax 12/11/2022   Acute respiratory failure with hypoxia (HCC) 12/11/2022   Tracheostomy status (HCC) 12/11/2022   Pressure injury of skin 12/11/2022   Hyponatremia 12/10/2022   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 12/09/2022   Adrenal adenoma, left 12/09/2022   Essential hypertension 12/09/2022   Dyspnea 11/27/2022   Elevated troponin 11/27/2022   Cough 10/21/2017   Wheezing 09/21/2017   Below-knee amputation of left lower extremity (HCC) 12/06/2015   Diabetic retinopathy associated with type 1 diabetes mellitus (HCC) 12/27/2014   Pleurodynia 11/23/2014   Type 1 diabetes mellitus on insulin therapy (HCC) 07/30/2014   Claudication in peripheral vascular disease (HCC) 07/30/2014   Male erectile disorder 08/10/2013   PVD (peripheral vascular disease) s/p R fem-pop bypass and L BKA 04/15/2011   Hyperlipidemia 04/15/2011   Polyneuropathy 10/11/2009   Carotid artery occlusion 06/27/2009   Tobacco user 06/18/2009    ONSET DATE:  April 2024   REFERRING DIAG: Z90.02 (ICD-10-CM) - S/P laryngectomy  THERAPY DIAG:  Other voice and resonance disorders  Rationale for Evaluation and Treatment: Rehabilitation  SUBJECTIVE:   SUBJECTIVE STATEMENT: Pt arrived today without HME placed, with electrolarynx.  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 Pollyann Kennedy).   PAIN:  Are you having pain? No  FALLS: Has patient fallen in last 6 months?  No  LIVING ENVIRONMENT: Lives with: lives with an adult companion Lives in: House/apartment  PLOF:  Level of assistance: Independent with ADLs, Independent with IADLs Employment: Retired  PATIENT GOALS: "Well I paid for it" (pt's response re: electrolarynx (EL) when asked if he would like to use EL or whiteboard or to communicate)  OBJECTIVE:  DIAGNOSTIC FINDINGS: CT SOFT TISSUE NECK W CONTRAST  IMPRESSION: 1. Thickening of the bilateral vocal cords, right-greater-than-left, with medialization of the right  vocal fold and an exophytic nodule arising from the right vocal fold measuring 5 x 9 x 7 mm. 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 2. No evidence of cervical lymphadenopathy. 3. Emphysema.   RECOMMENDATIONS FROM OBJECTIVE SWALLOW STUDY (MBSS/FEES):  12/15/22 Clinical Impression: Clinical Impression: Pt's MBS completed without a PMV due to trach not yet changed to uncuffed prior to [facility] evaluation per ENT orders. He demonstrated mild oropharyngeal dysphagia marked by decreased oral transit with pt compensating by  thrusting/extending head to assist in oral transit and occasional minimal lingual residue. Pt noted to have reduced laryngeal elevation and epiglottic deflection resulting in penetration with  thin barium before full closure could occur. Most penetration instances with thin were initially ejected from vestibule during the swallow however barium on upper 1/3 of epiglottis began to penetrate midway into vestiuble without awareness after the swallow. Pt then penetrated to vocal cords with thin. Gloved finger occlusion with cough helped mobilize penetrate to upper epiglottis. Chin tuck resulted in silent aspiration and right head turn was ineffective. Nectar thick penetrated to the cords and finger occlusion with cough moved barium higher in vestibule. Mastication and transit of solid was functional. Recommend pt continue regular texture, thin liquid with gloved finger occlusion and strong cough after every 2-3 sips liquid, pills whole in puree. Pt is being worked up for laryngeal mass and treatment plan is unknown to this SLP. Recommend follow up with home health ST for continuation of strategies and assist during treatment process.   S[facility]): SLP followed up s/p total laryngectomy 02/05/23 for pt education and training. Laryngectomy Pulmonary Kit at the bedside, and standard lary tube placed by ENT this morning per chart review and nursing report. Pt initially had humidified O2 over stomal lary site. Coordinated with RT and nursing, SLP placed HME to lary tube, (humidified O2 removed by RT) pt tolerating well on room air. Education initiated with pt and daughter regarding anatomical changes post total laryngectomy, need for HME use 24 hours a day as tolerated to reduce risk for mucous plugging and optimize pulmonary function ("new nose"). SLP educated donning and doffing of HME and precautions to place new HME 24 hours a day (single use); care tips at bedside. Educated on attachment options for HMEs; peristomal and intraluminal with focus on intraluminal while pt heals post op. Pt attempted HME placement with SLP instruction, will need further practice. Reinforced education on neck  breather only status, lost connection of upper respiratory system to lungs, and changes to communication. Alaryngeal communication options were educated upon. SLP set up electrolarynx at bedside with intraoral adapter. Educated regarding volume, pitch, chunking, and articulation for alaryngeal communication intelligible. Furhter SLP education and support warranted. SLP to closely follow.    COGNITION: Overall cognitive status: Within functional limits for tasks assessed  LANGUAGE: Receptive and Expressive language appeared WNL.  ORAL MOTOR EXAMINATION: Overall status: WFL  MOTOR SPEECH: Overall motor speech: impaired: aphonic. Pt used white board to communicate today with SLP with rare mouthing of words Level of impairment: Word Respiration:  via stoma Phonation: aphonic secondary to laryngectomy Articulation: not tested - pt prefers to use electrolarynx with neck placement Motor planning: Appears intact  CLINICAL SWALLOW ASSESSMENT:   Current diet: regular Dentition: adequate natural dentition Patient directly observed with POs: No   PATIENT REPORTED OUTCOME MEASURES (PROM): Communication Participation Item Bank: when ST is initiated  TODAY'S TREATMENT:  DATE:  03/02/23: Discussion about whether pt wanted to initiate therapy with oral adapter or initiate therapy with neck placement and pt preferred neck placement. Pt to inquire when pt can initiate neck placement at ENT appointment Friday 03/05/23. Education today re: HME rationale and need, as pt entered without HME placed. When asked about this pt wrote, "Too much phlegm. I cough so much." SLP reiterated pt will use 1-2/day first 4-6 weeks post op. SLP furhter educated pt re: tracheoesophageal voice prosthesis (TEP) and pt stated Dr. Pollyann Kennedy had explained this to him and recommended it, surgery could take  place (pt reported) 2-3 months post laryngectomy. Pt appeared interested about this option. SLP shared pt would have to follow up at [facility] as SLPs are not trained re: TEP insertion/removal.  PATIENT EDUCATION: Education details:  see above in "today's treatment" Person educated: Patient Education method: Explanation and Demonstration Education comprehension: verbalized understanding and needs further education   ASSESSMENT:  CLINICAL IMPRESSION: Patient is a 72 y.o. M who was seen today for assessment post laryngectomy. SLP worked with pt with extensive education as above, and pt decided to initiate therapy when he could use neck placement instead of intraoral tube. Pt appeared less-interested   OBJECTIVE IMPAIRMENTS: include alaryngeal speech. These impairments are limiting patient from household responsibilities, ADLs/IADLs, and effectively communicating at home and in community. Factors affecting potential to achieve goals and functional outcome are cooperation/participation level. Patient will benefit from skilled SLP services to address above impairments and improve overall function.  REHAB POTENTIAL: Good   GOALS: Goals reviewed with patient? No  SHORT TERM GOALS: Target date: TBD  Pt will demo optimal EL placement in 18/20 attempts, x3/session x3 sessions Baseline: Goal status: INITIAL  2.  Pt will communicate single words with 100% intelligibility in 3 sessions Baseline:  Goal status: INITIAL  3.  Pt will verbalize sentences with 95% intelligibility in 3 sessions Baseline:  Goal status: INITIAL  4.  Pt will modify placement when unintelligible in 80% of opportunities in 3 sessions Baseline:  Goal status: INITIAL   LONG TERM GOALS: Target date: TBD  Pt will demo correct placement of EL in conversation resulting in 95%+ intelligibility in 3 sessions Baseline:  Goal status: INITIAL  2.  Pt will demo functional articulation in 10 minutes simple/mod  complex conversation resulting in 95%+ intelligibility in 3 sessions Baseline:  Goal status: INITIAL   PLAN:  SLP FREQUENCY: 1-2x/week  SLP DURATION: 8 weeks  PLANNED INTERVENTIONS: Environmental controls, Cueing hierachy, Internal/external aids, Functional tasks, Multimodal communication approach, SLP instruction and feedback, Compensatory strategies, and Patient/family education    [facility], CCC-SLP 03/02/2023, 8:47 PM

## 2023-03-11 ENCOUNTER — Encounter (HOSPITAL_COMMUNITY): Payer: Self-pay | Admitting: Emergency Medicine

## 2023-03-11 ENCOUNTER — Emergency Department (HOSPITAL_COMMUNITY)
Admission: EM | Admit: 2023-03-11 | Discharge: 2023-03-12 | Disposition: A | Discharge status: Home / Self Care | Payer: Medicare Other | Attending: Emergency Medicine | Admitting: Emergency Medicine

## 2023-03-11 ENCOUNTER — Other Ambulatory Visit: Payer: Self-pay

## 2023-03-11 DIAGNOSIS — Z85818 Personal history of malignant neoplasm of other sites of lip, oral cavity, and pharynx: Secondary | ICD-10-CM | POA: Insufficient documentation

## 2023-03-11 DIAGNOSIS — E109 Type 1 diabetes mellitus without complications: Secondary | ICD-10-CM | POA: Diagnosis not present

## 2023-03-11 DIAGNOSIS — F17210 Nicotine dependence, cigarettes, uncomplicated: Secondary | ICD-10-CM | POA: Diagnosis not present

## 2023-03-11 DIAGNOSIS — Z79899 Other long term (current) drug therapy: Secondary | ICD-10-CM | POA: Diagnosis not present

## 2023-03-11 DIAGNOSIS — Z7982 Long term (current) use of aspirin: Secondary | ICD-10-CM | POA: Diagnosis not present

## 2023-03-11 DIAGNOSIS — I10 Essential (primary) hypertension: Secondary | ICD-10-CM | POA: Insufficient documentation

## 2023-03-11 DIAGNOSIS — J449 Chronic obstructive pulmonary disease, unspecified: Secondary | ICD-10-CM | POA: Diagnosis not present

## 2023-03-11 DIAGNOSIS — Z794 Long term (current) use of insulin: Secondary | ICD-10-CM | POA: Diagnosis not present

## 2023-03-11 DIAGNOSIS — R112 Nausea with vomiting, unspecified: Secondary | ICD-10-CM | POA: Diagnosis present

## 2023-03-11 MED ORDER — ONDANSETRON HCL 4 MG/2ML IJ SOLN
4.0000 mg | Freq: Once | INTRAMUSCULAR | Status: AC
  Administered 2023-03-12: 4 mg via INTRAVENOUS
  Filled 2023-03-11: qty 2

## 2023-03-11 MED ORDER — SODIUM CHLORIDE 0.9 % IV BOLUS
1000.0000 mL | Freq: Once | INTRAVENOUS | Status: AC
  Administered 2023-03-12: 1000 mL via INTRAVENOUS

## 2023-03-11 NOTE — ED Notes (Signed)
Pt actively vomiting, yellow emesis.

## 2023-03-11 NOTE — ED Provider Notes (Signed)
[place] EMERGENCY DEPARTMENT AT [facility] Provider Note  CSN: [id number] Arrival date & time: 03/11/23 2132  Chief Complaint(s) Emesis and Hypertension  HPI [name] is a 72 y.o. 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  He denies any suspicious food intake.  No abdominal pain.  No fevers.  No diarrhea.  Still having bowel movements.  Patient brought in by EMS who provided the patient with antiemetics.  Patient currently states he feels better.  The history is provided by the patient.    Past Medical History Past Medical History:  Diagnosis Date   Cancer (HCC)    throat   Cigarette smoker    COPD (chronic obstructive pulmonary disease) (HCC)    Coronary artery disease    Diabetes mellitus    type 1  x 50 yrs   Diabetic retinopathy (HCC)    Dyslipidemia    Hypertension    Myocardial infarction [facility])    "a long time ago" from insulin pump   Peripheral vascular disease (HCC)    Patient Active Problem List   Diagnosis Date Noted   Laryngeal mass 02/05/2023   S/P laryngectomy 02/05/2023   COPD (chronic obstructive pulmonary disease) (HCC) 01/11/2023   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   Protein-calorie malnutrition, severe (HCC) 12/15/2022   Postprocedural pneumothorax 12/11/2022   Acute respiratory failure with hypoxia (HCC) 12/11/2022   Tracheostomy status (HCC) 12/11/2022   Pressure injury of skin 12/11/2022   Hyponatremia 12/10/2022   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 12/09/2022   Adrenal adenoma, left 12/09/2022   Essential hypertension 12/09/2022   Dyspnea 11/27/2022   Elevated troponin 11/27/2022   Cough 10/21/2017   Wheezing 09/21/2017    Below-knee amputation of left lower extremity (HCC) 12/06/2015   Diabetic retinopathy associated with type 1 diabetes mellitus (HCC) 12/27/2014   Pleurodynia 11/23/2014   Type 1 diabetes mellitus on insulin therapy (HCC) 07/30/2014   Claudication in peripheral vascular disease (HCC) 07/30/2014   Male erectile disorder 08/10/2013   PVD (peripheral vascular disease) s/p R fem-pop bypass and L BKA 04/15/2011   Hyperlipidemia 04/15/2011   Polyneuropathy 10/11/2009   Carotid artery occlusion 06/27/2009   Tobacco user 06/18/2009   Home Medication(s) Prior to Admission medications   Medication Sig Start Date End Date Taking? Authorizing Provider  ondansetron (ZOFRAN-ODT) 4 MG disintegrating tablet Take 1 tablet (4 mg total) by mouth every 8 (eight) hours as needed for up to 3 days for nausea or vomiting. 03/12/23 03/15/23 Yes Henrick Mcgue, Amadeo Garnet, MD  oxyCODONE (ROXICODONE) 5 MG immediate release tablet Take 0.5-1 tablets (2.5-5 mg total) by mouth every 6 (six) hours as needed for up to 5 days for severe pain. 03/12/23 03/17/23 Yes Osie Amparo, Amadeo Garnet, MD  acetaminophen (TYLENOL) 325 MG tablet Take 2 tablets (650 mg total) by mouth every 6 (six) hours as needed for mild pain (or Fever >/= 101). 12/17/22   Rodolph Bong, MD  albuterol (VENTOLIN HFA) 108 (90 Base) MCG/ACT inhaler Inhale 1-2 puffs into the lungs every 6 (six) hours as needed. 01/11/23   Parrett, Virgel Bouquet, NP  aspirin EC 81 MG tablet Take 243 mg by mouth at bedtime.    [provider]  baclofen (LIORESAL) 20 MG tablet Take 20 mg  by mouth daily as needed (for leg cramps). Reported on 12/05/2015 09/09/15   [provider]  calcium carbonate (TUMS - DOSED IN MG ELEMENTAL CALCIUM) 500 MG chewable tablet Chew 1,000-2,000 mg by mouth as needed for indigestion or heartburn.    [provider]  clindamycin (CLEOCIN) 300 MG capsule Take 1 capsule (300 mg total) by mouth 3 (three) times daily. 02/17/23   Serena Colonel,  MD  folic acid (FOLVITE) 800 MCG tablet Take 1,600 mcg by mouth at bedtime.    [provider]  HUMALOG 100 UNIT/ML injection 2.1 mLs See admin instructions. Via PUMP Inject 2.1 ml into the pump when it runs out. Pt is unsure how often he fills his pump or the rate of his doses. 01/09/19   [provider]  ibuprofen (ADVIL) 200 MG tablet Take 200-400 mg by mouth every 8 (eight) hours as needed (pain.).    [provider]  ipratropium-albuterol (DUONEB) 0.5-2.5 (3) MG/3ML SOLN Take 3 mLs by nebulization 2 (two) times daily. Patient taking differently: Take 3 mLs by nebulization 2 (two) times daily as needed (wheezing/shortness of breath.). 12/17/22   Rodolph Bong, MD  metoprolol succinate (TOPROL-XL) 25 MG 24 hr tablet Take 1 tablet (25 mg total) by mouth daily. Patient taking differently: Take 25 mg by mouth at bedtime. 02/14/13   Nahser, Deloris Ping, MD  mometasone-formoterol (DULERA) 200-5 MCG/ACT AERO Inhale 2 puffs into the lungs 2 (two) times daily. Patient taking differently: Inhale 2 puffs into the lungs 2 (two) times daily as needed (respiratory issues.). 12/17/22   Rodolph Bong, MD  Multiple Vitamins-Minerals (MULTIVITAMIN GUMMIES ADULT PO) Take 3 tablets by mouth at bedtime.    [provider]  ramipril (ALTACE) 5 MG capsule Take 5 mg by mouth at bedtime.    [provider]  rosuvastatin (CRESTOR) 10 MG tablet TAKE 1 TABLET BY MOUTH  DAILY Patient taking differently: Take 10 mg by mouth at bedtime. 06/24/20   Cantwell, Celeste C, PA-C  sertraline (ZOLOFT) 50 MG tablet Take 50 mg by mouth at bedtime.    [provider]                                                                                                                                    Allergies Simvastatin  Review of Systems Review of Systems As noted in HPI  Physical Exam Vital Signs  I have reviewed the triage vital signs BP (!) 154/78   Pulse (!) 58   Temp  98.2 F (36.8 C) (Oral)   Resp 16   SpO2 99%   Physical Exam Vitals reviewed.  Constitutional:      General: He is not in acute distress.    Appearance: He is well-developed. He is not diaphoretic.  HENT:     Head: Normocephalic and atraumatic.     Right Ear: External ear normal.     Left  Ear: External ear normal.     Nose: Nose normal.     Mouth/Throat:     Mouth: Mucous membranes are moist.  Eyes:     General: No scleral icterus.    Conjunctiva/sclera: Conjunctivae normal.  Neck:     Trachea: Phonation normal.   Cardiovascular:     Rate and Rhythm: Normal rate and regular rhythm.  Pulmonary:     Effort: Pulmonary effort is normal. No respiratory distress.     Breath sounds: No stridor.  Abdominal:     General: There is no distension.     Tenderness: There is no abdominal tenderness.  Musculoskeletal:        General: Normal range of motion.     Cervical back: Normal range of motion.  Neurological:     Mental Status: He is alert and oriented to person, place, and time.  Psychiatric:        Behavior: Behavior normal.     ED Results and Treatments Labs (all labs ordered are listed, but only abnormal results are displayed) Labs Reviewed  CBC WITH DIFFERENTIAL/PLATELET - Abnormal; Notable for the following components:      Result Value   RBC 3.56 (*)    Hemoglobin 10.9 (*)    HCT 33.5 (*)    Platelets 411 (*)    Abs Immature Granulocytes 0.10 (*)    All other components within normal limits  COMPREHENSIVE METABOLIC PANEL - Abnormal; Notable for the following components:   Sodium 131 (*)    Glucose, Bld 101 (*)    Calcium 8.4 (*)    Albumin 3.4 (*)    All other components within normal limits  LIPASE, BLOOD                                                                                                                         EKG  EKG Interpretation Date/Time:    Ventricular Rate:    PR Interval:    QRS Duration:    QT Interval:    QTC Calculation:   R  Axis:      Text Interpretation:         Radiology No results found.  Medications Ordered in ED Medications  ondansetron (ZOFRAN) injection 4 mg (4 mg Intravenous Given 03/12/23 0000)  sodium chloride 0.9 % bolus 1,000 mL (0 mLs Intravenous Stopped 03/12/23 0202)  acetaminophen (TYLENOL) tablet 650 mg (650 mg Oral Given 03/12/23 0537)  oxyCODONE (Oxy IR/ROXICODONE) immediate release tablet 5 mg (5 mg Oral Given 03/12/23 0537)  ondansetron (ZOFRAN-ODT) disintegrating tablet 4 mg (4 mg Oral Given 03/12/23 0311)  fentaNYL (SUBLIMAZE) injection 25 mcg (25 mcg Intravenous Given 03/12/23 0865)   Procedures Procedures  (including critical care time) Medical Decision Making / ED Course   Medical Decision Making Amount and/or Complexity of Data Reviewed Labs: ordered. Decision-making details documented in ED Course.  Risk OTC drugs. Prescription drug management.    Patient presents with nausea and vomiting after starting tramadol.  No abdominal pain.  No other physical complaints.  Abdomen benign. Likely related to medication side effects. Given comorbidities, will obtain screening labs to rule out any electrolyte/metabolic derangements. Patient's continuous glucometer reads 100s.  Given the benign abdomen, I have low suspicion for serious intra-abdominal inflammatory/infectious process or bowel obstruction at this time.  Will defer any imaging.  May reconsider if clinical picture changes.  CBC without leukocytosis.  Stable hemoglobin. Metabolic panel without significant electrolyte derangements or renal sufficiency.  No evidence of bili obstruction or pancreatitis.  Patient required additional antiemetic.  Provided with IV fluids as well.  Patient reported back pain related to his fracture.  Given Tylenol and oral Roxicodone. Tolerated PO    Final Clinical Impression(s) / ED Diagnoses Final diagnoses:  Nausea and vomiting in adult   The patient appears reasonably screened  and/or stabilized for discharge and I doubt any other medical condition or other [facility] requiring further screening, evaluation, or treatment in the ED at this time. I have discussed the findings, Dx and Tx plan with the patient/family who expressed understanding and agree(s) with the plan. Discharge instructions discussed at length. The patient/family was given strict return precautions who verbalized understanding of the instructions. No further questions at time of discharge.  Disposition: Discharge  Condition: Good  ED Discharge Orders          Ordered    oxyCODONE (ROXICODONE) 5 MG immediate release tablet  Every 6 hours PRN        03/12/23 0716    ondansetron (ZOFRAN-ODT) 4 MG disintegrating tablet  Every 8 hours PRN        03/12/23 0716              Follow Up: [name], MD [address] [place] [address] [phone]  Call  as needed     This chart was dictated using voice recognition software.  Despite best efforts to proofread,  errors can occur which can change the documentation meaning.    Nira Conn, MD 03/12/23 [phone]

## 2023-03-11 NOTE — ED Triage Notes (Signed)
Pt arriving via [facility] from home for emesis and HTN. Pt had trach removed on the 14th. Tramadol today @ 3pm, N/V since. Zofran 4mg  IM given by EMS.

## 2023-03-12 LAB — CBC WITH DIFFERENTIAL/PLATELET
Abs Immature Granulocytes: 0.1 10*3/uL — ABNORMAL HIGH (ref 0.00–0.07)
Basophils Absolute: 0 10*3/uL (ref 0.0–0.1)
Basophils Relative: 0 %
Eosinophils Absolute: 0 10*3/uL (ref 0.0–0.5)
Eosinophils Relative: 0 %
HCT: 33.5 % — ABNORMAL LOW (ref 39.0–52.0)
Hemoglobin: 10.9 g/dL — ABNORMAL LOW (ref 13.0–17.0)
Immature Granulocytes: 2 %
Lymphocytes Relative: 14 %
Lymphs Abs: 0.8 10*3/uL (ref 0.7–4.0)
MCH: 30.6 pg (ref 26.0–34.0)
MCHC: 32.5 g/dL (ref 30.0–36.0)
MCV: 94.1 fL (ref 80.0–100.0)
Monocytes Absolute: 0.3 10*3/uL (ref 0.1–1.0)
Monocytes Relative: 5 %
Neutro Abs: 4.8 10*3/uL (ref 1.7–7.7)
Neutrophils Relative %: 79 %
Platelets: 411 10*3/uL — ABNORMAL HIGH (ref 150–400)
RBC: 3.56 MIL/uL — ABNORMAL LOW (ref 4.22–5.81)
RDW: 14.2 % (ref 11.5–15.5)
WBC: 6 10*3/uL (ref 4.0–10.5)
nRBC: 0 % (ref 0.0–0.2)

## 2023-03-12 LAB — COMPREHENSIVE METABOLIC PANEL
ALT: 29 U/L (ref 0–44)
AST: 22 U/L (ref 15–41)
Albumin: 3.4 g/dL — ABNORMAL LOW (ref 3.5–5.0)
Alkaline Phosphatase: 100 U/L (ref 38–126)
Anion gap: 6 (ref 5–15)
BUN: 22 mg/dL (ref 8–23)
CO2: 25 mmol/L (ref 22–32)
Calcium: 8.4 mg/dL — ABNORMAL LOW (ref 8.9–10.3)
Chloride: 100 mmol/L (ref 98–111)
Creatinine, Ser: 0.74 mg/dL (ref 0.61–1.24)
GFR, Estimated: >60 mL/min (ref >60)
Glucose, Bld: 101 mg/dL — ABNORMAL HIGH (ref 70–99)
Potassium: 4.3 mmol/L (ref 3.5–5.1)
Sodium: 131 mmol/L — ABNORMAL LOW (ref 135–145)
Total Bilirubin: 0.7 mg/dL (ref 0.3–1.2)
Total Protein: 7.1 g/dL (ref 6.5–8.1)

## 2023-03-12 LAB — LIPASE, BLOOD: Lipase: 26 U/L (ref 11–51)

## 2023-03-12 MED ORDER — OXYCODONE HCL 5 MG PO TABS
ORAL_TABLET | ORAL | Status: AC
  Filled 2023-03-12: qty 1

## 2023-03-12 MED ORDER — ONDANSETRON 4 MG PO TBDP: 4.0000 mg | ORAL_TABLET | Freq: Three times a day (TID) | ORAL | 0 refills | Status: AC | PRN

## 2023-03-12 MED ORDER — ACETAMINOPHEN 500 MG PO TABS
ORAL_TABLET | ORAL | Status: AC
  Filled 2023-03-12: qty 2

## 2023-03-12 MED ORDER — ONDANSETRON 4 MG PO TBDP
4.0000 mg | ORAL_TABLET | Freq: Once | ORAL | Status: AC
  Administered 2023-03-12: 4 mg via ORAL

## 2023-03-12 MED ORDER — OXYCODONE HCL 5 MG PO TABS
5.0000 mg | ORAL_TABLET | Freq: Once | ORAL | Status: AC
  Administered 2023-03-12: 5 mg via ORAL

## 2023-03-12 MED ORDER — ACETAMINOPHEN 325 MG PO TABS
650.0000 mg | ORAL_TABLET | Freq: Once | ORAL | Status: AC
  Administered 2023-03-12: 650 mg via ORAL

## 2023-03-12 MED ORDER — FENTANYL CITRATE PF 50 MCG/ML IJ SOSY
25.0000 ug | PREFILLED_SYRINGE | Freq: Once | INTRAMUSCULAR | Status: AC
  Administered 2023-03-12: 25 ug via INTRAVENOUS

## 2023-03-12 MED ORDER — ONDANSETRON 4 MG PO TBDP
ORAL_TABLET | ORAL | Status: AC
  Filled 2023-03-12: qty 1

## 2023-03-12 MED ORDER — OXYCODONE HCL 5 MG PO TABS: 2.5000 mg | ORAL_TABLET | Freq: Four times a day (QID) | ORAL | 0 refills | Status: AC | PRN

## 2023-03-12 MED ORDER — FENTANYL CITRATE PF 50 MCG/ML IJ SOSY
PREFILLED_SYRINGE | INTRAMUSCULAR | Status: AC
  Filled 2023-03-12: qty 1

## 2023-03-12 NOTE — ED Notes (Signed)
Pt reports nausea is better but continues to have back pain. Will give diet gingerale for p.o. challenge and then administer Oxy IR as ordered. Provider aware. Pt using urinal

## 2023-03-12 NOTE — ED Notes (Signed)
Pt reported he needed his trach cleaned, respiratory notified.

## 2023-03-12 NOTE — ED Notes (Signed)
Pt has a trach and uses a writing pad to communicate

## 2023-03-12 NOTE — Discharge Instructions (Addendum)
For pain control you may take 1000 mg of Tylenol every 8 hours scheduled.  In addition you can take 0.5 to 1 tablet of Oxycodone every 6 hours as needed for pain not controlled with the scheduled Tylenol.  

## 2023-03-12 NOTE — ED Notes (Signed)
Josh RT at bedside assisting pt in cleaning trach.

## 2023-03-12 NOTE — Progress Notes (Signed)
PT confirms he is breathing well at this time and nothing is needed from RT services at this time. Vitals are documented on Flowsheet. No respiratory distress observed at this time. BBS diminished clear.

## 2023-03-30 ENCOUNTER — Ambulatory Visit: Payer: Medicare Other | Admitting: Podiatry

## 2023-04-07 ENCOUNTER — Ambulatory Visit: Payer: Medicare Other | Admitting: Podiatry

## 2023-04-07 ENCOUNTER — Encounter: Payer: Self-pay | Admitting: Podiatry

## 2023-04-07 DIAGNOSIS — M79674 Pain in right toe(s): Secondary | ICD-10-CM

## 2023-04-07 DIAGNOSIS — I739 Peripheral vascular disease, unspecified: Secondary | ICD-10-CM

## 2023-04-07 DIAGNOSIS — Z794 Long term (current) use of insulin: Secondary | ICD-10-CM | POA: Diagnosis not present

## 2023-04-07 DIAGNOSIS — E1159 Type 2 diabetes mellitus with other circulatory complications: Secondary | ICD-10-CM | POA: Diagnosis not present

## 2023-04-07 DIAGNOSIS — M79675 Pain in left toe(s): Secondary | ICD-10-CM | POA: Diagnosis not present

## 2023-04-07 DIAGNOSIS — B351 Tinea unguium: Secondary | ICD-10-CM | POA: Diagnosis not present

## 2023-04-07 NOTE — Progress Notes (Signed)
  Subjective:  Patient ID: [name], male    DOB: 22-Sep-1950,   MRN: [id number]  Chief Complaint  Patient presents with   Nail Problem    DFC x 5    72 y.o. male presents for concern of thickened elongated and painful nails that are difficult to trim. Requesting to have them trimmed today. Relates burning and tingling in their feet. Patient is diabetic and last A1c was  Lab Results  Component Value Date   HGBA1C 8.0 (H) 12/09/2022   .   PCP:  [name], MD    . Denies any other pedal complaints. Denies n/v/f/c.   Past Medical History:  Diagnosis Date   Cancer (HCC)    throat   Cigarette smoker    COPD (chronic obstructive pulmonary disease) (HCC)    Coronary artery disease    Diabetes mellitus    type 1  x 50 yrs   Diabetic retinopathy (HCC)    Dyslipidemia    Hypertension    Myocardial infarction (HCC)    "a long time ago" from insulin pump   Peripheral vascular disease (HCC)     Objective:  Physical Exam: Vascular: DP/PT pulses 2/4 bilateral. CFT <3 seconds. Absent hair growth on digits. Edema noted to bilateral lower extremities. Xerosis noted bilaterally.  Skin. No lacerations or abrasions bilateral feet. Nails 1-5 right  are thickened discolored and elongated with subungual debris.  Musculoskeletal: MMT 5/5 bilateral lower extremities in DF, PF, Inversion and Eversion. Deceased ROM in DF of ankle joint. 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  Neurological: Sensation intact to light touch. Protective sensation diminished bilateral.    Assessment:   1. Pain due to onychomycosis of toenails of both feet   2. Controlled type 2 diabetes mellitus with other circulatory complication, with long-term current use of insulin (HCC)       Plan:  Patient was evaluated and treated and all questions answered. -Discussed and educated patient on diabetic foot care, especially with  regards to the vascular, neurological and musculoskeletal systems.  -Stressed the  importance of good glycemic control and the detriment of not  controlling glucose levels in relation to the foot. -Discussed supportive shoes at all times and checking feet regularly.  -Mechanically debrided all nails 1-5 right using sterile nail nipper and filed with dremel without incident  -Answered all patient questions -Patient to return  in 3 months for at risk foot care -Patient advised to call the office if any problems or questions arise in the meantime.   Louann Sjogren, DPM

## 2023-04-19 ENCOUNTER — Ambulatory Visit: Payer: Medicare Other | Admitting: Cardiology

## 2023-04-20 ENCOUNTER — Telehealth: Payer: Self-pay | Admitting: Family

## 2023-04-20 NOTE — Telephone Encounter (Signed)
Can you please call pt to let him know he needs an appointment here for follow up on new prosthetic Rx. We cannot send in new referral to [facility] as he hasn't been seen here since 2022 and insurance will not allow Rx unless he has a face to face visit. You can schedule with Dr. Lajoyce Corners or Erin's first available.

## 2023-04-29 NOTE — Telephone Encounter (Signed)
Thank you so much

## 2023-05-03 DIAGNOSIS — R490 Dysphonia: Secondary | ICD-10-CM | POA: Insufficient documentation

## 2023-05-18 ENCOUNTER — Encounter: Payer: Self-pay | Admitting: Family

## 2023-05-18 ENCOUNTER — Ambulatory Visit: Payer: Medicare Other | Admitting: Family

## 2023-05-18 DIAGNOSIS — Z89512 Acquired absence of left leg below knee: Secondary | ICD-10-CM

## 2023-05-18 DIAGNOSIS — S88112D Complete traumatic amputation at level between knee and ankle, left lower leg, subsequent encounter: Secondary | ICD-10-CM

## 2023-05-18 NOTE — Progress Notes (Signed)
Office Visit Note   Patient: [name]           Date of Birth: [birth year]           MRN: [id number] Visit Date: 05/18/2023              Requested by: [name], MD [address] [place],  [place] [address] PCP: [name], MD  Chief Complaint  Patient presents with   Left Leg - Follow-up      HPI: The patient is a 72 year old gentleman who presents today for evaluation of his left residual limb status post below-knee amputation, remote.  He reports his liners were worn out and broken down  Assessment & Plan: Visit Diagnoses: No diagnosis found.  Plan: Given an order for prosthesis supplies and liners to [facility] he will follow-up as needed  Follow-Up Instructions: Return if symptoms worsen or fail to improve.   Ortho Exam  Patient is alert, oriented, no adenopathy, well-dressed, normal affect, normal respiratory effort. On examination left residual limb this is well consolidated well-healed there is no callus no impending skin breakdown.  Imaging: No results found. No images are attached to the encounter.  Labs: Lab Results  Component Value Date   HGBA1C 8.0 (H) 12/09/2022   HGBA1C 8.0 (H) 11/14/2019   HGBA1C 9.1 (H) 10/24/2015   REPTSTATUS 12/14/2022 FINAL 12/12/2022   GRAMSTAIN  12/12/2022    RARE WBC PRESENT, PREDOMINANTLY PMN MODERATE GRAM NEGATIVE RODS FEW GRAM POSITIVE COCCI    CULT  12/12/2022    MODERATE Normal respiratory flora-no Staph aureus or Pseudomonas seen Performed at [facility], [address] [place], [place] [address]    LABORGA ENTEROBACTER CLOACAE 04/09/2020   LABORGA STAPHYLOCOCCUS AUREUS 04/09/2020     Lab Results  Component Value Date   ALBUMIN 3.4 (L) 03/11/2023   ALBUMIN 2.2 (L) 02/17/2023   ALBUMIN 2.2 (L) 02/16/2023    Lab Results  Component Value Date   MG 1.9 02/17/2023   MG 2.0 02/16/2023   MG 1.9 02/15/2023   No results found for: "VD25OH"  No results found for: "PREALBUMIN"    Latest  Ref Rng & Units 03/11/2023   11:56 PM 02/20/2023    1:12 PM 02/17/2023   12:24 AM  CBC EXTENDED  WBC 4.0 - 10.5 K/uL 6.0   12.6   RBC 4.22 - 5.81 MIL/uL 3.56   2.98   Hemoglobin 13.0 - 17.0 g/dL 18.8  9.9  9.3   HCT 41.6 - 52.0 % 33.5  29.0  27.4   Platelets 150 - 400 K/uL 411   552   NEUT# 1.7 - 7.7 K/uL 4.8     Lymph# 0.7 - 4.0 K/uL 0.8        There is no height or weight on file to calculate BMI.  Orders:  No orders of the defined types were placed in this encounter.  No orders of the defined types were placed in this encounter.    Procedures: No procedures performed  Clinical Data: No additional findings.  ROS:  All other systems negative, except as noted in the HPI. Review of Systems  Objective: Vital Signs: There were no vitals taken for this visit.  Specialty Comments:  No specialty comments available.  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 12/09/2022   Adrenal adenoma, left 12/09/2022   Essential hypertension 12/09/2022   Dyspnea 11/27/2022   Elevated troponin 11/27/2022   Cough 10/21/2017   Wheezing 09/21/2017   Below-knee amputation of left lower extremity (HCC) 12/06/2015   Diabetic retinopathy associated with type 1 diabetes mellitus (HCC) 12/27/2014   Pleurodynia 11/23/2014   Type 1 diabetes mellitus on insulin therapy (HCC) 07/30/2014   Claudication in peripheral vascular disease (HCC) 07/30/2014   Male erectile disorder 08/10/2013   PVD (peripheral vascular disease) s/p R  fem-pop bypass and L BKA 04/15/2011   Hyperlipidemia 04/15/2011   Polyneuropathy 10/11/2009   Carotid artery occlusion 06/27/2009   Tobacco user 06/18/2009   Past Medical History:  Diagnosis Date   Cancer (HCC)    throat   Cigarette smoker    COPD (chronic obstructive pulmonary disease) (HCC)    Coronary artery disease    Diabetes mellitus    type 1  x 50 yrs   Diabetic retinopathy (HCC)    Dyslipidemia    Hypertension    Myocardial infarction (HCC)    "a long time ago" from insulin pump   Peripheral vascular disease (HCC)     Family History  Problem Relation Age of Onset   Coronary artery disease Mother    Other Father        Declined after a fall    Past Surgical History:  Procedure Laterality Date   ABDOMINAL ANGIOGRAM N/A 05/29/2014   Procedure: ABDOMINAL ANGIOGRAM;  Surgeon: Pamella Pert, MD;  Location: [facility] CATH LAB;  Service: Cardiovascular;  Laterality: N/A;   ABDOMINAL AORTOGRAM W/LOWER EXTREMITY N/A 11/14/2019   Procedure: ABDOMINAL AORTOGRAM W/LOWER EXTREMITY;  Surgeon: Nada Libman, MD;  Location: [facility] INVASIVE CV LAB;  Service: Cardiovascular;  Laterality: N/A;   AMPUTATION Left 10/25/2015   Procedure: Left Foot 5th Ray Amputation;  Surgeon: Nadara Mustard, MD;  Location: [facility] OR;  Service: Orthopedics;  Laterality: Left;   AMPUTATION Left 12/06/2015   Procedure: AMPUTATION BELOW KNEE;  Surgeon: Nadara Mustard, MD;  Location: [facility] OR;  Service: Orthopedics;  Laterality: Left;   CARDIAC CATHETERIZATION     EF is 55-60% and no wall motion abnormalities (long long time ago)   DIRECT LARYNGOSCOPY N/A 12/10/2022   Procedure: DIRECT LARYNGOSCOPY WITH BIOPSY;  Surgeon: Serena Colonel, MD;  Location: [facility] OR;  Service: ENT;  Laterality: N/A;   FEMORAL-POPLITEAL BYPASS GRAFT Left 10/24/2015   Procedure: BYPASS GRAFT FEMORAL below knee POPLITEAL ARTERY with Left Saphenous Vein;  Surgeon: Nada Libman, MD;  Location: [facility] OR;  Service: Vascular;  Laterality: Left;    FEMORAL-POPLITEAL BYPASS GRAFT Right 11/15/2019   Procedure: BYPASS GRAFT FEMORAL-POPLITEAL ARTERY using Right Leg Greater Saphenous Vein;  Surgeon: Nada Libman, MD;  Location: [facility] OR;  Service: Vascular;  Laterality: Right;   FRACTURE SURGERY     left arm "many yrs ago"   INCISION AND DRAINAGE ABSCESS N/A 02/20/2023   Procedure: INCISION AND DRAINAGE  NECK ABSCESS AND DRAIN PLACEMENT;  Surgeon: Serena Colonel, MD;  Location: [facility] OR;  Service: ENT;  Laterality: N/ALillia Corporal N/A 02/05/2023   Procedure: TOTAL LARYNGECTOMY;  Surgeon: Serena Colonel, MD;  Location: [facility] OR;  Service: ENT;  Laterality: N/A;   LOWER EXTREMITY  ANGIOGRAM N/A 01/23/2014   Procedure: LOWER EXTREMITY ANGIOGRAM;  Surgeon: Pamella Pert, MD;  Location: [facility] CATH LAB;  Service: Cardiovascular;  Laterality: N/A;   LOWER EXTREMITY ANGIOGRAM N/A 07/31/2014   Procedure: LOWER EXTREMITY ANGIOGRAM;  Surgeon: Pamella Pert, MD;  Location: [facility] CATH LAB;  Service: Cardiovascular;  Laterality: N/A;   LOWER EXTREMITY ANGIOGRAM Left 10/11/2015   Procedure: Lower Extremity Angiogram;  Surgeon: Nada Libman, MD;  Location: [facility] INVASIVE CV LAB;  Service: Cardiovascular;  Laterality: Left;   PERIPHERAL VASCULAR BALLOON ANGIOPLASTY  11/14/2019   Procedure: PERIPHERAL VASCULAR BALLOON ANGIOPLASTY;  Surgeon: Nada Libman, MD;  Location: [facility] INVASIVE CV LAB;  Service: Cardiovascular;;   PERIPHERAL VASCULAR CATHETERIZATION Left 10/11/2015   Procedure: Peripheral Vascular Balloon Angioplasty;  Surgeon: Nada Libman, MD;  Location: [facility] INVASIVE CV LAB;  Service: Cardiovascular;  Laterality: Left;  sfa failed unable to cross occluded sfa   PERIPHERAL VASCULAR CATHETERIZATION N/A 10/11/2015   Procedure: Abdominal Aortogram;  Surgeon: Nada Libman, MD;  Location: [facility] INVASIVE CV LAB;  Service: Cardiovascular;  Laterality: N/A;   RADICAL NECK DISSECTION Bilateral 02/05/2023   Procedure: NECK DISSECTION;  Surgeon: Serena Colonel, MD;   Location: [facility] OR;  Service: ENT;  Laterality: Bilateral;   STUMP REVISION Left 03/02/2016   Procedure: Revision Left Below Knee Amputation;  Surgeon: Nadara Mustard, MD;  Location: [facility] OR;  Service: Orthopedics;  Laterality: Left;   TOTAL LARYNGECTOMY N/A 02/05/2023   Per surgical report on 02/05/2023   TRACHEOSTOMY TUBE PLACEMENT N/A 12/10/2022   Procedure: TRACHEOSTOMY UNDER LOCAL;  Surgeon: Serena Colonel, MD;  Location[facility] OR;  Service: ENT;  Laterality: N/A;   Social History   Occupational History   Not on file  Tobacco Use   Smoking status: Former    Current packs/day: 2.00    Average packs/day: 2.0 packs/day for 30.0 years (60.0 ttl pk-yrs)    Types: Cigarettes   Smokeless tobacco: Never  Vaping Use   Vaping status: Never Used  Substance and Sexual Activity   Alcohol use: Yes    Comment: on occasion wine   Drug use: No   Sexual activity: Not on file

## 2023-06-22 ENCOUNTER — Other Ambulatory Visit: Payer: Self-pay

## 2023-06-22 ENCOUNTER — Encounter (HOSPITAL_BASED_OUTPATIENT_CLINIC_OR_DEPARTMENT_OTHER): Payer: Self-pay | Admitting: Otolaryngology

## 2023-06-23 NOTE — H&P (Signed)
[name] is an 72 y.o. male.   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  Past Medical History:  Diagnosis Date   Cancer (HCC)    throat   Cigarette smoker    COPD (chronic obstructive pulmonary disease) (HCC)    Coronary artery disease    Diabetes mellitus    type 1  x 50 yrs   Diabetic retinopathy (HCC)    Dyslipidemia    Hypertension    Myocardial infarction [facility])    "a long time ago" from insulin pump   Peripheral vascular disease (HCC)     Past Surgical History:  Procedure Laterality Date   ABDOMINAL ANGIOGRAM N/A 05/29/2014   Procedure: ABDOMINAL ANGIOGRAM;  Surgeon: Pamella Pert, MD;  Location: [facility] CATH LAB;  Service: Cardiovascular;  Laterality: N/A;   ABDOMINAL AORTOGRAM W/LOWER EXTREMITY N/A 11/14/2019   Procedure: ABDOMINAL AORTOGRAM W/LOWER EXTREMITY;  Surgeon: Nada Libman, MD;  Location: [facility] INVASIVE CV LAB;  Service: Cardiovascular;  Laterality: N/A;   AMPUTATION Left 10/25/2015   Procedure: Left Foot 5th Ray Amputation;  Surgeon: Nadara Mustard, MD;  Location: [facility] OR;  Service: Orthopedics;  Laterality: Left;   AMPUTATION Left 12/06/2015   Procedure: AMPUTATION BELOW KNEE;  Surgeon: Nadara Mustard, MD;  Location: [facility] OR;  Service: Orthopedics;  Laterality: Left;   CARDIAC CATHETERIZATION     EF is 55-60% and no wall motion abnormalities (long long time ago)   DIRECT LARYNGOSCOPY N/A 12/10/2022   Procedure: DIRECT LARYNGOSCOPY WITH BIOPSY;  Surgeon: Serena Colonel, MD;  Location: [facility] OR;  Service: ENT;  Laterality: N/A;   FEMORAL-POPLITEAL BYPASS GRAFT Left 10/24/2015   Procedure: BYPASS GRAFT FEMORAL below knee POPLITEAL ARTERY with Left Saphenous Vein;  Surgeon: Nada Libman, MD;  Location: [facility] OR;  Service: Vascular;  Laterality: Left;   FEMORAL-POPLITEAL BYPASS GRAFT Right 11/15/2019   Procedure: BYPASS GRAFT FEMORAL-POPLITEAL ARTERY using Right Leg Greater Saphenous Vein;  Surgeon: Nada Libman, MD;   Location: [facility] OR;  Service: Vascular;  Laterality: Right;   FRACTURE SURGERY     left arm "many yrs ago"   INCISION AND DRAINAGE ABSCESS N/A 02/20/2023   Procedure: INCISION AND DRAINAGE  NECK ABSCESS AND DRAIN PLACEMENT;  Surgeon: Serena Colonel, MD;  Location: [facility] OR;  Service: ENT;  Laterality: N/ALillia Corporal N/A 02/05/2023   Procedure: TOTAL LARYNGECTOMY;  Surgeon: Serena Colonel, MD;  Location: [facility] OR;  Service: ENT;  Laterality: N/A;   LOWER EXTREMITY ANGIOGRAM N/A 01/23/2014   Procedure: LOWER EXTREMITY ANGIOGRAM;  Surgeon: Pamella Pert, MD;  Location: [facility] CATH LAB;  Service: Cardiovascular;  Laterality: N/A;   LOWER EXTREMITY ANGIOGRAM N/A 07/31/2014   Procedure: LOWER EXTREMITY ANGIOGRAM;  Surgeon: Pamella Pert, MD;  Location: [facility] CATH LAB;  Service: Cardiovascular;  Laterality: N/A;   LOWER EXTREMITY ANGIOGRAM Left 10/11/2015   Procedure: Lower Extremity Angiogram;  Surgeon: Nada Libman, MD;  Location: [facility] INVASIVE CV LAB;  Service: Cardiovascular;  Laterality: Left;   PERIPHERAL VASCULAR BALLOON ANGIOPLASTY  11/14/2019   Procedure: PERIPHERAL VASCULAR BALLOON ANGIOPLASTY;  Surgeon: Nada Libman, MD;  Location: [facility] INVASIVE CV LAB;  Service: Cardiovascular;;   PERIPHERAL VASCULAR CATHETERIZATION Left 10/11/2015   Procedure: Peripheral Vascular Balloon Angioplasty;  Surgeon: Nada Libman, MD;  Location: [facility] INVASIVE CV LAB;  Service: Cardiovascular;  Laterality: Left;  sfa failed unable to cross occluded sfa   PERIPHERAL VASCULAR CATHETERIZATION N/A 10/11/2015   Procedure: Abdominal Aortogram;  Surgeon: Nada Libman, MD;  Location: [facility] INVASIVE CV LAB;  Service: Cardiovascular;  Laterality: N/A;   RADICAL NECK DISSECTION Bilateral 02/05/2023   Procedure: NECK DISSECTION;  Surgeon: Serena Colonel, MD;  Location: [facility] OR;  Service: ENT;  Laterality: Bilateral;   STUMP REVISION Left 03/02/2016   Procedure: Revision Left Below Knee Amputation;  Surgeon: Nadara Mustard, MD;   Location: [facility] OR;  Service: Orthopedics;  Laterality: Left;   TOTAL LARYNGECTOMY N/A 02/05/2023   Per surgical report on 02/05/2023   TRACHEOSTOMY TUBE PLACEMENT N/A 12/10/2022   Procedure: TRACHEOSTOMY UNDER LOCAL;  Surgeon: Serena Colonel, MD;  Location: [facility] OR;  Service: ENT;  Laterality: N/A;    Family History  Problem Relation Age of Onset   Coronary artery disease Mother    Other Father        Declined after a fall   Social History:  reports that he has quit smoking. His smoking use included cigarettes. He has a 60 pack-year smoking history. He has never used smokeless tobacco. He reports current alcohol use. He reports that he does not use drugs.  Allergies:  Allergies  Allergen Reactions   Simvastatin Other (See Comments)    MYALGIAS, MUSCLE WEAKNESS     No medications prior to admission.    No results found for this or any previous visit (from the past 48 hour(s)). No results found.  ROS: otherwise negative  Height 5\' 11"  (1.803 m), weight 65.8 kg.  PHYSICAL EXAM: Overall appearance:  Healthy appearing, in no distress Head:  Normocephalic, atraumatic. Ears: External auditory canals are clear; tympanic membranes are intact and the middle ears are free of any effusion. Nose: External nose is healthy in appearance. Internal nasal exam free of any lesions or obstruction. Oral Cavity/pharynx:  There are no mucosal lesions or masses identified. Neuro:  No identifiable neurologic deficits. Neck: Stoma healthy and clear. No palpable neck masses.  Studies Reviewed: none    Assessment/Plan Proceed with TEP placement.  Serena Colonel 06/23/2023, 9:21 AM

## 2023-06-24 ENCOUNTER — Encounter (HOSPITAL_BASED_OUTPATIENT_CLINIC_OR_DEPARTMENT_OTHER)
Admission: RE | Admit: 2023-06-24 | Discharge: 2023-06-24 | Disposition: A | Discharge status: Home / Self Care | Payer: Medicare Other | Source: Ambulatory Visit | Attending: Otolaryngology | Admitting: Otolaryngology

## 2023-06-24 DIAGNOSIS — Z01812 Encounter for preprocedural laboratory examination: Secondary | ICD-10-CM | POA: Insufficient documentation

## 2023-06-24 LAB — BASIC METABOLIC PANEL
Anion gap: 7 (ref 5–15)
BUN: 16 mg/dL (ref 8–23)
CO2: 25 mmol/L (ref 22–32)
Calcium: 9 mg/dL (ref 8.9–10.3)
Chloride: 103 mmol/L (ref 98–111)
Creatinine, Ser: 0.84 mg/dL (ref 0.61–1.24)
GFR, Estimated: >60 mL/min (ref >60)
Glucose, Bld: 301 mg/dL — ABNORMAL HIGH (ref 70–99)
Potassium: 5.3 mmol/L — ABNORMAL HIGH (ref 3.5–5.1)
Sodium: 135 mmol/L (ref 135–145)

## 2023-06-24 NOTE — Progress Notes (Signed)
Chart reviewed with Dr. Isaias Cowman due to patient history. Ok to proceed with surgery at [facility].

## 2023-06-25 NOTE — Progress Notes (Signed)
K+ 5.3, Dr. Lanetta Inch aware, will proceed with surgery as scheduled.

## 2023-06-28 ENCOUNTER — Encounter (HOSPITAL_BASED_OUTPATIENT_CLINIC_OR_DEPARTMENT_OTHER)
Admission: RE | Disposition: A | Discharge status: Home / Self Care | Payer: Self-pay | Source: Home / Self Care | Attending: Otolaryngology

## 2023-06-28 ENCOUNTER — Other Ambulatory Visit: Payer: Self-pay

## 2023-06-28 ENCOUNTER — Ambulatory Visit (HOSPITAL_BASED_OUTPATIENT_CLINIC_OR_DEPARTMENT_OTHER): Payer: Medicare Other | Admitting: Anesthesiology

## 2023-06-28 ENCOUNTER — Encounter (HOSPITAL_BASED_OUTPATIENT_CLINIC_OR_DEPARTMENT_OTHER): Payer: Self-pay | Admitting: Otolaryngology

## 2023-06-28 ENCOUNTER — Ambulatory Visit (HOSPITAL_BASED_OUTPATIENT_CLINIC_OR_DEPARTMENT_OTHER)
Admission: RE | Admit: 2023-06-28 | Discharge: 2023-06-28 | Disposition: A | Discharge status: Home / Self Care | Payer: Medicare Other | Attending: Otolaryngology | Admitting: Otolaryngology

## 2023-06-28 DIAGNOSIS — C329 Malignant neoplasm of larynx, unspecified: Secondary | ICD-10-CM

## 2023-06-28 DIAGNOSIS — J449 Chronic obstructive pulmonary disease, unspecified: Secondary | ICD-10-CM | POA: Insufficient documentation

## 2023-06-28 DIAGNOSIS — Z89512 Acquired absence of left leg below knee: Secondary | ICD-10-CM | POA: Diagnosis not present

## 2023-06-28 DIAGNOSIS — E119 Type 2 diabetes mellitus without complications: Secondary | ICD-10-CM | POA: Diagnosis not present

## 2023-06-28 DIAGNOSIS — Z794 Long term (current) use of insulin: Secondary | ICD-10-CM | POA: Insufficient documentation

## 2023-06-28 DIAGNOSIS — R491 Aphonia: Secondary | ICD-10-CM | POA: Diagnosis not present

## 2023-06-28 DIAGNOSIS — I252 Old myocardial infarction: Secondary | ICD-10-CM | POA: Diagnosis not present

## 2023-06-28 DIAGNOSIS — Z8249 Family history of ischemic heart disease and other diseases of the circulatory system: Secondary | ICD-10-CM | POA: Diagnosis not present

## 2023-06-28 DIAGNOSIS — I2510 Atherosclerotic heart disease of native coronary artery without angina pectoris: Secondary | ICD-10-CM | POA: Diagnosis not present

## 2023-06-28 DIAGNOSIS — Z87891 Personal history of nicotine dependence: Secondary | ICD-10-CM | POA: Diagnosis not present

## 2023-06-28 DIAGNOSIS — Z01818 Encounter for other preprocedural examination: Secondary | ICD-10-CM

## 2023-06-28 DIAGNOSIS — E11319 Type 2 diabetes mellitus with unspecified diabetic retinopathy without macular edema: Secondary | ICD-10-CM | POA: Insufficient documentation

## 2023-06-28 DIAGNOSIS — E1151 Type 2 diabetes mellitus with diabetic peripheral angiopathy without gangrene: Secondary | ICD-10-CM | POA: Diagnosis not present

## 2023-06-28 DIAGNOSIS — I10 Essential (primary) hypertension: Secondary | ICD-10-CM | POA: Diagnosis not present

## 2023-06-28 HISTORY — PX: PLACEMENT OF TRACHEAL ESOPHAGEAL PROTHESIS, ESOPHAGOSCOPY WITH DILATION: SHX5564

## 2023-06-28 LAB — GLUCOSE, CAPILLARY
Glucose-Capillary: 116 mg/dL — ABNORMAL HIGH (ref 70–99)
Glucose-Capillary: 140 mg/dL — ABNORMAL HIGH (ref 70–99)

## 2023-06-28 SURGERY — INSERTION, VOICE PROSTHESIS, TRACHEOESOPHAGEAL
Anesthesia: General | Site: Throat

## 2023-06-28 MED ORDER — PROPOFOL 10 MG/ML IV BOLUS
INTRAVENOUS | Status: AC
  Filled 2023-06-28: qty 20

## 2023-06-28 MED ORDER — FENTANYL CITRATE (PF) 100 MCG/2ML IJ SOLN
25.0000 ug | INTRAMUSCULAR | Status: DC | PRN
Start: 2023-06-28 — End: 2023-06-28

## 2023-06-28 MED ORDER — ACETAMINOPHEN 10 MG/ML IV SOLN: 1000.0000 mg | Freq: Once | INTRAVENOUS | Status: DC | PRN

## 2023-06-28 MED ORDER — SODIUM CHLORIDE 0.9 % IV SOLN: INTRAVENOUS | Status: DC | PRN

## 2023-06-28 MED ORDER — AMISULPRIDE (ANTIEMETIC) 5 MG/2ML IV SOLN: 10.0000 mg | Freq: Once | INTRAVENOUS | Status: DC | PRN

## 2023-06-28 MED ORDER — EPHEDRINE SULFATE (PRESSORS) 50 MG/ML IJ SOLN
INTRAMUSCULAR | Status: DC | PRN
  Administered 2023-06-28: 10 mg via INTRAVENOUS

## 2023-06-28 MED ORDER — ONDANSETRON HCL 4 MG/2ML IJ SOLN
INTRAMUSCULAR | Status: DC | PRN
  Administered 2023-06-28: 4 mg via INTRAVENOUS

## 2023-06-28 MED ORDER — LACTATED RINGERS IV SOLN: INTRAVENOUS | Status: DC

## 2023-06-28 MED ORDER — ROCURONIUM BROMIDE 100 MG/10ML IV SOLN
INTRAVENOUS | Status: DC | PRN
  Administered 2023-06-28: 20 mg via INTRAVENOUS

## 2023-06-28 MED ORDER — KETOROLAC TROMETHAMINE 15 MG/ML IJ SOLN: 15.0000 mg | Freq: Once | INTRAMUSCULAR | Status: DC | PRN

## 2023-06-28 MED ORDER — PHENYLEPHRINE HCL (PRESSORS) 10 MG/ML IV SOLN
INTRAVENOUS | Status: DC | PRN
Start: 2023-06-28 — End: 2023-06-28
  Administered 2023-06-28 (×2): 80 ug via INTRAVENOUS

## 2023-06-28 MED ORDER — FENTANYL CITRATE (PF) 100 MCG/2ML IJ SOLN
INTRAMUSCULAR | Status: DC | PRN
  Administered 2023-06-28 (×2): 50 ug via INTRAVENOUS

## 2023-06-28 MED ORDER — ONDANSETRON HCL 4 MG/2ML IJ SOLN
INTRAMUSCULAR | Status: AC
  Filled 2023-06-28: qty 2

## 2023-06-28 MED ORDER — PROPOFOL 10 MG/ML IV BOLUS
INTRAVENOUS | Status: DC | PRN
  Administered 2023-06-28 (×2): 100 mg via INTRAVENOUS

## 2023-06-28 MED ORDER — SUGAMMADEX SODIUM 200 MG/2ML IV SOLN
INTRAVENOUS | Status: DC | PRN
  Administered 2023-06-28: 200 mg via INTRAVENOUS

## 2023-06-28 MED ORDER — DEXAMETHASONE SODIUM PHOSPHATE 10 MG/ML IJ SOLN
INTRAMUSCULAR | Status: AC
  Filled 2023-06-28: qty 1

## 2023-06-28 MED ORDER — FENTANYL CITRATE (PF) 100 MCG/2ML IJ SOLN
INTRAMUSCULAR | Status: AC
  Filled 2023-06-28: qty 2

## 2023-06-28 MED ORDER — DEXAMETHASONE SODIUM PHOSPHATE 4 MG/ML IJ SOLN
INTRAMUSCULAR | Status: DC | PRN
  Administered 2023-06-28: 4 mg via INTRAVENOUS

## 2023-06-28 MED ORDER — ONDANSETRON HCL 4 MG/2ML IJ SOLN: 4.0000 mg | Freq: Once | INTRAMUSCULAR | Status: DC | PRN

## 2023-06-28 SURGICAL SUPPLY — 39 items
APL SKNCLS STERI-STRIP NONHPOA (GAUZE/BANDAGES/DRESSINGS)
BENZOIN TINCTURE PRP APPL 2/3 (GAUZE/BANDAGES/DRESSINGS) IMPLANT
BLADE SURG 11 STRL SS (BLADE) IMPLANT
BLADE SURG 15 STRL LF DISP TIS (BLADE) IMPLANT
BLADE SURG 15 STRL SS (BLADE)
CANISTER SUCT 1200ML W/VALVE (MISCELLANEOUS) ×2 IMPLANT
CATH ROBINSON RED A/P 10FR (CATHETERS) IMPLANT
CATH ROBINSON RED A/P 14FR (CATHETERS) IMPLANT
CATH ROBINSON RED A/P 16FR (CATHETERS) IMPLANT
CLEANER CAUTERY TIP PAD (MISCELLANEOUS) IMPLANT
COVER BACK TABLE 60X90IN (DRAPES) IMPLANT
COVER MAYO STAND STRL (DRAPES) ×2 IMPLANT
ELECT COATED BLADE 2.86 ST (ELECTRODE) IMPLANT
ELECT REM PT RETURN 9FT ADLT (ELECTROSURGICAL)
ELECTRODE REM PT RTRN 9FT ADLT (ELECTROSURGICAL) IMPLANT
GAUZE SPONGE 4X4 12PLY STRL LF (GAUZE/BANDAGES/DRESSINGS) IMPLANT
GLOVE BIOGEL PI IND STRL 7.0 (GLOVE) IMPLANT
GLOVE ECLIPSE 7.5 STRL STRAW (GLOVE) ×2 IMPLANT
GLOVE SURG SS PI 7.0 STRL IVOR (GLOVE) IMPLANT
GOWN STRL REUS W/ TWL LRG LVL3 (GOWN DISPOSABLE) ×2 IMPLANT
GOWN STRL REUS W/ TWL XL LVL3 (GOWN DISPOSABLE) IMPLANT
GOWN STRL REUS W/TWL LRG LVL3 (GOWN DISPOSABLE) ×1
GOWN STRL REUS W/TWL XL LVL3 (GOWN DISPOSABLE) ×1
GUARD TEETH (MISCELLANEOUS) IMPLANT
NDL HYPO 27GX1-1/4 (NEEDLE) IMPLANT
NDL SAFETY ECLIPSE 18X1.5 (NEEDLE) IMPLANT
NEEDLE HYPO 27GX1-1/4 (NEEDLE)
PACK BASIN DAY SURGERY FS (CUSTOM PROCEDURE TRAY) ×2 IMPLANT
PENCIL FOOT CONTROL (ELECTRODE) IMPLANT
PROSTHESIS PROVOX VEGA 17FR (Prosthesis and Implant ENT) IMPLANT
SHEET MEDIUM DRAPE 40X70 STRL (DRAPES) ×2 IMPLANT
STRIP CLOSURE SKIN 1/2X4 (GAUZE/BANDAGES/DRESSINGS) IMPLANT
SUCTION TUBE FRAZIER 10FR DISP (SUCTIONS) IMPLANT
SUT SILK 0 SH 30 (SUTURE) IMPLANT
SUT SILK 2 0 PERMA HAND 18 BK (SUTURE) IMPLANT
SYR CONTROL 10ML LL (SYRINGE) IMPLANT
TAPE PAPER 3X10 WHT MICROPORE (GAUZE/BANDAGES/DRESSINGS) IMPLANT
TOWEL GREEN STERILE FF (TOWEL DISPOSABLE) ×2 IMPLANT
TUBE CONNECTING 20X1/4 (TUBING) ×2 IMPLANT

## 2023-06-28 NOTE — Transfer of Care (Signed)
Immediate Anesthesia Transfer of Care Note  Patient: [name]  Procedure(s) Performed: TRACHEAOESOPHAGEAL PUNCTURE (Throat)  Patient Location: PACU  Anesthesia Type:General  Level of Consciousness: sedated  Airway & Oxygen Therapy: Patient Spontanous Breathing and Patient connected to face mask oxygen  Post-op Assessment: Report given to RN and Post -op Vital signs reviewed and stable  Post vital signs: Reviewed and stable  Last Vitals:  Vitals Value Taken Time  BP 91/49 06/28/23 0930  Temp 36.9 C 06/28/23 0929  Pulse 63 06/28/23 0932  Resp 22 06/28/23 0932  SpO2 96 % 06/28/23 0932  Vitals shown include unfiled device data.  Last Pain:  Vitals:   06/28/23 0739  TempSrc: Temporal  PainSc: 0-No pain      Patients Stated Pain Goal: 3 (06/28/23 0739)  Complications: No notable events documented.

## 2023-06-28 NOTE — Discharge Instructions (Addendum)
Resume normal diet.  Resume activities tomorrow.   Post Anesthesia Home Care Instructions  Activity: Get plenty of rest for the remainder of the day. A responsible individual must stay with you for 24 hours following the procedure.  For the next 24 hours, DO NOT: -Drive a car -Advertising copywriter -Drink alcoholic beverages -Take any medication unless instructed by your physician -Make any legal decisions or sign important papers.  Meals: Start with liquid foods such as gelatin or soup. Progress to regular foods as tolerated. Avoid greasy, spicy, heavy foods. If nausea and/or vomiting occur, drink only clear liquids until the nausea and/or vomiting subsides. Call your physician if vomiting continues.  Special Instructions/Symptoms: Your throat may feel dry or sore from the anesthesia or the breathing tube placed in your throat during surgery. If this causes discomfort, gargle with warm salt water. The discomfort should disappear within 24 hours.  If you had a scopolamine patch placed behind your ear for the management of post- operative nausea and/or vomiting:  1. The medication in the patch is effective for 72 hours, after which it should be removed.  Wrap patch in a tissue and discard in the trash. Wash hands thoroughly with soap and water. 2. You may remove the patch earlier than 72 hours if you experience unpleasant side effects which may include dry mouth, dizziness or visual disturbances. 3. Avoid touching the patch. Wash your hands with soap and water after contact with the patch.

## 2023-06-28 NOTE — Anesthesia Postprocedure Evaluation (Signed)
Anesthesia Post Note  Patient: [name]  Procedure(s) Performed: TRACHEAOESOPHAGEAL PUNCTURE (Throat)     Patient location during evaluation: PACU Anesthesia Type: General Level of consciousness: awake Pain management: pain level controlled Vital Signs Assessment: post-procedure vital signs reviewed and stable Respiratory status: spontaneous breathing, nonlabored ventilation and respiratory function stable Cardiovascular status: blood pressure returned to baseline and stable Postop Assessment: no apparent nausea or vomiting Anesthetic complications: no   No notable events documented.  Last Vitals:  Vitals:   06/28/23 1000 06/28/23 1015  BP: (!) 117/54 (!) 122/54  Pulse: 63 69  Resp: 11 16  Temp:  36.9 C  SpO2: 93% 94%    Last Pain:  Vitals:   06/28/23 1015  TempSrc:   PainSc: 0-No pain                 Jove Beyl P Macrina Lehnert

## 2023-06-28 NOTE — Interval H&P Note (Signed)
History and Physical Interval Note:  06/28/2023 8:04 AM  [name]  has presented today for surgery, with the diagnosis of Alaryngeal voice; Laryngeal cancer.  The various methods of treatment have been discussed with the patient and family. After consideration of risks, benefits and other options for treatment, the patient has consented to  Procedure(s): TRACHEAOESOPHAGEAL PUNCTURE (N/A) as a surgical intervention.  The patient's history has been reviewed, patient examined, no change in status, stable for surgery.  I have reviewed the patient's chart and labs.  Questions were answered to the patient's satisfaction.     Serena Colonel

## 2023-06-28 NOTE — Anesthesia Preprocedure Evaluation (Addendum)
Anesthesia Evaluation  Patient identified by MRN, date of birth, ID band Patient awake    Reviewed: Allergy & Precautions, NPO status , Patient's Chart, lab work & pertinent test results  Airway Mallampati: Trach  TM Distance: >3 FB Neck ROM: Full    Dental   Pulmonary COPD, former smoker S/p tracheostomy    breath sounds clear to auscultation       Cardiovascular hypertension, Pt. on home beta blockers and Pt. on medications + CAD, + Past MI and + Peripheral Vascular Disease  Normal cardiovascular exam     Neuro/Psych  Neuromuscular disease  negative psych ROS   GI/Hepatic negative GI ROS, Neg liver ROS,,,  Endo/Other  diabetes, Insulin Dependent    Renal/GU negative Renal ROS     Musculoskeletal negative musculoskeletal ROS (+)    Abdominal   Peds  Hematology negative hematology ROS (+)   Anesthesia Other Findings   Reproductive/Obstetrics                             Anesthesia Physical Anesthesia Plan  ASA: 3  Anesthesia Plan: General   Post-op Pain Management:    Induction: Intravenous  PONV Risk Score and Plan: 2 and Ondansetron, Dexamethasone and Treatment may vary due to age or medical condition  Airway Management Planned: Tracheostomy  Additional Equipment:   Intra-op Plan:   Post-operative Plan: Extubation in OR  Informed Consent: I have reviewed the patients History and Physical, chart, labs and discussed the procedure including the risks, benefits and alternatives for the proposed anesthesia with the patient or authorized representative who has indicated his/her understanding and acceptance.       Plan Discussed with: CRNA and Surgeon  Anesthesia Plan Comments:        Anesthesia Quick Evaluation

## 2023-06-28 NOTE — Op Note (Signed)
OPERATIVE REPORT  DATE OF SURGERY: 06/28/2023  PATIENT:  [name],  72 y.o. male  PRE-OPERATIVE DIAGNOSIS:  Alaryngeal voice; Laryngeal cancer  POST-OPERATIVE DIAGNOSIS:  Alaryngeal voice; Laryngeal cancer  PROCEDURE:  Procedure(s): TRACHEAOESOPHAGEAL PUNCTURE  SURGEON:  Susy Frizzle, MD  ASSISTANTS: None  ANESTHESIA:   General   EBL: 0 ml  DRAINS: None  LOCAL MEDICATIONS USED:  None  SPECIMEN:  none  COUNTS:  Correct  PROCEDURE DETAILS: The patient was taken to the operating room and placed on the operating table in the supine position. Following induction of general endotracheal anesthesia, the neck was draped in a standard fashion.  The cervical esophagoscope was introduced into the oral cavity.  A maxillary tooth protector was used.  The scope was passed down to the esophageal wall just posterior to the tracheal stoma.  The scope was then turned on 180 degrees allowing the bevel to stick up towards the stoma.  The pro box Vega puncture kit was used, 17 [place], 10 mm, introducer was placed into the stoma and through the lumen of the esophagus scope.  The guidewire was then passed all the way through.  The scope was removed.  The upper end of the guidewire was attached to the dilator/prosthesis kit.  The dilator was then advanced through the oral cavity all the way down to the stomal puncture site and gradually dilated through to the external lumen of the stoma.  The prosthesis was then seated into place.  The attachment ends were cut off.  The prosthesis was turned to the proper position.  The patient was then awakened extubated and transferred to recovery in stable condition.    PATIENT DISPOSITION:  To PACU, stable

## 2023-06-28 NOTE — Anesthesia Procedure Notes (Signed)
Date/Time: 06/28/2023 8:59 AM  Performed by: Burna Cash, CRNAPre-anesthesia Checklist: Patient identified, Emergency Drugs available, Suction available and Patient being monitored Patient Re-evaluated:Patient Re-evaluated prior to induction Oxygen Delivery Method: Circle system utilized Preoxygenation: Pre-oxygenation with 100% oxygen Induction Type: IV induction Ventilation: Mask ventilation without difficulty Number of attempts: 1 Airway Equipment and Method: Stylet and Oral airway Placement Confirmation: ETT inserted through vocal cords under direct vision, positive ETCO2 and breath sounds checked- equal and bilateral Tube secured with: Tape Dental Injury: Teeth and Oropharynx as per pre-operative assessment  Comments: #7 reinforced ETT placed via stoma after induction

## 2023-06-29 ENCOUNTER — Encounter (HOSPITAL_BASED_OUTPATIENT_CLINIC_OR_DEPARTMENT_OTHER): Payer: Self-pay | Admitting: Otolaryngology

## 2023-07-13 ENCOUNTER — Encounter: Payer: Self-pay | Admitting: Podiatry

## 2023-07-13 ENCOUNTER — Ambulatory Visit (INDEPENDENT_AMBULATORY_CARE_PROVIDER_SITE_OTHER): Payer: Medicare Other | Admitting: Podiatry

## 2023-07-13 DIAGNOSIS — Z794 Long term (current) use of insulin: Secondary | ICD-10-CM | POA: Diagnosis not present

## 2023-07-13 DIAGNOSIS — E1159 Type 2 diabetes mellitus with other circulatory complications: Secondary | ICD-10-CM | POA: Diagnosis not present

## 2023-07-13 DIAGNOSIS — B351 Tinea unguium: Secondary | ICD-10-CM | POA: Diagnosis not present

## 2023-07-13 DIAGNOSIS — M79675 Pain in left toe(s): Secondary | ICD-10-CM | POA: Diagnosis not present

## 2023-07-13 DIAGNOSIS — M79674 Pain in right toe(s): Secondary | ICD-10-CM | POA: Diagnosis not present

## 2023-07-13 NOTE — Progress Notes (Signed)
  Subjective:  Patient ID: [name], male    DOB: [birth year],   MRN: [id number]  No chief complaint on file.   72 y.o. male presents for concern of thickened elongated and painful nails that are difficult to trim. Requesting to have them trimmed today. Relates burning and tingling in their feet. Patient is diabetic and last A1c was  Lab Results  Component Value Date   HGBA1C 8.0 (H) 12/09/2022   .   PCP:  [name], MD    . Denies any other pedal complaints. Denies n/v/f/c.   Past Medical History:  Diagnosis Date   Cancer (HCC)    throat   Cigarette smoker    COPD (chronic obstructive pulmonary disease) (HCC)    Coronary artery disease    Diabetes mellitus    type 1  x 50 yrs   Diabetic retinopathy (HCC)    Dyslipidemia    Hypertension    Myocardial infarction (HCC)    "a long time ago" from insulin pump   Peripheral vascular disease (HCC)     Objective:  Physical Exam: Vascular: DP/PT pulses 2/4 bilateral. CFT <3 seconds. Absent hair growth on digits. Edema noted to bilateral lower extremities. Xerosis noted bilaterally.  Skin. No lacerations or abrasions bilateral feet. Nails 1-5 right  are thickened discolored and elongated with subungual debris.  Musculoskeletal: MMT 5/5 bilateral lower extremities in DF, PF, Inversion and Eversion. Deceased ROM in DF of ankle joint. 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  Neurological: Sensation intact to light touch. Protective sensation diminished bilateral.    Assessment:   1. Pain due to onychomycosis of toenails of both feet   2. Controlled type 2 diabetes mellitus with other circulatory complication, with long-term current use of insulin (HCC)        Plan:  Patient was evaluated and treated and all questions answered. -Discussed and educated patient on diabetic foot care, especially with  regards to the vascular, neurological and musculoskeletal systems.  -Stressed the importance of good glycemic control and  the detriment of not  controlling glucose levels in relation to the foot. -Discussed supportive shoes at all times and checking feet regularly.  -Mechanically debrided all nails 1-5 right using sterile nail nipper and filed with dremel without incident  -Answered all patient questions -Patient to return  in 3 months for at risk foot care -Patient advised to call the office if any problems or questions arise in the meantime.   Louann Sjogren, DPM

## 2023-10-13 ENCOUNTER — Ambulatory Visit: Payer: Medicare Other | Admitting: Podiatry

## 2023-11-03 ENCOUNTER — Encounter: Payer: Self-pay | Admitting: Podiatry

## 2023-11-03 ENCOUNTER — Ambulatory Visit: Payer: Medicare Other | Admitting: Podiatry

## 2023-11-03 DIAGNOSIS — E1159 Type 2 diabetes mellitus with other circulatory complications: Secondary | ICD-10-CM | POA: Diagnosis not present

## 2023-11-03 DIAGNOSIS — M79674 Pain in right toe(s): Secondary | ICD-10-CM

## 2023-11-03 DIAGNOSIS — B351 Tinea unguium: Secondary | ICD-10-CM | POA: Diagnosis not present

## 2023-11-03 DIAGNOSIS — M79675 Pain in left toe(s): Secondary | ICD-10-CM | POA: Diagnosis not present

## 2023-11-03 DIAGNOSIS — Z794 Long term (current) use of insulin: Secondary | ICD-10-CM

## 2023-11-03 NOTE — Progress Notes (Signed)
  Subjective:  Patient ID: [name], male    DOB: 1951/04/03,   MRN: [id number]  No chief complaint on file.   73 y.o. male presents for concern of thickened elongated and painful nails that are difficult to trim. Requesting to have them trimmed today. Relates burning and tingling in their feet. Patient is diabetic and last A1c was  Lab Results  Component Value Date   HGBA1C 8.0 (H) 12/09/2022   .   PCP:  [name], MD    . Denies any other pedal complaints. Denies n/v/f/c.   Past Medical History:  Diagnosis Date   Cancer (HCC)    throat   Cigarette smoker    COPD (chronic obstructive pulmonary disease) (HCC)    Coronary artery disease    Diabetes mellitus    type 1  x 50 yrs   Diabetic retinopathy (HCC)    Dyslipidemia    Hypertension    Myocardial infarction (HCC)    "a long time ago" from insulin pump   Peripheral vascular disease (HCC)     Objective:  Physical Exam: Vascular: DP/PT pulses 2/4 bilateral. CFT <3 seconds. Absent hair growth on digits. Edema noted to bilateral lower extremities. Xerosis noted bilaterally.  Skin. No lacerations or abrasions bilateral feet. Nails 1-5 right  are thickened discolored and elongated with subungual debris.  Musculoskeletal: MMT 5/5 bilateral lower extremities in DF, PF, Inversion and Eversion. Deceased ROM in DF of ankle joint. 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  Neurological: Sensation intact to light touch. Protective sensation diminished bilateral.    Assessment:   1. Pain due to onychomycosis of toenails of both feet   2. Controlled type 2 diabetes mellitus with other circulatory complication, with long-term current use of insulin (HCC)         Plan:  Patient was evaluated and treated and all questions answered. -Discussed and educated patient on diabetic foot care, especially with  regards to the vascular, neurological and musculoskeletal systems.  -Stressed the importance of good glycemic control and  the detriment of not  controlling glucose levels in relation to the foot. -Discussed supportive shoes at all times and checking feet regularly.  -Mechanically debrided all nails 1-5 right using sterile nail nipper and filed with dremel without incident  -Answered all patient questions -Patient to return  in 3 months for at risk foot care -Patient advised to call the office if any problems or questions arise in the meantime.   Louann Sjogren, DPM

## 2023-12-22 ENCOUNTER — Telehealth: Payer: Self-pay | Admitting: Neurology

## 2023-12-22 NOTE — Telephone Encounter (Signed)
 MYC conf

## 2023-12-23 ENCOUNTER — Telehealth: Payer: Self-pay | Admitting: Neurology

## 2023-12-23 ENCOUNTER — Encounter: Payer: Self-pay | Admitting: Neurology

## 2023-12-23 ENCOUNTER — Ambulatory Visit: Admitting: Neurology

## 2023-12-23 VITALS — BP 111/54 | HR 84 | Resp 15 | Ht 71.0 in

## 2023-12-23 DIAGNOSIS — R29898 Other symptoms and signs involving the musculoskeletal system: Secondary | ICD-10-CM | POA: Diagnosis not present

## 2023-12-23 DIAGNOSIS — R269 Unspecified abnormalities of gait and mobility: Secondary | ICD-10-CM | POA: Diagnosis not present

## 2023-12-23 NOTE — Telephone Encounter (Signed)
No auth required sent to GI [phone]
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
## 2024-02-07 ENCOUNTER — Ambulatory Visit (INDEPENDENT_AMBULATORY_CARE_PROVIDER_SITE_OTHER): Admitting: Podiatry

## 2024-02-07 DIAGNOSIS — Z91199 Patient's noncompliance with other medical treatment and regimen due to unspecified reason: Secondary | ICD-10-CM

## 2024-02-07 NOTE — Progress Notes (Signed)
 No show

## 2024-02-09 ENCOUNTER — Ambulatory Visit: Admitting: Neurology

## 2024-02-09 ENCOUNTER — Telehealth: Payer: Self-pay | Admitting: Neurology

## 2024-02-09 ENCOUNTER — Encounter: Payer: Self-pay | Admitting: Neurology

## 2024-02-09 VITALS — BP 116/70 | Ht 71.0 in | Wt 156.0 lb

## 2024-02-09 DIAGNOSIS — G629 Polyneuropathy, unspecified: Secondary | ICD-10-CM | POA: Diagnosis not present

## 2024-02-09 DIAGNOSIS — R269 Unspecified abnormalities of gait and mobility: Secondary | ICD-10-CM | POA: Diagnosis not present

## 2024-02-09 DIAGNOSIS — R29898 Other symptoms and signs involving the musculoskeletal system: Secondary | ICD-10-CM | POA: Diagnosis not present
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
## 2024-02-09 NOTE — Telephone Encounter (Signed)
 no auth required sent to [facility] [phone]

## 2024-02-09 NOTE — Procedures (Signed)
 Full Name: [name] Gender: Male MRN #: [id number] Date of Birth: 12/27/1950    Visit Date: 02/09/2024 11:52 Age: 73 Years Examining Physician: Phebe Brasil Referring Physician: Phebe Brasil Height: 5 feet 8 inch History: 73 year old male presenting with progressive bilateral finger and hands numbness weakness, had a history of insulin -dependent type 1 diabetes for over 50 years, left BKA, chronic neck pain  Summary of the test: Nerve conduction study: Bilateral median, ulnar sensory motor responses were absent.  Left radial sensory response was absent.  Right radial sensory response showed significantly decreased snap amplitude.  Electromyography: Selected needle examination of right lower extremity muscles, bilateral upper extremity muscles and cervical paraspinal muscles were performed.  There is evidence of chronic neuropathic changes involving right distal leg muscles.  There is also evidence of active neuropathic changes involving bilateral distal upper extremity muscles, there was no evidence of active bilateral cervical radiculopathy   Conclusion: This is an abnormal study.  There is electrodiagnostic evidence of severe axonal sensorimotor polyneuropathy.  There is no evidence of active bilateral cervical radiculopathy.    ------------------------------- Phebe Brasil. M.D. Ph.D.   [facility] [address] [place], [place] [address] Tel: [phone] Fax: [phone]  Verbal informed consent was obtained from the patient, patient was informed of potential risk of procedure, including bruising, bleeding, hematoma formation, infection, muscle weakness, muscle pain, numbness, among others.        MNC    Nerve / Sites Muscle Latency Ref. Amplitude Ref. Rel Amp Segments Distance Ref. Area    ms ms mV mV %  cm m/s mVms  R Median - APB     Wrist APB NR <=4.4 NR >=4.0 NR Wrist - APB 7  NR     Upper arm APB 15.2  0.1   Upper arm -  Wrist  >=49 0.3  L Median - APB     Wrist APB NR <=4.4 NR >=4.0 NR Wrist - APB 7  NR  R Ulnar - ADM     Wrist ADM NR <=3.3 NR >=6.0 NR Wrist - ADM 7  NR     B.Elbow ADM 8.5  0.3   B.Elbow - Wrist  >=49 0.4  L Ulnar - ADM     Wrist ADM NR <=3.3 NR >=6.0 NR Wrist - ADM 7  NR     B.Elbow ADM 12.0  0.2   B.Elbow - Wrist  >=49 0.9             SNC    Nerve / Sites Rec. Site Peak Lat Ref.  Amp Ref. Segments Distance    ms ms V V  cm  R Radial - Anatomical snuff box (Forearm)     Forearm Wrist 3.1 <=2.9 4 >=15 Forearm - Wrist 10  L Radial - Anatomical snuff box (Forearm)     Forearm Wrist NR <=2.9 NR >=15 Forearm - Wrist 10  L Median - Orthodromic (Dig II, Mid palm)     Dig II Wrist NR <=3.4 NR >=10 Dig II - Wrist 13  R Median - Orthodromic (Dig II, Mid palm)     Dig II Wrist NR <=3.4 NR >=10 Dig II - Wrist 13  L Ulnar - Orthodromic, (Dig V, Mid palm)     Dig V Wrist NR <=3.1 NR >=5 Dig V - Wrist 11  R Ulnar - Orthodromic, (Dig V, Mid palm)     Dig V Wrist NR <=3.1 NR >=5  Dig V - Wrist 11                 EMG Summary Table    Spontaneous MUAP Recruitment  Muscle IA Fib PSW Fasc Other Amp Dur. Poly Pattern  R. Tibialis anterior Increased None None None _______ Increased Increased 1+ Reduced  R. Tibialis posterior Increased None None None _______ Increased Increased 1+ Reduced  R. Peroneus longus Increased None None None _______ Increased Increased 1+ Reduced  R. Gastrocnemius (Medial head) Increased None None None _______ Increased Increased 1+ Reduced  R. Vastus lateralis Normal None None None _______ Normal Normal Normal Reduced  R. First dorsal interosseous Increased 1+ 1+ None _______ Increased Increased 1+ Reduced  R. Abductor digiti minimi (manus) Increased 1+ None None _______ Increased Increased 1+ Reduced  R. Pronator teres Normal None None None _______ Normal Normal Normal Normal  R. Biceps brachii Normal None None None _______ Normal Normal Normal Normal  R. Deltoid Normal  None None None _______ Normal Normal Normal Normal  R. Triceps brachii Normal None None None _______ Normal Normal Normal Normal  L. First dorsal interosseous Increased None None None _______ Increased Increased 1+ Reduced  L. Abductor pollicis brevis Increased None None None _______ Increased Increased 1+ Reduced  L. Pronator teres Normal None None None _______ Normal Normal Normal Normal  L. Extensor digitorum communis Normal None None None _______ Normal Normal Normal Reduced  L. Biceps brachii Normal None None None _______ Normal Normal Normal Normal  L. Deltoid Normal None None None _______ Normal Normal Normal Normal  R. Cervical paraspinals Normal None None None _______ Normal Normal Normal Normal

## 2024-02-10 LAB — COMPREHENSIVE METABOLIC PANEL WITH GFR
AST: 16 IU/L (ref 0–40)
Alkaline Phosphatase: 116 IU/L (ref 44–121)
BUN/Creatinine Ratio: 25 — ABNORMAL HIGH (ref 10–24)
BUN: 24 mg/dL (ref 8–27)
CO2: 20 mmol/L (ref 20–29)
Calcium: 8.9 mg/dL (ref 8.6–10.2)
Creatinine, Ser: 0.95 mg/dL (ref 0.76–1.27)
Total Protein: 6.2 g/dL (ref 6.0–8.5)

## 2024-02-10 LAB — VITAMIN B12: Vitamin B-12: 928 pg/mL (ref 232–1245)

## 2024-02-10 LAB — CBC WITH DIFFERENTIAL/PLATELET
Basophils Absolute: 0 10*3/uL (ref 0.0–0.2)
EOS (ABSOLUTE): 0.1 10*3/uL (ref 0.0–0.4)
Lymphocytes Absolute: 1 10*3/uL (ref 0.7–3.1)
MCV: 100 fL — ABNORMAL HIGH (ref 79–97)
Monocytes Absolute: 1 10*3/uL — ABNORMAL HIGH (ref 0.1–0.9)
Neutrophils: 78 %
Platelets: 377 10*3/uL (ref 150–450)
WBC: 9.3 10*3/uL (ref 3.4–10.8)

## 2024-02-10 LAB — FOLATE: Folate: >20 ng/mL (ref >3.0)

## 2024-02-10 LAB — HGB A1C W/O EAG: Hgb A1c MFr Bld: 8.3 % — ABNORMAL HIGH (ref 4.8–5.6)

## 2024-02-10 LAB — TSH: TSH: 2.55 u[IU]/mL (ref 0.450–4.500)

## 2024-02-12 LAB — CBC WITH DIFFERENTIAL/PLATELET
Basos: 0 %
Eos: 1 %
Hematocrit: 39.6 % (ref 37.5–51.0)
Hemoglobin: 12.9 g/dL — ABNORMAL LOW (ref 13.0–17.7)
Immature Grans (Abs): 0 10*3/uL (ref 0.0–0.1)
Immature Granulocytes: 0 %
Lymphs: 10 %
MCH: 32.5 pg (ref 26.6–33.0)
MCHC: 32.6 g/dL (ref 31.5–35.7)
Monocytes: 11 %
Neutrophils Absolute: 7.2 10*3/uL — ABNORMAL HIGH (ref 1.4–7.0)
RBC: 3.97 x10E6/uL — ABNORMAL LOW (ref 4.14–5.80)
RDW: 13 % (ref 11.6–15.4)

## 2024-02-12 LAB — MULTIPLE MYELOMA PANEL, SERUM
Albumin SerPl Elph-Mcnc: 3.4 g/dL (ref 2.9–4.4)
Albumin/Glob SerPl: 1.3 (ref 0.7–1.7)
Alpha 1: 0.2 g/dL (ref 0.0–0.4)
Alpha2 Glob SerPl Elph-Mcnc: 0.9 g/dL (ref 0.4–1.0)
B-Globulin SerPl Elph-Mcnc: 1 g/dL (ref 0.7–1.3)
Gamma Glob SerPl Elph-Mcnc: 0.7 g/dL (ref 0.4–1.8)
Globulin, Total: 2.8 g/dL (ref 2.2–3.9)
IgA/Immunoglobulin A, Serum: 278 mg/dL (ref 61–437)
IgG (Immunoglobin G), Serum: 776 mg/dL (ref 603–1613)
IgM (Immunoglobulin M), Srm: 85 mg/dL (ref 15–143)

## 2024-02-12 LAB — CK: Total CK: 82 U/L (ref 41–331)

## 2024-02-12 LAB — RPR: RPR Ser Ql: NONREACTIVE

## 2024-02-12 LAB — COMPREHENSIVE METABOLIC PANEL WITH GFR
ALT: 19 IU/L (ref 0–44)
Albumin: 3.7 g/dL — ABNORMAL LOW (ref 3.8–4.8)
Bilirubin Total: 0.2 mg/dL (ref 0.0–1.2)
Chloride: 100 mmol/L (ref 96–106)
Globulin, Total: 2.5 g/dL (ref 1.5–4.5)
Glucose: 397 mg/dL — ABNORMAL HIGH (ref 70–99)
Potassium: 5.5 mmol/L — ABNORMAL HIGH (ref 3.5–5.2)
Sodium: 133 mmol/L — ABNORMAL LOW (ref 134–144)
eGFR: 86 mL/min/{1.73_m2} (ref >59)

## 2024-02-12 LAB — C-REACTIVE PROTEIN: CRP: 1 mg/L (ref 0–10)

## 2024-02-12 LAB — ANA W/REFLEX IF POSITIVE: Anti Nuclear Antibody (ANA): NEGATIVE

## 2024-02-12 LAB — LYME IGG/IGM
Lyme IgG EIA: NEGATIVE
Lyme IgM EIA: NEGATIVE

## 2024-02-12 LAB — LYME DISEASE SEROLOGY W/REFLEX: Lyme Total Antibody EIA: UNDETERMINED

## 2024-02-12 LAB — HIV ANTIBODY (ROUTINE TESTING W REFLEX): HIV Screen 4th Generation wRfx: NONREACTIVE

## 2024-02-14 ENCOUNTER — Ambulatory Visit: Payer: Self-pay | Admitting: Neurology

## 2024-02-15 ENCOUNTER — Other Ambulatory Visit: Payer: Self-pay | Admitting: Neurology

## 2024-02-15 ENCOUNTER — Encounter: Payer: Self-pay | Admitting: Podiatry

## 2024-02-15 ENCOUNTER — Encounter: Payer: Self-pay | Admitting: Neurology

## 2024-02-15 ENCOUNTER — Ambulatory Visit: Admitting: Podiatry

## 2024-02-15 DIAGNOSIS — B351 Tinea unguium: Secondary | ICD-10-CM | POA: Diagnosis not present

## 2024-02-15 DIAGNOSIS — M79675 Pain in left toe(s): Secondary | ICD-10-CM | POA: Diagnosis not present

## 2024-02-15 DIAGNOSIS — E1159 Type 2 diabetes mellitus with other circulatory complications: Secondary | ICD-10-CM

## 2024-02-15 DIAGNOSIS — S00259A Superficial foreign body of unspecified eyelid and periocular area, initial encounter: Secondary | ICD-10-CM

## 2024-02-15 DIAGNOSIS — Z794 Long term (current) use of insulin: Secondary | ICD-10-CM | POA: Diagnosis not present

## 2024-02-15 DIAGNOSIS — M79674 Pain in right toe(s): Secondary | ICD-10-CM

## 2024-02-15 NOTE — Progress Notes (Signed)
  Subjective:  Patient ID: [name], male    DOB: 1951-07-09,   MRN: [id number]  Chief Complaint  Patient presents with   Nail Problem    Patient is here for RFC    73 y.o. male presents for concern of thickened elongated and painful nails that are difficult to trim. Requesting to have them trimmed today. Relates burning and tingling in their feet. Patient is diabetic and last A1c was  Lab Results  Component Value Date   HGBA1C 8.3 (H) 02/09/2024   .   PCP:  Onita Rush, MD    . Denies any other pedal complaints. Denies n/v/f/c.   Past Medical History:  Diagnosis Date   Cancer (HCC)    throat   Cigarette smoker    COPD (chronic obstructive pulmonary disease) (HCC)    Coronary artery disease    Diabetes mellitus    type 1  x 50 yrs   Diabetic retinopathy (HCC)    Dyslipidemia    Hypertension    Myocardial infarction (HCC)    a long time ago from insulin  pump   Peripheral vascular disease (HCC)     Objective:  Physical Exam: Vascular: DP/PT pulses 2/4 bilateral. CFT <3 seconds. Absent hair growth on digits. Edema noted to bilateral lower extremities. Xerosis noted bilaterally.  Skin. No lacerations or abrasions bilateral feet. Nails 1-5 right  are thickened discolored and elongated with subungual debris.  Musculoskeletal: MMT 5/5 bilateral lower extremities in DF, PF, Inversion and Eversion. Deceased ROM in DF of ankle joint. 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  Neurological: Sensation intact to light touch. Protective sensation diminished bilateral.    Assessment:   1. Pain due to onychomycosis of toenails of both feet   2. Controlled type 2 diabetes mellitus with other circulatory complication, with long-term current use of insulin  (HCC)         Plan:  Patient was evaluated and treated and all questions answered. -Discussed and educated patient on diabetic foot care, especially with  regards to the vascular, neurological and musculoskeletal systems.   -Stressed the importance of good glycemic control and the detriment of not  controlling glucose levels in relation to the foot. -Discussed supportive shoes at all times and checking feet regularly.  -Mechanically debrided all nails 1-5 right using sterile nail nipper and filed with dremel without incident  -Answered all patient questions -Patient to return  in 3 months for at risk foot care -Patient advised to call the office if any problems or questions arise in the meantime.   Asberry Failing, DPM

## 2024-02-17 NOTE — Progress Notes (Signed)
 Return visit.   Cancer site: Larynx Stage: T3 N0 Treatment type: Total laryngectomy bilateral neck dissection Date of treatment: June 2024 Smoking status: Not  Otolaryngology Office Note  HPI:    [name] is a 73 y.o. male who presents as a return Patient.    Current problem: Laryngeal cancer   HPI: Doing well, no complaints.  1 year posttreatment.  PMH/Meds/All/SocHx/FamHx/ROS:   Medical History[1]  Surgical History[2]  No family history of bleeding disorders, wound healing problems or difficulty with anesthesia.      Current Medications[3]      Physical Exam:      Healthy-appearing no distress.  Breathing is clear.  Voice is excellent with TEP.  Air filter in place.  Nasal exam is unremarkable.  Oral cavity and pharynx are healthy and clear.  Indirect exam of the neopharynx is healthy and clear.  No palpable neck masses.  Stoma is widely patent.   Independent Review of Additional Tests or Records:  none  Procedures:  none   Impression & Plans:  Stable, no evidence of recurrence.  Follow-up 3 months or sooner as needed.  Medical Decision Making: #/Compl Problems  2 Data Rev  1  Management 2  (1-Straightforward, 2-Low, 3- Moderate, 4-High)   Ida VEAR Loader, MD       [1] Past Medical History: Diagnosis Date  . Cataract   . Diabetes mellitus    (CMD)    Med Hx: Diabetes mellitus;   . Diabetic retinopathy    (CMD)   . History of leg amputation    (CMD)   . Hypertension   [2] Past Surgical History: Procedure Laterality Date  . CATARACT EXTRACTION     Procedure: CATARACT EXTRACTION  . LEG SURGERY     Procedure: LEG SURGERY; x3  [3]  Current Outpatient Medications:  .  acetaminophen  (TYLENOL ) 325 mg tablet, Take 650 mg by mouth., Disp: , Rfl:  .  albuterol  HFA (PROVENTIL  HFA;VENTOLIN  HFA;PROAIR  HFA) 90 mcg/actuation inhaler, Inhale 1-2 puffs., Disp: , Rfl:  .  amLODIPine  (NORVASC ) 5 mg tablet, Once Daily., Disp: , Rfl:  .  aspirin  81  mg EC tablet, Take 81 mg by mouth Once Daily., Disp: , Rfl:  .  baclofen  (LIORESAL ) 20 mg tablet, Take 1 tablet po q daily prn Orally as directed for 90 days, Disp: , Rfl:  .  folic acid  (FOLVITE ) 800 mcg tablet, Take 800 mcg by mouth Once Daily., Disp: , Rfl:  .  gabapentin (NEURONTIN) 100 mg capsule, , Disp: , Rfl: 0 .  HumaLOG U-100 Insulin  100 unit/mL injection, , Disp: , Rfl:  .  ibuprofen (MOTRIN) 400 mg tablet, Take 1 tablet by mouth in the morning and 1 tablet at noon and 1 tablet in the evening. Take with meals., Disp: , Rfl:  .  insulin  aspart U-100 (NovoLOG  U-100 Insulin  aspart) 100 unit/mL injection, Inject  under the skin 3 (three) times a day before meals., Disp: , Rfl:  .  ipratropium-albuteroL  (DUO-NEB) 0.5-2.5 mg/3 mL nebulizer solution, 3 mL., Disp: , Rfl:  .  meloxicam (MOBIC) 15 mg tablet, , Disp: , Rfl:  .  methocarbamoL  (ROBAXIN ) 500 mg tablet, Take 1 tablet as needed by oral route in the evening for 40 days., Disp: , Rfl:  .  metoprolol  succinate (TOPROL  XL) 25 mg 24 hr tablet, Take 25 mg by mouth Once Daily., Disp: , Rfl:  .  multivitamin cap, Take 1 capsule by mouth Once Daily., Disp: , Rfl:  .  ondansetron  (ZOFRAN ) 4 mg tablet, Take 1 tablet by mouth daily., Disp: , Rfl:  .  oxyCODONE  (ROXICODONE ) 5 mg immediate release tablet, Take 5 mg by mouth every 4 (four) hours as needed., Disp: , Rfl:  .  pravastatin  (PRAVACHOL ) 40 mg tablet, Take  by mouth., Disp: , Rfl:  .  ramipriL  (ALTACE ) 10 mg capsule, Take 10 mg by mouth Once Daily., Disp: , Rfl:  .  rosuvastatin  (CRESTOR ) 10 mg tablet, Take 1 tablet po q daily Orally Once a day, Disp: , Rfl:  .  sertraline  (ZOLOFT ) 50 mg tablet, Take 50 mg by mouth nightly., Disp: , Rfl:

## 2024-02-23 ENCOUNTER — Ambulatory Visit
Admission: RE | Admit: 2024-02-23 | Discharge: 2024-02-23 | Disposition: A | Discharge status: Home / Self Care | Source: Ambulatory Visit | Attending: Neurology | Admitting: Neurology

## 2024-02-23 DIAGNOSIS — R269 Unspecified abnormalities of gait and mobility: Secondary | ICD-10-CM | POA: Diagnosis not present

## 2024-02-23 DIAGNOSIS — G629 Polyneuropathy, unspecified: Secondary | ICD-10-CM

## 2024-02-23 DIAGNOSIS — R29898 Other symptoms and signs involving the musculoskeletal system: Secondary | ICD-10-CM

## 2024-02-23 DIAGNOSIS — S00259A Superficial foreign body of unspecified eyelid and periocular area, initial encounter: Secondary | ICD-10-CM

## 2024-05-17 ENCOUNTER — Encounter: Payer: Self-pay | Admitting: Podiatry

## 2024-05-17 ENCOUNTER — Ambulatory Visit (INDEPENDENT_AMBULATORY_CARE_PROVIDER_SITE_OTHER): Admitting: Podiatry

## 2024-05-17 DIAGNOSIS — M79675 Pain in left toe(s): Secondary | ICD-10-CM | POA: Diagnosis not present

## 2024-05-17 DIAGNOSIS — M79674 Pain in right toe(s): Secondary | ICD-10-CM | POA: Diagnosis not present

## 2024-05-17 DIAGNOSIS — E1159 Type 2 diabetes mellitus with other circulatory complications: Secondary | ICD-10-CM | POA: Diagnosis not present

## 2024-05-17 DIAGNOSIS — B351 Tinea unguium: Secondary | ICD-10-CM | POA: Diagnosis not present

## 2024-05-17 DIAGNOSIS — Z794 Long term (current) use of insulin: Secondary | ICD-10-CM | POA: Diagnosis not present

## 2024-05-17 NOTE — Progress Notes (Signed)
  Subjective:  Patient ID: [name], male    DOB: Oct 17, 1950,   MRN: [id number]  Chief Complaint  Patient presents with   Diabetes    Trim Saw Dr. Norleen Jungling - 03/26/2024; A1c - 8.3    73 y.o. male presents for concern of thickened elongated and painful nails that are difficult to trim. Requesting to have them trimmed today. Relates burning and tingling in their feet. Patient is diabetic and last A1c was  Lab Results  Component Value Date   HGBA1C 8.3 (H) 02/09/2024   .   PCP:  Jungling Norleen, MD    . Denies any other pedal complaints. Denies n/v/f/c.   Past Medical History:  Diagnosis Date   Cancer (HCC)    throat   Cigarette smoker    COPD (chronic obstructive pulmonary disease) (HCC)    Coronary artery disease    Diabetes mellitus    type 1  x 50 yrs   Diabetic retinopathy (HCC)    Dyslipidemia    Hypertension    Myocardial infarction (HCC)    a long time ago from insulin  pump   Peripheral vascular disease     Objective:  Physical Exam: Vascular: DP/PT pulses 2/4 bilateral. CFT <3 seconds. Absent hair growth on digits. Edema noted to bilateral lower extremities. Xerosis noted bilaterally.  Skin. No lacerations or abrasions bilateral feet. Nails 1-5 right  are thickened discolored and elongated with subungual debris.  Musculoskeletal: MMT 5/5 bilateral lower extremities in DF, PF, Inversion and Eversion. Deceased ROM in DF of ankle joint. 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  Neurological: Sensation intact to light touch. Protective sensation diminished bilateral.    Assessment:   1. Pain due to onychomycosis of toenails of both feet   2. Controlled type 2 diabetes mellitus with other circulatory complication, with long-term current use of insulin          Plan:  Patient was evaluated and treated and all questions answered. -Discussed and educated patient on diabetic foot care, especially with  regards to the vascular, neurological and  musculoskeletal systems.  -Stressed the importance of good glycemic control and the detriment of not  controlling glucose levels in relation to the foot. -Discussed supportive shoes at all times and checking feet regularly.  -Mechanically debrided all nails 1-5 right using sterile nail nipper and filed with dremel without incident  -Answered all patient questions -Patient to return  in 3 months for at risk foot care -Patient advised to call the office if any problems or questions arise in the meantime.   Asberry Failing, DPM

## 2024-05-30 ENCOUNTER — Telehealth: Payer: Self-pay | Admitting: Neurology

## 2024-05-30 NOTE — Telephone Encounter (Signed)
 ERROR

## 2024-05-30 NOTE — Telephone Encounter (Signed)
 Patient can't speak - He never heard form Dr. Onita or a nurse regarding his MRI results back on February 23, 2024. He would like you to call his daughter Silvano to advise and what's next.

## 2024-05-30 NOTE — Telephone Encounter (Signed)
 Call to patient, and both daughters listed. No answer and unable to leave message

## 2024-05-31 NOTE — Telephone Encounter (Signed)
 error

## 2024-05-31 NOTE — Telephone Encounter (Signed)
 Unable to lvm by hf 05/31/24

## 2024-06-01 NOTE — Telephone Encounter (Signed)
 3rd attempt unable to leave msg and closing out for no contact

## 2024-06-29 NOTE — Telephone Encounter (Signed)
 Patient is in the lobby and he is unable to speak so he was asking if there is a way to communicate via text or email. I will see if Haleigh or April can speak to him today.

## 2024-06-29 NOTE — Telephone Encounter (Signed)
 Patient came in and is nonverbal. Uses a whiteboard to communicate. I reviewed MRI cervical spine results with patient. He states that he has decreased mobility in both shoulders and both thumbs. He denies any tingling, numbness., or pain. 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    I spoke with Dr. Onita and this seems to be more orthopedic vs neurological and advised to have him continue to follow up with orthopedist . Patient gave written understanding and agreeable

## 2024-08-08 ENCOUNTER — Encounter: Payer: Self-pay | Admitting: Registered Nurse

## 2024-08-08 DIAGNOSIS — R9389 Abnormal findings on diagnostic imaging of other specified body structures: Secondary | ICD-10-CM

## 2024-08-15 ENCOUNTER — Ambulatory Visit: Admitting: Podiatry

## 2024-08-15 ENCOUNTER — Encounter: Payer: Self-pay | Admitting: Podiatry

## 2024-08-15 DIAGNOSIS — E1142 Type 2 diabetes mellitus with diabetic polyneuropathy: Secondary | ICD-10-CM | POA: Diagnosis not present

## 2024-08-15 DIAGNOSIS — I739 Peripheral vascular disease, unspecified: Secondary | ICD-10-CM

## 2024-08-15 DIAGNOSIS — M79674 Pain in right toe(s): Secondary | ICD-10-CM | POA: Diagnosis not present

## 2024-08-15 DIAGNOSIS — B351 Tinea unguium: Secondary | ICD-10-CM

## 2024-08-15 DIAGNOSIS — M79675 Pain in left toe(s): Secondary | ICD-10-CM | POA: Diagnosis not present

## 2024-08-15 DIAGNOSIS — Z89512 Acquired absence of left leg below knee: Secondary | ICD-10-CM

## 2024-08-15 NOTE — Progress Notes (Signed)
"  °  Subjective:  Patient ID: [name], male    DOB: [birth year],  MRN: [id number]  [name] presents to clinic today for at risk foot care. Pt has h/o NIDDM and PAD with h/o left BKA and painful thick toenails that are difficult to trim. Pain interferes with ambulation. Aggravating factors include wearing enclosed shoe gear. Pain is relieved with periodic professional debridement.  Chief Complaint  Patient presents with   Diabetes    DFC IDDM A1C 8.3 Right foot toenail trim. LOV with PCP 07/31/24.   New problem(s): None.   PCP is Onita Rush, MD.  Allergies[1]  Review of Systems: Negative except as noted in the HPI.  Objective: No changes noted in today's physical examination. There were no vitals filed for this visit. [name] is a pleasant 73 y.o. male thin build in NAD. AAO x 3.  Vascular Examination: CFT <4 seconds.. DP pulses diminished RLE. PT pulses diminished RLE. Digital hair absent. Skin temperature gradient warm to cool RLE. No ischemia or gangrene. No cyanosis or clubbing noted.    Neurological Examination: Pt has subjective symptoms of neuropathy. Protective sensation diminished with 10 gram monofilament right lower extremity.  Dermatological Examination: Pedal skin thin, shiny and atrophic.. No open wounds. No interdigital macerations.   Toenails 1-5 right thick, discolored, elongated with subungual debris and pain on dorsal palpation.   No hyperkeratotic nor porokeratotic lesions.  Musculoskeletal Examination: Muscle strength 5/5 to all LE muscle groups of right lower extremity. Lower extremity amputation(s): below knee amputation left lower extremity. No pain, crepitus or joint limitation noted with ROM right lower extremity.  Radiographs: None  Last A1c:      Latest Ref Rng & Units 02/09/2024   12:30 PM  Hemoglobin A1C  Hemoglobin-A1c 4.8 - 5.6 % 8.3    Assessment/Plan: 1. Pain due to onychomycosis of toenails of both feet   2. Status  post below knee amputation, left (HCC)   3. PVD (peripheral vascular disease) s/p R fem-pop bypass and L BKA   4. Type II diabetes mellitus with peripheral circulatory disorder (HCC)   5. Diabetic peripheral neuropathy associated with type 2 diabetes mellitus (HCC)   Patient was evaluated and treated. All patient's and/or POA's questions/concerns addressed on today's visit. Toenails 1-5 right foot debrided in length and girth without incident. Continue foot and shoe inspections daily. Monitor blood glucose per PCP/Endocrinologist's recommendations. Continue soft, supportive shoe gear daily. Report any pedal injuries to medical professional. Call office if there are any questions/concerns. -Patient/POA to call should there be question/concern in the interim.   Return in about 3 months (around 11/13/2024).  [name], DPM      [place] LOCATION: [address] [place] [address]                   Office [phone]   [place] LOCATION: [address] [place], [place] [address] Office [phone]     [1]  Allergies Allergen Reactions   Simvastatin Other (See Comments)    MYALGIAS, MUSCLE WEAKNESS    "

## 2024-11-29 ENCOUNTER — Ambulatory Visit: Admitting: Podiatry
